# Patient Record
Sex: Female | Born: 1940 | Race: White | Hispanic: No | Smoking: Never smoker
Health system: Southern US, Community
[De-identification: ages and names within clinical notes are randomized; demographics above are authoritative.]

## PROBLEM LIST (undated history)

## (undated) DIAGNOSIS — G252 Other specified forms of tremor: Secondary | ICD-10-CM

## (undated) DIAGNOSIS — I739 Peripheral vascular disease, unspecified: Secondary | ICD-10-CM

## (undated) DIAGNOSIS — I10 Essential (primary) hypertension: Secondary | ICD-10-CM

## (undated) DIAGNOSIS — K219 Gastro-esophageal reflux disease without esophagitis: Secondary | ICD-10-CM

## (undated) DIAGNOSIS — U071 COVID-19: Secondary | ICD-10-CM

## (undated) DIAGNOSIS — K222 Esophageal obstruction: Secondary | ICD-10-CM

## (undated) DIAGNOSIS — K2970 Gastritis, unspecified, without bleeding: Secondary | ICD-10-CM

## (undated) DIAGNOSIS — M545 Low back pain, unspecified: Secondary | ICD-10-CM

## (undated) DIAGNOSIS — M797 Fibromyalgia: Secondary | ICD-10-CM

## (undated) DIAGNOSIS — A0472 Enterocolitis due to Clostridium difficile, not specified as recurrent: Secondary | ICD-10-CM

## (undated) DIAGNOSIS — R413 Other amnesia: Secondary | ICD-10-CM

## (undated) DIAGNOSIS — C801 Malignant (primary) neoplasm, unspecified: Secondary | ICD-10-CM

## (undated) DIAGNOSIS — J189 Pneumonia, unspecified organism: Secondary | ICD-10-CM

## (undated) DIAGNOSIS — K279 Peptic ulcer, site unspecified, unspecified as acute or chronic, without hemorrhage or perforation: Secondary | ICD-10-CM

## (undated) DIAGNOSIS — B9681 Helicobacter pylori [H. pylori] as the cause of diseases classified elsewhere: Secondary | ICD-10-CM

## (undated) DIAGNOSIS — M479 Spondylosis, unspecified: Secondary | ICD-10-CM

## (undated) HISTORY — DX: Low back pain: M54.5

## (undated) HISTORY — DX: Spondylosis, unspecified: M47.9

## (undated) HISTORY — DX: Esophageal obstruction: K22.2

## (undated) HISTORY — DX: Fibromyalgia: M79.7

## (undated) HISTORY — DX: Peripheral vascular disease, unspecified: I73.9

## (undated) HISTORY — DX: Peptic ulcer, site unspecified, unspecified as acute or chronic, without hemorrhage or perforation: K27.9

## (undated) HISTORY — DX: Helicobacter pylori (H. pylori) as the cause of diseases classified elsewhere: B96.81

## (undated) HISTORY — DX: Other specified forms of tremor: G25.2

## (undated) HISTORY — DX: COVID-19: U07.1

## (undated) HISTORY — DX: Helicobacter pylori (H. pylori) as the cause of diseases classified elsewhere: K29.70

## (undated) HISTORY — DX: Low back pain, unspecified: M54.50

## (undated) HISTORY — DX: Enterocolitis due to Clostridium difficile, not specified as recurrent: A04.72

---
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
## 1972-08-12 HISTORY — PX: BREAST ENHANCEMENT SURGERY: SHX7

## 1982-08-12 HISTORY — PX: TUBAL LIGATION: SHX77

## 1983-08-13 HISTORY — PX: ABDOMINAL HYSTERECTOMY: SHX81

## 1994-01-16 ENCOUNTER — Encounter: Payer: Self-pay | Admitting: Internal Medicine

## 1998-01-12 ENCOUNTER — Emergency Department (HOSPITAL_COMMUNITY): Admission: EM | Admit: 1998-01-12 | Discharge: 1998-01-12 | Payer: Self-pay | Admitting: Emergency Medicine

## 2002-12-01 ENCOUNTER — Inpatient Hospital Stay (HOSPITAL_COMMUNITY): Admission: RE | Admit: 2002-12-01 | Discharge: 2002-12-05 | Payer: Self-pay | Admitting: Neurosurgery

## 2003-03-13 HISTORY — PX: LUMBAR FUSION: SHX111

## 2003-08-15 ENCOUNTER — Encounter (INDEPENDENT_AMBULATORY_CARE_PROVIDER_SITE_OTHER): Payer: Self-pay | Admitting: *Deleted

## 2003-08-15 LAB — CONVERTED CEMR LAB

## 2004-07-19 ENCOUNTER — Ambulatory Visit: Payer: Self-pay | Admitting: Internal Medicine

## 2004-09-07 ENCOUNTER — Ambulatory Visit: Payer: Self-pay | Admitting: Internal Medicine

## 2004-09-19 ENCOUNTER — Ambulatory Visit: Payer: Self-pay | Admitting: Internal Medicine

## 2005-06-07 ENCOUNTER — Ambulatory Visit: Payer: Self-pay | Admitting: Internal Medicine

## 2005-11-05 ENCOUNTER — Ambulatory Visit: Payer: Self-pay | Admitting: Internal Medicine

## 2005-11-25 ENCOUNTER — Ambulatory Visit: Payer: Self-pay | Admitting: Internal Medicine

## 2005-12-30 ENCOUNTER — Ambulatory Visit: Payer: Self-pay | Admitting: Internal Medicine

## 2006-01-09 ENCOUNTER — Ambulatory Visit: Payer: Self-pay | Admitting: Internal Medicine

## 2006-06-13 ENCOUNTER — Ambulatory Visit: Payer: Self-pay | Admitting: Internal Medicine

## 2006-06-18 ENCOUNTER — Ambulatory Visit: Payer: Self-pay | Admitting: Internal Medicine

## 2007-05-04 ENCOUNTER — Encounter: Payer: Self-pay | Admitting: *Deleted

## 2007-05-04 DIAGNOSIS — M1990 Unspecified osteoarthritis, unspecified site: Secondary | ICD-10-CM | POA: Insufficient documentation

## 2007-05-04 DIAGNOSIS — M797 Fibromyalgia: Secondary | ICD-10-CM | POA: Insufficient documentation

## 2007-05-04 DIAGNOSIS — R1013 Epigastric pain: Secondary | ICD-10-CM

## 2007-05-04 DIAGNOSIS — K3189 Other diseases of stomach and duodenum: Secondary | ICD-10-CM | POA: Insufficient documentation

## 2007-05-04 DIAGNOSIS — I739 Peripheral vascular disease, unspecified: Secondary | ICD-10-CM | POA: Insufficient documentation

## 2007-05-04 DIAGNOSIS — M545 Low back pain: Secondary | ICD-10-CM

## 2007-05-26 ENCOUNTER — Ambulatory Visit: Payer: Self-pay | Admitting: Pulmonary Disease

## 2007-06-10 ENCOUNTER — Ambulatory Visit: Payer: Self-pay | Admitting: Internal Medicine

## 2007-08-18 ENCOUNTER — Telehealth: Payer: Self-pay | Admitting: Internal Medicine

## 2007-10-05 ENCOUNTER — Telehealth: Payer: Self-pay | Admitting: Internal Medicine

## 2007-12-07 ENCOUNTER — Ambulatory Visit: Payer: Self-pay | Admitting: Internal Medicine

## 2007-12-28 ENCOUNTER — Telehealth: Payer: Self-pay | Admitting: Internal Medicine

## 2007-12-28 DIAGNOSIS — R109 Unspecified abdominal pain: Secondary | ICD-10-CM | POA: Insufficient documentation

## 2008-01-05 ENCOUNTER — Ambulatory Visit: Payer: Self-pay | Admitting: Internal Medicine

## 2008-01-05 ENCOUNTER — Encounter: Payer: Self-pay | Admitting: Internal Medicine

## 2008-01-14 ENCOUNTER — Encounter: Payer: Self-pay | Admitting: Internal Medicine

## 2008-01-21 DIAGNOSIS — K5909 Other constipation: Secondary | ICD-10-CM | POA: Insufficient documentation

## 2008-01-21 DIAGNOSIS — Z8719 Personal history of other diseases of the digestive system: Secondary | ICD-10-CM | POA: Insufficient documentation

## 2008-01-21 DIAGNOSIS — K279 Peptic ulcer, site unspecified, unspecified as acute or chronic, without hemorrhage or perforation: Secondary | ICD-10-CM | POA: Insufficient documentation

## 2008-01-25 ENCOUNTER — Ambulatory Visit: Payer: Self-pay | Admitting: Internal Medicine

## 2008-01-25 DIAGNOSIS — R1013 Epigastric pain: Secondary | ICD-10-CM | POA: Insufficient documentation

## 2008-02-24 ENCOUNTER — Ambulatory Visit: Payer: Self-pay | Admitting: Internal Medicine

## 2008-02-24 LAB — HM COLONOSCOPY

## 2008-02-26 ENCOUNTER — Ambulatory Visit (HOSPITAL_COMMUNITY): Admission: RE | Admit: 2008-02-26 | Discharge: 2008-02-26 | Payer: Self-pay | Admitting: Internal Medicine

## 2008-06-10 ENCOUNTER — Encounter: Payer: Self-pay | Admitting: Internal Medicine

## 2008-06-20 ENCOUNTER — Telehealth: Payer: Self-pay | Admitting: Internal Medicine

## 2008-07-01 ENCOUNTER — Telehealth: Payer: Self-pay | Admitting: Internal Medicine

## 2008-07-06 ENCOUNTER — Telehealth: Payer: Self-pay | Admitting: Internal Medicine

## 2008-08-08 ENCOUNTER — Telehealth: Payer: Self-pay | Admitting: Internal Medicine

## 2008-08-17 ENCOUNTER — Encounter: Payer: Self-pay | Admitting: Internal Medicine

## 2008-08-18 ENCOUNTER — Telehealth: Payer: Self-pay | Admitting: Internal Medicine

## 2008-08-22 ENCOUNTER — Ambulatory Visit: Payer: Self-pay | Admitting: Internal Medicine

## 2008-08-22 DIAGNOSIS — G250 Essential tremor: Secondary | ICD-10-CM | POA: Insufficient documentation

## 2008-08-22 DIAGNOSIS — G252 Other specified forms of tremor: Secondary | ICD-10-CM

## 2008-12-26 ENCOUNTER — Telehealth: Payer: Self-pay | Admitting: Internal Medicine

## 2009-02-10 ENCOUNTER — Telehealth: Payer: Self-pay | Admitting: Internal Medicine

## 2009-03-09 ENCOUNTER — Telehealth: Payer: Self-pay | Admitting: Internal Medicine

## 2009-03-19 ENCOUNTER — Observation Stay (HOSPITAL_COMMUNITY): Admission: EM | Admit: 2009-03-19 | Discharge: 2009-03-20 | Payer: Self-pay | Admitting: Emergency Medicine

## 2009-03-21 ENCOUNTER — Telehealth: Payer: Self-pay | Admitting: Internal Medicine

## 2009-03-27 ENCOUNTER — Ambulatory Visit: Payer: Self-pay | Admitting: Internal Medicine

## 2009-03-27 LAB — CONVERTED CEMR LAB
Basophils Absolute: 0 10*3/uL (ref 0.0–0.1)
Basophils Relative: 0.7 % (ref 0.0–3.0)
Eosinophils Absolute: 0.2 10*3/uL (ref 0.0–0.7)
Eosinophils Relative: 2.9 % (ref 0.0–5.0)
HCT: 34.1 % — ABNORMAL LOW (ref 36.0–46.0)
Hemoglobin: 12.1 g/dL (ref 12.0–15.0)
Lymphocytes Relative: 35.6 % (ref 12.0–46.0)
Lymphs Abs: 2 10*3/uL (ref 0.7–4.0)
MCHC: 35.3 g/dL (ref 30.0–36.0)
MCV: 89.6 fL (ref 78.0–100.0)
Monocytes Absolute: 0.5 10*3/uL (ref 0.1–1.0)
Monocytes Relative: 9.4 % (ref 3.0–12.0)
Neutro Abs: 2.8 10*3/uL (ref 1.4–7.7)
Neutrophils Relative %: 51.4 % (ref 43.0–77.0)
Platelets: 319 10*3/uL (ref 150.0–400.0)
RBC: 3.81 M/uL — ABNORMAL LOW (ref 3.87–5.11)
RDW: 12.6 % (ref 11.5–14.6)
WBC: 5.5 10*3/uL (ref 4.5–10.5)

## 2009-04-11 ENCOUNTER — Ambulatory Visit: Payer: Self-pay | Admitting: Internal Medicine

## 2009-05-04 ENCOUNTER — Ambulatory Visit: Payer: Self-pay | Admitting: Internal Medicine

## 2009-05-04 LAB — CONVERTED CEMR LAB
Folate: >20 ng/mL
Free T4: 0.8 ng/dL (ref 0.6–1.6)
Hgb A1c MFr Bld: 5.8 % (ref 4.6–6.5)
Iron: 78 ug/dL (ref 42–145)
Saturation Ratios: 25.5 % (ref 20.0–50.0)
TSH: 3.04 microintl units/mL (ref 0.35–5.50)
Transferrin: 218.9 mg/dL (ref 212.0–360.0)
Vit D, 25-Hydroxy: 39 ng/mL (ref 30–89)
Vitamin B-12: 585 pg/mL (ref 211–911)

## 2009-05-06 ENCOUNTER — Encounter: Payer: Self-pay | Admitting: Internal Medicine

## 2009-06-19 ENCOUNTER — Ambulatory Visit: Payer: Self-pay | Admitting: Internal Medicine

## 2009-07-21 ENCOUNTER — Telehealth: Payer: Self-pay | Admitting: Internal Medicine

## 2009-08-31 ENCOUNTER — Ambulatory Visit: Payer: Self-pay | Admitting: Internal Medicine

## 2009-10-09 ENCOUNTER — Telehealth: Payer: Self-pay | Admitting: Internal Medicine

## 2009-10-18 ENCOUNTER — Encounter: Payer: Self-pay | Admitting: Internal Medicine

## 2009-10-18 LAB — HM MAMMOGRAPHY: HM Mammogram: NORMAL

## 2009-10-20 ENCOUNTER — Telehealth: Payer: Self-pay | Admitting: Internal Medicine

## 2009-11-02 ENCOUNTER — Telehealth: Payer: Self-pay | Admitting: Internal Medicine

## 2009-11-09 ENCOUNTER — Telehealth: Payer: Self-pay | Admitting: Internal Medicine

## 2010-01-05 ENCOUNTER — Telehealth: Payer: Self-pay | Admitting: Internal Medicine

## 2010-01-23 ENCOUNTER — Ambulatory Visit: Payer: Self-pay | Admitting: Internal Medicine

## 2010-01-23 LAB — CONVERTED CEMR LAB
BUN: 17 mg/dL (ref 6–23)
Basophils Absolute: 0 10*3/uL (ref 0.0–0.1)
Basophils Relative: 0.3 % (ref 0.0–3.0)
CO2: 30 meq/L (ref 19–32)
CRP, High Sensitivity: 0.5 (ref 0.00–5.00)
Calcium: 9.6 mg/dL (ref 8.4–10.5)
Chloride: 101 meq/L (ref 96–112)
Creatinine, Ser: 0.7 mg/dL (ref 0.4–1.2)
Eosinophils Absolute: 0.1 10*3/uL (ref 0.0–0.7)
Eosinophils Relative: 1.2 % (ref 0.0–5.0)
Folate: >20 ng/mL
GFR calc non Af Amer: 91.21 mL/min (ref >60)
Glucose, Bld: 81 mg/dL (ref 70–99)
HCT: 36.7 % (ref 36.0–46.0)
Hemoglobin: 12.6 g/dL (ref 12.0–15.0)
Lymphocytes Relative: 21.8 % (ref 12.0–46.0)
Lymphs Abs: 2 10*3/uL (ref 0.7–4.0)
MCHC: 34.4 g/dL (ref 30.0–36.0)
MCV: 91.8 fL (ref 78.0–100.0)
Monocytes Absolute: 0.5 10*3/uL (ref 0.1–1.0)
Monocytes Relative: 5.6 % (ref 3.0–12.0)
Neutro Abs: 6.5 10*3/uL (ref 1.4–7.7)
Neutrophils Relative %: 71.1 % (ref 43.0–77.0)
Platelets: 273 10*3/uL (ref 150.0–400.0)
Potassium: 4.3 meq/L (ref 3.5–5.1)
RBC: 4 M/uL (ref 3.87–5.11)
RDW: 13.3 % (ref 11.5–14.6)
Sodium: 141 meq/L (ref 135–145)
TSH: 3.34 microintl units/mL (ref 0.35–5.50)
Vitamin B-12: 502 pg/mL (ref 211–911)
WBC: 9.2 10*3/uL (ref 4.5–10.5)

## 2010-01-29 ENCOUNTER — Encounter: Payer: Self-pay | Admitting: Internal Medicine

## 2010-05-03 ENCOUNTER — Ambulatory Visit: Payer: Self-pay | Admitting: Internal Medicine

## 2010-05-03 ENCOUNTER — Telehealth: Payer: Self-pay | Admitting: Internal Medicine

## 2010-06-11 ENCOUNTER — Encounter: Payer: Self-pay | Admitting: Internal Medicine

## 2010-07-09 ENCOUNTER — Telehealth: Payer: Self-pay | Admitting: Internal Medicine

## 2010-09-11 NOTE — Letter (Signed)
[facility] [address] [place], [place]  [address] Phone: [phone]      January 29, 2010   [facility] [address] [place], [place] [address]  RE:  LAB RESULTS  Dear  [name],  The following is an interpretation of your most recent lab tests.  Please take note of any instructions provided or changes to medications that have resulted from your lab work.  ELECTROLYTES:  Good - no changes needed  KIDNEY FUNCTION TESTS:  Good - no changes needed   DIABETIC STUDIES:  Good - no changes needed Blood Glucose: 81   HgbA1C: 5.8     CBC:  Good - no changes needed  B12, sed rate, c-reactive protein all normal.   the labs are normal. No physiologic explanation for your fatigue. I hope you are feeling better. Don't forget to call Victorino Dike at [phone].   Sincerely Yours,    Jacques Navy MD  Appended Document:  Patient: [name] Note: All result statuses are Final unless otherwise noted.  Tests: (1) B12 + Folate Panel (B12/FOL)   Vitamin B12               502 pg/mL                   211-911   Folate                    >20.0 ng/mL     Deficient  0.4 - 3.4 ng/mL     Indeterminate  3.4 - 5.4 ng/mL     Normal  >5.4 ng/mL  Tests: (2) TSH (TSH)   FastTSH                   3.34 uIU/mL                 0.35-5.50  Tests: (3) CBC Platelet w/Diff (CBCD)   White Cell Count          9.2 K/uL                    4.5-10.5   Red Cell Count            4.00 Mil/uL                 3.87-5.11   Hemoglobin                12.6 g/dL                   52.8-41.3   Hematocrit                36.7 %                      36.0-46.0   MCV                       91.8 fl                     78.0-100.0   MCHC                      34.4 g/dL                   24.4-01.0   RDW  13.3 %                      11.5-14.6   Platelet Count            273.0 K/uL                  150.0-400.0   Neutrophil %              71.1 %                      43.0-77.0  Lymphocyte %              21.8 %                      12.0-46.0   Monocyte %                5.6 %                       3.0-12.0   Eosinophils%              1.2 %                       0.0-5.0   Basophils %               0.3 %                       0.0-3.0   Neutrophill Absolute      6.5 K/uL                    1.4-7.7   Lymphocyte Absolute       2.0 K/uL                    0.7-4.0   Monocyte Absolute         0.5 K/uL                    0.1-1.0  Eosinophils, Absolute                             0.1 K/uL                    0.0-0.7   Basophils Absolute        0.0 K/uL                    0.0-0.1  Tests: (4) BMP (METABOL)   Sodium                    141 mEq/L                   135-145   Potassium                 4.3 mEq/L                   3.5-5.1   Chloride                  101 mEq/L                   96-112   Carbon Dioxide            30 mEq/L  19-32   Glucose                   81 mg/dL                    45-40   BUN                       17 mg/dL                    9-81   Creatinine                0.7 mg/dL                   1.9-1.4   Calcium                   9.6 mg/dL                   7.8-29.5   GFR                       91.21 mL/min                >60  Tests: (5) Full Range CRP (FCRP)   CRPH                      0.50 mg/L                   0.00-5.00

## 2010-09-11 NOTE — Progress Notes (Signed)
  Phone Note From Other Clinic   Summary of Call: Pt needs cxr & labs prior to office visit 8/16 per dr. CXR & labs in IDX & EMR Initial call taken by: Lamar Sprinkles,  March 21, 2009 4:42 PM

## 2010-09-11 NOTE — Progress Notes (Signed)
Summary: REFILLS  Phone Note Refill Request Message from:  Fax from Pharmacy on May 03, 2010 1:49 PM  Refills Requested: Medication #1:  TRAMADOL HCL 50 MG  TABS 1-2 every 8 hours  Medication #2:  AMITRIPTYLINE HCL 50 MG  TABS at bedtime Please Advise refill for pt  Initial call taken by: Ami Bullins CMA,  May 03, 2010 1:50 PM  Follow-up for Phone Call        ok for refills x 5 Follow-up by: Jacques Navy MD,  May 04, 2010 6:23 PM    Prescriptions: TRAMADOL HCL 50 MG  TABS (TRAMADOL HCL) 1-2 every 8 hours  #180 x 5   Entered by:   Lamar Sprinkles, CMA   Authorized by:   Jacques Navy MD   Signed by:   Lamar Sprinkles, CMA on 05/04/2010   Method used:   Telephoned to ...       [id number]* (retail)       [address]       [place], [place]  [address]       Ph: [phone]       Fax: [phone]   RxID:   [id number] AMITRIPTYLINE HCL 50 MG  TABS (AMITRIPTYLINE HCL) at bedtime  #30 x 5   Entered by:   Lamar Sprinkles, CMA   Authorized by:   Jacques Navy MD   Signed by:   Lamar Sprinkles, CMA on 05/04/2010   Method used:   Telephoned to ...       [facility] #[id number]* (retail)       [address]       [place], [place]  [address]       Ph: [phone]       Fax: [phone]   RxID:   [id number]

## 2010-09-11 NOTE — Progress Notes (Signed)
    Preventive Care Screening  Mammogram:    Date:  10/18/2009    Results:  normal bilateral

## 2010-09-11 NOTE — Progress Notes (Signed)
Summary: Med change  Phone Note Call from Patient   Summary of Call: Pt dropped of form from insurance, Dr Debby Bud chose omeprazole over generic protonix b/c insurance wil not cover gen protonix.  Covered meds are protonix brand & omeprazole. Rx sent into pharm. left mess to call office back to inform pt.  Initial call taken by: Lamar Sprinkles,  July 06, 2008 10:38 AM  Follow-up for Phone Call        Called pt/female voice D/c phone. rx already sent to pharmacy Follow-up by: Rock Nephew CMA,  July 11, 2008 1:38 PM    New/Updated Medications: OMEPRAZOLE 20 MG TBEC (OMEPRAZOLE) 2 by mouth qam   Prescriptions: OMEPRAZOLE 20 MG TBEC (OMEPRAZOLE) 2 by mouth qam  #60 x 6   Entered by:   Lamar Sprinkles   Authorized by:   Jacques Navy MD   Signed by:   Lamar Sprinkles on 07/06/2008   Method used:   Electronically to        [facility] #[id number]* (retail)       [address]       [place], [place]  [address]       Ph: [phone]       Fax: [phone]   RxID:   [id number]

## 2010-09-11 NOTE — Progress Notes (Signed)
Summary: form to be completed  Phone Note Call from Patient Call back at [facility] Phone [phone]   Summary of Call: Pt left form from [employer] Complete for Dr Debby Bud to fill out and fax regarding her current medication;Pantoprazole. Form left on counter by triage A. Initial call taken by: Verdell Face,  July 01, 2008 12:04 PM  Follow-up for Phone Call        Form on dr's desk to complete Follow-up by: Lamar Sprinkles,  July 04, 2008 6:26 PM  Additional Follow-up for Phone Call Additional follow up Details #1::        Noted. Will get to it as soon as possible. Additional Follow-up by: Jacques Navy MD,  July 05, 2008 10:08 AM

## 2010-09-11 NOTE — Progress Notes (Signed)
Summary: refill request  Phone Note Refill Request Message from:  Pharmacy on March 09, 2009 4:53 PM  Refills Requested: Medication #1:  AMITRIPTYLINE HCL 50 MG  TABS at bedtime   Last Refilled: 02/10/2009   Notes: rx written 02-10-2009 #30x0  Pharmacy requesting refill. Last Office Visit 08-22-2008. Okay for refill?  [facility] #[id number] [phone]    Initial call taken by: Beola Cord, CMA,  March 09, 2009 4:55 PM    Prescriptions: AMITRIPTYLINE HCL 50 MG  TABS (AMITRIPTYLINE HCL) at bedtime  #30 x 12   Entered by:   Lamar Sprinkles   Authorized by:   Jacques Navy MD   Signed by:   Lamar Sprinkles on 03/10/2009   Method used:   Electronically to        [facility] #[id number]* (retail)       [address]       [place], [place]  [address]       Ph: [phone]       Fax: [phone]   RxID:   [id number]

## 2010-09-11 NOTE — Progress Notes (Signed)
Summary: stomach issue  Phone Note Call from Patient Call back at Home Phone [phone]   Summary of Call: Patient left message on triage today stating that she continues to have stomach issues. MD previously told her to take additional meds, but now her symptoms are returning and it seems to be a recurring problem. Patient ? if she should come in for office visit or be referred to GI. Please advise.  *Patient requests that we call back to her home and leave message on machine for her, she will most likely not be home. Initial call taken by: Lucious Groves,  November 09, 2009 3:51 PM  Follow-up for Phone Call        reviewed last note - carafate hd been added to regimen. plan was refer to GI if symptoms continued. [facility] notified Follow-up by: Jacques Navy MD,  November 09, 2009 4:01 PM  Additional Follow-up for Phone Call Additional follow up Details #1::        Called patient/ LMOVM advising of referral and [facility] will notify of appt Additional Follow-up by: Rock Nephew CMA,  November 09, 2009 4:31 PM

## 2010-09-11 NOTE — Miscellaneous (Signed)
Summary: BONE DENSITY  Clinical Lists Changes  Orders: Added new Test order of T-Bone Densitometry (77080) - Signed Added new Test order of T-Lumbar Vertebral Assessment (77082) - Signed 

## 2010-09-11 NOTE — Assessment & Plan Note (Signed)
Summary: FU-$50-STC   Vital Signs:  Patient Profile:   70 Years Old Female Height:     62 inches Weight:      136.5 pounds Temp:     97.6 degrees F oral Pulse rate:   64 / minute BP sitting:   144 / 78  (left arm) Cuff size:   regular  Vitals Entered By: Zackery Barefoot CMA (December 07, 2007 3:43 PM)                 PCP:  Syria Kestner  Chief Complaint:  Abdominal discomfort x 1 month/bloating .  History of Present Illness: A one month history of stomach pain in the epigastrum. Taking antacid/H2 blocker which help. No nausea or vomiting, no severe pain. No change in stools.    Updated Prior Medication List: MULTIVITAMINS   TABS (MULTIPLE VITAMIN) once daily CALCIUM 500 500 MG  TABS (CALCIUM CARBONATE) Take 1 tablet by mouth once a day MIRALAX   POWD (POLYETHYLENE GLYCOL 3350) 1 capful once daily TRAMADOL HCL 50 MG  TABS (TRAMADOL HCL) 1-2 every 8 hours MELOXICAM 15 MG  TABS (MELOXICAM) 1 by mouth once daily AMITRIPTYLINE HCL 50 MG  TABS (AMITRIPTYLINE HCL) at bedtime XANAX 0.25 MG  TABS (ALPRAZOLAM) as needed  Current Allergies: No known allergies   Past Medical History:    Reviewed history and no changes required:        PVD (ICD-443.9)       LUMBAGO (ICD-724.2)       OSTEOARTHRITIS, BACK (ICD-715.98)       DYSPEPSIA (ICD-536.8)       FIBROMYALGIA (ICD-729.1)         Past Surgical History:    Reviewed history from 05/04/2007 and no changes required:       Lumbar laminectomy       Lumbar fusion       Tubal ligation       Tonsillectomy       Breast Augmentation       Hysterectomy      Physical Exam  General:     Well-developed,well-nourished,in no acute distress; alert,appropriate and cooperative throughout examination Head:     Normocephalic and atraumatic without obvious abnormalities. No apparent alopecia or balding. Eyes:     vision grossly intact and pupils equal.   Mouth:     good dentition, no dental plaque, pharynx pink and moist, and no  posterior lymphoid hypertrophy.   Neck:     supple.   Lungs:     normal respiratory effort, no intercostal retractions, and normal breath sounds.   Heart:     normal rate, regular rhythm, and no murmur.   Abdomen:     tender in the epigastrum, no guarding or rebound. Msk:     normal ROM.  som eMCP inlargment c/w OA. Nl back exam with stand, toe/heel walk, flex to 180 degrees, normal step up, nl DTR at patella, nl SLR in the sitting position, nl sensation to light touch, pin-prick and deep vibration.  Pulses:     R and L carotid,radial,femoral,dorsalis pedis and posterior tibial pulses are full and equal bilaterally    Impression & Recommendations:  Problem # 1:  DYSPEPSIA (ICD-536.8) Flare of dyspepsia to suggest gastritis. No evidence of frank ulcer.  Plan: change diclofenac to meloxicam for reduced insult. Protonix 40mg  qAM and  continue for 1 month then switch to OTC ranitidine 150mg  two times a day. Call if not better.  Problem # 2:  LUMBAGO (ICD-724.2)  Normal exam with no evidence of nerve impingement. No indication for MRI or NS consult.  Plan: continue pain management regimen and activity.  Her updated medication list for this problem includes:    Tramadol Hcl 50 Mg Tabs (Tramadol hcl) .Marland Kitchen... 1-2 every 8 hours    Meloxicam 15 Mg Tabs (Meloxicam) .Marland Kitchen... 1 by mouth once daily   Problem # 3:  FIBROMYALGIA (ICD-729.1) Patient reports that symptoms are better. She is not having as much muscle pain as in the past.  Plan: drug holiday from amytriptyline. If no increase in discomfort or sleep disturbance may leave it off.  Her updated medication list for this problem includes:    Tramadol Hcl 50 Mg Tabs (Tramadol hcl) .Marland Kitchen... 1-2 every 8 hours    Meloxicam 15 Mg Tabs (Meloxicam) .Marland Kitchen... 1 by mouth once daily   Complete Medication List: 1)  Multivitamins Tabs (Multiple vitamin) .... Once daily 2)  Calcium 500 500 Mg Tabs (Calcium carbonate) .... Take 1 tablet by mouth once a day  3)  Miralax Powd (Polyethylene glycol 3350) .Marland Kitchen.. 1 capful once daily 4)  Tramadol Hcl 50 Mg Tabs (Tramadol hcl) .Marland Kitchen.. 1-2 every 8 hours 5)  Meloxicam 15 Mg Tabs (Meloxicam) .Marland Kitchen.. 1 by mouth once daily 6)  Amitriptyline Hcl 50 Mg Tabs (Amitriptyline hcl) .... At bedtime 7)  Xanax 0.25 Mg Tabs (Alprazolam) .... As needed 8)  Pantoprazole Sodium 40 Mg Tbec (Pantoprazole sodium) .Marland Kitchen.. 1 by mouth qam     Prescriptions: PANTOPRAZOLE SODIUM 40 MG  TBEC (PANTOPRAZOLE SODIUM) 1 by mouth qAM  #30 x 1   Entered and Authorized by:   Jacques Navy MD   Signed by:   Jacques Navy MD on 12/07/2007   Method used:   Electronically sent to ...       [id number]*       [address]       [place], [place]  [address]       Ph: [phone]       Fax: [phone]   RxID:   [id number] MELOXICAM 15 MG  TABS (MELOXICAM) 1 by mouth once daily  #30 x PRN   Entered and Authorized by:   Jacques Navy MD   Signed by:   Jacques Navy MD on 12/07/2007   Method used:   Electronically sent to ...       [facility] #[facility]*       [address]       [place], [place]  [address]       Ph: [phone]       Fax: [phone]   RxID:   [id number]  ]

## 2010-09-11 NOTE — Progress Notes (Signed)
Summary: Sucralfate update  Phone Note Call from Patient Call back at Home Phone [phone]   Summary of Call: Patient left message on triage with update. Patient has been taking the Generic Carafate and notes that all stomach issues have stopped. Patient would like to know long to continue the med or if she should stop it. Please advise. Initial call taken by: Lucious Groves,  October 09, 2009 4:23 PM  Follow-up for Phone Call        OK to stop the sucrulfate, continue omeprazole. Follow-up by: Jacques Navy MD,  October 09, 2009 4:34 PM  Additional Follow-up for Phone Call Additional follow up Details #1::        Pt informed  Additional Follow-up by: Lamar Sprinkles, CMA,  October 09, 2009 4:38 PM

## 2010-09-11 NOTE — Assessment & Plan Note (Signed)
Summary: exhausted easily/#/cd   Vital Signs:  Patient profile:   70 year old female Height:      62 inches Weight:      138 pounds BMI:     25.33 O2 Sat:      99 % on Room air Temp:     97.8 degrees F oral Pulse rate:   72 / minute BP sitting:   126 / 82  (left arm) Cuff size:   regular  Vitals Entered By: Bill Salinas CMA (January 23, 2010 11:02 AM)  O2 Flow:  Room air CC: pt here for evaluation on chronic fatigue/ ab   Primary Care Provider:  Illene Regulus MD  CC:  pt here for evaluation on chronic fatigue/ ab.  History of Present Illness: Patient presents for evaluation of malaise and fatigue that has been going on for several weeks. She has a new job tht requires 25-30 hrs a week but this isn't new. She has not been exercising. She is having increased back pain, including pain at night. She has DOE with decreased exercise tolerance. There several stressors: financial, SO with vision problems, care-taking responsibilities. She denies F/S/C, no change in bowel habits, occasional night sweats, no lymphadenopathy, chest pain.  Current Medications (verified): 1)  Multivitamins   Tabs (Multiple Vitamin) .... Once Daily 2)  Calcium 500 500 Mg  Tabs (Calcium Carbonate) .... Take 1 Tablet By Mouth Once A Day 3)  Miralax   Powd (Polyethylene Glycol 3350) .Marland Kitchen.. 1 Capful Once Daily 4)  Tramadol Hcl 50 Mg  Tabs (Tramadol Hcl) .Marland Kitchen.. 1-2 Every 8 Hours 5)  Meloxicam 15 Mg  Tabs (Meloxicam) .Marland Kitchen.. 1 By Mouth Once Daily 6)  Amitriptyline Hcl 50 Mg  Tabs (Amitriptyline Hcl) .... At Bedtime 7)  Xanax 0.25 Mg  Tabs (Alprazolam) .... Take 1-2 Tablet By Mouth Every 8 Hours As Needed 8)  Omeprazole 20 Mg Tbec (Omeprazole) .... 2 By Mouth Qam 9)  Omega-3 350 Mg Caps (Omega-3 Fatty Acids) .... 3 Daily  Allergies (verified): No Known Drug Allergies  Past History:  Past Medical History: Last updated: 08/31/2009 Current Problems:  INTENTION TREMOR (ICD-333.1) SCREENING COLORECTAL-CANCER  (ICD-V76.51) ABDOMINAL PAIN-EPIGASTRIC (ICD-789.06) CONSTIPATION (ICD-564.00) PUD (ICD-533.90) HELICOBACTER PYLORI GASTRITIS, HX OF (ICD-V12.79) CONSTIPATION, CHRONIC (ICD-564.09) ABDOMINAL PAIN (ICD-789.00) PVD (ICD-443.9) LUMBAGO (ICD-724.2) OSTEOARTHRITIS, BACK (ICD-715.98) DYSPEPSIA (ICD-536.8) FIBROMYALGIA (ICD-729.1)  Past Surgical History: Last updated: 05/04/2007 Lumbar laminectomy Lumbar fusion Tubal ligation Tonsillectomy Breast Augmentation Hysterectomy  Family History: Last updated: 01/25/2008 Family History of Diabetes: father daughter Family History of Heart Disease: mother father brother  Social History: Married - 2nd marriage '76 Patient has never smoked.  Alcohol Use - no Daily Caffeine Use 3/4 cups coffee daily Illicit Drug Use - no Patient gets regular exercise. 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    Physical Exam  General:  WNWD white female in no acute distress Head:  normocephalic and atraumatic.   Eyes:  vision grossly intact, pupils equal, pupils round, corneas and lenses clear, and no injection.   Ears:  R ear normal and L ear normal.   Mouth:  posterior pharynx clear, no oral lesions Neck:  supple and no thyromegaly.   Lungs:  normal respiratory effort and normal breath sounds.  Heart:  normal rate and regular rhythm.   Abdomen:  non-tender.   Msk:  no joint tenderness, no joint swelling, no redness over joints, and no joint deformities.   Pulses:  2+ radial  Extremities:  no edema on exam today Neurologic:  alert & oriented X3, cranial nerves II-XII intact, strength normal in all extremities, and  gait normal.     Impression & Recommendations:  Problem # 1:  OTHER MALAISE AND FATIGUE (ICD-780.79) Patient with generalized fatigue. There are several stressors at work.  Plan - r/o metabolic derangement - labs to includee B12, TSH, Bmet, CRP, CBC           consider short term counseling - provided # [facility] - Judithe Modest or Sloan Leiter.   Orders: TLB-B12 + Folate Pnl (82746_82607-B12/FOL) TLB-TSH (Thyroid Stimulating Hormone) (84443-TSH) TLB-CBC Platelet - w/Differential (85025-CBCD) TLB-BMP (Basic Metabolic Panel-BMET) (80048-METABOL) TLB-CRP-High Sensitivity (C-Reactive Protein) (86140-FCRP)  Problem # 2:  OSTEOARTHRITIS, BACK (ICD-715.98) Encouraged her to resume walking and to consider any stretch/flex exercise program that may be available.   Her updated medication list for this problem includes:    Tramadol Hcl 50 Mg Tabs (Tramadol hcl) .Marland Kitchen... 1-2 every 8 hours    Meloxicam 15 Mg Tabs (Meloxicam) .Marland Kitchen... 1 by mouth once daily  Complete Medication List: 1)  Multivitamins Tabs (Multiple vitamin) .... Once daily 2)  Calcium 500 500 Mg Tabs (Calcium carbonate) .... Take 1 tablet by mouth once a day 3)  Miralax Powd (Polyethylene glycol 3350) .Marland Kitchen.. 1 capful once daily 4)  Tramadol Hcl 50 Mg Tabs (Tramadol hcl) .Marland Kitchen.. 1-2 every 8 hours 5)  Meloxicam 15 Mg Tabs (Meloxicam) .Marland Kitchen.. 1 by mouth once daily 6)  Amitriptyline Hcl 50 Mg Tabs (Amitriptyline hcl) .... At bedtime 7)  Xanax 0.25 Mg Tabs (Alprazolam) .... Take 1-2 tablet by mouth every 8 hours as needed 8)  Omeprazole 20 Mg Tbec (Omeprazole) .... 2 by mouth qam 9)  Omega-3 350 Mg Caps (Omega-3 fatty acids) .... 3 daily Prescriptions: XANAX 0.25 MG  TABS (ALPRAZOLAM) Take 1-2 tablet by mouth every 8 hours as needed  #100 x 5   Entered and Authorized by:   Jacques Navy MD   Signed by:   Jacques Navy MD on 01/23/2010   Method used:   Handwritten   RxID:   [id number]

## 2010-09-11 NOTE — Progress Notes (Signed)
  Phone Note Refill Request Message from:  Fax from Pharmacy on November 02, 2009 9:52 AM  Refills Requested: Medication #1:  TRAMADOL HCL 50 MG  TABS 1-2 every 8 hours please advise refill for pt  Initial call taken by: Ami Bullins CMA,  November 02, 2009 9:52 AM    New/Updated Medications: TRAMADOL HCL 50 MG  TABS (TRAMADOL HCL) 1-2 every 8 hours Prescriptions: TRAMADOL HCL 50 MG  TABS (TRAMADOL HCL) 1-2 every 8 hours  #180 x 1   Entered and Authorized by:   Corwin Levins MD   Signed by:   Corwin Levins MD on 11/02/2009   Method used:   Electronically to        [id number]* (retail)       [address]       [place], [place]  [address]       Ph: [phone]       Fax: [phone]   RxID:   [id number]  done escript Corwin Levins MD  November 02, 2009 12:51 PM

## 2010-09-11 NOTE — Assessment & Plan Note (Signed)
Summary: recheck pneumonia/cd   Vital Signs:  Patient profile:   70 year old female Height:      62 inches Weight:      137.50 pounds BMI:     25.24 O2 Sat:      97 % on Room air Temp:     97.9 degrees F oral Pulse rate:   67 / minute BP sitting:   148 / 84  (left arm) Cuff size:   regular  Vitals Entered By: Bill Salinas CMA (May 04, 2009 1:08 PM)  O2 Flow:  Room air CC: Pt here for a recheck of her pneumonia and states she still feels fatigued, and she no longer gets annual paps/ ab   Primary Care Provider:  Illene Regulus MD  CC:  Pt here for a recheck of her pneumonia and states she still feels fatigued and and she no longer gets annual paps/ ab.  History of Present Illness: Patient with recent episode of pneumonia 6 weeks ago. She conrinues to have decreased exercise tolerance, DOE, generally low on energy. She will have nocturnal chest discomfort.   Current Medications (verified): 1)  Multivitamins   Tabs (Multiple Vitamin) .... Once Daily 2)  Calcium 500 500 Mg  Tabs (Calcium Carbonate) .... Take 1 Tablet By Mouth Once A Day 3)  Miralax   Powd (Polyethylene Glycol 3350) .Marland Kitchen.. 1 Capful Once Daily 4)  Tramadol Hcl 50 Mg  Tabs (Tramadol Hcl) .Marland Kitchen.. 1-2 Every 8 Hours 5)  Meloxicam 15 Mg  Tabs (Meloxicam) .Marland Kitchen.. 1 By Mouth Once Daily 6)  Amitriptyline Hcl 50 Mg  Tabs (Amitriptyline Hcl) .... At Bedtime 7)  Xanax 0.25 Mg  Tabs (Alprazolam) .... Take 1-2 Tablet By Mouth Every 8 Hours As Needed 8)  Omeprazole 20 Mg Tbec (Omeprazole) .... 2 By Mouth Qam 9)  Omega-3 350 Mg Caps (Omega-3 Fatty Acids) .... 3 Daily  Allergies (verified): No Known Drug Allergies PMH-FH-SH reviewed for relevance  Physical Exam  General:  WNWD white female in no distress Head:  normocephalic, atraumatic, and no abnormalities observed.   Eyes:  vision grossly intact, pupils equal, pupils round, corneas and lenses clear, and no injection.   Neck:  supple, full ROM, and no thyromegaly.   Lungs:   normal respiratory effort, normal breath sounds, no crackles, and no wheezes.   Heart:  normal rate, regular rhythm, and no murmur.   Msk:  No deformity or scoliosis noted of thoracic or lumbar spine.   Pulses:  2+ radial Neurologic:  alert & oriented X3, cranial nerves II-XII intact, and gait normal.   Skin:  turgor normal, color normal, no suspicious lesions, and no edema.   Cervical Nodes:  no anterior cervical adenopathy and no posterior cervical adenopathy.  No supra-clavicular nodes. Psych:  Oriented X3, memory intact for recent and remote, and normally interactive.     Impression & Recommendations:  Problem # 1:  OTHER MALAISE AND FATIGUE (ICD-780.79) Patient with persistent fatigue and decreased energy. No cardiac symptoms. No cough or respiratory symptoms.   Plan - labs to r/o metabolic cause of fatigue           f/u CXR to verify clearing of pneumonia/infiltrates.   Orders: TLB-B12 + Folate Pnl (16109_60454-U98/JXB) TLB-IBC Pnl (Iron/FE;Transferrin) (83550-IBC) TLB-TSH (Thyroid Stimulating Hormone) (84443-TSH) TLB-T4 (Thyrox), Free [phone]) T-Vitamin D (25-Hydroxy) [phone]) TLB-A1C / Hgb A1C (Glycohemoglobin) (83036-A1C)  Complete Medication List: 1)  Multivitamins Tabs (Multiple vitamin) .... Once daily 2)  Calcium 500 500 Mg Tabs (Calcium carbonate) .Marland KitchenMarland KitchenMarland Kitchen  Take 1 tablet by mouth once a day 3)  Miralax Powd (Polyethylene glycol 3350) .Marland Kitchen.. 1 capful once daily 4)  Tramadol Hcl 50 Mg Tabs (Tramadol hcl) .Marland Kitchen.. 1-2 every 8 hours 5)  Meloxicam 15 Mg Tabs (Meloxicam) .Marland Kitchen.. 1 by mouth once daily 6)  Amitriptyline Hcl 50 Mg Tabs (Amitriptyline hcl) .... At bedtime 7)  Xanax 0.25 Mg Tabs (Alprazolam) .... Take 1-2 tablet by mouth every 8 hours as needed 8)  Omeprazole 20 Mg Tbec (Omeprazole) .... 2 by mouth qam 9)  Omega-3 350 Mg Caps (Omega-3 fatty acids) .... 3 daily  Other Orders: T-2 View CXR (71020TC)   Preventive Care Screening  Mammogram:    Date:  12/10/2008     Results:  normal

## 2010-09-11 NOTE — Miscellaneous (Signed)
Summary: [facility]  [facility]   Imported By: Lester Beach 08/25/2008 09:53:46  _____________________________________________________________________  External Attachment:    Type:   Image     Comment:   External Document

## 2010-09-11 NOTE — Progress Notes (Signed)
Summary: Abdominal pain  Medications Added LEVSIN/SL 0.125 MG SUBL (HYOSCYAMINE SULFATE) Use 1or 2 tabs sub lingual every 4-6 hrs. as needed for stomach pain       Phone Note Call from Patient Call back at Home Phone [phone]   Caller: Patient Call For: [name]  Reason for Call: Talk to Nurse Details for Reason: TRIAGE  Summary of Call: Stomch Pns very intense and sore stomch Initial call taken by: Guadlupe Spanish [facility],  August 08, 2008 9:08 AM  Follow-up for Phone Call        Epigastric pain she had when seen in office in July 09 never completely went away.In the pm. on Christmas day she started having intense pain a level 9 after eating rich foods,raw vegetables and nuts.Has now modified her diet to more of a bland soft diet and  pain is not as intense,but it is stilll there.EGD 02/24/08 showed gerd,strictue and hiatal hernia.U.S. was negative.Is on Omeprazole 2 each am. Follow-up by: Teryl Lucy RN,  August 08, 2008 9:55 AM  Additional Follow-up for Phone Call Additional follow up Details #1::        Continue omeprazole 40 mg daily. Continue bland diet. Avoid NSAIDs if using. Levsin sublingual p.r.n. recurrent severe pain. Routine office followup in a few weeks. If significant problems in the interim she could see a GI extender, her PCP, or go to the ER. Additional Follow-up by: Hilarie Fredrickson MD,  August 08, 2008 10:33 AM    Additional Follow-up for Phone Call Additional follow up Details #2::    Pt. advised or Dr.Halston Kintz's orders.Has been using Mobic for arthritis is going to stop it and try Tylenol .Will consult Dr.Norin's if this does not relieve arthritic pain Follow-up by: Teryl Lucy RN,  August 08, 2008 10:51 AM  New/Updated Medications: LEVSIN/SL 0.125 MG SUBL (HYOSCYAMINE SULFATE) Use 1or 2 tabs sub lingual every 4-6 hrs. as needed for stomach pain   Prescriptions: LEVSIN/SL 0.125 MG SUBL (HYOSCYAMINE SULFATE) Use 1or 2 tabs sub lingual every 4-6 hrs. as  needed for stomach pain  #90 x 1   Entered by:   Teryl Lucy RN   Authorized by:   Hilarie Fredrickson MD   Signed by:   Teryl Lucy RN on 08/08/2008   Method used:   Electronically to        [id number]* (retail)       [address]       [place], [place]  [address]       Ph: [phone]       Fax: [phone]   RxID:   [id number]

## 2010-09-11 NOTE — Progress Notes (Signed)
Summary: n/v/d fever  Medications Added PROMETHAZINE HCL 12.5 MG  TABS (PROMETHAZINE HCL) 1 q 6 as needed LOMOTIL 2.5-0.025 MG TABS (DIPHENOXYLATE-ATROPINE) 1 after each loose stool, max 4 in 24 hrs       Phone Note Call from Patient Call back at [facility] Phone [phone]   Summary of Call: Pt c/o n/v/d, fever, chills, and  body aches, symptoms began yesterday. Does she need to come in or can something be called in for pt? Initial call taken by: Lamar Sprinkles,  August 18, 2007 11:29 AM  Follow-up for Phone Call        sounds viral. OK to call in 1) promethazine 12.5 mg, sig 1 every 6 hours as needed for nausea  # 20, 2) lomotil 2.5/0.25, sig 1 after each loose stool limit 4 doses/ 24 hrs  #10, 3) APAP 1000mg  every 6 hours for fever and myalgias, 4) hydrate and bland diet.  If this does not help we can see on short notice. Follow-up by: Jacques Navy MD,  August 18, 2007 12:24 PM  Additional Follow-up for Phone Call Additional follow up Details #1::        Pt informed  Additional Follow-up by: Lamar Sprinkles,  August 18, 2007 2:16 PM    New/Updated Medications: PROMETHAZINE HCL 12.5 MG  TABS (PROMETHAZINE HCL) 1 q 6 as needed LOMOTIL 2.5-0.025 MG TABS (DIPHENOXYLATE-ATROPINE) 1 after each loose stool, max 4 in 24 hrs   Prescriptions: LOMOTIL 2.5-0.025 MG TABS (DIPHENOXYLATE-ATROPINE) 1 after each loose stool, max 4 in 24 hrs  #10 x 0   Entered by:   Lamar Sprinkles   Authorized by:   Jacques Navy MD   Signed by:   Lamar Sprinkles on 08/18/2007   Method used:   Telephoned to ...       [id number]       [address]       [place], [place]  [address]       Ph: [phone]       Fax: [phone]   RxID:   [id number] PROMETHAZINE HCL 12.5 MG  TABS (PROMETHAZINE HCL) 1 q 6 as needed  #20 x 0   Entered by:   Lamar Sprinkles   Authorized by:   Jacques Navy MD   Signed by:   Lamar Sprinkles on 08/18/2007   Method used:    Telephoned to ...       [id number]*       [address]       [place], [place]  [address]       Ph: [phone]       Fax: [phone]   RxID:   [id number]

## 2010-09-11 NOTE — Progress Notes (Signed)
Summary: Stomach pain/ [name] PT  Phone Note Call from Patient   Summary of Call: Pt says that at last office visit she was given gen protonix for stomach pain. Pt c/o continued stomach pain & tenderness. What does dr suggest to do next? Initial call taken by: Lamar Sprinkles,  Dec 28, 2007 11:47 AM  Follow-up for Phone Call        for on-going pain unrelieved by PPI will refer to GI. Does she have a preferenc regarding a GI specialist. If no will refer to Dr. Marina Goodell Follow-up by: Jacques Navy MD,  Dec 28, 2007 2:31 PM  Additional Follow-up for Phone Call Additional follow up Details #1::        Please put in referral for pt to see Dr Marina Goodell Additional Follow-up by: Lamar Sprinkles,  Dec 28, 2007 4:10 PM  New Problems: ABDOMINAL PAIN (ICD-789.00)   New Problems: ABDOMINAL PAIN (ICD-789.00)

## 2010-09-11 NOTE — Progress Notes (Signed)
Summary: REFILL -TRAMADOL  Phone Note Call from Patient Call back at Home Phone [phone] Call back at [facility] VM ON HM #   Summary of Call: Pt takes 2 tabs of tramadol three times a day. She is req a refill of tramadol. Last rx was denied w/reason being too early. Last refill of #180 w/1 refill was 3/25. Ok to refill? Initial call taken by: Lamar Sprinkles, CMA,  Jan 05, 2010 10:55 AM  Follow-up for Phone Call        ok to refill x 3 Follow-up by: Jacques Navy MD,  Jan 05, 2010 1:01 PM    Prescriptions: TRAMADOL HCL 50 MG  TABS (TRAMADOL HCL) 1-2 every 8 hours  #180 x 3   Entered by:   Ami Bullins CMA   Authorized by:   Jacques Navy MD   Signed by:   Bill Salinas CMA on 01/05/2010   Method used:   Electronically to        [facility] #[id number]* (retail)       [address]       [place], [place]  [address]       Ph: [phone]       Fax: [phone]   RxID:   [id number]

## 2010-09-11 NOTE — Procedures (Signed)
Summary: EGD   EGD  Procedure date:  02/24/2008  Findings:      Location: [facility]    Patient Name: [name], [name]. MRN:  Procedure Procedures: Panendoscopy (EGD) CPT: 43235.  Personnel: Endoscopist: Wilhemina Bonito. Marina Goodell, MD.  Referred By: Rosalyn Gess. Norins, MD.  Exam Location: Exam performed in Outpatient Clinic. Outpatient  Patient Consent: Procedure, Alternatives, Risks and Benefits discussed, consent obtained, from patient. Consent was obtained by the RN.  Indications Symptoms: Abdominal pain, location: epigastric.  History  Current Medications: Patient is not currently taking Coumadin.  Pre-Exam Physical: Performed Feb 24, 2008  Cardio-pulmonary exam, HEENT exam, Abdominal exam, Mental status exam WNL.  Comments: Pt. history reviewed/updated, physical exam performed prior to initiation of sedation?yes Exam Exam Info: Maximum depth of insertion Duodenum, intended Duodenum. Patient position: on left side. Vocal cords visualized. Gastric retroflexion performed. Images taken. ASA Classification: I. Tolerance: excellent.  Sedation Meds: Patient assessed and found to be appropriate for moderate (conscious) sedation. Residual sedation present from prior procedure today. Cetacaine Spray 2 sprays given aerosolized. Versed 3 mg. given IV.  Monitoring: BP and pulse monitoring done. Oximetry used. Supplemental O2 given  Findings HIATAL HERNIA: ICD9: Hernia, Hiatal: 553.3.Comments: small.  STRICTURE / STENOSIS: Stricture in Distal Esophagus.  Constriction: partial. Etiology: benign due to reflux. 36 cm from mouth. Lumen diameter is 15 mm. ICD9: Esophageal Stricture: 530.3.  - Normal: Cardia to Duodenal 2nd Portion.   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  553.3: Hernia, Hiatal.  530.81: GERD.   Comments: NO ACTIVE ULCER DISEASE Events  Unplanned Intervention: No unplanned interventions were required.  Unplanned Events: There were  no complications. Plans Comments: CONTINUE PROTONIX Disposition: After procedure patient sent to recovery. After recovery patient sent home.  Scheduling: Ultrasound, ABDOMINAL "EPIGASTRIC PAIN"    cc:  Rosalyn Gess. Norins, MD  This report was created from the original endoscopy report, which was reviewed and signed by the above listed endoscopist.

## 2010-09-11 NOTE — Progress Notes (Signed)
Summary: [name]  Phone Note Refill Request Message from:  Fax from Pharmacy  Refills Requested: Medication #1:  XANAX 0.25 MG  TABS Take 1-2 tablet by mouth every 8 hours as needed   Last Refilled: 06/05/2010 please Advise refill  Initial call taken by: Ami Bullins CMA,  July 09, 2010 3:58 PM  Follow-up for Phone Call        ok to refill x 5 Follow-up by: Jacques Navy MD,  July 09, 2010 5:54 PM    Prescriptions: [name] 0.25 MG  TABS (ALPRAZOLAM) Take 1-2 tablet by mouth every 8 hours as needed  #100 x 5   Entered by:   Lamar Sprinkles, CMA   Authorized by:   Jacques Navy MD   Signed by:   Lamar Sprinkles, CMA on 07/10/2010   Method used:   Telephoned to ...       [id number]* (retail)       [address]       [place], [place]  [address]       Ph: [phone]       Fax: [phone]   RxID:   [id number]

## 2010-09-11 NOTE — Assessment & Plan Note (Signed)
Summary: EPIGASTRIC PN/RH    History of Present Illness Visit Type: consult Primary GI MD: Yancey Flemings MD Primary Provider: Illene Regulus MD Requesting Provider: Oliver Barre MD  Chief Complaint: abdominal pain x 2 months epigastric area History of Present Illness:   70 year old female with degenerative joint disease, fibromyalgia, chronic constipation, and peptic ulcer disease. She is sent by Dr. Debby Bud for evaluation of epigastric pain. The patient reports a 2-1/2 month history of epigastric gnawing discomfort. She was placed on daily Protonix. For about 3 weeks this seemed to help. However, symptoms returned despite medication compliance. Symptoms are worse at night, after lying down. Symptoms also worse after consuming roughage. No problems with nausea or vomiting. The pain will occasionally awaken her. Symptoms are similar to when she had active ulcer disease. She has been on nonsteroidal anti-inflammatory drugs regularly. Her weight has been stable. Her constipation is well controlled with several times per week MiraLax. She's not had colonoscopy. There's been no melena or hematochezia. She has been treated previously for Helicobacter pylori.   GI Review of Systems    Reports abdominal pain and  heartburn.     Location of  Abdominal pain: epigastric area.    Denies acid reflux, belching, bloating, chest pain, dysphagia with liquids, dysphagia with solids, loss of appetite, nausea, vomiting, vomiting blood, weight loss, and  weight gain.      Reports constipation.     Denies anal fissure, black tarry stools, change in bowel habit, diarrhea, diverticulosis, fecal incontinence, heme positive stool, hemorrhoids, irritable bowel syndrome, jaundice, light color stool, liver problems, rectal bleeding, and  rectal pain.  EGD  Procedure date:  11/14/1993  Findings:      large gastric ulcer in mid body. Otherwise normal. Ulcer followed to healing with subsequent endoscopy January 16, 1994.  Helicobacter Pylori testing negative.     Prior Medications Reviewed Using: List Brought by Patient  Updated Prior Medication List: MULTIVITAMINS   TABS (MULTIPLE VITAMIN) once daily CALCIUM 500 500 MG  TABS (CALCIUM CARBONATE) Take 1 tablet by mouth once a day MIRALAX   POWD (POLYETHYLENE GLYCOL 3350) 1 capful once daily TRAMADOL HCL 50 MG  TABS (TRAMADOL HCL) 1-2 every 8 hours MELOXICAM 15 MG  TABS (MELOXICAM) 1 by mouth once daily AMITRIPTYLINE HCL 50 MG  TABS (AMITRIPTYLINE HCL) at bedtime XANAX 0.25 MG  TABS (ALPRAZOLAM) Take 1-2 tablet by mouth every 8 hours as needed PANTOPRAZOLE SODIUM 40 MG  TBEC (PANTOPRAZOLE SODIUM) 1 by mouth qAM  Current Allergies (reviewed today): No known allergies    Family History:    Family History of Diabetes: father daughter    Family History of Heart Disease: mother father brother  Social History:    Patient has never smoked.     Alcohol Use - no    Daily Caffeine Use 3/4 cups coffee daily    Illicit Drug Use - no    Patient gets regular exercise.    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   Risk Factors:  Tobacco use:  never Drug use:  no Alcohol use:  no Exercise:  yes   Review of Systems       arthritis, back pain. All other systems negative.   Vital Signs:  Patient Profile:   70 Years Old Female Height:     62 inches Weight:      136 pounds BMI:     24.96 Pulse rate:   72 / minute Pulse rhythm:   regular BP sitting:  146 / 66  (right arm)  Vitals Entered By: Chales Abrahams CMA (January 25, 2008 1:54 PM)                  Physical Exam  General:     Well developed, well nourished, no acute distress. Head:     Normocephalic and atraumatic. Eyes:     PERRLA, no icterus. Nose:     No deformity, discharge,  or lesions. Mouth:     No deformity or lesions, dentition normal. Neck:     Supple; no masses or thyromegaly. Lungs:     Clear throughout to auscultation. Heart:     Regular rate and rhythm; no  murmurs, rubs,  or bruits. Abdomen:     Soft, nontender and nondistended. No masses, hepatosplenomegaly or hernias noted. Normal bowel sounds. Msk:     Symmetrical with no gross deformities. Normal posture. Pulses:     Normal pulses noted. Extremities:     No clubbing, cyanosis, edema or deformities noted. Neurologic:     Alert and  oriented x4;  grossly normal neurologically. Skin:     Intact without significant lesions or rashes. Cervical Nodes:     No significant cervical adenopathy. Psych:     Alert and cooperative. Normal mood and affect.    Impression & Recommendations:  Problem # 1:  ABDOMINAL PAIN-EPIGASTRIC (ICD-789.06) Epigastric pain may be the result of recurrent ulcer disease. Her risk factors chronic nonsteroidal anti-inflammatory drug use. Only transient improvement on proton pump inhibitors.  PLAN: #1. Continue proton pump inhibitors #2. Schedule upper endoscopy #3. May need alternatives to NSAIDs.  Problem # 2:  CONSTIPATION (ICD-564.00) chronic. Managed well with MiraLax. Continue MiraLax as needed.  Problem # 3:  SCREENING COLORECTAL-CANCER (ICD-V76.51) The patient is an appropriate candidate for screening colonoscopy. No contraindication.  Plan: #1. Colonoscopy. The nature of the procedure as well as the risks, benefits, and alternatives were reviewed in detail. She understood and agreed to proceed.  Problem # 4:  PUD (ICD-533.90) History of. Rule out recurrence.endoscopy as discussed above.     Prescriptions: MOVIPREP 100 GM  SOLR (PEG-KCL-NACL-NASULF-NA ASC-C) As per prep instructions.  #1 x 0   Entered by:   Milford Cage CMA   Authorized by:   Hilarie Fredrickson MD   Signed by:   Milford Cage CMA on 01/25/2008   Method used:   Electronically sent to ...       [id number]*       [address]       [place], [place]  [address]       Ph: [phone]       Fax: [phone]   RxID:   [id number]   ] Copy to: Dr. Illene Regulus

## 2010-09-11 NOTE — Letter (Signed)
   [facility] [address] [place], [place]  [address] Phone: [phone]      June 11, 2010   [facility] [address] [place], [place] [address]  RE:  LAB RESULTS  Dear  Ms. Alsobrook,  The following is an interpretation of your most recent lab tests.  Please take note of any instructions provided or changes to medications that have resulted from your lab work.   Bone density study reveals normal spine and mild osteopenia of the hips.  You need to have 1200mg  daily of calcium - diet + supplement and [phone] international units of Vit D along with regular weight bearing exercise such as walking.   Please come see me if you have any questions about these lab results.   Sincerely Yours,    Jacques Navy MD

## 2010-09-11 NOTE — Assessment & Plan Note (Signed)
Summary: FLU VAC  MEN  STC   Nurse Visit   Allergies: No Known Drug Allergies  Orders Added: 1)  Flu Vaccine 80yrs + [90658] 2)  Administration Flu vaccine - MCR [G0008]   Flu Vaccine Consent Questions     Do you have a history of severe allergic reactions to this vaccine? no    Any prior history of allergic reactions to egg and/or gelatin? no    Do you have a sensitivity to the preservative Thimersol? no    Do you have a past history of Guillan-Barre Syndrome? no    Do you currently have an acute febrile illness? no    Have you ever had a severe reaction to latex? no    Vaccine information given and explained to patient? yes    Are you currently pregnant? no    Lot Number:[id number]   Exp Date:02/08/2010   Site Given  Left Deltoid IMu

## 2010-09-11 NOTE — Progress Notes (Signed)
Summary: H1N1 fax  Phone Note From Pharmacy   Summary of Call: Received fax from pharmacy record of pt H1N1, same sent to scan. Initial call taken by: Zackery Barefoot CMA,  August 18, 2008 6:24 PM        Influenza Immunization History:    Influenza # 1:  H1N1 (08/17/2008)

## 2010-09-11 NOTE — Assessment & Plan Note (Signed)
Summary: per flag/pt will do lab & xray before appt/cd   Vital Signs:  Patient profile:   70 year old female Height:      62 inches Weight:      136.25 pounds BMI:     25.01 O2 Sat:      97 % on Room air Temp:     98.2 degrees F oral Pulse rate:   66 / minute BP sitting:   132 / 80  (left arm) Cuff size:   regular  Vitals Entered By: Beola Cord, CMA (March 27, 2009 9:58 AM)  O2 Flow:  Room air CC: f/u pneumonia   Primary Care Provider:  Illene Regulus MD  CC:  f/u pneumonia.  History of Present Illness: Patient presents for follow-u of left lingular pneumonia requiriing 24 hrs in-patient care with outpatient antibiotics now completed.  She has no cough and no fever, no SOB. she still has a little fatigue.    Current Medications (verified): 1)  Multivitamins   Tabs (Multiple Vitamin) .... Once Daily 2)  Calcium 500 500 Mg  Tabs (Calcium Carbonate) .... Take 1 Tablet By Mouth Once A Day 3)  Miralax   Powd (Polyethylene Glycol 3350) .Marland Kitchen.. 1 Capful Once Daily 4)  Tramadol Hcl 50 Mg  Tabs (Tramadol Hcl) .Marland Kitchen.. 1-2 Every 8 Hours 5)  Meloxicam 15 Mg  Tabs (Meloxicam) .Marland Kitchen.. 1 By Mouth Once Daily 6)  Amitriptyline Hcl 50 Mg  Tabs (Amitriptyline Hcl) .... At Bedtime 7)  Xanax 0.25 Mg  Tabs (Alprazolam) .... Take 1-2 Tablet By Mouth Every 8 Hours As Needed 8)  Omeprazole 20 Mg Tbec (Omeprazole) .... 2 By Mouth Qam 9)  Hyomax-Sl 0.125 Mg Subl (Hyoscyamine Sulfate) .Marland Kitchen.. 1 By Mouth Q 2hr Prn  Allergies (verified): No Known Drug Allergies  Past History:  Past Medical History: Last updated: 12/07/2007  PVD (ICD-443.9) LUMBAGO (ICD-724.2) OSTEOARTHRITIS, BACK (ICD-715.98) DYSPEPSIA (ICD-536.8) FIBROMYALGIA (ICD-729.1)  Past Surgical History: Last updated: 05/04/2007 Lumbar laminectomy Lumbar fusion Tubal ligation Tonsillectomy Breast Augmentation Hysterectomy  Social History: Last updated: 01/25/2008 Patient has never smoked.  Alcohol Use - no Daily Caffeine Use  3/4 cups coffee daily Illicit Drug Use - no Patient gets regular exercise. 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  Physical Exam  General:  Well-developed,well-nourished,in no acute distress; alert,appropriate and cooperative throughout examination Head:  normocephalic and atraumatic.   Eyes:  vision grossly intact, pupils equal, and pupils round.   Ears:  R ear normal and L ear normal.   Neck:  No deformities, masses, or tenderness noted. Lungs:  Normal respiratory effort, chest expands symmetrically. Lungs are clear to auscultation, no crackles or wheezes. Heart:  Normal rate and regular rhythm. S1 and S2 normal without gallop, murmur, click, rub or other extra sounds. Neurologic:  MMSE: 1. day/year-ok 2. content - pres, gov, senator by mouth, news 3. 5 fwd, 4 rev - not; world  4. serial 3's; change making -ok, nickles 5. object recognition 6. 4 legged animals - ok 7. parables 8. judgement - ok 9. 3/3   Impression & Recommendations:  Problem # 1:  BRONCHIAL PNEUMONIA (ICD-485) Left linqular pneumonia. - finished anti-biotics. WBC 5.5, chest x-ray with improvement.  Plan: no further eval at this time.   Problem # 2:  Preventive Health Care (ICD-V70.0) MMSE isw normal with no evidence of cognitive decline.   Complete Medication List: 1)  Multivitamins Tabs (Multiple vitamin) .... Once daily 2)  Calcium 500 500 Mg Tabs (Calcium carbonate) .... Take 1 tablet  by mouth once a day 3)  Miralax Powd (Polyethylene glycol 3350) .Marland Kitchen.. 1 capful once daily 4)  Tramadol Hcl 50 Mg Tabs (Tramadol hcl) .Marland Kitchen.. 1-2 every 8 hours 5)  Meloxicam 15 Mg Tabs (Meloxicam) .Marland Kitchen.. 1 by mouth once daily 6)  Amitriptyline Hcl 50 Mg Tabs (Amitriptyline hcl) .... At bedtime 7)  Xanax 0.25 Mg Tabs (Alprazolam) .... Take 1-2 tablet by mouth every 8 hours as needed 8)  Omeprazole 20 Mg Tbec (Omeprazole) .... 2 by mouth qam 9)  Omega-3 350 Mg Caps (Omega-3 fatty acids) .... 3 daily

## 2010-09-11 NOTE — Procedures (Signed)
Summary: [facility] w Bx/[facility] [place] Karene Fry w Bx/[facility]   Imported By: Lester Sligo 02/25/2008 10:02:49  _____________________________________________________________________  External Attachment:    Type:   Image     Comment:   External Document

## 2010-09-11 NOTE — Miscellaneous (Signed)
Summary: ABD Utrasound  Clinical Lists Changes  Orders: Added new Test order of Ultrasound Abdomen (UAS) - Signed

## 2010-09-11 NOTE — Assessment & Plan Note (Signed)
Summary: PER PT STILL HAV'G STOMACH PROBLEM-D/T-$50-STC   Vital Signs:  Patient Profile:   70 Years Old Female Height:     62 inches Weight:      137 pounds BMI:     25.15 Temp:     97.3 degrees F oral Pulse rate:   66 / minute BP sitting:   150 / 80  (left arm) Cuff size:   regular  Pt. in pain?   no  Vitals Entered By: Zackery Barefoot CMA (August 22, 2008 10:56 AM)                  PCP:  Illene Regulus MD  Chief Complaint:  Abdominal Pain .  History of Present Illness: Continues to have abdominal pain. She has seen Dr. Marina Goodell and has had EGD which was negative and and abdominal U/S that was normal. She does continue to have occasional discomfort: bloating and gas and irregular bowel habit. Dr. Marina Goodell had called in Levsin SL which she did not fill for fear of drug-drug interactions. she also has what sounds like break-through reflux at night.  As a seperate problem she reports several episodes of hand tremor and also of loss of feeling in her hands and dropping of objects. Denies any gait disorder, resting tremor or other neurologic symptoms.     Prior Medications Reviewed Using: Patient Recall  Updated Prior Medication List: MULTIVITAMINS   TABS (MULTIPLE VITAMIN) once daily CALCIUM 500 500 MG  TABS (CALCIUM CARBONATE) Take 1 tablet by mouth once a day MIRALAX   POWD (POLYETHYLENE GLYCOL 3350) 1 capful once daily TRAMADOL HCL 50 MG  TABS (TRAMADOL HCL) 1-2 every 8 hours MELOXICAM 15 MG  TABS (MELOXICAM) 1 by mouth once daily AMITRIPTYLINE HCL 50 MG  TABS (AMITRIPTYLINE HCL) at bedtime XANAX 0.25 MG  TABS (ALPRAZOLAM) Take 1-2 tablet by mouth every 8 hours as needed OMEPRAZOLE 20 MG TBEC (OMEPRAZOLE) 2 by mouth qam LEVSIN/SL 0.125 MG SUBL (HYOSCYAMINE SULFATE) Use 1or 2 tabs sub lingual every 4-6 hrs. as needed for stomach pain  Current Allergies (reviewed today): No known allergies   Past Medical History:    Reviewed history from 12/07/2007 and no changes  required:        PVD (ICD-443.9)       LUMBAGO (ICD-724.2)       OSTEOARTHRITIS, BACK (ICD-715.98)       DYSPEPSIA (ICD-536.8)       FIBROMYALGIA (ICD-729.1)         Past Surgical History:    Reviewed history from 05/04/2007 and no changes required:       Lumbar laminectomy       Lumbar fusion       Tubal ligation       Tonsillectomy       Breast Augmentation       Hysterectomy   Social History:    Reviewed history from 01/25/2008 and no changes required:       Patient has never smoked.        Alcohol Use - no       Daily Caffeine Use 3/4 cups coffee daily       Illicit Drug Use - no       Patient gets regular exercise.       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   Risk Factors: Tobacco use:  never Drug use:  no Alcohol use:  no Exercise:  yes  Colonoscopy History:    Date of Last Colonoscopy:  02/24/2008  Mammogram History:    Date of Last Mammogram:  08/15/2003  PAP Smear History:    Date of Last PAP Smear:  08/15/2003   Review of Systems       The patient complains of muscle weakness.  The patient denies anorexia, fever, weight loss, decreased hearing, hoarseness, chest pain, syncope, peripheral edema, prolonged cough, abdominal pain, suspicious skin lesions, and depression.     Physical Exam  General:     WNWD white female NAD Neck:     supple.   Lungs:     normal respiratory effort and no intercostal retractions.   Heart:     normal rate and regular rhythm.   Neurologic:     No resting tremor, no cog-wheeling in the UE, normal gait.    Impression & Recommendations:  Problem # 1:  ABDOMINAL PAIN-EPIGASTRIC (ICD-789.06) At this point some of her problem may be IBS. Explained mechanism of disease. Also, reviewed Levsin SL and how it may help her. Discussed break-through reflux.  Plan: called in hyomax subl 0.125 as cost effective form of Levsin SL.         Advised her to use liquid antacide of H2 blocker in the evening as a as needed  medication.   She has expressed a desire to have the same GI doctor as her husband - Dr. Lina Sar.   Problem # 2:  INTENTION TREMOR (ICD-333.1) Negative exam without tremor or weakness or cogwheeling. suspect intention tremor/benign tremor.  Plan: watchful waiting.   Complete Medication List: 1)  Multivitamins Tabs (Multiple vitamin) .... Once daily 2)  Calcium 500 500 Mg Tabs (Calcium carbonate) .... Take 1 tablet by mouth once a day 3)  Miralax Powd (Polyethylene glycol 3350) .Marland Kitchen.. 1 capful once daily 4)  Tramadol Hcl 50 Mg Tabs (Tramadol hcl) .Marland Kitchen.. 1-2 every 8 hours 5)  Meloxicam 15 Mg Tabs (Meloxicam) .Marland Kitchen.. 1 by mouth once daily 6)  Amitriptyline Hcl 50 Mg Tabs (Amitriptyline hcl) .... At bedtime 7)  Xanax 0.25 Mg Tabs (Alprazolam) .... Take 1-2 tablet by mouth every 8 hours as needed 8)  Omeprazole 20 Mg Tbec (Omeprazole) .... 2 by mouth qam 9)  Hyomax-sl 0.125 Mg Subl (Hyoscyamine sulfate) .Marland Kitchen.. 1 by mouth q 2hr prn    Prescriptions: HYOMAX-SL 0.125 MG SUBL (HYOSCYAMINE SULFATE) 1 by mouth q 2hr prn  #30 x 5   Entered and Authorized by:   Jacques Navy MD   Signed by:   Jacques Navy MD on 08/22/2008   Method used:   Electronically to        [facility] #[id number]* (retail)       [address]       [place], [place]  [address]       Ph: [phone]       Fax: [phone]   RxID:   [id number]

## 2010-09-11 NOTE — Assessment & Plan Note (Signed)
Summary: pneumonia shot/men/cd-PER PT RS TO TUE PM-STC   Nurse Visit   Allergies: No Known Drug Allergies  Immunizations Administered:  Pediatric Pneumococcal Vaccine:    Vaccine Type: Prevnar    Site: left deltoid    Mfr: Merck    Dose: 0.5 ml    Route: IM    Given by: Ami Bullins CMA    Exp. Date: 07/05/2010    Lot #: [id number]    VIS given: 07/21/07 version given April 11, 2009.  Orders Added: 1)  Pneumococcal Vaccine Ped < 44yrs [90669] 2)  Admin 1st Vaccine [phone]

## 2010-09-11 NOTE — Letter (Signed)
   [facility] [address] [place], [place]  [address] Phone: [phone]      January 14, 2008   [facility] [address] [place], [place] [address]  RE:  LAB RESULTS  Dear  Ms. Smead,  The following is an interpretation of your most recent lab tests.  Please take note of any instructions provided or changes to medications that have resulted from your lab work.  Bone density with minimal bone loss and better than average 10 year risk of fracture.   Call or e-mail me if you have questions ([email or web address]).   Sincerely Yours,    Jacques Navy MD  Appended Document:  Mailed copy of letter and DEXA to pt per Dr. Debby Bud.

## 2010-09-11 NOTE — Assessment & Plan Note (Signed)
Summary: issue w/stomach/cd   Vital Signs:  Patient profile:   70 year old female Height:      62 inches Weight:      137 pounds BMI:     25.15 O2 Sat:      98 % on Room air Temp:     97.1 degrees F oral Pulse rate:   68 / minute BP sitting:   132 / 72  (left arm) Cuff size:   regular  Vitals Entered ByMorrie Sheldon Johnson(August 31, 2009 9:43 AM)  O2 Flow:  Room air CC: Pt complains of abdominal pain/discomfort. Pain has been constant since last visit, worse at night. Pain 7-8/10, sore to touch. Taken Tums and Zantac for releif. Hx of ulcers, endoscopy in past year./aj Is Patient Diabetic? No   Primary Care Provider:  Illene Regulus MD  CC:  Pt complains of abdominal pain/discomfort. Pain has been constant since last visit, worse at night. Pain 7-8/10, sore to touch. Taken Tums and Zantac for releif. Hx of ulcers, and endoscopy in past year./aj.  History of Present Illness: Patient presents for on-going abdominal pain, worse at night. She has in the past use antaqcids or zantac. Lately the pain is worse  She has pain with movement She can feeel the soreness. She does take NSAIDs for her OA. No change in Bowel habit, no dark tarry stools or other signs of bleeding.   Chart reviewed: last EGD July '09 which revealed a stricture, hiatal hernia and signs of GERD. No Clo test done. No evidence of peptic ulcer disease.  She is taking omeprazole 40mg  and has been supplementing with Zantac as noted.   Current Medications (verified): 1)  Multivitamins   Tabs (Multiple Vitamin) .... Once Daily 2)  Calcium 500 500 Mg  Tabs (Calcium Carbonate) .... Take 1 Tablet By Mouth Once A Day 3)  Miralax   Powd (Polyethylene Glycol 3350) .Marland Kitchen.. 1 Capful Once Daily 4)  Tramadol Hcl 50 Mg  Tabs (Tramadol Hcl) .Marland Kitchen.. 1-2 Every 8 Hours 5)  Meloxicam 15 Mg  Tabs (Meloxicam) .Marland Kitchen.. 1 By Mouth Once Daily 6)  Amitriptyline Hcl 50 Mg  Tabs (Amitriptyline Hcl) .... At Bedtime 7)  Xanax 0.25 Mg  Tabs (Alprazolam) ....  Take 1-2 Tablet By Mouth Every 8 Hours As Needed 8)  Omeprazole 20 Mg Tbec (Omeprazole) .... 2 By Mouth Qam 9)  Omega-3 350 Mg Caps (Omega-3 Fatty Acids) .... 3 Daily  Allergies (verified): No Known Drug Allergies  Past History:  Past Surgical History: Last updated: 05/04/2007 Lumbar laminectomy Lumbar fusion Tubal ligation Tonsillectomy Breast Augmentation Hysterectomy  Family History: Last updated: 01/25/2008 Family History of Diabetes: father daughter Family History of Heart Disease: mother father brother  Social History: Last updated: 01/25/2008 Patient has never smoked.  Alcohol Use - no Daily Caffeine Use 3/4 cups coffee daily Illicit Drug Use - no Patient gets regular exercise. 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  Past Medical History: Current Problems:  INTENTION TREMOR (ICD-333.1) SCREENING COLORECTAL-CANCER (ICD-V76.51) ABDOMINAL PAIN-EPIGASTRIC (ICD-789.06) CONSTIPATION (ICD-564.00) PUD (ICD-533.90) HELICOBACTER PYLORI GASTRITIS, HX OF (ICD-V12.79) CONSTIPATION, CHRONIC (ICD-564.09) ABDOMINAL PAIN (ICD-789.00) PVD (ICD-443.9) LUMBAGO (ICD-724.2) OSTEOARTHRITIS, BACK (ICD-715.98) DYSPEPSIA (ICD-536.8) FIBROMYALGIA (ICD-729.1)  Review of Systems  The patient denies anorexia, fever, weight loss, weight gain, chest pain, syncope, dyspnea on exertion, hemoptysis, melena, hematochezia, incontinence, suspicious skin lesions, enlarged lymph nodes, and angioedema.    Physical Exam  General:  Well nourished woman who looks younger than her stated age. Head:  Normocephalic and atraumatic  without obvious abnormalities. No apparent alopecia or balding. Lungs:  normal respiratory effort.   Heart:  normal rate and regular rhythm.   Abdomen:  Tender to light palpation in the epigastric region.    Impression & Recommendations:  Problem # 1:  ABDOMINAL PAIN-EPIGASTRIC (ICD-789.06) Patinet with progressive symptoms of dyspepsia with a h/o PUD and H. Pylori  treated in '95, thus no value in ordring Ab titre.  Plan - continue PPI therapy           Add sucralfate 1 g AC/HS, may use as suspension at her descretion.           if no improvement in symptoms - GI consult  Orders: TLB-H. Pylori Abs(Helicobacter Pylori) (86677-HELICO) TLB-CBC Platelet - w/Differential (85025-CBCD)  Complete Medication List: 1)  Multivitamins Tabs (Multiple vitamin) .... Once daily 2)  Calcium 500 500 Mg Tabs (Calcium carbonate) .... Take 1 tablet by mouth once a day 3)  Miralax Powd (Polyethylene glycol 3350) .Marland Kitchen.. 1 capful once daily 4)  Tramadol Hcl 50 Mg Tabs (Tramadol hcl) .Marland Kitchen.. 1-2 every 8 hours 5)  Meloxicam 15 Mg Tabs (Meloxicam) .Marland Kitchen.. 1 by mouth once daily 6)  Amitriptyline Hcl 50 Mg Tabs (Amitriptyline hcl) .... At bedtime 7)  Xanax 0.25 Mg Tabs (Alprazolam) .... Take 1-2 tablet by mouth every 8 hours as needed 8)  Omeprazole 20 Mg Tbec (Omeprazole) .... 2 by mouth qam 9)  Omega-3 350 Mg Caps (Omega-3 fatty acids) .... 3 daily 10)  Sucralfate 1 Gm Tabs (Sucralfate) .Marland Kitchen.. 1 by mouth as tablet or suspension before meals and  bedtime. Prescriptions: SUCRALFATE 1 GM TABS (SUCRALFATE) 1 by mouth as tablet or suspension before meals and  bedtime.  #120 x 12   Entered and Authorized by:   Jacques Navy MD   Signed by:   Jacques Navy MD on 08/31/2009   Method used:   Electronically to        [facility] #[id number]* (retail)       [address]       [place], [place]  [address]       Ph: [phone]       Fax: [phone]   RxID:   [id number]    Preventive Care Screening  Last Pneumovax:    Date:  04/11/2009    Results:  given

## 2010-09-11 NOTE — Assessment & Plan Note (Signed)
Summary: FLU SHOT/MEN [name]   Nurse Visit   Allergies: No Known Drug Allergies  Orders Added: 1)  Flu Vaccine 81yrs + MEDICARE PATIENTS [Q2039] 2)  Administration Flu vaccine - MCR [G0008] Flu Vaccine Consent Questions     Do you have a history of severe allergic reactions to this vaccine? no    Any prior history of allergic reactions to egg and/or gelatin? no    Do you have a sensitivity to the preservative Thimersol? no    Do you have a past history of Guillan-Barre Syndrome? no    Do you currently have an acute febrile illness? no    Have you ever had a severe reaction to latex? no    Vaccine information given and explained to patient? yes    Are you currently pregnant? no    Lot Number:[id number]   Exp Date:02/09/2011   Site Given  Left Deltoid IM

## 2010-09-11 NOTE — Procedures (Signed)
Summary: Colonoscopy   Colonoscopy  Procedure date:  02/24/2008  Findings:      Location:  [facility].    Procedures Next Due Date:    Colonoscopy: 02/2018  Patient Name: [name], [name]. MRN:  Procedure Procedures: Colonoscopy CPT: [phone].  Personnel: Endoscopist: Wilhemina Bonito. Marina Goodell, MD.  Referred By: Rosalyn Gess. Norins, MD.  Exam Location: Exam performed in Outpatient Clinic. Outpatient  Patient Consent: Procedure, Alternatives, Risks and Benefits discussed, consent obtained, from patient. Consent was obtained by the RN.  Indications Symptoms: Constipation Abdominal pain / bloating.  Average Risk Screening Routine.  History  Current Medications: Patient is not currently taking Coumadin.  Pre-Exam Physical: Performed Feb 24, 2008. Cardio-pulmonary exam, Rectal exam, HEENT exam , Abdominal exam, Mental status exam WNL.  Comments: Pt. history reviewed/updated, physical exam performed prior to initiation of sedation?yes Exam Exam: Extent of exam reached: Cecum, extent intended: Cecum.  The cecum was identified by appendiceal orifice and IC valve. Patient position: on left side. Time to Cecum: 00:08:45. Time for Withdrawl: 00:12:42. Colon retroflexion performed. Images taken. ASA Classification: I. Tolerance: excellent.  Monitoring: Pulse and BP monitoring, Oximetry used. Supplemental O2 given.  Colon Prep Used Movi prep for colon prep. Prep results: good.  Sedation Meds: Patient assessed and found to be appropriate for moderate (conscious) sedation. Fentanyl 100 mcg. given IV. Versed 12 given IV.  Findings NORMAL EXAM: Cecum to Rectum.   Assessment Normal examination.  Comments: NO POLYPS SEEN Events  Unplanned Interventions: No intervention was required.  Unplanned Events: There were no complications. Plans Disposition: After procedure patient sent remain in endo suite for second procedure.  Scheduling/Referral: Colonoscopy, to Wilhemina Bonito.  Marina Goodell, MD, [place] 10 [place] for routine screening,    cc:  Luisa Hart L. Lurene Shadow, MD  This report was created from the original endoscopy report, which was reviewed and signed by the above listed endoscopist.   Appended Document: Colonoscopy Patient Name: [name], [name]. MRN:  Procedure Procedures: Colonoscopy CPT: [phone].  Personnel: Endoscopist: Wilhemina Bonito. Marina Goodell, MD.  Referred By: Rosalyn Gess. Norins, MD.  Exam Location: Exam performed in Outpatient Clinic. Outpatient  Patient Consent: Procedure, Alternatives, Risks and Benefits discussed, consent obtained, from patient. Consent was obtained by the RN.  Indications Symptoms: Constipation Abdominal pain / bloating.  Average Risk Screening Routine.  History  Current Medications: Patient is not currently taking Coumadin.  Pre-Exam Physical: Performed Feb 24, 2008. Cardio-pulmonary exam, Rectal exam, HEENT exam , Abdominal exam, Mental status exam WNL.  Comments: Pt. history reviewed/updated, physical exam performed prior to initiation of sedation?yes Exam Exam: Extent of exam reached: Cecum, extent intended: Cecum.  The cecum was identified by appendiceal orifice and IC valve. Patient position: on left side. Time to Cecum: 00:08:45. Time for Withdrawl: 00:12:42. Colon retroflexion performed. Images taken. ASA Classification: I. Tolerance: excellent.  Monitoring: Pulse and BP monitoring, Oximetry used. Supplemental O2 given.  Colon Prep Used Movi prep for colon prep. Prep results: good.  Sedation Meds: Patient assessed and found to be appropriate for moderate (conscious) sedation. Fentanyl 100 mcg. given IV. Versed 12 given IV.  Findings NORMAL EXAM: Cecum to Rectum.   Assessment Normal examination.  Comments: NO POLYPS SEEN Events  Unplanned Interventions: No intervention was required.  Unplanned Events: There were no complications. Plans Disposition: After procedure patient sent remain in endo suite for second  procedure.  Scheduling/Referral: Colonoscopy, to Wilhemina Bonito. Marina Goodell, MD, [place] YE[place] for routine screening,    cc:  Rosalyn Gess. Norins, MD  This report was created from the original endoscopy report, which was reviewed and signed by the above listed endoscopist.

## 2010-09-11 NOTE — Progress Notes (Signed)
  Phone Note Refill Request Message from:  Fax from Pharmacy on July 21, 2009 11:58 AM  Refills Requested: Medication #1:  XANAX 0.25 MG  TABS Take 1-2 tablet by mouth every 8 hours as needed   Last Refilled: 12/27/2008 Can pt have refill? Please Advise  Initial call taken by: Ami Bullins CMA,  July 21, 2009 11:58 AM  Follow-up for Phone Call        OK for refill x 5  Follow-up by: Jacques Navy MD,  July 21, 2009 3:04 PM    Prescriptions: XANAX 0.25 MG  TABS (ALPRAZOLAM) Take 1-2 tablet by mouth every 8 hours as needed  #100 x 5   Entered by:   Ami Bullins CMA   Authorized by:   Jacques Navy MD   Signed by:   Bill Salinas CMA on 07/21/2009   Method used:   Telephoned to ...       [facility] [address] #[id number]* (retail)       [address]       [place], [place]  [address]       Ph: [phone]       Fax: [phone]   RxID:   [id number]

## 2010-10-02 ENCOUNTER — Telehealth: Payer: Self-pay | Admitting: Internal Medicine

## 2010-10-03 ENCOUNTER — Encounter: Payer: Self-pay | Admitting: Internal Medicine

## 2010-10-03 ENCOUNTER — Ambulatory Visit (INDEPENDENT_AMBULATORY_CARE_PROVIDER_SITE_OTHER): Payer: Medicare Other | Admitting: Internal Medicine

## 2010-10-03 DIAGNOSIS — M79609 Pain in unspecified limb: Secondary | ICD-10-CM | POA: Insufficient documentation

## 2010-10-09 NOTE — Progress Notes (Signed)
Summary: NEEDS OV   Phone Note Call from Patient   Summary of Call: Pt left vm - wants to know if she should see ortho or Dr Debby Bud for buttock pain. Needs eval first w/Zameer Borman, need to call and set up apt this week w/Yanelli Zapanta for eval.  Pt called on [phone] but said her cell # was [phone] Initial call taken by: Lamar Sprinkles, CMA,  October 02, 2010 10:14 AM  Follow-up for Phone Call        Scheduled for tomorrow am Follow-up by: Lamar Sprinkles, CMA,  October 02, 2010 12:12 PM

## 2010-10-09 NOTE — Assessment & Plan Note (Signed)
Summary: buttock pain/SD   Vital Signs:  Patient profile:   70 year old female Height:      62 inches Weight:      138 pounds BMI:     25.33 O2 Sat:      98 % on Room air Temp:     98.9 degrees F oral Pulse rate:   63 / minute BP sitting:   128 / 82  (left arm) Cuff size:   regular  Vitals Entered By: Bill Salinas CMA (October 03, 2010 9:20 AM)  O2 Flow:  Room air CC: pt here with c/o pain in the right side buttock region radiating down her right leg/ ab   Primary Care Provider:  Illene Regulus MD  CC:  pt here with c/o pain in the right side buttock region radiating down her right leg/ ab.  History of Present Illness: [name] presents due to pain in the right buttock area with some radiation to her leg. The pain is exacerbated by walking but she is not limited in her activities. She has no h/o trauma or injury. She denies paresthesia or motor weakness. She has not tried any medications other than her routime meds.   Current Medications (verified): 1)  Multivitamins   Tabs (Multiple Vitamin) .... Once Daily 2)  Calcium 500 500 Mg  Tabs (Calcium Carbonate) .... Take 1 Tablet By Mouth Once A Day 3)  Miralax   Powd (Polyethylene Glycol 3350) .Marland Kitchen.. 1 Capful Once Daily 4)  Tramadol Hcl 50 Mg  Tabs (Tramadol Hcl) .Marland Kitchen.. 1-2 Every 8 Hours 5)  Meloxicam 15 Mg  Tabs (Meloxicam) .Marland Kitchen.. 1 By Mouth Once Daily 6)  Amitriptyline Hcl 50 Mg  Tabs (Amitriptyline Hcl) .... At Bedtime 7)  Xanax 0.25 Mg  Tabs (Alprazolam) .... Take 1-2 Tablet By Mouth Every 8 Hours As Needed 8)  Omeprazole 20 Mg Tbec (Omeprazole) .... 2 By Mouth Qam 9)  Omega-3 350 Mg Caps (Omega-3 Fatty Acids) .... 3 Daily  Allergies (verified): No Known Drug Allergies  Past History:  Past Medical History: Last updated: 08/31/2009 Current Problems:  INTENTION TREMOR (ICD-333.1) SCREENING COLORECTAL-CANCER (ICD-V76.51) ABDOMINAL PAIN-EPIGASTRIC (ICD-789.06) CONSTIPATION (ICD-564.00) PUD (ICD-533.90) HELICOBACTER PYLORI  GASTRITIS, HX OF (ICD-V12.79) CONSTIPATION, CHRONIC (ICD-564.09) ABDOMINAL PAIN (ICD-789.00) PVD (ICD-443.9) LUMBAGO (ICD-724.2) OSTEOARTHRITIS, BACK (ICD-715.98) DYSPEPSIA (ICD-536.8) FIBROMYALGIA (ICD-729.1)  Past Surgical History: Last updated: 05/04/2007 Lumbar laminectomy Lumbar fusion Tubal ligation Tonsillectomy Breast Augmentation Hysterectomy FH reviewed for relevance  Social History: Reviewed history from 01/23/2010 and no changes required. Married - 2nd marriage '76 Patient has never smoked.  Alcohol Use - no Daily Caffeine Use 3/4 cups coffee daily Illicit Drug Use - no Patient gets regular exercise. 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  Physical Exam  General:  Well-developed,well-nourished,in no acute distress; alert,appropriate and cooperative throughout examination Head:  normocephalic and atraumatic.   Eyes:  C&S clear Lungs:  normal respiratory effort, normal breath sounds, no crackles, and no wheezes.   Heart:  normal rate and regular rhythm.   Abdomen:  soft and non-tender.   Rectal:  no external abnormalities.   Msk:  normal ROM, no joint warmth, no joint instability, and no crepitation.  Tender to palpation in the piriformis and gluteus muscles. Tender to palpation along the sartorius. Pulses:  2+ Neurologic:  alert & oriented X3, cranial  nerves II-XII intact, and gait normal.     Impression & Recommendations:  Problem # 1:  LEG PAIN, RIGHT (ICD-729.5) Patient with piriformis syndrome  Plan- referral to [facility].           Referred to YouTube  Complete Medication List: 1)  Multivitamins Tabs (Multiple vitamin) .... Once daily 2)  Calcium 500 500 Mg Tabs (Calcium carbonate) .... Take 1 tablet by mouth once a day 3)  Miralax Powd  (Polyethylene glycol 3350) .Marland Kitchen.. 1 capful once daily 4)  Tramadol Hcl 50 Mg Tabs (Tramadol hcl) .Marland Kitchen.. 1-2 every 8 hours 5)  Meloxicam 15 Mg Tabs (Meloxicam) .Marland Kitchen.. 1 by mouth once daily 6)  Amitriptyline Hcl 50 Mg Tabs (Amitriptyline hcl) .... At bedtime 7)  Xanax 0.25 Mg Tabs (Alprazolam) .... Take 1-2 tablet by mouth every 8 hours as needed 8)  Omeprazole 20 Mg Tbec (Omeprazole) .... 2 by mouth qam 9)  Omega-3 350 Mg Caps (Omega-3 fatty acids) .... 3 daily

## 2010-10-19 ENCOUNTER — Telehealth: Payer: Self-pay | Admitting: Internal Medicine

## 2010-10-23 NOTE — Progress Notes (Signed)
Summary: REFERRAL   Phone Note Call from Patient Call back at [phone]   Summary of Call: Patient is requesting referral, thought that MD suggested a [facility] therapist? I see in office visit notes that is says refer to intergrative therapy? Pt would like referral.  Initial call taken by: Lamar Sprinkles, CMA,  October 19, 2010 4:03 PM  Follow-up for Phone Call        ok for referral ot Integrative therapy.  [facility] notified Follow-up by: Jacques Navy MD,  October 19, 2010 6:01 PM

## 2010-10-29 ENCOUNTER — Telehealth: Payer: Self-pay | Admitting: Internal Medicine

## 2010-11-08 NOTE — Progress Notes (Signed)
  Phone Note Refill Request Message from:  Fax from Pharmacy on October 29, 2010 1:13 PM  Refills Requested: Medication #1:  TRAMADOL HCL 50 MG  TABS 1-2 every 8 hours   Last Refilled: 09/27/2010 Please Advise refills  Initial call taken by: Ami Bullins CMA,  October 29, 2010 1:13 PM  Follow-up for Phone Call        ok for refill x 5 Follow-up by: Jacques Navy MD,  October 29, 2010 5:59 PM    Prescriptions: TRAMADOL HCL 50 MG  TABS (TRAMADOL HCL) 1-2 every 8 hours  #180 x 5   Entered by:   Ami Bullins CMA   Authorized by:   Jacques Navy MD   Signed by:   Bill Salinas CMA on 10/29/2010   Method used:   Electronically to        [id number] (retail)       [address]       [place], [place]  [address]       Ph: [phone]       Fax: [phone]   RxID:   [id number]

## 2010-11-15 ENCOUNTER — Other Ambulatory Visit: Payer: Self-pay | Admitting: Internal Medicine

## 2010-11-17 LAB — BASIC METABOLIC PANEL
BUN: 10 mg/dL (ref 6–23)
CO2: 26 mEq/L (ref 19–32)
Calcium: 8.9 mg/dL (ref 8.4–10.5)
Chloride: 102 mEq/L (ref 96–112)
Creatinine, Ser: 0.65 mg/dL (ref 0.4–1.2)
GFR calc Af Amer: >60 mL/min (ref >60)
GFR calc non Af Amer: >60 mL/min (ref >60)
Glucose, Bld: 123 mg/dL — ABNORMAL HIGH (ref 70–99)
Potassium: 4.1 mEq/L (ref 3.5–5.1)
Sodium: 136 mEq/L (ref 135–145)

## 2010-11-17 LAB — CBC
HCT: 31.2 % — ABNORMAL LOW (ref 36.0–46.0)
HCT: 34.9 % — ABNORMAL LOW (ref 36.0–46.0)
Hemoglobin: 10.7 g/dL — ABNORMAL LOW (ref 12.0–15.0)
Hemoglobin: 11.9 g/dL — ABNORMAL LOW (ref 12.0–15.0)
MCHC: 34.1 g/dL (ref 30.0–36.0)
MCHC: 34.3 g/dL (ref 30.0–36.0)
MCV: 91.4 fL (ref 78.0–100.0)
MCV: 91.8 fL (ref 78.0–100.0)
Platelets: 220 10*3/uL (ref 150–400)
Platelets: 247 10*3/uL (ref 150–400)
RBC: 3.41 MIL/uL — ABNORMAL LOW (ref 3.87–5.11)
RBC: 3.8 MIL/uL — ABNORMAL LOW (ref 3.87–5.11)
RDW: 13.2 % (ref 11.5–15.5)
RDW: 13.6 % (ref 11.5–15.5)
WBC: 12.3 10*3/uL — ABNORMAL HIGH (ref 4.0–10.5)
WBC: 13.4 10*3/uL — ABNORMAL HIGH (ref 4.0–10.5)

## 2010-11-17 LAB — CULTURE, BLOOD (ROUTINE X 2)
Culture: NO GROWTH
Culture: NO GROWTH

## 2010-11-17 LAB — DIFFERENTIAL
Basophils Absolute: 0.1 10*3/uL (ref 0.0–0.1)
Basophils Relative: 1 % (ref 0–1)
Eosinophils Absolute: 0.1 10*3/uL (ref 0.0–0.7)
Eosinophils Relative: 1 % (ref 0–5)
Lymphocytes Relative: 7 % — ABNORMAL LOW (ref 12–46)
Lymphs Abs: 0.9 10*3/uL (ref 0.7–4.0)
Monocytes Absolute: 0.5 10*3/uL (ref 0.1–1.0)
Monocytes Relative: 4 % (ref 3–12)
Neutro Abs: 10.8 10*3/uL — ABNORMAL HIGH (ref 1.7–7.7)
Neutrophils Relative %: 88 % — ABNORMAL HIGH (ref 43–77)

## 2010-11-17 LAB — URINALYSIS, ROUTINE W REFLEX MICROSCOPIC
Bilirubin Urine: NEGATIVE
Glucose, UA: NEGATIVE mg/dL
Hgb urine dipstick: NEGATIVE
Ketones, ur: NEGATIVE mg/dL
Nitrite: NEGATIVE
Protein, ur: NEGATIVE mg/dL
Specific Gravity, Urine: 1.014 (ref 1.005–1.030)
Urobilinogen, UA: 1 mg/dL (ref 0.0–1.0)
pH: 7 (ref 5.0–8.0)

## 2010-11-17 LAB — CARDIAC PANEL(CRET KIN+CKTOT+MB+TROPI)
CK, MB: 1.1 ng/mL (ref 0.3–4.0)
CK, MB: 1.3 ng/mL (ref 0.3–4.0)
Relative Index: INVALID (ref 0.0–2.5)
Relative Index: INVALID (ref 0.0–2.5)
Total CK: 64 U/L (ref 7–177)
Total CK: 65 U/L (ref 7–177)
Troponin I: 0.03 ng/mL (ref 0.00–0.06)
Troponin I: 0.03 ng/mL (ref 0.00–0.06)

## 2010-11-17 LAB — STREP PNEUMONIAE URINARY ANTIGEN: Strep Pneumo Urinary Antigen: NEGATIVE

## 2010-11-17 LAB — PHENOL LEVEL, URINE
Creatinine, Urine-PHENUR: 672 mg/L
Phenol (Total), Urine: 2.2 mg/L
Phenol, (Creat Corrected): 3.2 mg/g

## 2010-11-17 LAB — COMPREHENSIVE METABOLIC PANEL
ALT: 12 U/L (ref 0–35)
AST: 26 U/L (ref 0–37)
Albumin: 3.3 g/dL — ABNORMAL LOW (ref 3.5–5.2)
Alkaline Phosphatase: 45 U/L (ref 39–117)
BUN: 10 mg/dL (ref 6–23)
CO2: 25 mEq/L (ref 19–32)
Calcium: 8.4 mg/dL (ref 8.4–10.5)
Chloride: 103 mEq/L (ref 96–112)
Creatinine, Ser: 0.67 mg/dL (ref 0.4–1.2)
GFR calc Af Amer: >60 mL/min (ref >60)
GFR calc non Af Amer: >60 mL/min (ref >60)
Glucose, Bld: 130 mg/dL — ABNORMAL HIGH (ref 70–99)
Potassium: 3.4 mEq/L — ABNORMAL LOW (ref 3.5–5.1)
Sodium: 133 mEq/L — ABNORMAL LOW (ref 135–145)
Total Bilirubin: 0.6 mg/dL (ref 0.3–1.2)
Total Protein: 5.9 g/dL — ABNORMAL LOW (ref 6.0–8.3)

## 2010-11-17 LAB — POCT CARDIAC MARKERS
CKMB, poc: <1 ng/mL — ABNORMAL LOW (ref 1.0–8.0)
Myoglobin, poc: 53.5 ng/mL (ref 12–200)
Troponin i, poc: <0.05 ng/mL (ref 0.00–0.09)

## 2010-11-17 LAB — URINE CULTURE
Colony Count: NO GROWTH
Culture: NO GROWTH

## 2010-11-17 LAB — LEGIONELLA ANTIGEN, URINE: Legionella Antigen, Urine: NEGATIVE

## 2010-11-17 LAB — D-DIMER, QUANTITATIVE: D-Dimer, Quant: 0.46 ug/mL-FEU (ref 0.00–0.48)

## 2010-11-30 ENCOUNTER — Ambulatory Visit (INDEPENDENT_AMBULATORY_CARE_PROVIDER_SITE_OTHER)
Admission: RE | Admit: 2010-11-30 | Discharge: 2010-11-30 | Disposition: A | Discharge status: Home / Self Care | Payer: Medicare Other | Source: Ambulatory Visit | Attending: Internal Medicine | Admitting: Internal Medicine

## 2010-11-30 ENCOUNTER — Ambulatory Visit (INDEPENDENT_AMBULATORY_CARE_PROVIDER_SITE_OTHER): Payer: Medicare Other | Admitting: Internal Medicine

## 2010-11-30 VITALS — BP 154/86 | HR 67 | Temp 98.3°F | Wt 134.0 lb

## 2010-11-30 DIAGNOSIS — M21611 Bunion of right foot: Secondary | ICD-10-CM

## 2010-11-30 DIAGNOSIS — M545 Low back pain, unspecified: Secondary | ICD-10-CM

## 2010-11-30 DIAGNOSIS — R413 Other amnesia: Secondary | ICD-10-CM

## 2010-11-30 DIAGNOSIS — M21619 Bunion of unspecified foot: Secondary | ICD-10-CM

## 2010-12-02 ENCOUNTER — Telehealth: Payer: Self-pay | Admitting: Internal Medicine

## 2010-12-02 ENCOUNTER — Encounter: Payer: Self-pay | Admitting: Internal Medicine

## 2010-12-02 DIAGNOSIS — R413 Other amnesia: Secondary | ICD-10-CM | POA: Insufficient documentation

## 2010-12-02 DIAGNOSIS — M21611 Bunion of right foot: Secondary | ICD-10-CM | POA: Insufficient documentation

## 2010-12-02 NOTE — Telephone Encounter (Signed)
Lumbar spine x-ray read out as ok except for a bit of haziness at site of prosthesis. If pain in the low back gets worse will need CT to evaluated stability of fixation.

## 2010-12-02 NOTE — Progress Notes (Signed)
Subjective:    Patient ID: [name], female    DOB: 21-May-1941, 70 y.o.   MRN: [id number]  HPI Mrs. Schwall presents with a c/o aggrevated low back pain and pain in the right foot. She has a vlagus deformity of the right great toe which is pushing against the second toe. 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 She aslo has erythema and discomfort of the lateral foot.   Her daughter, who is with her today, is concerned about memory and cognitive function. Patient had MMSE in '09 that was reviewed: she did have some trouble with arithmetic function at that time.  Past Medical History  Diagnosis Date  . Intention tremor   . Abdominal pain, epigastric   . Constipation   . PUD (peptic ulcer disease)   . Helicobacter pylori gastritis   . Constipation   . PVD (peripheral vascular disease)   . Lumbago   . Osteoarthritis of spine   . Dyspepsia   . Fibromyalgia    Past Surgical History  Procedure Date  . Lumbar laminectomy   . Lumbar fusion   . Tubal ligation   . Tonsillectomy   . Breast enhancement surgery   . Abdominal hysterectomy    Family History  Problem Relation Age of Onset  . Heart disease Mother   . Diabetes Father   . Heart disease Father   . Heart disease Brother    History   Social History  . Marital Status: Married    Spouse Name: N/A    Number of Children: N/A  . Years of Education: N/A   Occupational History  . Not on file.   Social History Main Topics  . Smoking status: Not on file  . Smokeless tobacco: Not on file  . Alcohol Use: Not on file  . Drug Use: Not on file  . Sexually Active: Not on file   Other Topics Concern  . Not on file   Social History Narrative   Married- second marriage '76.  Patient has never smoked.  Alcohol use-no.  Daily caffeine use  3/4 cups per day.  Patient gets regular exercise.  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  Marriage is is better shape than previously.        Review of Systems  Constitutional: Negative for fever, chills, activity change and appetite change.  HENT: Negative for hearing loss, ear pain, congestion and tinnitus.   Eyes: Negative.   Respiratory: Negative.   Cardiovascular: Negative.  Negative for chest pain and palpitations.  Gastrointestinal: Negative.   Genitourinary: Negative.   Musculoskeletal: Positive for arthralgias.  Neurological: Negative for dizziness, tremors, weakness, numbness and headaches.  Hematological: Negative.  Negative for adenopathy.  Psychiatric/Behavioral: Negative.        Objective:   Physical Exam  Vitals reviewed. Constitutional: She is oriented to person, place, and time. She appears well-developed and well-nourished. No distress.  HENT:  Head: Normocephalic and atraumatic.  Eyes: Conjunctivae and EOM are normal. Pupils are equal, round, and reactive to light.  Neck: Neck supple.  Cardiovascular: Normal rate and regular rhythm.   Pulmonary/Chest: Effort normal.  Musculoskeletal:       Back exam: normal stand; normal flex to greater than 100 degrees; normal gait; normal toe/heel walk; normal step up to exam table; normal SLR sitting; normal DTRs at the patellar tendons; normal sensation to light touch,  pin-prick and deep vibratory stimulus; no  CVA tenderness; able to move supine to sitting witout assistance.   Neurological: She is alert and oriented to person, place, and time. She has normal reflexes.  MMSE: 1. Day, Date, Year - OK 2. Content- knew president, knew Republican front-runner, knew current events 3. 5 fwd - ok, 5 rev - 2 errors, 4 rev - OK. World in reverse - ok 4. Serial 7's - OK with coaching, nickles in $1.25 - ok, Change making - error 5. Naming objects - ok, 4-legged creatures - ok 6. 3 word recall - 2/3 at 5 min 7. Parables - OK 8. Clock face - ok        Assessment & Plan:  1. Foot pain - patient  with "bunion" right great toe with some impact on remaining toes, including worsening of hammer toe deformity  Plan - will ref to Dr. Lajoyce Corners  2. Back pain - stable chronic back pain  3. Memory - no change in performance from last evaluation. No indication for medical therapy.

## 2010-12-03 ENCOUNTER — Telehealth: Payer: Self-pay | Admitting: *Deleted

## 2010-12-03 NOTE — Telephone Encounter (Signed)
Informed pt she will call if low back pain gets worse.

## 2010-12-07 NOTE — Telephone Encounter (Signed)
rx

## 2010-12-25 NOTE — Discharge Summary (Signed)
NAMESALSABEEL, [name]                ACCOUNT NO.:  [id number]   MEDICAL RECORD NO.:  [id number]          PATIENT TYPE:  OBV   LOCATION:  4712                         FACILITY:  [facility]   PHYSICIAN:  Rosalyn Gess. Norins, MD  DATE OF BIRTH:  13-Jun-1941   DATE OF ADMISSION:  03/19/2009  DATE OF DISCHARGE:  03/20/2009                               DISCHARGE SUMMARY   ADMISSION DIAGNOSIS:  Left lingular infiltrate/pneumonia.   DISCHARGE DIAGNOSIS:  Left lingular infiltrate/pneumonia.   CONSULTANTS:  None.   PROCEDURES:  1. A 2D chest x-ray, the one on the eighth read out as lingular      pneumonia.  Followup to ensure radiographic clearing recommended.  2. Follow-up chest x-ray, March 20, 2009, read out as persistent area      of density in the lingula consistent with pneumonia with no      significant change.   HISTORY OF PRESENT ILLNESS:  Ms. Weatherholtz is a 70 year old one who  presented to [facility] emergency department after 1 day episode of  pleuritic chest pain on the left side of the chest and left shoulder  area.  The pain was worse with inspiration and coughing.  It is constant  and is persistent throughout the day.  She also had a hypertensive  episode along with chest pain due to anxiety and panic.  Her temperature  at home was 99.5.  Because of her symptoms, the patient presented to the  emergency department.  Her temperature at that time was 102.  She had  some chills.  Chest x-ray was performed, which revealed the patient to  have a left lingular infiltrate.  CBC was performed, which revealed a  white count 12,300 with 88% segs, 7% lymphs, 4% monos, and 1%  eosinophils.  The patient subsequently admitted with left lingular  pneumonia.  She was initially started on Rocephin 1 g IV q. 24, received  a dose in the emergency department, actually received a dose in the  early a.m. when she arrived to the floor.  She was also started on  azithromycin 500 mg p.o. daily for coverage of  atypicals.   Additional laboratory at admission included D-dimer that was normal.  Cardiac enzymes were negative x3.   HOSPITAL COURSE:  1. The patient was admitted to a telemetry unit.  She remained in      sinus rhythm.  Laboratories were ordered as noted with cardiac      enzymes cycling through was negative.  The patient had no      respiratory distress.  She became afebrile with temperature of      97.6.  Followup CBC performed on the morning at 1 o'clock in the      morning with white count of 13,400.  Strep pneumo urinary antigen      was negative.  With the patient being afebrile, having no      significant respiratory distress with an O2 sat of 96% on room air      with her pleuritic chest pain under control.  She was thought to be  stable and able to be discharged home to complete antibiotic      regimen using Ceftin 250 mg p.o. b.i.d. for an additional 5 days      and azithromycin 500 mg p.o. daily for an additional 2 days.  She      will need followup in the office in approximately 1 week for a      followup chest x-ray and repeat CBC.  2. Cardiac.  The patient ruled out for any cardiac disease with      negative cardiac enzymes x3.   DISCHARGE EXAMINATION:  VITAL SIGNS:  Temperature was 97.6, blood  pressure 93/44, heart rate 66, respirations 18, O2 sats 96% on room air.  GENERAL APPEARANCE:  A somewhat haggard woman looking at her stated age  in no distress.  CHEST:  Patient is moving air well.  He is able take a deep breath  without pain.  She had no rales, wheezes, or rhonchi with particular  attention to the left chest at the axillary line.  CARDIOVASCULAR:  Unremarkable with a regular rate and rhythm.  No  further examination conducted.   DISCHARGE MEDICATIONS:  The patient will resume all of her home  medications.  We will take Ceftin 250 b.i.d. for five additional days,  azithromycin 500 mg once daily for additional 2 days with followup as  noted.   The  patient's condition at time of discharge dictation is stable.      Rosalyn Gess Norins, MD  Electronically Signed     MEN/MEDQ  D:  03/20/2009  T:  03/21/2009  Job:  [id number]

## 2010-12-25 NOTE — H&P (Signed)
NAMESIREEN, [name]                ACCOUNT NO.:  [id number]   MEDICAL RECORD NO.:  [id number]          PATIENT TYPE:  INP   LOCATION:  1824                         FACILITY:  [facility]   PHYSICIAN:  Donalynn Furlong, MD      DATE OF BIRTH:  1941/03/13   DATE OF ADMISSION:  03/19/2009  DATE OF DISCHARGE:                              HISTORY & PHYSICAL   PRIMARY CARE Wentworth Edelen:  Dr. Illene Regulus.   CHIEF COMPLAINT:  Fever, chills, pleuritic chest pain.   HISTORY OF PRESENT ILLNESS:  [name] is a 70 year old Caucasian  female who lives with her husband in [place], [place].  She  presented to [facility] ED today after having 1-day episode of pleuritic  chest pain on the left side of the chest and left shoulder area.  Pain  gets worse with breathing, deep inspiration and coughing.  It is  constant, and it has persisted through the day.  She had a hypertensive  episode also along with the chest pain due to the anxiety and panic.  When she checked her temperature, it was 99.5 at home.  In the ER, it  was 102.  She also had some chills.  She denied any sick contacts.  She  has traveled to [place] and [place], [place], in the last 3 months.  She  denies any sick contacts.  She denies any history of pneumonia in the  past, coronary artery disease or pulmonary embolism.   PAST MEDICAL HISTORY:  Is positive for:  1. Chronic back pain from lumbar laminectomy and fusion surgery.  2. Fibromyalgia.  3. Dyspepsia.  4. Bilateral breast augmentation surgery.  5. Anemia.  6. Osteoarthritis of the back.   PAST SURGICAL HISTORY:  As per past medical history.   HOME MEDICATION LIST:  Is not complete at this time, but it includes  calcium, Xanax, omeprazole, multivitamins, tramadol, amitriptyline,  MiraLax.   ALLERGIES:  The patient is allergic to MORPHINE COMPOUNDS causing rash,  abnormal heart beat and hives.   SOCIAL HISTORY:  No history of smoking.  No alcohol use daily.  Daily  caffeine use.  No illicit drug use.  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   Other prior surgical history includes tubal ligation, tonsillectomy and  hysterectomy.   FAMILY HISTORY:  Nothing contributory to the current problem.   REVIEW OF SYSTEMS:  Positive as per HPI.  Otherwise negative review of  systems was done for 14 systems.   PHYSICAL EXAMINATION:  VITAL SIGNS:  Blood pressure 142/62, pulse 78,  respirations 22, temperature 99.5 on presentation and then 102.3  recently with oxygen saturation of 99% on room air.  GENERAL EXAM:  Alert and oriented x3 laying in bed without any acute  distress.  CARDIOVASCULAR:  S1-S2 regular.  No murmur or gallop.  LUNGS:  Clear to auscultation bilaterally.  No wheezing, rhonchi, or  crackles.  ABDOMEN:  Nontender, nondistended.  Bowel sounds present.  EXTREMITIES:  No clubbing, cyanosis or edema.  Pulses felt in all  4  extremities.  HEAD:  Normocephalic nontraumatic.  EYES:  Pupils reactive to light and accommodation.  Extraocular  movements intact.  ORAL CAVITY:  Oral mucosa moist.  No thrush noted.  NECK:  No thyromegaly or JVD.  SKIN:  No rash or bruise.  NEUROLOGICAL EXAM:  Shows intact cranial nerves, muscular strength,  sensation and reflexes.   LABORATORY WORK:  Chest x-ray:  Two-view shows lingular pneumonia on  left lung.  D-dimer negative.  Basic metabolic panel unremarkable except  elevated glucose.  First set of cardiac enzymes with troponin negative.  WBC 12.3, hemoglobin 11.9.   ASSESSMENT AND PLAN:  1. Community-acquired left lingular pneumonia presented with fever,      chills, pleuritic chest pain and leukocytosis.  2. Uncontrolled hypertension secondary to anxiety.  3. History of chronic back pain.  4. History of fibromyalgia.  5. History of dyspepsia.  6. Bilateral breast augmentation surgery.  7. Hysterectomy in the past.   PLAN:  Will admit the patient on the  telemetry bed under [facility] with a diagnosis community-acquired pneumonia, back pain,  fibromyalgia.  The patient will get CBC, CMP, urinalysis, urine culture,  blood cultures x2, cardiac enzymes with troponin at 11:00 p.m. and 6:00  a.m., chest x-ray two-view in the morning, EKG in the morning.  We will  check urinary strep pneumo antigen and urine Legionella antigen.  The  patient will be getting bedrest with bedside commode.  Will provide  oxygen by nasal cannula to keep saturation more than 90.  Will start IV  antibiotics, ceftriaxone and p.o. azithromycin for community-acquired  pneumonia coverage along with Lovenox and Nexium for DVT and GI  prophylaxis, respectively.  Will continue tramadol for pain.  Will  continue Xanax at home dose.  Will provide Tylenol, Phenergan, Ambien,  Ativan and laxative p.r.n. for symptomatic treatment.  The patient is  not short of breath at this time.  We will provide breathing treatment  if the patient develops any hypoxia.  The patient is stable otherwise.  Further workup according to the pending labs.      Donalynn Furlong, MD  Electronically Signed     TVP/MEDQ  D:  03/19/2009  T:  03/19/2009  Job:  [id number]   cc:   Rosalyn Gess. Norins, MD

## 2010-12-25 NOTE — Assessment & Plan Note (Signed)
[facility]                             PULMONARY OFFICE NOTE   NAME:[name] [name] RUAN                    MRN:          [id number]  DATE:05/26/2007                            DOB:          07/17/1941    HISTORY OF PRESENT ILLNESS:  The patient is a very pleasant 70 year old  white female patient of Dr. Debby Bud who has a known history of  degenerative disk disease and fibromyalgia and presented today for an  acute office visit.  The patient complains that she has noticed a small  sore along the left side of her lip area in the corner of the upper and  lower lip.  The patient reports she has had cold sores in the past and  also had thrush along this area as well.  She has been treated with  antifungals in the past with good resolution; however, she used her  topical fungal creams without any improvement.  The patient denies any  sore throat, nasal congestion, fever, rash, recent ailments.   PAST MEDICAL HISTORY:  Reviewed.   CURRENT MEDICATIONS:  Reviewed.   PHYSICAL EXAMINATION:  GENERAL:  The patient is a very pleasant female  in no acute distress.  VITAL SIGNS:  She is afebrile with stable vital signs.  O2 saturation  100% on room air.  HEENT:  Nasal mucosa is pink and moist.  TMs normal.  Posterior pharynx  is clear.  Along the left corner of the upper and lower lip is a noted  crusted lesion.  NECK:  Supple without adenopathy.  No JVD.  LUNGS:  Lung sounds are clear.  CARDIAC:  Regular rate.   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   PLAN:  1. The patient is to begin Valtrex 2 grams b.i.d. x1 day.  2. The patient is to follow back up with Dr. Debby Bud as scheduled.  If      symptoms do not improve or resolve, will need further followup.      Rubye Oaks, NP  Electronically Signed      Lonzo Cloud. Kriste Basque, MD  Electronically Signed   TP/MedQ  DD: 05/26/2007  DT: 05/27/2007  Job #: [id number]

## 2010-12-28 NOTE — Op Note (Signed)
NAME:  [name], [name]                          ACCOUNT NO.:  [id number]   MEDICAL RECORD NO.:  [id number]                   PATIENT TYPE:  INP   LOCATION:  3028                                 FACILITY:  [facility]   PHYSICIAN:  Cristi Loron, M.D.            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   OPERATION PERFORMED:  L4 Gill procedure; L4-5 posterior lumbar interbody  fusion; placement of bilateral interbody prosthesis (tangent 10 mm wedges);  posterior nonsegmental instrumentation, L4-5 with Legacy M8 titanium pedicle  screws and rods; posterolateral arthrodesis L4-5 with local morselized  autograft bone and Vitoss bone scaffolding.   SURGEON:  Cristi Loron, M.D.   ASSISTANT:  Hewitt Shorts, M.D.   ANESTHESIA:  General endotracheal.   ESTIMATED BLOOD LOSS:  .   SPECIMENS:  None.   DRAINS:  None.   COMPLICATIONS:  None.   INDICATIONS FOR PROCEDURE:  The patient is a 70 year old white female who  has suffered from back and leg pain.  She failed medical management and was  worked up with a lumbar MRI.  This demonstrated an L4-5 grade 1 acquired  spondylolisthesis, stenosis, degenerative disk disease at L4-5.  I discussed  various treatment options with her.  The patient weighed the risks, benefits  and alternatives to surgery and decided to proceed with a lumbar  decompressive fusion.   DESCRIPTION OF PROCEDURE:  The patient was brought to the operating room by  the anesthesia team.  General endotracheal anesthesia was induced.  The  patient was turned to the prone position on  the Wilson frame.  The  lumbosacral region was then prepared with Betadine scrub and Betadine  solution.  Sterile drapes were applied.  I then injected the area to be  incised with Marcaine with epinephrine solution.  I used a scalpel to make a  linear midline incision over the L4-5 interspace.  I used electrocautery to  perform a bilateral subperiosteal dissection, stripping the paraspinous  muscles away from the spinous process and lamina of L4 and L5.  In inserted  the VersiTract retractor for exposure and then obtained intraoperative  radiograph to confirm my location.   We then used the scalpel to incise the L4-5 and L3-4 interspinous ligament  and then used the Leksell rongeur to remove the L4 spinous process as well  as the L4 lamina.  This bone was later cleared of soft tissue and used in  the fusion process.  Of note, the patient's facet joints were quite  degenerated and loose at L4-5.  We then used a high speed drill to  perform  bilateral L4 laminotomies.  We completed the  L4 laminectomy and Gill  procedure with the Kerrison punch removing the remainder of the lamina and  the abnormal pars region and performed generous foraminotomies about the  bilateral L4 and L5 nerve roots.   We now turned our attention to the fusion.  We freed up the thecal sac and  the L5 nerve roots from the epidural tissue and gently retracted the neural  structures medially with the D'Errico retractor and I incised the  intervertebral disk at L4-5 and performed an aggressive diskectomy  bilaterally at L4-5 using the Epstein and Scoville curets and the pituitary  forceps.  We then prepared the vertebral end plates at U0-4 bilaterally with  the 10 mm rotating cutter and the chisel and inserted 10 x 24 tangent bone  dowels bilaterally, of course, after retracting the thecal sac and nerve  roots out of harm's way.  We then packed in between lateral to the bone  grafts with a combination of sacral ala  local morselized autograft bone and  Vitoss bone scaffolding completing the posterior lumbar interbody fusion.   We now turned our attention to the instrumentation.  We used electrocautery  to expose the bilateral transverse processes of L4 and L5 and under  fluoroscopic guidance, we decorticated posterior to the bilateral L4 and L5  pedicles, cannulated the pedicles with the pedicle probes, tapped the  pedicles and then probed the area tapped with the ball probe without any  cortical breaches and inserted a 6.5 x 45 pedicle screws bilaterally at L4  and 6.5 x 40 mm pedicle screws bilaterally at L5 and palpated along the  medial surface of the bilateral L4 and L5 pedicles and noted there were no  cortical breaches and the L4 and L5 nerve roots were not injured.  We then  connected unilateral pedicle screws with the metallic rod across the  construct and then locked the rod in place with the locking caps completing  the instrumentation.   We then turned attention to the posterolateral arthrodesis using the high  speed drill to decorticate the remainder of the facet joint at L4-5 and the  bilateral L4 and L5 transverse processes and a sacral ala  local morselized  autograft bone and Vitoss bone scaffolding was place for decorticating the  posterolateral structures completing the posterolateral arthrodesis.  We  then obtained stringent hemostasis with bilateral electrocautery.  We  inspected the thecal sac and the bilateral L4 and L5 nerve roots.  The nerve  roots and noted that they were all well-decompressed.  We removed the  VersiTract retractor.  Then we approximated the patient's thoracolumbar  fascia with interrupted #1 Vicryl suture with interrupted 2-0 Vicryl suture.  Skin with Steri-Strips and Benzoin.  The wound was then coated with  bacitracin ointment.  Sterile dressings applied.  The drapes were removed. The patient was subsequently turned to supine position where she was   extubated by the anesthesia team and transported to the postanesthesia care  unit in stable condition.  Sponge and instrument counts were correct at the  end of this case.                                               Cristi Loron, M.D.    JDJ/MEDQ  D:  12/01/2002  T:  12/02/2002  Job:  [id number]

## 2010-12-28 NOTE — Discharge Summary (Signed)
   NAME:  [name], COST                          ACCOUNT NO.:  [id number]   MEDICAL RECORD NO.:  [id number]                   PATIENT TYPE:  INP   LOCATION:  3028                                 FACILITY:  [facility]   PHYSICIAN:  Payton Doughty, M.D.                   DATE OF BIRTH:  1941/02/08   DATE OF ADMISSION:  12/01/2002  DATE OF DISCHARGE:  12/05/2002                                 DISCHARGE SUMMARY   ADMISSION DIAGNOSIS:  L4-5 spondylolisthesis.   DISCHARGE DIAGNOSIS:  L4-5 spondylolisthesis.   PROCEDURE:  L4-5 laminectomy, discectomy, posterior lumbar interbody fusion  with TLIF and posterolateral arthrodesis at L4-5.   DISCHARGE STATUS:  Alive and well.   COMPLICATIONS:  None.   HISTORY:  This is a 70 year old right-handed white lady with history and  physical dictated on the chart.  She has degenerative spondylolisthesis at 4-  5 and was admitted by Dr. Lovell Sheehan.   PAST MEDICAL HISTORY:  Remarkable for fibromyalgia.   PAST SURGICAL HISTORY:  Breast augmentation, hysterectomy.   PHYSICAL EXAMINATION:  GENERAL:  Unremarkable.  NEUROLOGIC:  Intact.  She had positive straight leg raise and grade I  spondylolisthesis at 4-5.   HOSPITAL COURSE:  She was admitted after I obtained normal laboratory values  and underwent a decompressive laminectomy and fusion as noted above.  Postoperatively, she had some back pain and some leg pain.  She could not  TOLERATE DEMEROL because it gave her the SHAKES and Valium made her sleepy.  She is currently using Ultram and Valium upon which she will be discharged  home.  She is up eating and voiding normally.  She is able to shower and  take care of her ADLs.   FOLLOW UP:  Dr. Lovell Sheehan by phone call tomorrow to arrange for an office  visit.   DISCHARGE MEDICATIONS:  1. Ultram.  2. Valium.                                               Payton Doughty, M.D.    MWR/MEDQ  D:  12/05/2002  T:  12/06/2002  Job:  [id number]

## 2011-01-14 ENCOUNTER — Other Ambulatory Visit: Payer: Self-pay | Admitting: Internal Medicine

## 2011-01-24 ENCOUNTER — Other Ambulatory Visit: Payer: Self-pay | Admitting: Internal Medicine

## 2011-01-31 ENCOUNTER — Other Ambulatory Visit: Payer: Self-pay | Admitting: Internal Medicine

## 2011-02-01 NOTE — Telephone Encounter (Signed)
Ok for refill x 5 

## 2011-02-01 NOTE — Telephone Encounter (Signed)
Please advise regarding RF request.  

## 2011-02-04 NOTE — Telephone Encounter (Signed)
Rx Done . 

## 2011-02-05 ENCOUNTER — Ambulatory Visit (INDEPENDENT_AMBULATORY_CARE_PROVIDER_SITE_OTHER): Payer: Medicare Other | Admitting: Internal Medicine

## 2011-02-05 ENCOUNTER — Encounter: Payer: Self-pay | Admitting: Internal Medicine

## 2011-02-05 VITALS — BP 146/90 | HR 69 | Temp 97.8°F | Wt 134.0 lb

## 2011-02-05 DIAGNOSIS — R5381 Other malaise: Secondary | ICD-10-CM

## 2011-02-05 DIAGNOSIS — M1990 Unspecified osteoarthritis, unspecified site: Secondary | ICD-10-CM

## 2011-02-05 DIAGNOSIS — R413 Other amnesia: Secondary | ICD-10-CM

## 2011-02-05 NOTE — Progress Notes (Signed)
  Subjective:    Patient ID: [name], female    DOB: Nov 14, 1940, 70 y.o.   MRN: [id number]  HPI [name] presents with a c/o increased back pain, exacerbated by work. She has a long h/o back.pain. She has no limitation in activities. She has not followed up on PT.   She reports that she has increased fatigue that is now occuring during the work day as well as at home. She has not had any lab for 1 year.  She does admit to memory problems. She cannot retain information well. She did well on MMSE April '12. But, she is concerned and is interested in further testing.  She does admit to being on the verge of tears. She does have a lot of stressor. Question if depression/anxiety is playing a role in her symptoms, including possible pseudo dementia  She has seen Dr. Lajoyce Corners for consultation for bunion right foot. He has recommended surgery.   PMH, FamHx and SocHx reviewed for any changes and relevance.   Review of Systems Review of Systems  Constitutional:  Negative for fever, chills, activity change and unexpected weight change.  HEENT:  Negative for hearing loss, ear pain, congestion, neck stiffness and postnasal drip. Negative for sore throat or swallowing problems. Negative for dental complaints.   Eyes: Negative for vision loss or change in visual acuity.  Respiratory: Negative for chest tightness and wheezing.   Cardiovascular: Negative for chest pain and palpitation. No decreased exercise tolerance Gastrointestinal: No change in bowel habit. No bloating or gas. No reflux or indigestion Genitourinary: Negative for urgency, frequency, flank pain and difficulty urinating.  Musculoskeletal: Positive for myalgias, back pain, arthralgias and gait problem. Positive for foot pain due to bunion. Neurological: Negative for dizziness, tremors, weakness and headaches.  Hematological: Negative for adenopathy.  Psychiatric/Behavioral: Positive for memory issues, anxiety and  hyper-emotionality.          Objective:   Physical Exam Vitals noted - stable Gen'l - WNWD nicely groomed white woman in no distress HEENT - [place]/AT, C&S clear Pul - normal respirations Cor - RRR Neuro - A&O x 3, CNII-XII grossly intact, nl gait       Assessment & Plan:

## 2011-02-06 ENCOUNTER — Other Ambulatory Visit (INDEPENDENT_AMBULATORY_CARE_PROVIDER_SITE_OTHER): Payer: Medicare Other

## 2011-02-06 DIAGNOSIS — R413 Other amnesia: Secondary | ICD-10-CM

## 2011-02-06 DIAGNOSIS — R5381 Other malaise: Secondary | ICD-10-CM

## 2011-02-06 DIAGNOSIS — R5383 Other fatigue: Secondary | ICD-10-CM

## 2011-02-06 LAB — CBC WITH DIFFERENTIAL/PLATELET
Basophils Absolute: 0 10*3/uL (ref 0.0–0.1)
Basophils Relative: 0.5 % (ref 0.0–3.0)
Eosinophils Absolute: 0.1 10*3/uL (ref 0.0–0.7)
Eosinophils Relative: 1.7 % (ref 0.0–5.0)
HCT: 34.5 % — ABNORMAL LOW (ref 36.0–46.0)
Hemoglobin: 11.8 g/dL — ABNORMAL LOW (ref 12.0–15.0)
Lymphocytes Relative: 36.1 % (ref 12.0–46.0)
Lymphs Abs: 1.7 10*3/uL (ref 0.7–4.0)
MCHC: 34.2 g/dL (ref 30.0–36.0)
MCV: 92.2 fl (ref 78.0–100.0)
Monocytes Absolute: 0.3 10*3/uL (ref 0.1–1.0)
Monocytes Relative: 7.3 % (ref 3.0–12.0)
Neutro Abs: 2.6 10*3/uL (ref 1.4–7.7)
Neutrophils Relative %: 54.4 % (ref 43.0–77.0)
Platelets: 283 10*3/uL (ref 150.0–400.0)
RBC: 3.74 Mil/uL — ABNORMAL LOW (ref 3.87–5.11)
RDW: 13.6 % (ref 11.5–14.6)
WBC: 4.7 10*3/uL (ref 4.5–10.5)

## 2011-02-06 LAB — VITAMIN B12: Vitamin B-12: 731 pg/mL (ref 211–911)

## 2011-02-06 LAB — SEDIMENTATION RATE: Sed Rate: 14 mm/hr (ref 0–22)

## 2011-02-06 LAB — TSH: TSH: 3.45 u[IU]/mL (ref 0.35–5.50)

## 2011-02-06 LAB — T4, FREE: Free T4: 0.98 ng/dL (ref 0.60–1.60)

## 2011-02-06 NOTE — Assessment & Plan Note (Signed)
Chronic back pain that has been progressive. And somewhat limiting in her usual activities.  Plan - encouraged her to re-engage in physical therapy as well as a home program of daily exercise and stretching

## 2011-02-06 NOTE — Assessment & Plan Note (Addendum)
A great concern to her. Did not repeat MMSE done in April. Discussed the potential for emotional issues to create a pseudo-dementia. Identified with her many emotional stressors.  Plan - will refer to [facility], Dr. Dellia Cloud or Donnamarie Poag, for evaluation of depression as well as psychometry.           ordered repeat routine labs

## 2011-02-07 ENCOUNTER — Encounter: Payer: Self-pay | Admitting: Internal Medicine

## 2011-02-12 ENCOUNTER — Telehealth: Payer: Self-pay

## 2011-02-12 NOTE — Telephone Encounter (Signed)
Patient called [facility] requesting to check status of referral for cognitive function/depression. Please advise

## 2011-02-15 NOTE — Telephone Encounter (Signed)
Checked referral list - Debra sent info over to [place] to schedule apt. Pt informed

## 2011-02-18 ENCOUNTER — Other Ambulatory Visit: Payer: Self-pay | Admitting: Internal Medicine

## 2011-03-07 ENCOUNTER — Other Ambulatory Visit: Payer: Self-pay | Admitting: Internal Medicine

## 2011-04-02 ENCOUNTER — Encounter: Payer: Self-pay | Admitting: Internal Medicine

## 2011-04-04 ENCOUNTER — Ambulatory Visit: Payer: Medicare Other | Admitting: Internal Medicine

## 2011-04-30 ENCOUNTER — Other Ambulatory Visit: Payer: Self-pay | Admitting: Internal Medicine

## 2011-05-02 ENCOUNTER — Other Ambulatory Visit: Payer: Self-pay | Admitting: Internal Medicine

## 2011-05-02 NOTE — Telephone Encounter (Signed)
Please advise for Dr Debby Bud

## 2011-05-22 ENCOUNTER — Other Ambulatory Visit: Payer: Self-pay | Admitting: Internal Medicine

## 2011-07-09 ENCOUNTER — Other Ambulatory Visit: Payer: Self-pay | Admitting: Internal Medicine

## 2011-07-22 ENCOUNTER — Other Ambulatory Visit: Payer: Self-pay | Admitting: Internal Medicine

## 2011-08-14 ENCOUNTER — Telehealth: Payer: Self-pay | Admitting: *Deleted

## 2011-08-14 MED ORDER — ALPRAZOLAM 0.25 MG PO TABS: 0.2500 mg | ORAL_TABLET | Freq: Three times a day (TID) | ORAL | Status: DC | PRN

## 2011-08-14 NOTE — Telephone Encounter (Signed)
Ok x 5 

## 2011-08-14 NOTE — Telephone Encounter (Signed)
Refill request for Alprazolam 0.25 mg tablets sig take one to two tablets by mouth every 8 hours prn . Qty 100 please Advise refill

## 2011-08-21 ENCOUNTER — Telehealth: Payer: Self-pay | Admitting: *Deleted

## 2011-08-21 ENCOUNTER — Other Ambulatory Visit: Payer: Self-pay | Admitting: *Deleted

## 2011-08-21 MED ORDER — OMEPRAZOLE 20 MG PO CPDR: 20.0000 mg | DELAYED_RELEASE_CAPSULE | Freq: Every day | ORAL | Status: DC

## 2011-08-21 MED ORDER — OMEPRAZOLE 20 MG PO CPDR: 40.0000 mg | DELAYED_RELEASE_CAPSULE | Freq: Every day | ORAL | Status: DC

## 2011-08-21 NOTE — Telephone Encounter (Signed)
Refill request for Omeprazole 20mg .

## 2011-08-26 ENCOUNTER — Ambulatory Visit (INDEPENDENT_AMBULATORY_CARE_PROVIDER_SITE_OTHER): Payer: Medicare Other | Admitting: Internal Medicine

## 2011-08-26 DIAGNOSIS — M21619 Bunion of unspecified foot: Secondary | ICD-10-CM

## 2011-08-26 DIAGNOSIS — R413 Other amnesia: Secondary | ICD-10-CM

## 2011-08-26 DIAGNOSIS — M21611 Bunion of right foot: Secondary | ICD-10-CM

## 2011-08-26 DIAGNOSIS — M1990 Unspecified osteoarthritis, unspecified site: Secondary | ICD-10-CM

## 2011-08-26 NOTE — Assessment & Plan Note (Signed)
decision re surgery pending.  P{lan - wide toe box shoes for comfort while debating surgery.

## 2011-08-26 NOTE — Progress Notes (Signed)
  Subjective:    Patient ID: [name], female    DOB: 14-Aug-1940, 71 y.o.   MRN: [id number]  HPI Presents for follow-up of back pain and cognition. She did go to [facility] and did wellness, nutrition, biometric feedback, massage - but that was more painfull. Completed 11 weeks of therapy. Learned exercises for piriformis, low back strengthening. Money well spent.   She does feel that she does have cognitive problems; name recall problems, cognitive decline. She has not yet pursued psych eval to r/o depression as potential confounder re: memory loss  I have reviewed the patient's medical history in detail and updated the computerized patient record.    Review of Systems System review is negative for any constitutional, cardiac, pulmonary, GI or neuro symptoms or complaints other than as described in the HPI.     Objective:   Physical Exam Filed Vitals:   08/26/11 0943  BP: 128/74  Pulse: 73  Temp: 98 F (36.7 C)   Gen'l- WNWD well groomed white woman in no distress HEENT C&S clear Pulm - normal respirations. Cor- 2+ radial pulse, regular Neuro- speech clear, CN II-XII gorssly normal, normal gait and station.       Assessment & Plan:

## 2011-08-26 NOTE — Assessment & Plan Note (Signed)
No formal testing done today. She has had PT to be sure pain wasn't driving memory and cognitive changes.  Plan - psych eval to r/o pseudo-dementia related to depression.

## 2011-08-26 NOTE — Assessment & Plan Note (Addendum)
Continued back pain at the incision site from previous surgery. Suspect scar tissue vs OA. No radicular symptoms. Better with back stretch and exercise. No indication for surgical referral or MRI.  Plan - continue exercise/stretch           Use of heat           Continue meloxicam and tramadol as needed.

## 2011-09-02 ENCOUNTER — Ambulatory Visit (INDEPENDENT_AMBULATORY_CARE_PROVIDER_SITE_OTHER): Payer: 59 | Admitting: Internal Medicine

## 2011-09-02 ENCOUNTER — Encounter: Payer: Self-pay | Admitting: Internal Medicine

## 2011-09-02 VITALS — BP 150/90 | HR 80 | Temp 98.0°F | Resp 16 | Wt 139.0 lb

## 2011-09-02 DIAGNOSIS — T22119A Burn of first degree of unspecified forearm, initial encounter: Secondary | ICD-10-CM
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
## 2011-09-04 ENCOUNTER — Ambulatory Visit (INDEPENDENT_AMBULATORY_CARE_PROVIDER_SITE_OTHER): Payer: 59 | Admitting: Licensed Clinical Social Worker

## 2011-09-04 DIAGNOSIS — F4323 Adjustment disorder with mixed anxiety and depressed mood: Secondary | ICD-10-CM

## 2011-09-16 ENCOUNTER — Other Ambulatory Visit: Payer: Self-pay

## 2011-09-16 MED ORDER — MELOXICAM 15 MG PO TABS: 15.0000 mg | ORAL_TABLET | Freq: Every day | ORAL | Status: DC

## 2011-09-17 ENCOUNTER — Other Ambulatory Visit: Payer: Self-pay | Admitting: *Deleted

## 2011-09-17 NOTE — Telephone Encounter (Signed)
A user error has taken place: refill already done

## 2011-09-20 ENCOUNTER — Ambulatory Visit (INDEPENDENT_AMBULATORY_CARE_PROVIDER_SITE_OTHER): Payer: 59 | Admitting: Endocrinology

## 2011-09-20 ENCOUNTER — Emergency Department (HOSPITAL_COMMUNITY): Payer: Medicare Other

## 2011-09-20 ENCOUNTER — Encounter: Payer: Self-pay | Admitting: Endocrinology

## 2011-09-20 ENCOUNTER — Emergency Department (HOSPITAL_COMMUNITY)
Admission: EM | Admit: 2011-09-20 | Discharge: 2011-09-20 | Disposition: A | Discharge status: Home / Self Care | Payer: Medicare Other | Attending: Emergency Medicine | Admitting: Emergency Medicine

## 2011-09-20 ENCOUNTER — Encounter (HOSPITAL_COMMUNITY): Payer: Self-pay | Admitting: *Deleted

## 2011-09-20 ENCOUNTER — Other Ambulatory Visit (INDEPENDENT_AMBULATORY_CARE_PROVIDER_SITE_OTHER): Payer: 59

## 2011-09-20 VITALS — BP 138/92 | HR 88 | Temp 102.4°F | Ht 62.0 in | Wt 138.8 lb

## 2011-09-20 DIAGNOSIS — R110 Nausea: Secondary | ICD-10-CM | POA: Insufficient documentation

## 2011-09-20 DIAGNOSIS — Z79899 Other long term (current) drug therapy: Secondary | ICD-10-CM | POA: Insufficient documentation

## 2011-09-20 DIAGNOSIS — G250 Essential tremor: Secondary | ICD-10-CM | POA: Insufficient documentation

## 2011-09-20 DIAGNOSIS — Z860100 Personal history of colon polyps, unspecified: Secondary | ICD-10-CM | POA: Insufficient documentation

## 2011-09-20 DIAGNOSIS — R509 Fever, unspecified: Secondary | ICD-10-CM | POA: Insufficient documentation

## 2011-09-20 DIAGNOSIS — K5900 Constipation, unspecified: Secondary | ICD-10-CM | POA: Insufficient documentation

## 2011-09-20 DIAGNOSIS — G252 Other specified forms of tremor: Secondary | ICD-10-CM | POA: Insufficient documentation

## 2011-09-20 DIAGNOSIS — M479 Spondylosis, unspecified: Secondary | ICD-10-CM | POA: Insufficient documentation

## 2011-09-20 DIAGNOSIS — R1013 Epigastric pain: Secondary | ICD-10-CM

## 2011-09-20 DIAGNOSIS — R1011 Right upper quadrant pain: Secondary | ICD-10-CM | POA: Insufficient documentation

## 2011-09-20 DIAGNOSIS — IMO0001 Reserved for inherently not codable concepts without codable children: Secondary | ICD-10-CM | POA: Insufficient documentation

## 2011-09-20 LAB — COMPREHENSIVE METABOLIC PANEL
ALT: 14 U/L (ref 0–35)
AST: 24 U/L (ref 0–37)
Albumin: 4.3 g/dL (ref 3.5–5.2)
Alkaline Phosphatase: 67 U/L (ref 39–117)
BUN: 11 mg/dL (ref 6–23)
CO2: 27 mEq/L (ref 19–32)
Calcium: 9.5 mg/dL (ref 8.4–10.5)
Chloride: 99 mEq/L (ref 96–112)
Creatinine, Ser: 0.66 mg/dL (ref 0.50–1.10)
GFR calc Af Amer: >90 mL/min (ref >90)
GFR calc non Af Amer: 87 mL/min — ABNORMAL LOW (ref >90)
Glucose, Bld: 115 mg/dL — ABNORMAL HIGH (ref 70–99)
Potassium: 3.8 mEq/L (ref 3.5–5.1)
Sodium: 136 mEq/L (ref 135–145)
Total Bilirubin: 0.3 mg/dL (ref 0.3–1.2)
Total Protein: 7.5 g/dL (ref 6.0–8.3)

## 2011-09-20 LAB — URINALYSIS, ROUTINE W REFLEX MICROSCOPIC
Bilirubin Urine: NEGATIVE
Glucose, UA: NEGATIVE mg/dL
Hgb urine dipstick: NEGATIVE
Ketones, ur: NEGATIVE mg/dL
Nitrite: NEGATIVE
Protein, ur: NEGATIVE mg/dL
Specific Gravity, Urine: 1.006 (ref 1.005–1.030)
Urobilinogen, UA: 0.2 mg/dL (ref 0.0–1.0)
pH: 7.5 (ref 5.0–8.0)

## 2011-09-20 LAB — CBC WITH DIFFERENTIAL/PLATELET
Basophils Absolute: 0 10*3/uL (ref 0.0–0.1)
Basophils Relative: 0.1 % (ref 0.0–3.0)
Eosinophils Absolute: 0.1 10*3/uL (ref 0.0–0.7)
Eosinophils Relative: 0.4 % (ref 0.0–5.0)
HCT: 36.9 % (ref 36.0–46.0)
Hemoglobin: 12.4 g/dL (ref 12.0–15.0)
Lymphocytes Relative: 7.4 % — ABNORMAL LOW (ref 12.0–46.0)
Lymphs Abs: 1 10*3/uL (ref 0.7–4.0)
MCHC: 33.4 g/dL (ref 30.0–36.0)
MCV: 91.9 fl (ref 78.0–100.0)
Monocytes Absolute: 0.5 10*3/uL (ref 0.1–1.0)
Monocytes Relative: 4 % (ref 3.0–12.0)
Neutro Abs: 11.8 10*3/uL — ABNORMAL HIGH (ref 1.4–7.7)
Neutrophils Relative %: 88.1 % — ABNORMAL HIGH (ref 43.0–77.0)
Platelets: 270 10*3/uL (ref 150.0–400.0)
RBC: 4.02 Mil/uL (ref 3.87–5.11)
RDW: 13.6 % (ref 11.5–14.6)
WBC: 13.4 10*3/uL — ABNORMAL HIGH (ref 4.5–10.5)

## 2011-09-20 LAB — CBC
HCT: 33.7 % — ABNORMAL LOW (ref 36.0–46.0)
Hemoglobin: 11.5 g/dL — ABNORMAL LOW (ref 12.0–15.0)
MCH: 30.5 pg (ref 26.0–34.0)
MCHC: 34.1 g/dL (ref 30.0–36.0)
MCV: 89.4 fL (ref 78.0–100.0)
Platelets: 277 10*3/uL (ref 150–400)
RBC: 3.77 MIL/uL — ABNORMAL LOW (ref 3.87–5.11)
RDW: 12.7 % (ref 11.5–15.5)
WBC: 14.4 10*3/uL — ABNORMAL HIGH (ref 4.0–10.5)

## 2011-09-20 LAB — BASIC METABOLIC PANEL
BUN: 14 mg/dL (ref 6–23)
CO2: 31 mEq/L (ref 19–32)
Calcium: 9.3 mg/dL (ref 8.4–10.5)
Chloride: 99 mEq/L (ref 96–112)
Creatinine, Ser: 0.6 mg/dL (ref 0.4–1.2)
GFR: 97.35 mL/min (ref >60.00)
Glucose, Bld: 108 mg/dL — ABNORMAL HIGH (ref 70–99)
Potassium: 4.1 mEq/L (ref 3.5–5.1)
Sodium: 135 mEq/L (ref 135–145)

## 2011-09-20 LAB — HEPATIC FUNCTION PANEL
ALT: 19 U/L (ref 0–35)
AST: 26 U/L (ref 0–37)
Albumin: 4.5 g/dL (ref 3.5–5.2)
Alkaline Phosphatase: 62 U/L (ref 39–117)
Bilirubin, Direct: 0.1 mg/dL (ref 0.0–0.3)
Total Bilirubin: 0.5 mg/dL (ref 0.3–1.2)
Total Protein: 7.6 g/dL (ref 6.0–8.3)

## 2011-09-20 LAB — LACTIC ACID, PLASMA: Lactic Acid, Venous: 0.7 mmol/L (ref 0.5–2.2)

## 2011-09-20 LAB — DIFFERENTIAL
Basophils Absolute: 0 10*3/uL (ref 0.0–0.1)
Basophils Relative: 0 % (ref 0–1)
Eosinophils Absolute: 0.1 10*3/uL (ref 0.0–0.7)
Eosinophils Relative: 0 % (ref 0–5)
Lymphocytes Relative: 11 % — ABNORMAL LOW (ref 12–46)
Lymphs Abs: 1.6 10*3/uL (ref 0.7–4.0)
Monocytes Absolute: 0.7 10*3/uL (ref 0.1–1.0)
Monocytes Relative: 5 % (ref 3–12)
Neutro Abs: 11.9 10*3/uL — ABNORMAL HIGH (ref 1.7–7.7)
Neutrophils Relative %: 83 % — ABNORMAL HIGH (ref 43–77)

## 2011-09-20 LAB — URINE MICROSCOPIC-ADD ON

## 2011-09-20 LAB — LIPASE, BLOOD: Lipase: 14 U/L (ref 11–59)

## 2011-09-20 LAB — AMYLASE: Amylase: 46 U/L (ref 27–131)

## 2011-09-20 MED ORDER — ONDANSETRON HCL 4 MG/2ML IJ SOLN: 4.0000 mg | INTRAMUSCULAR | Status: DC | PRN

## 2011-09-20 MED ORDER — FAMOTIDINE IN NACL 20-0.9 MG/50ML-% IV SOLN
20.0000 mg | Freq: Once | INTRAVENOUS | Status: AC
  Administered 2011-09-20: 20 mg via INTRAVENOUS
  Filled 2011-09-20: qty 50

## 2011-09-20 MED ORDER — ONDANSETRON HCL 4 MG PO TABS: 4.0000 mg | ORAL_TABLET | Freq: Three times a day (TID) | ORAL | Status: AC | PRN

## 2011-09-20 MED ORDER — SODIUM CHLORIDE 0.9 % IV SOLN
INTRAVENOUS | Status: DC
  Administered 2011-09-20: 18:00:00 via INTRAVENOUS

## 2011-09-20 MED ORDER — FENTANYL CITRATE 0.05 MG/ML IJ SOLN: 12.5000 ug | INTRAMUSCULAR | Status: DC | PRN

## 2011-09-20 MED ORDER — IOHEXOL 300 MG/ML  SOLN
100.0000 mL | Freq: Once | INTRAMUSCULAR | Status: AC | PRN
  Administered 2011-09-20: 100 mL via INTRAVENOUS

## 2011-09-20 NOTE — Progress Notes (Signed)
Subjective:    Patient ID: [name], female    DOB: Aug 01, 1941, 71 y.o.   MRN: [id number]  HPI Pt states few days of slight dyspeptic sxs/pain in the epigastric/RUQ area, and assoc fever.  She also has sore throat and myalgias Past Medical History  Diagnosis Date  . Intention tremor   . Abdominal pain, epigastric   . Constipation   . PUD (peptic ulcer disease)   . Helicobacter pylori gastritis   . Constipation   . PVD (peripheral vascular disease)   . Lumbago   . Osteoarthritis of spine   . Dyspepsia   . Fibromyalgia     Past Surgical History  Procedure Date  . Lumbar laminectomy   . Lumbar fusion   . Tubal ligation   . Tonsillectomy   . Breast enhancement surgery   . Abdominal hysterectomy     History   Social History  . Marital Status: Married    Spouse Name: N/A    Number of Children: N/A  . Years of Education: N/A   Occupational History  . 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 Smoking status: Never Smoker   . Smokeless tobacco: Not on file  . Alcohol Use: No  . Drug Use: No  . Sexually Active:    Other Topics Concern  . Not on file   Social History Narrative   Married- second marriage '76.  Patient has never smoked.  Alcohol use-no.  Daily caffeine use  3/4 cups per day.  Patient gets regular exercise.  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  Marriage is is better shape than previously.    Current Outpatient Prescriptions on File Prior to Visit  Medication Sig Dispense Refill  . ALPRAZolam (XANAX) 0.25 MG tablet Take 1 tablet (0.25 mg total) by mouth 3 (three) times daily as needed for sleep.  100 tablet  5  . amitriptyline (ELAVIL) 50 MG tablet TAKE 1 TABLET BY MOUTH EVERY NIGHT AT BEDTIME  30 tablet  2  . calcium carbonate (TUMS - DOSED IN MG ELEMENTAL CALCIUM) 500 MG chewable tablet Chew 1 tablet by mouth daily.        Marland Kitchen glucosamine-chondroitin  500-400 MG tablet Take 1 tablet by mouth 2 (two) times daily.        . meloxicam (MOBIC) 15 MG tablet Take 1 tablet (15 mg total) by mouth daily.  30 tablet  1  . MULTIPLE VITAMIN PO Take by mouth.        . Omega-3 350 MG CAPS Take by mouth.        Marland Kitchen omeprazole (PRILOSEC) 20 MG capsule Take 2 capsules (40 mg total) by mouth daily.  60 capsule  5  . polyethylene glycol powder (GLYCOLAX/MIRALAX) powder TAKE AS DIRECTED  255 g  2  . traMADol (ULTRAM) 50 MG tablet TAKE 1 TO 2 TABLETS BY MOUTH EVERY 8 HOURS  180 tablet  4    No Known Allergies  Family History  Problem Relation Age of Onset  . Heart disease Mother   . Diabetes Father   . Heart disease Father   . Heart disease Brother   . Diabetes Daughter     BP 138/92  Pulse 88  Temp(Src) 102.4 F (39.1 C) (Oral)  Ht 5\' 2"  (1.575 m)  Wt 138 lb 12 oz (62.937 kg)  BMI 25.38 kg/m2  SpO2 98%  Review of Systems She has nausea but no vomiting.  Denies diarrhea and brbpr    Objective:   Physical Exam VITAL SIGNS:  See vs page GENERAL: no distress head: no deformity eyes: no periorbital swelling, no proptosis external nose and ears are normal mouth: no lesion seen ABDOMEN: abdomen is soft, nontender.  no hepatosplenomegaly.  not distended.  no hernia     WBC=13,400    Assessment & Plan:  abd pain.  i am concerned about her risk for acute cholecystitis. Myalgias.  This symptom is in favor of a viral syndrome, but can't be relied upon completely elev bp, prob due to her acute illness.

## 2011-09-20 NOTE — ED Notes (Signed)
Pt in from Dr norris's office due to elevated WBC, pt states she went there due to fever and RUQ pain, states she has been belching a lot and some nausea

## 2011-09-20 NOTE — Patient Instructions (Addendum)
Stop mobic until you feel better.   blood tests are being requested for you today.  please call [phone] to hear your test results.  You will be prompted to enter the 9-digit "MRN" number that appears at the top left of this page, followed by #.  Then you will hear the message.  i have sent a prescription to your pharmacy, for anti-nausea medication.   I hope you feel better soon.  If you don't feel better by next week, please call dr Debby Bud.   (update:  3:15 PM.  i called cell phone # and spoke to husband.  Go to ER now.  He agrees).

## 2011-09-20 NOTE — ED Notes (Signed)
Pt stated that she wanted milk.  After asking nurse, I informed the patient that the doc wanted her to wait until her labs were read.

## 2011-09-20 NOTE — ED Provider Notes (Signed)
History     CSN: [id number]  Arrival date & time 09/20/11  1610   First MD Initiated Contact with Patient 09/20/11 1633      Chief Complaint  Patient presents with  . Abdominal Pain    HPI Pt was seen at 1630.  Per pt, c/o gradual onset and persistence of constant RUQ "pain" for the past several days.  Has been assoc with nausea and "belching." States she had a fever to "102" today.  Pt was eval by her PMD for same and was sent to the ED for further eval.  Denies diarrhea, no vomiting, no CP/SOB, no cough, no back pain, no rash.     PMD:  Dr. Ranell Patrick Past Medical History  Diagnosis Date  . Intention tremor   . Abdominal pain, epigastric   . Constipation   . PUD (peptic ulcer disease)   . Helicobacter pylori gastritis   . Constipation   . PVD (peripheral vascular disease)   . Lumbago   . Osteoarthritis of spine   . Dyspepsia   . Fibromyalgia     Past Surgical History  Procedure Date  . Lumbar laminectomy   . Lumbar fusion   . Tubal ligation   . Tonsillectomy   . Breast enhancement surgery   . Abdominal hysterectomy     Family History  Problem Relation Age of Onset  . Heart disease Mother   . Diabetes Father   . Heart disease Father   . Heart disease Brother   . Diabetes Daughter     History  Substance Use Topics  . Smoking status: Never Smoker   . Smokeless tobacco: Not on file  . Alcohol Use: No    Review of Systems ROS: Statement: All systems negative except as marked or noted in the HPI; Constitutional: Negative for fever and chills. ; ; Eyes: Negative for eye pain, redness and discharge. ; ; ENMT: Negative for ear pain, hoarseness, nasal congestion, sinus pressure and sore throat. ; ; Cardiovascular: Negative for chest pain, palpitations, diaphoresis, dyspnea and peripheral edema. ; ; Respiratory: Negative for cough, wheezing and stridor. ; ; Gastrointestinal: +abd pain, nausea. Negative for vomiting, diarrhea, blood in stool, hematemesis, jaundice and  rectal bleeding. . ; ; Genitourinary: Negative for dysuria, flank pain and hematuria. ; ; Musculoskeletal: Negative for back pain and neck pain. Negative for swelling and trauma.; ; Skin: Negative for pruritus, rash, abrasions, blisters, bruising and skin lesion.; ; Neuro: Negative for headache, lightheadedness and neck stiffness. Negative for weakness, altered level of consciousness , altered mental status, extremity weakness, paresthesias, involuntary movement, seizure and syncope.     Allergies  Review of patient's allergies indicates no known allergies.  Home Medications   Current Outpatient Rx  Name Route Sig Dispense Refill  . ALPRAZOLAM 0.25 MG PO TABS Oral Take 1 tablet (0.25 mg total) by mouth 3 (three) times daily as needed for sleep. 100 tablet 5  . AMITRIPTYLINE HCL 50 MG PO TABS  TAKE 1 TABLET BY MOUTH EVERY NIGHT AT BEDTIME 30 tablet 2  . CALCIUM CARBONATE ANTACID 500 MG PO CHEW Oral Chew 1 tablet by mouth daily.      Marland Kitchen GLUCOSAMINE-CHONDROITIN 500-400 MG PO TABS Oral Take 1 tablet by mouth 2 (two) times daily.      . MELOXICAM 15 MG PO TABS Oral Take 1 tablet (15 mg total) by mouth daily. 30 tablet 1  . MULTIPLE VITAMIN PO Oral Take by mouth.      . OMEGA-3  350 MG PO CAPS Oral Take by mouth.      Marland Kitchen POLYETHYLENE GLYCOL 3350 PO POWD  TAKE AS DIRECTED 255 g 2  . TRAMADOL HCL 50 MG PO TABS  TAKE 1 TO 2 TABLETS BY MOUTH EVERY 8 HOURS 180 tablet 4  . OMEPRAZOLE 20 MG PO CPDR Oral Take 2 capsules (40 mg total) by mouth daily. 60 capsule 5  . ONDANSETRON HCL 4 MG PO TABS Oral Take 1 tablet (4 mg total) by mouth every 8 (eight) hours as needed for nausea. 20 tablet 0    BP 148/65  Pulse 87  Temp(Src) 99.1 F (37.3 C) (Oral)  Resp 20  SpO2 100%  Physical Exam 1635: Physical examination:  Nursing notes reviewed; Vital signs and O2 SAT reviewed;  Constitutional: Well developed, Well nourished, Well hydrated, In no acute distress; Head:  Normocephalic, atraumatic; Eyes: EOMI, PERRL,  No scleral icterus; ENMT: Mouth and pharynx normal, Mucous membranes moist; Neck: Supple, Full range of motion, No lymphadenopathy; Cardiovascular: Regular rate and rhythm, No murmur, rub, or gallop; Respiratory: Breath sounds clear & equal bilaterally, No rales, rhonchi, wheezes, or rub, Normal respiratory effort/excursion; Chest: Nontender, Movement normal; Abdomen: Soft, +mild RUQ tenderness to palp, no rebound or guarding, Nondistended, Normal bowel sounds; Extremities: Pulses normal, No tenderness, No edema, No calf edema or asymmetry.; Neuro: AA&Ox3, Major CN grossly intact.  No gross focal motor or sensory deficits in extremities.; Skin: Color normal, Warm, Dry, no rash.    ED Course  Procedures   MDM  MDM Reviewed: nursing note, vitals and previous chart Interpretation: labs, x-ray and ultrasound   Results for orders placed during the hospital encounter of 09/20/11  URINALYSIS, ROUTINE W REFLEX MICROSCOPIC      Component Value Range   Color, Urine YELLOW  YELLOW    APPearance CLEAR  CLEAR    Specific Gravity, Urine 1.006  1.005 - 1.030    pH 7.5  5.0 - 8.0    Glucose, UA NEGATIVE  NEGATIVE (mg/dL)   Hgb urine dipstick NEGATIVE  NEGATIVE    Bilirubin Urine NEGATIVE  NEGATIVE    Ketones, ur NEGATIVE  NEGATIVE (mg/dL)   Protein, ur NEGATIVE  NEGATIVE (mg/dL)   Urobilinogen, UA 0.2  0.0 - 1.0 (mg/dL)   Nitrite NEGATIVE  NEGATIVE    Leukocytes, UA TRACE (*) NEGATIVE   LACTIC ACID, PLASMA      Component Value Range   Lactic Acid, Venous 0.7  0.5 - 2.2 (mmol/L)  COMPREHENSIVE METABOLIC PANEL      Component Value Range   Sodium 136  135 - 145 (mEq/L)   Potassium 3.8  3.5 - 5.1 (mEq/L)   Chloride 99  96 - 112 (mEq/L)   CO2 27  19 - 32 (mEq/L)   Glucose, Bld 115 (*) 70 - 99 (mg/dL)   BUN 11  6 - 23 (mg/dL)   Creatinine, Ser 5.28  0.50 - 1.10 (mg/dL)   Calcium 9.5  8.4 - 41.3 (mg/dL)   Total Protein 7.5  6.0 - 8.3 (g/dL)   Albumin 4.3  3.5 - 5.2 (g/dL)   AST 24  0 - 37 (U/L)    ALT 14  0 - 35 (U/L)   Alkaline Phosphatase 67  39 - 117 (U/L)   Total Bilirubin 0.3  0.3 - 1.2 (mg/dL)   GFR calc non Af Amer 87 (*) >90 (mL/min)   GFR calc Af Amer >90  >90 (mL/min)  LIPASE, BLOOD      Component  Value Range   Lipase 14  11 - 59 (U/L)  CBC      Component Value Range   WBC 14.4 (*) 4.0 - 10.5 (K/uL)   RBC 3.77 (*) 3.87 - 5.11 (MIL/uL)   Hemoglobin 11.5 (*) 12.0 - 15.0 (g/dL)   HCT 40.9 (*) 81.1 - 46.0 (%)   MCV 89.4  78.0 - 100.0 (fL)   MCH 30.5  26.0 - 34.0 (pg)   MCHC 34.1  30.0 - 36.0 (g/dL)   RDW 91.4  78.2 - 95.6 (%)   Platelets 277  150 - 400 (K/uL)  DIFFERENTIAL      Component Value Range   Neutrophils Relative 83 (*) 43 - 77 (%)   Neutro Abs 11.9 (*) 1.7 - 7.7 (K/uL)   Lymphocytes Relative 11 (*) 12 - 46 (%)   Lymphs Abs 1.6  0.7 - 4.0 (K/uL)   Monocytes Relative 5  3 - 12 (%)   Monocytes Absolute 0.7  0.1 - 1.0 (K/uL)   Eosinophils Relative 0  0 - 5 (%)   Eosinophils Absolute 0.1  0.0 - 0.7 (K/uL)   Basophils Relative 0  0 - 1 (%)   Basophils Absolute 0.0  0.0 - 0.1 (K/uL)  URINE MICROSCOPIC-ADD ON      Component Value Range   Squamous Epithelial / LPF RARE  RARE    WBC, UA 0-2  <3 (WBC/hpf)   RBC / HPF 0-2  <3 (RBC/hpf)   Bacteria, UA RARE  RARE    Dg Chest 2 View 09/20/2011  *RADIOLOGY REPORT*  Clinical Data: Fever, rule out infiltrate  CHEST - 2 VIEW  Comparison: 05/04/2009  Findings: Cardiomediastinal silhouette is stable.  No acute infiltrate or pulmonary edema.  There is left basilar atelectasis or scarring.  Stable mild degenerative changes thoracic spine.  IMPRESSION: No active disease.  Left basilar atelectasis or scarring.  Original Report Authenticated By: Natasha Mead, M.D.   US Abdomen Complete 09/20/2011  *RADIOLOGY REPORT*  Clinical Data:  Right upper quadrant pain.  Fever.  COMPLETE ABDOMINAL ULTRASOUND  Comparison:  02/26/2008  Findings:  Gallbladder:  The gallbladder has a normal appearance.  Gallbladder wall is 2 mm in thickness.  No  stones.  No sonographic Murphy's sign.  Common bile duct:  Common bile duct is 6 mm.  Normal appearance.  Liver:  No focal lesion identified.  Within normal limits in parenchymal echogenicity.  IVC:  Appears normal.  Pancreas:  No focal abnormality identified.  Mild duct ectasia. Pancreatic tail is not well seen because of overlying bowel gas.  Spleen:  The spleen has a normal appearance and measures 5.5 cm.  Right Kidney:  The right kidney has a normal appearance and measures 11.8 cm.  Left Kidney:  The left kidney has a normal appearance and measures 10.8 cm.  Abdominal aorta:  The abdominal aorta is 2.5 cm, not aneurysmal.  IMPRESSION:  1.  No evidence for acute cholecystitis. 2.  Normal appearance of the liver. 3.  Mild pancreatic duct ectasia, a nonspecific finding which can be associated with history of pancreatitis.  No ultrasound evidence for acute pancreatitis or other acute finding.  Original Report Authenticated By: Patterson Hammersmith, M.D.   Ct Abdomen Pelvis W Contrast 09/20/2011  *RADIOLOGY REPORT*  Clinical Data: Epigastric/right upper quadrant pain, leukocytosis  CT ABDOMEN AND PELVIS WITH CONTRAST  Technique:  Multidetector CT imaging of the abdomen and pelvis was performed following the standard protocol during bolus administration of intravenous contrast.  Contrast: OMNIPAQUE IOHEXOL 300 MG/ML IV SOLN  Comparison: None.  Findings: Soft tissue density in the inferior right breast.  This may simply reflect glandular tissue, but appears asymmetric due to patient positioning.  Minimal dependent atelectasis lung bases.  Liver, spleen, and adrenal glands are within normal limits.  Mild pancreatic atrophy.  Gallbladder is unremarkable.  No intrahepatic or extrahepatic ductal dilatation.  Right kidney is unremarkable, noting an extrarenal pelvis.  Left kidney is notable for two possible enhancing 9 mm lesions in the lateral interpolar region (series 2/image 25) and medial lower pole (series 2/image  32).  Left extrarenal pelvis.  No evidence of bowel obstruction.  Normal appendix.  Moderate stool in the colon.  No evidence of abdominal aortic aneurysm.  No abdominopelvic ascites.  No suspicious abdominopelvic lymphadenopathy.  Status post hysterectomy.  No adnexal masses.  Bladder is within normal limits.  Prior posterior fixation with interbody fusion at L4-5.  Mild degenerative changes of the visualized thoracolumbar spine.  IMPRESSION: Normal appendix.  No evidence of bowel obstruction.  Two possible enhancing 9 mm left renal lesions, as described above. Nonemergent MRI abdomen with/without contrast is suggested for further evaluation.  Soft tissue density in the inferior right breast, possibly reflecting asymmetric glandular tissue.  Correlation with screening mammography is suggested.  Original Report Authenticated By: Charline Bills, M.D.     9:47 PM:  VS remain stable in the ED.  Has tol PO well while in ED without N/V/D.  No focal source of infection identified to account for fever.  Pt states she feels "better now" after IV pepcid and wants to go home.  States she has pain meds and antiemetics, does not need any rx from me.  Dx testing, as well as incidental finding of renal lesions, d/w pt and family.  Questions answered.  Verb understanding, agreeable to d/c home with outpt f/u.        Masha Orbach Allison Quarry, DO 09/22/11 [phone]

## 2011-09-22 LAB — URINE CULTURE: Culture  Setup Time: 201302090142

## 2011-09-23 ENCOUNTER — Ambulatory Visit (INDEPENDENT_AMBULATORY_CARE_PROVIDER_SITE_OTHER): Payer: 59 | Admitting: Licensed Clinical Social Worker

## 2011-09-23 ENCOUNTER — Other Ambulatory Visit: Payer: Self-pay | Admitting: Internal Medicine

## 2011-09-23 DIAGNOSIS — F4323 Adjustment disorder with mixed anxiety and depressed mood: Secondary | ICD-10-CM

## 2011-09-26 ENCOUNTER — Telehealth: Payer: Self-pay

## 2011-09-26 NOTE — Telephone Encounter (Signed)
Ok for sucralfate 1 g as suspension AC/HS #120.

## 2011-09-26 NOTE — Telephone Encounter (Signed)
Pt called stating she is having increase stomach pain. Pt has appt with [facility] 02/19 but is requesting Rx of Sucralfate she was previously Rx'd for abd pain, please advise.

## 2011-09-27 ENCOUNTER — Telehealth: Payer: Self-pay

## 2011-09-27 MED ORDER — SUCRALFATE 1 GM/10ML PO SUSP: ORAL | Status: DC

## 2011-09-27 NOTE — Telephone Encounter (Signed)
Done

## 2011-09-27 NOTE — Telephone Encounter (Signed)
Pharmacy is requesting refill of Tramadol 50 mg 1-2 po q8h  #180 last filled 03/29/2011

## 2011-09-29 NOTE — Telephone Encounter (Signed)
Ok for refill of tramadol x 5

## 2011-09-30 MED ORDER — TRAMADOL HCL 50 MG PO TABS: ORAL_TABLET | ORAL | Status: DC

## 2011-10-01 ENCOUNTER — Other Ambulatory Visit: Payer: Self-pay | Admitting: Internal Medicine

## 2011-10-01 ENCOUNTER — Ambulatory Visit (INDEPENDENT_AMBULATORY_CARE_PROVIDER_SITE_OTHER): Payer: Medicare Other | Admitting: Internal Medicine

## 2011-10-01 ENCOUNTER — Encounter: Payer: Self-pay | Admitting: Internal Medicine

## 2011-10-01 DIAGNOSIS — N289 Disorder of kidney and ureter, unspecified: Secondary | ICD-10-CM

## 2011-10-01 DIAGNOSIS — N63 Unspecified lump in unspecified breast: Secondary | ICD-10-CM

## 2011-10-01 DIAGNOSIS — N281 Cyst of kidney, acquired: Secondary | ICD-10-CM

## 2011-10-01 MED ORDER — TRAMADOL HCL 50 MG PO TABS: ORAL_TABLET | ORAL | Status: DC

## 2011-10-01 MED ORDER — SUCRALFATE 1 G PO TABS: ORAL_TABLET | ORAL | Status: DC

## 2011-10-01 NOTE — Patient Instructions (Signed)
Thanks for coming in to see me. You probably had a viral illness with no evidence of any gallbladder disease or other over infection.  There were suspicious small renal cysts on the left. This does need to be evaluated to rule out renal cell carcinoma - this is done by MRI. If there is a problem that needs treatment I will refer you to interventional radiology for percutaneous treatment done by Dr. Fredia Sorrow and his associates.  There was an abnormal density right breast on CT. Plan - diagnostic mammogram tomorrow, 10/02/2011 at [facility] at 10:00 AM. Location: [address] (where [facility] is).  Stomach - very likely gastritis due to non-steroidal anti-inflammatory drug meloxicam. Plan - continue prilosec once a day; continue carafate before meals and bedtime. You may crush the tablets, dissolve in water and take as a suspension. You will need to stop any and all NSAIDs.  If you do not improve with medical therapy we would then refer to GI for possible endoscopy. For pain continue the tramadol and you may take tylenol 1000 mg three times a day on schedule.

## 2011-10-01 NOTE — Progress Notes (Signed)
  Subjective:    Patient ID: [name], female    DOB: Apr 15, 1941, 71 y.o.   MRN: [id number]  HPI Mrs. Bertagnolli was seen Feb 8th by Dr SAE for abdominal pain. She had an elevated WBC and was referred to [facility]. Her other labs were normal. She had a CT abdomen which was negative for gallbladder disease. However there were two incidental findings: 1) two 9mm lesions left kidney - films reviewed: at the capsule a homogenous density not c/w fluid filled cyst. MRI was recommended to r/o RCC. 2) a soft tissue density in the lower aspect of the right breast - clearly visible on CT. Her last mammogram was in '11 and was normal.  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 She had been prescribed sucralfate with good results. She had been taking NSAIDs (meloxicam) as a possible source of irritation.  I have reviewed the patient's medical history in detail and updated the computerized patient record.    Review of Systems     Objective:   Physical Exam Filed Vitals:   10/01/11 1102  BP: 130/82  Pulse: 83  Temp: 97.1 F (36.2 C)  Resp: 14  Respirations - normal Cor - RRR       Assessment & Plan:  Breast lump - patient has implants. She had a density on CT - lesion vs leak from implant Plan - diagnostic mammogram Weds 2/20 10:00 at [facility].  Renal lesion - per radiology this may be suspicious Plan - MRI w/w/o contrast renal

## 2011-10-08 ENCOUNTER — Ambulatory Visit (HOSPITAL_COMMUNITY)
Admission: RE | Admit: 2011-10-08 | Discharge: 2011-10-08 | Disposition: A | Discharge status: Home / Self Care | Payer: Medicare Other | Source: Ambulatory Visit | Attending: Internal Medicine | Admitting: Internal Medicine

## 2011-10-08 ENCOUNTER — Encounter: Payer: Self-pay | Admitting: Internal Medicine

## 2011-10-08 DIAGNOSIS — N289 Disorder of kidney and ureter, unspecified: Secondary | ICD-10-CM | POA: Insufficient documentation

## 2011-10-08 DIAGNOSIS — N281 Cyst of kidney, acquired: Secondary | ICD-10-CM

## 2011-10-08 DIAGNOSIS — Q278 Other specified congenital malformations of peripheral vascular system: Secondary | ICD-10-CM | POA: Insufficient documentation

## 2011-10-08 MED ORDER — GADOBENATE DIMEGLUMINE 529 MG/ML IV SOLN
12.0000 mL | Freq: Once | INTRAVENOUS | Status: AC | PRN
  Administered 2011-10-08: 12 mL via INTRAVENOUS

## 2011-10-10 ENCOUNTER — Telehealth: Payer: Self-pay | Admitting: Internal Medicine

## 2011-10-10 ENCOUNTER — Other Ambulatory Visit: Payer: Self-pay | Admitting: Internal Medicine

## 2011-10-10 DIAGNOSIS — N289 Disorder of kidney and ureter, unspecified: Secondary | ICD-10-CM

## 2011-10-10 NOTE — Telephone Encounter (Signed)
Patient requesting results of MRI done Tuesday at [facility].

## 2011-10-11 NOTE — Telephone Encounter (Signed)
i called and spoke with patient yesterday morning and told her she would be referred to Dr. Fredia Sorrow, Interventional radiology for possible biopsy

## 2011-10-15 ENCOUNTER — Other Ambulatory Visit: Payer: Self-pay | Admitting: *Deleted

## 2011-10-15 ENCOUNTER — Ambulatory Visit
Admission: RE | Admit: 2011-10-15 | Discharge: 2011-10-15 | Disposition: A | Discharge status: Home / Self Care | Payer: Medicare Other | Source: Ambulatory Visit | Attending: Internal Medicine | Admitting: Internal Medicine

## 2011-10-15 VITALS — BP 119/63 | HR 68 | Temp 98.2°F | Resp 14 | Ht 62.0 in | Wt 134.0 lb

## 2011-10-15 DIAGNOSIS — N289 Disorder of kidney and ureter, unspecified: Secondary | ICD-10-CM

## 2011-10-15 NOTE — Progress Notes (Signed)
[name], [facility] 10/15/2011 9:06 AM Signed  [facility] called regarding referral for pt. They want to know if pt is being referred for possible ablation with biopsy or just for biopsy. They also need the referral to be entered as an order for rad eval for interventional radiology before they can schedule pt.   Contact name at [facility] is Tammy [phone]; must be Order [not referral]; do you want consultation for possible ablation w/Bx or Bx Only; Need RAD Evaluation order for [facility]. Please place correct order in Epic for appointment to be scheduled.

## 2011-10-15 NOTE — Telephone Encounter (Signed)
Order corrected

## 2011-10-15 NOTE — Telephone Encounter (Signed)
[facility] called regarding referral for pt. They want to know if pt is being referred for possible ablation with biopsy or just for biopsy. They also need the referral to be entered as an order for rad eval for interventional radiology before they can schedule pt.

## 2011-10-16 ENCOUNTER — Telehealth: Payer: Self-pay | Admitting: *Deleted

## 2011-10-16 NOTE — Telephone Encounter (Signed)
Incidental finding of fluid around hardware in back. Will refer to Dr. Dutch Quint

## 2011-10-16 NOTE — Telephone Encounter (Signed)
Dr. 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 Pt is req referral to NeuroSurgeon- Dr. Julio Sicks for further eval. Please advise.

## 2011-10-17 ENCOUNTER — Telehealth: Payer: Self-pay | Admitting: Emergency Medicine

## 2011-10-17 ENCOUNTER — Other Ambulatory Visit: Payer: Self-pay | Admitting: Emergency Medicine

## 2011-10-17 DIAGNOSIS — N289 Disorder of kidney and ureter, unspecified: Secondary | ICD-10-CM

## 2011-10-17 NOTE — Telephone Encounter (Signed)
[facility] AT HOME# TO NOTIFY PT OF APPT W/ DR LES BORDEN IS SCHEDULED FOR [phone] AT 1000/945AM.  THEIR OFFICE WILL MAIL HER INFORMATION.

## 2011-10-24 ENCOUNTER — Other Ambulatory Visit: Payer: Self-pay | Admitting: Urology

## 2011-10-29 ENCOUNTER — Encounter: Payer: Self-pay | Admitting: Internal Medicine

## 2011-10-29 ENCOUNTER — Other Ambulatory Visit: Payer: Self-pay

## 2011-10-29 ENCOUNTER — Ambulatory Visit (INDEPENDENT_AMBULATORY_CARE_PROVIDER_SITE_OTHER): Payer: Medicare Other | Admitting: Internal Medicine

## 2011-10-29 VITALS — BP 140/70 | HR 72 | Resp 16 | Wt 136.0 lb

## 2011-10-29 DIAGNOSIS — F411 Generalized anxiety disorder: Secondary | ICD-10-CM

## 2011-10-29 DIAGNOSIS — F418 Other specified anxiety disorders: Secondary | ICD-10-CM

## 2011-10-29 MED ORDER — AMITRIPTYLINE HCL 50 MG PO TABS: ORAL_TABLET | ORAL | Status: DC

## 2011-10-29 NOTE — Progress Notes (Signed)
  Subjective:    Patient ID: [name], female    DOB: 11/04/1940, 71 y.o.   MRN: [id number]  HPI [name] had an incidental finding of several renal cysts which on further evaluation suggested a possible renal cell carcinoma. She has seen Dr. Fredia Sorrow. The lesion of concern is on the lower pole near the ureter and not suited for PCI. She has been referred to Dr. Laverle Patter and is scheduled for robotic surgery April 8th. She has been waking in the early AM, 2-3AM, and unable to return to sleep.  She did have an incidental finding on CT of a right breast lesion. She has had diagnostic mammography and there is no lesion in the breast that correlates with the CT finding. Problem resolved.  PMH, FamHx and SocHx reviewed for any changes and relevance.    Review of Systems System review is negative for any constitutional, cardiac, pulmonary, GI or neuro symptoms or complaints other than as described in the HPI.     Objective:   Physical Exam Filed Vitals:   10/29/11 0944  BP: 140/70  Pulse: 72  Resp: 16   WNWD white woman in no distress Pulm - normal respirations Neuro - A&O x 3 Psych - appropriately concerned and a little anxious.        Assessment & Plan:  Situational anxiety -  Plan - she may increase qHS xanax to 0.5mg  and may take an additional 0.25 mg if she has early awakening.

## 2011-10-29 NOTE — Patient Instructions (Signed)
For robotic surgery - for excision of renal lesion. Dr. Laverle Patter is great!  You are probably having sleep disruption from anxiety. It is fine to take 2 Xanax = 0.5 mg at bedtime and if you wake up it is OK to take an additional tablet 0.25 mg.  I will see you in the hospital April 8th - call to remind me!!!!

## 2011-11-06 ENCOUNTER — Encounter (HOSPITAL_COMMUNITY): Payer: Self-pay | Admitting: Pharmacy Technician

## 2011-11-11 HISTORY — PX: LAPAROSCOPIC PARTIAL NEPHRECTOMY: SUR782

## 2011-11-12 ENCOUNTER — Other Ambulatory Visit: Payer: Self-pay

## 2011-11-12 ENCOUNTER — Encounter (HOSPITAL_COMMUNITY)
Admission: RE | Admit: 2011-11-12 | Discharge: 2011-11-12 | Disposition: A | Discharge status: Home / Self Care | Payer: Medicare Other | Source: Ambulatory Visit | Attending: Urology | Admitting: Urology

## 2011-11-12 ENCOUNTER — Encounter (HOSPITAL_COMMUNITY): Payer: Self-pay

## 2011-11-12 HISTORY — DX: Pneumonia, unspecified organism: J18.9

## 2011-11-12 HISTORY — DX: Gastro-esophageal reflux disease without esophagitis: K21.9

## 2011-11-12 LAB — CBC
HCT: 36 % (ref 36.0–46.0)
Hemoglobin: 11.8 g/dL — ABNORMAL LOW (ref 12.0–15.0)
MCH: 30 pg (ref 26.0–34.0)
MCHC: 32.8 g/dL (ref 30.0–36.0)
MCV: 91.6 fL (ref 78.0–100.0)
Platelets: 289 10*3/uL (ref 150–400)
RBC: 3.93 MIL/uL (ref 3.87–5.11)
RDW: 12.6 % (ref 11.5–15.5)
WBC: 5.9 10*3/uL (ref 4.0–10.5)

## 2011-11-12 LAB — BASIC METABOLIC PANEL
BUN: 14 mg/dL (ref 6–23)
CO2: 30 mEq/L (ref 19–32)
Calcium: 9.6 mg/dL (ref 8.4–10.5)
Chloride: 99 mEq/L (ref 96–112)
Creatinine, Ser: 0.64 mg/dL (ref 0.50–1.10)
GFR calc Af Amer: >90 mL/min (ref >90)
GFR calc non Af Amer: 88 mL/min — ABNORMAL LOW (ref >90)
Glucose, Bld: 82 mg/dL (ref 70–99)
Potassium: 3.9 mEq/L (ref 3.5–5.1)
Sodium: 136 mEq/L (ref 135–145)

## 2011-11-12 LAB — SURGICAL PCR SCREEN
MRSA, PCR: NEGATIVE
Staphylococcus aureus: NEGATIVE

## 2011-11-12 NOTE — Pre-Procedure Instructions (Signed)
Chest 2 view xray 09-20-11 epic

## 2011-11-12 NOTE — Patient Instructions (Signed)
[address] [facility]  11/12/2011   Your procedure is scheduled on:  11/18/11 1100am-230pm  Report to [facility] at 0830 AM.  Call this number if you have problems the morning of surgery: [phone]   Remember:   Do not eat food:After Midnight.  May have clear liquids:until Midnight .  Marland Kitchen  Take these medicines the morning of surgery with A SIP OF WATER:    Do not wear jewelry,   Do not wear lotions, powders, or perfumes.   Do not shave 48 hours prior to surgery.  Do not bring valuables to the hospital.  Contacts, dentures or bridgework may not be worn into surgery.  Leave suitcase in the car. After surgery it may be brought to your room.  For patients admitted to the hospital, checkout time is 11:00 AM the day of discharge.      Special Instructions: CHG Shower Use Special Wash: 1/2 bottle night before surgery and 1/2 bottle morning of surgery. shower chin to toes with CHG.  Wash face and private parts with regular soap.    Please read over the following fact sheets that you were given: MRSA Information, Blood Transfusion Fact sheet, coughing and deep breathing exercises, leg exercises , Incentive Spirometry Fact Sheet

## 2011-11-13 NOTE — Pre-Procedure Instructions (Signed)
11/13/11 Received confirmed EKG .  Shows new pvc and pac's Read by Dr Jacinto Halim.  Called and informed nurse of Dr Laverle Patter Leotis Shames) of ekg findings.  She stated Dr Laverle Patter was out this week but she has been sending him messages. Stated she would let Dr Laverle Patter be aware.

## 2011-11-17 MED ORDER — CEFAZOLIN SODIUM 1-5 GM-% IV SOLN
1.0000 g | INTRAVENOUS | Status: AC
  Administered 2011-11-18: 1 g via INTRAVENOUS

## 2011-11-17 NOTE — H&P (Signed)
Chief Complaint  Left renal neoplasms   Reason For Visit  Reason for consult: To consider nephron sparing surgery for her left renal neoplasms Physician requesting consult: Dr. Irish Lack PCP: Dr. Illene Regulus   History of Present Illness        [name] is a 71 year old female who developed abdominal pain in early February 2013 prompting a CT scan of the abdomen to be performed.  The cause for her abdominal pain was not determined on her imaging but she was noted to have two concerning lesions of her left kidney which were not consistent with simple cysts.  Further imaging with MRI indicated an 11 x 9 mm enhancing lesion toward the medial lower pole of the left kidney just below the level of the renal pelvis which is mostly exophytic and a second lesion measuring 9 x 9 mm which is more equivocal in the lateral interpolar region of the left kidney and mostly endophytic. She has had no hematuria or weight loss.  She has no family history of kidney cancer.  She was seen by Dr. Fredia Sorrow in consultation to consider percutaneous ablation and has now been recommended to be evaluated for possible nephron sparing surgery. She did undergo a chest x-ray a couple of weeks ago. She specifically states that she has been told she had a small questionable nodule seen on prior chest imaging however. She has not undergone axial imaging of the chest.  Her abdominal pain has resolved over the past few weeks.  Her imaging studies were reviewed and there is no evidence of regional lymphadenopathy, contralateral renal masses, no adrenal lesions, and no other evidence of metastatic disease.  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   Baseline renal function: Cr 0.66, eGFR > 60 ml/min   Past Medical History Problems  1. History of  Arthritis V13.4 2. History of  Esophageal Reflux 530.81  Surgical History Problems  1. History of  Breast Surgery Bilateral 2.  History of  Hysterectomy V45.77 3. History of  Lumbar Vertebral Fusion  Current Meds 1. Amitriptyline HCl 50 MG Oral Tablet; Therapy: (Recorded:12Mar2013) to 2. Calcium + D TABS; Therapy: (Recorded:12Mar2013) to 3. MiraLax Oral Powder; Therapy: (Recorded:12Mar2013) to 4. Multi-Vitamin TABS; Therapy: (Recorded:12Mar2013) to 5. Omega-3 1000 MG Oral Capsule; Therapy: (Recorded:12Mar2013) to 6. PriLOSEC 20 MG Oral Capsule Delayed Release; Therapy: (Recorded:12Mar2013) to 7. TraMADol HCl TABS; Therapy: (Recorded:12Mar2013) to 8. Xanax 0.25 MG Oral Tablet; Therapy: (Recorded:12Mar2013) to  Allergies Medication  1. Morphine Derivatives   Morphine causes rash   Family History Problems  1. Family history of  Chronic Obstructive Pulmonary Disease 2. Family history of  Heart Disease V17.49 3. Family history of  Stroke Syndrome V17.1 4. Family history of  Type 2 Diabetes Mellitus Denied  5. Family history of  Kidney Cancer  Social History Problems    Alcohol Use   Marital History - Currently Married   Never A Smoker  Review of Systems Constitutional, skin, eye, otolaryngeal, hematologic/lymphatic, cardiovascular, pulmonary, endocrine, musculoskeletal, gastrointestinal, neurological and psychiatric system(s) were reviewed and pertinent findings if present are noted.  Genitourinary: no hematuria.  Constitutional: no recent weight loss.    Vitals Vital Signs [Data Includes: Last 1 Day]  12Mar2013 08:11AM  BMI Calculated: 24.66 BSA Calculated: 1.61 Height: 5 ft 2 in Weight: 134 lb  Blood Pressure: 152 / 81 Heart Rate: 64  Physical Exam Constitutional: Well nourished and well developed . No acute distress.  ENT:. The ears and nose are normal in appearance.  Neck: The appearance of the neck is normal and no neck mass is present.  Pulmonary: No respiratory distress, normal respiratory rhythm and effort and clear bilateral breath sounds.  Cardiovascular: Heart rate and rhythm are  normal . No peripheral edema.  Abdomen: The abdomen is soft and nontender. No masses are palpated. No CVA tenderness. No hernias are palpable. No hepatosplenomegaly noted.  Lymphatics: The supraclavicular, femoral and inguinal nodes are not enlarged or tender.  Skin: Normal skin turgor, no visible rash and no visible skin lesions.  Neuro/Psych:. Mood and affect are appropriate.    Results/Data Urine [Data Includes: Last 1 Day]   12Mar2013 COLOR YELLOW  APPEARANCE CLEAR  SPECIFIC GRAVITY <1.005  pH 5.5  GLUCOSE NEG mg/dL BILIRUBIN NEG  KETONE NEG mg/dL BLOOD NEG  PROTEIN NEG mg/dL UROBILINOGEN 0.2 mg/dL NITRITE NEG  LEUKOCYTE ESTERASE NEG       I have independently reviewed her recent CT scan and MRI of the abdomen. Findings are as dictated above. I also reviewed her chest x-ray from 09/20/11. There is no evidence to suggest obvious metastatic disease.   I also reviewed her recent laboratory work. Her serum creatinine was 0.66. Her serum electrolytes were within normal limits. Her white blood count was slightly elevated at 14.4. Hemoglobin 11.5, hematocrit 33.7, platelets 277.   Assessment Assessed  1. Renal Neoplasm 239.5  Plan CXR Lungs Solitary Pulmonary Nodule (___ Cm) (793.11)  1. CT-CHEST W/O CONTRAST  Requested for: 12Mar2013 Health Maintenance (V70.0)  2. UA With REFLEX  Done: 12Mar2013 07:59AM Renal Neoplasm (239.5)  3. CBC W/DIFF  Done: 12Mar2013  Discussion/Summary  1. 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 The role of renal biopsy, laboratory testing, and imaging studies to further characterize renal masses and/or the presence of metastatic disease were explained. We discussed the role of active surveillance, surgical therapy  with both radical nephrectomy and nephron-sparing surgery, and ablative therapy in the treatment of renal masses. In addition, we discussed our goals of providing an accurate diagnosis and oncologic control while maintaining optimal renal function as appropriate based on the size, location, and complexity of their renal mass as well as their co-morbidities.    We have discussed the risks of treatment in detail including but not limited to bleeding, infection, heart attack, stroke, death, venothromoboembolism, cancer recurrence, injury/damage to surrounding organs and structures, urine leak, the possibility of open surgical conversion for patients undergoing minimally invasive surgery, the risk of developing chronic kidney disease and its associated implications, and the potential risk of end stage renal disease possibly necessitating dialysis.     She will have a repeat CBC today to ensure that her leukocytosis has resolved. She also adamantly wishes to proceed with further imaging of her chest. While I think the risk of metastatic disease would be quite low, considering her questionable history of a small pulmonary nodule, this would seem to be reasonable and will provide her reassurance as well as serve as a baseline scan going forward. We have reviewed options in detail including surveillance versus ablation versus surgical resection of her renal masses. Considering the location of the mass as well as her excellent health and life expectancy, she understands that surgical resection would represent the gold standard treatment.  I do think this could be performed with minimally invasive surgery and a nephron sparing fashion. I also think are laterally located lesion could be excised with the help of intraoperative ultrasound. She would like to proceed in this fashion and will be scheduled for a left robotic-assisted laparoscopic partial nephrectomy in the near future.  CC: Dr. Illene Regulus Dr. Irish Lack     Amendment I have reviewed all prior chest x-rays. Although she did have pneumonia in 2010 which resolved, I cannot find any other imaging studies that suggest a pulmonary nodule. This has been discussed with the patient. Her CT scan will be canceled.  Amended By: Heloise Purpura; 10/22/2011 10:11 AMEST   Signatures Electronically signed by : Heloise Purpura, M.D.; Oct 22 2011 10:12AM

## 2011-11-18 ENCOUNTER — Encounter (HOSPITAL_COMMUNITY): Payer: Self-pay | Admitting: *Deleted

## 2011-11-18 ENCOUNTER — Telehealth: Payer: Self-pay

## 2011-11-18 ENCOUNTER — Inpatient Hospital Stay (HOSPITAL_COMMUNITY)
Admission: RE | Admit: 2011-11-18 | Discharge: 2011-11-20 | DRG: 658 | Disposition: A | Discharge status: Home / Self Care | Payer: Medicare Other | Source: Ambulatory Visit | Attending: Urology | Admitting: Urology

## 2011-11-18 ENCOUNTER — Ambulatory Visit (HOSPITAL_COMMUNITY): Payer: Medicare Other | Admitting: *Deleted

## 2011-11-18 ENCOUNTER — Encounter (HOSPITAL_COMMUNITY)
Admission: RE | Disposition: A | Discharge status: Home / Self Care | Payer: Self-pay | Source: Ambulatory Visit | Attending: Urology

## 2011-11-18 DIAGNOSIS — R21 Rash and other nonspecific skin eruption: Secondary | ICD-10-CM | POA: Diagnosis not present

## 2011-11-18 DIAGNOSIS — C649 Malignant neoplasm of unspecified kidney, except renal pelvis: Principal | ICD-10-CM | POA: Diagnosis present

## 2011-11-18 DIAGNOSIS — K219 Gastro-esophageal reflux disease without esophagitis: Secondary | ICD-10-CM | POA: Diagnosis present

## 2011-11-18 DIAGNOSIS — D3000 Benign neoplasm of unspecified kidney: Secondary | ICD-10-CM | POA: Diagnosis present

## 2011-11-18 LAB — TYPE AND SCREEN
ABO/RH(D): O POS
Antibody Screen: NEGATIVE

## 2011-11-18 LAB — HEMOGLOBIN AND HEMATOCRIT, BLOOD
HCT: 37 % (ref 36.0–46.0)
Hemoglobin: 12.3 g/dL (ref 12.0–15.0)

## 2011-11-18 LAB — BASIC METABOLIC PANEL
BUN: 9 mg/dL (ref 6–23)
CO2: 27 mEq/L (ref 19–32)
Calcium: 8.9 mg/dL (ref 8.4–10.5)
Chloride: 101 mEq/L (ref 96–112)
Creatinine, Ser: 0.53 mg/dL (ref 0.50–1.10)
GFR calc Af Amer: >90 mL/min (ref >90)
GFR calc non Af Amer: >90 mL/min (ref >90)
Glucose, Bld: 150 mg/dL — ABNORMAL HIGH (ref 70–99)
Potassium: 3.6 mEq/L (ref 3.5–5.1)
Sodium: 139 mEq/L (ref 135–145)

## 2011-11-18 LAB — ABO/RH: ABO/RH(D): O POS

## 2011-11-18 SURGERY — ROBOTIC ASSITED PARTIAL NEPHRECTOMY
Anesthesia: General | Site: Abdomen | Laterality: Left | Wound class: Clean

## 2011-11-18 MED ORDER — DIPHENHYDRAMINE HCL 50 MG/ML IJ SOLN: 12.5000 mg | Freq: Four times a day (QID) | INTRAMUSCULAR | Status: DC | PRN

## 2011-11-18 MED ORDER — ROCURONIUM BROMIDE 100 MG/10ML IV SOLN
INTRAVENOUS | Status: DC | PRN
  Administered 2011-11-18: 40 mg via INTRAVENOUS
  Administered 2011-11-18: 20 mg via INTRAVENOUS
  Administered 2011-11-18: 10 mg via INTRAVENOUS
  Administered 2011-11-18: 20 mg via INTRAVENOUS

## 2011-11-18 MED ORDER — ONDANSETRON HCL 4 MG/2ML IJ SOLN
INTRAMUSCULAR | Status: DC | PRN
  Administered 2011-11-18: 4 mg via INTRAVENOUS

## 2011-11-18 MED ORDER — LIDOCAINE HCL (CARDIAC) 20 MG/ML IV SOLN
INTRAVENOUS | Status: DC | PRN
  Administered 2011-11-18: 100 mg via INTRAVENOUS

## 2011-11-18 MED ORDER — DROPERIDOL 2.5 MG/ML IJ SOLN
INTRAMUSCULAR | Status: DC | PRN
  Administered 2011-11-18: 0.625 mg via INTRAVENOUS

## 2011-11-18 MED ORDER — DOCUSATE SODIUM 100 MG PO CAPS
100.0000 mg | ORAL_CAPSULE | Freq: Two times a day (BID) | ORAL | Status: DC
  Administered 2011-11-18 – 2011-11-20 (×4): 100 mg via ORAL
  Filled 2011-11-18 (×5): qty 1

## 2011-11-18 MED ORDER — GLYCOPYRROLATE 0.2 MG/ML IJ SOLN
INTRAMUSCULAR | Status: DC | PRN
  Administered 2011-11-18: .5 mg via INTRAVENOUS

## 2011-11-18 MED ORDER — INDIGOTINDISULFONATE SODIUM 8 MG/ML IJ SOLN
INTRAMUSCULAR | Status: AC
  Filled 2011-11-18: qty 5

## 2011-11-18 MED ORDER — DIPHENHYDRAMINE HCL 12.5 MG/5ML PO ELIX
12.5000 mg | ORAL_SOLUTION | Freq: Four times a day (QID) | ORAL | Status: DC | PRN
  Administered 2011-11-19: 12.5 mg via ORAL
  Filled 2011-11-18: qty 5

## 2011-11-18 MED ORDER — MANNITOL 25 % IV SOLN
INTRAVENOUS | Status: DC | PRN
  Administered 2011-11-18 (×2): 12.5 g via INTRAVENOUS

## 2011-11-18 MED ORDER — SUCRALFATE 1 G PO TABS
1.0000 g | ORAL_TABLET | Freq: Two times a day (BID) | ORAL | Status: DC
  Administered 2011-11-18 – 2011-11-20 (×4): 1 g via ORAL
  Filled 2011-11-18 (×5): qty 1

## 2011-11-18 MED ORDER — HYDROMORPHONE HCL PF 1 MG/ML IJ SOLN
INTRAMUSCULAR | Status: AC
  Filled 2011-11-18: qty 1

## 2011-11-18 MED ORDER — FENTANYL CITRATE 0.05 MG/ML IJ SOLN
INTRAMUSCULAR | Status: AC
  Filled 2011-11-18: qty 2

## 2011-11-18 MED ORDER — ZOLPIDEM TARTRATE 5 MG PO TABS: 5.0000 mg | ORAL_TABLET | Freq: Every evening | ORAL | Status: DC | PRN

## 2011-11-18 MED ORDER — LACTATED RINGERS IV SOLN
INTRAVENOUS | Status: DC
  Administered 2011-11-18: 1000 mL via INTRAVENOUS

## 2011-11-18 MED ORDER — SUCCINYLCHOLINE CHLORIDE 20 MG/ML IJ SOLN
INTRAMUSCULAR | Status: DC | PRN
  Administered 2011-11-18: 100 mg via INTRAVENOUS

## 2011-11-18 MED ORDER — FENTANYL CITRATE 0.05 MG/ML IJ SOLN
INTRAMUSCULAR | Status: DC | PRN
  Administered 2011-11-18 (×4): 100 ug via INTRAVENOUS
  Administered 2011-11-18 (×2): 50 ug via INTRAVENOUS

## 2011-11-18 MED ORDER — ACETAMINOPHEN 10 MG/ML IV SOLN
1000.0000 mg | Freq: Four times a day (QID) | INTRAVENOUS | Status: DC
  Administered 2011-11-18 – 2011-11-19 (×3): 1000 mg via INTRAVENOUS
  Filled 2011-11-18 (×4): qty 100

## 2011-11-18 MED ORDER — ONDANSETRON HCL 4 MG/2ML IJ SOLN: 4.0000 mg | INTRAMUSCULAR | Status: DC | PRN

## 2011-11-18 MED ORDER — PROPOFOL 10 MG/ML IV EMUL
INTRAVENOUS | Status: DC | PRN
  Administered 2011-11-18: 150 mg via INTRAVENOUS

## 2011-11-18 MED ORDER — HYDROCODONE-ACETAMINOPHEN 5-325 MG PO TABS: 1.0000 | ORAL_TABLET | Freq: Four times a day (QID) | ORAL | Status: DC | PRN

## 2011-11-18 MED ORDER — ALPRAZOLAM 0.25 MG PO TABS
0.2500 mg | ORAL_TABLET | Freq: Three times a day (TID) | ORAL | Status: DC | PRN
  Administered 2011-11-18 – 2011-11-20 (×4): 0.25 mg via ORAL
  Filled 2011-11-18 (×4): qty 1

## 2011-11-18 MED ORDER — NEOSTIGMINE METHYLSULFATE 1 MG/ML IJ SOLN
INTRAMUSCULAR | Status: DC | PRN
  Administered 2011-11-18: 5 mg via INTRAVENOUS

## 2011-11-18 MED ORDER — HYDROMORPHONE HCL PF 1 MG/ML IJ SOLN
0.2500 mg | INTRAMUSCULAR | Status: DC | PRN
  Administered 2011-11-18 (×3): 0.5 mg via INTRAVENOUS

## 2011-11-18 MED ORDER — BUPIVACAINE LIPOSOME 1.3 % IJ SUSP
20.0000 mL | INTRAMUSCULAR | Status: AC
  Administered 2011-11-18: 35 mL
  Filled 2011-11-18: qty 20

## 2011-11-18 MED ORDER — LACTATED RINGERS IR SOLN
Status: DC | PRN
  Administered 2011-11-18: 1

## 2011-11-18 MED ORDER — CEFAZOLIN SODIUM 1-5 GM-% IV SOLN
1.0000 g | Freq: Three times a day (TID) | INTRAVENOUS | Status: AC
  Administered 2011-11-18 – 2011-11-19 (×2): 1 g via INTRAVENOUS
  Filled 2011-11-18 (×2): qty 50

## 2011-11-18 MED ORDER — LACTATED RINGERS IV SOLN
INTRAVENOUS | Status: DC | PRN
  Administered 2011-11-18 (×3): via INTRAVENOUS

## 2011-11-18 MED ORDER — HYDROMORPHONE HCL PF 1 MG/ML IJ SOLN
0.5000 mg | INTRAMUSCULAR | Status: DC | PRN
  Administered 2011-11-18: 0.5 mg via INTRAVENOUS
  Administered 2011-11-18 – 2011-11-19 (×2): 1 mg via INTRAVENOUS
  Administered 2011-11-19: 0.5 mg via INTRAVENOUS
  Filled 2011-11-18 (×6): qty 1

## 2011-11-18 MED ORDER — ACETAMINOPHEN 10 MG/ML IV SOLN
INTRAVENOUS | Status: AC
  Filled 2011-11-18: qty 100

## 2011-11-18 MED ORDER — MIDAZOLAM HCL 5 MG/5ML IJ SOLN
INTRAMUSCULAR | Status: DC | PRN
  Administered 2011-11-18: 2 mg via INTRAVENOUS

## 2011-11-18 MED ORDER — AMITRIPTYLINE HCL 50 MG PO TABS
50.0000 mg | ORAL_TABLET | Freq: Every day | ORAL | Status: DC
  Administered 2011-11-18 – 2011-11-19 (×2): 50 mg via ORAL
  Filled 2011-11-18 (×3): qty 1

## 2011-11-18 MED ORDER — PANTOPRAZOLE SODIUM 40 MG PO TBEC
80.0000 mg | DELAYED_RELEASE_TABLET | Freq: Every day | ORAL | Status: DC
  Administered 2011-11-18 – 2011-11-20 (×3): 80 mg via ORAL
  Filled 2011-11-18: qty 2
  Filled 2011-11-18 (×2): qty 1
  Filled 2011-11-18: qty 2

## 2011-11-18 MED ORDER — MANNITOL 25 % IV SOLN
INTRAVENOUS | Status: AC
  Filled 2011-11-18: qty 50

## 2011-11-18 MED ORDER — DEXTROSE-NACL 5-0.45 % IV SOLN
INTRAVENOUS | Status: DC
  Administered 2011-11-18 – 2011-11-20 (×3): via INTRAVENOUS

## 2011-11-18 MED ORDER — CEFAZOLIN SODIUM 1-5 GM-% IV SOLN
INTRAVENOUS | Status: AC
  Filled 2011-11-18: qty 50

## 2011-11-18 MED ORDER — HYDROMORPHONE HCL PF 1 MG/ML IJ SOLN
INTRAMUSCULAR | Status: AC
  Administered 2011-11-18: 0.5 mg via INTRAVENOUS
  Filled 2011-11-18: qty 1

## 2011-11-18 MED ORDER — FENTANYL CITRATE 0.05 MG/ML IJ SOLN
25.0000 ug | INTRAMUSCULAR | Status: AC | PRN
  Administered 2011-11-18 (×6): 25 ug via INTRAVENOUS

## 2011-11-18 MED ORDER — DEXAMETHASONE SODIUM PHOSPHATE 10 MG/ML IJ SOLN
INTRAMUSCULAR | Status: DC | PRN
  Administered 2011-11-18: 10 mg via INTRAVENOUS

## 2011-11-18 MED ORDER — ACETAMINOPHEN 10 MG/ML IV SOLN
INTRAVENOUS | Status: DC | PRN
  Administered 2011-11-18: 1000 mg via INTRAVENOUS

## 2011-11-18 SURGICAL SUPPLY — 66 items
ADH SKN CLS APL DERMABOND .7 (GAUZE/BANDAGES/DRESSINGS) ×3
AGENT HMST MTR 8 SURGIFLO (HEMOSTASIS) ×1
APL ESCP 34 STRL LF DISP (HEMOSTASIS) ×1
APPLICATOR SURGIFLO ENDO (HEMOSTASIS) ×2 IMPLANT
BAG SPEC RTRVL LRG 6X4 10 (ENDOMECHANICALS) ×1
CANNULA SEAL DVNC (CANNULA) ×3 IMPLANT
CANNULA SEALS DA VINCI (CANNULA) ×3
CHLORAPREP W/TINT 26ML (MISCELLANEOUS) ×2 IMPLANT
CLIP LIGATING HEM O LOK PURPLE (MISCELLANEOUS) ×4 IMPLANT
CLIP LIGATING HEMO O LOK GREEN (MISCELLANEOUS) ×2 IMPLANT
CLOTH BEACON ORANGE TIMEOUT ST (SAFETY) ×2 IMPLANT
CORDS BIPOLAR (ELECTRODE) ×2 IMPLANT
COVER SURGICAL LIGHT HANDLE (MISCELLANEOUS) ×2 IMPLANT
COVER TIP SHEARS 8 DVNC (MISCELLANEOUS) ×1 IMPLANT
COVER TIP SHEARS 8MM DA VINCI (MISCELLANEOUS) ×1
DECANTER SPIKE VIAL GLASS SM (MISCELLANEOUS) ×2 IMPLANT
DERMABOND ADVANCED (GAUZE/BANDAGES/DRESSINGS) ×3
DERMABOND ADVANCED .7 DNX12 (GAUZE/BANDAGES/DRESSINGS) ×3 IMPLANT
DRAIN CHANNEL 15F RND FF 3/16 (WOUND CARE) ×2 IMPLANT
DRAPE INCISE IOBAN 66X45 STRL (DRAPES) ×2 IMPLANT
DRAPE LAPAROSCOPIC ABDOMINAL (DRAPES) ×2 IMPLANT
DRAPE LG THREE QUARTER DISP (DRAPES) ×4 IMPLANT
DRAPE TABLE BACK 44X90 PK DISP (DRAPES) ×2 IMPLANT
DRAPE WARM FLUID 44X44 (DRAPE) ×2 IMPLANT
DRESSING SURGICEL FIBRLLR 1X2 (HEMOSTASIS) ×1 IMPLANT
DRSG SURGICEL FIBRILLAR 1X2 (HEMOSTASIS) ×2
ELECT REM PT RETURN 9FT ADLT (ELECTROSURGICAL) ×4
ELECTRODE REM PT RTRN 9FT ADLT (ELECTROSURGICAL) ×2 IMPLANT
EVACUATOR SILICONE 100CC (DRAIN) ×2 IMPLANT
GAUZE VASELINE 3X9 (GAUZE/BANDAGES/DRESSINGS) IMPLANT
GLOVE BIO SURGEON STRL SZ 6.5 (GLOVE) ×2 IMPLANT
GLOVE BIOGEL M STRL SZ7.5 (GLOVE) ×4 IMPLANT
GOWN STRL NON-REIN LRG LVL3 (GOWN DISPOSABLE) ×6 IMPLANT
GOWN STRL REIN XL XLG (GOWN DISPOSABLE) ×6 IMPLANT
HEMOSTAT SURGICEL 4X8 (HEMOSTASIS) IMPLANT
KIT ACCESSORY DA VINCI DISP (KITS) ×1
KIT ACCESSORY DVNC DISP (KITS) ×1 IMPLANT
KIT BASIN OR (CUSTOM PROCEDURE TRAY) ×2 IMPLANT
NS IRRIG 1000ML POUR BTL (IV SOLUTION) ×2 IMPLANT
PENCIL BUTTON HOLSTER BLD 10FT (ELECTRODE) ×2 IMPLANT
POSITIONER SURGICAL ARM (MISCELLANEOUS) ×4 IMPLANT
POUCH SPECIMEN RETRIEVAL 10MM (ENDOMECHANICALS) ×2 IMPLANT
SET TUBE IRRIG SUCTION NO TIP (IRRIGATION / IRRIGATOR) ×2 IMPLANT
SOLUTION ANTI FOG 6CC (MISCELLANEOUS) ×2 IMPLANT
SOLUTION ELECTROLUBE (MISCELLANEOUS) ×2 IMPLANT
SPOGE SURGIFLO 8M (HEMOSTASIS) ×1
SPONGE LAP 18X18 X RAY DECT (DISPOSABLE) ×2 IMPLANT
SPONGE SURGIFLO 8M (HEMOSTASIS) ×1 IMPLANT
SURGIFLO W/THROMBIN 8M KIT (HEMOSTASIS) ×2 IMPLANT
SUT ETHILON 3 0 PS 1 (SUTURE) ×2 IMPLANT
SUT MNCRL AB 4-0 PS2 18 (SUTURE) ×4 IMPLANT
SUT V-LOC BARB 180 2/0GR6 GS22 (SUTURE) ×8
SUT V-LOC BARB 180 2/0GR9 GS23 (SUTURE)
SUT VIC AB 0 CT1 27 (SUTURE) ×4
SUT VIC AB 0 CT1 27XBRD ANTBC (SUTURE) ×2 IMPLANT
SUT VICRYL 0 UR6 27IN ABS (SUTURE) ×4 IMPLANT
SUTURE V-LC BRB 180 2/0GR6GS22 (SUTURE) ×4 IMPLANT
SUTURE V-LC BRB 180 2/0GR9GS23 (SUTURE) IMPLANT
SYR BULB IRRIGATION 50ML (SYRINGE) IMPLANT
TOWEL OR NON WOVEN STRL DISP B (DISPOSABLE) ×2 IMPLANT
TRAY FOLEY CATH 14FRSI W/METER (CATHETERS) ×2 IMPLANT
TRAY LAP CHOLE (CUSTOM PROCEDURE TRAY) ×2 IMPLANT
TROCAR ENDOPATH XCEL 12X100 BL (ENDOMECHANICALS) ×2 IMPLANT
TROCAR XCEL 12X100 BLDLESS (ENDOMECHANICALS) ×2 IMPLANT
TUBING INSUFFLATION 10FT LAP (TUBING) ×2 IMPLANT
WATER STERILE IRR 1500ML POUR (IV SOLUTION) ×4 IMPLANT

## 2011-11-18 NOTE — Anesthesia Preprocedure Evaluation (Addendum)
Anesthesia Evaluation  Patient identified by MRN, date of birth, ID band Patient awake    Reviewed: Allergy & Precautions, H&P , NPO status , Patient's Chart, lab work & pertinent test results, reviewed documented beta blocker date and time   Airway Mallampati: II TM Distance: >3 FB Neck ROM: Full    Dental  (+) Upper Dentures and Dental Advisory Given   Pulmonary neg pulmonary ROS,  breath sounds clear to auscultation        Cardiovascular Rhythm:Regular Rate:Normal  Denies cardiac symptoms   Neuro/Psych negative neurological ROS  negative psych ROS   GI/Hepatic Neg liver ROS, GERD-  Controlled,  Endo/Other  negative endocrine ROS  Renal/GU Renal mass-left  negative genitourinary   Musculoskeletal negative musculoskeletal ROS (+)   Abdominal   Peds negative pediatric ROS (+)  Hematology Anemia, Hgb 11.8   Anesthesia Other Findings   Reproductive/Obstetrics negative OB ROS                          Anesthesia Physical Anesthesia Plan  ASA: II  Anesthesia Plan: General   Post-op Pain Management:    Induction:   Airway Management Planned: Oral ETT  Additional Equipment:   Intra-op Plan:   Post-operative Plan: Extubation in OR  Informed Consent: I have reviewed the patients History and Physical, chart, labs and discussed the procedure including the risks, benefits and alternatives for the proposed anesthesia with the patient or authorized representative who has indicated his/her understanding and acceptance.   Dental advisory given  Plan Discussed with: CRNA and Surgeon  Anesthesia Plan Comments:         Anesthesia Quick Evaluation

## 2011-11-18 NOTE — Transfer of Care (Signed)
Immediate Anesthesia Transfer of Care Note  Patient: [name]  Procedure(s) Performed: Procedure(s) (LRB): ROBOTIC ASSITED PARTIAL NEPHRECTOMY (Left)  Patient Location: PACU  Anesthesia Type: General  Level of Consciousness: awake and sedated  Airway & Oxygen Therapy: Patient Spontanous Breathing and Patient connected to face mask oxygen  Post-op Assessment: Report given to PACU RN and Post -op Vital signs reviewed and stable  Post vital signs: Reviewed and stable  Complications: No apparent anesthesia complications

## 2011-11-18 NOTE — Preoperative (Signed)
Beta Blockers   Reason not to administer Beta Blockers:Not Applicable 

## 2011-11-18 NOTE — Progress Notes (Signed)
Patient ID: Rashon Rezek, female   DOB: February 01, 1941, 71 y.o.   MRN: [id number]  Post-op note  Subjective: The patient is doing well.  No complaints.  Objective: Vital signs in last 24 hours: Temp:  [97 F (36.1 C)-98.6 F (37 C)] 97.7 F (36.5 C) (04/08 1624) Pulse Rate:  [64-86] 67  (04/08 1624) Resp:  [8-20] 12  (04/08 1624) BP: (125-176)/(64-100) 158/81 mmHg (04/08 1624) SpO2:  [99 %-100 %] 99 % (04/08 1624)  Intake/Output from previous day:   Intake/Output this shift: Total I/O In: 3300 [I.V.:3300] Out: 2020 [Urine:1900; Drains:70; Blood:50]  Physical Exam:  General: Alert and oriented. Abdomen: Soft, Nondistended. Incisions: Clean and dry.  Lab Results:  Basename 11/18/11 1505  HGB 12.3  HCT 37.0    Assessment/Plan: POD#0   1) Continue to monitor   Moody Bruins. MD   LOS: 0 days   Hildreth Robart,LES 11/18/2011, 5:33 PM

## 2011-11-18 NOTE — Progress Notes (Signed)
REPORT RECEIVED FOR CONTINUED PACU CARE

## 2011-11-18 NOTE — Telephone Encounter (Signed)
Pt's spouse called to remind MD that pt is having kidney surgery today at [facility].

## 2011-11-18 NOTE — Interval H&P Note (Signed)
History and Physical Interval Note:  11/18/2011 9:44 AM  [name]  has presented today for surgery, with the diagnosis of Left Renal Neoplasms  The various methods of treatment have been discussed with the patient and family. After consideration of risks, benefits and other options for treatment, the patient has consented to  Procedure(s) (LRB): LEFT ROBOTIC ASSITED PARTIAL NEPHRECTOMY (Left) as a surgical intervention .  The patients' history has been reviewed, patient examined, no change in status, stable for surgery.  I have reviewed the patients' chart and labs.  Questions were answered to the patient's satisfaction.     Kimberle Stanfill,LES

## 2011-11-18 NOTE — Anesthesia Postprocedure Evaluation (Signed)
  Anesthesia Post-op Note  Patient: [name]  Procedure(s) Performed: Procedure(s) (LRB): ROBOTIC ASSITED PARTIAL NEPHRECTOMY (Left)  Patient Location: PACU  Anesthesia Type: General  Level of Consciousness: oriented and sedated  Airway and Oxygen Therapy: Patient Spontanous Breathing and Patient connected to nasal cannula oxygen  Post-op Pain: mild  Post-op Assessment: Post-op Vital signs reviewed, Patient's Cardiovascular Status Stable, PATIENT'S CARDIOVASCULAR STATUS UNSTABLE and Patent Airway  Post-op Vital Signs: stable  Complications: No apparent anesthesia complications

## 2011-11-18 NOTE — Op Note (Signed)
Preoperative diagnosis: Left renal neoplasms  Postoperative diagnosis: Left renal neoplasms  Procedure:  1. Left robotic-assisted laparoscopic partial nephrectomy 2. Intraoperative renal ultrasonography  Surgeon: Moody Bruins. M.D.  Assistant(s): Pecola Leisure, PA-C  Anesthesia: General  Complications: None  EBL: 50 mL  IVF:  2400 mL crystalloid  Specimens: 1. Left upper pole renal neoplasm 2.   Left lower pole renal neoplasm  Disposition of specimens: Pathology  Intraoperative findings:       1. Warm renal ischemia time: 15 minutes       2. Intraoperative renal ultrasound findings: Ultrasonography of the upper pole renal mass was cystic and confirmed by ultrasound to represent the lesion of concern from MRI.  The lower pole mass was also confirmed to be consistent with the mass seen on MRI.  It appeared solid and concerning for a renal malignancy.  Drains: 1. # 15 Blake perinephric drain  Indication:  [name] is a 71 y.o. year old patient with a left renal neoplasm.  After a thorough review of the management options for their renal mass, they elected to proceed with surgical treatment and the above procedure.  We have discussed the potential benefits and risks of the procedure, side effects of the proposed treatment, the likelihood of the patient achieving the goals of the procedure, and any potential problems that might occur during the procedure or recuperation. Informed consent has been obtained.   Description of procedure:  The patient was taken to the operating room and a general anesthetic was administered. The patient was given preoperative antibiotics, placed in the left modified flank position with care to pad all potential pressure points, and prepped and draped in the usual sterile fashion. Next a preoperative timeout was performed.  A site was selected on the left side of the umbilicus for placement of the camera port. This was placed using a  standard open Hassan technique which allowed entry into the peritoneal cavity under direct vision and without difficulty. A 12 mm port was placed and a pneumoperitoneum established. The camera was then used to inspect the abdomen and there was no evidence of any intra-abdominal injuries or other abnormalities. The remaining abdominal ports were then placed. 8 mm robotic ports were placed in the left upper quadrant, left lower quadrant, and far left lateral abdominal wall. A 12 mm port was placed in the upper midline for laparoscopic assistance. All ports were placed under direct vision without difficulty. The surgical cart was then docked.   Utilizing the cautery scissors, the white line of Toldt was incised allowing the colon to be mobilized medially and the plane between the mesocolon and the anterior layer of Gerota's fascia to be developed and the kidney to be exposed.  The ureter and gonadal vein were identified inferiorly and the ureter was lifted anteriorly off the psoas muscle.  Dissection proceeded superiorly along the gonadal vein until the renal vein was identified.  The renal hilum was then carefully isolated with a combination of blunt and sharp dissection allowing the renal arterial and venous structures to be separated and isolated in preparation for renal hilar vessel clamping.  12.5 g of IV mannitol was then administered.   Attention turned to the kidney and the perinephric fat surrounding the renal mass was removed and the kidney was mobilized sufficiently for exposure and resection of the renal masses. A lower pole mass was identified medially and just adjacent to the ureter and was separated away from the ureter. A separate small  lesion was identified on the lateral aspect of the upper pole.  Intraoperative renal ultrasonography was utilized with the laparoscopic ultrasound probe to identify the renal tumors and identify the tumor margins.  The upper pole mass measured 1.0 cm and appeared  cystic.  The lower pole mass measured 1.2 cm in largest diameter and was solid and hyperechoic.  Once the renal masses were properly isolated, preparations were made for resection of the tumors.  Reconstructive sutures were placed into the abdomen for the renorrhaphy portion of the procedure.  The renal artery was then clamped with bulldog clamps.  The upper pole tumor was then excised with cold scissor dissection along with an adequate visible gross margin of normal renal parenchyma. The tumor appeared to be excised without any gross violation of the tumor. The renal collecting system was not entered during removal of the tumor. The lower pole mass was also resected in a similar fashion.  A running 2-0 V-lock suture was then brought through the capsule of the kidney and run along the base of each renal defect to provide hemostasis and close any entry into the renal collecting system if present. Weck clips were used to secure this suture outside the renal capsule at the proximal and distal ends. Early unclampling of the renal artery was then performed.The bulldog clamps were removed from the renal hilar vessel(s) and an additional 12.5 g of IV mannitol was administered. Total warm renal ischemia time was 15 minutes. SurgiFlo was then placed into the renal defect and the renal parenchyma was closed with a 2-0 V-lock horizontal mattress capsular suture which resulted in excellent compression of the renal defect.    The renal tumor resection site was examined. Hemostasis appeared adequate.   The kidney was placed back into its normal anatomic position and covered with perinephric fat as needed.  A # 15 Blake drain was then brought through the lateral lower port site and positioned in the perinephric space.  It was secured to the skin with a nylon suture. The surgical cart was undocked.  The renal tumor specimen was removed intact within an endopouch retrieval bag via the camera port sites.  The camera port site  and the other 12 mm port site were then closed at the fascial level with 0-vicryl suture.  All other laparoscopic/robotic ports were removed under direct vision and the pneumoperitoneum let down with inspection of the operative field performed and hemostasis again confirmed. All incision sites were then injected with local anesthetic and reapproximated at the skin level with 4-0 monocryl subcuticular closures.  Dermabond was applied to the skin.  The patient tolerated the procedure well and without complications.  The patient was able to be extubated and transferred to the recovery unit in satisfactory condition.  Moody Bruins MD

## 2011-11-18 NOTE — Progress Notes (Signed)
HGB. AND HCT. AND B MET DRAWN BY LAB 

## 2011-11-18 NOTE — Progress Notes (Signed)
Hgb. And B MET results noted

## 2011-11-19 LAB — BASIC METABOLIC PANEL
BUN: 8 mg/dL (ref 6–23)
CO2: 28 mEq/L (ref 19–32)
Calcium: 8.9 mg/dL (ref 8.4–10.5)
Chloride: 99 mEq/L (ref 96–112)
Creatinine, Ser: 0.64 mg/dL (ref 0.50–1.10)
GFR calc Af Amer: >90 mL/min (ref >90)
GFR calc non Af Amer: 88 mL/min — ABNORMAL LOW (ref >90)
Glucose, Bld: 206 mg/dL — ABNORMAL HIGH (ref 70–99)
Potassium: 3.4 mEq/L — ABNORMAL LOW (ref 3.5–5.1)
Sodium: 133 mEq/L — ABNORMAL LOW (ref 135–145)

## 2011-11-19 LAB — HEMOGLOBIN AND HEMATOCRIT, BLOOD
HCT: 32.4 % — ABNORMAL LOW (ref 36.0–46.0)
Hemoglobin: 10.7 g/dL — ABNORMAL LOW (ref 12.0–15.0)

## 2011-11-19 MED ORDER — OXYCODONE-ACETAMINOPHEN 5-325 MG PO TABS
1.0000 | ORAL_TABLET | ORAL | Status: DC | PRN
  Administered 2011-11-19 (×2): 1 via ORAL
  Administered 2011-11-19 – 2011-11-20 (×2): 2 via ORAL
  Administered 2011-11-20 (×2): 1 via ORAL
  Filled 2011-11-19: qty 1
  Filled 2011-11-19 (×3): qty 2
  Filled 2011-11-19 (×2): qty 1

## 2011-11-19 MED ORDER — HYDROCODONE-ACETAMINOPHEN 5-325 MG PO TABS
1.0000 | ORAL_TABLET | Freq: Four times a day (QID) | ORAL | Status: DC | PRN
  Administered 2011-11-19 (×2): 1 via ORAL
  Filled 2011-11-19 (×2): qty 1

## 2011-11-19 MED ORDER — BISACODYL 10 MG RE SUPP
10.0000 mg | Freq: Once | RECTAL | Status: AC
  Administered 2011-11-19: 10 mg via RECTAL
  Filled 2011-11-19: qty 1

## 2011-11-19 MED ORDER — DOCUSATE SODIUM 100 MG PO CAPS: 100.0000 mg | ORAL_CAPSULE | Freq: Two times a day (BID) | ORAL | Status: DC

## 2011-11-19 NOTE — Progress Notes (Signed)
Patient ID: [name], female   DOB: Aug 08, 1941, 71 y.o.   MRN: [id number]  1 Day Post-Op Subjective: The patient is doing well.  No nausea or vomiting. Pain is adequately controlled. She has noted a rash on her right hand without evidence of rash anywhere else.   Objective: Vital signs in last 24 hours: Temp:  [97 F (36.1 C)-98.6 F (37 C)] 98.5 F (36.9 C) (04/09 0510) Pulse Rate:  [64-86] 67  (04/09 0510) Resp:  [8-20] 18  (04/09 0510) BP: (109-176)/(57-100) 129/73 mmHg (04/09 0510) SpO2:  [98 %-100 %] 99 % (04/09 0510) Weight:  [61.689 kg (136 lb)] 61.689 kg (136 lb) (04/08 1624)  Intake/Output from previous day: 04/08 0701 - 04/09 0700 In: 5365 [P.O.:240; I.V.:4825; IV Piggyback:300] Out: 4816 [Urine:4625; Drains:141; Blood:50] Intake/Output this shift: Total I/O In: -  Out: 10 [Drains:10]  Physical Exam:  General: Alert and oriented. CV: RRR Lungs: Clear bilaterally. GI: Soft, Nondistended. Incisions: Clean and dry. Minimal ecchymosis. Urine: Clear Extremities: Nontender, no erythema, no edema. Her right hand indicates no sensory or motor deficit.  Her right hand does have a petechial rash without itching or tenderness or edema.  Lab Results:  Basename 11/19/11 0445 11/18/11 1505  HGB 10.7* 12.3  HCT 32.4* 37.0          Basename 11/19/11 0445 11/18/11 1505 11/12/11 1055  CREATININE 0.64 0.53 0.64           Results for orders placed during the hospital encounter of 11/18/11 (from the past 24 hour(s))  TYPE AND SCREEN     Status: Normal   Collection Time   11/18/11  9:00 AM      Component Value Range   ABO/RH(D) O POS     Antibody Screen NEG     Sample Expiration 11/21/2011    ABO/RH     Status: Normal   Collection Time   11/18/11  9:00 AM      Component Value Range   ABO/RH(D) O POS    BASIC METABOLIC PANEL     Status: Abnormal   Collection Time   11/18/11  3:05 PM      Component Value Range   Sodium 139  135 - 145 (mEq/L)   Potassium 3.6  3.5 -  5.1 (mEq/L)   Chloride 101  96 - 112 (mEq/L)   CO2 27  19 - 32 (mEq/L)   Glucose, Bld 150 (*) 70 - 99 (mg/dL)   BUN 9  6 - 23 (mg/dL)   Creatinine, Ser 4.09  0.50 - 1.10 (mg/dL)   Calcium 8.9  8.4 - 81.1 (mg/dL)   GFR calc non Af Amer >90  >90 (mL/min)   GFR calc Af Amer >90  >90 (mL/min)  HEMOGLOBIN AND HEMATOCRIT, BLOOD     Status: Normal   Collection Time   11/18/11  3:05 PM      Component Value Range   Hemoglobin 12.3  12.0 - 15.0 (g/dL)   HCT 91.4  78.2 - 95.6 (%)  BASIC METABOLIC PANEL     Status: Abnormal   Collection Time   11/19/11  4:45 AM      Component Value Range   Sodium 133 (*) 135 - 145 (mEq/L)   Potassium 3.4 (*) 3.5 - 5.1 (mEq/L)   Chloride 99  96 - 112 (mEq/L)   CO2 28  19 - 32 (mEq/L)   Glucose, Bld 206 (*) 70 - 99 (mg/dL)   BUN 8  6 - 23 (  mg/dL)   Creatinine, Ser 1.61  0.50 - 1.10 (mg/dL)   Calcium 8.9  8.4 - 09.6 (mg/dL)   GFR calc non Af Amer 88 (*) >90 (mL/min)   GFR calc Af Amer >90  >90 (mL/min)  HEMOGLOBIN AND HEMATOCRIT, BLOOD     Status: Abnormal   Collection Time   11/19/11  4:45 AM      Component Value Range   Hemoglobin 10.7 (*) 12.0 - 15.0 (g/dL)   HCT 04.5 (*) 40.9 - 46.0 (%)    Assessment/Plan: POD# 1 s/p robotic partial nephrectomy.  1) Ambulate, Incentive spirometry 2) Advance diet as tolerated 3) Transition to oral pain medication 4) Dulcolax suppository 5) D/C urethral catheter 6) Monitor right hand rash.  Do not suspect any clinically significant issues but will monitor for drug-related rash.   Rolly Salter, Montez Hageman. MD   LOS: 1 day   Kalen Ratajczak,LES 11/19/2011, 8:27 AM

## 2011-11-19 NOTE — Plan of Care (Signed)
Problem: Phase II Progression Outcomes Goal: OOB to chair, ambulate in room BID Outcome: Completed/Met Date Met:  11/19/11 Pt ambulated in hallway twice this shift. Pt tolerated well, will continue to monitor pt.

## 2011-11-19 NOTE — Progress Notes (Signed)
No changes from day shift assessment. S.Kathrine Rieves RN

## 2011-11-19 NOTE — Progress Notes (Signed)
Pt. Has a new reddend rash to right hand with slight joint edema.  Pt. Denied and SOB, or allergy signs and symptoms.  Prn Benadryl given, will continue to monitor pt.

## 2011-11-19 NOTE — Progress Notes (Signed)
Itching noted to right hand.

## 2011-11-20 LAB — HEMOGLOBIN AND HEMATOCRIT, BLOOD
HCT: 33.9 % — ABNORMAL LOW (ref 36.0–46.0)
Hemoglobin: 11 g/dL — ABNORMAL LOW (ref 12.0–15.0)

## 2011-11-20 LAB — BASIC METABOLIC PANEL
BUN: 6 mg/dL (ref 6–23)
CO2: 31 mEq/L (ref 19–32)
Calcium: 8.8 mg/dL (ref 8.4–10.5)
Chloride: 103 mEq/L (ref 96–112)
Creatinine, Ser: 0.54 mg/dL (ref 0.50–1.10)
GFR calc Af Amer: >90 mL/min (ref >90)
GFR calc non Af Amer: >90 mL/min (ref >90)
Glucose, Bld: 96 mg/dL (ref 70–99)
Potassium: 3 mEq/L — ABNORMAL LOW (ref 3.5–5.1)
Sodium: 140 mEq/L (ref 135–145)

## 2011-11-20 LAB — CREATININE, FLUID (PLEURAL, PERITONEAL, JP DRAINAGE): Creat, Fluid: 0.6 mg/dL

## 2011-11-20 MED ORDER — MENTHOL 3 MG MT LOZG
1.0000 | LOZENGE | OROMUCOSAL | Status: DC | PRN
  Filled 2011-11-20: qty 9

## 2011-11-20 MED ORDER — POTASSIUM CHLORIDE CRYS ER 20 MEQ PO TBCR
40.0000 meq | EXTENDED_RELEASE_TABLET | Freq: Once | ORAL | Status: AC
  Administered 2011-11-20: 40 meq via ORAL
  Filled 2011-11-20: qty 2

## 2011-11-20 MED ORDER — PHENOL 1.4 % MT LIQD
1.0000 | OROMUCOSAL | Status: DC | PRN
  Filled 2011-11-20: qty 177

## 2011-11-20 MED ORDER — DOCUSATE SODIUM 100 MG PO CAPS: 100.0000 mg | ORAL_CAPSULE | Freq: Two times a day (BID) | ORAL | Status: AC

## 2011-11-20 MED ORDER — OXYCODONE-ACETAMINOPHEN 5-325 MG PO TABS: 1.0000 | ORAL_TABLET | ORAL | Status: AC | PRN

## 2011-11-20 NOTE — Progress Notes (Signed)
11/20/11 Patient had the JP  and PIV removed. Patient has ambulated several times today in hallway. Patient is eager to return home. Will monitor patient until discharge.

## 2011-11-20 NOTE — Discharge Instructions (Signed)
1. Activity:  You are encouraged to ambulate frequently (about every hour during waking hours) to help prevent blood clots from forming in your legs or lungs.  However, you should not engage in any heavy lifting (> 10-15 lbs), strenuous activity, or straining. 2. Diet: You should continue a clear liquid diet until passing gas from below.  Once this occurs, you may advance your diet to a soft diet that would be easy to digest (i.e soups, scrambled eggs, mashed potatoes, etc.) for 24 hours just as you would if getting over a bad stomach flu.  If tolerating this diet well for 24 hours, you may then begin eating regular food.  It will be normal to have some amount of bloating, nausea, and abdominal discomfort intermittently. 3. Prescriptions:  You will be provided a prescription for pain medication to take as needed.  If your pain is not severe enough to require the prescription pain medication, you may take extra strength Tylenol instead.  You should also take an over the counter stool softener (Colace 100 mg twice daily) to avoid straining with bowel movements as the pain medication may constipate you. 4. Incisions: You may remove your dressing bandages the 2nd day after surgery.  Once the bandages are removed, the incisions may stay open to air.  You may start showering (not soaking or bathing in water) 48 hours after surgery and the incisions simply need to be patted dry after the shower.  No additional care is needed. 5. What to call us about: You should call the office [phone]) if you develop fever > 101, persistent vomiting, or the catheter stops draining. Also, feel free to call with any other questions you may have and remember the handout that was provided to you as a reference preoperatively which answers many of the common questions that arise after surgery.  You may resume aspirin and vitamins 7 days after surgery.

## 2011-11-20 NOTE — Progress Notes (Signed)
Patient ID: [name], female   DOB: October 14, 1940, 71 y.o.   MRN: [id number]  2 Days Post-Op Subjective: The patient is doing well.  No nausea or vomiting. Pain is adequately controlled. She had a bowel movement this morning.  Pain medication switched to Percocet yesterday with improved control.  Objective: Vital signs in last 24 hours: Temp:  [97.6 F (36.4 C)-98.3 F (36.8 C)] 97.6 F (36.4 C) (04/10 0653) Pulse Rate:  [70-82] 82  (04/10 0653) Resp:  [16-18] 18  (04/10 0653) BP: (124-163)/(57-84) 153/84 mmHg (04/10 0653) SpO2:  [95 %-100 %] 95 % (04/10 0653)  Intake/Output from previous day: 04/09 0701 - 04/10 0700 In: 1620 [P.O.:720; I.V.:900] Out: 3770 [Urine:3625; Drains:145] Intake/Output this shift:    Physical Exam:  General: Alert and oriented. CV: RRR Lungs: Clear bilaterally. GI: Soft, Nondistended. Incisions: Clean and dry. Mild ecchymosis. Urine: Clear Extremities: Nontender, no erythema, no edema. Rash on right hand is unchanged.  Still no edema or other symptoms.  No neurologic deficits.  Lab Results:  Basename 11/20/11 0443 11/19/11 0445 11/18/11 1505  HGB 11.0* 10.7* 12.3  HCT 33.9* 32.4* 37.0          Basename 11/20/11 0443 11/19/11 0445 11/18/11 1505  CREATININE 0.54 0.64 0.53           Results for orders placed during the hospital encounter of 11/18/11 (from the past 24 hour(s))  HEMOGLOBIN AND HEMATOCRIT, BLOOD     Status: Abnormal   Collection Time   11/20/11  4:43 AM      Component Value Range   Hemoglobin 11.0 (*) 12.0 - 15.0 (g/dL)   HCT 40.9 (*) 81.1 - 46.0 (%)  BASIC METABOLIC PANEL     Status: Abnormal   Collection Time   11/20/11  4:43 AM      Component Value Range   Sodium 140  135 - 145 (mEq/L)   Potassium 3.0 (*) 3.5 - 5.1 (mEq/L)   Chloride 103  96 - 112 (mEq/L)   CO2 31  19 - 32 (mEq/L)   Glucose, Bld 96  70 - 99 (mg/dL)   BUN 6  6 - 23 (mg/dL)   Creatinine, Ser 9.14  0.50 - 1.10 (mg/dL)   Calcium 8.8  8.4 - 78.2 (mg/dL)    GFR calc non Af Amer >90  >90 (mL/min)   GFR calc Af Amer >90  >90 (mL/min)    Assessment/Plan: POD# 2 s/p robotic partial nephrectomy.  1) Ambulate, Incentive spirometry 2) Check drain creatinine level 3) Sl IVF 4) Replace K+ 5) Likely D/C home this morning  Moody Bruins. MD   LOS: 2 days   Caleah Tortorelli,LES 11/20/2011, 7:31 AM

## 2011-11-22 NOTE — Discharge Summary (Signed)
Date of admission: 11/18/2011  Date of discharge: 11/20/11  Admission diagnosis: Two left renal masses  Discharge diagnosis: angiomyolipoma and papillary RCC  Secondary diagnoses: arthritis, GERD  History and Physical: For full details, please see admission history and physical. Briefly, [name] is a 71 y.o. year old patient who developed abdominal pain in early February 2013 prompting a CT scan of the abdomen to be performed. The cause for her abdominal pain was not determined on her imaging but she was noted to have two concerning lesions of her left kidney which were not consistent with simple cysts. Further imaging with MRI indicated an 11 x 9 mm enhancing lesion toward the medial lower pole of the left kidney just below the level of the renal pelvis which is mostly exophytic and a second lesion measuring 9 x 9 mm which is more equivocal in the lateral interpolar region of the left kidney and mostly endophytic. She has had no hematuria or weight loss. She has no family history of kidney cancer. She was seen by Dr. Fredia Sorrow in consultation to consider percutaneous ablation and was then recommended to be evaluated for possible nephron sparing surgery. She had a chest x-ray a couple of weeks ago. She specifically states that she has been told she had a small questionable nodule seen on prior chest imaging however. She has not undergone axial imaging of the chest.  Her abdominal pain has resolved over the past few weeks.  Her imaging studies were reviewed and there is no evidence of regional lymphadenopathy, contralateral renal masses, no adrenal lesions, and no other evidence of metastatic disease. 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 Baseline renal function: Cr 0.66, eGFR > 60 ml/min.  Pt was felt to be a good candidate for robotic assisted left partial nephrectomy.  Hospital Course: Pt was admitted and taken to the OR on 11/18/11 for a robotic  assisted left partial nephrectomy for two separate lesions.  Pt tolerated the procedure well and was hemodynamically stable immediately post op.  She was extubated without complication and woke up from anesthesia neurologically intact.  She was transferred from the PACU to the floor without difficulty.  Her post op course progressed as expected with the exception of the development of a petechial rash on her right hand POD1.  This was non painful, did not blanch, and did not itch.  It was monitored closely and did not spread.  Her vitals remained stable throughout the post op course.  She was able to ambulate, void, and tolerate a regular diet.  She also had a BM.  On POD 2 pt was doing well and felt stable for discharge home.    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   Discharge medications:  Medication List  As of 11/22/2011 10:12 AM   START taking these medications         docusate sodium 100 MG capsule   Commonly known as: COLACE   Take 1 capsule (100 mg total) by mouth 2 (two) times daily.      oxyCODONE-acetaminophen 5-325 MG per tablet   Commonly known as: PERCOCET   Take 1-2  tablets by mouth every 4 (four) hours as needed (1 tablet for pain scale 1-5, 2 tablets for pain scale 6-10).         CONTINUE taking these medications         ALPRAZolam 0.25 MG tablet   Commonly known as: XANAX      amitriptyline 50 MG tablet   Commonly known as: ELAVIL      omeprazole 20 MG capsule   Commonly known as: PRILOSEC   Take 2 capsules (40 mg total) by mouth daily.      polyethylene glycol packet   Commonly known as: MIRALAX / GLYCOLAX      sucralfate 1 G tablet    Commonly known as: CARAFATE         STOP taking these medications         calcium carbonate 500 MG chewable tablet      fish oil-omega-3 fatty acids 1000 MG capsule      MULTIPLE VITAMIN PO      traMADol 50 MG tablet          Where to get your medications    These are the prescriptions that you need to pick up.   You may get these medications from any pharmacy.         docusate sodium 100 MG capsule   oxyCODONE-acetaminophen 5-325 MG per tablet            Followup:  Follow-up Information    Follow up with Crecencio Mc, MD on 12/17/2011. (at 10:15)    Contact information:   [address] [place] [facility] [place] [address] [phone]

## 2011-12-03 ENCOUNTER — Telehealth: Payer: Self-pay

## 2011-12-03 ENCOUNTER — Other Ambulatory Visit: Payer: Self-pay

## 2011-12-03 MED ORDER — POLYETHYLENE GLYCOL 3350 17 G PO PACK: 17.0000 g | PACK | Freq: Every day | ORAL | Status: DC

## 2011-12-03 NOTE — Telephone Encounter (Signed)
error 

## 2012-01-09 ENCOUNTER — Encounter (HOSPITAL_COMMUNITY): Payer: Self-pay | Admitting: Emergency Medicine

## 2012-01-09 ENCOUNTER — Emergency Department (HOSPITAL_COMMUNITY): Payer: Medicare Other

## 2012-01-09 ENCOUNTER — Observation Stay (HOSPITAL_COMMUNITY)
Admission: EM | Admit: 2012-01-09 | Discharge: 2012-01-11 | Disposition: A | Discharge status: Home / Self Care | Payer: Medicare Other | Attending: Internal Medicine | Admitting: Internal Medicine

## 2012-01-09 DIAGNOSIS — R0602 Shortness of breath: Secondary | ICD-10-CM | POA: Insufficient documentation

## 2012-01-09 DIAGNOSIS — F411 Generalized anxiety disorder: Secondary | ICD-10-CM

## 2012-01-09 DIAGNOSIS — K219 Gastro-esophageal reflux disease without esophagitis: Secondary | ICD-10-CM | POA: Insufficient documentation

## 2012-01-09 DIAGNOSIS — Z905 Acquired absence of kidney: Secondary | ICD-10-CM | POA: Insufficient documentation

## 2012-01-09 DIAGNOSIS — R0789 Other chest pain: Secondary | ICD-10-CM | POA: Insufficient documentation

## 2012-01-09 DIAGNOSIS — R51 Headache: Secondary | ICD-10-CM | POA: Insufficient documentation

## 2012-01-09 DIAGNOSIS — R079 Chest pain, unspecified: Secondary | ICD-10-CM

## 2012-01-09 DIAGNOSIS — I10 Essential (primary) hypertension: Principal | ICD-10-CM | POA: Insufficient documentation

## 2012-01-09 DIAGNOSIS — M545 Low back pain: Secondary | ICD-10-CM | POA: Diagnosis present

## 2012-01-09 LAB — COMPREHENSIVE METABOLIC PANEL
ALT: 18 U/L (ref 0–35)
AST: 26 U/L (ref 0–37)
Albumin: 4.1 g/dL (ref 3.5–5.2)
Alkaline Phosphatase: 68 U/L (ref 39–117)
BUN: 13 mg/dL (ref 6–23)
CO2: 23 mEq/L (ref 19–32)
Calcium: 9.4 mg/dL (ref 8.4–10.5)
Chloride: 100 mEq/L (ref 96–112)
Creatinine, Ser: 0.61 mg/dL (ref 0.50–1.10)
GFR calc Af Amer: >90 mL/min (ref >90)
GFR calc non Af Amer: 90 mL/min — ABNORMAL LOW (ref >90)
Glucose, Bld: 95 mg/dL (ref 70–99)
Potassium: 3.8 mEq/L (ref 3.5–5.1)
Sodium: 135 mEq/L (ref 135–145)
Total Bilirubin: 0.2 mg/dL — ABNORMAL LOW (ref 0.3–1.2)
Total Protein: 7.1 g/dL (ref 6.0–8.3)

## 2012-01-09 LAB — CBC
HCT: 34.1 % — ABNORMAL LOW (ref 36.0–46.0)
Hemoglobin: 11.5 g/dL — ABNORMAL LOW (ref 12.0–15.0)
MCH: 29.9 pg (ref 26.0–34.0)
MCHC: 33.7 g/dL (ref 30.0–36.0)
MCV: 88.6 fL (ref 78.0–100.0)
Platelets: 312 10*3/uL (ref 150–400)
RBC: 3.85 MIL/uL — ABNORMAL LOW (ref 3.87–5.11)
RDW: 12.6 % (ref 11.5–15.5)
WBC: 6 10*3/uL (ref 4.0–10.5)

## 2012-01-09 LAB — DIFFERENTIAL
Basophils Absolute: 0 10*3/uL (ref 0.0–0.1)
Basophils Relative: 1 % (ref 0–1)
Eosinophils Absolute: 0.1 10*3/uL (ref 0.0–0.7)
Eosinophils Relative: 2 % (ref 0–5)
Lymphocytes Relative: 40 % (ref 12–46)
Lymphs Abs: 2.4 10*3/uL (ref 0.7–4.0)
Monocytes Absolute: 0.5 10*3/uL (ref 0.1–1.0)
Monocytes Relative: 9 % (ref 3–12)
Neutro Abs: 2.9 10*3/uL (ref 1.7–7.7)
Neutrophils Relative %: 49 % (ref 43–77)

## 2012-01-09 LAB — TROPONIN I: Troponin I: <0.3 ng/mL (ref <0.30)

## 2012-01-09 MED ORDER — SODIUM CHLORIDE 0.9 % IJ SOLN
3.0000 mL | Freq: Two times a day (BID) | INTRAMUSCULAR | Status: DC
  Administered 2012-01-10 (×3): 3 mL via INTRAVENOUS

## 2012-01-09 MED ORDER — PANTOPRAZOLE SODIUM 40 MG PO TBEC
40.0000 mg | DELAYED_RELEASE_TABLET | Freq: Every day | ORAL | Status: DC
  Administered 2012-01-10 (×2): 40 mg via ORAL
  Filled 2012-01-09 (×3): qty 1

## 2012-01-09 MED ORDER — CALCIUM CARBONATE-VITAMIN D 500-200 MG-UNIT PO TABS
1.0000 | ORAL_TABLET | Freq: Every day | ORAL | Status: DC
  Administered 2012-01-10: 1 via ORAL
  Filled 2012-01-09 (×2): qty 1

## 2012-01-09 MED ORDER — ASPIRIN 81 MG PO CHEW
324.0000 mg | CHEWABLE_TABLET | Freq: Once | ORAL | Status: AC
  Administered 2012-01-09: 324 mg via ORAL
  Filled 2012-01-09: qty 4

## 2012-01-09 MED ORDER — ALPRAZOLAM 0.25 MG PO TABS
0.2500 mg | ORAL_TABLET | Freq: Three times a day (TID) | ORAL | Status: DC | PRN
  Administered 2012-01-10 (×2): 0.25 mg via ORAL
  Filled 2012-01-09 (×2): qty 1

## 2012-01-09 MED ORDER — METOPROLOL TARTRATE 25 MG PO TABS
25.0000 mg | ORAL_TABLET | Freq: Two times a day (BID) | ORAL | Status: DC
  Administered 2012-01-10: 25 mg via ORAL
  Filled 2012-01-09 (×3): qty 1

## 2012-01-09 MED ORDER — ENOXAPARIN SODIUM 40 MG/0.4ML ~~LOC~~ SOLN
40.0000 mg | Freq: Every day | SUBCUTANEOUS | Status: DC
  Filled 2012-01-09 (×3): qty 0.4

## 2012-01-09 MED ORDER — ONDANSETRON HCL 4 MG/2ML IJ SOLN: 4.0000 mg | Freq: Four times a day (QID) | INTRAMUSCULAR | Status: DC | PRN

## 2012-01-09 MED ORDER — ONDANSETRON HCL 4 MG PO TABS: 4.0000 mg | ORAL_TABLET | Freq: Four times a day (QID) | ORAL | Status: DC | PRN

## 2012-01-09 MED ORDER — SODIUM CHLORIDE 0.9 % IV SOLN: 250.0000 mL | INTRAVENOUS | Status: DC | PRN

## 2012-01-09 MED ORDER — SODIUM CHLORIDE 0.9 % IJ SOLN: 3.0000 mL | Freq: Two times a day (BID) | INTRAMUSCULAR | Status: DC

## 2012-01-09 MED ORDER — AMITRIPTYLINE HCL 50 MG PO TABS: 50.0000 mg | ORAL_TABLET | Freq: Every day | ORAL | Status: DC

## 2012-01-09 MED ORDER — MORPHINE SULFATE 2 MG/ML IJ SOLN: 2.0000 mg | INTRAMUSCULAR | Status: DC | PRN

## 2012-01-09 MED ORDER — TRAMADOL HCL 50 MG PO TABS
100.0000 mg | ORAL_TABLET | Freq: Four times a day (QID) | ORAL | Status: DC | PRN
  Administered 2012-01-10 (×3): 100 mg via ORAL
  Filled 2012-01-09 (×3): qty 2

## 2012-01-09 MED ORDER — SUCRALFATE 1 G PO TABS
1.0000 g | ORAL_TABLET | Freq: Three times a day (TID) | ORAL | Status: DC
  Administered 2012-01-10: 1 g via ORAL
  Filled 2012-01-09 (×9): qty 1

## 2012-01-09 MED ORDER — POLYETHYLENE GLYCOL 3350 17 G PO PACK
17.0000 g | PACK | Freq: Every day | ORAL | Status: DC
  Administered 2012-01-10: 17 g via ORAL
  Filled 2012-01-09 (×2): qty 1

## 2012-01-09 MED ORDER — ASPIRIN 325 MG PO TABS
325.0000 mg | ORAL_TABLET | Freq: Every day | ORAL | Status: DC
  Administered 2012-01-10: 325 mg via ORAL
  Filled 2012-01-09 (×2): qty 1

## 2012-01-09 MED ORDER — AMITRIPTYLINE HCL 50 MG PO TABS
50.0000 mg | ORAL_TABLET | Freq: Every day | ORAL | Status: DC
  Administered 2012-01-10 (×2): 50 mg via ORAL
  Filled 2012-01-09 (×4): qty 1

## 2012-01-09 MED ORDER — NITROGLYCERIN IN D5W 200-5 MCG/ML-% IV SOLN
5.0000 ug/min | INTRAVENOUS | Status: DC
  Administered 2012-01-09: 5 ug/min via INTRAVENOUS
  Filled 2012-01-09: qty 250

## 2012-01-09 MED ORDER — ZOLPIDEM TARTRATE 5 MG PO TABS: 5.0000 mg | ORAL_TABLET | Freq: Every evening | ORAL | Status: DC | PRN

## 2012-01-09 MED ORDER — SODIUM CHLORIDE 0.9 % IJ SOLN: 3.0000 mL | INTRAMUSCULAR | Status: DC | PRN

## 2012-01-09 MED ORDER — NITROGLYCERIN 0.4 MG SL SUBL
0.4000 mg | SUBLINGUAL_TABLET | SUBLINGUAL | Status: AC | PRN
  Administered 2012-01-09 (×3): 0.4 mg via SUBLINGUAL
  Filled 2012-01-09: qty 25

## 2012-01-09 NOTE — ED Provider Notes (Signed)
History     CSN: [id number]  Arrival date & time 01/09/12  8295   First MD Initiated Contact with Patient 01/09/12 1909      Chief Complaint  Patient presents with  . Chest Pain    (Consider location/radiation/quality/duration/timing/severity/associated sxs/prior treatment) HPI  Patient with headache pounding in nature for three days pounding in nature moderate gradual onset.  Describes as sore.  Took tylenol without relief.  Off and on for three days.  Patient states bp elevation noted by neighbor who is nurse and took it today.  BP 175/102.  Continued elevated on multiple rechecks.  Patient states she has had intermittent hbp in past attributed to pain.  Chest tight last night and off and on today.  Tightness noted anterior chest with some left arm sore.  No sob, no dizziness, nausea, or light headedness.    Past Medical History  Diagnosis Date  . Intention tremor   . Abdominal pain, epigastric   . Constipation   . PUD (peptic ulcer disease)   . Helicobacter pylori gastritis   . Constipation   . PVD (peripheral vascular disease)   . Lumbago   . Osteoarthritis of spine   . Dyspepsia   . Fibromyalgia   . Pneumonia     hx of years ago   . Chronic kidney disease   . GERD (gastroesophageal reflux disease)     Past Surgical History  Procedure Date  . Lumbar laminectomy   . Lumbar fusion   . Tubal ligation   . Tonsillectomy   . Breast enhancement surgery   . Abdominal hysterectomy     Family History  Problem Relation Age of Onset  . Heart disease Mother   . Diabetes Father   . Heart disease Father   . Heart disease Brother   . Diabetes Daughter     History  Substance Use Topics  . Smoking status: Never Smoker   . Smokeless tobacco: Never Used  . Alcohol Use: No     occasional galss of wine with dinner     OB History    Grav Para Term Preterm Abortions TAB SAB Ect Mult Living                  Review of Systems  Constitutional: Negative.   HENT:  Negative.   Eyes: Negative.   Respiratory: Positive for chest tightness.   Cardiovascular: Negative.   Genitourinary: Negative.   Musculoskeletal: Positive for back pain.  Neurological: Negative.   Hematological: Negative.   Psychiatric/Behavioral: Negative.     Allergies  Morphine and related  Home Medications   Current Outpatient Rx  Name Route Sig Dispense Refill  . ALPRAZOLAM 0.25 MG PO TABS Oral Take 0.25 mg by mouth 3 (three) times daily as needed. Anxiety     . AMITRIPTYLINE HCL 50 MG PO TABS  50 mg at bedtime. TAKE 1 TABLET BY MOUTH EVERY NIGHT AT BEDTIME    . OMEPRAZOLE 20 MG PO CPDR Oral Take 2 capsules (40 mg total) by mouth daily. 60 capsule 5  . POLYETHYLENE GLYCOL 3350 PO PACK Oral Take 17 g by mouth daily. 14 each 2  . SUCRALFATE 1 G PO TABS  1 g. Take as crushed tablet in water (suspension) before meals and bedtime      BP 193/83  Pulse 66  Temp(Src) 98.7 F (37.1 C) (Oral)  Resp 18  SpO2 100%  Physical Exam  Nursing note and vitals reviewed. Constitutional: She appears well-developed and  well-nourished.  HENT:  Head: Normocephalic and atraumatic.  Eyes: Conjunctivae and EOM are normal. Pupils are equal, round, and reactive to light.  Neck: Normal range of motion. Neck supple.  Cardiovascular: Normal rate, regular rhythm, normal heart sounds and intact distal pulses.   Pulmonary/Chest: Effort normal and breath sounds normal.  Abdominal: Soft. Bowel sounds are normal.  Musculoskeletal: Normal range of motion.  Neurological: She is alert.  Skin: Skin is warm and dry.  Psychiatric: She has a normal mood and affect. Thought content normal.    ED Course  Procedures (including critical care time)  Labs Reviewed - No data to display No results found.   No diagnosis found.    Date: 01/09/2012  Rate: 60  Rhythm: normal sinus rhythm  QRS Axis: normal  Intervals: normal  ST/T Wave abnormalities: normal and qwaves inferior and lateral   Conduction  Disutrbances:none  Narrative Interpretation:   Old EKG Reviewed: none available   MDM  Patient with decreased bp and chest pain with nitro slx3 and nitro drip started.  First set troponin negative.  Headache resolved and chest pain at 1.   Discussed with Dr. Nedra Hai and patient to be admitted to Dr.Norins.       Hilario Quarry, MD 01/09/12 [phone]

## 2012-01-09 NOTE — ED Notes (Signed)
After SL Nitro x 3, pt reported that chest pressure dropped to a 2 from a 4, pt now reports the pressure has returned to a 4/10. MD Ray notified

## 2012-01-09 NOTE — ED Notes (Signed)
Pt presenting to ed with c/o chest tightness since yesterday pt denies nausea, vomiting, and shortness of breath. Pt states her blood pressure has been fluctuating lately. Pt states she has had a headache x 4 days. Pt states her neighbor is a Engineer, civil (consulting) and she took her blood pressure and it was reading high. Pt is alert and oriented  At this time.

## 2012-01-09 NOTE — ED Notes (Signed)
Pt states she was in pain, because the EKG electrode sticker was placed right on top of her incision for kidney surgery done 6 weeks ago. Electrode sticker removed. Incision site looks clean with no bleeding.

## 2012-01-09 NOTE — ED Notes (Signed)
MD at bedside. 

## 2012-01-10 DIAGNOSIS — Z09 Encounter for follow-up examination after completed treatment for conditions other than malignant neoplasm: Secondary | ICD-10-CM

## 2012-01-10 DIAGNOSIS — I517 Cardiomegaly: Secondary | ICD-10-CM

## 2012-01-10 DIAGNOSIS — R079 Chest pain, unspecified: Secondary | ICD-10-CM

## 2012-01-10 LAB — CBC
HCT: 34.1 % — ABNORMAL LOW (ref 36.0–46.0)
Hemoglobin: 11.4 g/dL — ABNORMAL LOW (ref 12.0–15.0)
MCH: 30 pg (ref 26.0–34.0)
MCHC: 33.4 g/dL (ref 30.0–36.0)
MCV: 89.7 fL (ref 78.0–100.0)
Platelets: 301 10*3/uL (ref 150–400)
RBC: 3.8 MIL/uL — ABNORMAL LOW (ref 3.87–5.11)
RDW: 12.7 % (ref 11.5–15.5)
WBC: 5.8 10*3/uL (ref 4.0–10.5)

## 2012-01-10 LAB — BASIC METABOLIC PANEL
BUN: 12 mg/dL (ref 6–23)
CO2: 25 mEq/L (ref 19–32)
Calcium: 9.2 mg/dL (ref 8.4–10.5)
Chloride: 100 mEq/L (ref 96–112)
Creatinine, Ser: 0.6 mg/dL (ref 0.50–1.10)
GFR calc Af Amer: >90 mL/min (ref >90)
GFR calc non Af Amer: >90 mL/min (ref >90)
Glucose, Bld: 118 mg/dL — ABNORMAL HIGH (ref 70–99)
Potassium: 3.5 mEq/L (ref 3.5–5.1)
Sodium: 134 mEq/L — ABNORMAL LOW (ref 135–145)

## 2012-01-10 LAB — CARDIAC PANEL(CRET KIN+CKTOT+MB+TROPI)
CK, MB: 2 ng/mL (ref 0.3–4.0)
CK, MB: 2.1 ng/mL (ref 0.3–4.0)
CK, MB: 2.2 ng/mL (ref 0.3–4.0)
Relative Index: INVALID (ref 0.0–2.5)
Relative Index: INVALID (ref 0.0–2.5)
Relative Index: INVALID (ref 0.0–2.5)
Total CK: 46 U/L (ref 7–177)
Total CK: 47 U/L (ref 7–177)
Total CK: 54 U/L (ref 7–177)
Troponin I: <0.3 ng/mL (ref <0.30)
Troponin I: <0.3 ng/mL (ref <0.30)
Troponin I: <0.3 ng/mL (ref <0.30)

## 2012-01-10 LAB — TSH: TSH: 4.103 u[IU]/mL (ref 0.350–4.500)

## 2012-01-10 MED ORDER — METOPROLOL SUCCINATE ER 50 MG PO TB24
50.0000 mg | ORAL_TABLET | Freq: Every day | ORAL | Status: DC
  Administered 2012-01-10: 50 mg via ORAL
  Filled 2012-01-10 (×2): qty 1

## 2012-01-10 NOTE — Progress Notes (Signed)
Subjective: Mrs. Randleman is well known to me. She has recently had partial nephrectomy for malignant tumor. She presented due to very high blood pressure, headache and chest pain. CT brain was unremarkable. She was admitted to r/o MI and for management of BP  She is feeling much better this Am but still has a bit of a headache.  Objective: Lab: Lab Results  Component Value Date   WBC 5.8 01/10/2012   HGB 11.4* 01/10/2012   HCT 34.1* 01/10/2012   MCV 89.7 01/10/2012   PLT 301 01/10/2012   BMET    Component Value Date/Time   NA 134* 01/10/2012 0740   K 3.5 01/10/2012 0740   CL 100 01/10/2012 0740   CO2 25 01/10/2012 0740   GLUCOSE 118* 01/10/2012 0740   BUN 12 01/10/2012 0740   CREATININE 0.60 01/10/2012 0740   CALCIUM 9.2 01/10/2012 0740   GFRNONAA >90 01/10/2012 0740   GFRAA >90 01/10/2012 0740    Cardiac Panel (last 3 results)  Basename 01/10/12 0740 01/10/12 0016 01/09/12 1930  CKTOTAL 47 54 --  CKMB 2.0 2.1 --  TROPONINI <0.30 <0.30 <0.30  RELINDX RELATIVE INDEX IS INVALID RELATIVE INDEX IS INVALID --  \  Imaging: CT brain - negative study CXR : IMPRESSION:  Interval borderline cardiomegaly and minimal changes of acute  congestive heart failure.   Physical Exam: Filed Vitals:   01/10/12 0512  BP: 143/77  Pulse: 52  Temp: 98.1 F (36.7 C)  Resp: 18   Gen'l - WNWD white woman in no distress HEENT- C&S clear, PERRLA Cor- 2+ radial pulse, RRR w/o murmur Pulm - normal respirations Neuor- A&O x 3, CN II-XII grossly normal     Assessment/plan 1. Cardiac - initial cardiac enzymes are normal Plan - complete eval w/ last set of cardiac enzymes, 2 D echo  2. HTN- sudden on-set with recent renal surgery raises concern for RAS Plan - Renal U/S with dopplers, r/o RAS  3. Neuro - non-focal exam and history.   Illene Regulus 01/10/2012, 9:41 AM

## 2012-01-10 NOTE — Progress Notes (Signed)
*  PRELIMINARY RESULTS* Vascular Ultrasound Renal Artery Duplex has been completed.  Preliminary findings: Bilaterally no evidence of renal artery stenosis.  Farrel Demark, RDMS 01/10/2012, 3:49 PM

## 2012-01-10 NOTE — H&P (Signed)
PCP:   Illene Regulus, MD, MD   Chief Complaint: Severe hypertension and chest pain   HPI: [name] is an 70 y.o. female with multiple medical problems, patient of Dr. Illene Regulus, with recent partial nephrectomy for a benign mass, general anxiety disorder, chronic low back pain and fibromyalgia, history of GERD, but no known coronary disease, borderline hypertension not on medication, was feeling substernal chest tightness with a headache, found to have systolic blood pressure of 202/102 measured by her friend who is an Charity fundraiser. She denied nausea vomiting or shortness of breath. In the emergency room she was given nitroglycerin sublingually with improvement of her headache and chest tightness. Her blood pressure was found to be elevated of 160 systolic. Further workup included negative cardiac markers, unremarkable EKG, and a chest x-ray showed borderline congestive heart failure and cardiomegaly. She was started on IV nitroglycerin drip, given aspirin, and hospitalist was asked to admit her for cardiac rule out  Rewiew of Systems:  The patient denies anorexia, fever, weight loss,, vision loss, decreased hearing, hoarseness, , syncope, dyspnea on exertion, peripheral edema, balance deficits, hemoptysis, abdominal pain, melena, hematochezia, severe indigestion/heartburn, hematuria, incontinence, genital sores, muscle weakness, suspicious skin lesions, transient blindness, difficulty walking, depression, unusual weight change, abnormal bleeding, enlarged lymph nodes, angioedema, and breast masses.   Past Medical History  Diagnosis Date  . Intention tremor   . Abdominal pain, epigastric   . Constipation   . PUD (peptic ulcer disease)   . Helicobacter pylori gastritis   . Constipation   . PVD (peripheral vascular disease)   . Lumbago   . Osteoarthritis of spine   . Dyspepsia   . Fibromyalgia   . Pneumonia     hx of years ago   . Chronic kidney disease   . GERD (gastroesophageal reflux  disease)     Past Surgical History  Procedure Date  . Lumbar laminectomy   . Lumbar fusion   . Tubal ligation   . Tonsillectomy   . Breast enhancement surgery   . Abdominal hysterectomy     Medications:  HOME MEDS: Prior to Admission medications   Medication Sig Start Date End Date Taking? Authorizing Provider  ALPRAZolam (XANAX) 0.25 MG tablet Take 0.25 mg by mouth 3 (three) times daily as needed. Anxiety  08/14/11  Yes Jacques Navy, MD  amitriptyline (ELAVIL) 50 MG tablet 50 mg at bedtime. TAKE 1 TABLET BY MOUTH EVERY NIGHT AT BEDTIME 10/29/11  Yes Jacques Navy, MD  aspirin 325 MG tablet Take 325 mg by mouth daily.   Yes Historical Provider, MD  calcium-vitamin D (OSCAL WITH D) 500-200 MG-UNIT per tablet Take 1 tablet by mouth daily.   Yes Historical Provider, MD  Multiple Vitamins-Minerals (MULTIVITAMIN WITH MINERALS) tablet Take 1 tablet by mouth daily.   Yes Historical Provider, MD  omeprazole (PRILOSEC) 20 MG capsule Take 2 capsules (40 mg total) by mouth daily. 08/21/11  Yes Jacques Navy, MD  polyethylene glycol (MIRALAX / GLYCOLAX) packet Take 17 g by mouth daily. 12/03/11  Yes Jacques Navy, MD  sucralfate (CARAFATE) 1 G tablet 1 g. Take as crushed tablet in water (suspension) before meals and bedtime 10/01/11  Yes Jacques Navy, MD  traMADol (ULTRAM) 50 MG tablet Take 100 mg by mouth every 6 (six) hours as needed. BACK PAIN   Yes Historical Provider, MD     Allergies:  Allergies  Allergen Reactions  . Morphine And Related Itching and Rash  Given in IV.    Social History:   reports that she has never smoked. She has never used smokeless tobacco. She reports that she does not drink alcohol or use illicit drugs.  Family History: Family History  Problem Relation Age of Onset  . Heart disease Mother   . Diabetes Father   . Heart disease Father   . Heart disease Brother   . Diabetes Daughter      Physical Exam: Filed Vitals:   01/09/12 2230  01/09/12 2245 01/09/12 2300 01/09/12 2341  BP: 122/56 138/67 142/64 162/75  Pulse: 61 63 62 63  Temp:    97.6 F (36.4 C)  TempSrc:    Oral  Resp:    18  Height:    5\' 2"  (1.575 m)  Weight:    59.7 kg (131 lb 9.8 oz)  SpO2: 99% 98% 99% 99%   Blood pressure 162/75, pulse 63, temperature 97.6 F (36.4 C), temperature source Oral, resp. rate 18, height 5\' 2"  (1.575 m), weight 59.7 kg (131 lb 9.8 oz), SpO2 99.00%.  GEN:  person lying in the stretcher in no acute distress; cooperative with exam. She is extremely anxious. PSYCH:  alert and oriented x4; does not appear anxious or depressed; affect is appropriate. HEENT: Mucous membranes pink and anicteric; PERRLA; EOM intact; no cervical lymphadenopathy nor thyromegaly or carotid bruit; no JVD; Breasts:: Not examined CHEST WALL: No tenderness CHEST: Normal respiration, clear to auscultation bilaterally HEART: Regular rate and rhythm; no murmurs rubs or gallops BACK: No kyphosis or scoliosis; no CVA tenderness ABDOMEN: Obese, soft, non-tender; no masses, no organomegaly, normal abdominal bowel sounds; no pannus; no intertriginous candida. Scar from recent surgery well-healed without any evidence of infection. Rectal Exam: Not done EXTREMITIES: No bone or joint deformity; age-appropriate arthropathy of the hands and knees; no edema; no ulcerations. Genitalia: not examined PULSES: 2+ and symmetric SKIN: Normal hydration no rash or ulceration CNS: Cranial nerves 2-12 grossly intact no focal lateralizing neurologic deficit   Labs & Imaging Results for orders placed during the hospital encounter of 01/09/12 (from the past 48 hour(s))  CBC     Status: Abnormal   Collection Time   01/09/12  7:30 PM      Component Value Range Comment   WBC 6.0  4.0 - 10.5 (K/uL)    RBC 3.85 (*) 3.87 - 5.11 (MIL/uL)    Hemoglobin 11.5 (*) 12.0 - 15.0 (g/dL)    HCT 96.0 (*) 45.4 - 46.0 (%)    MCV 88.6  78.0 - 100.0 (fL)    MCH 29.9  26.0 - 34.0 (pg)    MCHC  33.7  30.0 - 36.0 (g/dL)    RDW 09.8  11.9 - 14.7 (%)    Platelets 312  150 - 400 (K/uL)   DIFFERENTIAL     Status: Normal   Collection Time   01/09/12  7:30 PM      Component Value Range Comment   Neutrophils Relative 49  43 - 77 (%)    Neutro Abs 2.9  1.7 - 7.7 (K/uL)    Lymphocytes Relative 40  12 - 46 (%)    Lymphs Abs 2.4  0.7 - 4.0 (K/uL)    Monocytes Relative 9  3 - 12 (%)    Monocytes Absolute 0.5  0.1 - 1.0 (K/uL)    Eosinophils Relative 2  0 - 5 (%)    Eosinophils Absolute 0.1  0.0 - 0.7 (K/uL)    Basophils Relative 1  0 - 1 (%)    Basophils Absolute 0.0  0.0 - 0.1 (K/uL)   COMPREHENSIVE METABOLIC PANEL     Status: Abnormal   Collection Time   01/09/12  7:30 PM      Component Value Range Comment   Sodium 135  135 - 145 (mEq/L)    Potassium 3.8  3.5 - 5.1 (mEq/L)    Chloride 100  96 - 112 (mEq/L)    CO2 23  19 - 32 (mEq/L)    Glucose, Bld 95  70 - 99 (mg/dL)    BUN 13  6 - 23 (mg/dL)    Creatinine, Ser 1.61  0.50 - 1.10 (mg/dL)    Calcium 9.4  8.4 - 10.5 (mg/dL)    Total Protein 7.1  6.0 - 8.3 (g/dL)    Albumin 4.1  3.5 - 5.2 (g/dL)    AST 26  0 - 37 (U/L)    ALT 18  0 - 35 (U/L)    Alkaline Phosphatase 68  39 - 117 (U/L)    Total Bilirubin 0.2 (*) 0.3 - 1.2 (mg/dL)    GFR calc non Af Amer 90 (*) >90 (mL/min)    GFR calc Af Amer >90  >90 (mL/min)   TROPONIN I     Status: Normal   Collection Time   01/09/12  7:30 PM      Component Value Range Comment   Troponin I <0.30  <0.30 (ng/mL)    Ct Head Wo Contrast  01/09/2012  *RADIOLOGY REPORT*  Clinical Data: Chest tightness and headache.  CT HEAD WITHOUT CONTRAST  Technique:  Contiguous axial images were obtained from the base of the skull through the vertex without contrast.  Comparison: None.  Findings: There is no evidence for acute infarction, intracranial hemorrhage, mass lesion, hydrocephalus, or extra-axial fluid. There is no atrophy or white matter disease.  Skull is normal except for slight left frontotemporal  exostosis.  There is no sinus or mastoid disease.  IMPRESSION: Negative exam.  Original Report Authenticated By: Elsie Stain, M.D.   Dg Chest Port 1 View  01/09/2012  *RADIOLOGY REPORT*  Clinical Data: Chest tightness.  Shortness of breath.  PORTABLE CHEST - 1 VIEW  Comparison: 09/20/2011.  Findings: Interval borderline enlargement of the cardiac silhouette.  Interval mild prominence of the pulmonary vasculature and interstitial markings.  No airspace consolidation.  Mild thoracic spine degenerative changes.  IMPRESSION: Interval borderline cardiomegaly and minimal changes of acute congestive heart failure.  Original Report Authenticated By: Darrol Angel, M.D.      Assessment Present on Admission:  .Chest pain .HTN (hypertension), benign .Lumbago .GAD (generalized anxiety disorder)   PLAN:  Atypical chest pain in patient with slight increased risk for cardiac disease. Will admit her to telemetry, continue to cycle her cardiac markers. I will obtain an echo of her heart. She'll be given benzodiazepine as her previous medication for anxiety. I will continue her home medications but will add Lopressor 25 mg twice a day. She is otherwise stable, full code, and will be admitted to Dr. Illene Regulus service. I suspect that she had untreated hypertension which is the cause of her cardiomegaly.  She may benefit in the future getting an ACE inhibitor, but I will defer that to her primary care physician.   Other plans as per orders.   Glen Kesinger 01/10/2012, 12:28 AM

## 2012-01-10 NOTE — Progress Notes (Signed)
*  PRELIMINARY RESULTS* Echocardiogram 2D Echocardiogram has been performed.  [name] [facility] 01/10/2012, 10:46 AM

## 2012-01-10 NOTE — Care Management Note (Unsigned)
    Page 1 of 1   01/10/2012     4:38:32 PM   CARE MANAGEMENT NOTE 01/10/2012  Patient:  [name], [name]   Account Number:  [id number]  Date Initiated:  01/10/2012  Documentation initiated by:  Lanier Clam  Subjective/Objective Assessment:   ADMITTED W/CHEST PAIN     Action/Plan:   FROM HOME   Anticipated DC Date:  01/11/2012   Anticipated DC Plan:  HOME/SELF CARE      DC Planning Services  CM consult      Choice offered to / List presented to:             Status of service:  In process, will continue to follow Medicare Important Message given?   (If response is "NO", the following Medicare IM given date fields will be blank) Date Medicare IM given:   Date Additional Medicare IM given:    Discharge Disposition:    Per UR Regulation:  Reviewed for med. necessity/level of care/duration of stay  If discussed at Long Length of Stay Meetings, dates discussed:    Comments:  01/10/12 [facility] RN,BSN NCM [phone]

## 2012-01-11 MED ORDER — METOPROLOL SUCCINATE ER 50 MG PO TB24: 50.0000 mg | ORAL_TABLET | Freq: Every day | ORAL | Status: DC

## 2012-01-11 NOTE — Progress Notes (Signed)
Patient has done well: headache resolved, BP controlled on beta-blocker, cardiac enzymes normal, no RAS  For discharge to home.  Dictation # [id number]

## 2012-01-11 NOTE — Progress Notes (Signed)
Patient discharged home with husband, discharge instructions given and explained to patient and husband and they verbalized understanding. Denies pain or distress. Skin intact, no wound. Transported to the car via wheelchair, accompanied home by husband.

## 2012-01-12 NOTE — Discharge Summary (Signed)
[name], [name]                ACCOUNT NO.:  [id number]  MEDICAL RECORD NO.:  [id number]  LOCATION:  1412                         FACILITY:  [facility]  PHYSICIAN:  Kendra Gess. Mal Asher, MD  DATE OF BIRTH:  [birth year]  DATE OF ADMISSION:  01/09/2012 DATE OF DISCHARGE:  01/11/2012                              DISCHARGE SUMMARY   ADMITTING DIAGNOSES: 1. Hypertension. 2. Chest pain. 3. Headache.  DISCHARGE DIAGNOSES: 1. Hypertension, controlled. 2. Chest pain, ruled out for myocardial infarction. 3. Headache, resolved.  CONSULTANTS:  None.  PROCEDURES: 1. CT scan of the head without contrast on the day of admission, which     was normal with no evidence for acute infarction, intracranial     hemorrhage, mass, lesion, hydrocephalus, or extra-axial fluid.     There is no atrophy or white matter disease. 2. Chest x-ray performed on the day of admission which showed interval     borderline cardiomegaly and minimal changes of acute congestive     heart failure. 3. 2D echo that revealed an ejection fraction of 55% to 60%.  Wall     motion was normal.  There were no regional wall motion     abnormalities.  Doppler parameters are consistent with abnormal     left ventricular relaxation consistent with grade 1 diastolic     dysfunction.  Left atrium was mildly dilated. 4. Renal artery duplex ultrasound, preliminary technician report was     no evidence of renal artery stenosis.  HISTORY OF PRESENT ILLNESS:  [name] is a 71 year old woman who recently underwent a partial nephrectomy for benign mass.  She is also followed for general anxiety disorder, chronic low back pain, fibromyalgia, and GERD.  She has no history of coronary artery disease and has had borderline hypertension without medication.  For several days prior to admission, the patient was having a significant problem with headache.  She had her blood pressure checked by our registered nurse friend, was found to have a  systolic of 202, diastolic of 161. The patient had no nausea or vomiting.  No shortness of breath.  In the ER, she was given nitroglycerin, which gave improvement of her headache and chest tightness.  She continued to be hypertensive.  She was subsequently admitted to rule out for MI.  Please see the H and P and other epic records for past medical history, family history, and social history.  MEDICATIONS:  At admission; 1. Alprazolam 0.25 mg t.i.d. p.r.n. 2. Elavil 50 mg nightly. 3. Aspirin 325 mg daily. 4. Calcium with vitamin D daily. 5. Multivitamin daily. 6. Omeprazole 20 mg 2 capsules q.a.m. 7. MiraLax 17 g daily. 8. Carafate 1 g, crushed and in suspension before meals and bedtime. 9. Tramadol 100 mg q.6 h. p.r.n. back pain.  HOSPITAL COURSE: 1. Hypertension.  The patient was initially given Lopressor 25 mg     b.i.d. with good control of her blood pressure.  She was converted     on Jan 10, 2012, to Toprol-XL 50 mg once daily.  She continued to     have adequate blood pressure control with a maximum blood pressure     in the  last 24 hours of 152/90.  Final blood pressure reading today     was 125/74.  The patient has tolerated beta-blockers well.  Plan,     the patient is to be discharged home on Toprol-XL 50 mg daily. 2. Cardiac.  The patient had cardiac enzymes cycled x3 with CK-MBs at     2.1, 2.0, and 2.2.  Troponin-I was less than 0.30 x4.  CRP was 0.5.     The patient had no recurrent chest pain or chest discomfort.  On     telemetry, she maintained a normal sinus rhythm.  At this point,     the patient is ruled out for coronary artery disease and has very     little evidence of any atherosclerosis. 3. Headache.  The patient's headache resolved with no recurrence. 4. Renal.  The patient with recent partial nephrectomy for what turned     out to be a benign tumor.  Renal function was checked this     admission with a creatinine of 0.6, BUN of 12. 5. With the patient  having ruled out for MI with no evidence for renal     artery stenosis with her blood pressure being adequately     controlled, at this point, she is ready for discharge to home.  DISCHARGE PHYSICAL EXAMINATION:  VITAL SIGNS:  Temperature was 98.5, blood pressure 125/74, heart rate 55, respirations 18, O2 saturations 98% on room air. GENERAL APPEARANCE:  A well-nourished, well-developed woman, in no acute distress. HEENT:  Conjunctiva and sclerae were clear.  Pupils are equal, round, and reactive to light and accommodation. NECK:  Supple. CHEST:  The patient has slightly decreased breath sounds at both bases. No rales or wheezes were noted.  There is no increased work of breathing. CARDIOVASCULAR:  2+ radial pulse.  Her precordium was quiet.  She had a regular rate and rhythm. NEUROLOGIC:  The patient is awake and alert.  She is oriented to person, place, time, and context.  Cranial nerves II-XII were intact.  She moves all her extremities to command.  FINAL LABORATORY:  CBC from Jan 10, 2012, with a white count of 5800; hemoglobin is 11.4 g; platelet count 301,000.  Final chemistry from Jan 10, 2012, with a sodium of 134, potassium 3.5, chloride 100, CO2 of 25, BUN 12, creatinine 0.6.  DISPOSITION:  The patient is to be discharged to home.  She will be seen in the office for followup in 5 to 7 days.  The patient's condition at the time of discharge dictation is stable and improved.     Kendra Gess Jaidin Ugarte, MD     MEN/MEDQ  D:  01/11/2012  T:  01/12/2012  Job:  [id number]

## 2012-01-14 ENCOUNTER — Encounter: Payer: Self-pay | Admitting: Internal Medicine

## 2012-01-14 ENCOUNTER — Ambulatory Visit (INDEPENDENT_AMBULATORY_CARE_PROVIDER_SITE_OTHER): Payer: Medicare Other | Admitting: Internal Medicine

## 2012-01-14 VITALS — BP 132/80 | HR 64 | Temp 98.2°F | Resp 16 | Wt 134.0 lb

## 2012-01-14 DIAGNOSIS — I10 Essential (primary) hypertension: Secondary | ICD-10-CM

## 2012-01-16 NOTE — Progress Notes (Signed)
  Subjective:    Patient ID: [name], female    DOB: Oct 01, 1940, 71 y.o.   MRN: [id number]  HPI [name] was recently hospitalized for persistently elevated blood pressure and headache. Hospital D/C reviewed: she had normal CT brain, normal Renal U/S w/o RAS, negative cardiac enzymes and normal telemetry monitoring. She was started on beta-blocker therapy and did well. She was d/c home on Toprol XL 50 mg daily.  Since being home she has done well: no recurrent headache and her BP has been reasonably well controlled.  Past Medical History  Diagnosis Date  . Intention tremor   . Abdominal pain, epigastric   . Constipation   . PUD (peptic ulcer disease)   . Helicobacter pylori gastritis   . Constipation   . PVD (peripheral vascular disease)   . Lumbago   . Osteoarthritis of spine   . Dyspepsia   . Fibromyalgia   . Pneumonia     hx of years ago   . Chronic kidney disease   . GERD (gastroesophageal reflux disease)    Past Surgical History  Procedure Date  . Lumbar laminectomy   . Lumbar fusion   . Tubal ligation   . Tonsillectomy   . Breast enhancement surgery   . Abdominal hysterectomy    Family History  Problem Relation Age of Onset  . Heart disease Mother   . Diabetes Father   . Heart disease Father   . Heart disease Brother   . Diabetes Daughter    History   Social History  . Marital Status: Married    Spouse Name: N/A    Number of Children: N/A  . Years of Education: N/A   Occupational History  . 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 Smoking status: Never Smoker   . Smokeless tobacco: Never Used  . Alcohol Use: No     occasional galss of wine with dinner   . Drug Use: No  . Sexually Active:    Other Topics Concern  . Not on file   Social History Narrative   Married- second marriage '76.  Patient has never smoked.  Alcohol use-no.  Daily caffeine use  3/4 cups per day.  Patient gets  regular exercise.  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  Marriage is is better shape than previously.   .    Review of Systems System review is negative for any constitutional, cardiac, pulmonary, GI or neuro symptoms or complaints other than as described in the HPI.     Objective:   Physical Exam Filed Vitals:   01/14/12 1633  BP: 132/80  Pulse: 64  Temp: 98.2 F (36.8 C)  Resp: 16   BP Readings from Last 3 Encounters:  01/14/12 132/80  01/11/12 125/74  11/20/11 151/82   Gen'l- WNWD white woman in no distress Cor - regular rate and rhythm Pulm - normal respirations Neuro - A&O x 3, CN II-XII intact, normal cerebellar function.       Assessment & Plan:

## 2012-01-16 NOTE — Assessment & Plan Note (Signed)
BP Readings from Last 3 Encounters:  01/14/12 132/80  01/11/12 125/74  11/20/11 151/82   Uneventful hospitalization. No evidence of RAS. Good response to BB therapy.  Plan - Continue present medication - Toprol XL 50 mg once a day.

## 2012-01-30 ENCOUNTER — Ambulatory Visit (INDEPENDENT_AMBULATORY_CARE_PROVIDER_SITE_OTHER): Payer: Medicare Other | Admitting: Internal Medicine

## 2012-01-30 ENCOUNTER — Encounter: Payer: Self-pay | Admitting: Internal Medicine

## 2012-01-30 VITALS — BP 132/80 | HR 60 | Temp 97.6°F | Resp 16 | Ht 62.0 in | Wt 132.0 lb

## 2012-01-30 DIAGNOSIS — I10 Essential (primary) hypertension: Secondary | ICD-10-CM

## 2012-01-30 MED ORDER — FUROSEMIDE 20 MG PO TABS: 20.0000 mg | ORAL_TABLET | Freq: Every day | ORAL | Status: DC

## 2012-01-30 NOTE — Progress Notes (Signed)
Subjective:    Patient ID: [name], female    DOB: Aug 09, 1941, 71 y.o.   MRN: [id number]  HPI [name] presents for BP follow-up. She was recently hospitalized for HTN and was started on metoprolol. She has been monitoring her BP and it has been running in the 140's-150's. She has been asymptomatic.  Past Medical History  Diagnosis Date  . Intention tremor   . Abdominal pain, epigastric   . Constipation   . PUD (peptic ulcer disease)   . Helicobacter pylori gastritis   . Constipation   . PVD (peripheral vascular disease)   . Lumbago   . Osteoarthritis of spine   . Dyspepsia   . Fibromyalgia   . Pneumonia     hx of years ago   . Chronic kidney disease   . GERD (gastroesophageal reflux disease)    Past Surgical History  Procedure Date  . Lumbar laminectomy   . Lumbar fusion   . Tubal ligation   . Tonsillectomy   . Breast enhancement surgery   . Abdominal hysterectomy    Family History  Problem Relation Age of Onset  . Heart disease Mother   . Diabetes Father   . Heart disease Father   . Heart disease Brother   . Diabetes Daughter    History   Social History  . Marital Status: Married    Spouse Name: N/A    Number of Children: N/A  . Years of Education: N/A   Occupational History  . 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 Smoking status: Never Smoker   . Smokeless tobacco: Never Used  . Alcohol Use: No     occasional galss of wine with dinner   . Drug Use: No  . Sexually Active:    Other Topics Concern  . Not on file   Social History Narrative   Married- second marriage '76.  Patient has never smoked.  Alcohol use-no.  Daily caffeine use  3/4 cups per day.  Patient gets regular exercise.  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  Marriage is is better shape than previously.    Current Outpatient Prescriptions on File Prior to Visit  Medication Sig  Dispense Refill  . ALPRAZolam (XANAX) 0.25 MG tablet Take 0.25 mg by mouth 3 (three) times daily as needed. Anxiety       . amitriptyline (ELAVIL) 50 MG tablet 50 mg at bedtime. TAKE 1 TABLET BY MOUTH EVERY NIGHT AT BEDTIME      . aspirin 325 MG tablet Take 325 mg by mouth daily.      . calcium-vitamin D (OSCAL WITH D) 500-200 MG-UNIT per tablet Take 1 tablet by mouth daily.      . metoprolol succinate (TOPROL-XL) 50 MG 24 hr tablet Take 1 tablet (50 mg total) by mouth daily. Take with or immediately following a meal.  30 tablet  5  . Multiple Vitamins-Minerals (MULTIVITAMIN WITH MINERALS) tablet Take 1 tablet by mouth daily.      Marland Kitchen omeprazole (PRILOSEC) 20 MG capsule Take 2 capsules (40 mg total) by mouth daily.  60 capsule  5  . polyethylene glycol (MIRALAX / GLYCOLAX) packet Take 17 g by mouth daily.  14 each  2  . traMADol (ULTRAM) 50 MG tablet Take 100 mg by mouth every 6 (six) hours as needed. BACK PAIN      .  furosemide (LASIX) 20 MG tablet Take 1 tablet (20 mg total) by mouth daily.  30 tablet  11      Review of Systems System review is negative for any constitutional, cardiac, pulmonary, GI or neuro symptoms or complaints other than as described in the HPI.     Objective:   Physical Exam Filed Vitals:   01/30/12 0945  BP: 132/80  Pulse: 60  Temp: 97.6 F (36.4 C)  Resp: 16   Gen'l- WNWD white woman in no distress HEENT- C&S clear, PERRLA Cor- RRR Pulm - normal respirations Neuro - A&O x 3, normal gait and station       Assessment & Plan:

## 2012-01-30 NOTE — Patient Instructions (Addendum)
Blood pressure, not adequately controlled. Plan is to add a low dose diuretic - furosemide 20 mg - to augment the metoprolol. You may have some increased urinary frequency. Keep track of your blood pressure and let me know how it is going. It does take 7-10 days to see the full benefit of the diuretic.   Hypertension As your heart beats, it forces blood through your arteries. This force is your blood pressure. If the pressure is too high, it is called hypertension (HTN) or high blood pressure. HTN is dangerous because you may have it and not know it. High blood pressure may mean that your heart has to work harder to pump blood. Your arteries may be narrow or stiff. The extra work puts you at risk for heart disease, stroke, and other problems.   Blood pressure consists of two numbers, a higher number over a lower, 110/72, for example. It is stated as "110 over 72." The ideal is below 120 for the top number (systolic) and under 80 for the bottom (diastolic). Write down your blood pressure today. You should pay close attention to your blood pressure if you have certain conditions such as:  Heart failure.   Prior heart attack.   Diabetes   Chronic kidney disease.   Prior stroke.   Multiple risk factors for heart disease.  To see if you have HTN, your blood pressure should be measured while you are seated with your arm held at the level of the heart. It should be measured at least twice. A one-time elevated blood pressure reading (especially in the Emergency Department) does not mean that you need treatment. There may be conditions in which the blood pressure is different between your right and left arms. It is important to see your caregiver soon for a recheck. Most people have essential hypertension which means that there is not a specific cause. This type of high blood pressure may be lowered by changing lifestyle factors such as:  Stress.   Smoking.   Lack of exercise.   Excessive weight.     Drug/tobacco/alcohol use.   Eating less salt.  Most people do not have symptoms from high blood pressure until it has caused damage to the body. Effective treatment can often prevent, delay or reduce that damage. TREATMENT   When a cause has been identified, treatment for high blood pressure is directed at the cause. There are a large number of medications to treat HTN. These fall into several categories, and your caregiver will help you select the medicines that are best for you. Medications may have side effects. You should review side effects with your caregiver. If your blood pressure stays high after you have made lifestyle changes or started on medicines,    Your medication(s) may need to be changed.   Other problems may need to be addressed.   Be certain you understand your prescriptions, and know how and when to take your medicine.   Be sure to follow up with your caregiver within the time frame advised (usually within two weeks) to have your blood pressure rechecked and to review your medications.   If you are taking more than one medicine to lower your blood pressure, make sure you know how and at what times they should be taken. Taking two medicines at the same time can result in blood pressure that is too low.  SEEK IMMEDIATE MEDICAL CARE IF:  You develop a severe headache, blurred or changing vision, or confusion.   You  have unusual weakness or numbness, or a faint feeling.   You have severe chest or abdominal pain, vomiting, or breathing problems.  MAKE SURE YOU:    Understand these instructions.   Will watch your condition.   Will get help right away if you are not doing well or get worse.  Document Released: 07/29/2005 Document Revised: 07/18/2011 Document Reviewed: 03/18/2008 [facility] Patient Information 2012 [place], [place].

## 2012-02-01 NOTE — Assessment & Plan Note (Signed)
BP Readings from Last 3 Encounters:  01/30/12 132/80  01/14/12 132/80  01/11/12 125/74   Suboptimal control at home based on her recorded blood pressure readings.  Plan  Continue BB  Add diuretic - furosemide 20 mg daily  Potassium rich foods recommended: citrus, bananas, leafy green vegetables.  Report back home BP readings.

## 2012-02-11 ENCOUNTER — Other Ambulatory Visit: Payer: Self-pay | Admitting: *Deleted

## 2012-02-11 NOTE — Telephone Encounter (Signed)
Ok to refill x 5 

## 2012-02-11 NOTE — Telephone Encounter (Signed)
PATIENT REQUEST REFILL ON ALPRAZOLAM 0.25MG . PLEASE ADVISE

## 2012-02-12 MED ORDER — ALPRAZOLAM 0.25 MG PO TABS: 0.2500 mg | ORAL_TABLET | Freq: Three times a day (TID) | ORAL | Status: DC | PRN

## 2012-02-12 NOTE — Telephone Encounter (Signed)
Medication called to [facility]

## 2012-02-27 ENCOUNTER — Other Ambulatory Visit: Payer: Self-pay | Admitting: *Deleted

## 2012-02-27 MED ORDER — OMEPRAZOLE 20 MG PO CPDR: 40.0000 mg | DELAYED_RELEASE_CAPSULE | Freq: Every day | ORAL | Status: DC

## 2012-02-27 NOTE — Telephone Encounter (Signed)
Refill sent to [facility]   omeprazole

## 2012-03-25 ENCOUNTER — Other Ambulatory Visit: Payer: Self-pay | Admitting: *Deleted

## 2012-03-25 NOTE — Telephone Encounter (Signed)
1. Ok for refills prn  2. I would like her to make an appointment for routine follow-up

## 2012-03-25 NOTE — Telephone Encounter (Signed)
Patient requests  Refill on amitriptyline.  Last OV 01/30/2012

## 2012-03-26 MED ORDER — AMITRIPTYLINE HCL 50 MG PO TABS: 50.0000 mg | ORAL_TABLET | Freq: Every day | ORAL | Status: DC

## 2012-03-26 NOTE — Telephone Encounter (Signed)
Refill sent to pharmacy [facility]. Patient has appt.  On 05/17/2012

## 2012-04-08 ENCOUNTER — Ambulatory Visit (INDEPENDENT_AMBULATORY_CARE_PROVIDER_SITE_OTHER): Payer: Medicare Other | Admitting: Internal Medicine

## 2012-04-08 ENCOUNTER — Encounter: Payer: Self-pay | Admitting: Internal Medicine

## 2012-04-08 VITALS — BP 110/70 | HR 64 | Temp 97.6°F | Resp 16 | Wt 137.0 lb

## 2012-04-08 DIAGNOSIS — R413 Other amnesia: Secondary | ICD-10-CM

## 2012-04-08 DIAGNOSIS — I10 Essential (primary) hypertension: Secondary | ICD-10-CM

## 2012-04-08 MED ORDER — AMLODIPINE BESYLATE 2.5 MG PO TABS: 2.5000 mg | ORAL_TABLET | Freq: Every day | ORAL | Status: DC

## 2012-04-08 NOTE — Patient Instructions (Addendum)
Memory - you did just fine on the MMSE, in fact you did a little better than in April '12. No indication for medication.  Blood pressure - good today but your record reveals a need for a little better management. Plan  Continue toprol XL and furosemide  Add - amlodipine 2.5 mg (very low dose) once a day.  Continue to monitor you BP.   Anxiety - seems to be doing better. No change in medications.

## 2012-04-08 NOTE — Progress Notes (Signed)
  Subjective:    Patient ID: [name], female    DOB: Sep 30, 1940, 71 y.o.   MRN: [id number]  HPI Mrs. Jesus presents for follow -up of BP as well as follow up of anxiety/ memory.  PMH, FamHx and SocHx reviewed for any changes and relevance.  Current Outpatient Prescriptions on File Prior to Visit  Medication Sig Dispense Refill  . ALPRAZolam (XANAX) 0.25 MG tablet Take 1 tablet (0.25 mg total) by mouth 3 (three) times daily as needed. Anxiety  100 tablet  5  . amitriptyline (ELAVIL) 50 MG tablet Take 1 tablet (50 mg total) by mouth at bedtime. TAKE 1 TABLET BY MOUTH EVERY NIGHT AT BEDTIME  30 tablet  6  . aspirin 325 MG tablet Take 325 mg by mouth daily.      . calcium-vitamin D (OSCAL WITH D) 500-200 MG-UNIT per tablet Take 1 tablet by mouth daily.      . furosemide (LASIX) 20 MG tablet Take 1 tablet (20 mg total) by mouth daily.  30 tablet  11  . metoprolol succinate (TOPROL-XL) 50 MG 24 hr tablet Take 1 tablet (50 mg total) by mouth daily. Take with or immediately following a meal.  30 tablet  5  . Multiple Vitamins-Minerals (MULTIVITAMIN WITH MINERALS) tablet Take 1 tablet by mouth daily.      Marland Kitchen omeprazole (PRILOSEC) 20 MG capsule Take 2 capsules (40 mg total) by mouth daily.  60 capsule  5  . polyethylene glycol (MIRALAX / GLYCOLAX) packet Take 17 g by mouth daily.  14 each  2  . traMADol (ULTRAM) 50 MG tablet Take 100 mg by mouth every 6 (six) hours as needed. BACK PAIN      . amLODipine (NORVASC) 2.5 MG tablet Take 1 tablet (2.5 mg total) by mouth daily.  30 tablet  11       Review of Systems System review is negative for any constitutional, cardiac, pulmonary, GI or neuro symptoms or complaints other than as described in the HPI.     Objective:   Physical Exam Filed Vitals:   04/08/12 1015  BP: 110/70  Pulse: 64  Temp: 97.6 F (36.4 C)  Resp: 16   Gen'l WNWD white woman in no distress Cor- RRR PUlm - normal respirations Neuro - A&O , gross motor -  normal MMSE: 1. Day,date,year - ok 2. Content: president- ok Gov. -  Ok Current events - fair. 3. Number repitition: 5 fwd - ok    5 rev - 1 error, 4 -ok     World reversed - ok 4. 3 word recall - 3/3ok 5. Serial 7's -   Stopped at 3rd iteration   , nickles in $1.25- ok     Change making - slow, minor error 6. Naming objects -   Ok         4 legged creatures - ok 7. Parables:  [place] -  ok     Rolling stone -  8. Judgement:  Letter          Fire 9. Clock face exercise          Assessment & Plan:

## 2012-04-09 ENCOUNTER — Other Ambulatory Visit: Payer: Self-pay | Admitting: *Deleted

## 2012-04-09 MED ORDER — POLYETHYLENE GLYCOL 3350 17 G PO PACK: 17.0000 g | PACK | Freq: Every day | ORAL | Status: DC

## 2012-04-09 MED ORDER — TRAMADOL HCL 50 MG PO TABS: 100.0000 mg | ORAL_TABLET | Freq: Three times a day (TID) | ORAL | Status: DC | PRN

## 2012-04-09 NOTE — Assessment & Plan Note (Signed)
Patient reports no real change that she has noted. MMSE was performed and she did better than at last exam April '12  Plan - serial evaluatinon - next MMSE 6 months.

## 2012-04-09 NOTE — Assessment & Plan Note (Signed)
Current meds: furosemide, toprolol xl. She has record from home that had revealed suboptimal control but she is asymptomatic  Plan - add amlodipine 2.5 mg once a day  Continue home monitoring

## 2012-04-14 ENCOUNTER — Telehealth: Payer: Self-pay | Admitting: Internal Medicine

## 2012-04-14 MED ORDER — POLYETHYLENE GLYCOL 3350 17 GM/SCOOP PO POWD: 17.0000 g | Freq: Every day | ORAL | Status: DC

## 2012-04-14 NOTE — Telephone Encounter (Signed)
Caller: Natalya/Patient; Phone: [phone]; Reason for Call: Please call pt re her Miralax refill; this was refilled for "packets", which is much more expensive than in large bottle.

## 2012-04-16 ENCOUNTER — Ambulatory Visit (INDEPENDENT_AMBULATORY_CARE_PROVIDER_SITE_OTHER): Payer: Medicare Other | Admitting: Internal Medicine

## 2012-04-16 ENCOUNTER — Encounter: Payer: Self-pay | Admitting: Internal Medicine

## 2012-04-16 ENCOUNTER — Telehealth: Payer: Self-pay | Admitting: Internal Medicine

## 2012-04-16 VITALS — BP 112/70 | HR 62 | Temp 97.7°F | Resp 16 | Wt 138.0 lb

## 2012-04-16 DIAGNOSIS — W19XXXA Unspecified fall, initial encounter: Secondary | ICD-10-CM

## 2012-04-16 NOTE — Telephone Encounter (Signed)
Ok to work in but [employer] she will be done by 10:30

## 2012-04-16 NOTE — Telephone Encounter (Signed)
Pt had a fall yesterday and she is having pain in her hip and shoulder today, she has to be at work at 10:30 and would like to be worked in this morning at 9:30 to see Dr. Debby Bud, would you like to work her in this morning?

## 2012-04-18 NOTE — Progress Notes (Signed)
  Subjective:    Patient ID: [name], female    DOB: 30-Nov-1940, 71 y.o.   MRN: [id number]  HPI Patient left without being seen.  Review of Systems     Objective:   Physical Exam        Assessment & Plan:

## 2012-05-07 ENCOUNTER — Other Ambulatory Visit: Payer: Self-pay | Admitting: *Deleted

## 2012-05-07 MED ORDER — TRAMADOL HCL 50 MG PO TABS: 100.0000 mg | ORAL_TABLET | Freq: Three times a day (TID) | ORAL | Status: DC | PRN

## 2012-05-11 ENCOUNTER — Ambulatory Visit (INDEPENDENT_AMBULATORY_CARE_PROVIDER_SITE_OTHER): Payer: Medicare Other | Admitting: Internal Medicine

## 2012-05-11 ENCOUNTER — Encounter: Payer: Self-pay | Admitting: Internal Medicine

## 2012-05-11 VITALS — BP 104/62 | HR 64 | Temp 97.4°F | Resp 16 | Wt 137.0 lb

## 2012-05-11 DIAGNOSIS — I10 Essential (primary) hypertension: Secondary | ICD-10-CM

## 2012-05-11 DIAGNOSIS — Z23 Encounter for immunization: Secondary | ICD-10-CM | POA: Insufficient documentation

## 2012-05-11 NOTE — Assessment & Plan Note (Addendum)
Blood pressure - we have looked for renal artery stenosis - not a problem. 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  Plan - continue metoprolol XR 50 mg once a day; continue furosemide 20 mg once a day           Increase amlodipine (Norvasc) to 5 mg. Report back in 5-7 days. We can go to 10 mg once a day if needed.

## 2012-05-11 NOTE — Patient Instructions (Addendum)
Blood pressure - we have looked for renal artery stenosis - not a problem. 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  Plan - continue metoprolol XR 50 mg once a day; continue furosemide 20 mg once a day           Increase amlodipine (Norvasc) to 5 mg. Report back in 5-7 days. We can go to 10 mg once a day if needed.    Hypertension As your heart beats, it forces blood through your arteries. This force is your blood pressure. If the pressure is too high, it is called hypertension (HTN) or high blood pressure. HTN is dangerous because you may have it and not know it. High blood pressure may mean that your heart has to work harder to pump blood. Your arteries may be narrow or stiff. The extra work puts you at risk for heart disease, stroke, and other problems.   Blood pressure consists of two numbers, a higher number over a lower, 110/72, for example. It is stated as "110 over 72." The ideal is below 120 for the top number (systolic) and under 80 for the bottom (diastolic). Write down your blood pressure today. You should pay close attention to your blood pressure if you have certain conditions such as:  Heart failure.   Prior heart attack.   Diabetes   Chronic kidney disease.   Prior stroke.   Multiple risk factors for heart disease.  To see if you have HTN, your blood pressure should be measured while you are seated with your arm held at the level of the heart. It should be measured at least twice. A one-time elevated blood pressure reading (especially in the Emergency Department) does not mean that you need treatment. There may be conditions in which the blood pressure is different between your right and left arms. It is important to see your caregiver soon for a recheck. Most people have essential hypertension which means that there is not a specific cause. This type of high blood pressure may be lowered by changing lifestyle factors such as:  Stress.   Smoking.   Lack of  exercise.   Excessive weight.   Drug/tobacco/alcohol use.   Eating less salt.  Most people do not have symptoms from high blood pressure until it has caused damage to the body. Effective treatment can often prevent, delay or reduce that damage. TREATMENT   When a cause has been identified, treatment for high blood pressure is directed at the cause. There are a large number of medications to treat HTN. These fall into several categories, and your caregiver will help you select the medicines that are best for you. Medications may have side effects. You should review side effects with your caregiver. If your blood pressure stays high after you have made lifestyle changes or started on medicines,    Your medication(s) may need to be changed.   Other problems may need to be addressed.   Be certain you understand your prescriptions, and know how and when to take your medicine.   Be sure to follow up with your caregiver within the time frame advised (usually within two weeks) to have your blood pressure rechecked and to review your medications.   If you are taking more than one medicine to lower your blood pressure, make sure you know how and at what times they should be taken. Taking two medicines at the same time can result in blood pressure that is too low.  SEEK IMMEDIATE MEDICAL CARE  IF:  You develop a severe headache, blurred or changing vision, or confusion.   You have unusual weakness or numbness, or a faint feeling.   You have severe chest or abdominal pain, vomiting, or breathing problems.  MAKE SURE YOU:    Understand these instructions.   Will watch your condition.   Will get help right away if you are not doing well or get worse.  Document Released: 07/29/2005 Document Revised: 07/18/2011 Document Reviewed: 03/18/2008 [facility] Patient Information 2012 [place], [place].

## 2012-05-11 NOTE — Progress Notes (Signed)
Subjective:    Patient ID: [name], female    DOB: 1940-12-04, 71 y.o.   MRN: [id number]  HPI Ms. Kirchhoff presents for BP follow-up. Her home readings have been in the >150 range frequently. She has been asymptomatic.  She has recently be to Urology: normal CT renal (by patient report), normal Cmet, normal U/A  Past Medical History  Diagnosis Date  . Intention tremor   . Abdominal pain, epigastric   . Constipation   . PUD (peptic ulcer disease)   . Helicobacter pylori gastritis   . Constipation   . PVD (peripheral vascular disease)   . Lumbago   . Osteoarthritis of spine   . Dyspepsia   . Fibromyalgia   . Pneumonia     hx of years ago   . Chronic kidney disease   . GERD (gastroesophageal reflux disease)    Past Surgical History  Procedure Date  . Lumbar laminectomy   . Lumbar fusion   . Tubal ligation   . Tonsillectomy   . Breast enhancement surgery   . Abdominal hysterectomy   . Eye surgery     bilateral staged cataract extraction with IOL   Family History  Problem Relation Age of Onset  . Heart disease Mother   . Diabetes Father   . Heart disease Father   . Heart disease Brother   . Diabetes Daughter    History   Social History  . Marital Status: Married    Spouse Name: N/A    Number of Children: N/A  . Years of Education: N/A   Occupational History  . 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 Smoking status: Never Smoker   . Smokeless tobacco: Never Used  . Alcohol Use: No     occasional galss of wine with dinner   . Drug Use: No  . Sexually Active:    Other Topics Concern  . Not on file   Social History Narrative   Married- second marriage '76.  Patient has never smoked.  Alcohol use-no.  Daily caffeine use  3/4 cups per day.  Patient gets regular exercise.  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  Marriage is is better shape than previously.     Current Outpatient Prescriptions on File Prior to Visit  Medication Sig Dispense Refill  . ALPRAZolam (XANAX) 0.25 MG tablet Take 1 tablet (0.25 mg total) by mouth 3 (three) times daily as needed. Anxiety  100 tablet  5  . amitriptyline (ELAVIL) 50 MG tablet Take 1 tablet (50 mg total) by mouth at bedtime. TAKE 1 TABLET BY MOUTH EVERY NIGHT AT BEDTIME  30 tablet  6  . amLODipine (NORVASC) 2.5 MG tablet Take 1 tablet (2.5 mg total) by mouth daily.  30 tablet  11  . aspirin 325 MG tablet Take 325 mg by mouth daily.      . calcium-vitamin D (OSCAL WITH D) 500-200 MG-UNIT per tablet Take 1 tablet by mouth daily.      . furosemide (LASIX) 20 MG tablet Take 1 tablet (20 mg total) by mouth daily.  30 tablet  11  . metoprolol succinate (TOPROL-XL) 50 MG 24 hr tablet Take 1 tablet (50 mg total) by mouth daily. Take with or immediately following a meal.  30 tablet  5  . Multiple Vitamins-Minerals (MULTIVITAMIN WITH MINERALS) tablet Take 1 tablet by mouth daily.      Marland Kitchen  omeprazole (PRILOSEC) 20 MG capsule Take 2 capsules (40 mg total) by mouth daily.  60 capsule  5  . polyethylene glycol powder (GLYCOLAX/MIRALAX) powder Take 17 g by mouth daily.  255 g  2  . traMADol (ULTRAM) 50 MG tablet Take 2 tablets (100 mg total) by mouth every 8 (eight) hours as needed. BACK PAIN  180 tablet  3      Review of Systems System review is negative for any constitutional, cardiac, pulmonary, GI or neuro symptoms or complaints other than as described in the HPI.     Objective:   Physical Exam Filed Vitals:   05/11/12 0933  BP: 104/62  Pulse: 64  Temp: 97.4 F (36.3 C)  Resp: 16   BP Readings from Last 3 Encounters:  05/11/12 104/62  04/16/12 112/70  04/08/12 110/70   Gen';l- WNWD white woman in no distress HEENT - C&S clear Cor - RRR Pulm - normal respirations.       Assessment & Plan:

## 2012-05-25 ENCOUNTER — Encounter: Payer: Medicare Other | Admitting: Internal Medicine

## 2012-06-01 ENCOUNTER — Encounter: Payer: Self-pay | Admitting: Internal Medicine

## 2012-06-01 ENCOUNTER — Ambulatory Visit (INDEPENDENT_AMBULATORY_CARE_PROVIDER_SITE_OTHER): Payer: Medicare Other | Admitting: Internal Medicine

## 2012-06-01 VITALS — BP 108/70 | HR 68 | Temp 98.2°F | Resp 16 | Wt 136.0 lb

## 2012-06-01 DIAGNOSIS — T17308A Unspecified foreign body in larynx causing other injury, initial encounter: Secondary | ICD-10-CM

## 2012-06-01 DIAGNOSIS — T17900A Unspecified foreign body in respiratory tract, part unspecified causing asphyxiation, initial encounter: Secondary | ICD-10-CM

## 2012-06-01 MED ORDER — CLINDAMYCIN HCL 300 MG PO CAPS: 300.0000 mg | ORAL_CAPSULE | Freq: Four times a day (QID) | ORAL | Status: DC

## 2012-06-01 NOTE — Progress Notes (Signed)
  Subjective:    Patient ID: [name], female    DOB: Dec 10, 1940, 71 y.o.   MRN: [id number]  HPI Mrs. Zulauf had a problem with a pill that got stuck Saturday night. She was finally was able to dislodge the pill by eating a piece of bread but subsequently has odynophagia and developed a fever to 102 and is producing thick dark sputum and has a real hoarse voice.  PMH, FamHx and SocHx reviewed for any changes and relevance.  Current Outpatient Prescriptions on File Prior to Visit  Medication Sig Dispense Refill  . ALPRAZolam (XANAX) 0.25 MG tablet Take 1 tablet (0.25 mg total) by mouth 3 (three) times daily as needed. Anxiety  100 tablet  5  . amitriptyline (ELAVIL) 50 MG tablet Take 1 tablet (50 mg total) by mouth at bedtime. TAKE 1 TABLET BY MOUTH EVERY NIGHT AT BEDTIME  30 tablet  6  . aspirin 325 MG tablet Take 325 mg by mouth daily.      . calcium-vitamin D (OSCAL WITH D) 500-200 MG-UNIT per tablet Take 1 tablet by mouth daily.      . furosemide (LASIX) 20 MG tablet Take 1 tablet (20 mg total) by mouth daily.  30 tablet  11  . metoprolol succinate (TOPROL-XL) 50 MG 24 hr tablet Take 1 tablet (50 mg total) by mouth daily. Take with or immediately following a meal.  30 tablet  5  . Multiple Vitamins-Minerals (MULTIVITAMIN WITH MINERALS) tablet Take 1 tablet by mouth daily.      Marland Kitchen omeprazole (PRILOSEC) 20 MG capsule Take 2 capsules (40 mg total) by mouth daily.  60 capsule  5  . polyethylene glycol powder (GLYCOLAX/MIRALAX) powder Take 17 g by mouth daily.  255 g  2  . traMADol (ULTRAM) 50 MG tablet Take 2 tablets (100 mg total) by mouth every 8 (eight) hours as needed. BACK PAIN  180 tablet  3      Review of Systems System review is negative for any constitutional, cardiac, pulmonary, GI or neuro symptoms or complaints other than as described in the HPI.     Objective:   Physical Exam Filed Vitals:   06/01/12 1020  BP: 108/70  Pulse: 68  Temp: 98.2 F (36.8 C)  Resp: 16    Gen'l- WNWD white woman in no distress HEENT- C&S clear, very hoarse voice Cor- RRR Pulm - normal respirations, lungs are clear to A&P Neuro - A&O x 3     Assessment & Plan:  Probably  Aspiration event - high risk for developing aspiration pneumonia. Hoarseness is certainly related the pill event and should clear. If the hoarseness doesn't clear in a week we may need ENT to take a look.  Plan-  clindamycin 300 mg 4 times a day for 7 days  Take your time when you take your medication - do not take too many at once.

## 2012-06-01 NOTE — Patient Instructions (Addendum)
Probably  Aspiration event - high risk for developing aspiration pneumonia. Hoarseness is certainly related the pill event and should clear. If the hoarseness doesn't clear in a week we may need ENT to take a look.  Plan-  clindamycin 300 mg 4 times a day for 7 days  Take your time when you take your medication - do not take too many at once.   Aspiration Pneumonia Aspiration pneumonia is an infection in your lungs. It occurs when you breathe (aspirate) things into your lungs such as food, vomit, or liquid. When these things get into your lungs, swelling (inflammation) can occur. This can make it difficult for you to breath. Aspiration pneumonia is a serious condition and can be life threatening.   CAUSES   Aspiration pneumonia can have many causes. Some of the causes include:  Having a brain injury or disease:   Stroke.   Seizures.   Confusion (Dementia).   ALS (Amyotrophic Lateral Sclerosis, or Lou Gehrig's disease).   Parkinson's disease.   Other causes of aspiration pneumonia include:   Being under general anesthesia for procedures.   Being in a coma (unconscious). This unconscious state can be caused by medicine, illegal drugs, injury or disease. Being in a coma can decrease a person's cough (gag) reflex. Aspiration can occur when a person is not awake enough to cough if something goes into the lungs.   A narrowing of the esophagus (the tube that carries food to the stomach).   Having dental problems that makes it hard to swallow.   Drinking too much alcohol. If a person passes out and vomits, vomit can be swallowed into the lungs.   Taking certain medications. Tranquilizers and sedatives can sometimes decrease your swallowing or gag reflex.  SYMPTOMS    Coughing after swallowing food or liquids.   Breathing problems. These could include wheezing (a whistling sound) or shortness of breath.   Bluish skin. This can be caused by lack of oxygen.   Coughing up food or  mucus. The mucus might contain blood, pus or greenish material.   Fever.   Chest pain.   Fatigue (being more tired than usual).   Sweating more than usual.   Bad breath.  DIAGNOSIS   A physical examination and testing will be needed to see if aspiration pneumonia is present. This can include:  A review of the above symptoms.   Chest X-ray. This is a picture of the lungs.   Listening to your lungs with a stethoscope. Your healthcare provider will listen for:   Crackling sounds in the lungs.   Decreased breath sounds.   A rapid heartbeat.   Swallowing study. This test looks at how food is swallowed and whether it goes into your breathing tube (trachea) or food pipe (esophagus).   Sputum culture. Sputum (saliva and mucus) is collected from the lungs or bronchi (tubes that carry air to the lungs). It is then tested for bacteria.   Computed tomography. This is called a CT scan. It also can show lung damage.   Bronchoscopy. This test uses a flexible tube (bronchoscope) to see inside the lungs.  TREATMENT   Treatment will depend on how severe the aspiration pneumonia is and what led to it.  Some people may need to be treated in the hospital. Your breathing will be carefully monitored. Depending on how well you are breathing, you may:   Be able to breath on your own but need oxygen.   Need breathing support via a  breathing machine (ventilator).   Medication:   Antibiotics or anti-fungal drugs might be prescribed. The particular medicine will depend on what caused the infection.   Other drugs may be given to reduce fever and/or pain.   Other treatments or corrections may be needed. For example:   Following a recommended diet. This is especially important if the swallowing study was failed.   Revising medications.   Fixing dental problems.   Correcting breathing obstructions.   Treating stomach disorders.   Dealing with alcohol issues.   Having a feeding tube  placed in the stomach.  HOME CARE INSTRUCTIONS    Take any medicines that were prescribed. Follow the directions carefully.   Check with your caregiver before taking over-the-counter medications.   Rest as instructed by your caregiver.   Keep all follow-up appointments with your healthcare provider. This is important so the caregiver can make sure the pneumonia is gone.  SEEK MEDICAL CARE IF:   Any of these symptoms return:  Shortness of breath or difficulty breathing.   Wheezing.   Fever of more than 100.5 F (38.1 C) or as recommended by your caregiver.   Chest pain.  MAKE SURE YOU:    Understand these instructions.   Will watch your condition.   Will get help right away if you are not doing well or get worse.  Document Released: 05/26/2009 Document Revised: 10/21/2011 Document Reviewed: 05/26/2009 [facility] Patient Information 2013 [place], [place].

## 2012-06-08 ENCOUNTER — Encounter: Payer: Self-pay | Admitting: Internal Medicine

## 2012-06-09 ENCOUNTER — Other Ambulatory Visit: Payer: Self-pay | Admitting: *Deleted

## 2012-06-09 MED ORDER — AMLODIPINE BESYLATE 5 MG PO TABS: 5.0000 mg | ORAL_TABLET | Freq: Every day | ORAL | Status: DC

## 2012-06-10 NOTE — Telephone Encounter (Signed)
Medication for Rx for amlodipine dose sent to pharmacy . Patient request refill on alprazolam.

## 2012-06-11 ENCOUNTER — Telehealth: Payer: Self-pay | Admitting: *Deleted

## 2012-06-11 NOTE — Telephone Encounter (Signed)
Pharmacy at [facility] has patient Rx refill for alprazolam. Patient will be able to get on Nov. 3rd per pharmacy.

## 2012-06-17 ENCOUNTER — Other Ambulatory Visit (HOSPITAL_COMMUNITY): Payer: Self-pay | Admitting: Urology

## 2012-06-17 ENCOUNTER — Ambulatory Visit (HOSPITAL_COMMUNITY)
Admission: RE | Admit: 2012-06-17 | Discharge: 2012-06-17 | Disposition: A | Discharge status: Home / Self Care | Payer: Medicare Other | Source: Ambulatory Visit | Attending: Urology | Admitting: Urology

## 2012-06-17 DIAGNOSIS — C649 Malignant neoplasm of unspecified kidney, except renal pelvis: Secondary | ICD-10-CM

## 2012-07-06 ENCOUNTER — Other Ambulatory Visit: Payer: Self-pay | Admitting: *Deleted

## 2012-07-06 MED ORDER — METOPROLOL SUCCINATE ER 50 MG PO TB24: 50.0000 mg | ORAL_TABLET | Freq: Every day | ORAL | Status: DC

## 2012-07-06 NOTE — Telephone Encounter (Signed)
R'cd fax from [facility] for refill of Metoprolol.

## 2012-07-13 ENCOUNTER — Encounter: Payer: Medicare Other | Admitting: Internal Medicine

## 2012-07-22 ENCOUNTER — Ambulatory Visit (INDEPENDENT_AMBULATORY_CARE_PROVIDER_SITE_OTHER): Payer: Medicare Other | Admitting: Internal Medicine

## 2012-07-22 ENCOUNTER — Other Ambulatory Visit (INDEPENDENT_AMBULATORY_CARE_PROVIDER_SITE_OTHER): Payer: Medicare Other

## 2012-07-22 ENCOUNTER — Telehealth: Payer: Self-pay | Admitting: Internal Medicine

## 2012-07-22 ENCOUNTER — Encounter: Payer: Self-pay | Admitting: Internal Medicine

## 2012-07-22 VITALS — BP 126/80 | HR 61 | Temp 98.1°F | Ht 62.0 in | Wt 138.0 lb

## 2012-07-22 DIAGNOSIS — R609 Edema, unspecified: Secondary | ICD-10-CM

## 2012-07-22 DIAGNOSIS — R600 Localized edema: Secondary | ICD-10-CM

## 2012-07-22 LAB — BASIC METABOLIC PANEL
BUN: 13 mg/dL (ref 6–23)
CO2: 32 mEq/L (ref 19–32)
Calcium: 9.3 mg/dL (ref 8.4–10.5)
Chloride: 103 mEq/L (ref 96–112)
Creatinine, Ser: 0.7 mg/dL (ref 0.4–1.2)
GFR: 87.57 mL/min (ref >60.00)
Glucose, Bld: 83 mg/dL (ref 70–99)
Potassium: 4.4 mEq/L (ref 3.5–5.1)
Sodium: 140 mEq/L (ref 135–145)

## 2012-07-22 NOTE — Progress Notes (Signed)
Subjective:    Patient ID: [name], female    DOB: 1941/02/02, 71 y.o.   MRN: [id number]  HPI  Pt presents to the clinic today with c/o increased swelling in her legs and feet. This started yesterday evening. She does have a part time job and she was on her feet more yesterday than she normally is. It did resolve after she laid down last night. She does not wear compression hose or keep her feet elevated while at home. She does have a history of hypertensions that is treated with norvasc, toprol and lasix. She is concerned that she may have a problem with her kidney function. 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  Review of Systems      Past Medical History  Diagnosis Date  . Intention tremor   . Abdominal pain, epigastric   . Constipation   . PUD (peptic ulcer disease)   . Helicobacter pylori gastritis   . Constipation   . PVD (peripheral vascular disease)   . Lumbago   . Osteoarthritis of spine   . Dyspepsia   . Fibromyalgia   . Pneumonia     hx of years ago   . Chronic kidney disease   . GERD (gastroesophageal reflux disease)     Current Outpatient Prescriptions  Medication Sig Dispense Refill  . ALPRAZolam (XANAX) 0.25 MG tablet Take 1 tablet (0.25 mg total) by mouth 3 (three) times daily as needed. Anxiety  100 tablet  5  . amitriptyline (ELAVIL) 50 MG tablet Take 1 tablet (50 mg total) by mouth at bedtime. TAKE 1 TABLET BY MOUTH EVERY NIGHT AT BEDTIME  30 tablet  6  . amLODipine (NORVASC) 5 MG tablet Take 1 tablet (5 mg total) by mouth daily.  30 tablet  10  . aspirin 325 MG tablet Take 325 mg by mouth daily.      . calcium-vitamin D (OSCAL WITH D) 500-200 MG-UNIT per tablet Take 1 tablet by mouth daily.      . clindamycin (CLEOCIN) 300 MG capsule Take 1 capsule (300 mg total) by mouth 4 (four) times daily.  28 capsule  0  . furosemide (LASIX) 20 MG tablet Take 1 tablet (20 mg total) by mouth  daily.  30 tablet  11  . metoprolol succinate (TOPROL-XL) 50 MG 24 hr tablet Take 1 tablet (50 mg total) by mouth daily. Take with or immediately following a meal.  30 tablet  5  . Multiple Vitamins-Minerals (MULTIVITAMIN WITH MINERALS) tablet Take 1 tablet by mouth daily.      Marland Kitchen omeprazole (PRILOSEC) 20 MG capsule Take 2 capsules (40 mg total) by mouth daily.  60 capsule  5  . polyethylene glycol powder (GLYCOLAX/MIRALAX) powder Take 17 g by mouth daily.  255 g  2  . traMADol (ULTRAM) 50 MG tablet Take 2 tablets (100 mg total) by mouth every 8 (eight) hours as needed. BACK PAIN  180 tablet  3    Allergies  Allergen Reactions  . Morphine And Related Itching and Rash    Given in IV.    Family History  Problem Relation Age of Onset  . Heart disease Mother   . Diabetes Father   . Heart disease Father   . Heart disease Brother   . Diabetes Daughter     History   Social History  . Marital Status: Married    Spouse Name: N/A  Number of Children: N/A  . Years of Education: N/A   Occupational History  . 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 Smoking status: Never Smoker   . Smokeless tobacco: Never Used  . Alcohol Use: No     Comment: occasional galss of wine with dinner   . Drug Use: No  . Sexually Active:    Other Topics Concern  . Not on file   Social History Narrative   Married- second marriage '76.  Patient has never smoked.  Alcohol use-no.  Daily caffeine use  3/4 cups per day.  Patient gets regular exercise.  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  Marriage is is better shape than previously.     Constitutional: Denies fever, malaise, fatigue, headache or abrupt weight changes.  Respiratory: Denies difficulty breathing, shortness of breath, cough or sputum production.   Cardiovascular: Pt reports increased swelling in the legs and feet bilaterally. Denies chest pain,  chest tightness, palpitations.   Neurological: Denies dizziness, difficulty with memory, difficulty with speech or problems with balance and coordination.   No other specific complaints in a complete review of systems (except as listed in HPI above).  Objective:   Physical Exam   BP 126/80  Pulse 61  Temp 98.1 F (36.7 C) (Oral)  Ht 5\' 2"  (1.575 m)  Wt 138 lb (62.596 kg)  BMI 25.24 kg/m2  SpO2 97% Wt Readings from Last 3 Encounters:  07/22/12 138 lb (62.596 kg)  06/01/12 136 lb (61.689 kg)  05/11/12 137 lb (62.143 kg)    General: Appears her stated age, well developed, well nourished in NAD. Skin: Warm, dry and intact. No rashes, lesions or ulcerations noted..  Cardiovascular: Normal rate and rhythm. S1,S2 noted.  No murmur, rubs or gallops noted. No JVD or BLE edema. No carotid bruits noted. 1+ non pitting pretibial and ankle edema bilaterally. Pulmonary/Chest: Normal effort and positive vesicular breath sounds. No respiratory distress. No wheezes, rales or ronchi noted.     BMET    Component Value Date/Time   NA 134* 01/10/2012 0740   K 3.5 01/10/2012 0740   CL 100 01/10/2012 0740   CO2 25 01/10/2012 0740   GLUCOSE 118* 01/10/2012 0740   BUN 12 01/10/2012 0740   CREATININE 0.60 01/10/2012 0740   CALCIUM 9.2 01/10/2012 0740   GFRNONAA >90 01/10/2012 0740   GFRAA >90 01/10/2012 0740    Lipid Panel  No results found for this basename: chol, trig, hdl, cholhdl, vldl, ldlcalc    CBC    Component Value Date/Time   WBC 5.8 01/10/2012 0740   RBC 3.80* 01/10/2012 0740   HGB 11.4* 01/10/2012 0740   HCT 34.1* 01/10/2012 0740   PLT 301 01/10/2012 0740   MCV 89.7 01/10/2012 0740   MCH 30.0 01/10/2012 0740   MCHC 33.4 01/10/2012 0740   RDW 12.7 01/10/2012 0740   LYMPHSABS 2.4 01/09/2012 1930   MONOABS 0.5 01/09/2012 1930   EOSABS 0.1 01/09/2012 1930   BASOSABS 0.0 01/09/2012 1930    Hgb A1C Lab Results  Component Value Date   HGBA1C 5.8 05/04/2009        Assessment & Plan:    Peripheral Edema, chronic problem with additional workup required:  Wear compression hose and keep feet elevated while at home Monitor blood pressure daily at home Will check kidney function today If problem persist, may need to  increase lasix  RTC as needed or if symptoms persist

## 2012-07-22 NOTE — Patient Instructions (Signed)
Peripheral Edema  You have swelling in your legs (peripheral edema). This swelling is due to excess accumulation of salt and water in your body. Edema may be a sign of heart, kidney or liver disease, or a side effect of a medication. It may also be due to problems in the leg veins. Elevating your legs and using special support stockings may be very helpful, if the cause of the swelling is due to poor venous circulation. Avoid long periods of standing, whatever the cause.  Treatment of edema depends on identifying the cause. Chips, pretzels, pickles and other salty foods should be avoided. Restricting salt in your diet is almost always needed. Water pills (diuretics) are often used to remove the excess salt and water from your body via urine. These medicines prevent the kidney from reabsorbing sodium. This increases urine flow.  Diuretic treatment may also result in lowering of potassium levels in your body. Potassium supplements may be needed if you have to use diuretics daily. Daily weights can help you keep track of your progress in clearing your edema. You should call your caregiver for follow up care as recommended.  SEEK IMMEDIATE MEDICAL CARE IF:    You have increased swelling, pain, redness, or heat in your legs.   You develop shortness of breath, especially when lying down.   You develop chest or abdominal pain, weakness, or fainting.   You have a fever.  Document Released: 09/05/2004 Document Revised: 10/21/2011 Document Reviewed: 08/16/2009  [employer] Patient Information 2013 [employer].

## 2012-07-22 NOTE — Telephone Encounter (Signed)
Ash, Dr. Debby Bud will be back tomorrow. I will defer the refill to him. Thx! Rene Kocher

## 2012-07-22 NOTE — Telephone Encounter (Signed)
Caller: [name]/Patient; Phone: [phone]; Reason for Call: Patient calling about alprazolam refill.  States has been getting refills monthly, but her pharmacy states no refill has been authorized x 2 attempts.  States seen in office AM 07/22/12 but did not know refill had not been done, or she would have asked for rx at that time.  Info to office for provider review/Rx/callback.  May reach patient at [phone].  Krs/can

## 2012-07-23 MED ORDER — ALPRAZOLAM 0.25 MG PO TABS: 0.2500 mg | ORAL_TABLET | Freq: Three times a day (TID) | ORAL | Status: DC | PRN

## 2012-07-23 NOTE — Telephone Encounter (Signed)
Pt informed rx sent to pharmacy.

## 2012-07-23 NOTE — Telephone Encounter (Signed)
Rx called into [facility]. Left message for pt to callback office.

## 2012-07-23 NOTE — Telephone Encounter (Signed)
Ok for refill x 5 

## 2012-07-27 ENCOUNTER — Encounter: Payer: Self-pay | Admitting: Internal Medicine

## 2012-07-27 ENCOUNTER — Ambulatory Visit (INDEPENDENT_AMBULATORY_CARE_PROVIDER_SITE_OTHER): Payer: Medicare Other | Admitting: Internal Medicine

## 2012-07-27 VITALS — BP 122/68 | HR 65 | Temp 96.0°F | Resp 10 | Wt 138.1 lb

## 2012-07-27 DIAGNOSIS — I872 Venous insufficiency (chronic) (peripheral): Secondary | ICD-10-CM

## 2012-07-27 DIAGNOSIS — R413 Other amnesia: Secondary | ICD-10-CM

## 2012-07-27 NOTE — Patient Instructions (Addendum)
1. Venous insufficiency - a mechanical problem. In your case it is NOT a kidney or heart problem. Plan - working girl support hose if you will be on your feet a lot.  2. Cognitive issues - tasking, memory, etc. You report progressive changes. Last CT brain was in May '13 and was normal. Last memory testing was in August '13 and you did OK. Last full cognitive testing about a year ago is as you report ( I will be getting that report). I share your concern about these changes. Plan Start Namenda XR - starter pak has clear instructions. Call me in the 3rd week - if tolerating OK will send in Rx  Referral to Neurology to rule out treatable and/or reversible central nervous system disease. This may take several weeks to schedule.    Venous Stasis and Chronic Venous Insufficiency As people age, the veins located in their legs may weaken and stretch. When veins weaken and lose the ability to pump blood effectively, the condition is called chronic venous insufficiency (CVI) or venous stasis. Almost all veins return blood back to the heart. This happens by:  The force of the heart pumping fresh blood pushes blood back to the heart.   Blood flowing to the heart from the force of gravity.  In the deep veins of the legs, blood has to fight gravity and flow upstream back to the heart. Here, the leg muscles contract to pump blood back toward the heart. Vein walls are elastic, and many veins have small valves that only allow blood to flow in one direction. When leg muscles contract, they push inward against the elastic vein walls. This squeezes blood upward, opens the valves, and moves blood toward the heart. When leg muscles relax, the vein wall also relaxes and the valves inside the vein close to prevent blood from flowing backward. This method of pumping blood out of the legs is called the venous pump. CAUSES   The venous pump works best while walking and leg muscles are contracting. But when a person sits or  stands, blood pressure in leg veins can build. Deep veins are usually able to withstand short periods of inactivity, but long periods of inactivity (and increased pressure) can stretch, weaken, and damage vein walls. High blood pressure can also stretch and damage vein walls. The veins may no longer be able to pump blood back to the heart. Venous hypertension (high blood pressure inside veins) that lasts over time is a primary cause of CVI. CVI can also be caused by:    Deep vein thrombosis, a condition where a thrombus (blood clot) blocks blood flow in a vein.   Phlebitis, an inflammation of a superficial vein that causes a blood clot to form.  Other risk factors for CVI may include:    Heredity.   Obesity.   Pregnancy.   Sedentary lifestyle.   Smoking.   Jobs requiring long periods of standing or sitting in one place.   Age and gender:   Women in their 64's and 40's and men in their 56's are more prone to developing CVI.  SYMPTOMS   Symptoms of CVI may include:    Varicose veins.   Ulceration or skin breakdown.   Lipodermatosclerosis, a condition that affects the skin just above the ankle, usually on the inside surface.  Over time the skin becomes brown, smooth, tight and often painful. Those with this condition have a high risk of developing skin ulcers.   Reddened or discolored skin  on the leg.   Swelling.  DIAGNOSIS  Your caregiver can diagnose CVI after performing a careful medical history and physical examination. To confirm the diagnosis, the following tests may also be ordered:    Duplex ultrasound.   Plethysmography (tests blood flow).   Venograms (x-ray using a special dye).  TREATMENT The goals of treatment for CVI are to restore a person to an active life and to minimize pain or disability. Typically, CVI does not pose a serious threat to life or limb, and with proper treatment most people with this condition can continue to lead active lives. In most cases,  mild CVI can be treated on an outpatient basis with simple procedures. Treatment methods include:    Elastic compression socks.   Sclerotherapy, a procedure involving an injection of a material that "dissolves" the damaged veins. Other veins in the network of blood vessels take over the function of the damaged veins.   Vein stripping (an older procedure less commonly used).   Laser Ablation surgery.   Valve repair.  HOME CARE INSTRUCTIONS    Elastic compression socks must be worn every day. They can help with symptoms and lower the chances of the problem getting worse, but they do not cure the problem.   Only take over-the-counter or prescription medicines for pain, discomfort, or fever as directed by your caregiver.   Your caregiver will review your other medications with you.  SEEK MEDICAL CARE IF:    You are confused about how to take your medications.   There is redness, swelling, or increasing pain in the affected area.   There is a red streak or line that extends up or down from the affected area.   There is a breakdown or loss of skin in the affected area, even if the breakdown is small.   You develop an unexplained oral temperature above 102 F (38.9 C).   There is an injury to the affected area.  SEEK IMMEDIATE MEDICAL CARE IF:    There is an injury and open wound to the affected area.   Pain is not adequately relieved with pain medication prescribed or becomes severe.   An oral temperature above 102 F (38.9 C) develops.   The foot/ankle below the affected area becomes suddenly numb or the area feels weak and hard to move.  MAKE SURE YOU:    Understand these instructions.   Will watch your condition.   Will get help right away if you are not doing well or get worse.  Document Released: 12/02/2006 Document Revised: 10/21/2011 Document Reviewed: 02/09/2007 [facility] Patient Information 2013 [place], [place].

## 2012-07-28 DIAGNOSIS — I872 Venous insufficiency (chronic) (peripheral): Secondary | ICD-10-CM | POA: Insufficient documentation

## 2012-07-28 NOTE — Assessment & Plan Note (Signed)
[name] is very concerned about progressive symptoms: her husband reports to her that she repeats questions and has difficulty retaining information; she has been very forgetful about normal household activities, e.g forgetting to do the laundry for a week!; she has had problems with balance; she has had problems with driving - back into trash cans which is very unusual. She declince repeat MMSE today. Her symptoms are concerning for dementia (frontal lob vs Lewey body) vs CNS abnormality.  Plan Refer for neurology evaluation.

## 2012-07-28 NOTE — Progress Notes (Signed)
Subjective:    Patient ID: [name], female    DOB: May 03, 1941, 71 y.o.   MRN: [id number]  HPI [name] presents for follow up of peripheral edema. She has recently been seen by [name] - at that visit she had renal function check with normal results. She reports that the edema is intermittent, usually resolves over night.  She is very concerned about mental function decline with perseveration, decreased ability to multi-task or complete simple tasks, balance disorder and coordination issues affecting her driving. She has had serial MMSE that are only mildly abnormal and stable. She has had full cognitive testing at [facility].  Past Medical History  Diagnosis Date  . Intention tremor   . Abdominal pain, epigastric   . Constipation   . PUD (peptic ulcer disease)   . Helicobacter pylori gastritis   . Constipation   . PVD (peripheral vascular disease)   . Lumbago   . Osteoarthritis of spine   . Dyspepsia   . Fibromyalgia   . Pneumonia     hx of years ago   . Chronic kidney disease   . GERD (gastroesophageal reflux disease)    Past Surgical History  Procedure Date  . Lumbar laminectomy   . Lumbar fusion   . Tubal ligation   . Tonsillectomy   . Breast enhancement surgery   . Abdominal hysterectomy   . Eye surgery     bilateral staged cataract extraction with IOL   Family History  Problem Relation Age of Onset  . Heart disease Mother   . Diabetes Father   . Heart disease Father   . Heart disease Brother   . Diabetes Daughter    History   Social History  . Marital Status: Married    Spouse Name: N/A    Number of Children: N/A  . Years of Education: N/A   Occupational History  . 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 Smoking status: Never Smoker   . Smokeless tobacco: Never Used  . Alcohol Use: No     Comment: occasional galss of wine with dinner   . Drug Use: No  . Sexually Active:    Other Topics  Concern  . Not on file   Social History Narrative   Married- second marriage '76.  Patient has never smoked.  Alcohol use-no.  Daily caffeine use  3/4 cups per day.  Patient gets regular exercise.  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  Marriage is is better shape than previously.    Current Outpatient Prescriptions on File Prior to Visit  Medication Sig Dispense Refill  . ALPRAZolam (XANAX) 0.25 MG tablet Take 1 tablet (0.25 mg total) by mouth 3 (three) times daily as needed. Anxiety  100 tablet  5  . amitriptyline (ELAVIL) 50 MG tablet Take 1 tablet (50 mg total) by mouth at bedtime. TAKE 1 TABLET BY MOUTH EVERY NIGHT AT BEDTIME  30 tablet  6  . amLODipine (NORVASC) 5 MG tablet Take 1 tablet (5 mg total) by mouth daily.  30 tablet  10  . aspirin 325 MG tablet Take 325 mg by mouth daily.      . calcium-vitamin D (OSCAL WITH D) 500-200 MG-UNIT per tablet Take 1 tablet by mouth daily.      . furosemide (LASIX) 20 MG tablet Take 1 tablet (20 mg total) by mouth daily.  30 tablet  11  . metoprolol succinate (TOPROL-XL) 50 MG 24 hr tablet Take 1 tablet (50 mg total) by mouth daily. Take with or immediately following a meal.  30 tablet  5  . Multiple Vitamins-Minerals (MULTIVITAMIN WITH MINERALS) tablet Take 1 tablet by mouth daily.      Marland Kitchen omeprazole (PRILOSEC) 20 MG capsule Take 2 capsules (40 mg total) by mouth daily.  60 capsule  5  . polyethylene glycol powder (GLYCOLAX/MIRALAX) powder Take 17 g by mouth daily.  255 g  2  . traMADol (ULTRAM) 50 MG tablet Take 2 tablets (100 mg total) by mouth every 8 (eight) hours as needed. BACK PAIN  180 tablet  3      Review of Systems System review is negative for any constitutional, cardiac, pulmonary, GI or neuro symptoms or complaints other than as described in the HPI.     Objective:   Physical Exam Filed Vitals:   07/27/12 1103  BP: 122/68  Pulse: 65  Temp: 96 F (35.6 C)  Resp: 10   Gen'l- WNWD  well groomed white woman who is mildly emotional about her concerns HEENT_ C&S clear Cor- RRR, no peripheral edema Pulm - normal respirations Neuro - A&O x 3, formal mental status testing deferred by patient.      Assessment & Plan:

## 2012-07-28 NOTE — Assessment & Plan Note (Signed)
Intermittent LE edema. Normal renal function and no evidence of heart disease/failure  Plan Patient provided education: cartoon and handout  Support hose as needed.

## 2012-08-10 ENCOUNTER — Ambulatory Visit (INDEPENDENT_AMBULATORY_CARE_PROVIDER_SITE_OTHER): Payer: Medicare Other | Admitting: Internal Medicine

## 2012-08-10 ENCOUNTER — Encounter: Payer: Self-pay | Admitting: Internal Medicine

## 2012-08-10 VITALS — BP 118/68 | HR 66 | Temp 97.6°F | Resp 12 | Wt 138.0 lb

## 2012-08-10 DIAGNOSIS — R413 Other amnesia: Secondary | ICD-10-CM

## 2012-08-10 DIAGNOSIS — N63 Unspecified lump in unspecified breast: Secondary | ICD-10-CM

## 2012-08-10 NOTE — Patient Instructions (Addendum)
1. Breast lump - normal exam but implants make for a sub-optimal exam. Plan - will refer to [facility] for diagnostic studies ASAP  2. Swelling in the feet/ankles and hands: reviewed 2 D echo - normal heart function with no evidence of damage; Renal ultra-sound reveals normal renal circulation and lab work confirms normal renal function. Cause of swelling is most likely venous insufficiency. Exercise helps.  3. Neurologic evaluation up-coming will be similar too previous exams but in more detail. The neurologist has a full copy of our records and the cognitive evaluation done at [facility].

## 2012-08-10 NOTE — Progress Notes (Signed)
  Subjective:    Patient ID: [name], female    DOB: Aug 09, 1941, 71 y.o.   MRN: [id number]  HPI Mrs. Narvaiz presents for evaluation of a breast lump she has found in the left breast. She does have a h/o implants.  She is also concerned about continued peripheral edema that is variable.   Lastly, she has concerns about up-coming neuro exam for cognitive change.  PMH, FamHx and SocHx reviewed for any changes and relevance. Current Outpatient Prescriptions on File Prior to Visit  Medication Sig Dispense Refill  . ALPRAZolam (XANAX) 0.25 MG tablet Take 1 tablet (0.25 mg total) by mouth 3 (three) times daily as needed. Anxiety  100 tablet  5  . amitriptyline (ELAVIL) 50 MG tablet Take 1 tablet (50 mg total) by mouth at bedtime. TAKE 1 TABLET BY MOUTH EVERY NIGHT AT BEDTIME  30 tablet  6  . amLODipine (NORVASC) 5 MG tablet Take 1 tablet (5 mg total) by mouth daily.  30 tablet  10  . aspirin 325 MG tablet Take 325 mg by mouth daily.      . calcium-vitamin D (OSCAL WITH D) 500-200 MG-UNIT per tablet Take 1 tablet by mouth daily.      . furosemide (LASIX) 20 MG tablet Take 1 tablet (20 mg total) by mouth daily.  30 tablet  11  . metoprolol succinate (TOPROL-XL) 50 MG 24 hr tablet Take 1 tablet (50 mg total) by mouth daily. Take with or immediately following a meal.  30 tablet  5  . Multiple Vitamins-Minerals (MULTIVITAMIN WITH MINERALS) tablet Take 1 tablet by mouth daily.      Marland Kitchen omeprazole (PRILOSEC) 20 MG capsule Take 2 capsules (40 mg total) by mouth daily.  60 capsule  5  . polyethylene glycol powder (GLYCOLAX/MIRALAX) powder Take 17 g by mouth daily.  255 g  2  . traMADol (ULTRAM) 50 MG tablet Take 2 tablets (100 mg total) by mouth every 8 (eight) hours as needed. BACK PAIN  180 tablet  3      Review of Systems .mnrsob     Objective:   Physical Exam Filed Vitals:   08/10/12 0926  BP: 118/68  Pulse: 66  Temp: 97.6 F (36.4 C)  Resp: 12   Gen'l- WNWD white woman Breast  exam - with hands on hips and hands raised overhead no dimpling, tugging or teathering noticed. Manual exam left breast - fixed firm implant, no palpable mass or lesion Ext - no pedal edema Neuro - A&O x 3, no focal deficits. Speech and reasoning seem clear and normal.       Assessment & Plan:  Breast lump - self discovered lump left breast at 0700 position. Exam is unrevealing but limited by implants.  Plan - refer for diagnostic mammography.

## 2012-08-12 DIAGNOSIS — C801 Malignant (primary) neoplasm, unspecified: Secondary | ICD-10-CM

## 2012-08-12 HISTORY — DX: Malignant (primary) neoplasm, unspecified: C80.1

## 2012-08-12 HISTORY — PX: CATARACT EXTRACTION: SUR2

## 2012-08-12 NOTE — Assessment & Plan Note (Signed)
No change in condition. She does have an appointment with neurology for a comprehensive exam. Cognitive testing information from [facility] and previous MMSE notes are available. She is reassured about up coming evaluation.

## 2012-08-19 ENCOUNTER — Telehealth: Payer: Self-pay | Admitting: Internal Medicine

## 2012-08-19 NOTE — Telephone Encounter (Signed)
Pt is calling for 2 reasons: 1) pt was seen in the office last on 08/10/12 and pt was to be referred to diagnostic mammography.  Pt has not heard from anyone regarding the test date and RN could not find any referral order in EPIC.  OFFICE PLEASE FOLLOW UP WITH REFERRAL FOR MAMMOGRAM FOR PT (PT FEELS LUMP AND THERE IS SORENESS IN LEFT BREAST)  2) Pt was started Namenda for memory.  Pt states she has been taking 21mg  for the past 3 days.  Pt states the medication is making her feel shakey, she cant walk in a straight line and she feel that when she sits down she falls immediately to sleep.  Pt is going to stop taking the medication because it makes her feel so bad. (pt is worried to drive her car while on the medication).  Pt does have an appt with [facility] on 09/01/12; and she will talk to them about the medication.

## 2012-08-19 NOTE — Telephone Encounter (Signed)
Appointment scheduled at [facility] 08/20/2012 9:45am. Pt aware per [facility] note.

## 2012-08-19 NOTE — Telephone Encounter (Signed)
Order entered 08/10/12. I don't know why the patient hasn't been notified/schedule

## 2012-09-01 ENCOUNTER — Ambulatory Visit (INDEPENDENT_AMBULATORY_CARE_PROVIDER_SITE_OTHER): Payer: Medicare Other | Admitting: Internal Medicine

## 2012-09-01 ENCOUNTER — Encounter: Payer: Self-pay | Admitting: Internal Medicine

## 2012-09-01 VITALS — BP 118/72 | HR 60 | Temp 97.9°F | Resp 10 | Wt 138.0 lb

## 2012-09-01 DIAGNOSIS — J069 Acute upper respiratory infection, unspecified: Secondary | ICD-10-CM

## 2012-09-01 NOTE — Progress Notes (Signed)
  Subjective:    Patient ID: [name], female    DOB: [birth year], 72 y.o.   MRN: [id number]  HPI [name] presents for a 24 hour h/o of myalgias, sore throat, chills w/o rigors, headache. She has not had any fever and is afebrile today. No N/V/D. No SOB no cough.  PMH, FamHx and SocHx reviewed for any changes and relevance. Current Outpatient Prescriptions on File Prior to Visit  Medication Sig Dispense Refill  . ALPRAZolam (XANAX) 0.25 MG tablet Take 1 tablet (0.25 mg total) by mouth 3 (three) times daily as needed. Anxiety  100 tablet  5  . amitriptyline (ELAVIL) 50 MG tablet Take 1 tablet (50 mg total) by mouth at bedtime. TAKE 1 TABLET BY MOUTH EVERY NIGHT AT BEDTIME  30 tablet  6  . amLODipine (NORVASC) 5 MG tablet Take 1 tablet (5 mg total) by mouth daily.  30 tablet  10  . aspirin 325 MG tablet Take 325 mg by mouth daily.      . calcium-vitamin D (OSCAL WITH D) 500-200 MG-UNIT per tablet Take 1 tablet by mouth daily.      . furosemide (LASIX) 20 MG tablet Take 1 tablet (20 mg total) by mouth daily.  30 tablet  11  . metoprolol succinate (TOPROL-XL) 50 MG 24 hr tablet Take 1 tablet (50 mg total) by mouth daily. Take with or immediately following a meal.  30 tablet  5  . Multiple Vitamins-Minerals (MULTIVITAMIN WITH MINERALS) tablet Take 1 tablet by mouth daily.      Marland Kitchen omeprazole (PRILOSEC) 20 MG capsule Take 2 capsules (40 mg total) by mouth daily.  60 capsule  5  . polyethylene glycol powder (GLYCOLAX/MIRALAX) powder Take 17 g by mouth daily.  255 g  2  . traMADol (ULTRAM) 50 MG tablet Take 2 tablets (100 mg total) by mouth every 8 (eight) hours as needed. BACK PAIN  180 tablet  3      Review of Systems System review is negative for any constitutional, cardiac, pulmonary, GI or neuro symptoms or complaints other than as described in the HPI.     Objective:   Physical Exam Filed Vitals:   09/01/12 1644  BP: 118/72  Pulse: 60  Temp: 97.9 F (36.6 C)  Resp: 10    Gen'l - WNWD white woman in no distress HEENT- TMs clear, throat w/o erythema or exudate, no sinus tenderness Cor - RRR Pulm - CTAP Nodes - negative Neuro - non-focal       Assessment & Plan:  Viral URI  Plan - supportive care.

## 2012-09-01 NOTE — Patient Instructions (Addendum)
Viral Upper respiratory infection with no signs of bacterial infection, thus no need for antibiotics.  Plan  Tylenol 500-1,000 mg three times a day on schedule  Hydrate  Vitamin C  Ecchinacea   Rest and keep away from the vulnerable.   Upper Respiratory Infection, Adult An upper respiratory infection (URI) is also sometimes known as the common cold. The upper respiratory tract includes the nose, sinuses, throat, trachea, and bronchi. Bronchi are the airways leading to the lungs. Most people improve within 1 week, but symptoms can last up to 2 weeks. A residual cough may last even longer.   CAUSES Many different viruses can infect the tissues lining the upper respiratory tract. The tissues become irritated and inflamed and often become very moist. Mucus production is also common. A cold is contagious. You can easily spread the virus to others by oral contact. This includes kissing, sharing a glass, coughing, or sneezing. Touching your mouth or nose and then touching a surface, which is then touched by another person, can also spread the virus. SYMPTOMS   Symptoms typically develop 1 to 3 days after you come in contact with a cold virus. Symptoms vary from person to person. They may include:  Runny nose.   Sneezing.   Nasal congestion.   Sinus irritation.   Sore throat.   Loss of voice (laryngitis).   Cough.   Fatigue.   Muscle aches.   Loss of appetite.   Headache.   Low-grade fever.  DIAGNOSIS   You might diagnose your own cold based on familiar symptoms, since most people get a cold 2 to 3 times a year. Your caregiver can confirm this based on your exam. Most importantly, your caregiver can check that your symptoms are not due to another disease such as strep throat, sinusitis, pneumonia, asthma, or epiglottitis. Blood tests, throat tests, and X-rays are not necessary to diagnose a common cold, but they may sometimes be helpful in excluding other more serious diseases. Your  caregiver will decide if any further tests are required. RISKS AND COMPLICATIONS   You may be at risk for a more severe case of the common cold if you smoke cigarettes, have chronic heart disease (such as heart failure) or lung disease (such as asthma), or if you have a weakened immune system. The very young and very old are also at risk for more serious infections. Bacterial sinusitis, middle ear infections, and bacterial pneumonia can complicate the common cold. The common cold can worsen asthma and chronic obstructive pulmonary disease (COPD). Sometimes, these complications can require emergency medical care and may be life-threatening. PREVENTION   The best way to protect against getting a cold is to practice good hygiene. Avoid oral or hand contact with people with cold symptoms. Wash your hands often if contact occurs. There is no clear evidence that vitamin C, vitamin E, echinacea, or exercise reduces the chance of developing a cold. However, it is always recommended to get plenty of rest and practice good nutrition. TREATMENT   Treatment is directed at relieving symptoms. There is no cure. Antibiotics are not effective, because the infection is caused by a virus, not by bacteria. Treatment may include:  Increased fluid intake. Sports drinks offer valuable electrolytes, sugars, and fluids.   Breathing heated mist or steam (vaporizer or shower).   Eating chicken soup or other clear broths, and maintaining good nutrition.   Getting plenty of rest.   Using gargles or lozenges for comfort.   Controlling fevers with ibuprofen  or acetaminophen as directed by your caregiver.   Increasing usage of your inhaler if you have asthma.  Zinc gel and zinc lozenges, taken in the first 24 hours of the common cold, can shorten the duration and lessen the severity of symptoms. Pain medicines may help with fever, muscle aches, and throat pain. A variety of non-prescription medicines are available to treat  congestion and runny nose. Your caregiver can make recommendations and may suggest nasal or lung inhalers for other symptoms.   HOME CARE INSTRUCTIONS    Only take over-the-counter or prescription medicines for pain, discomfort, or fever as directed by your caregiver.   Use a warm mist humidifier or inhale steam from a shower to increase air moisture. This may keep secretions moist and make it easier to breathe.   Drink enough water and fluids to keep your urine clear or pale yellow.   Rest as needed.   Return to work when your temperature has returned to normal or as your caregiver advises. You may need to stay home longer to avoid infecting others. You can also use a face mask and careful hand washing to prevent spread of the virus.  SEEK MEDICAL CARE IF:    After the first few days, you feel you are getting worse rather than better.   You need your caregiver's advice about medicines to control symptoms.   You develop chills, worsening shortness of breath, or brown or red sputum. These may be signs of pneumonia.   You develop yellow or brown nasal discharge or pain in the face, especially when you bend forward. These may be signs of sinusitis.   You develop a fever, swollen neck glands, pain with swallowing, or white areas in the back of your throat. These may be signs of strep throat.  SEEK IMMEDIATE MEDICAL CARE IF:    You have a fever.   You develop severe or persistent headache, ear pain, sinus pain, or chest pain.   You develop wheezing, a prolonged cough, cough up blood, or have a change in your usual mucus (if you have chronic lung disease).   You develop sore muscles or a stiff neck.  Document Released: 01/22/2001 Document Revised: 10/21/2011 Document Reviewed: 11/30/2010 [facility] Patient Information 2013 [place], [place].

## 2012-09-07 ENCOUNTER — Other Ambulatory Visit: Payer: Self-pay | Admitting: *Deleted

## 2012-09-07 MED ORDER — OMEPRAZOLE 20 MG PO CPDR: 40.0000 mg | DELAYED_RELEASE_CAPSULE | Freq: Every day | ORAL | Status: DC

## 2012-09-07 MED ORDER — TRAMADOL HCL 50 MG PO TABS: 100.0000 mg | ORAL_TABLET | Freq: Three times a day (TID) | ORAL | Status: DC | PRN

## 2012-09-08 ENCOUNTER — Encounter: Payer: Self-pay | Admitting: Internal Medicine

## 2012-09-14 ENCOUNTER — Encounter: Payer: Medicare Other | Admitting: Internal Medicine

## 2012-09-14 ENCOUNTER — Other Ambulatory Visit: Payer: Self-pay | Admitting: *Deleted

## 2012-09-14 MED ORDER — POLYETHYLENE GLYCOL 3350 17 GM/SCOOP PO POWD: 17.0000 g | Freq: Every day | ORAL | Status: DC

## 2012-10-01 ENCOUNTER — Encounter: Payer: Self-pay | Admitting: Internal Medicine

## 2012-10-05 ENCOUNTER — Telehealth: Payer: Self-pay | Admitting: Internal Medicine

## 2012-10-05 NOTE — Telephone Encounter (Signed)
Patient Information:  Caller Name: [name]  Phone: [phone]  Patient: [name], [name]  Gender: Female  DOB: March 13, 1941  Age: 72 Years  PCP: Illene Regulus (Adults only)   Does the office need to follow up with this patient?: No    Reason For Call & Symptoms: sinus symptoms: clear nasal discharge, eye pain , afebrile, calling for appointment  Reviewed Health History In EMR: Yes  Reviewed Medications In EMR: Yes  Reviewed Allergies In EMR: Yes  Reviewed Surgeries / Procedures: Yes  Date of Onset of Symptoms: 09/30/2012  Guideline(s) Used:  No Protocol Available - Sick Adult  Disposition Per Guideline:  See Today or Tomorrow in Office  Reason For Disposition Reached:  Nursing judgment  Advice Given:  Call Back If:  You become worse.  Appointment Scheduled:  10/07/2012 11:15:00 Scheduled Provider:  Illene Regulus (Adults only)

## 2012-10-07 ENCOUNTER — Ambulatory Visit: Payer: Self-pay | Admitting: Internal Medicine

## 2012-10-19 ENCOUNTER — Other Ambulatory Visit: Payer: Self-pay | Admitting: *Deleted

## 2012-10-19 MED ORDER — AMITRIPTYLINE HCL 50 MG PO TABS: 50.0000 mg | ORAL_TABLET | Freq: Every day | ORAL | Status: DC

## 2012-11-02 ENCOUNTER — Encounter: Payer: Self-pay | Admitting: Internal Medicine

## 2012-11-03 ENCOUNTER — Encounter: Payer: Medicare Other | Admitting: Internal Medicine

## 2012-11-30 ENCOUNTER — Encounter: Payer: Medicare Other | Admitting: Internal Medicine

## 2012-12-28 ENCOUNTER — Other Ambulatory Visit (HOSPITAL_COMMUNITY): Payer: Self-pay | Admitting: Urology

## 2012-12-28 ENCOUNTER — Ambulatory Visit (HOSPITAL_COMMUNITY)
Admission: RE | Admit: 2012-12-28 | Discharge: 2012-12-28 | Disposition: A | Discharge status: Home / Self Care | Payer: Medicare Other | Source: Ambulatory Visit | Attending: Urology | Admitting: Urology

## 2012-12-28 DIAGNOSIS — C649 Malignant neoplasm of unspecified kidney, except renal pelvis: Secondary | ICD-10-CM

## 2012-12-28 DIAGNOSIS — Z905 Acquired absence of kidney: Secondary | ICD-10-CM | POA: Insufficient documentation

## 2013-01-05 ENCOUNTER — Other Ambulatory Visit: Payer: Self-pay

## 2013-01-06 MED ORDER — TRAMADOL HCL 50 MG PO TABS: 100.0000 mg | ORAL_TABLET | Freq: Three times a day (TID) | ORAL | Status: DC | PRN

## 2013-01-11 ENCOUNTER — Other Ambulatory Visit: Payer: Self-pay | Admitting: Internal Medicine

## 2013-01-24 ENCOUNTER — Other Ambulatory Visit: Payer: Self-pay | Admitting: Internal Medicine

## 2013-01-25 ENCOUNTER — Telehealth: Payer: Self-pay

## 2013-01-25 NOTE — Telephone Encounter (Signed)
Phone call from patient's husband, Ed. He states his wife has an appt with you tomorrow morning and it is very important that you call him before her appt. Call him at [phone]

## 2013-01-25 NOTE — Telephone Encounter (Signed)
Alprazolam called to pharmacy  

## 2013-01-26 ENCOUNTER — Ambulatory Visit (INDEPENDENT_AMBULATORY_CARE_PROVIDER_SITE_OTHER): Payer: Medicare Other | Admitting: Internal Medicine

## 2013-01-26 ENCOUNTER — Encounter: Payer: Self-pay | Admitting: Internal Medicine

## 2013-01-26 ENCOUNTER — Other Ambulatory Visit (INDEPENDENT_AMBULATORY_CARE_PROVIDER_SITE_OTHER): Payer: Medicare Other

## 2013-01-26 VITALS — BP 122/84 | HR 72 | Temp 97.0°F | Resp 12 | Ht 62.0 in | Wt 136.0 lb

## 2013-01-26 DIAGNOSIS — Z23 Encounter for immunization: Secondary | ICD-10-CM

## 2013-01-26 DIAGNOSIS — I10 Essential (primary) hypertension: Secondary | ICD-10-CM

## 2013-01-26 DIAGNOSIS — F411 Generalized anxiety disorder: Secondary | ICD-10-CM

## 2013-01-26 DIAGNOSIS — K279 Peptic ulcer, site unspecified, unspecified as acute or chronic, without hemorrhage or perforation: Secondary | ICD-10-CM

## 2013-01-26 DIAGNOSIS — R413 Other amnesia: Secondary | ICD-10-CM

## 2013-01-26 DIAGNOSIS — G250 Essential tremor: Secondary | ICD-10-CM

## 2013-01-26 DIAGNOSIS — IMO0001 Reserved for inherently not codable concepts without codable children: Secondary | ICD-10-CM

## 2013-01-26 DIAGNOSIS — G252 Other specified forms of tremor: Secondary | ICD-10-CM

## 2013-01-26 DIAGNOSIS — Z0000 Encounter for general adult medical examination without abnormal findings: Secondary | ICD-10-CM

## 2013-01-26 LAB — CBC WITH DIFFERENTIAL/PLATELET
Basophils Absolute: 0 10*3/uL (ref 0.0–0.1)
Basophils Relative: 0.6 % (ref 0.0–3.0)
Eosinophils Absolute: 0.1 10*3/uL (ref 0.0–0.7)
Eosinophils Relative: 1.8 % (ref 0.0–5.0)
HCT: 39.1 % (ref 36.0–46.0)
Hemoglobin: 13.1 g/dL (ref 12.0–15.0)
Lymphocytes Relative: 29.1 % (ref 12.0–46.0)
Lymphs Abs: 2.2 10*3/uL (ref 0.7–4.0)
MCHC: 33.6 g/dL (ref 30.0–36.0)
MCV: 91.8 fl (ref 78.0–100.0)
Monocytes Absolute: 0.4 10*3/uL (ref 0.1–1.0)
Monocytes Relative: 5.4 % (ref 3.0–12.0)
Neutro Abs: 4.9 10*3/uL (ref 1.4–7.7)
Neutrophils Relative %: 63.1 % (ref 43.0–77.0)
Platelets: 320 10*3/uL (ref 150.0–400.0)
RBC: 4.26 Mil/uL (ref 3.87–5.11)
RDW: 13.4 % (ref 11.5–14.6)
WBC: 7.7 10*3/uL (ref 4.5–10.5)

## 2013-01-26 LAB — COMPREHENSIVE METABOLIC PANEL
ALT: 16 U/L (ref 0–35)
AST: 25 U/L (ref 0–37)
Albumin: 4.8 g/dL (ref 3.5–5.2)
Alkaline Phosphatase: 65 U/L (ref 39–117)
BUN: 13 mg/dL (ref 6–23)
CO2: 26 mEq/L (ref 19–32)
Calcium: 10 mg/dL (ref 8.4–10.5)
Chloride: 102 mEq/L (ref 96–112)
Creatinine, Ser: 0.7 mg/dL (ref 0.4–1.2)
GFR: 83.31 mL/min (ref >60.00)
Glucose, Bld: 88 mg/dL (ref 70–99)
Potassium: 4 mEq/L (ref 3.5–5.1)
Sodium: 139 mEq/L (ref 135–145)
Total Bilirubin: 0.4 mg/dL (ref 0.3–1.2)
Total Protein: 8.1 g/dL (ref 6.0–8.3)

## 2013-01-26 LAB — VITAMIN B12: Vitamin B-12: 702 pg/mL (ref 211–911)

## 2013-01-26 LAB — TSH: TSH: 3.31 u[IU]/mL (ref 0.35–5.50)

## 2013-01-26 LAB — T4, FREE: Free T4: 0.96 ng/dL (ref 0.60–1.60)

## 2013-01-26 NOTE — Progress Notes (Signed)
Subjective:    Patient ID: [name], female    DOB: February 19, 1941, 72 y.o.   MRN: [id number]  HPI Mrs. Milbourn is here for annual Medicare wellness examination and management of other chronic and acute problems.  Her main problem is her memory and cognitive impairment, diagnosed by neurology. Reviewed Dr. Richrd Humbles note - he recommended recheck B12, Thyroid.  Anxiety and depression - she has been on xanax on a regular basis for some time: two at bedtime.   She continues to deal with chronic pain: back pain and arthritic pain. She has been taking Elavil for years for fibromyalgia and pain management. She is also taking xanax. For arthritic pain she did get relief from mobic but this aggrevated.    The risk factors are reflected in the social history.  The roster of all physicians providing medical care to patient - is listed in the Snapshot section of the chart.  Activities of daily living:  The patient is 100% inedpendent in all ADLs: dressing, toileting, feeding as well as independent mobility  Home safety : The patient has smoke detectors in the home. Falls - none. Home is reasonably fall safe. They wear seatbelts. No firearms at home. There is no violence in the home.   There is no risks for hepatitis, STDs or HIV. There is no history of blood transfusion. They have no travel history to infectious disease endemic areas of the world.  The patient has not seen their dentist in the last six month. They have seen their eye doctor in the last year. They deny any hearing difficulty and have not had audiologic testing in the last year.    They do not  have excessive sun exposure. Discussed the need for sun protection: hats, long sleeves and use of sunscreen if there is significant sun exposure.   Diet: the importance of a healthy diet is discussed. They do have a healthy diet.  The patient has a regular exercise program: walks a mile , 30 duration, 3-4 per week.  The benefits of regular  aerobic exercise were discussed.  Depression screen: there are signs or vegative symptoms of depression- irritability,   Cognitive assessment: the patient manages all their financial and personal affairs and is actively engaged. They could relate day,date,year and events; recalled 3/3 objects at 3 minutes; performed clock-face test normally.  The following portions of the patient's history were reviewed and updated as appropriate: allergies, current medications, past family history, past medical history,  past surgical history, past social history  and problem list.  Vision, hearing, body mass index were assessed and reviewed.   During the course of the visit the patient was educated and counseled about appropriate screening and preventive services including : fall prevention , diabetes screening, nutrition counseling, colorectal cancer screening, and recommended immunizations.  Past Medical History  Diagnosis Date  . Intention tremor   . Abdominal pain, epigastric   . Constipation   . PUD (peptic ulcer disease)   . Helicobacter pylori gastritis   . Constipation   . PVD (peripheral vascular disease)   . Lumbago   . Osteoarthritis of spine   . Dyspepsia   . Fibromyalgia   . Pneumonia     hx of years ago   . Chronic kidney disease   . GERD (gastroesophageal reflux disease)    Past Surgical History  Procedure Laterality Date  . Lumbar laminectomy    . Lumbar fusion    . Tubal ligation    .  Tonsillectomy    . Breast enhancement surgery    . Abdominal hysterectomy    . Eye surgery      bilateral staged cataract extraction with IOL   Family History  Problem Relation Age of Onset  . Heart disease Mother   . Diabetes Father   . Heart disease Father   . Heart disease Brother   . Diabetes Daughter    History   Social History  . Marital Status: Married    Spouse Name: N/A    Number of Children: N/A  . Years of Education: N/A   Occupational History  . retired- [employer]   . 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 Smoking status: Never Smoker   . Smokeless tobacco: Never Used  . Alcohol Use: No     Comment: occasional galss of wine with dinner   . Drug Use: No  . Sexually Active:    Other Topics Concern  . Not on file   Social History Narrative   Married- second marriage '76.  Patient has never smoked.  Alcohol use-no.  Daily caffeine use  3/4 cups per day.  Patient gets regular exercise.  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  Marriage is is better shape than previously.    Current Outpatient Prescriptions on File Prior to Visit  Medication Sig Dispense Refill  . ALPRAZolam (XANAX) 0.5 MG tablet TAKE 1 TABLET BY MOUTH THREE TIMES DAILY AS NEEDED  100 tablet  0  . amitriptyline (ELAVIL) 50 MG tablet Take 1 tablet (50 mg total) by mouth at bedtime.  30 tablet  6  . amLODipine (NORVASC) 5 MG tablet Take 1 tablet (5 mg total) by mouth daily.  30 tablet  10  . aspirin 325 MG tablet Take 325 mg by mouth daily.      . calcium-vitamin D (OSCAL WITH D) 500-200 MG-UNIT per tablet Take 1 tablet by mouth daily.      . furosemide (LASIX) 20 MG tablet Take 1 tablet (20 mg total) by mouth daily.  30 tablet  11  . metoprolol succinate (TOPROL-XL) 50 MG 24 hr tablet Take 1 tablet (50 mg total) by mouth daily. Take with or immediately following a meal.  30 tablet  5  . Multiple Vitamins-Minerals (MULTIVITAMIN WITH MINERALS) tablet Take 1 tablet by mouth daily.      Marland Kitchen omeprazole (PRILOSEC) 20 MG capsule Take 2 capsules (40 mg total) by mouth daily.  60 capsule  5  . polyethylene glycol powder (GLYCOLAX/MIRALAX) powder Take 17 g by mouth daily.  255 g  2  . traMADol (ULTRAM) 50 MG tablet Take 2 tablets (100 mg total) by mouth every 8 (eight) hours as needed. BACK PAIN  180 tablet  3   No current facility-administered medications on file prior to visit.      Review of Systems Constitutional:   Negative for fever, chills, activity change and unexpected weight change.  HEENT:  Negative for hearing loss, ear pain, congestion, neck stiffness and postnasal drip. Negative for sore throat or swallowing problems. Negative for dental complaints.   Eyes: Negative for vision loss or change in visual acuity.  Respiratory: Negative for chest tightness and wheezing. Negative for DOE.   Cardiovascular: Negative for chest pain or palpitations. No decreased exercise tolerance Gastrointestinal: No change in bowel habit. No bloating or gas. No reflux or indigestion Genitourinary: Negative for urgency, frequency, flank  pain and difficulty urinating.  Musculoskeletal: Negative for myalgias, back pain, arthralgias and gait problem.  Neurological: Negative for dizziness, tremors, weakness and headaches.  Hematological: Negative for adenopathy.  Psychiatric/Behavioral: Negative for behavioral problems and dysphoric mood. Anxiety and cognitive decline      Objective:   Physical Exam Filed Vitals:   01/26/13 1003  BP: 122/84  Pulse: 72  Temp: 97 F (36.1 C)  Resp: 12   Wt Readings from Last 3 Encounters:  01/26/13 136 lb (61.689 kg)  09/01/12 138 lb 0.6 oz (62.615 kg)  08/10/12 138 lb 0.6 oz (62.615 kg)   Gen'l: well nourished, well developed white Woman in no distress HEENT - [place]/AT, EACs/TMs normal, oropharynx with native dentition in good condition, no buccal or palatal lesions, posterior pharynx clear, mucous membranes moist. C&S clear, PERRLA, fundi - normal Neck - supple, no thyromegaly Nodes- negative submental, cervical, supraclavicular regions Chest - no deformity, no CVAT Lungs - clear without rales, wheezes. No increased work of breathing Breast - deferred to recent exam Cardiovascular - regular rate and rhythm, quiet precordium, no murmurs, rubs or gallops, 2+ radial, DP and PT pulses Abdomen - BS+ x 4, no HSM, no guarding or rebound or tenderness Pelvic - deferred  Rectal -  deferred  Extremities - no clubbing, cyanosis, edema or deformity.  Neuro - A&O x 3, CN II-XII normal, motor strength normal and equal, DTRs 2+ and symmetrical biceps, radial, and patellar tendons. Cerebellar - no tremor, no rigidity, fluid movement and normal gait. Derm - Head, neck, back, abdomen and extremities without suspicious lesions  Recent Results (from the past 2160 hour(s))  TSH     Status: None   Collection Time    01/26/13 11:38 AM      Result Value Range   TSH 3.31  0.35 - 5.50 uIU/mL  COMPREHENSIVE METABOLIC PANEL     Status: None   Collection Time    01/26/13 11:38 AM      Result Value Range   Sodium 139  135 - 145 mEq/L   Potassium 4.0  3.5 - 5.1 mEq/L   Chloride 102  96 - 112 mEq/L   CO2 26  19 - 32 mEq/L   Glucose, Bld 88  70 - 99 mg/dL   BUN 13  6 - 23 mg/dL   Creatinine, Ser 0.7  0.4 - 1.2 mg/dL   Total Bilirubin 0.4  0.3 - 1.2 mg/dL   Alkaline Phosphatase 65  39 - 117 U/L   AST 25  0 - 37 U/L   ALT 16  0 - 35 U/L   Total Protein 8.1  6.0 - 8.3 g/dL   Albumin 4.8  3.5 - 5.2 g/dL   Calcium 40.9  8.4 - 81.1 mg/dL   GFR 91.47  >82.95 mL/min  CBC WITH DIFFERENTIAL     Status: None   Collection Time    01/26/13 11:38 AM      Result Value Range   WBC 7.7  4.5 - 10.5 K/uL   RBC 4.26  3.87 - 5.11 Mil/uL   Hemoglobin 13.1  12.0 - 15.0 g/dL   HCT 62.1  30.8 - 65.7 %   MCV 91.8  78.0 - 100.0 fl   MCHC 33.6  30.0 - 36.0 g/dL   RDW 84.6  96.2 - 95.2 %   Platelets 320.0  150.0 - 400.0 K/uL   Neutrophils Relative % 63.1  43.0 - 77.0 %   Lymphocytes Relative 29.1  12.0 -  46.0 %   Monocytes Relative 5.4  3.0 - 12.0 %   Eosinophils Relative 1.8  0.0 - 5.0 %   Basophils Relative 0.6  0.0 - 3.0 %   Neutro Abs 4.9  1.4 - 7.7 K/uL   Lymphs Abs 2.2  0.7 - 4.0 K/uL   Monocytes Absolute 0.4  0.1 - 1.0 K/uL   Eosinophils Absolute 0.1  0.0 - 0.7 K/uL   Basophils Absolute 0.0  0.0 - 0.1 K/uL  VITAMIN B12     Status: None   Collection Time    01/26/13 11:38 AM       Result Value Range   Vitamin B-12 702  211 - 911 pg/mL  T4, FREE     Status: None   Collection Time    01/26/13 11:38 AM      Result Value Range   Free T4 0.96  0.60 - 1.60 ng/dL        Assessment & Plan:

## 2013-01-26 NOTE — Patient Instructions (Addendum)
Thanks for coming to see me. I am sorry for [facility] passing. You were a dutiful and caring daughter-in-law  You exam today is normal  Cognitive impairment and memory - Dr. Liana Crocker did a good job and I trust him. We will check ,at his suggestion, B12 and thryoid function today. You should keep your follow up appointment with him in August.  Anxiety = or - depression: Xanax is not a good drug for you. Please taper off: dropping a 1/2 pill every three days until you are off it all together. If you have trouble sleeping we can come up with alternatives. For the anxiety I suggest we try Zoloft starting at 25 mg daily and after 4 weeks increasing to 50 mg.  Routine lab today and result will be on MyChart

## 2013-01-28 ENCOUNTER — Other Ambulatory Visit: Payer: Self-pay | Admitting: Internal Medicine

## 2013-01-28 DIAGNOSIS — Z0000 Encounter for general adult medical examination without abnormal findings: Secondary | ICD-10-CM | POA: Insufficient documentation

## 2013-01-28 NOTE — Assessment & Plan Note (Signed)
Chronic stable problem with adequate control of pain.

## 2013-01-28 NOTE — Assessment & Plan Note (Signed)
Interval hisotry w/o major illness or surgery. Chronic problems are stable. Limited physical exam is normal. Lab results reviewed. She is current with colorectal and breast cancer screening. Immunizations are brought up to date.  In summary - a nice woman who appears medically stable with some memory decline and suboptimally treated anxiety. She will return in 3 months for follow up.

## 2013-01-28 NOTE — Assessment & Plan Note (Signed)
Anxiety = or - depression: Xanax is not a good drug for older patients.   Plan -  taper off: dropping a 1/2 pill every three days until you are off it all together. If you have trouble sleeping we can come up with alternatives.    Zoloft starting at 25 mg daily and after 4 weeks increasing to 50 mg.

## 2013-01-28 NOTE — Assessment & Plan Note (Signed)
Mild progression in tremor. Not yet to the point where additional medications is needed or neuro consult.  Plan Close follow-up

## 2013-01-28 NOTE — Assessment & Plan Note (Signed)
Abbreviated MMSE: 2/3 word recall at 3 minutes; no additional testing done. She does appear to be having some progression of cognitive impairment.  Plan At next office visit - full MMSE  Consider medical therapy if symptoms continue to progress.

## 2013-01-28 NOTE — Assessment & Plan Note (Signed)
BP Readings from Last 3 Encounters:  01/26/13 122/84  09/01/12 118/72  08/10/12 118/68   Good control on present medications

## 2013-02-15 ENCOUNTER — Telehealth: Payer: Self-pay | Admitting: *Deleted

## 2013-02-15 DIAGNOSIS — K921 Melena: Secondary | ICD-10-CM

## 2013-02-15 NOTE — Telephone Encounter (Signed)
Needs to be an add on for tomorrow to be sure she is not having an active bleed. Needs to have stat H/H prior to visit. Order entered.

## 2013-02-15 NOTE — Telephone Encounter (Signed)
Pt called states she is having abd pain with dark sticky stools.  She further states she has a history of stomach ulcers.  She is requesting a referral to GI for an Endoscopy.  Please advise

## 2013-02-16 ENCOUNTER — Other Ambulatory Visit (INDEPENDENT_AMBULATORY_CARE_PROVIDER_SITE_OTHER): Payer: Medicare Other

## 2013-02-16 ENCOUNTER — Encounter: Payer: Self-pay | Admitting: Internal Medicine

## 2013-02-16 ENCOUNTER — Ambulatory Visit (INDEPENDENT_AMBULATORY_CARE_PROVIDER_SITE_OTHER): Payer: Medicare Other | Admitting: Internal Medicine

## 2013-02-16 VITALS — BP 120/64 | HR 65 | Temp 97.8°F | Resp 12 | Wt 137.0 lb

## 2013-02-16 DIAGNOSIS — K921 Melena: Secondary | ICD-10-CM

## 2013-02-16 DIAGNOSIS — K922 Gastrointestinal hemorrhage, unspecified: Secondary | ICD-10-CM

## 2013-02-16 DIAGNOSIS — R1013 Epigastric pain: Secondary | ICD-10-CM

## 2013-02-16 LAB — HEMOGLOBIN AND HEMATOCRIT, BLOOD
HCT: 34.4 % — ABNORMAL LOW (ref 36.0–46.0)
Hemoglobin: 11.6 g/dL — ABNORMAL LOW (ref 12.0–15.0)

## 2013-02-16 MED ORDER — OMEPRAZOLE 40 MG PO CPDR: 40.0000 mg | DELAYED_RELEASE_CAPSULE | Freq: Two times a day (BID) | ORAL | Status: DC

## 2013-02-16 MED ORDER — SUCRALFATE 1 G PO TABS: ORAL_TABLET | ORAL | Status: DC

## 2013-02-16 MED ORDER — AMITRIPTYLINE HCL 50 MG PO TABS: 50.0000 mg | ORAL_TABLET | Freq: Every day | ORAL | Status: DC

## 2013-02-16 MED ORDER — POLYETHYLENE GLYCOL 3350 17 GM/SCOOP PO POWD: 17.0000 g | Freq: Every day | ORAL | Status: DC

## 2013-02-16 NOTE — Telephone Encounter (Signed)
Spoke with pt, advised her she needs an appointment and transferred her to scheduling for appointment.  Also advised her to go to lab for H/H prior to appointment.

## 2013-02-16 NOTE — Progress Notes (Signed)
Subjective:    Patient ID: [name], female    DOB: [birth year], 72 y.o.   MRN: [id number]  HPI [name] reports that she developed severe epigastric pain and had black tarry stools. She has been taking omeprazole 20 mg twice a day and added carafate AC/HS. She has not had chest pain, SOB and has not had frank hematemesis or hematochezia. The black stools have improved.  Past Medical History  Diagnosis Date  . Intention tremor   . Abdominal pain, epigastric   . Constipation   . PUD (peptic ulcer disease)   . Helicobacter pylori gastritis   . Constipation   . PVD (peripheral vascular disease)   . Lumbago   . Osteoarthritis of spine   . Dyspepsia   . Fibromyalgia   . Pneumonia     hx of years ago   . Chronic kidney disease   . GERD (gastroesophageal reflux disease)    Past Surgical History  Procedure Laterality Date  . Lumbar laminectomy    . Lumbar fusion    . Tubal ligation    . Tonsillectomy    . Breast enhancement surgery    . Abdominal hysterectomy    . Eye surgery      bilateral staged cataract extraction with IOL   Family History  Problem Relation Age of Onset  . Heart disease Mother   . Diabetes Father   . Heart disease Father   . Heart disease Brother   . Diabetes Daughter    History   Social History  . Marital Status: Married    Spouse Name: N/A    Number of Children: N/A  . Years of Education: N/A   Occupational History  . 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 Smoking status: Never Smoker   . Smokeless tobacco: Never Used  . Alcohol Use: No     Comment: occasional galss of wine with dinner   . Drug Use: No  . Sexually Active:    Other Topics Concern  . Not on file   Social History Narrative   Married- second marriage '76.  Patient has never smoked.  Alcohol use-no.  Daily caffeine use  3/4 cups per day.  Patient gets regular exercise.  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  Marriage is is better shape than previously.    Current Outpatient Prescriptions on File Prior to Visit  Medication Sig Dispense Refill  . ALPRAZolam (XANAX) 0.5 MG tablet TAKE 1 TABLET BY MOUTH THREE TIMES DAILY AS NEEDED  100 tablet  0  . amitriptyline (ELAVIL) 50 MG tablet Take 1 tablet (50 mg total) by mouth at bedtime.  30 tablet  6  . amLODipine (NORVASC) 5 MG tablet Take 1 tablet (5 mg total) by mouth daily.  30 tablet  10  . aspirin 325 MG tablet Take 325 mg by mouth daily.      . calcium-vitamin D (OSCAL WITH D) 500-200 MG-UNIT per tablet Take 1 tablet by mouth daily.      . furosemide (LASIX) 20 MG tablet TAKE 1 TABLET BY MOUTH DAILY  30 tablet  5  . metoprolol succinate (TOPROL-XL) 50 MG 24 hr tablet Take 1 tablet (50 mg total) by mouth daily. Take with or immediately following a meal.  30 tablet  5  . Multiple Vitamins-Minerals (MULTIVITAMIN WITH MINERALS) tablet Take 1 tablet by mouth  daily.      . omeprazole (PRILOSEC) 20 MG capsule Take 2 capsules (40 mg total) by mouth daily.  60 capsule  5  . traMADol (ULTRAM) 50 MG tablet Take 2 tablets (100 mg total) by mouth every 8 (eight) hours as needed. BACK PAIN  180 tablet  3   No current facility-administered medications on file prior to visit.        Review of Systems System review is negative for any constitutional, cardiac, pulmonary, GI or neuro symptoms or complaints other than as described in the HPI.     Objective:   Physical Exam Filed Vitals:   02/16/13 1033  BP: 120/64  Pulse: 65  Temp: 97.8 F (36.6 C)  Resp: 12   Gen'l- WNWD white woman in no distress Abd - BS+, no guarding or rebound, very tender in the epigastrium Rectal - NST, no blood in the vault, stool brown-green. Stool was not heme positive.  Lab Hgb 12.3 in June to 11.6g today.       Assessment & Plan:

## 2013-02-16 NOTE — Patient Instructions (Addendum)
Probable upper GI bleed but today there is no sign of active bleeding. Stool was negative for blood. Hemoglobin has dropped from 13.1 to 11.6 grams.  Plan Increase omeprazole to 40 mg twice a day  Continue on carafate 1 g before meals and bedtime  Will request appointment with Dr. Marina Goodell for further evaluation.   Call for any signs of active bleeding: throwing up blood, frank blood per rectum, shortness of breath, unremitting pain.   Gastrointestinal Bleeding Gastrointestinal (GI) bleeding means there is bleeding somewhere along the digestive tract, between the mouth and anus. CAUSES  There are many different problems that can cause GI bleeding. Possible causes include:  Esophagitis. This is inflammation, irritation, or swelling of the esophagus.  Hemorrhoids.These are veins that are full of blood (engorged) in the rectum. They cause pain, inflammation, and may bleed.  Anal fissures.These are areas of painful tearing which may bleed. They are often caused by passing hard stool.  Diverticulosis.These are pouches that form on the colon over time, with age, and may bleed significantly.  Diverticulitis.This is inflammation in areas with diverticulosis. It can cause pain, fever, and bloody stools, although bleeding is rare.  Polyps and cancer. Colon cancer often starts out as precancerous polyps.  Gastritis and ulcers.Bleeding from the upper gastrointestinal tract (near the stomach) may travel through the intestines and produce black, sometimes tarry, often bad smelling stools. In certain cases, if the bleeding is fast enough, the stools may not be black, but red. This condition may be life-threatening. SYMPTOMS   Vomiting bright red blood or material that looks like coffee grounds.  Bloody, black, or tarry stools. DIAGNOSIS  Your caregiver may diagnose your condition by taking your history and performing a physical exam. More tests may be needed, including:  X-rays and other imaging  tests.  Esophagogastroduodenoscopy (EGD). This test uses a flexible, lighted tube to look at your esophagus, stomach, and small intestine.  Colonoscopy. This test uses a flexible, lighted tube to look at your colon. TREATMENT  Treatment depends on the cause of your bleeding.   For bleeding from the esophagus, stomach, small intestine, or colon, the caregiver doing your EGD or colonoscopy may be able to stop the bleeding as part of the procedure.  Inflammation or infection of the colon can be treated with medicines.  Many rectal problems can be treated with creams, suppositories, or warm baths.  Surgery is sometimes needed.  Blood transfusions are sometimes needed if you have lost a lot of blood. If bleeding is slow, you may be allowed to go home. If there is a lot of bleeding, you will need to stay in the hospital for observation. HOME CARE INSTRUCTIONS   Take any medicines exactly as prescribed.  Keep your stools soft by eating foods that are high in fiber. These foods include whole grains, legumes, fruits, and vegetables. Prunes (1 to 3 a day) work well for many people.  Drink enough fluids to keep your urine clear or pale yellow. SEEK IMMEDIATE MEDICAL CARE IF:   Your bleeding increases.  You feel lightheaded, weak, or you faint.  You have severe cramps in your back or abdomen.  You pass large blood clots in your stool.  Your problems are getting worse. MAKE SURE YOU:   Understand these instructions.  Will watch your condition.  Will get help right away if you are not doing well or get worse. Document Released: 07/26/2000 Document Revised: 07/15/2012 Document Reviewed: 07/08/2011 [facility] Patient Information 2014 [place], [place].

## 2013-02-17 ENCOUNTER — Telehealth: Payer: Self-pay | Admitting: Internal Medicine

## 2013-02-17 ENCOUNTER — Ambulatory Visit (INDEPENDENT_AMBULATORY_CARE_PROVIDER_SITE_OTHER): Payer: Medicare Other | Admitting: Gastroenterology

## 2013-02-17 ENCOUNTER — Encounter: Payer: Self-pay | Admitting: Internal Medicine

## 2013-02-17 ENCOUNTER — Encounter: Payer: Self-pay | Admitting: Gastroenterology

## 2013-02-17 VITALS — BP 108/60 | HR 64 | Ht 62.0 in | Wt 138.6 lb

## 2013-02-17 DIAGNOSIS — Z8711 Personal history of peptic ulcer disease: Secondary | ICD-10-CM

## 2013-02-17 DIAGNOSIS — R1013 Epigastric pain: Secondary | ICD-10-CM

## 2013-02-17 NOTE — Telephone Encounter (Signed)
Pt states she has been having epigastric pain and dark stools, saw her PCP yesterday and he put in a referral for GI. Pt scheduled to see Doug Sou PA today at 11am. Pt aware of appt date and time.

## 2013-02-17 NOTE — Progress Notes (Signed)
02/17/2013 [name] [id number] 06-Oct-1940   HISTORY OF PRESENT ILLNESS:  This is a 72 year old female who is a patient of Dr. Lamar Sprinkles.  She has not been seen since 2009 when she last had an EGD.  At that time she had an esophageal stricture, hiatal hernia, and GERD.  She has been on omeprazole 40 mg daily.  She has history of PUD in the 1990's as well.    Comes in today with a 6 day history of epigastric abdominal pain and dark stools.  Says that the pain started last Thursday and that same day she had a large, dark BM.  Since then she has been moving her bowels daily and they are still somewhat dark.  Pain is still present.  Had some carafate at home, and she started taking that.  Saw Dr. Debby Bud yesterday in the office.  Says that he did a rectal exam, which was NEGATIVE for blood.  Hgb was 11.6 grams, which was down from 13.1 grams just 3 weeks ago.  He increased her PPI to 40 mg BID.  Pain is still present and not getting any better.  No nausea or vomiting, but has reduced appetite.  Does not take NSAID's.    Past Medical History  Diagnosis Date  . Intention tremor   . Abdominal pain, epigastric   . Constipation   . PUD (peptic ulcer disease)   . Helicobacter pylori gastritis   . Constipation   . PVD (peripheral vascular disease)   . Lumbago   . Osteoarthritis of spine   . Dyspepsia   . Fibromyalgia   . Pneumonia     hx of years ago   . GERD (gastroesophageal reflux disease)   . Chronic kidney disease   . Esophageal stricture    Past Surgical History  Procedure Laterality Date  . Lumbar fusion    . Tubal ligation    . Tonsillectomy    . Breast enhancement surgery    . Abdominal hysterectomy    . Cataract extraction Bilateral   . Laparoscopic partial nephrectomy Left April '13    [facility], Dr. Laverle Patter    reports that she has never smoked. She has never used smokeless tobacco. She reports that she does not drink alcohol or use illicit drugs. family history includes COPD in  her mother; Diabetes in her daughter and father; Heart disease in her brother, father, and mother; and Stroke in her brother and father.  There is no history of Colon cancer. Allergies  Allergen Reactions  . Morphine And Related Itching and Rash    Given in IV.      Outpatient Encounter Prescriptions as of 02/17/2013  Medication Sig Dispense Refill  . ALPRAZolam (XANAX) 0.5 MG tablet       . amitriptyline (ELAVIL) 50 MG tablet Take 1 tablet (50 mg total) by mouth at bedtime.  30 tablet  12  . amLODipine (NORVASC) 5 MG tablet Take 1 tablet (5 mg total) by mouth daily.  30 tablet  10  . aspirin 325 MG tablet Take 325 mg by mouth daily.      . calcium-vitamin D (OSCAL WITH D) 500-200 MG-UNIT per tablet Take 1 tablet by mouth daily.      . furosemide (LASIX) 20 MG tablet TAKE 1 TABLET BY MOUTH DAILY  30 tablet  5  . metoprolol succinate (TOPROL-XL) 50 MG 24 hr tablet Take 1 tablet (50 mg total) by mouth daily. Take with or immediately following a meal.  30 tablet  5  . Multiple Vitamins-Minerals (MULTIVITAMIN WITH MINERALS) tablet Take 1 tablet by mouth daily.      Marland Kitchen omeprazole (PRILOSEC) 40 MG capsule Take 1 capsule (40 mg total) by mouth 2 (two) times daily.  60 capsule  1  . polyethylene glycol powder (GLYCOLAX/MIRALAX) powder Take 17 g by mouth daily.  255 g  2  . sucralfate (CARAFATE) 1 G tablet Take as crushed tablet in water (suspension) before meals and bedtime  120 tablet  3  . traMADol (ULTRAM) 50 MG tablet Take 2 tablets (100 mg total) by mouth every 8 (eight) hours as needed. BACK PAIN  180 tablet  3  . [DISCONTINUED] ALPRAZolam (XANAX) 0.5 MG tablet TAKE 1 TABLET BY MOUTH THREE TIMES DAILY AS NEEDED  100 tablet  0  . [DISCONTINUED] omeprazole (PRILOSEC) 20 MG capsule Take 2 capsules (40 mg total) by mouth daily.  60 capsule  5   No facility-administered encounter medications on file as of 02/17/2013.     REVIEW OF SYSTEMS  : All other systems reviewed and negative except where noted  in the History of Present Illness.   PHYSICAL EXAM: BP 108/60  Pulse 64  Ht 5\' 2"  (1.575 m)  Wt 138 lb 9.6 oz (62.869 kg)  BMI 25.34 kg/m2 General: Well developed white female in no acute distress Head: Normocephalic and atraumatic Eyes:  sclerae anicteric,conjunctive pink. Ears: Normal auditory acuity Lungs: Clear throughout to auscultation Heart: Regular rate and rhythm Abdomen: Soft, non-distended. No masses or hepatomegaly noted. Normal bowel sounds.  Moderate epigastric TTP without R/R/G. Musculoskeletal: Symmetrical with no gross deformities  Skin: No lesions on visible extremities Extremities: No edema  Neurological: Alert oriented x 4, grossly nonfocal Psychological:  Alert and cooperative. Normal mood and affect  ASSESSMENT AND PLAN: -Epigastric abdominal pain and dark stools (but heme negative on one occasion) in a patient with history of PUD.  Could have recurrent ulcer disease, however, not extremely likely since she has been on chronic PPI and does not take NSAID's.  *Will plan for EGD tomorrow, 7/10.  In the interim she will continue BID PPI and carafate as she has been doing.  If EGD negative then may want to consider ultrasound, CT, or other evaluation.

## 2013-02-17 NOTE — Assessment & Plan Note (Signed)
Probable upper GI bleed but today there is no sign of active bleeding. Stool was negative for blood. Hemoglobin has dropped from 13.1 to 11.6 grams.  Plan Increase omeprazole to 40 mg twice a day  Continue on carafate 1 g before meals and bedtime  Will request appointment with Dr. Marina Goodell for further evaluation.   Call for any signs of active bleeding: throwing up blood, frank blood per rectum, shortness of breath, unremitting pain.

## 2013-02-17 NOTE — Progress Notes (Signed)
Agree with assessment and plans 

## 2013-02-17 NOTE — Patient Instructions (Addendum)
You have been scheduled for an endoscopy with propofol. Please follow written instructions given to you at your visit today. If you use inhalers (even only as needed), please bring them with you on the day of your procedure. Your physician has requested that you go to www.[email or web address].com and enter the access code given to you at your visit today. This web site gives a general overview about your procedure. However, you should still follow specific instructions given to you by our office regarding your preparation for the procedure.                                               We are excited to introduce MyChart, a new best-in-class service that provides you online access to important information in your electronic medical record. We want to make it easier for you to view your health information - all in one secure location - when and where you need it. We expect MyChart will enhance the quality of care and service we provide.  When you register for MyChart, you can:    View your test results.    Request appointments and receive appointment reminders via email.    Request medication renewals.    View your medical history, allergies, medications and immunizations.    Communicate with your physician's office through a password-protected site.    Conveniently print information such as your medication lists.  To find out if MyChart is right for you, please talk to a member of our clinical staff today. We will gladly answer your questions about this free health and wellness tool.  If you are age 72 or older and want a member of your family to have access to your record, you must provide written consent by completing a proxy form available at our office. Please speak to our clinical staff about guidelines regarding accounts for patients younger than age 64.  As you activate your MyChart account and need any technical assistance, please call the MyChart technical support line at [phone]  [phone]) or email your question to [email or web address]. If you email your question(s), please include your name, a return phone number and the best time to reach you.  If you have non-urgent health-related questions, you can send a message to our office through MyChart at [email or web address]. If you have a medical emergency, call 911.  Thank you for using MyChart as your new health and wellness resource!   MyChart licensed from [employer],  [id number]. Patents Pending.

## 2013-02-18 ENCOUNTER — Encounter: Payer: Self-pay | Admitting: Internal Medicine

## 2013-02-18 ENCOUNTER — Ambulatory Visit (AMBULATORY_SURGERY_CENTER): Payer: Medicare Other | Admitting: Internal Medicine

## 2013-02-18 VITALS — BP 121/72 | HR 54 | Temp 97.2°F | Resp 24 | Ht 62.0 in | Wt 138.0 lb

## 2013-02-18 DIAGNOSIS — K219 Gastro-esophageal reflux disease without esophagitis: Secondary | ICD-10-CM

## 2013-02-18 DIAGNOSIS — R1013 Epigastric pain: Secondary | ICD-10-CM

## 2013-02-18 DIAGNOSIS — K222 Esophageal obstruction: Secondary | ICD-10-CM

## 2013-02-18 MED ORDER — SODIUM CHLORIDE 0.9 % IV SOLN: 500.0000 mL | INTRAVENOUS | Status: DC

## 2013-02-18 NOTE — Patient Instructions (Addendum)
YOU HAD AN ENDOSCOPIC PROCEDURE TODAY AT THE [facility]: Refer to the procedure report that was given to you for any specific questions about what was found during the examination.  If the procedure report does not answer your questions, please call your gastroenterologist to clarify.  If you requested that your care partner not be given the details of your procedure findings, then the procedure report has been included in a sealed envelope for you to review at your convenience later.  YOU SHOULD EXPECT: Some feelings of bloating in the abdomen. Passage of more gas than usual.  Walking can help get rid of the air that was put into your GI tract during the procedure and reduce the bloating. If you had a lower endoscopy (such as a colonoscopy or flexible sigmoidoscopy) you may notice spotting of blood in your stool or on the toilet paper. If you underwent a bowel prep for your procedure, then you may not have a normal bowel movement for a few days.  DIET: Your first meal following the procedure should be a light meal and then it is ok to progress to your normal diet.  A half-sandwich or bowl of soup is an example of a good first meal.  Heavy or fried foods are harder to digest and may make you feel nauseous or bloated.  Likewise meals heavy in dairy and vegetables can cause extra gas to form and this can also increase the bloating.  Drink plenty of fluids but you should avoid alcoholic beverages for 24 hours.  ACTIVITY: Your care partner should take you home directly after the procedure.  You should plan to take it easy, moving slowly for the rest of the day.  You can resume normal activity the day after the procedure however you should NOT DRIVE or use heavy machinery for 24 hours (because of the sedation medicines used during the test).    SYMPTOMS TO REPORT IMMEDIATELY: A gastroenterologist can be reached at any hour.  During normal business hours, 8:30 AM to 5:00 PM Monday through Friday,  call [phone].  After hours and on weekends, please call the GI answering service at [phone] who will take a message and have the physician on call contact you.    Following upper endoscopy (EGD)  Vomiting of blood or coffee ground material  New chest pain or pain under the shoulder blades  Painful or persistently difficult swallowing  New shortness of breath  Fever of 100F or higher  Black, tarry-looking stools  FOLLOW UP: If any biopsies were taken you will be contacted by phone or by letter within the next 1-3 weeks.  Call your gastroenterologist if you have not heard about the biopsies in 3 weeks.  Our staff will call the home number listed on your records the next business day following your procedure to check on you and address any questions or concerns that you may have at that time regarding the information given to you following your procedure. This is a courtesy call and so if there is no answer at the home number and we have not heard from you through the emergency physician on call, we will assume that you have returned to your regular daily activities without incident.  SIGNATURES/CONFIDENTIALITY: You and/or your care partner have signed paperwork which will be entered into your electronic medical record.  These signatures attest to the fact that that the information above on your After Visit Summary has been reviewed and is understood.  Full   responsibility of the confidentiality of this discharge information lies with you and/or your care-partner.  Esophageal stricture information given.  Continue Prilosec 40 mgs twice daily for 2 weeks, then once daily.  Continue Carafate for one week then discontinue.  Cal for office visit to be seen in 4 weeks.

## 2013-02-18 NOTE — Progress Notes (Signed)
Patient did not experience any of the following events: a burn prior to discharge; a fall within the facility; wrong site/side/patient/procedure/implant event; or a hospital transfer or hospital admission upon discharge from the facility. (G8907) Patient did not have preoperative order for IV antibiotic SSI prophylaxis. (G8918)  

## 2013-02-18 NOTE — Op Note (Signed)
[facility] [address] [place], [address]   ENDOSCOPY PROCEDURE REPORT  PATIENT: Kendra, [name].  MR#: [id number] BIRTHDATE: 1941-02-17 , 71  yrs. old GENDER: Female ENDOSCOPIST: Roxy Cedar, MD REFERRED BY:  .  Self / Office PROCEDURE DATE:  02/18/2013 PROCEDURE:  EGD, diagnostic ASA CLASS:     Class II INDICATIONS:  Epigastric pain.  "dark stools" (HEME NEGATIVE) MEDICATIONS: MAC sedation, administered by CRNA and propofol (Diprivan) 90mg  IV TOPICAL ANESTHETIC: Cetacaine Spray  DESCRIPTION OF PROCEDURE: After the risks benefits and alternatives of the procedure were thoroughly explained, informed consent was obtained.  The Pentax Gastroscope Peds [id number] endoscope was introduced through the mouth and advanced to the second portion of the duodenum. Without limitations.  The instrument was slowly withdrawn as the mucosa was fully examined.    EXAM: The upper, middle and distal third of the esophagus were carefully inspected and no mucosal abnormalities were noted.  The z-line was well seen at the GEJ. There was a benign large caliber ringlike stricture at the gastroesophageal junction. The endoscope was pushed into the fundus which was normal including a retroflexed view.  The antrum, gastric body, first and second part of the duodenum were unremarkable.  Retroflexed views revealed no abnormalities.     The scope was then withdrawn from the patient and the procedure completed.  COMPLICATIONS: There were no complications. ENDOSCOPIC IMPRESSION: 1. Incidental esophageal stricture 2. Otherwise Normal EGD . No ulcer disease.  RECOMMENDATIONS: 1.  Continue Prilosec 40 mg twice daily for 2 weeks then once daily. 2.  Continue Carafate for one week then discontinue 3.  Call for office visit to be seen in 4 weeks  REPEAT EXAM:  eSigned:  Roxy Cedar, MD 02/18/2013 9:01 AM   XB:[id number] Esther Hardy, MD and The Patient

## 2013-02-18 NOTE — Progress Notes (Signed)
Procedure ends, to recovery awake, report given and VSS. 

## 2013-02-19 ENCOUNTER — Telehealth: Payer: Self-pay | Admitting: *Deleted

## 2013-02-19 NOTE — Telephone Encounter (Signed)
  Follow up Call-  Call back number 02/18/2013  Post procedure Call Back phone  # [phone]  Permission to leave phone message Yes     Patient questions:  Do you have a fever, pain , or abdominal swelling? no Pain Score  0 *  Have you tolerated food without any problems? yes  Have you been able to return to your normal activities? yes  Do you have any questions about your discharge instructions: Diet   no Medications  no Follow up visit  no  Do you have questions or concerns about your Care? no  Actions: * If pain score is 4 or above: No action needed, pain <4.

## 2013-03-16 ENCOUNTER — Ambulatory Visit: Payer: Self-pay | Admitting: Diagnostic Neuroimaging

## 2013-04-20 ENCOUNTER — Encounter: Payer: Self-pay | Admitting: Internal Medicine

## 2013-04-20 ENCOUNTER — Ambulatory Visit (INDEPENDENT_AMBULATORY_CARE_PROVIDER_SITE_OTHER): Payer: Medicare Other | Admitting: Internal Medicine

## 2013-04-20 VITALS — BP 118/82 | HR 64 | Temp 98.6°F | Wt 139.8 lb

## 2013-04-20 DIAGNOSIS — G4700 Insomnia, unspecified: Secondary | ICD-10-CM

## 2013-04-20 DIAGNOSIS — I10 Essential (primary) hypertension: Secondary | ICD-10-CM

## 2013-04-20 DIAGNOSIS — Z23 Encounter for immunization: Secondary | ICD-10-CM

## 2013-04-20 NOTE — Patient Instructions (Addendum)
Sleep disorder - disrupted sleep patter. The key is good Sleep Hygiene:   Sleep is a learned or unlearned behavior. 5 principles of sleep hygiene - 1) regular hour to retire and rise 7days/wk 2) no stimulants - caffeine, chocolat, alcohol, 3) regular exercise  - every afternoon  4) sleep sanctuary - a space that is right light, temperature, sound level, good bed where all you do is sleep. 5) No extinction behaviors, e.g. Laying in bed awake doing anything but sleeping. This means if you have a bad night - no naps, etc  If this does not work for you send me a message by MyChart and we can try sleep medication on a limited basis.

## 2013-04-20 NOTE — Progress Notes (Signed)
Subjective:    Patient ID: [name], female    DOB: [birth year], 72 y.o.   MRN: [id number]  HPI Mrs. Debellis has stopped xanax but she is not sleeping.  Past Medical History  Diagnosis Date  . Intention tremor   . Abdominal pain, epigastric   . Constipation   . PUD (peptic ulcer disease)   . Helicobacter pylori gastritis   . Constipation   . PVD (peripheral vascular disease)   . Lumbago   . Osteoarthritis of spine   . Dyspepsia   . Fibromyalgia   . Pneumonia     hx of years ago   . GERD (gastroesophageal reflux disease)   . Chronic kidney disease   . Esophageal stricture    Past Surgical History  Procedure Laterality Date  . Lumbar fusion    . Tubal ligation    . Tonsillectomy    . Breast enhancement surgery    . Abdominal hysterectomy    . Cataract extraction Bilateral   . Laparoscopic partial nephrectomy Left April '13    RCC, Dr. Laverle Patter   Family History  Problem Relation Age of Onset  . Heart disease Mother   . COPD Mother   . Thyroid disease Mother   . Diabetes Father   . Heart disease Father   . Stroke Father   . Heart disease Brother   . Diabetes Daughter   . Colon cancer Neg Hx   . Stroke Brother   . Heart failure    . Heart disease     History   Social History  . Marital Status: Married    Spouse Name: N/A    Number of Children: 2  . Years of Education: BA   Occupational History  . 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 Smoking status: Never Smoker   . Smokeless tobacco: Never Used  . Alcohol Use: Yes     Comment: occasional glass of wine with dinner   . Drug Use: No  . Sexual Activity: Not on file   Other Topics Concern  . Not on file   Social History Narrative   Married- second marriage '76.  Patient has never smoked.  Alcohol use-no.  Daily caffeine use  3/4 cups per day.  Patient gets regular exercise.  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  Marriage is is better shape than previously.   Patient lives at home with her spouse.   Caffeine use: 1 cup of coffee and 1 diet coke daily.    Current Outpatient Prescriptions on File Prior to Visit  Medication Sig Dispense Refill  . amitriptyline (ELAVIL) 50 MG tablet Take 1 tablet (50 mg total) by mouth at bedtime.  30 tablet  12  . amLODipine (NORVASC) 5 MG tablet Take 1 tablet (5 mg total) by mouth daily.  30 tablet  10  . aspirin 325 MG tablet Take 325 mg by mouth daily.      . calcium-vitamin D (OSCAL WITH D) 500-200 MG-UNIT per tablet Take 1 tablet by mouth daily.      . furosemide (LASIX) 20 MG tablet TAKE 1 TABLET BY MOUTH DAILY  30 tablet  5  . metoprolol succinate (TOPROL-XL) 50 MG 24 hr tablet Take 1 tablet (50 mg total) by mouth daily. Take with or immediately following a meal.  30 tablet  5  . Multiple Vitamins-Minerals (MULTIVITAMIN  WITH MINERALS) tablet Take 1 tablet by mouth daily.      Marland Kitchen omeprazole (PRILOSEC) 40 MG capsule Take 1 capsule (40 mg total) by mouth 2 (two) times daily.  60 capsule  1  . polyethylene glycol powder (GLYCOLAX/MIRALAX) powder Take 17 g by mouth daily.  255 g  2  . sucralfate (CARAFATE) 1 G tablet Take as crushed tablet in water (suspension) before meals and bedtime  120 tablet  3  . traMADol (ULTRAM) 50 MG tablet Take 2 tablets (100 mg total) by mouth every 8 (eight) hours as needed. BACK PAIN  180 tablet  3   No current facility-administered medications on file prior to visit.      Review of Systems System review is negative for any constitutional, cardiac, pulmonary, GI or neuro symptoms or complaints other than as described in the HPI.      Objective:   Physical Exam Filed Vitals:   04/20/13 1040  BP: 118/82  Pulse: 64  Temp: 98.6 F (37 C)   Wt Readings from Last 3 Encounters:  04/20/13 139 lb 12.8 oz (63.413 kg)  02/18/13 138 lb (62.596 kg)  02/17/13 138 lb 9.6 oz (62.869 kg)   Gen'l - WNWD nicely groomed woman  in no distress Cor - RRR Pulm - normal respirations Neuro - A&O       Assessment & Plan:

## 2013-04-21 ENCOUNTER — Ambulatory Visit: Payer: Self-pay | Admitting: Diagnostic Neuroimaging

## 2013-04-21 DIAGNOSIS — G4700 Insomnia, unspecified: Secondary | ICD-10-CM | POA: Insufficient documentation

## 2013-04-21 NOTE — Assessment & Plan Note (Signed)
BP Readings from Last 3 Encounters:  04/20/13 118/82  02/18/13 121/72  02/17/13 108/60  \ Excellent control

## 2013-04-21 NOTE — Assessment & Plan Note (Signed)
Over the past few months change in sleep pattern.  Plan - sleep hygiene  (greater than 50% of 25 min visit spent on education and counseling)

## 2013-04-26 ENCOUNTER — Encounter: Payer: Self-pay | Admitting: Internal Medicine

## 2013-04-26 MED ORDER — TEMAZEPAM 7.5 MG PO CAPS: 7.5000 mg | ORAL_CAPSULE | Freq: Every evening | ORAL | Status: DC | PRN

## 2013-04-28 ENCOUNTER — Other Ambulatory Visit: Payer: Self-pay | Admitting: Internal Medicine

## 2013-05-24 ENCOUNTER — Ambulatory Visit (INDEPENDENT_AMBULATORY_CARE_PROVIDER_SITE_OTHER): Payer: Medicare Other | Admitting: Internal Medicine

## 2013-05-24 ENCOUNTER — Encounter: Payer: Self-pay | Admitting: Internal Medicine

## 2013-05-24 VITALS — BP 128/82 | HR 62 | Temp 99.1°F | Wt 138.0 lb

## 2013-05-24 DIAGNOSIS — F411 Generalized anxiety disorder: Secondary | ICD-10-CM

## 2013-05-24 DIAGNOSIS — G4700 Insomnia, unspecified: Secondary | ICD-10-CM

## 2013-05-24 DIAGNOSIS — IMO0001 Reserved for inherently not codable concepts without codable children: Secondary | ICD-10-CM

## 2013-05-24 MED ORDER — ZOLPIDEM TARTRATE 5 MG PO TABS: 5.0000 mg | ORAL_TABLET | Freq: Every evening | ORAL | Status: DC | PRN

## 2013-05-24 MED ORDER — FLUOXETINE HCL 10 MG PO TABS: 10.0000 mg | ORAL_TABLET | Freq: Every day | ORAL | Status: DC

## 2013-05-24 NOTE — Patient Instructions (Signed)
1. Sleep disorder - sleep hygiene is still the best approach. Plan As needed generic ambien 5 mg at bedtime  2. Anxiety - becoming more of a problem with daily activities, e.g. Driving. Plan Fluoxetine (prozac) 10 mg once a day - very low dose  3. Fibromyalgia - with the fluoxetine and the Remus Loffler it should be possible to stop the amitryptaline at bedtime.

## 2013-05-24 NOTE — Progress Notes (Signed)
  Subjective:    Patient ID: [name], female    DOB: 1940/11/30, 72 y.o.   MRN: [id number]  HPI Mrs. Mussa presents to discuss sleep medications. She has had recent visits for sleep disorder - notes reviewed. She had been encouraged to improve her sleep hygiene as principal treatment. However, she did need a hypnotic and was prescribed temazepam 7.5 which turns out to be too costly, although it did work. She is interested in an alternative medication.  As an additional problem she reports increasing anxiety with normal ADLs e.g. While driving or riding in a care, while traveling. This is a daily issue.   PMH, FamHx and SocHx reviewed for any changes and relevance.  Current Outpatient Prescriptions on File Prior to Visit  Medication Sig Dispense Refill  . amitriptyline (ELAVIL) 50 MG tablet Take 1 tablet (50 mg total) by mouth at bedtime.  30 tablet  12  . amLODipine (NORVASC) 5 MG tablet TAKE ONE TABLET BY MOUTH DAILY  30 tablet  5  . aspirin 325 MG tablet Take 325 mg by mouth daily.      . calcium-vitamin D (OSCAL WITH D) 500-200 MG-UNIT per tablet Take 1 tablet by mouth daily.      . furosemide (LASIX) 20 MG tablet TAKE 1 TABLET BY MOUTH DAILY  30 tablet  5  . metoprolol succinate (TOPROL-XL) 50 MG 24 hr tablet Take 1 tablet (50 mg total) by mouth daily. Take with or immediately following a meal.  30 tablet  5  . Multiple Vitamins-Minerals (MULTIVITAMIN WITH MINERALS) tablet Take 1 tablet by mouth daily.      Marland Kitchen omeprazole (PRILOSEC) 40 MG capsule Take 1 capsule (40 mg total) by mouth 2 (two) times daily.  60 capsule  1  . polyethylene glycol powder (GLYCOLAX/MIRALAX) powder Take 17 g by mouth daily.  255 g  2  . sucralfate (CARAFATE) 1 G tablet Take as crushed tablet in water (suspension) before meals and bedtime  120 tablet  3  . traMADol (ULTRAM) 50 MG tablet TAKE 2 TABLETS BY MOUTH EVERY 8 HOURS AS NEEDED FOR BACK PAIN  180 tablet  0   No current facility-administered medications  on file prior to visit.      Review of Systems System review is negative for any constitutional, cardiac, pulmonary, GI or neuro symptoms or complaints other than as described in the HPI.     Objective:   Physical Exam Filed Vitals:   05/24/13 1541  BP: 128/82  Pulse: 62  Temp: 99.1 F (37.3 C)   Gen'l- WNWD woman Cor- RRR Plum - normal respirations Neuro - CN II-XII grossly intact, normal gait, fluid speech.       Assessment & Plan:

## 2013-05-25 ENCOUNTER — Ambulatory Visit: Payer: Medicare Other | Admitting: Internal Medicine

## 2013-05-25 NOTE — Assessment & Plan Note (Signed)
She has been on elavil qHS for years at low dose. With the initiation of fluoxetine will attempt to wean off elavil.

## 2013-05-25 NOTE — Assessment & Plan Note (Signed)
Reviewed sleep meds and risks. She has been using a low dose benzodiazepine with success. Reviewed contra-indications: pseudo-dementia, increased risk of falls. Reviewed alternative medications  Plan Ambien 5 mg qHS prn.

## 2013-05-25 NOTE — Assessment & Plan Note (Signed)
Increasing problem of anxiety. Discussed treatment options.  Plan Fluoxetine 10 mg daily (low dose)

## 2013-06-01 ENCOUNTER — Other Ambulatory Visit: Payer: Self-pay | Admitting: Internal Medicine

## 2013-06-09 ENCOUNTER — Encounter: Payer: Self-pay | Admitting: Internal Medicine

## 2013-06-25 ENCOUNTER — Other Ambulatory Visit: Payer: Self-pay | Admitting: Internal Medicine

## 2013-06-28 ENCOUNTER — Ambulatory Visit: Payer: Self-pay | Admitting: Diagnostic Neuroimaging

## 2013-07-01 ENCOUNTER — Other Ambulatory Visit: Payer: Self-pay | Admitting: Internal Medicine

## 2013-07-16 ENCOUNTER — Other Ambulatory Visit: Payer: Self-pay | Admitting: Internal Medicine

## 2013-07-21 ENCOUNTER — Ambulatory Visit (HOSPITAL_COMMUNITY)
Admission: RE | Admit: 2013-07-21 | Discharge: 2013-07-21 | Disposition: A | Discharge status: Home / Self Care | Payer: Medicare Other | Source: Ambulatory Visit | Attending: Urology | Admitting: Urology

## 2013-07-21 ENCOUNTER — Other Ambulatory Visit (HOSPITAL_COMMUNITY): Payer: Self-pay | Admitting: Urology

## 2013-07-21 DIAGNOSIS — C649 Malignant neoplasm of unspecified kidney, except renal pelvis: Secondary | ICD-10-CM

## 2013-07-21 DIAGNOSIS — Z85528 Personal history of other malignant neoplasm of kidney: Secondary | ICD-10-CM | POA: Insufficient documentation

## 2013-07-21 DIAGNOSIS — Z981 Arthrodesis status: Secondary | ICD-10-CM | POA: Insufficient documentation

## 2013-07-22 ENCOUNTER — Telehealth: Payer: Self-pay | Admitting: *Deleted

## 2013-07-22 NOTE — Telephone Encounter (Signed)
1. Cash price is listed as $31.25 for 50 mg #30 - might beat that at [facility] 2. Plan 05/24/13 was to start and continue the prozac (fluoxetine) and try to wean off the amitryptaline. This is still a good plan and we can increase the fluoxetine if need be.

## 2013-07-22 NOTE — Telephone Encounter (Signed)
Pt called states effective 08/2013 Amitriptyline price will increase from $8 monthly to $95 monthly.  Pt is requesting a less expensive alternative.  Please advise

## 2013-07-23 NOTE — Telephone Encounter (Signed)
Spoke with pt advised of MDs message, pt requests appoint with MD.  Appoint 12.15.14 @ 1:15pm

## 2013-07-26 ENCOUNTER — Encounter: Payer: Self-pay | Admitting: Internal Medicine

## 2013-07-26 ENCOUNTER — Ambulatory Visit (INDEPENDENT_AMBULATORY_CARE_PROVIDER_SITE_OTHER): Payer: Medicare Other | Admitting: Internal Medicine

## 2013-07-26 VITALS — BP 110/80 | HR 74 | Temp 97.8°F | Wt 138.0 lb

## 2013-07-26 DIAGNOSIS — K922 Gastrointestinal hemorrhage, unspecified: Secondary | ICD-10-CM

## 2013-07-26 DIAGNOSIS — Z7189 Other specified counseling: Secondary | ICD-10-CM

## 2013-07-26 MED ORDER — SUCRALFATE 1 G PO TABS: ORAL_TABLET | ORAL | Status: DC

## 2013-07-26 NOTE — Patient Instructions (Signed)
"   You da boss" if the medication did not suit you and you don't feel that you need medication for anxiety or depression it is perfectly fine to not use it.   OK to continue the elavil for sleep aid and fibromyalgia. The direct cash price is more reasonable.  Let the IT people know about the problem you are having with you virus detection soft ware and MyChart

## 2013-07-26 NOTE — Progress Notes (Signed)
Pre visit review using our clinic review tool, if applicable. No additional management support is needed unless otherwise documented below in the visit note. 

## 2013-07-27 NOTE — Progress Notes (Signed)
   Subjective:    Patient ID: [name], female    DOB: 01-Apr-1941, 72 y.o.   MRN: [id number]  HPI Mrs. [name] presents to discuss medications. She has been started on Prozac but was intolerant and stopped. She has been using elavil for a long time and resumed. She has also been using Zolpidem. There was confusion about refill and med table so we will get that all straightened out.  PMH, FamHx and SocHx reviewed for any changes and relevance.  Current Outpatient Prescriptions on File Prior to Visit  Medication Sig Dispense Refill  . amitriptyline (ELAVIL) 50 MG tablet Take 1 tablet (50 mg total) by mouth at bedtime.  30 tablet  12  . amLODipine (NORVASC) 5 MG tablet TAKE ONE TABLET BY MOUTH DAILY  30 tablet  5  . aspirin 325 MG tablet Take 325 mg by mouth daily.      . calcium-vitamin D (OSCAL WITH D) 500-200 MG-UNIT per tablet Take 1 tablet by mouth daily.      . furosemide (LASIX) 20 MG tablet TAKE 1 TABLET BY MOUTH DAILY  30 tablet  5  . metoprolol succinate (TOPROL-XL) 50 MG 24 hr tablet TAKE 1 TABLET BY MOUTH ONCE DAILY. TAKE WITH OR IMMEDIATELY FOLLOWING A MEAL  30 tablet  5  . Multiple Vitamins-Minerals (MULTIVITAMIN WITH MINERALS) tablet Take 1 tablet by mouth daily.      Marland Kitchen omeprazole (PRILOSEC) 40 MG capsule TAKE ONE CAPSULE BY MOUTH TWICE DAILY  60 capsule  5  . polyethylene glycol powder (GLYCOLAX/MIRALAX) powder Take 17 g by mouth daily.  255 g  2  . traMADol (ULTRAM) 50 MG tablet TAKE 2 TABLETS BY MOUTH EVERY 8 HOURS AS NEEDED FOR BACK PAIN  180 tablet  0  . zolpidem (AMBIEN) 5 MG tablet Take 1 tablet (5 mg total) by mouth at bedtime as needed for sleep.  30 tablet  5  . metoprolol succinate (TOPROL-XL) 50 MG 24 hr tablet Take 1 tablet (50 mg total) by mouth daily. Take with or immediately following a meal.  30 tablet  5   No current facility-administered medications on file prior to visit.      Review of Systems System review is negative for any constitutional,  cardiac, pulmonary, GI or neuro symptoms or complaints other than as described in the HPI.     Objective:   Physical Exam Filed Vitals:   07/26/13 1314  BP: 110/80  Pulse: 74  Temp: 97.8 F (36.6 C)   Gen'l - WNWD woman in no distress Cor- 2+ radial pulse - regular Neuro - Awake and alert.       Assessment & Plan:  " You da boss" if the medication did not suit you and you don't feel that you need medication for anxiety or depression it is perfectly fine to not use it.   OK to continue the elavil for sleep aid and fibromyalgia. The direct cash price is more reasonable.

## 2013-07-30 ENCOUNTER — Other Ambulatory Visit: Payer: Self-pay | Admitting: Internal Medicine

## 2013-08-26 ENCOUNTER — Other Ambulatory Visit: Payer: Self-pay | Admitting: Internal Medicine

## 2013-09-07 ENCOUNTER — Telehealth: Payer: Self-pay

## 2013-09-07 MED ORDER — OSELTAMIVIR PHOSPHATE 75 MG PO CAPS: 75.0000 mg | ORAL_CAPSULE | Freq: Two times a day (BID) | ORAL | Status: DC

## 2013-09-07 NOTE — Telephone Encounter (Signed)
The patient called and is hoping to get tamiflu called in for her. She states she was visiting family, and they were diagnosed with the flu.  She now is experiencing symptoms.  She is hoping to avoid an ov and have the medication called in to the pharmacy.    Callback - [phone]

## 2013-09-07 NOTE — Telephone Encounter (Signed)
Ok for tamiflu 75 mg bid x 5  #10. Please document temperature - helps with the diagnosis. If she cannot take her temperature at home that is ok. Are her symptoms body aches, cough? If she is having respiratory difficulty - SOB - she will need to be seen.

## 2013-09-07 NOTE — Telephone Encounter (Signed)
Called patient - her symptoms are not c/w active influenza. For prophylaxis after close contact/exposure she should take tamiflu qD for 10 days.

## 2013-09-07 NOTE — Telephone Encounter (Signed)
I called and spoke to patient. NO fever. NO cough or SOB. She states sore throat,body ache and headache.

## 2013-09-27 ENCOUNTER — Other Ambulatory Visit: Payer: Self-pay | Admitting: Internal Medicine

## 2013-10-14 ENCOUNTER — Other Ambulatory Visit: Payer: Self-pay | Admitting: Internal Medicine

## 2013-10-27 ENCOUNTER — Other Ambulatory Visit: Payer: Self-pay | Admitting: Internal Medicine

## 2013-11-03 ENCOUNTER — Other Ambulatory Visit: Payer: Self-pay | Admitting: Internal Medicine

## 2013-11-15 ENCOUNTER — Other Ambulatory Visit: Payer: Self-pay | Admitting: *Deleted

## 2013-11-16 ENCOUNTER — Other Ambulatory Visit: Payer: Self-pay | Admitting: *Deleted

## 2013-11-16 MED ORDER — ZOLPIDEM TARTRATE 5 MG PO TABS: 5.0000 mg | ORAL_TABLET | Freq: Every evening | ORAL | Status: DC | PRN

## 2013-11-16 NOTE — Telephone Encounter (Signed)
Received fax pt needing PA on her zolpidem. Called insurance they have fax over PA form completed and fax back. Waiting on approval status...Johny Chess

## 2013-11-16 NOTE — Telephone Encounter (Signed)
Faxed script back to [facility].../lmb 

## 2013-11-17 NOTE — Telephone Encounter (Signed)
Received PA back med has been approved. Notified pharmacy with approval status...[name]

## 2013-11-22 ENCOUNTER — Encounter: Payer: Self-pay | Admitting: Internal Medicine

## 2013-11-22 ENCOUNTER — Ambulatory Visit (INDEPENDENT_AMBULATORY_CARE_PROVIDER_SITE_OTHER): Payer: Medicare Other | Admitting: Internal Medicine

## 2013-11-22 VITALS — BP 120/88 | HR 78 | Temp 97.1°F | Wt 137.5 lb

## 2013-11-22 DIAGNOSIS — IMO0001 Reserved for inherently not codable concepts without codable children: Secondary | ICD-10-CM

## 2013-11-22 DIAGNOSIS — I10 Essential (primary) hypertension: Secondary | ICD-10-CM

## 2013-11-22 DIAGNOSIS — Z0000 Encounter for general adult medical examination without abnormal findings: Secondary | ICD-10-CM

## 2013-11-22 NOTE — Assessment & Plan Note (Signed)
BP Readings from Last 3 Encounters:  11/22/13 120/88  07/26/13 110/80  05/24/13 128/82   The current medical regimen is effective;  continue present plan and medications.

## 2013-11-22 NOTE — Patient Instructions (Addendum)
It was good to see you today.  We have reviewed your prior records including labs and tests today  Health Maintenance reviewed - all recommended immunizations and age-appropriate screenings are up-to-date.  Test(s) ordered today. Return when you are fasting -Your results will be released to [place] (or called to you) after review, usually within 72hours after test completion. If any changes need to be made, you will be notified at that same time.  Medications reviewed and updated, no changes recommended at this time.  Please schedule followup in 6 months for semiannual exam and review, call sooner if problems.  Health Maintenance, Female A healthy lifestyle and preventative care can promote health and wellness.  Maintain regular health, dental, and eye exams.  Eat a healthy diet. Foods like vegetables, fruits, whole grains, low-fat dairy products, and lean protein foods contain the nutrients you need without too many calories. Decrease your intake of foods high in solid fats, added sugars, and salt. Get information about a proper diet from your caregiver, if necessary.  Regular physical exercise is one of the most important things you can do for your health. Most adults should get at least 150 minutes of moderate-intensity exercise (any activity that increases your heart rate and causes you to sweat) each week. In addition, most adults need muscle-strengthening exercises on 2 or more days a week.   Maintain a healthy weight. The body mass index (BMI) is a screening tool to identify possible weight problems. It provides an estimate of body fat based on height and weight. Your caregiver can help determine your BMI, and can help you achieve or maintain a healthy weight. For adults 20 years and older:  A BMI below 18.5 is considered underweight.  A BMI of 18.5 to 24.9 is normal.  A BMI of 25 to 29.9 is considered overweight.  A BMI of 30 and above is considered obese.  Maintain normal  blood lipids and cholesterol by exercising and minimizing your intake of saturated fat. Eat a balanced diet with plenty of fruits and vegetables. Blood tests for lipids and cholesterol should begin at age 42 and be repeated every 5 years. If your lipid or cholesterol levels are high, you are over 50, or you are a high risk for heart disease, you may need your cholesterol levels checked more frequently.Ongoing high lipid and cholesterol levels should be treated with medicines if diet and exercise are not effective.  If you smoke, find out from your caregiver how to quit. If you do not use tobacco, do not start.  Lung cancer screening is recommended for adults aged 12 80 years who are at high risk for developing lung cancer because of a history of smoking. Yearly low-dose computed tomography (CT) is recommended for people who have at least a 30-pack-year history of smoking and are a current smoker or have quit within the past 15 years. A pack year of smoking is smoking an average of 1 pack of cigarettes a day for 1 year (for example: 1 pack a day for 30 years or 2 packs a day for 15 years). Yearly screening should continue until the smoker has stopped smoking for at least 15 years. Yearly screening should also be stopped for people who develop a health problem that would prevent them from having lung cancer treatment.  If you are pregnant, do not drink alcohol. If you are breastfeeding, be very cautious about drinking alcohol. If you are not pregnant and choose to drink alcohol, do not exceed 1  drink per day. One drink is considered to be 12 ounces (355 mL) of beer, 5 ounces (148 mL) of wine, or 1.5 ounces (44 mL) of liquor.  Avoid use of street drugs. Do not share needles with anyone. Ask for help if you need support or instructions about stopping the use of drugs.  High blood pressure causes heart disease and increases the risk of stroke. Blood pressure should be checked at least every 1 to 2 years.  Ongoing high blood pressure should be treated with medicines, if weight loss and exercise are not effective.  If you are 80 to 73 years old, ask your caregiver if you should take aspirin to prevent strokes.  Diabetes screening involves taking a blood sample to check your fasting blood sugar level. This should be done once every 3 years, after age 75, if you are within normal weight and without risk factors for diabetes. Testing should be considered at a younger age or be carried out more frequently if you are overweight and have at least 1 risk factor for diabetes.  Breast cancer screening is essential preventative care for women. You should practice "breast self-awareness." This means understanding the normal appearance and feel of your breasts and may include breast self-examination. Any changes detected, no matter how small, should be reported to a caregiver. Women in their 35s and 30s should have a clinical breast exam (CBE) by a caregiver as part of a regular health exam every 1 to 3 years. After age 26, women should have a CBE every year. Starting at age 14, women should consider having a mammogram (breast X-ray) every year. Women who have a family history of breast cancer should talk to their caregiver about genetic screening. Women at a high risk of breast cancer should talk to their caregiver about having an MRI and a mammogram every year.  Breast cancer gene (BRCA)-related cancer risk assessment is recommended for women who have family members with BRCA-related cancers. BRCA-related cancers include breast, ovarian, tubal, and peritoneal cancers. Having family members with these cancers may be associated with an increased risk for harmful changes (mutations) in the breast cancer genes BRCA1 and BRCA2. Results of the assessment will determine the need for genetic counseling and BRCA1 and BRCA2 testing.  The Pap test is a screening test for cervical cancer. Women should have a Pap test starting at  age 35. Between ages 56 and 67, Pap tests should be repeated every 2 years. Beginning at age 54, you should have a Pap test every 3 years as long as the past 3 Pap tests have been normal. If you had a hysterectomy for a problem that was not cancer or a condition that could lead to cancer, then you no longer need Pap tests. If you are between ages 66 and 31, and you have had normal Pap tests going back 10 years, you no longer need Pap tests. If you have had past treatment for cervical cancer or a condition that could lead to cancer, you need Pap tests and screening for cancer for at least 20 years after your treatment. If Pap tests have been discontinued, risk factors (such as a new sexual partner) need to be reassessed to determine if screening should be resumed. Some women have medical problems that increase the chance of getting cervical cancer. In these cases, your caregiver may recommend more frequent screening and Pap tests.  The human papillomavirus (HPV) test is an additional test that may be used for cervical cancer screening.  The HPV test looks for the virus that can cause the cell changes on the cervix. The cells collected during the Pap test can be tested for HPV. The HPV test could be used to screen women aged 24 years and older, and should be used in women of any age who have unclear Pap test results. After the age of 69, women should have HPV testing at the same frequency as a Pap test.  Colorectal cancer can be detected and often prevented. Most routine colorectal cancer screening begins at the age of 70 and continues through age 8. However, your caregiver may recommend screening at an earlier age if you have risk factors for colon cancer. On a yearly basis, your caregiver may provide home test kits to check for hidden blood in the stool. Use of a small camera at the end of a tube, to directly examine the colon (sigmoidoscopy or colonoscopy), can detect the earliest forms of colorectal cancer.  Talk to your caregiver about this at age 77, when routine screening begins. Direct examination of the colon should be repeated every 5 to 10 years through age 88, unless early forms of pre-cancerous polyps or small growths are found.  Hepatitis C blood testing is recommended for all people born from [birth year] through 1965 and any individual with known risks for hepatitis C.  Practice safe sex. Use condoms and avoid high-risk sexual practices to reduce the spread of sexually transmitted infections (STIs). Sexually active women aged 50 and younger should be checked for Chlamydia, which is a common sexually transmitted infection. Older women with new or multiple partners should also be tested for Chlamydia. Testing for other STIs is recommended if you are sexually active and at increased risk.  Osteoporosis is a disease in which the bones lose minerals and strength with aging. This can result in serious bone fractures. The risk of osteoporosis can be identified using a bone density scan. Women ages 42 and over and women at risk for fractures or osteoporosis should discuss screening with their caregivers. Ask your caregiver whether you should be taking a calcium supplement or vitamin D to reduce the rate of osteoporosis.  Menopause can be associated with physical symptoms and risks. Hormone replacement therapy is available to decrease symptoms and risks. You should talk to your caregiver about whether hormone replacement therapy is right for you.  Use sunscreen. Apply sunscreen liberally and repeatedly throughout the day. You should seek shade when your shadow is shorter than you. Protect yourself by wearing long sleeves, pants, a wide-brimmed hat, and sunglasses year round, whenever you are outdoors.  Notify your caregiver of new moles or changes in moles, especially if there is a change in shape or color. Also notify your caregiver if a mole is larger than the size of a pencil eraser.  Stay current with your  immunizations. Document Released: 02/11/2011 Document Revised: 11/23/2012 Document Reviewed: 02/11/2011 [facility] Patient Information 2014 [place].

## 2013-11-22 NOTE — Progress Notes (Signed)
Subjective:    Patient ID: [name], female    DOB: 07-30-1941, 73 y.o.   MRN: [id number]  HPI Transfer from [facility] to me due to retirement -  Here for medicare wellness  Diet: heart healthy Physical activity: sedentary Depression/mood screen: negative Hearing: intact to whispered voice Visual acuity: grossly normal, performs annual eye exam  ADLs: capable Fall risk: none Home safety: good Cognitive evaluation: intact to orientation, naming, recall and repetition EOL planning: adv directives, full code/ I agree  I have personally reviewed and have noted 1. The patient's medical and social history 2. Their use of alcohol, tobacco or illicit drugs 3. Their current medications and supplements 4. The patient's functional ability including ADL's, fall risks, home safety risks and hearing or visual impairment. 5. Diet and physical activities 6. Evidence for depression or mood disorders  also reviewed chronic medical issues and interval medical events  Past Medical History  Diagnosis Date  . Intention tremor   . PUD (peptic ulcer disease)   . Helicobacter pylori gastritis   . PVD (peripheral vascular disease)   . Lumbago   . Osteoarthritis of spine   . Fibromyalgia   . Pneumonia     hx of years ago   . GERD (gastroesophageal reflux disease)   . Esophageal stricture    Family History  Problem Relation Age of Onset  . Heart disease Mother   . COPD Mother   . Thyroid disease Mother   . Diabetes Father   . Heart disease Father     CABG  . Stroke Father   . Heart disease Brother   . Diabetes Daughter   . Colon cancer Neg Hx   . Stroke Brother   . Parkinson's disease Father    History  Substance Use Topics  . Smoking status: Never Smoker   . Smokeless tobacco: Never Used  . Alcohol Use: Yes     Comment: occasional glass of wine with dinner     Review of Systems  Constitutional: Negative for fatigue and unexpected weight change.  Respiratory: Negative for  cough and wheezing.   Cardiovascular: Negative for leg swelling.  Gastrointestinal: Negative for nausea, abdominal pain and diarrhea.  Musculoskeletal: Positive for arthralgias (back, hands -chronic), back pain and myalgias (fibro). Negative for gait problem.  Neurological: Negative for dizziness, weakness, light-headedness and headaches.  Psychiatric/Behavioral: Positive for sleep disturbance (but controlled with elavil). Negative for suicidal ideas and dysphoric mood. The patient is not nervous/anxious.   All other systems reviewed and are negative.      Objective:   Physical Exam  BP 120/88  Pulse 78  Temp(Src) 97.1 F (36.2 C) (Oral)  Wt 137 lb 8 oz (62.37 kg)  SpO2 98% Wt Readings from Last 3 Encounters:  11/22/13 137 lb 8 oz (62.37 kg)  07/26/13 138 lb (62.596 kg)  05/24/13 138 lb (62.596 kg)   Constitutional: She appears well-developed and well-nourished. No distress.  HENT: Head: Normocephalic and atraumatic. Ears: B TMs ok, no erythema or effusion; Nose: Nose normal. Mouth/Throat: Oropharynx is clear and moist. No oropharyngeal exudate.  Eyes: Conjunctivae and EOM are normal. Pupils are equal, round, and reactive to light. No scleral icterus.  Neck: Normal range of motion. Neck supple. No JVD present. No thyromegaly present.  Cardiovascular: Normal rate, regular rhythm and normal heart sounds.  No murmur heard. No BLE edema. Pulmonary/Chest: Effort normal and breath sounds normal. No respiratory distress. She has no wheezes.  Abdominal: Soft. Bowel sounds are  normal. She exhibits no distension. There is no tenderness. no masses Musculoskeletal: OA changes -Normal range of motion, no joint effusions. No gross deformities Neurological: She is alert and oriented to person, place, and time. No cranial nerve deficit. Coordination, balance, strength, speech and gait are normal.  Skin: Skin is warm and dry. No rash noted. No erythema.  Psychiatric: She has a normal mood and  affect. Her behavior is normal. Judgment and thought content normal.    Lab Results  Component Value Date   WBC 7.7 01/26/2013   HGB 11.6* 02/16/2013   HCT 34.4* 02/16/2013   PLT 320.0 01/26/2013   GLUCOSE 88 01/26/2013   ALT 16 01/26/2013   AST 25 01/26/2013   NA 139 01/26/2013   K 4.0 01/26/2013   CL 102 01/26/2013   CREATININE 0.7 01/26/2013   BUN 13 01/26/2013   CO2 26 01/26/2013   TSH 3.31 01/26/2013   HGBA1C 5.8 05/04/2009    Dg Chest 2 View  07/21/2013   CLINICAL DATA:  History of renal malignancy  EXAM: CHEST  2 VIEW  COMPARISON:  Chest x-ray dated Dec 28, 2012.  FINDINGS: The lungs are adequately inflated and clear. The cardiopericardial silhouette is normal in size. The pulmonary vascularity is not engorged. The mediastinum is normal in width. There is no pleural effusion or pneumothorax. The observed portions of the bony thorax exhibit no acute abnormalities. No lytic or blastic lesion is demonstrated. The patient has undergone previous lower lumbar fusion posteriorly. Bilateral breast implants are present.  IMPRESSION: There is no evidence of acute cardiopulmonary abnormality. Specifically there are no findings to suggest metastatic disease to the thorax.   Electronically Signed   By: David  Martinique   On: 07/21/2013 10:52       Assessment & Plan:   CPX/AWV/v70.0- Today patient counseled on age appropriate routine health concerns for screening and prevention, each reviewed and up to date or declined. Immunizations reviewed and up to date or declined. Labs reviewed. Risk factors for depression reviewed and negative. Hearing function and visual acuity are intact. ADLs screened and addressed as needed. Functional ability and level of safety reviewed and appropriate. Education, counseling and referrals performed based on assessed risks today. Patient provided with a copy of personalized plan for preventive services.  Problem List Items Addressed This Visit   FIBROMYALGIA     Chronic and stable  issue Takes TCA qhs and tramadol TID - planning to wean off same and use tylenol prn Avoid regular NSAIDs due to PUD/esoph stricture hx (EGD 02/2013 reviewed) The current medical regimen is effective;  continue present plan and medications.     Relevant Orders      CBC with Differential      Hepatic function panel      TSH   HTN (hypertension), benign      BP Readings from Last 3 Encounters:  11/22/13 120/88  07/26/13 110/80  05/24/13 128/82   The current medical regimen is effective;  continue present plan and medications.     Relevant Orders      Basic metabolic panel      Lipid panel      Urinalysis, Routine w reflex microscopic    Other Visit Diagnoses   Routine general medical examination at a health care facility    -  Primary    Relevant Orders       Basic metabolic panel       CBC with Differential  Hepatic function panel       Lipid panel       TSH       Urinalysis, Routine w reflex microscopic

## 2013-11-22 NOTE — Progress Notes (Signed)
Pre visit review using our clinic review tool, if applicable. No additional management support is needed unless otherwise documented below in the visit note. 

## 2013-11-22 NOTE — Assessment & Plan Note (Signed)
Chronic and stable issue Takes TCA qhs and tramadol TID - planning to wean off same and use tylenol prn Avoid regular NSAIDs due to PUD/esoph stricture hx (EGD 02/2013 reviewed) The current medical regimen is effective;  continue present plan and medications.

## 2013-11-23 ENCOUNTER — Telehealth: Payer: Self-pay | Admitting: Internal Medicine

## 2013-11-23 NOTE — Telephone Encounter (Signed)
Relevant patient education assigned to patient using Emmi. ° °

## 2013-11-25 ENCOUNTER — Other Ambulatory Visit (INDEPENDENT_AMBULATORY_CARE_PROVIDER_SITE_OTHER): Payer: Medicare Other

## 2013-11-25 DIAGNOSIS — IMO0001 Reserved for inherently not codable concepts without codable children: Secondary | ICD-10-CM

## 2013-11-25 DIAGNOSIS — I10 Essential (primary) hypertension: Secondary | ICD-10-CM

## 2013-11-25 DIAGNOSIS — Z0000 Encounter for general adult medical examination without abnormal findings: Secondary | ICD-10-CM

## 2013-11-25 LAB — TSH: TSH: 5.62 u[IU]/mL — ABNORMAL HIGH (ref 0.35–5.50)

## 2013-11-25 LAB — URINALYSIS, ROUTINE W REFLEX MICROSCOPIC
Bilirubin Urine: NEGATIVE
Hgb urine dipstick: NEGATIVE
Ketones, ur: NEGATIVE
Leukocytes, UA: NEGATIVE
Nitrite: NEGATIVE
Specific Gravity, Urine: 1.01 (ref 1.000–1.030)
Total Protein, Urine: NEGATIVE
Urine Glucose: NEGATIVE
Urobilinogen, UA: 0.2 (ref 0.0–1.0)
pH: 7 (ref 5.0–8.0)

## 2013-11-25 LAB — CBC WITH DIFFERENTIAL/PLATELET
Basophils Absolute: 0 10*3/uL (ref 0.0–0.1)
Basophils Relative: 0.5 % (ref 0.0–3.0)
Eosinophils Absolute: 0.2 10*3/uL (ref 0.0–0.7)
Eosinophils Relative: 3.6 % (ref 0.0–5.0)
HCT: 36.6 % (ref 36.0–46.0)
Hemoglobin: 12.3 g/dL (ref 12.0–15.0)
Lymphocytes Relative: 33.5 % (ref 12.0–46.0)
Lymphs Abs: 2.1 10*3/uL (ref 0.7–4.0)
MCHC: 33.7 g/dL (ref 30.0–36.0)
MCV: 90.2 fl (ref 78.0–100.0)
Monocytes Absolute: 0.5 10*3/uL (ref 0.1–1.0)
Monocytes Relative: 8.8 % (ref 3.0–12.0)
Neutro Abs: 3.3 10*3/uL (ref 1.4–7.7)
Neutrophils Relative %: 53.6 % (ref 43.0–77.0)
Platelets: 313 10*3/uL (ref 150.0–400.0)
RBC: 4.06 Mil/uL (ref 3.87–5.11)
RDW: 13.2 % (ref 11.5–14.6)
WBC: 6.2 10*3/uL (ref 4.5–10.5)

## 2013-11-25 LAB — HEPATIC FUNCTION PANEL
ALT: 17 U/L (ref 0–35)
AST: 24 U/L (ref 0–37)
Albumin: 4.1 g/dL (ref 3.5–5.2)
Alkaline Phosphatase: 66 U/L (ref 39–117)
Bilirubin, Direct: 0.1 mg/dL (ref 0.0–0.3)
Total Bilirubin: 0.4 mg/dL (ref 0.3–1.2)
Total Protein: 7.3 g/dL (ref 6.0–8.3)

## 2013-11-25 LAB — BASIC METABOLIC PANEL
BUN: 13 mg/dL (ref 6–23)
CO2: 31 mEq/L (ref 19–32)
Calcium: 9.2 mg/dL (ref 8.4–10.5)
Chloride: 101 mEq/L (ref 96–112)
Creatinine, Ser: 0.8 mg/dL (ref 0.4–1.2)
GFR: 80.56 mL/min (ref >60.00)
Glucose, Bld: 74 mg/dL (ref 70–99)
Potassium: 3.9 mEq/L (ref 3.5–5.1)
Sodium: 137 mEq/L (ref 135–145)

## 2013-11-25 LAB — LIPID PANEL
Cholesterol: 178 mg/dL (ref 0–200)
HDL: 57.7 mg/dL (ref >39.00)
LDL Cholesterol: 106 mg/dL — ABNORMAL HIGH (ref 0–99)
Total CHOL/HDL Ratio: 3
Triglycerides: 72 mg/dL (ref 0.0–149.0)
VLDL: 14.4 mg/dL (ref 0.0–40.0)

## 2013-11-29 ENCOUNTER — Other Ambulatory Visit: Payer: Self-pay | Admitting: *Deleted

## 2013-11-29 MED ORDER — TRAMADOL HCL 50 MG PO TABS: 100.0000 mg | ORAL_TABLET | Freq: Three times a day (TID) | ORAL | Status: DC | PRN

## 2013-11-29 NOTE — Telephone Encounter (Signed)
F[facility]ed script [facility]ck to [facility]...[name]

## 2013-12-20 ENCOUNTER — Other Ambulatory Visit: Payer: Self-pay

## 2013-12-20 MED ORDER — POLYETHYLENE GLYCOL 3350 17 GM/SCOOP PO POWD: ORAL | Status: DC

## 2014-01-13 ENCOUNTER — Other Ambulatory Visit: Payer: Self-pay | Admitting: *Deleted

## 2014-01-13 MED ORDER — METOPROLOL SUCCINATE ER 50 MG PO TB24: ORAL_TABLET | ORAL | Status: DC

## 2014-01-25 ENCOUNTER — Ambulatory Visit (HOSPITAL_COMMUNITY)
Admission: RE | Admit: 2014-01-25 | Discharge: 2014-01-25 | Disposition: A | Discharge status: Home / Self Care | Payer: Medicare Other | Source: Ambulatory Visit | Attending: Urology | Admitting: Urology

## 2014-01-25 ENCOUNTER — Other Ambulatory Visit (HOSPITAL_COMMUNITY): Payer: Self-pay | Admitting: Urology

## 2014-01-25 DIAGNOSIS — C649 Malignant neoplasm of unspecified kidney, except renal pelvis: Secondary | ICD-10-CM

## 2014-01-25 DIAGNOSIS — Z85528 Personal history of other malignant neoplasm of kidney: Secondary | ICD-10-CM | POA: Insufficient documentation

## 2014-02-07 ENCOUNTER — Other Ambulatory Visit: Payer: Self-pay

## 2014-02-07 ENCOUNTER — Other Ambulatory Visit: Payer: Self-pay | Admitting: *Deleted

## 2014-02-07 MED ORDER — FUROSEMIDE 20 MG PO TABS: ORAL_TABLET | ORAL | Status: DC

## 2014-02-11 ENCOUNTER — Other Ambulatory Visit: Payer: Self-pay | Admitting: Internal Medicine

## 2014-02-14 NOTE — Telephone Encounter (Signed)
Faxed script back to [facility].../lmb 

## 2014-02-15 ENCOUNTER — Telehealth: Payer: Self-pay

## 2014-02-15 MED ORDER — TRAMADOL HCL 50 MG PO TABS: ORAL_TABLET | ORAL | Status: DC

## 2014-02-15 NOTE — Telephone Encounter (Signed)
[facility] request for Tramadol 50mg   2 tabs po q8h prn for pain

## 2014-02-15 NOTE — Telephone Encounter (Signed)
Tramadol refill faxed to [facility] number and left message for her to call us back.

## 2014-02-15 NOTE — Telephone Encounter (Signed)
Done. thanks

## 2014-02-28 ENCOUNTER — Other Ambulatory Visit: Payer: Self-pay

## 2014-02-28 DIAGNOSIS — K922 Gastrointestinal hemorrhage, unspecified: Secondary | ICD-10-CM

## 2014-02-28 MED ORDER — AMITRIPTYLINE HCL 50 MG PO TABS: 50.0000 mg | ORAL_TABLET | Freq: Every day | ORAL | Status: DC

## 2014-03-25 ENCOUNTER — Other Ambulatory Visit: Payer: Self-pay | Admitting: Internal Medicine

## 2014-03-25 NOTE — Telephone Encounter (Signed)
Done hardcopy to jasmine

## 2014-03-25 NOTE — Telephone Encounter (Signed)
MD is out pls advise on refill.../lmb 

## 2014-04-06 ENCOUNTER — Other Ambulatory Visit: Payer: Self-pay | Admitting: Internal Medicine

## 2014-04-19 ENCOUNTER — Encounter: Payer: Self-pay | Admitting: Internal Medicine

## 2014-04-19 ENCOUNTER — Ambulatory Visit (INDEPENDENT_AMBULATORY_CARE_PROVIDER_SITE_OTHER): Payer: Medicare Other | Admitting: Internal Medicine

## 2014-04-19 ENCOUNTER — Other Ambulatory Visit (INDEPENDENT_AMBULATORY_CARE_PROVIDER_SITE_OTHER): Payer: Medicare Other

## 2014-04-19 VITALS — BP 100/80 | HR 69 | Temp 97.9°F | Wt 140.5 lb

## 2014-04-19 DIAGNOSIS — M25519 Pain in unspecified shoulder: Secondary | ICD-10-CM

## 2014-04-19 DIAGNOSIS — M25512 Pain in left shoulder: Principal | ICD-10-CM

## 2014-04-19 DIAGNOSIS — IMO0001 Reserved for inherently not codable concepts without codable children: Secondary | ICD-10-CM

## 2014-04-19 DIAGNOSIS — M25511 Pain in right shoulder: Secondary | ICD-10-CM

## 2014-04-19 LAB — SEDIMENTATION RATE: Sed Rate: 17 mm/hr (ref 0–22)

## 2014-04-19 MED ORDER — MELOXICAM 7.5 MG PO TABS: ORAL_TABLET | ORAL | Status: DC

## 2014-04-19 NOTE — Progress Notes (Signed)
Pre visit review using our clinic review tool, if applicable. No additional management support is needed unless otherwise documented below in the visit note. 

## 2014-04-19 NOTE — Patient Instructions (Signed)
Use an anti-inflammatory cream such as Aspercreme or Zostrix cream twice a day to the affected area as needed. In lieu of this warm moist compresses or  hot water bottle can be used. Do not apply ice .  Your next office appointment will be determined based upon review of your pending labs . Those instructions will be transmitted to you through My Chart .

## 2014-04-19 NOTE — Progress Notes (Signed)
   Subjective:    Patient ID: [name], female    DOB: Dec 27, 1940, 73 y.o.   MRN: [id number]  HPI   She began to have pain in both shoulders 04/16/14. This is bilateral but worse on the right. There was no trigger or injury such as repetitive motion for the pain.  It can be up to 8-9 in severity intensity but it is constant. She's taken some nonsteroidals with minimal response.  The pain is worse reaching particularly extending the right arm above her head. The range of motion is decreased.  She has some weakness in the upper extremities.  She has chronic neck and back issues.  Significant past medical history includes back surgery in 2004.  She also was treated for fibromyalgia in the 90s at [facility]. This was in the context of a stressful job. She was prescribed 150 mg of amitriptyline to treat the fibromyalgia symptoms. Now she is on a dose of 50 mg as maintenance.     Review of Systems  She specifically denies any change in color or temperature of skin in the area of pain  She has no fever, chills, sweats, weight change  She denies significant associated myalgias.  She has no numbness or tingling of extremities  She's had no loss control of her bladder or bowels.       Objective:   Physical Exam   Positive or pertinent findings include: She appears much younger than stated age. She has an upper dental plate. She has decreased range of motion of cervical spine. There is weakness to opposition in her hands, more so on the right the left. She has pain in her shoulders and with range of motion. Slight crepitus is noted in the right shoulder. She is unable to extend the arms more than 15 above the shoulder level.  General appearance :adequately nourished; in no distress. Eyes: No conjunctival inflammation or scleral icterus is present. Oral exam:  Lips and gums are healthy appearing.There is no oropharyngeal erythema or exudate noted.  Heart:  Normal rate and  regular rhythm. S1 and S2 normal without gallop, murmur, click, rub or other extra sounds   Lungs:Chest clear to auscultation; no wheezes, rhonchi,rales ,or rubs present.No increased work of breathing.  Skin:Warm & dry.  Intact without suspicious lesions or rashes ; no jaundice or tenting Lymphatic: No lymphadenopathy is noted about the head, neck, axilla            Assessment & Plan:  #1 bilateral shoulder pain; rule out polymyalgia rheumatica  #2 history of fibromyalgia  Plan: See orders and recommendations

## 2014-05-03 ENCOUNTER — Telehealth: Payer: Self-pay | Admitting: Internal Medicine

## 2014-05-03 NOTE — Telephone Encounter (Signed)
Spoke to pt, she wants to keep appointment with Dr. Tamala Julian.

## 2014-05-03 NOTE — Telephone Encounter (Signed)
Patient would like to be referred to an Ortho doctor instead of Dr. Tamala Julian.

## 2014-05-04 ENCOUNTER — Other Ambulatory Visit (INDEPENDENT_AMBULATORY_CARE_PROVIDER_SITE_OTHER): Payer: Medicare Other

## 2014-05-04 ENCOUNTER — Encounter: Payer: Self-pay | Admitting: Family Medicine

## 2014-05-04 ENCOUNTER — Ambulatory Visit (INDEPENDENT_AMBULATORY_CARE_PROVIDER_SITE_OTHER): Payer: Medicare Other | Admitting: Family Medicine

## 2014-05-04 ENCOUNTER — Ambulatory Visit (INDEPENDENT_AMBULATORY_CARE_PROVIDER_SITE_OTHER)
Admission: RE | Admit: 2014-05-04 | Discharge: 2014-05-04 | Disposition: A | Discharge status: Home / Self Care | Payer: Medicare Other | Source: Ambulatory Visit | Attending: Family Medicine | Admitting: Family Medicine

## 2014-05-04 VITALS — BP 120/78 | HR 66 | Ht 62.0 in | Wt 140.0 lb

## 2014-05-04 DIAGNOSIS — M67919 Unspecified disorder of synovium and tendon, unspecified shoulder: Secondary | ICD-10-CM

## 2014-05-04 DIAGNOSIS — M25519 Pain in unspecified shoulder: Secondary | ICD-10-CM

## 2014-05-04 DIAGNOSIS — M25511 Pain in right shoulder: Secondary | ICD-10-CM

## 2014-05-04 DIAGNOSIS — M542 Cervicalgia: Secondary | ICD-10-CM

## 2014-05-04 DIAGNOSIS — M719 Bursopathy, unspecified: Secondary | ICD-10-CM

## 2014-05-04 DIAGNOSIS — M7551 Bursitis of right shoulder: Secondary | ICD-10-CM | POA: Insufficient documentation

## 2014-05-04 NOTE — Assessment & Plan Note (Signed)
Patient did respond fairly well to the injection today. Patient had near complete resolution of pain. I do not think there is any cervical radiculopathy but we'll keep that in the differential. Discussed icing protocol. Discussed continuing the anti-inflammatories on an as needed basis. We discussed over-the-counter medications it can help with her alternative changes as well. Patient is going to come back and see me again for her chronic low back pain in the near future. Patient will also come back in 3 weeks for her for evaluation and followup.

## 2014-05-04 NOTE — Progress Notes (Signed)
[facility] [address] [place], [place] [address] Phone: [phone] Subjective:    I'm seeing this patient by the request  of:  Gwendolyn Grant, MD Linna Darner M.D.  CC: Pain in the shoulder, bilateral  [id number] [name] is a 73 y.o. female coming in with complaint of bilateral shoulder pain right greater than left. Patient states that certain activities seem to make it worse. Patient states that she has severe intensity of pain with certain movements or 9/10 in severity. Patient has taken some over-the-counter medications with minimal response. Patient states if she extends her right arm above her head it causes severe pain. States that she feels that the range of motion is decreased with some weakness on this side as well. Patient does have a past medical history significant for back surgery in 2004 as well as a past medical history significant for fibromyalgia. Patient did have labs to rule out polymyalgia rheumatica when she was seen previously by another provider with a normal sedimentation rate.       Past medical history, social, surgical and family history all reviewed in electronic medical record.   Review of Systems: No headache, visual changes, nausea, vomiting, diarrhea, constipation, dizziness, abdominal pain, skin rash, fevers, chills, night sweats, weight loss, swollen lymph nodes, body aches, joint swelling, muscle aches, chest pain, shortness of breath, mood changes.   Objective Blood pressure 120/78, pulse 66, height 5\' 2"  (1.575 m), weight 140 lb (63.504 kg), SpO2 98.00%.  General: No apparent distress alert and oriented x3 mood and affect normal, dressed appropriately.  HEENT: Pupils equal, extraocular movements intact  Respiratory: Patient's speak in full sentences and does not appear short of breath  Cardiovascular: No lower extremity edema, non tender, no erythema  Skin: Warm dry intact with no signs of infection or rash on  extremities or on axial skeleton.  Abdomen: Soft nontender  Neuro: Cranial nerves II through XII are intact, neurovascularly intact in all extremities with 2+ DTRs and 2+ pulses.  Lymph: No lymphadenopathy of posterior or anterior cervical chain or axillae bilaterally.  Gait normal with good balance and coordination.  MSK:  Non tender with full range of motion and good stability and symmetric strength and tone of elbows, wrist, hip, knee and ankles bilaterally.  Shoulder: Right Inspection reveals no abnormalities, atrophy or asymmetry. Palpation is normal with no tenderness over AC joint or bicipital groove. ROM is full in all planes passively. Rotator cuff strength normal throughout. signs of impingement with positive Neer and Hawkin's tests, but negative empty can sign. Speeds and Yergason's tests normal. No labral pathology noted with negative Obrien's, negative clunk and good stability. Normal scapular function observed. No painful arc and no drop arm sign. No apprehension sign Contralateral shoulder unremarkable  MSK US performed of: Right This study was ordered, performed, and interpreted by Charlann Boxer D.O.  Shoulder:   Supraspinatus:  Appears normal on long and transverse views but does have degenerative changes, Bursal bulge seen with shoulder abduction on impingement view. Infraspinatus:  Appears normal on long and transverse views. Significant increase in Doppler flow Subscapularis:  Appears normal on long and transverse views. Positive bursa Teres Minor:  Appears normal on long and transverse views. AC joint:  Capsule undistended, no geyser sign. Glenohumeral Joint:  Appears normal without effusion and mild to moderate osteophytic changes Glenoid Labrum:  Intact without visualized tears. Biceps Tendon:  Appears normal on long and transverse views, no fraying of tendon, tendon located  in intertubercular groove, no subluxation with shoulder internal or external  rotation.  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  Verbal informed consent obtained.  Time-out conducted.  Noted no overlying erythema, induration, or other signs of local infection.  Skin prepped in a sterile fashion.  Local anesthesia: Topical Ethyl chloride.  With sterile technique and under real time ultrasound guidance:  Joint visualized.  23g 1  inch needle inserted posterior approach. Pictures taken for needle placement. Patient did have injection of 2 cc of 1% lidocaine, 2 cc of 0.5% Marcaine, and 1.0 cc of Kenalog 40 mg/dL. Completed without difficulty  Pain immediately resolved suggesting accurate placement of the medication.  Advised to call if fevers/chills, erythema, induration, drainage, or persistent bleeding.  Images permanently stored and available for review in the ultrasound unit.  Impression: Technically successful ultrasound guided injection.    Impression and Recommendations:     This case required medical decision making of moderate complexity.

## 2014-05-04 NOTE — Patient Instructions (Signed)
Good to see you.  Ice 20 minutes 2 times daily. Usually after activity and before bed. Exercises 3 times a week.  Take tylenol 650 mg three times a day is the best evidence based medicine we have for arthritis.  Glucosamine sulfate 750mg  twice a day is a supplement that has been shown to help moderate to severe arthritis. Vitamin D 2000 IU daily Fish oil 2 grams daily.  Tumeric 500mg  twice daily.  Capsaicin topically up to four times a day may also help with pain. Cortisone injections are an option if these interventions do not seem to make a difference or need more relief.  It's important that you continue to stay active.  Water aerobics and cycling with low resistance are the best two types of exercise for arthritis. Come back and see me in 3 weeks.

## 2014-05-05 ENCOUNTER — Other Ambulatory Visit: Payer: Self-pay

## 2014-05-05 DIAGNOSIS — K922 Gastrointestinal hemorrhage, unspecified: Secondary | ICD-10-CM

## 2014-05-05 MED ORDER — AMLODIPINE BESYLATE 5 MG PO TABS: ORAL_TABLET | ORAL | Status: DC

## 2014-05-12 ENCOUNTER — Other Ambulatory Visit: Payer: Self-pay | Admitting: Internal Medicine

## 2014-05-12 NOTE — Telephone Encounter (Signed)
Printed rx already printed and faxed to pharmacy .

## 2014-05-21 ENCOUNTER — Other Ambulatory Visit: Payer: Self-pay | Admitting: Internal Medicine

## 2014-05-24 NOTE — Telephone Encounter (Signed)
Faxed script back to [facility].../lmb 

## 2014-05-27 ENCOUNTER — Ambulatory Visit: Payer: Medicare Other | Admitting: Family Medicine

## 2014-06-04 ENCOUNTER — Other Ambulatory Visit: Payer: Self-pay | Admitting: Internal Medicine

## 2014-06-17 ENCOUNTER — Other Ambulatory Visit: Payer: Self-pay | Admitting: Internal Medicine

## 2014-06-17 NOTE — Telephone Encounter (Signed)
Printed and signed please fax.

## 2014-06-17 NOTE — Telephone Encounter (Signed)
Patient is requesting refill for zolpidem to be sent to [facility] on [address].

## 2014-06-20 ENCOUNTER — Encounter: Payer: Self-pay | Admitting: Family Medicine

## 2014-06-20 ENCOUNTER — Ambulatory Visit (INDEPENDENT_AMBULATORY_CARE_PROVIDER_SITE_OTHER): Payer: Medicare Other | Admitting: Family Medicine

## 2014-06-20 VITALS — BP 104/68 | HR 63 | Wt 141.0 lb

## 2014-06-20 DIAGNOSIS — M5010 Cervical disc disorder with radiculopathy, unspecified cervical region: Secondary | ICD-10-CM

## 2014-06-20 DIAGNOSIS — M542 Cervicalgia: Secondary | ICD-10-CM

## 2014-06-20 DIAGNOSIS — M7551 Bursitis of right shoulder: Secondary | ICD-10-CM

## 2014-06-20 MED ORDER — GABAPENTIN 100 MG PO CAPS: 200.0000 mg | ORAL_CAPSULE | Freq: Every day | ORAL | Status: DC

## 2014-06-20 NOTE — Progress Notes (Signed)
  [facility] [place], [place] [address] Phone: [phone] Subjective:      CC: Right shoulder pain follow up.   [id number] [name] is a 73 y.o. female coming in with complaint of bilateral shoulder pain right greater than left. Patient was seen previously and did have some degenerative changes of the rotator cuff as well as a subacromial bursitis. Patient was given a corticosteroid injection as well as home exercises, icing, and we discussed over-the-counter medications. Patient states she had approximately 10-15% improvement after the injection. Patient states though that now she is having more discomfort even at night and when she is laying down. Patient states that this is associated with radicular symptoms going down her arm.patient is doing the over-the-counter medicines and is intermittently doing the exercises. States that the pain is still worse at night. No new symptoms.    Past medical history, social, surgical and family history all reviewed in electronic medical record.   Review of Systems: No headache, visual changes, nausea, vomiting, diarrhea, constipation, dizziness, abdominal pain, skin rash, fevers, chills, night sweats, weight loss, swollen lymph nodes, body aches, joint swelling, muscle aches, chest pain, shortness of breath, mood changes.   Objective Blood pressure 104/68, pulse 63, weight 141 lb (63.957 kg), SpO2 97 %.  General: No apparent distress alert and oriented x3 mood and affect normal, dressed appropriately.  HEENT: Pupils equal, extraocular movements intact  Respiratory: Patient's speak in full sentences and does not appear short of breath  Cardiovascular: No lower extremity edema, non tender, no erythema  Skin: Warm dry intact with no signs of infection or rash on extremities or on axial skeleton.  Abdomen: Soft nontender  Neuro: Cranial nerves II through XII are intact, neurovascularly intact in all  extremities with 2+ DTRs and 2+ pulses.  Lymph: No lymphadenopathy of posterior or anterior cervical chain or axillae bilaterally.  Gait normal with good balance and coordination.  MSK:  Non tender with full range of motion and good stability and symmetric strength and tone of elbows, wrist, hip, knee and ankles bilaterally.  Neck: Inspection unremarkable. No palpable stepoffs. Positive Spurling's maneuver. Right side.  Full neck range of motion Grip strength and sensation normal in bilateral hands Strength good C4 to T1 distribution No sensory change to C4 to T1 Negative Hoffman sign bilaterally Reflexes normal Shoulder: Right Inspection reveals no abnormalities, atrophy or asymmetry. Palpation is normal with no tenderness over AC joint or bicipital groove. ROM is full in all planes passively. Rotator cuff strength normal throughout. signs of impingement with positive Neer and Hawkin's tests, but negative empty can sign. Speeds and Yergason's tests normal. No labral pathology noted with negative Obrien's, negative clunk and good stability. Normal scapular function observed. No painful arc and no drop arm sign. No apprehension sign No change from previous exam.  Contralateral shoulder unremarkable      Impression and Recommendations:     This case required medical decision making of moderate complexity.

## 2014-06-20 NOTE — Patient Instructions (Addendum)
It is good to see you.  Ice is your friend after activity Do only lifting with your hands within your peripheral vision.  Gabapentin 100mg  at night for the first week then 200mg  nightly thereafter.  Decrease amitriptyline to 25mg  at night for 1 week then discontinue.  Take tylenol 650mg  3 times daily and safe to take with tramadol.  Physical therapy will be calling you Call me in 3 weeks and if not better we will get MRI of neck and shoulder.  Otherwise I will see you in 4-6 weeks.

## 2014-06-20 NOTE — Assessment & Plan Note (Signed)
Patient only had a proximally a 15% improvement. Patient will be sent to formal physical therapy because I do not feel she is being compliant with the home exercises. In addition to this I do think that patient's fibromyalgia as well as radicular symptoms from the cervical neck could be contributing. I am hoping of formal physical therapy will be beneficial. We discussed medicines and it started on gabapentin. Patient will follow up and see me again in 4-6 weeks.

## 2014-06-20 NOTE — Assessment & Plan Note (Signed)
Based on patient's presentation and history of x-ray showed advanced osteophytic changes I am concerned the patient is having more radicular symptoms. Patient also has a past medical history significant for fibromyalgia which make it difficult to assess what is giving her pain completely. We discussed continuing the home exercises in the over-the-counter medicine. We discussed with medicines and we can do together. Patient was started on gabapentin to see if this would make any changes and we'll start to decrease patient's amitriptyline at night. In addition to this had like to send patient to formal physical therapy. Patient then is going to call me in 3 weeks if continuing to have pain I would consider an MRI at that time. Patient also notices if any weakness or constant numbness of the upper extremity occurs to call me immediately. Patient and will follow-up with me in 4-6 weeks for further evaluation.  Spent greater than 25 minutes with patient face-to-face and had greater than 50% of counseling including as described above in assessment and plan.

## 2014-06-23 ENCOUNTER — Encounter: Payer: Self-pay | Admitting: Family Medicine

## 2014-06-23 NOTE — Telephone Encounter (Signed)
Discussed with pt over the phone

## 2014-06-24 ENCOUNTER — Other Ambulatory Visit: Payer: Self-pay | Admitting: Internal Medicine

## 2014-06-24 NOTE — Telephone Encounter (Signed)
Pharmacist left msg stating they waiting on refill for zolpidem electronic request was sent 06/17/14. Called pharmacy back spoke with Shanon Brow inform him rx was fax back on 06/17/14. He stated they never received. Gave authorization from 06/17/14...[name]

## 2014-06-28 ENCOUNTER — Ambulatory Visit: Payer: Medicare Other | Attending: Physical Therapy | Admitting: Physical Therapy

## 2014-06-28 DIAGNOSIS — I10 Essential (primary) hypertension: Secondary | ICD-10-CM | POA: Insufficient documentation

## 2014-06-28 DIAGNOSIS — M810 Age-related osteoporosis without current pathological fracture: Secondary | ICD-10-CM | POA: Diagnosis not present

## 2014-06-28 DIAGNOSIS — M7551 Bursitis of right shoulder: Secondary | ICD-10-CM | POA: Diagnosis not present

## 2014-06-28 DIAGNOSIS — M25519 Pain in unspecified shoulder: Secondary | ICD-10-CM | POA: Diagnosis not present

## 2014-06-28 DIAGNOSIS — Z5189 Encounter for other specified aftercare: Secondary | ICD-10-CM | POA: Diagnosis not present

## 2014-06-28 DIAGNOSIS — M542 Cervicalgia: Secondary | ICD-10-CM | POA: Insufficient documentation

## 2014-06-30 ENCOUNTER — Ambulatory Visit: Payer: Medicare Other | Admitting: Physical Therapy

## 2014-07-05 ENCOUNTER — Ambulatory Visit: Payer: Medicare Other

## 2014-07-05 DIAGNOSIS — Z5189 Encounter for other specified aftercare: Secondary | ICD-10-CM | POA: Diagnosis not present

## 2014-07-12 ENCOUNTER — Ambulatory Visit: Payer: Medicare Other | Attending: Family Medicine | Admitting: Physical Therapy

## 2014-07-12 ENCOUNTER — Ambulatory Visit: Payer: Medicare Other | Admitting: Physical Therapy

## 2014-07-12 DIAGNOSIS — M7551 Bursitis of right shoulder: Secondary | ICD-10-CM | POA: Diagnosis not present

## 2014-07-12 DIAGNOSIS — I10 Essential (primary) hypertension: Secondary | ICD-10-CM | POA: Insufficient documentation

## 2014-07-12 DIAGNOSIS — M25519 Pain in unspecified shoulder: Secondary | ICD-10-CM | POA: Insufficient documentation

## 2014-07-12 DIAGNOSIS — M542 Cervicalgia: Secondary | ICD-10-CM | POA: Diagnosis not present

## 2014-07-12 DIAGNOSIS — Z5189 Encounter for other specified aftercare: Secondary | ICD-10-CM | POA: Insufficient documentation

## 2014-07-12 DIAGNOSIS — M810 Age-related osteoporosis without current pathological fracture: Secondary | ICD-10-CM | POA: Diagnosis not present

## 2014-07-13 ENCOUNTER — Other Ambulatory Visit: Payer: Self-pay | Admitting: *Deleted

## 2014-07-13 DIAGNOSIS — M7551 Bursitis of right shoulder: Secondary | ICD-10-CM

## 2014-07-18 ENCOUNTER — Ambulatory Visit (INDEPENDENT_AMBULATORY_CARE_PROVIDER_SITE_OTHER)
Admission: RE | Admit: 2014-07-18 | Discharge: 2014-07-18 | Disposition: A | Discharge status: Home / Self Care | Payer: Medicare Other | Source: Ambulatory Visit | Attending: Family Medicine | Admitting: Family Medicine

## 2014-07-18 DIAGNOSIS — M7551 Bursitis of right shoulder: Secondary | ICD-10-CM

## 2014-07-19 ENCOUNTER — Ambulatory Visit: Payer: Medicare Other | Admitting: Physical Therapy

## 2014-07-21 ENCOUNTER — Ambulatory Visit: Payer: Medicare Other | Admitting: Physical Therapy

## 2014-07-21 ENCOUNTER — Telehealth: Payer: Self-pay | Admitting: Internal Medicine

## 2014-07-21 DIAGNOSIS — Z5189 Encounter for other specified aftercare: Secondary | ICD-10-CM | POA: Diagnosis not present

## 2014-07-21 MED ORDER — OMEPRAZOLE 40 MG PO CPDR: 40.0000 mg | DELAYED_RELEASE_CAPSULE | Freq: Two times a day (BID) | ORAL | Status: DC

## 2014-07-21 MED ORDER — ZOLPIDEM TARTRATE 5 MG PO TABS: 5.0000 mg | ORAL_TABLET | Freq: Every evening | ORAL | Status: DC | PRN

## 2014-07-21 NOTE — Telephone Encounter (Signed)
Printed for md approval.

## 2014-07-21 NOTE — Telephone Encounter (Signed)
Pt called request refill for Omeprazole and Zolpidem to be send to her drug store. Please advise, pt stated drug store is trying to get this refill for 3 days, no reply from our office.

## 2014-07-25 ENCOUNTER — Other Ambulatory Visit: Payer: Self-pay | Admitting: Internal Medicine

## 2014-07-26 ENCOUNTER — Ambulatory Visit: Payer: Medicare Other | Admitting: Physical Therapy

## 2014-08-10 ENCOUNTER — Other Ambulatory Visit: Payer: Self-pay | Admitting: Internal Medicine

## 2014-08-10 NOTE — Telephone Encounter (Signed)
Faxed script back to [facility].../lmb 

## 2014-08-10 NOTE — Telephone Encounter (Signed)
Done hardcopy to robin  

## 2014-08-10 NOTE — Telephone Encounter (Signed)
MD is out of office pls advise on refill.../lmb 

## 2014-09-13 ENCOUNTER — Other Ambulatory Visit: Payer: Self-pay | Admitting: Internal Medicine

## 2014-09-19 ENCOUNTER — Telehealth: Payer: Self-pay | Admitting: Internal Medicine

## 2014-09-19 NOTE — Telephone Encounter (Signed)
Patient called stating her pharmacy has been trying to get [place] to refill tramadol since last week and they have not received anything from [place]. Looks like this was sent on 09/13/14, but per patient, pharmacy does not have it.

## 2014-09-19 NOTE — Telephone Encounter (Signed)
This has been called into the pharmacy.  

## 2014-10-15 ENCOUNTER — Other Ambulatory Visit: Payer: Self-pay | Admitting: Internal Medicine

## 2014-10-17 NOTE — Telephone Encounter (Signed)
Faxed script back to [facility].../lmb 

## 2014-10-17 NOTE — Telephone Encounter (Signed)
Printed - signed  Ok to fax

## 2014-10-31 ENCOUNTER — Other Ambulatory Visit: Payer: Self-pay | Admitting: Internal Medicine

## 2014-11-21 ENCOUNTER — Other Ambulatory Visit: Payer: Self-pay | Admitting: Internal Medicine

## 2014-11-24 ENCOUNTER — Telehealth: Payer: Self-pay | Admitting: *Deleted

## 2014-11-24 NOTE — Telephone Encounter (Signed)
Not need for script, dermatologist already called it in

## 2014-11-24 NOTE — Telephone Encounter (Signed)
Left msg on triage stating she has a cold sore wanting md to send in some valtrex....[name]

## 2014-12-06 ENCOUNTER — Other Ambulatory Visit: Payer: Self-pay | Admitting: Internal Medicine

## 2014-12-12 ENCOUNTER — Other Ambulatory Visit: Payer: Self-pay | Admitting: Internal Medicine

## 2014-12-12 NOTE — Telephone Encounter (Signed)
Pt is not needing refill she actually need a PA on the zolpidem. Just received the PA from pharmacy will contact insurance to start process...[name]

## 2014-12-12 NOTE — Telephone Encounter (Signed)
Ok to call in pended rx refill as above thanks

## 2014-12-12 NOTE — Telephone Encounter (Signed)
Pharmacy needs reason why patient is taking zolpidem (AMBIEN) 5 MG tablet [[id number] in order for the insurance to cover this medication. Pharmacy is [facility] on [address]

## 2014-12-12 NOTE — Telephone Encounter (Signed)
Contacted insurance fax over PA form completed and fax back waiting on approval status...[name]

## 2014-12-14 NOTE — Telephone Encounter (Signed)
Received PA back med was denied it states zolpidem 10 mg is denied for not meeting the quantity limit requirements. Medicare drug coverage only allows you to received # 90 per 365 days. They have mail pt denial letter fax over information back to pharmacy...[name]

## 2014-12-15 ENCOUNTER — Encounter: Payer: Self-pay | Admitting: Internal Medicine

## 2014-12-20 DIAGNOSIS — L568 Other specified acute skin changes due to ultraviolet radiation: Secondary | ICD-10-CM | POA: Diagnosis not present

## 2015-01-05 ENCOUNTER — Encounter: Payer: Self-pay | Admitting: Family

## 2015-01-05 ENCOUNTER — Ambulatory Visit (INDEPENDENT_AMBULATORY_CARE_PROVIDER_SITE_OTHER): Payer: Medicare Other | Admitting: Family

## 2015-01-05 VITALS — BP 130/72 | HR 61 | Temp 98.0°F | Resp 18 | Ht 62.0 in | Wt 138.8 lb

## 2015-01-05 DIAGNOSIS — M797 Fibromyalgia: Secondary | ICD-10-CM | POA: Diagnosis not present

## 2015-01-05 DIAGNOSIS — G4700 Insomnia, unspecified: Secondary | ICD-10-CM | POA: Diagnosis not present

## 2015-01-05 MED ORDER — GABAPENTIN 300 MG PO CAPS
300.0000 mg | ORAL_CAPSULE | Freq: Three times a day (TID) | ORAL | Status: DC
Start: 2015-01-05 — End: 2015-01-31

## 2015-01-05 NOTE — Assessment & Plan Note (Signed)
Continues to experience 8 out of 10 pain at night with current regimen of tramadol and amitriptyline. Discussed risks and benefits of additional neuropathic medications. Start Neurontin 3 times a day. Continue previously prescribed dosages of amitriptyline and tramadol this time. Follow-up in one month to determine effectiveness.

## 2015-01-05 NOTE — Progress Notes (Signed)
Pre visit review using our clinic review tool, if applicable. No additional management support is needed unless otherwise documented below in the visit note. 

## 2015-01-05 NOTE — Assessment & Plan Note (Signed)
Continues to experience insomnia and difficulty swallowing sleep. Averaging approximately 4-5 hours of sleep per night secondary to pain. Insurance no longer covers Ambien. Expresses concern regarding sleep medications. Risks and benefits discussed and will continue conservative treatment with pain management this time starting Neurontin. Continue good sleep hygiene habits. Follow-up if symptoms worsen or fail to improve.

## 2015-01-05 NOTE — Progress Notes (Signed)
Subjective:    Patient ID: [name], female    DOB: 1940-12-24, 74 y.o.   MRN: [id number]  Chief Complaint  Patient presents with  . Medication Questions    having trouble sleeping, has been off ambien bc it is not covered by insurance, has alot of pain due to fibromyalgia, arthritis, also wants to see about trying lyrica for her pain bc the tramadol doesn't seem to help much anymore    HPI:  [name] is a 74 y.o. female with a PMH of hypertension, fibromyalgia, cervical disc disorder, general anxiety disorder, and insomnia who presents today for an office follow-up.  1.) Insomnia - Indicates she continues to experience difficulty sleeping and was previously maintained on Ambien which is no longer covered by her insurance. She is currently taking amitryptiline and tramadol at night. Currently averaging about 4-5 hour of uninterrupted sleep. The pain is what is causing her to stay awake at night.   2.) Fibromyalgia - Pain is currently managed with Tramadol and indicates the that it does not seem to help very much any more.  She was also previously prescribed gabapentin which she is no longer taking. Pain is currently described as achy and burning with a severity of 8-9/10 at night. The severity of the pain is enough to keep her up at night and the timing is worse at night as well.    Allergies  Allergen Reactions  . Morphine And Related Itching and Rash    Given in IV.    Current Outpatient Prescriptions on File Prior to Visit  Medication Sig Dispense Refill  . amitriptyline (ELAVIL) 50 MG tablet Take 1 tablet (50 mg total) by mouth at bedtime. 30 tablet 12  . amLODipine (NORVASC) 5 MG tablet TAKE 1 TABLET BY MOUTH EVERY DAY 90 tablet 0  . aspirin 325 MG tablet Take 325 mg by mouth daily.    . calcium-vitamin D (OSCAL WITH D) 500-200 MG-UNIT per tablet Take 1 tablet by mouth daily.    . cholecalciferol (VITAMIN D) 1000 UNITS tablet Take 1,000 Units by mouth daily.    .  furosemide (LASIX) 20 MG tablet TAKE 1 TABLET BY MOUTH DAILY 90 tablet 3  . meloxicam (MOBIC) 7.5 MG tablet 1 bid prn pain 20 tablet 0  . metoprolol succinate (TOPROL-XL) 50 MG 24 hr tablet TAKE 1 TABLET BY MOUTH ONCE DAILY WITH OR IMMEDIATELY FOLLOWING A MEAL 90 tablet 3  . Multiple Vitamins-Minerals (MULTIVITAMIN WITH MINERALS) tablet Take 1 tablet by mouth daily.    Marland Kitchen omeprazole (PRILOSEC) 40 MG capsule Take 1 capsule (40 mg total) by mouth 2 (two) times daily. 60 capsule 5  . OVER THE COUNTER MEDICATION Tumeric 1 capsule twice daily.    . polyethylene glycol powder (GLYCOLAX/MIRALAX) powder MIX 17 GRAMS INTO THE LIQUID AND DRINK EVERY DAY 510 g 0  . sucralfate (CARAFATE) 1 G tablet Take as crushed tablet in water (suspension) before meals and bedtime 120 tablet 3  . traMADol (ULTRAM) 50 MG tablet Take 1-2 tablets (50-100 mg total) by mouth 3 (three) times daily. 100 tablet 5   No current facility-administered medications on file prior to visit.    Review of Systems  Constitutional: Negative for fever and chills.  Musculoskeletal: Positive for arthralgias.  Neurological: Negative for weakness.  Psychiatric/Behavioral: Positive for sleep disturbance.      Objective:    BP 130/72 mmHg  Pulse 61  Temp(Src) 98 F (36.7 C) (Oral)  Resp 18  Ht 5\' 2"  (1.575 m)  Wt 138 lb 12.8 oz (62.959 kg)  BMI 25.38 kg/m2  SpO2 97% Nursing note and vital signs reviewed.  Physical Exam  Constitutional: She is oriented to person, place, and time. She appears well-developed and well-nourished. No distress.  Cardiovascular: Normal rate, regular rhythm, normal heart sounds and intact distal pulses.   Pulmonary/Chest: Effort normal and breath sounds normal.  Neurological: She is alert and oriented to person, place, and time.  Skin: Skin is warm and dry.  Psychiatric: She has a normal mood and affect. Her behavior is normal. Judgment and thought content normal.       Assessment & Plan:   Problem  List Items Addressed This Visit      Musculoskeletal and Integument   Fibromyalgia - Primary    Continues to experience 8 out of 10 pain at night with current regimen of tramadol and amitriptyline. Discussed risks and benefits of additional neuropathic medications. Start Neurontin 3 times a day. Continue previously prescribed dosages of amitriptyline and tramadol this time. Follow-up in one month to determine effectiveness.      Relevant Medications   gabapentin (NEURONTIN) 300 MG capsule     Other   Insomnia    Continues to experience insomnia and difficulty swallowing sleep. Averaging approximately 4-5 hours of sleep per night secondary to pain. Insurance no longer covers Ambien. Expresses concern regarding sleep medications. Risks and benefits discussed and will continue conservative treatment with pain management this time starting Neurontin. Continue good sleep hygiene habits. Follow-up if symptoms worsen or fail to improve.

## 2015-01-05 NOTE — Patient Instructions (Signed)
Thank you for choosing [facility].  Summary/Instructions:  Your prescription(s) have been submitted to your pharmacy or been printed and provided for you. Please take as directed and contact our office if you believe you are having problem(s) with the medication(s) or have any questions.  If your symptoms worsen or fail to improve, please contact our office for further instruction, or in case of emergency go directly to the emergency room at the closest medical facility.   Gabapentin capsules or tablets What is this medicine? GABAPENTIN (GA ba pen tin) is used to control partial seizures in adults with epilepsy. It is also used to treat certain types of nerve pain. This medicine may be used for other purposes; ask your health care provider or pharmacist if you have questions. COMMON BRAND NAME(S): Orpha Bur, Neurontin What should I tell my health care provider before I take this medicine? They need to know if you have any of these conditions: -kidney disease -suicidal thoughts, plans, or attempt; a previous suicide attempt by you or a family member -an unusual or allergic reaction to gabapentin, other medicines, foods, dyes, or preservatives -pregnant or trying to get pregnant -breast-feeding How should I use this medicine? Take this medicine by mouth with a glass of water. Follow the directions on the prescription label. You can take it with or without food. If it upsets your stomach, take it with food.Take your medicine at regular intervals. Do not take it more often than directed. Do not stop taking except on your doctor's advice. If you are directed to break the 600 or 800 mg tablets in half as part of your dose, the extra half tablet should be used for the next dose. If you have not used the extra half tablet within 28 days, it should be thrown away. A special MedGuide will be given to you by the pharmacist with each prescription and refill. Be sure to read this information  carefully each time. Talk to your pediatrician regarding the use of this medicine in children. Special care may be needed. Overdosage: If you think you have taken too much of this medicine contact a poison control center or emergency room at once. NOTE: This medicine is only for you. Do not share this medicine with others. What if I miss a dose? If you miss a dose, take it as soon as you can. If it is almost time for your next dose, take only that dose. Do not take double or extra doses. What may interact with this medicine? Do not take this medicine with any of the following medications: -other gabapentin products This medicine may also interact with the following medications: -alcohol -antacids -antihistamines for allergy, cough and cold -certain medicines for anxiety or sleep -certain medicines for depression or psychotic disturbances -homatropine; hydrocodone -naproxen -narcotic medicines (opiates) for pain -phenothiazines like chlorpromazine, mesoridazine, prochlorperazine, thioridazine This list may not describe all possible interactions. Give your health care provider a list of all the medicines, herbs, non-prescription drugs, or dietary supplements you use. Also tell them if you smoke, drink alcohol, or use illegal drugs. Some items may interact with your medicine. What should I watch for while using this medicine? Visit your doctor or health care professional for regular checks on your progress. You may want to keep a record at home of how you feel your condition is responding to treatment. You may want to share this information with your doctor or health care professional at each visit. You should contact your doctor or health  care professional if your seizures get worse or if you have any new types of seizures. Do not stop taking this medicine or any of your seizure medicines unless instructed by your doctor or health care professional. Stopping your medicine suddenly can increase  your seizures or their severity. Wear a medical identification bracelet or chain if you are taking this medicine for seizures, and carry a card that lists all your medications. You may get drowsy, dizzy, or have blurred vision. Do not drive, use machinery, or do anything that needs mental alertness until you know how this medicine affects you. To reduce dizzy or fainting spells, do not sit or stand up quickly, especially if you are an older patient. Alcohol can increase drowsiness and dizziness. Avoid alcoholic drinks. Your mouth may get dry. Chewing sugarless gum or sucking hard candy, and drinking plenty of water will help. The use of this medicine may increase the chance of suicidal thoughts or actions. Pay special attention to how you are responding while on this medicine. Any worsening of mood, or thoughts of suicide or dying should be reported to your health care professional right away. Women who become pregnant while using this medicine may enroll in the [place] Pregnancy Registry by calling [phone]. This registry collects information about the safety of antiepileptic drug use during pregnancy. What side effects may I notice from receiving this medicine? Side effects that you should report to your doctor or health care professional as soon as possible: -allergic reactions like skin rash, itching or hives, swelling of the face, lips, or tongue -worsening of mood, thoughts or actions of suicide or dying Side effects that usually do not require medical attention (report to your doctor or health care professional if they continue or are bothersome): -constipation -difficulty walking or controlling muscle movements -dizziness -nausea -slurred speech -tiredness -tremors -weight gain This list may not describe all possible side effects. Call your doctor for medical advice about side effects. You may report side effects to FDA at 1-800-FDA-1088. Where should I  keep my medicine? Keep out of reach of children. Store at room temperature between 15 and 30 degrees C (59 and 86 degrees F). Throw away any unused medicine after the expiration date. NOTE: This sheet is a summary. It may not cover all possible information. If you have questions about this medicine, talk to your doctor, pharmacist, or health care provider.  2015, [employer]. (2013-04-01 09:12:48)

## 2015-01-11 ENCOUNTER — Telehealth: Payer: Self-pay | Admitting: Geriatric Medicine

## 2015-01-11 NOTE — Telephone Encounter (Signed)
Left message for patient to call back to let me know if she has had a mammogram recently.

## 2015-01-12 ENCOUNTER — Other Ambulatory Visit: Payer: Self-pay | Admitting: Internal Medicine

## 2015-01-24 ENCOUNTER — Other Ambulatory Visit: Payer: Self-pay | Admitting: Internal Medicine

## 2015-01-31 ENCOUNTER — Other Ambulatory Visit: Payer: Self-pay | Admitting: Family

## 2015-02-05 IMAGING — CR DG CHEST 2V
2 series · 2 of 2 positions shown · non-contrast
Comparison: Chest x-ray of 06/17/2012

CLINICAL DATA: History of partial nephrectomy for reportedly a
benign process.

CHEST - 2 VIEW

[w chest pa]
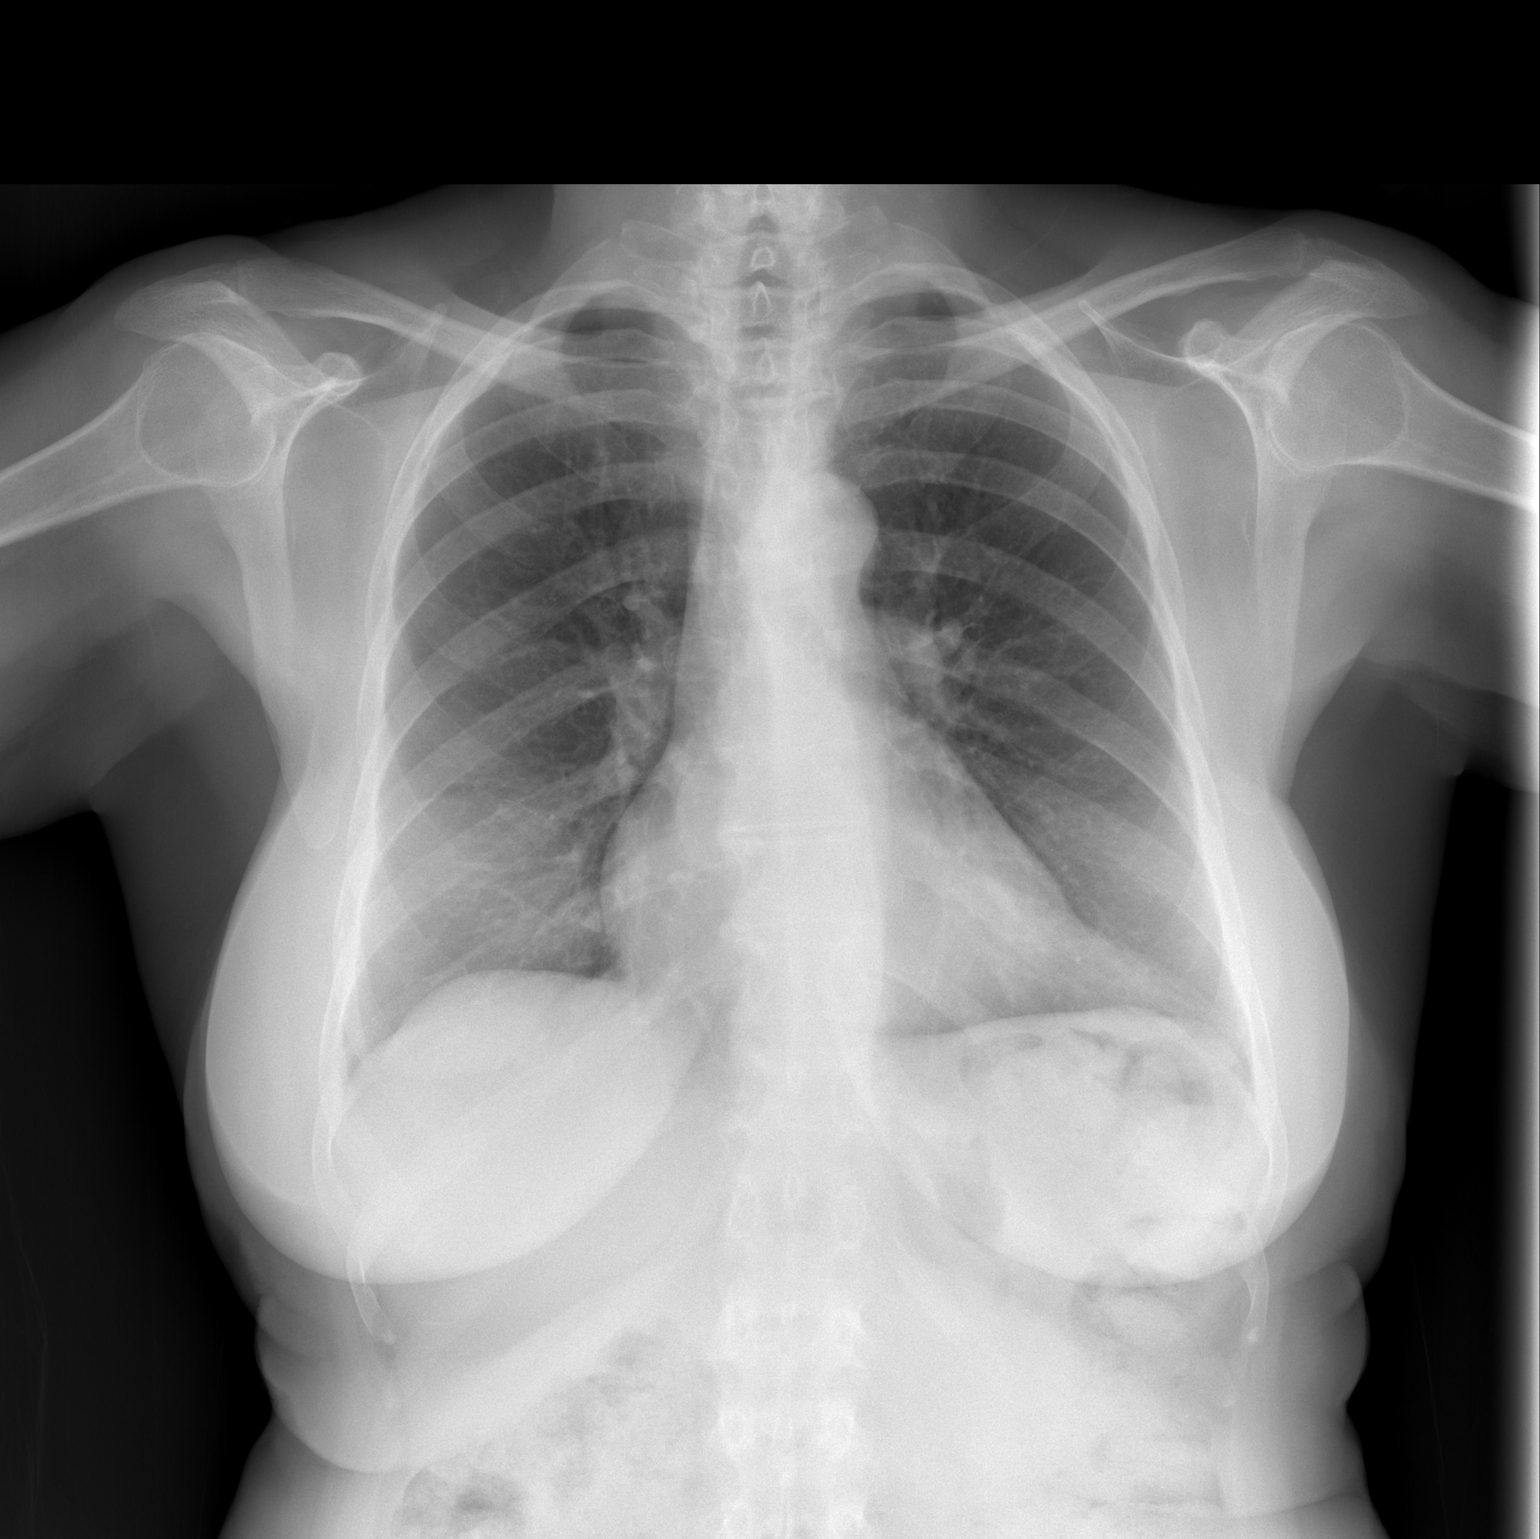

[w chest lat]
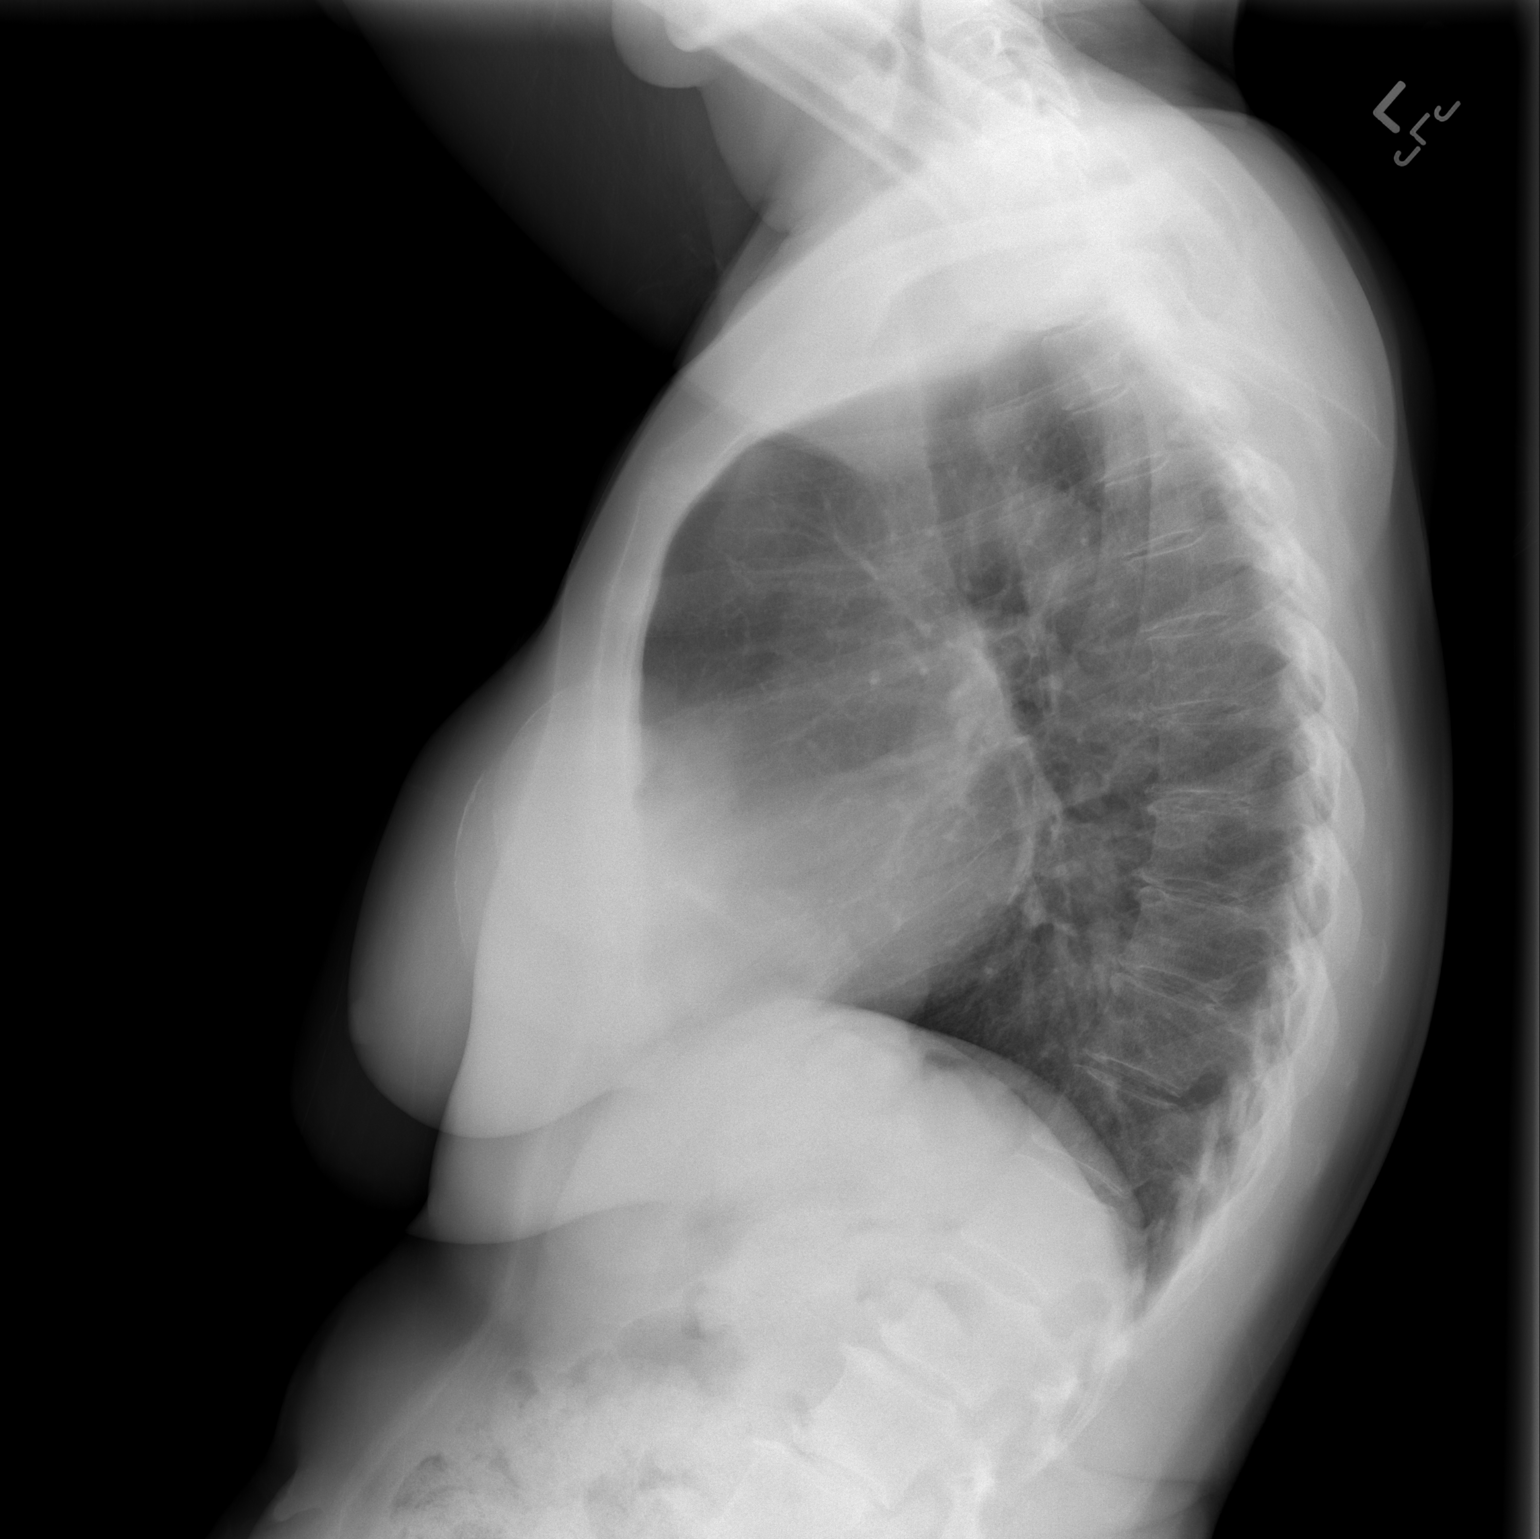

[2 of 2 positions shown; findings below may reference images not displayed]

FINDINGS: No active infiltrate or effusion is seen.  Mediastinal
contours are stable.  The heart is mildly enlarged and stable.  No
acute bony abnormalities seen.  Partially calcified breast implants
are noted.
IMPRESSION: Stable chest x-ray.  No active lung disease.

## 2015-02-08 ENCOUNTER — Other Ambulatory Visit (HOSPITAL_COMMUNITY): Payer: Self-pay | Admitting: Urology

## 2015-02-08 ENCOUNTER — Ambulatory Visit (HOSPITAL_COMMUNITY)
Admission: RE | Admit: 2015-02-08 | Discharge: 2015-02-08 | Disposition: A | Discharge status: Home / Self Care | Payer: Medicare Other | Source: Ambulatory Visit | Attending: Urology | Admitting: Urology

## 2015-02-08 DIAGNOSIS — Z85528 Personal history of other malignant neoplasm of kidney: Secondary | ICD-10-CM

## 2015-03-03 ENCOUNTER — Other Ambulatory Visit: Payer: Self-pay | Admitting: Internal Medicine

## 2015-03-16 ENCOUNTER — Other Ambulatory Visit: Payer: Self-pay | Admitting: Internal Medicine

## 2015-03-25 ENCOUNTER — Other Ambulatory Visit: Payer: Self-pay | Admitting: Internal Medicine

## 2015-04-11 ENCOUNTER — Telehealth: Payer: Self-pay

## 2015-04-11 NOTE — Telephone Encounter (Signed)
Call to the patient to educate on AWV; The patient stated she did not want an AWV in her home. Educated on AWV and completion of screens; preventive care and setting goals and the patient declined for this year.

## 2015-04-13 ENCOUNTER — Encounter: Payer: Self-pay | Admitting: Internal Medicine

## 2015-04-13 ENCOUNTER — Ambulatory Visit (INDEPENDENT_AMBULATORY_CARE_PROVIDER_SITE_OTHER): Payer: Medicare Other | Admitting: Internal Medicine

## 2015-04-13 VITALS — BP 114/74 | HR 60 | Temp 97.9°F | Resp 16 | Wt 142.0 lb

## 2015-04-13 DIAGNOSIS — M797 Fibromyalgia: Secondary | ICD-10-CM | POA: Diagnosis not present

## 2015-04-13 DIAGNOSIS — I10 Essential (primary) hypertension: Secondary | ICD-10-CM

## 2015-04-13 DIAGNOSIS — G8929 Other chronic pain: Secondary | ICD-10-CM | POA: Diagnosis not present

## 2015-04-13 DIAGNOSIS — M545 Low back pain, unspecified: Secondary | ICD-10-CM | POA: Insufficient documentation

## 2015-04-13 MED ORDER — TRAMADOL HCL 50 MG PO TABS: ORAL_TABLET | ORAL | Status: DC

## 2015-04-13 MED ORDER — AMITRIPTYLINE HCL 25 MG PO TABS: 25.0000 mg | ORAL_TABLET | Freq: Every day | ORAL | Status: DC

## 2015-04-13 MED ORDER — AMITRIPTYLINE HCL 50 MG PO TABS: ORAL_TABLET | ORAL | Status: DC

## 2015-04-13 NOTE — Progress Notes (Signed)
Pre visit review using our clinic review tool, if applicable. No additional management support is needed unless otherwise documented below in the visit note. 

## 2015-04-13 NOTE — Patient Instructions (Signed)
  Please review the warnings concerning the combined therapy with tramadol and amitriptyline. These medications should be taken at the lowest possible doses & infrequently as possible because of possible adverse drug: Drug interaction.  Minimal Blood Pressure Goal= AVERAGE < 140/90;  Ideal is an AVERAGE < 135/85. This AVERAGE should be calculated from @ least 5-7 BP readings taken @ different times of day on different days of week. You should not respond to isolated BP readings , but rather the AVERAGE for that week .Please bring your  blood pressure cuff to office visits to verify that it is reliable.It  can also be checked against the blood pressure device at the pharmacy. Finger or wrist cuffs are not dependable; an arm cuff is.

## 2015-04-13 NOTE — Progress Notes (Signed)
   Subjective:    Patient ID: [name], female    DOB: 1941/03/15, 74 y.o.   MRN: [id number]  HPI The patient is here to assess status of active health conditions.  PMH, FH, & Social History reviewed & updated.  She is on a heart healthy diet. She walks 3 times a week for 30-40 minutes without associated cardiopulmonary symptoms.  Colonoscopy is up-to-date; she has no active GI symptoms.  She has a history of fibromyalgia for which she was seen at [facility] in the late 90s. She's been taking amitriptyline 50 mg @ bedtime and 2 tramadol.She also takes 1 tramadol in the morning. she previously had been on meloxicam but she's discontinued this at the advice of her GI specialist who was concerned about ulcers.  She had a partial nephrectomy in 2013.  Review of systems is essentially negative otherwise except for occasional tingling in her right lower extremity.    Review of Systems  Chest pain, palpitations, tachycardia, exertional dyspnea, paroxysmal nocturnal dyspnea, claudication or edema are absent. No unexplained weight loss, abdominal pain, significant dyspepsia, dysphagia, melena, rectal bleeding, or persistently small caliber stools. Dysuria, pyuria, hematuria, frequency, nocturia or polyuria are denied. Change in hair, skin, nails denied. No bowel changes of constipation or diarrhea. No intolerance to heat or cold. She denies numbness or weakness in the lower extremities. She has no incontinence of urine or stool.      Objective:   Physical Exam  Pertinent or positive findings include: She has an upper dental plate. There is decreased range of motion of the cervical spine. 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 She has crepitus of the knees.  General appearance :adequately nourished; in no distress.  Eyes: No conjunctival inflammation or scleral icterus is present.  Oral exam:  Lips and gums are healthy appearing.There is no oropharyngeal  erythema or exudate noted.   Heart:  Normal rate and regular rhythm. S1 and S2 normal without gallop, murmur, click, rub or other extra sounds    Lungs:Chest clear to auscultation; no wheezes, rhonchi,rales ,or rubs present.No increased work of breathing.   Abdomen: bowel sounds normal, soft and non-tender without masses, organomegaly or hernias noted.  No guarding or rebound.   Vascular : all pulses equal ; no bruits present.  Skin:Warm & dry.  Intact without suspicious lesions or rashes ; no tenting or jaundice   Lymphatic: No lymphadenopathy is noted about the head, neck, axilla.   Neuro: Strength, tone & DTRs normal.         Assessment & Plan:  See Current Assessment & Plan in Problem List under specific Diagnosis.  I discussed the risk of polypharmacy in the context of her age and the partial nephrectomy. It was recommended that she decrease the dose and frequency of the tramadol and amitriptyline as much as possible because of seizure risks. She was given copy of [facility] precautions & recommendations .

## 2015-04-27 ENCOUNTER — Encounter: Payer: Self-pay | Admitting: Internal Medicine

## 2015-04-27 ENCOUNTER — Other Ambulatory Visit (INDEPENDENT_AMBULATORY_CARE_PROVIDER_SITE_OTHER): Payer: Medicare Other

## 2015-04-27 ENCOUNTER — Ambulatory Visit (INDEPENDENT_AMBULATORY_CARE_PROVIDER_SITE_OTHER): Payer: Medicare Other | Admitting: Internal Medicine

## 2015-04-27 VITALS — BP 128/80 | HR 68 | Temp 98.4°F | Resp 12 | Ht 62.0 in | Wt 139.4 lb

## 2015-04-27 DIAGNOSIS — I10 Essential (primary) hypertension: Secondary | ICD-10-CM

## 2015-04-27 DIAGNOSIS — Z23 Encounter for immunization: Secondary | ICD-10-CM | POA: Diagnosis not present

## 2015-04-27 DIAGNOSIS — Z0000 Encounter for general adult medical examination without abnormal findings: Secondary | ICD-10-CM

## 2015-04-27 DIAGNOSIS — G4700 Insomnia, unspecified: Secondary | ICD-10-CM | POA: Diagnosis not present

## 2015-04-27 LAB — COMPREHENSIVE METABOLIC PANEL
ALT: 24 U/L (ref 0–35)
AST: 30 U/L (ref 0–37)
Albumin: 4.7 g/dL (ref 3.5–5.2)
Alkaline Phosphatase: 69 U/L (ref 39–117)
BUN: 15 mg/dL (ref 6–23)
CO2: 31 mEq/L (ref 19–32)
Calcium: 10 mg/dL (ref 8.4–10.5)
Chloride: 100 mEq/L (ref 96–112)
Creatinine, Ser: 0.84 mg/dL (ref 0.40–1.20)
GFR: 70.41 mL/min (ref >60.00)
Glucose, Bld: 109 mg/dL — ABNORMAL HIGH (ref 70–99)
Potassium: 4.3 mEq/L (ref 3.5–5.1)
Sodium: 137 mEq/L (ref 135–145)
Total Bilirubin: 0.3 mg/dL (ref 0.2–1.2)
Total Protein: 7.8 g/dL (ref 6.0–8.3)

## 2015-04-27 LAB — CBC
HCT: 39.7 % (ref 36.0–46.0)
Hemoglobin: 13.2 g/dL (ref 12.0–15.0)
MCHC: 33.2 g/dL (ref 30.0–36.0)
MCV: 92 fl (ref 78.0–100.0)
Platelets: 300 10*3/uL (ref 150.0–400.0)
RBC: 4.32 Mil/uL (ref 3.87–5.11)
RDW: 13.3 % (ref 11.5–15.5)
WBC: 7 10*3/uL (ref 4.0–10.5)

## 2015-04-27 LAB — LIPID PANEL
Cholesterol: 191 mg/dL (ref 0–200)
HDL: 64.3 mg/dL (ref >39.00)
LDL Cholesterol: 105 mg/dL — ABNORMAL HIGH (ref 0–99)
NonHDL: 126.71
Total CHOL/HDL Ratio: 3
Triglycerides: 109 mg/dL (ref 0.0–149.0)
VLDL: 21.8 mg/dL (ref 0.0–40.0)

## 2015-04-27 NOTE — Patient Instructions (Signed)
We will check the blood work today and call you back with the results.   We have given you the pneumonia booster shot and the flu shot today.   Come back in about 6  Months for a check up and if you have any problems or questions call our office sooner.   Health Maintenance Adopting a healthy lifestyle and getting preventive care can go a long way to promote health and wellness. Talk with your health care provider about what schedule of regular examinations is right for you. This is a good chance for you to check in with your provider about disease prevention and staying healthy. In between checkups, there are plenty of things you can do on your own. Experts have done a lot of research about which lifestyle changes and preventive measures are most likely to keep you healthy. Ask your health care provider for more information. WEIGHT AND DIET  Eat a healthy diet  Be sure to include plenty of vegetables, fruits, low-fat dairy products, and lean protein.  Do not eat a lot of foods high in solid fats, added sugars, or salt.  Get regular exercise. This is one of the most important things you can do for your health.  Most adults should exercise for at least 150 minutes each week. The exercise should increase your heart rate and make you sweat (moderate-intensity exercise).  Most adults should also do strengthening exercises at least twice a week. This is in addition to the moderate-intensity exercise.  Maintain a healthy weight  Body mass index (BMI) is a measurement that can be used to identify possible weight problems. It estimates body fat based on height and weight. Your health care provider can help determine your BMI and help you achieve or maintain a healthy weight.  For females 5 years of age and older:   A BMI below 18.5 is considered underweight.  A BMI of 18.5 to 24.9 is normal.  A BMI of 25 to 29.9 is considered overweight.  A BMI of 30 and above is considered obese.   Watch levels of cholesterol and blood lipids  You should start having your blood tested for lipids and cholesterol at 74 years of age, then have this test every 5 years.  You may need to have your cholesterol levels checked more often if:  Your lipid or cholesterol levels are high.  You are older than 74 years of age.  You are at high risk for heart disease.  CANCER SCREENING   Lung Cancer  Lung cancer screening is recommended for adults 19-11 years old who are at high risk for lung cancer because of a history of smoking.  A yearly low-dose CT scan of the lungs is recommended for people who:  Currently smoke.  Have quit within the past 15 years.  Have at least a 30-pack-year history of smoking. A pack year is smoking an average of one pack of cigarettes a day for 1 year.  Yearly screening should continue until it has been 15 years since you quit.  Yearly screening should stop if you develop a health problem that would prevent you from having lung cancer treatment.  Breast Cancer  Practice breast self-awareness. This means understanding how your breasts normally appear and feel.  It also means doing regular breast self-exams. Let your health care provider know about any changes, no matter how small.  If you are in your 20s or 30s, you should have a clinical breast exam (CBE) by a health  care provider every 1-3 years as part of a regular health exam.  If you are 73 or older, have a CBE every year. Also consider having a breast X-ray (mammogram) every year.  If you have a family history of breast cancer, talk to your health care provider about genetic screening.  If you are at high risk for breast cancer, talk to your health care provider about having an MRI and a mammogram every year.  Breast cancer gene (BRCA) assessment is recommended for women who have family members with BRCA-related cancers. BRCA-related cancers  include:  Breast.  Ovarian.  Tubal.  Peritoneal cancers.  Results of the assessment will determine the need for genetic counseling and BRCA1 and BRCA2 testing. Cervical Cancer Routine pelvic examinations to screen for cervical cancer are no longer recommended for nonpregnant women who are considered low risk for cancer of the pelvic organs (ovaries, uterus, and vagina) and who do not have symptoms. A pelvic examination may be necessary if you have symptoms including those associated with pelvic infections. Ask your health care provider if a screening pelvic exam is right for you.   The Pap test is the screening test for cervical cancer for women who are considered at risk.  If you had a hysterectomy for a problem that was not cancer or a condition that could lead to cancer, then you no longer need Pap tests.  If you are older than 65 years, and you have had normal Pap tests for the past 10 years, you no longer need to have Pap tests.  If you have had past treatment for cervical cancer or a condition that could lead to cancer, you need Pap tests and screening for cancer for at least 20 years after your treatment.  If you no longer get a Pap test, assess your risk factors if they change (such as having a new sexual partner). This can affect whether you should start being screened again.  Some women have medical problems that increase their chance of getting cervical cancer. If this is the case for you, your health care provider may recommend more frequent screening and Pap tests.  The human papillomavirus (HPV) test is another test that may be used for cervical cancer screening. The HPV test looks for the virus that can cause cell changes in the cervix. The cells collected during the Pap test can be tested for HPV.  The HPV test can be used to screen women 48 years of age and older. Getting tested for HPV can extend the interval between normal Pap tests from three to five years.  An HPV  test also should be used to screen women of any age who have unclear Pap test results.  After 74 years of age, women should have HPV testing as often as Pap tests.  Colorectal Cancer  This type of cancer can be detected and often prevented.  Routine colorectal cancer screening usually begins at 74 years of age and continues through 74 years of age.  Your health care provider may recommend screening at an earlier age if you have risk factors for colon cancer.  Your health care provider may also recommend using home test kits to check for hidden blood in the stool.  A small camera at the end of a tube can be used to examine your colon directly (sigmoidoscopy or colonoscopy). This is done to check for the earliest forms of colorectal cancer.  Routine screening usually begins at age 62.  Direct examination of the  colon should be repeated every 5-10 years through 74 years of age. However, you may need to be screened more often if early forms of precancerous polyps or small growths are found. Skin Cancer  Check your skin from head to toe regularly.  Tell your health care provider about any new moles or changes in moles, especially if there is a change in a mole's shape or color.  Also tell your health care provider if you have a mole that is larger than the size of a pencil eraser.  Always use sunscreen. Apply sunscreen liberally and repeatedly throughout the day.  Protect yourself by wearing long sleeves, pants, a wide-brimmed hat, and sunglasses whenever you are outside. HEART DISEASE, DIABETES, AND HIGH BLOOD PRESSURE   Have your blood pressure checked at least every 1-2 years. High blood pressure causes heart disease and increases the risk of stroke.  If you are between 78 years and 29 years old, ask your health care provider if you should take aspirin to prevent strokes.  Have regular diabetes screenings. This involves taking a blood sample to check your fasting blood sugar  level.  If you are at a normal weight and have a low risk for diabetes, have this test once every three years after 74 years of age.  If you are overweight and have a high risk for diabetes, consider being tested at a younger age or more often. PREVENTING INFECTION  Hepatitis B  If you have a higher risk for hepatitis B, you should be screened for this virus. You are considered at high risk for hepatitis B if:  You were born in a country where hepatitis B is common. Ask your health care provider which countries are considered high risk.  Your parents were born in a high-risk country, and you have not been immunized against hepatitis B (hepatitis B vaccine).  You have HIV or AIDS.  You use needles to inject street drugs.  You live with someone who has hepatitis B.  You have had sex with someone who has hepatitis B.  You get hemodialysis treatment.  You take certain medicines for conditions, including cancer, organ transplantation, and autoimmune conditions. Hepatitis C  Blood testing is recommended for:  Everyone born from [birth year] through 1965.  Anyone with known risk factors for hepatitis C. Sexually transmitted infections (STIs)  You should be screened for sexually transmitted infections (STIs) including gonorrhea and chlamydia if:  You are sexually active and are younger than 74 years of age.  You are older than 74 years of age and your health care provider tells you that you are at risk for this type of infection.  Your sexual activity has changed since you were last screened and you are at an increased risk for chlamydia or gonorrhea. Ask your health care provider if you are at risk.  If you do not have HIV, but are at risk, it may be recommended that you take a prescription medicine daily to prevent HIV infection. This is called pre-exposure prophylaxis (PrEP). You are considered at risk if:  You are sexually active and do not regularly use condoms or know the HIV status  of your partner(s).  You take drugs by injection.  You are sexually active with a partner who has HIV. Talk with your health care provider about whether you are at high risk of being infected with HIV. If you choose to begin PrEP, you should first be tested for HIV. You should then be tested every 3 months  for as long as you are taking PrEP.  PREGNANCY   If you are premenopausal and you may become pregnant, ask your health care provider about preconception counseling.  If you may become pregnant, take 400 to 800 micrograms (mcg) of folic acid every day.  If you want to prevent pregnancy, talk to your health care provider about birth control (contraception). OSTEOPOROSIS AND MENOPAUSE   Osteoporosis is a disease in which the bones lose minerals and strength with aging. This can result in serious bone fractures. Your risk for osteoporosis can be identified using a bone density scan.  If you are 64 years of age or older, or if you are at risk for osteoporosis and fractures, ask your health care provider if you should be screened.  Ask your health care provider whether you should take a calcium or vitamin D supplement to lower your risk for osteoporosis.  Menopause may have certain physical symptoms and risks.  Hormone replacement therapy may reduce some of these symptoms and risks. Talk to your health care provider about whether hormone replacement therapy is right for you.  HOME CARE INSTRUCTIONS   Schedule regular health, dental, and eye exams.  Stay current with your immunizations.   Do not use any tobacco products including cigarettes, chewing tobacco, or electronic cigarettes.  If you are pregnant, do not drink alcohol.  If you are breastfeeding, limit how much and how often you drink alcohol.  Limit alcohol intake to no more than 1 drink per day for nonpregnant women. One drink equals 12 ounces of beer, 5 ounces of wine, or 1 ounces of hard liquor.  Do not use street  drugs.  Do not share needles.  Ask your health care provider for help if you need support or information about quitting drugs.  Tell your health care provider if you often feel depressed.  Tell your health care provider if you have ever been abused or do not feel safe at home. Document Released: 02/11/2011 Document Revised: 12/13/2013 Document Reviewed: 06/30/2013 [facility] Patient Information 2015 [place], [place]. This information is not intended to replace advice given to you by your health care provider. Make sure you discuss any questions you have with your health care provider.

## 2015-04-27 NOTE — Progress Notes (Signed)
Pre visit review using our clinic review tool, if applicable. No additional management support is needed unless otherwise documented below in the visit note. 

## 2015-04-27 NOTE — Progress Notes (Signed)
   Subjective:    Patient ID: [name], female    DOB: 10/04/1940, 74 y.o.   MRN: [id number]  HPI Here for medicare wellness, no new complaints. Please see A/P for status and treatment of chronic medical problems.   Diet: heart healthy  Physical activity: sedentary Depression/mood screen: negative Hearing: intact to whispered voice Visual acuity: grossly normal, performs annual eye exam  ADLs: capable Fall risk: none Home safety: good Cognitive evaluation: intact to orientation, naming, recall and repetition EOL planning: adv directives discussed  I have personally reviewed and have noted 1. The patient's medical and social history - reviewed today no changes 2. Their use of alcohol, tobacco or illicit drugs 3. Their current medications and supplements 4. The patient's functional ability including ADL's, fall risks, home safety risks and hearing or visual impairment. 5. Diet and physical activities 6. Evidence for depression or mood disorders 7. Care team reviewed and updated (available in snapshot)  Review of Systems  Constitutional: Negative for fever, activity change, appetite change and fatigue.  HENT: Negative.   Eyes: Negative.   Respiratory: Negative for cough, chest tightness, shortness of breath and wheezing.   Cardiovascular: Negative for chest pain, palpitations and leg swelling.  Gastrointestinal: Negative for nausea, abdominal pain, diarrhea and abdominal distention.  Musculoskeletal: Positive for arthralgias. Negative for myalgias and back pain.  Skin: Negative.   Neurological: Negative.   Psychiatric/Behavioral: Negative.       Objective:   Physical Exam  Constitutional: She is oriented to person, place, and time. She appears well-developed and well-nourished.  HENT:  Head: Normocephalic and atraumatic.  Eyes: EOM are normal.  Neck: Normal range of motion.  Cardiovascular: Normal rate and regular rhythm.   Pulmonary/Chest: Effort normal and breath  sounds normal.  Abdominal: Soft. Bowel sounds are normal. She exhibits no distension. There is no tenderness. There is no rebound.  Musculoskeletal: She exhibits no edema.  Neurological: She is alert and oriented to person, place, and time.  Skin: Skin is warm and dry.  Psychiatric: She has a normal mood and affect.   Filed Vitals:   04/27/15 1508  BP: 128/80  Pulse: 68  Temp: 98.4 F (36.9 C)  TempSrc: Oral  Resp: 12  Height: 5\' 2"  (1.575 m)  Weight: 139 lb 6.4 oz (63.231 kg)  SpO2: 94%      Assessment & Plan:  Flu shot and prevnar 13 given at visit.

## 2015-04-28 NOTE — Assessment & Plan Note (Signed)
Controlled on lasix, metoprolol, amlodipine. Checking labs today and adjust as needed.

## 2015-04-28 NOTE — Assessment & Plan Note (Signed)
Doing well with elavil.

## 2015-04-28 NOTE — Assessment & Plan Note (Addendum)
Prevnar 13 and flu shot given today to update immunizations. Up to date on most health maintenance including colonoscopy. Due for mammogram and discussed with her. Counseled about skin cancer prevention and screening. 10 year screening recommendations given to her at visit.

## 2015-05-01 ENCOUNTER — Other Ambulatory Visit: Payer: Self-pay | Admitting: Internal Medicine

## 2015-05-08 ENCOUNTER — Other Ambulatory Visit: Payer: Self-pay | Admitting: Internal Medicine

## 2015-05-08 NOTE — Telephone Encounter (Signed)
Spoke with pt to inform that a 90 day supply could not be sent in for Tramadol. Pt and pharmacy stated that was an error with their system.

## 2015-05-30 ENCOUNTER — Other Ambulatory Visit: Payer: Self-pay | Admitting: Internal Medicine

## 2015-05-30 NOTE — Telephone Encounter (Signed)
Last picked up on 05/08/15 so will refuse today and can be filled next week. Will need UDS with pickup.

## 2015-05-31 ENCOUNTER — Telehealth: Payer: Self-pay | Admitting: *Deleted

## 2015-05-31 DIAGNOSIS — M545 Low back pain: Principal | ICD-10-CM

## 2015-05-31 DIAGNOSIS — G8929 Other chronic pain: Secondary | ICD-10-CM

## 2015-05-31 MED ORDER — TRAMADOL HCL 50 MG PO TABS: 50.0000 mg | ORAL_TABLET | Freq: Three times a day (TID) | ORAL | Status: DC | PRN

## 2015-05-31 NOTE — Telephone Encounter (Signed)
Notified pt md ok #10 has been called into [facility]...[name]

## 2015-05-31 NOTE — Telephone Encounter (Signed)
Give her 10

## 2015-05-31 NOTE — Telephone Encounter (Signed)
Receive call pt states that pharmacy states her tramadol script was denied, and need to contact doctor office. Inform pt per Dr. Sharlet Salina she denied request stating refill too soon. Pt states that Dr. Linna Darner decrease her amount and told her to take 1/2-1 tab a day, and change her amitriptyline, and that was not helping her pain. She has fibromyalgia and she been taking 2 tramadol a day, and wanting directions change back as before. I made appt for pt to see Dr. Sharlet Salina for tomorrow. Pt is wanting to see if Dr. Linna Darner can call in 2 pills until she see md tomorrow. She hasn't had any med today. Pls advise...Johny Chess

## 2015-06-01 ENCOUNTER — Ambulatory Visit (INDEPENDENT_AMBULATORY_CARE_PROVIDER_SITE_OTHER): Payer: Medicare Other | Admitting: Internal Medicine

## 2015-06-01 ENCOUNTER — Encounter: Payer: Self-pay | Admitting: Internal Medicine

## 2015-06-01 VITALS — BP 112/70 | HR 57 | Temp 98.3°F | Resp 16 | Ht 62.0 in | Wt 134.0 lb

## 2015-06-01 DIAGNOSIS — M545 Low back pain: Secondary | ICD-10-CM | POA: Diagnosis not present

## 2015-06-01 DIAGNOSIS — G8929 Other chronic pain: Secondary | ICD-10-CM

## 2015-06-01 MED ORDER — TRAMADOL HCL 50 MG PO TABS: 50.0000 mg | ORAL_TABLET | Freq: Three times a day (TID) | ORAL | Status: DC | PRN

## 2015-06-01 NOTE — Progress Notes (Signed)
   Subjective:    Patient ID: [name], female    DOB: 1941-03-11, 74 y.o.   MRN: [id number]  HPI The patient is a 74 YO female coming in for pain management of her chronic low back pain. She was recently decreased to 3 pills per day of the tramadol which is a decrease of 10 pills per month. She was also decreased on her amitriptyline due to potential for interaction. She is having increased pain in her back and generally only takes 3 pills per day but occasionally needs an additional pill before bedtime so she can sleep.   Review of Systems  Constitutional: Negative for fever, activity change, appetite change and fatigue.  HENT: Negative.   Eyes: Negative.   Respiratory: Negative for cough, chest tightness, shortness of breath and wheezing.   Cardiovascular: Negative for chest pain, palpitations and leg swelling.  Gastrointestinal: Negative for nausea, abdominal pain, diarrhea and abdominal distention.  Musculoskeletal: Positive for back pain and arthralgias. Negative for myalgias.  Skin: Negative.   Neurological: Negative.   Psychiatric/Behavioral: Negative.       Objective:   Physical Exam  Constitutional: She is oriented to person, place, and time. She appears well-developed and well-nourished.  HENT:  Head: Normocephalic and atraumatic.  Eyes: EOM are normal.  Neck: Normal range of motion.  Cardiovascular: Normal rate and regular rhythm.   Pulmonary/Chest: Effort normal and breath sounds normal.  Abdominal: Soft. Bowel sounds are normal. She exhibits no distension. There is no tenderness. There is no rebound.  Musculoskeletal: She exhibits no edema.  Neurological: She is alert and oriented to person, place, and time.  Skin: Skin is warm and dry.  Psychiatric: She has a normal mood and affect.   Filed Vitals:   06/01/15 1348  BP: 112/70  Pulse: 57  Temp: 98.3 F (36.8 C)  TempSrc: Oral  Resp: 16  Height: 5\' 2"  (1.575 m)  Weight: 134 lb (60.782 kg)  SpO2: 92%       Assessment & Plan:

## 2015-06-01 NOTE — Progress Notes (Signed)
Pre visit review using our clinic review tool, if applicable. No additional management support is needed unless otherwise documented below in the visit note. 

## 2015-06-01 NOTE — Patient Instructions (Signed)
We have placed the referral to neurosurgery (Dr. Arnoldo Morale) and have sent in the new prescription for the tramadol that you can take 4 times per day.

## 2015-06-02 NOTE — Assessment & Plan Note (Signed)
Ok with increasing back her tramadol to 100 pills per month. Rx given to her today for that. Will needs UDS with next fill. Talked to her about the need to explore other options for her pain as tramadol is not likely to cure her pain or fully take it away. She would like to see Dr. Arnoldo Morale and referral placed today for that as PT or injection may be a better option for her pain.

## 2015-06-13 DIAGNOSIS — M5416 Radiculopathy, lumbar region: Secondary | ICD-10-CM | POA: Insufficient documentation

## 2015-06-27 ENCOUNTER — Telehealth: Payer: Self-pay | Admitting: *Deleted

## 2015-06-27 DIAGNOSIS — G8929 Other chronic pain: Secondary | ICD-10-CM

## 2015-06-27 DIAGNOSIS — M545 Low back pain: Principal | ICD-10-CM

## 2015-06-27 NOTE — Telephone Encounter (Signed)
Left msg on triage stating she is needing a refill on her Tramadol. Requesting md to increase to #120 instead of 100 because she is taking 4 a day. Saw her Neurosurgeon and he is referring her to have PT since she has been having increase pain...Johny Chess

## 2015-06-27 NOTE — Telephone Encounter (Signed)
Notified pt with md response.../lmb 

## 2015-06-27 NOTE — Telephone Encounter (Signed)
Would not increase without visit as we just increased at her last visit. Recommend her to do PT and see if this helps with her pain. Not due for refill and last one was printed with refills so should not need new script.

## 2015-07-07 ENCOUNTER — Other Ambulatory Visit: Payer: Self-pay | Admitting: Internal Medicine

## 2015-07-10 ENCOUNTER — Encounter: Payer: Self-pay | Admitting: Physical Therapy

## 2015-07-10 ENCOUNTER — Ambulatory Visit: Payer: Medicare Other | Attending: Neurosurgery | Admitting: Physical Therapy

## 2015-07-10 DIAGNOSIS — M545 Low back pain, unspecified: Secondary | ICD-10-CM

## 2015-07-10 DIAGNOSIS — M5136 Other intervertebral disc degeneration, lumbar region: Secondary | ICD-10-CM | POA: Insufficient documentation

## 2015-07-10 NOTE — Therapy (Signed)
[address] [place], [place], [address] Phone: [phone]   Fax:  [phone]  Physical Therapy Evaluation  Patient Details  Name: [name] MRN: [id number] Date of Birth: [birth year] Referring Provider: Arnoldo Morale  Encounter Date: 07/10/2015      PT End of Session - 07/10/15 0955    Visit Number 1   Date for PT Re-Evaluation 09/09/15   PT Start Time 0920   PT Stop Time 1015   PT Time Calculation (min) 55 min   Activity Tolerance Patient tolerated treatment well   Behavior During Therapy [facility] for tasks assessed/performed      Past Medical History  Diagnosis Date  . Intention tremor   . PUD (peptic ulcer disease)   . Helicobacter pylori gastritis   . PVD (peripheral vascular disease) ([place])   . Lumbago   . Osteoarthritis of spine   . Fibromyalgia   . Pneumonia     hx of years ago   . GERD (gastroesophageal reflux disease)   . Esophageal stricture     Past Surgical History  Procedure Laterality Date  . Lumbar fusion  03/2003    L4-5  . Tubal ligation  1984  . Tonsillectomy  1958  . Breast enhancement surgery  1974  . Abdominal hysterectomy  1985  . Cataract extraction Bilateral 2014    shapiro  . Laparoscopic partial nephrectomy Left April '13    [facility], Dr. Alinda Money    There were no vitals filed for this visit.  Visit Diagnosis:  Right-sided low back pain without sciatica - Plan: PT plan of care cert/re-cert  DDD (degenerative disc disease), lumbar - Plan: PT plan of care cert/re-cert      Subjective Assessment - 07/10/15 0933    Subjective Pateint reports that she went on a long car ride in August and her back has been in pain since that time.  MRI showed some DDD above the level of fusion, she had a lumbar fusion in 2004 a one level fusion.   Limitations Sitting   Patient Stated Goals have less pain   Currently in Pain? Yes   Pain Score 5    Pain Location Back   Pain  Orientation Lower;Mid   Pain Descriptors / Indicators Aching;Sore   Pain Type Chronic pain   Pain Onset More than a month ago   Pain Frequency Constant   Aggravating Factors  sitting, standing up to 8-9/10   Pain Relieving Factors rest and pain medication (Tramadol) pain is 3/10 at best   Effect of Pain on Daily Activities just limits me with my ADL's            [facility] PT Assessment - 07/10/15 0001    Assessment   Medical Diagnosis lumbar and cervical DDD   Referring Provider Arnoldo Morale   Prior Therapy a few years ago   Precautions   Precautions None   Balance Screen   Has the patient fallen in the past 6 months No   Has the patient had a decrease in activity level because of a fear of falling?  No   Is the patient reluctant to leave their home because of a fear of falling?  No   Home Environment   Additional Comments some housework   Prior Function   Level of Independence Independent   Vocation Retired   Leisure walks 4x/week   Posture/Postural Control   Posture Comments fwd head and rounded shoulders   AROM  Overall AROM Comments Lumbar ROM decreased 50%   Strength   Overall Strength Comments right LE 3+/5, left LE 4-/5   Flexibility   Soft Tissue Assessment /Muscle Length --  tight HS and piriformis mms   Palpation   Palpation comment very tight and is tender in the lumbar paraspinals, some tenderness in the cervical area and in the upper traps   Ambulation/Gait   Gait Comments mild antalgic on the right, reports that she had some pain in the right leg after the car ride.                   [facility] PT Treatment/Exercise - 2015/08/09 0001    Modalities   Modalities Electrical Stimulation;Moist Heat   Moist Heat Therapy   Number Minutes Moist Heat 15 Minutes   Moist Heat Location Lumbar Spine   Electrical Stimulation   Electrical Stimulation Location lumbar   Electrical Stimulation Action IFC   Electrical Stimulation Parameters tolerance   Electrical  Stimulation Goals Pain                     PT Long Term Goals - August 09, 2015 1004    PT LONG TERM GOAL #1   Title independent with HEP   Time 4   Period Weeks   Status New   PT LONG TERM GOAL #2   Title understand proper posture and body mechanics    Time 4   Period Weeks   Status New               Plan - 2015/08/09 1001    Clinical Impression Statement Patient has a history of lumbar fusion one level in 2004.  She went on a long car ride in August and has had low back pain since that time.  MRI showed DDD of the levels above the fusion.   Pt will benefit from skilled therapeutic intervention in order to improve on the following deficits Decreased range of motion;Decreased strength;Pain;Increased muscle spasms;Impaired flexibility;Improper body mechanics   Rehab Potential Good   PT Frequency 2x / week   PT Duration 4 weeks   PT Treatment/Interventions Electrical Stimulation;Moist Heat;Therapeutic exercise;Manual techniques;Therapeutic activities;Patient/family education;Ultrasound   PT Next Visit Plan Patient has a higher copay and feels like she has a good idea of how to continue, she is to hold treatment per her choice for the next two weeks and try the HEP, if she does not return she will be d/c's with goals met   Consulted and Agree with Plan of Care Patient          G-Codes - 2015/08/09 1005    Functional Assessment Tool Used Foto   Functional Limitation Other PT primary   Other PT Primary Current Status (G2542) At least 40 percent but less than 60 percent impaired, limited or restricted   Other PT Primary Goal Status (H0623) At least 20 percent but less than 40 percent impaired, limited or restricted       Problem List Patient Active Problem List   Diagnosis Date Noted  . Chronic low back pain 04/13/2015  . Cervical disc disorder with radiculopathy of cervical region 06/20/2014  . Bursitis of right shoulder 05/04/2014  . Insomnia 04/21/2013  . Routine  health maintenance 01/28/2013  . HTN (hypertension), benign 01/09/2012  . Memory loss or impairment 12/02/2010  . Fibromyalgia 05/04/2007    Sumner Boast., PT August 09, 2015, 10:08 AM  [facility]  [place], [place], [address] Phone: [phone]   Fax:  [phone]  Name: [name] MRN: [id number] Date of Birth: 12-23-1940

## 2015-07-10 NOTE — Patient Instructions (Signed)
Knee-to-Chest: with Neck Flexion Stretch (Supine)   Pull left knee to chest, tucking chin and lifting head. Hold __10__ seconds. Relax. Repeat _10___ times per set. Do _2___ sets per session. Do __2__ sessions per day.  Trunk: Knees to Chest   Lie on firm, flat surface. Keep head and shoulders flat on surface. Tuck hands behind knees and pull to chest. Hold _10___ seconds. Repeat __10__ times. Do _2___ sessions per day. CAUTION: Movement should be gentle and slow.  Caudal Rotation: Hip Roll, Neutral Lordosis - Supine   Lie with knees bent and slightly elevated, feet flat. Tighten stomach, lower knees out to right side, rotating hips and trunk. Keep stomach tight for return. Repeat _10___ times per set. Do __2__ sets per session. Do _2___ sessions per week.  Pelvic Tilt: Anterior - Legs Bent (Supine)   Rotate pelvis up and arch back. Hold ____ seconds. Relax. Repeat ____ times per set. Do ____ sets per session. Do ____ sessions per day.  Piriformis Stretch   Lying on back, pull right knee toward opposite shoulder. Hold __30__ seconds. Repeat _4___ times. Do _2___ sessions per day.  

## 2015-07-21 ENCOUNTER — Other Ambulatory Visit: Payer: Self-pay | Admitting: Internal Medicine

## 2015-09-04 ENCOUNTER — Other Ambulatory Visit: Payer: Self-pay | Admitting: Internal Medicine

## 2015-09-04 NOTE — Telephone Encounter (Signed)
Called and left a message informing patient that there is a prescription at the front for her to come and pick up.

## 2015-09-04 NOTE — Telephone Encounter (Signed)
Needs UDS, must pickup.

## 2015-09-06 ENCOUNTER — Other Ambulatory Visit: Payer: Self-pay | Admitting: Internal Medicine

## 2015-09-06 NOTE — Telephone Encounter (Signed)
Please advise, thanks.

## 2015-10-03 ENCOUNTER — Other Ambulatory Visit: Payer: Self-pay | Admitting: Internal Medicine

## 2015-10-06 ENCOUNTER — Other Ambulatory Visit: Payer: Self-pay | Admitting: Internal Medicine

## 2015-10-06 ENCOUNTER — Encounter: Payer: Self-pay | Admitting: Internal Medicine

## 2015-10-06 NOTE — Telephone Encounter (Signed)
Faxed to pharmacy

## 2015-10-25 ENCOUNTER — Ambulatory Visit (INDEPENDENT_AMBULATORY_CARE_PROVIDER_SITE_OTHER): Payer: Medicare Other | Admitting: Internal Medicine

## 2015-10-25 ENCOUNTER — Other Ambulatory Visit (INDEPENDENT_AMBULATORY_CARE_PROVIDER_SITE_OTHER): Payer: Medicare Other

## 2015-10-25 ENCOUNTER — Encounter: Payer: Self-pay | Admitting: Internal Medicine

## 2015-10-25 VITALS — BP 118/62 | HR 56 | Temp 98.3°F | Resp 16 | Ht 62.0 in | Wt 135.0 lb

## 2015-10-25 DIAGNOSIS — I10 Essential (primary) hypertension: Secondary | ICD-10-CM | POA: Diagnosis not present

## 2015-10-25 LAB — COMPREHENSIVE METABOLIC PANEL
ALT: 15 U/L (ref 0–35)
AST: 20 U/L (ref 0–37)
Albumin: 4.5 g/dL (ref 3.5–5.2)
Alkaline Phosphatase: 71 U/L (ref 39–117)
BUN: 16 mg/dL (ref 6–23)
CO2: 31 mEq/L (ref 19–32)
Calcium: 9.8 mg/dL (ref 8.4–10.5)
Chloride: 101 mEq/L (ref 96–112)
Creatinine, Ser: 0.8 mg/dL (ref 0.40–1.20)
GFR: 74.39 mL/min (ref >60.00)
Glucose, Bld: 72 mg/dL (ref 70–99)
Potassium: 4.4 mEq/L (ref 3.5–5.1)
Sodium: 138 mEq/L (ref 135–145)
Total Bilirubin: 0.3 mg/dL (ref 0.2–1.2)
Total Protein: 7.5 g/dL (ref 6.0–8.3)

## 2015-10-25 LAB — URINALYSIS, ROUTINE W REFLEX MICROSCOPIC
Bilirubin Urine: NEGATIVE
Hgb urine dipstick: NEGATIVE
Ketones, ur: NEGATIVE
Leukocytes, UA: NEGATIVE
Nitrite: NEGATIVE
RBC / HPF: NONE SEEN (ref 0–?)
Specific Gravity, Urine: <=1.005 — AB (ref 1.000–1.030)
Total Protein, Urine: NEGATIVE
Urine Glucose: NEGATIVE
Urobilinogen, UA: 0.2 (ref 0.0–1.0)
WBC, UA: NONE SEEN (ref 0–?)
pH: 6 (ref 5.0–8.0)

## 2015-10-25 LAB — MICROALBUMIN / CREATININE URINE RATIO
Creatinine,U: 14.2 mg/dL
Microalb Creat Ratio: 4.9 mg/g (ref 0.0–30.0)
Microalb, Ur: <0.7 mg/dL (ref 0.0–1.9)

## 2015-10-25 NOTE — Patient Instructions (Signed)
We are going to check the blood and urine tests for the kidneys to make sure that the back pain is not coming from the kidneys.   I apologize for the bad experience with the prescription and that should not happen again anytime soon.

## 2015-10-25 NOTE — Progress Notes (Signed)
Pre visit review using our clinic review tool, if applicable. No additional management support is needed unless otherwise documented below in the visit note. 

## 2015-10-25 NOTE — Progress Notes (Signed)
   Subjective:    Patient ID: [name], female    DOB: 02-13-1941, 75 y.o.   MRN: [id number]  HPI The patient is a 75 YO female coming in for follow up of her blood pressure. 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 She has been taking the medicines regularly and no side effects from the medicines. She denies headaches, chest pains. No SOB. Still having some back pain and has been to physical therapy but not doing the exercises.   Review of Systems  Constitutional: Negative for fever, activity change, appetite change and fatigue.  Respiratory: Negative for cough, chest tightness, shortness of breath and wheezing.   Cardiovascular: Negative for chest pain, palpitations and leg swelling.  Gastrointestinal: Negative for nausea, abdominal pain, diarrhea and abdominal distention.  Musculoskeletal: Positive for back pain and arthralgias. Negative for myalgias.  Skin: Negative.   Neurological: Negative.   Psychiatric/Behavioral: Negative.       Objective:   Physical Exam  Constitutional: She is oriented to person, place, and time. She appears well-developed and well-nourished.  HENT:  Head: Normocephalic and atraumatic.  Eyes: EOM are normal.  Neck: Normal range of motion.  Cardiovascular: Normal rate and regular rhythm.   Pulmonary/Chest: Effort normal and breath sounds normal.  Abdominal: Soft. Bowel sounds are normal. She exhibits no distension. There is no tenderness. There is no rebound.  Musculoskeletal: She exhibits no edema.  Neurological: She is alert and oriented to person, place, and time.  Skin: Skin is warm and dry.  Psychiatric: She has a normal mood and affect.   Filed Vitals:   10/25/15 0929  BP: 118/62  Pulse: 56  Temp: 98.3 F (36.8 C)  TempSrc: Oral  Resp: 16  Height: 5\' 2"  (1.575 m)  Weight: 135 lb (61.236 kg)  SpO2: 91%      Assessment & Plan:

## 2015-10-25 NOTE — Assessment & Plan Note (Signed)
Taking amlodipine and lasix and metoprolol. Checking microalbumin to creatinine ratio and U/A and CMP for signs of protein. She is concerned about the back pain being a sign of her kidney since she has had the cancer in the past. No blood in her urine she has noticed.

## 2015-11-07 ENCOUNTER — Other Ambulatory Visit: Payer: Self-pay | Admitting: Internal Medicine

## 2015-12-05 ENCOUNTER — Telehealth: Payer: Self-pay | Admitting: Internal Medicine

## 2015-12-05 NOTE — Telephone Encounter (Signed)
Pt called in and said that her bp is high due to move and stress, would like to see if Dr Sharlet Salina could give her something for stress.

## 2015-12-07 NOTE — Telephone Encounter (Signed)
Left message informing patient that she will need an office visit.

## 2015-12-07 NOTE — Telephone Encounter (Signed)
I would recommend that with her other medicines she come in for a visit. She can try relaxation strategies or counseling in the meantime.

## 2015-12-24 ENCOUNTER — Emergency Department (HOSPITAL_COMMUNITY): Payer: Medicare Other

## 2015-12-24 ENCOUNTER — Encounter (HOSPITAL_COMMUNITY): Payer: Self-pay | Admitting: *Deleted

## 2015-12-24 ENCOUNTER — Emergency Department (HOSPITAL_COMMUNITY)
Admission: EM | Admit: 2015-12-24 | Discharge: 2015-12-24 | Disposition: A | Discharge status: Home / Self Care | Payer: Medicare Other | Attending: Emergency Medicine | Admitting: Emergency Medicine

## 2015-12-24 DIAGNOSIS — M479 Spondylosis, unspecified: Secondary | ICD-10-CM | POA: Insufficient documentation

## 2015-12-24 DIAGNOSIS — Z8669 Personal history of other diseases of the nervous system and sense organs: Secondary | ICD-10-CM | POA: Insufficient documentation

## 2015-12-24 DIAGNOSIS — Z8619 Personal history of other infectious and parasitic diseases: Secondary | ICD-10-CM | POA: Diagnosis not present

## 2015-12-24 DIAGNOSIS — Z9851 Tubal ligation status: Secondary | ICD-10-CM | POA: Diagnosis not present

## 2015-12-24 DIAGNOSIS — Z859 Personal history of malignant neoplasm, unspecified: Secondary | ICD-10-CM | POA: Diagnosis not present

## 2015-12-24 DIAGNOSIS — K529 Noninfective gastroenteritis and colitis, unspecified: Secondary | ICD-10-CM | POA: Insufficient documentation

## 2015-12-24 DIAGNOSIS — Z8701 Personal history of pneumonia (recurrent): Secondary | ICD-10-CM | POA: Insufficient documentation

## 2015-12-24 DIAGNOSIS — R1010 Upper abdominal pain, unspecified: Secondary | ICD-10-CM | POA: Diagnosis present

## 2015-12-24 DIAGNOSIS — Z79899 Other long term (current) drug therapy: Secondary | ICD-10-CM | POA: Insufficient documentation

## 2015-12-24 DIAGNOSIS — I10 Essential (primary) hypertension: Secondary | ICD-10-CM | POA: Diagnosis not present

## 2015-12-24 DIAGNOSIS — Z90710 Acquired absence of both cervix and uterus: Secondary | ICD-10-CM | POA: Insufficient documentation

## 2015-12-24 DIAGNOSIS — K219 Gastro-esophageal reflux disease without esophagitis: Secondary | ICD-10-CM | POA: Diagnosis not present

## 2015-12-24 DIAGNOSIS — M797 Fibromyalgia: Secondary | ICD-10-CM | POA: Insufficient documentation

## 2015-12-24 HISTORY — DX: Malignant (primary) neoplasm, unspecified: C80.1

## 2015-12-24 HISTORY — DX: Essential (primary) hypertension: I10

## 2015-12-24 LAB — COMPREHENSIVE METABOLIC PANEL
ALT: 24 U/L (ref 14–54)
AST: 26 U/L (ref 15–41)
Albumin: 3.6 g/dL (ref 3.5–5.0)
Alkaline Phosphatase: 76 U/L (ref 38–126)
Anion gap: 11 (ref 5–15)
BUN: 7 mg/dL (ref 6–20)
CO2: 25 mmol/L (ref 22–32)
Calcium: 9.4 mg/dL (ref 8.9–10.3)
Chloride: 103 mmol/L (ref 101–111)
Creatinine, Ser: 0.69 mg/dL (ref 0.44–1.00)
GFR calc Af Amer: >60 mL/min (ref >60)
GFR calc non Af Amer: >60 mL/min (ref >60)
Glucose, Bld: 102 mg/dL — ABNORMAL HIGH (ref 65–99)
Potassium: 4.4 mmol/L (ref 3.5–5.1)
Sodium: 139 mmol/L (ref 135–145)
Total Bilirubin: 0.5 mg/dL (ref 0.3–1.2)
Total Protein: 7.1 g/dL (ref 6.5–8.1)

## 2015-12-24 LAB — URINALYSIS, ROUTINE W REFLEX MICROSCOPIC
Bilirubin Urine: NEGATIVE
Glucose, UA: NEGATIVE mg/dL
Hgb urine dipstick: NEGATIVE
Ketones, ur: NEGATIVE mg/dL
Leukocytes, UA: NEGATIVE
Nitrite: NEGATIVE
Protein, ur: NEGATIVE mg/dL
Specific Gravity, Urine: 1.01 (ref 1.005–1.030)
pH: 6 (ref 5.0–8.0)

## 2015-12-24 LAB — CBC
HCT: 35.2 % — ABNORMAL LOW (ref 36.0–46.0)
Hemoglobin: 11.5 g/dL — ABNORMAL LOW (ref 12.0–15.0)
MCH: 29.9 pg (ref 26.0–34.0)
MCHC: 32.7 g/dL (ref 30.0–36.0)
MCV: 91.7 fL (ref 78.0–100.0)
Platelets: 361 10*3/uL (ref 150–400)
RBC: 3.84 MIL/uL — ABNORMAL LOW (ref 3.87–5.11)
RDW: 12.5 % (ref 11.5–15.5)
WBC: 10.7 10*3/uL — ABNORMAL HIGH (ref 4.0–10.5)

## 2015-12-24 LAB — LIPASE, BLOOD: Lipase: 39 U/L (ref 11–51)

## 2015-12-24 MED ORDER — SODIUM CHLORIDE 0.9 % IV BOLUS (SEPSIS)
1000.0000 mL | Freq: Once | INTRAVENOUS | Status: AC
  Administered 2015-12-24: 1000 mL via INTRAVENOUS

## 2015-12-24 MED ORDER — METRONIDAZOLE 500 MG PO TABS: 500.0000 mg | ORAL_TABLET | Freq: Two times a day (BID) | ORAL | Status: DC

## 2015-12-24 MED ORDER — IOPAMIDOL (ISOVUE-300) INJECTION 61%
INTRAVENOUS | Status: AC
  Administered 2015-12-24: 100 mL
  Filled 2015-12-24: qty 100

## 2015-12-24 MED ORDER — CIPROFLOXACIN HCL 500 MG PO TABS: 500.0000 mg | ORAL_TABLET | Freq: Two times a day (BID) | ORAL | Status: DC

## 2015-12-24 NOTE — ED Notes (Signed)
Pt states epigastric pain x 5 days.  Pain increases at night.  Pt has been taking sucralfate as prescribed by her GI with no relief.

## 2015-12-24 NOTE — ED Notes (Signed)
Pt. Has requested no IV until seen by the MD

## 2015-12-24 NOTE — ED Notes (Signed)
Pt. oob  To the bathroom, gait steady

## 2015-12-24 NOTE — ED Notes (Addendum)
Pt. oob to the bathroom, gait is steady

## 2015-12-24 NOTE — Discharge Instructions (Signed)
Return here as needed.  Follow-up with the GI doctor as soon as possible.  Use a bland diet, avoid dairy products   Food Choices  When you have diarrhea, the foods you eat and your eating habits are very important. Choosing the right foods and drinks can help relieve diarrhea. Also, because diarrhea can last up to 7 days, you need to replace lost fluids and electrolytes (such as sodium, potassium, and chloride) in order to help prevent dehydration.  WHAT GENERAL GUIDELINES DO I NEED TO FOLLOW?  Slowly drink 1 cup (8 oz) of fluid for each episode of diarrhea. If you are getting enough fluid, your urine will be clear or pale yellow.  Eat starchy foods. Some good choices include white rice, white toast, pasta, low-fiber cereal, baked potatoes (without the skin), saltine crackers, and bagels.  Avoid large servings of any cooked vegetables.  Limit fruit to two servings per day. A serving is  cup or 1 small piece.  Choose foods with less than 2 g of fiber per serving.  Limit fats to less than 8 tsp (38 g) per day.  Avoid fried foods.  Eat foods that have probiotics in them. Probiotics can be found in certain dairy products.  Avoid foods and beverages that may increase the speed at which food moves through the stomach and intestines (gastrointestinal tract). Things to avoid include:  High-fiber foods, such as dried fruit, raw fruits and vegetables, nuts, seeds, and whole grain foods.  Spicy foods and high-fat foods.  Foods and beverages sweetened with high-fructose corn syrup, honey, or sugar alcohols such as xylitol, sorbitol, and mannitol. WHAT FOODS ARE RECOMMENDED? Grains White rice. White, [place], or pita breads (fresh or toasted), including plain rolls, buns, or bagels. White pasta. Saltine, soda, or graham crackers. Pretzels. Low-fiber cereal. Cooked cereals made with water (such as cornmeal, farina, or cream cereals). Plain muffins. Matzo. Melba toast. Zwieback.   Vegetables Potatoes (without the skin). Strained tomato and vegetable juices. Most well-cooked and canned vegetables without seeds. Tender lettuce. Fruits Cooked or canned applesauce, apricots, cherries, fruit cocktail, grapefruit, peaches, pears, or plums. Fresh bananas, apples without skin, cherries, grapes, cantaloupe, grapefruit, peaches, oranges, or plums.  Meat and Other Protein Products Baked or boiled chicken. Eggs. Tofu. Fish. Seafood. Smooth peanut butter. Ground or well-cooked tender beef, ham, veal, lamb, pork, or poultry.  Dairy Plain yogurt, kefir, and unsweetened liquid yogurt. Lactose-free milk, buttermilk, or soy milk. Plain hard cheese. Beverages Sport drinks. Clear broths. Diluted fruit juices (except prune). Regular, caffeine-free sodas such as ginger ale. Water. Decaffeinated teas. Oral rehydration solutions. Sugar-free beverages not sweetened with sugar alcohols. Other Bouillon, broth, or soups made from recommended foods.  The items listed above may not be a complete list of recommended foods or beverages. Contact your dietitian for more options. WHAT FOODS ARE NOT RECOMMENDED? Grains Whole grain, whole wheat, bran, or rye breads, rolls, pastas, crackers, and cereals. Wild or brown rice. Cereals that contain more than 2 g of fiber per serving. Corn tortillas or taco shells. Cooked or dry oatmeal. Granola. Popcorn. Vegetables Raw vegetables. Cabbage, broccoli, Brussels sprouts, artichokes, baked beans, beet greens, corn, kale, legumes, peas, sweet potatoes, and yams. Potato skins. Cooked spinach and cabbage. Fruits Dried fruit, including raisins and dates. Raw fruits. Stewed or dried prunes. Fresh apples with skin, apricots, mangoes, pears, raspberries, and strawberries.  Meat and Other Protein Products Chunky peanut butter. Nuts and seeds. Beans and lentils. Berniece Salines.  Dairy High-fat cheeses. Milk, chocolate milk, and  beverages made with milk, such as milk shakes. Cream.  Ice cream. Sweets and Desserts Sweet rolls, doughnuts, and sweet breads. Pancakes and waffles. Fats and Oils Butter. Cream sauces. Margarine. Salad oils. Plain salad dressings. Olives. Avocados.  Beverages Caffeinated beverages (such as coffee, tea, soda, or energy drinks). Alcoholic beverages. Fruit juices with pulp. Prune juice. Soft drinks sweetened with high-fructose corn syrup or sugar alcohols. Other Coconut. Hot sauce. Chili powder. Mayonnaise. Gravy. Cream-based or milk-based soups.  The items listed above may not be a complete list of foods and beverages to avoid. Contact your dietitian for more information. WHAT SHOULD I DO IF I BECOME DEHYDRATED? Diarrhea can sometimes lead to dehydration. Signs of dehydration include dark urine and dry mouth and skin. If you think you are dehydrated, you should rehydrate with an oral rehydration solution. These solutions can be purchased at pharmacies, retail stores, or online.  Drink -1 cup (120-240 mL) of oral rehydration solution each time you have an episode of diarrhea. If drinking this amount makes your diarrhea worse, try drinking smaller amounts more often. For example, drink 1-3 tsp (5-15 mL) every 5-10 minutes.  A general rule for staying hydrated is to drink 1-2 L of fluid per day. Talk to your health care provider about the specific amount you should be drinking each day. Drink enough fluids to keep your urine clear or pale yellow.   This information is not intended to replace advice given to you by your health care provider. Make sure you discuss any questions you have with your health care provider.   Document Released: 10/19/2003 Document Revised: 08/19/2014 Document Reviewed: 06/21/2013 Elsevier Interactive Patient Education [employer].

## 2015-12-24 NOTE — ED Provider Notes (Signed)
CSN: [id number]     Arrival date & time 12/24/15  [id number] History   First MD Initiated Contact with Patient 12/24/15 1118     Chief Complaint  Patient presents with  . Abdominal Pain     (Consider location/radiation/quality/duration/timing/severity/associated sxs/prior Treatment) HPI Patient presents to the emergency department with abdominal pain that started one week ago.  The patient states that she has upper abdominal pain, but some generalized pain as well.  Patient states she takes Carafate at home for a previous gastric ulcers.  Patient states that nothing seems make the condition better or worse.  She states that she has not been able to eat as well.  She has previously due to the discomfort.  The patient states she did contact her primary care doctor about the symptoms and has an upcoming appointment.  She has previously seen a GI doctor. The patient denies chest pain, shortness of breath, headache,blurred vision, neck pain, fever, cough, weakness, numbness, dizziness, anorexia, edema,  nausea, vomiting, diarrhea, rash, back pain, dysuria, hematemesis, bloody stool, near syncope, or syncope. Past Medical History  Diagnosis Date  . Intention tremor   . PUD (peptic ulcer disease)   . Helicobacter pylori gastritis   . PVD (peripheral vascular disease) ([place])   . Lumbago   . Osteoarthritis of spine   . Fibromyalgia   . Pneumonia     hx of years ago   . GERD (gastroesophageal reflux disease)   . Esophageal stricture   . Cancer ([place])   . Hypertension    Past Surgical History  Procedure Laterality Date  . Lumbar fusion  03/2003    L4-5  . Tubal ligation  1984  . Tonsillectomy  1958  . Breast enhancement surgery  1974  . Abdominal hysterectomy  1985  . Cataract extraction Bilateral 2014    shapiro  . Laparoscopic partial nephrectomy Left April '13    [place], Dr. Alinda Money   Family History  Problem Relation Age of Onset  . Heart disease Mother   . COPD Mother   . Thyroid disease  Mother   . Diabetes Father   . Heart disease Father     CABG  . Stroke Father   . Heart disease Brother   . Diabetes Daughter   . Colon cancer Neg Hx   . Stroke Brother   . Parkinson's disease Father    Social History  Substance Use Topics  . Smoking status: Never Smoker   . Smokeless tobacco: Never Used  . Alcohol Use: Yes     Comment: occasional glass of wine with dinner    OB History    No data available     Review of Systems All other systems negative except as documented in the HPI. All pertinent positives and negatives as reviewed in the HPI.   Allergies  Morphine and related  Home Medications   Prior to Admission medications   Medication Sig Start Date End Date Taking? Authorizing Provider  amitriptyline (ELAVIL) 25 MG tablet TAKE 1 TABLET BY MOUTH EVERY DAY AT BEDTIME 11/07/15  Yes Hoyt Koch, MD  amLODipine (NORVASC) 5 MG tablet TAKE 1 TABLET BY MOUTH DAILY 05/02/15  Yes Hoyt Koch, MD  calcium-vitamin D (OSCAL WITH D) 500-200 MG-UNIT per tablet Take 1 tablet by mouth daily.   Yes Historical Provider, MD  cholecalciferol (VITAMIN D) 1000 UNITS tablet Take 1,000 Units by mouth daily.   Yes Historical Provider, MD  furosemide (LASIX) 20 MG tablet TAKE 1 TABLET  BY MOUTH DAILY 05/02/15  Yes Hoyt Koch, MD  Melatonin 3 MG TABS Take 6 mg by mouth at bedtime.   Yes Historical Provider, MD  metoprolol succinate (TOPROL-XL) 50 MG 24 hr tablet Take 1 tablet (50 mg total) by mouth daily. 01/12/15  Yes Rowe Clack, MD  Multiple Vitamins-Minerals (MULTIVITAMIN WITH MINERALS) tablet Take 1 tablet by mouth daily.   Yes Historical Provider, MD  omeprazole (PRILOSEC) 40 MG capsule TAKE ONE CAPSULE BY MOUTH TWICE DAILY 07/25/15  Yes Hoyt Koch, MD  OVER THE COUNTER MEDICATION Tumeric 1 capsule twice daily.   Yes Historical Provider, MD  sucralfate (CARAFATE) 1 G tablet Take as crushed tablet in water (suspension) before meals and bedtime  07/26/13  Yes Neena Rhymes, MD  traMADol (ULTRAM) 50 MG tablet TAKE 1 TABLET BY MOUTH EVERY 8 HOURS AS NEEDED FOR MODERATE TO SEVERE PAIN, MAY TAKE 1 ADDITIONAL TABLET PER DAY FOR SEVERE PAIN 10/06/15  Yes Hoyt Koch, MD   BP 125/76 mmHg  Pulse 72  Temp(Src) 98.1 F (36.7 C) (Oral)  Resp 19  Ht 5\' 2"  (1.575 m)  Wt 58.968 kg  BMI 23.77 kg/m2  SpO2 99% Physical Exam  Constitutional: She is oriented to person, place, and time. She appears well-developed and well-nourished. No distress.  HENT:  Head: Normocephalic and atraumatic.  Mouth/Throat: Oropharynx is clear and moist.  Eyes: Pupils are equal, round, and reactive to light.  Neck: Normal range of motion. Neck supple.  Cardiovascular: Normal rate, regular rhythm and normal heart sounds.  Exam reveals no gallop and no friction rub.   No murmur heard. Pulmonary/Chest: Effort normal and breath sounds normal. No respiratory distress. She has no wheezes.  Abdominal: Soft. Bowel sounds are normal. She exhibits no distension. There is tenderness. There is no rebound and no guarding.  Neurological: She is alert and oriented to person, place, and time. She exhibits normal muscle tone. Coordination normal.  Skin: Skin is warm and dry. No rash noted. No erythema.  Psychiatric: She has a normal mood and affect. Her behavior is normal.  Nursing note and vitals reviewed.   ED Course  Procedures (including critical care time) Labs Review Labs Reviewed  COMPREHENSIVE METABOLIC PANEL - Abnormal; Notable for the following:    Glucose, Bld 102 (*)    All other components within normal limits  CBC - Abnormal; Notable for the following:    WBC 10.7 (*)    RBC 3.84 (*)    Hemoglobin 11.5 (*)    HCT 35.2 (*)    All other components within normal limits  LIPASE, BLOOD  URINALYSIS, ROUTINE W REFLEX MICROSCOPIC (NOT AT [facility])    Imaging Review Ct Abdomen Pelvis W Contrast  12/24/2015  CLINICAL DATA:  75 year old female with mid  abdominal pain for 1 week. No associated nausea, vomiting or diarrhea. EXAM: CT ABDOMEN AND PELVIS WITH CONTRAST TECHNIQUE: Multidetector CT imaging of the abdomen and pelvis was performed using the standard protocol following bolus administration of intravenous contrast. CONTRAST:  123mL ISOVUE-300 IOPAMIDOL (ISOVUE-300) INJECTION 61% COMPARISON:  No priors. FINDINGS: Lower chest:  Small hiatal hernia. Hepatobiliary: No suspicious cystic or solid hepatic lesions. No intra or extrahepatic biliary ductal dilatation. Gallbladder is normal in appearance. Pancreas: No pancreatic mass. No pancreatic ductal dilatation. No pancreatic or peripancreatic fluid or inflammatory changes. Spleen: Unremarkable Adrenals/Urinary Tract: Some areas of cortical thinning are noted in the interpolar and lower pole region of the left kidney, most compatible with areas  of chronic post infectious or inflammatory scarring. Right kidney is normal in appearance. Bilateral adrenal glands are normal in appearance. No hydroureteronephrosis. Urinary bladder is normal in appearance. Stomach/Bowel: Normal appearance of the stomach. Profound thickening of the terminal ileum where there is avid mucosal enhancement and surrounding inflammatory changes, concerning for ileitis. There also appears to be some adjacent colonic wall thickening involving portions of the ascending colon and transverse colon, however, this is difficult to say for certain given that these regions are relatively decompressed. Multiple borderline enlarged and mildly enlarged ileocolic lymph nodes are presumably reactive. The appendix is not confidently identified and may be surgically absent. Vascular/Lymphatic: Mild atherosclerotic disease throughout the abdominal and pelvic vasculature, without evidence of aneurysm or dissection. No lymphadenopathy noted in the abdomen or pelvis. Reproductive: Status post hysterectomy. Ovaries are not confidently identified may be surgically  absent or atrophic. Other: Trace volume of ascites.  No pneumoperitoneum. Musculoskeletal: Status post PLIF at L4-L5 with interbody graft at the L4-L5 interspace. There are no aggressive appearing lytic or blastic lesions noted in the visualized portions of the skeleton. IMPRESSION: 1. Profound thickening of the terminal ileum with associated hyperenhancement in the ileal mucosa at and before the ileocecal junction with surrounding inflammatory changes, compatible with an acute ileitis. This can be infectious, or could be inflammatory as can be seen in the setting of inflammatory bowel disease such as Crohn's enteritis. Clinical correlation is recommended. 2. Apparent thickening of the colonic wall suggesting concurrent colitis. This is most evident in the cecum and proximal ascending colon, but there may be involvement of the transverse colon as well (difficult to judge on today's examination secondary to decompression of the colon throughout these regions). 3. Mild atherosclerosis. 4. Small hiatal hernia. 5. Additional incidental findings, as above. Electronically Signed   By: Vinnie Langton M.D.   On: 12/24/2015 14:34   I have personally reviewed and evaluated these images and lab results as part of my medical decision-making.    MDM   Final diagnoses:  None     Patient be treated for colitis and ileitis.  Told to follow up closely with her GI doctor.  The patient has not had any vomiting or nausea here in the emergency department.  She is advised to return here for any worsening in her condition such as nausea, vomiting, fever or worsening pain.  Patient is advised to use a bland diet  Dalia Heading, PA-C 12/24/15 [place], MD 12/24/15 1655

## 2015-12-25 ENCOUNTER — Ambulatory Visit: Payer: Medicare Other | Admitting: Family

## 2015-12-25 ENCOUNTER — Telehealth: Payer: Self-pay | Admitting: Internal Medicine

## 2015-12-25 NOTE — Telephone Encounter (Signed)
Offered pt an appt tomorrow at 11:00am but pt states she cannot come then. Pt scheduled to see Alonza Bogus PA 12/28/15@2pm . Pt aware of appt.

## 2015-12-26 ENCOUNTER — Ambulatory Visit: Payer: Medicare Other | Admitting: Internal Medicine

## 2015-12-28 ENCOUNTER — Encounter: Payer: Self-pay | Admitting: Gastroenterology

## 2015-12-28 ENCOUNTER — Ambulatory Visit (INDEPENDENT_AMBULATORY_CARE_PROVIDER_SITE_OTHER): Payer: Medicare Other | Admitting: Gastroenterology

## 2015-12-28 VITALS — BP 116/60 | HR 68 | Wt 131.8 lb

## 2015-12-28 DIAGNOSIS — R938 Abnormal findings on diagnostic imaging of other specified body structures: Secondary | ICD-10-CM

## 2015-12-28 DIAGNOSIS — R1084 Generalized abdominal pain: Secondary | ICD-10-CM

## 2015-12-28 DIAGNOSIS — R9389 Abnormal findings on diagnostic imaging of other specified body structures: Secondary | ICD-10-CM

## 2015-12-28 MED ORDER — SACCHAROMYCES BOULARDII 250 MG PO CAPS: 250.0000 mg | ORAL_CAPSULE | Freq: Two times a day (BID) | ORAL | Status: DC

## 2015-12-28 MED ORDER — NA SULFATE-K SULFATE-MG SULF 17.5-3.13-1.6 GM/177ML PO SOLN: 1.0000 | Freq: Once | ORAL | Status: AC

## 2015-12-28 NOTE — Patient Instructions (Signed)
You have been scheduled for a colonoscopy. Please follow written instructions given to you at your visit today.  Please pick up your prep supplies at the pharmacy within the next 1-3 days. [facility], [address] [place]  If you use inhalers (even only as needed), please bring them with you on the day of your procedure. Your physician has requested that you go to www.startemmi.com and enter the access code given to you at your visit today. This web site gives a general overview about your procedure. However, you should still follow specific instructions given to you by our office regarding your preparation for the procedure.  Complete the antiboitics.  Start Florastor twice daily. We sent a prescription for this to [facility] also.

## 2015-12-28 NOTE — Progress Notes (Signed)
     12/28/2015 [name] [id number] 09/23/1940   History of Present Illness:  This is a 75 year old female who is known to Dr. Henrene Pastor for treatment of reflux, esophageal stricture, hiatal hernia.  She presents to our office today for follow-up after a recent emergency room visit. She was seen in the ER on May 14 for complaints of abdominal pain. She says that she has chronic issues with abdominal pain related to reflux, etc. This pain was different and most of her abdomen just felt sore. She had no associated nausea, vomiting, or diarrhea. She underwent CT scan of the abdomen and pelvis with contrast that showed profound thickening of the terminal ileum with associated hyper enhancement of the ileal mucosa at and before the IC valve with surrounding inflammatory changes compatible with ileitis either infectious or inflammatory in nature. It also showed apparent thickening of the colonic wall suggesting concurrent colitis most evident in the cecum and proximal ascending colon. CBC, CMP, and lipase relatively unremarkable. She was placed on a 10 day course of Cipro 500 mg twice a day and Flagyl 500 mg twice a day, which she is still currently taking. She tells me that her pain began 2 or 3 days prior to her emergency room visit, and just prior to that she was feeling well. She tells me that she did start with some diarrhea yesterday and has had 8 episodes so far today, however.  Her last colonoscopy was in July 2009 at which time the exam was normal and repeat was recommended in 10 years.   Current Medications, Allergies, Past Medical History, Past Surgical History, Family History and Social History were reviewed in [employer] record.   Physical Exam: BP 116/60 mmHg  Pulse 68  Wt 131 lb 12.8 oz (59.784 kg) General: Well developed white female in no acute distress Head: Normocephalic and atraumatic Eyes:  Sclerae anicteric, conjunctiva pink  Ears: Normal auditory  acuity Lungs: Clear throughout to auscultation Heart: Regular rate and rhythm Abdomen: Soft, non-distended.  Normal bowel sounds.  Epigastric and LLQ TTP. Rectal:  Will be done at the time of colonoscopy. Musculoskeletal: Symmetrical with no gross deformities  Extremities: No edema  Neurological: Alert oriented x 4, grossly non-focal Psychological:  Alert and cooperative. Normal mood and affect  Assessment and Recommendations: -Abnormal CT scan showing thickening of the terminal ileum and colon suggesting colitis/ileitis. She is being treated with Cipro and Flagyl empirically for infectious etiology. Her only complaint was abdominal pain. It would certainly be unusual for someone of her age to have a new diagnosis of IBD, although not unheard of. Infectious source seems more likely, but she had not associated nausea, vomiting, diarrhea.  She will complete her course of antibiotics and then I have asked her to begin taking a daily probiotic in the form of Florastor twice daily. She will then be scheduled for a colonoscopy with Dr. Henrene Pastor a few weeks from now to reassess for chronicity.  The risks, benefits, and alternatives to colonoscopy were discussed with the patient and she consents to proceed.  She is now having diarrhea for the past 2 days, which may be antibiotic related.

## 2015-12-29 NOTE — Progress Notes (Signed)
Reviewed and agree with plans

## 2016-01-18 ENCOUNTER — Other Ambulatory Visit: Payer: Self-pay | Admitting: Internal Medicine

## 2016-01-18 ENCOUNTER — Ambulatory Visit: Payer: Medicare Other | Admitting: Internal Medicine

## 2016-01-30 ENCOUNTER — Encounter: Payer: Self-pay | Admitting: Internal Medicine

## 2016-01-31 ENCOUNTER — Encounter: Payer: Medicare Other | Admitting: Internal Medicine

## 2016-01-31 ENCOUNTER — Telehealth: Payer: Self-pay

## 2016-01-31 MED ORDER — TRAMADOL HCL 50 MG PO TABS: ORAL_TABLET | ORAL | Status: DC

## 2016-01-31 NOTE — Telephone Encounter (Signed)
Sent to pharmacy 

## 2016-01-31 NOTE — Telephone Encounter (Signed)
Printed and signed.  

## 2016-01-31 NOTE — Telephone Encounter (Signed)
traMADol (ULTRAM) 50 MG tablet [id number]      Patient is requesting a refill on this medication. She states she is moving and will run out 3 days before she should.

## 2016-02-08 ENCOUNTER — Ambulatory Visit (AMBULATORY_SURGERY_CENTER): Payer: Medicare Other | Admitting: Internal Medicine

## 2016-02-08 ENCOUNTER — Encounter: Payer: Self-pay | Admitting: Internal Medicine

## 2016-02-08 VITALS — BP 119/53 | HR 62 | Temp 97.8°F | Resp 11 | Ht 62.0 in | Wt 131.0 lb

## 2016-02-08 DIAGNOSIS — R933 Abnormal findings on diagnostic imaging of other parts of digestive tract: Secondary | ICD-10-CM

## 2016-02-08 DIAGNOSIS — R938 Abnormal findings on diagnostic imaging of other specified body structures: Secondary | ICD-10-CM | POA: Diagnosis not present

## 2016-02-08 DIAGNOSIS — R9389 Abnormal findings on diagnostic imaging of other specified body structures: Secondary | ICD-10-CM

## 2016-02-08 MED ORDER — SODIUM CHLORIDE 0.9 % IV SOLN: 500.0000 mL | INTRAVENOUS | Status: DC

## 2016-02-08 NOTE — Progress Notes (Signed)
A and O x3. Report to RN. Tolerated MAC anesthesia well. 

## 2016-02-08 NOTE — Op Note (Signed)
[facility] Patient Name: [name] Procedure Date: 02/08/2016 2:08 PM MRN: [id number] Endoscopist: Docia Chuck. Henrene Pastor , MD Age: 75 Referring MD:  Date of Birth: 07-25-1941 Gender: Female Account #: [id number] Procedure:                Colonoscopy Indications:              Abnormal CT of the GI tract. Acute abdominal                            symptoms. Inflammatory changes in the ileum and                            right colon. All symptoms have resolved and patient                            is back to baseline since her office visit last                            month. Previous colonoscopy 2009 was negative Medicines:                Monitored Anesthesia Care Procedure:                Pre-Anesthesia Assessment:                           - Prior to the procedure, a History and Physical                            was performed, and patient medications and                            allergies were reviewed. The patient's tolerance of                            previous anesthesia was also reviewed. The risks                            and benefits of the procedure and the sedation                            options and risks were discussed with the patient.                            All questions were answered, and informed consent                            was obtained. Prior Anticoagulants: The patient has                            taken no previous anticoagulant or antiplatelet                            agents. ASA Grade Assessment: II - A patient with  mild systemic disease. After reviewing the risks                            and benefits, the patient was deemed in                            satisfactory condition to undergo the procedure.                           After obtaining informed consent, the colonoscope                            was passed under direct vision. Throughout the                            procedure, the patient's  blood pressure, pulse, and                            oxygen saturations were monitored continuously. The                            Model CF-HQ190L [id number]) scope was introduced                            through the anus and advanced to the the cecum,                            identified by appendiceal orifice and ileocecal                            valve. The terminal ileum, ileocecal valve,                            appendiceal orifice, and rectum were photographed.                            The quality of the bowel preparation was adequate.                            The colonoscopy was performed without difficulty.                            The patient tolerated the procedure well. The bowel                            preparation used was SUPREP. Scope In: 2:28:15 PM Scope Out: 2:45:31 PM Scope Withdrawal Time: 0 hours 11 minutes 47 seconds  Total Procedure Duration: 0 hours 17 minutes 16 seconds  Findings:                 A few diverticula were found in the sigmoid colon.                           The entire examined colon appeared normal on direct  and retroflexion views.                           The terminal ileum appeared normal. Complications:            No immediate complications. Estimated blood loss:                            None. Estimated Blood Loss:     Estimated blood loss: none. Impression:               - Diverticulosis in the sigmoid colon.                           - The entire examined colon is normal on direct and                            retroflexion views.                           - The examined portion of the ileum was normal.                           - No specimens collected. Recommendation:           - Repeat colonoscopy is not recommended for                            screening purposes.                           - Patient has a contact number available for                            emergencies. The signs and  symptoms of potential                            delayed complications were discussed with the                            patient. Return to normal activities tomorrow.                            Written discharge instructions were provided to the                            patient.                           - Resume previous diet.                           - Continue present medications. Docia Chuck. Henrene Pastor, MD 02/08/2016 2:50:07 PM This report has been signed electronically.

## 2016-02-08 NOTE — Patient Instructions (Signed)
YOU HAD AN ENDOSCOPIC PROCEDURE TODAY AT THE [facility]:   Refer to the procedure report that was given to you for any specific questions about what was found during the examination.  If the procedure report does not answer your questions, please call your gastroenterologist to clarify.  If you requested that your care partner not be given the details of your procedure findings, then the procedure report has been included in a sealed envelope for you to review at your convenience later.  YOU SHOULD EXPECT: Some feelings of bloating in the abdomen. Passage of more gas than usual.  Walking can help get rid of the air that was put into your GI tract during the procedure and reduce the bloating. If you had a lower endoscopy (such as a colonoscopy or flexible sigmoidoscopy) you may notice spotting of blood in your stool or on the toilet paper. If you underwent a bowel prep for your procedure, you may not have a normal bowel movement for a few days.  Please Note:  You might notice some irritation and congestion in your nose or some drainage.  This is from the oxygen used during your procedure.  There is no need for concern and it should clear up in a day or so.  SYMPTOMS TO REPORT IMMEDIATELY:   Following lower endoscopy (colonoscopy or flexible sigmoidoscopy):  Excessive amounts of blood in the stool  Significant tenderness or worsening of abdominal pains  Swelling of the abdomen that is new, acute  Fever of 100F or higher   For urgent or emergent issues, a gastroenterologist can be reached at any hour by calling [phone].   DIET: Your first meal following the procedure should be a small meal and then it is ok to progress to your normal diet. Heavy or fried foods are harder to digest and may make you feel nauseous or bloated.  Likewise, meals heavy in dairy and vegetables can increase bloating.  Drink plenty of fluids but you should avoid alcoholic beverages for 24  hours.  ACTIVITY:  You should plan to take it easy for the rest of today and you should NOT DRIVE or use heavy machinery until tomorrow (because of the sedation medicines used during the test).    FOLLOW UP: Our staff will call the number listed on your records the next business day following your procedure to check on you and address any questions or concerns that you may have regarding the information given to you following your procedure. If we do not reach you, we will leave a message.  However, if you are feeling well and you are not experiencing any problems, there is no need to return our call.  We will assume that you have returned to your regular daily activities without incident.  If any biopsies were taken you will be contacted by phone or by letter within the next 1-3 weeks.  Please call us at [phone] if you have not heard about the biopsies in 3 weeks.    SIGNATURES/CONFIDENTIALITY: You and/or your care partner have signed paperwork which will be entered into your electronic medical record.  These signatures attest to the fact that that the information above on your After Visit Summary has been reviewed and is understood.  Full responsibility of the confidentiality of this discharge information lies with you and/or your care-partner. 

## 2016-02-09 ENCOUNTER — Telehealth: Payer: Self-pay | Admitting: *Deleted

## 2016-02-09 NOTE — Telephone Encounter (Signed)
  Follow up Call-  Call back number 02/08/2016  Post procedure Call Back phone  # [phone]  Permission to leave phone message Yes     Patient questions:  Do you have a fever, pain , or abdominal swelling? No. Pain Score  0 *  Have you tolerated food without any problems? Yes.    Have you been able to return to your normal activities? Yes.    Do you have any questions about your discharge instructions: Diet   No. Medications  No. Follow up visit  No.  Do you have questions or concerns about your Care? No.  Actions: * If pain score is 4 or above: No action needed, pain <4.

## 2016-02-20 ENCOUNTER — Ambulatory Visit (HOSPITAL_COMMUNITY)
Admission: RE | Admit: 2016-02-20 | Discharge: 2016-02-20 | Disposition: A | Discharge status: Home / Self Care | Payer: Medicare Other | Source: Ambulatory Visit | Attending: Urology | Admitting: Urology

## 2016-02-20 ENCOUNTER — Other Ambulatory Visit (HOSPITAL_COMMUNITY): Payer: Self-pay | Admitting: Urology

## 2016-02-20 DIAGNOSIS — C642 Malignant neoplasm of left kidney, except renal pelvis: Secondary | ICD-10-CM

## 2016-02-20 DIAGNOSIS — Z85528 Personal history of other malignant neoplasm of kidney: Secondary | ICD-10-CM | POA: Insufficient documentation

## 2016-03-13 ENCOUNTER — Other Ambulatory Visit: Payer: Self-pay | Admitting: Internal Medicine

## 2016-04-16 ENCOUNTER — Encounter: Payer: Self-pay | Admitting: Internal Medicine

## 2016-04-16 ENCOUNTER — Ambulatory Visit (INDEPENDENT_AMBULATORY_CARE_PROVIDER_SITE_OTHER): Payer: Medicare Other | Admitting: Internal Medicine

## 2016-04-16 DIAGNOSIS — G4700 Insomnia, unspecified: Secondary | ICD-10-CM

## 2016-04-16 MED ORDER — FUROSEMIDE 20 MG PO TABS: 20.0000 mg | ORAL_TABLET | Freq: Every day | ORAL | 3 refills | Status: DC

## 2016-04-16 MED ORDER — METOPROLOL SUCCINATE ER 50 MG PO TB24: 50.0000 mg | ORAL_TABLET | Freq: Every day | ORAL | 3 refills | Status: DC

## 2016-04-16 MED ORDER — AMLODIPINE BESYLATE 5 MG PO TABS: 5.0000 mg | ORAL_TABLET | Freq: Every day | ORAL | 3 refills | Status: DC

## 2016-04-16 MED ORDER — ALPRAZOLAM 0.5 MG PO TABS: 0.5000 mg | ORAL_TABLET | Freq: Two times a day (BID) | ORAL | 2 refills | Status: DC | PRN

## 2016-04-16 NOTE — Patient Instructions (Signed)
We have faxed in the xanax which you can take (1/2 to 1 pill) twice a day as needed. This is something that you can take at night time for sleep and during the day only as needed (I would recommend to try 1/2 pill if needed during the day).   Stress and Stress Management Stress is a normal reaction to life events. It is what you feel when life demands more than you are used to or more than you can handle. Some stress can be useful. For example, the stress reaction can help you catch the last bus of the day, study for a test, or meet a deadline at work. But stress that occurs too often or for too long can cause problems. It can affect your emotional health and interfere with relationships and normal daily activities. Too much stress can weaken your immune system and increase your risk for physical illness. If you already have a medical problem, stress can make it worse. CAUSES  All sorts of life events may cause stress. An event that causes stress for one person may not be stressful for another person. Major life events commonly cause stress. These may be positive or negative. Examples include losing your job, moving into a new home, getting married, having a baby, or losing a loved one. Less obvious life events may also cause stress, especially if they occur day after day or in combination. Examples include working long hours, driving in traffic, caring for children, being in debt, or being in a difficult relationship. SIGNS AND SYMPTOMS Stress may cause emotional symptoms including, the following:  Anxiety. This is feeling worried, afraid, on edge, overwhelmed, or out of control.  Anger. This is feeling irritated or impatient.  Depression. This is feeling sad, down, helpless, or guilty.  Difficulty focusing, remembering, or making decisions. Stress may cause physical symptoms, including the following:   Aches and pains. These may affect your head, neck, back, stomach, or other areas of your  body.  Tight muscles or clenched jaw.  Low energy or trouble sleeping. Stress may cause unhealthy behaviors, including the following:   Eating to feel better (overeating) or skipping meals.  Sleeping too little, too much, or both.  Working too much or putting off tasks (procrastination).  Smoking, drinking alcohol, or using drugs to feel better. DIAGNOSIS  Stress is diagnosed through an assessment by your health care provider. Your health care provider will ask questions about your symptoms and any stressful life events.Your health care provider will also ask about your medical history and may order blood tests or other tests. Certain medical conditions and medicine can cause physical symptoms similar to stress. Mental illness can cause emotional symptoms and unhealthy behaviors similar to stress. Your health care provider may refer you to a mental health professional for further evaluation.  TREATMENT  Stress management is the recommended treatment for stress.The goals of stress management are reducing stressful life events and coping with stress in healthy ways.  Techniques for reducing stressful life events include the following:  Stress identification. Self-monitor for stress and identify what causes stress for you. These skills may help you to avoid some stressful events.  Time management. Set your priorities, keep a calendar of events, and learn to say "no." These tools can help you avoid making too many commitments. Techniques for coping with stress include the following:  Rethinking the problem. Try to think realistically about stressful events rather than ignoring them or overreacting. Try to find the positives in  a stressful situation rather than focusing on the negatives.  Exercise. Physical exercise can release both physical and emotional tension. The key is to find a form of exercise you enjoy and do it regularly.  Relaxation techniques. These relax the body and mind.  Examples include yoga, meditation, tai chi, biofeedback, deep breathing, progressive muscle relaxation, listening to music, being out in nature, journaling, and other hobbies. Again, the key is to find one or more that you enjoy and can do regularly.  Healthy lifestyle. Eat a balanced diet, get plenty of sleep, and do not smoke. Avoid using alcohol or drugs to relax.  Strong support network. Spend time with family, friends, or other people you enjoy being around.Express your feelings and talk things over with someone you trust. Counseling or talktherapy with a mental health professional may be helpful if you are having difficulty managing stress on your own. Medicine is typically not recommended for the treatment of stress.Talk to your health care provider if you think you need medicine for symptoms of stress. HOME CARE INSTRUCTIONS  Keep all follow-up visits as directed by your health care provider.  Take all medicines as directed by your health care provider. SEEK MEDICAL CARE IF:  Your symptoms get worse or you start having new symptoms.  You feel overwhelmed by your problems and can no longer manage them on your own. SEEK IMMEDIATE MEDICAL CARE IF:  You feel like hurting yourself or someone else.   This information is not intended to replace advice given to you by your health care provider. Make sure you discuss any questions you have with your health care provider.   Document Released: 01/22/2001 Document Revised: 08/19/2014 Document Reviewed: 03/23/2013 Elsevier Interactive Patient Education [employer].

## 2016-04-16 NOTE — Progress Notes (Signed)
Pre visit review using our clinic review tool, if applicable. No additional management support is needed unless otherwise documented below in the visit note. 

## 2016-04-16 NOTE — Progress Notes (Signed)
   Subjective:    Patient ID: [name], female    DOB: 05-27-1941, 75 y.o.   MRN: [id number]  HPI The patient is a 75 YO female coming in for sleeping problems coming from recent move to a townhouse. She is not able to sleep as it is quiet and the noise is different from her previous place. It is also a size adjustment and she is struggling with that as they have gotten rid of a lot of furniture and stuff. She has taken low dose xanax in the past for sleep in difficult times and it worked for her. She did not feel bad the next day. This is a significant QOL issue as she is more grouchy during the day with loss of sleep and not able to do the things she wants.   Review of Systems  Constitutional: Negative for activity change, appetite change, fatigue and fever.  Respiratory: Negative for cough, chest tightness, shortness of breath and wheezing.   Cardiovascular: Negative for chest pain, palpitations and leg swelling.  Gastrointestinal: Negative for abdominal distention, abdominal pain, diarrhea and nausea.  Musculoskeletal: Positive for arthralgias and back pain. Negative for myalgias.  Skin: Negative.   Neurological: Negative.   Psychiatric/Behavioral: Positive for decreased concentration and sleep disturbance. Negative for dysphoric mood, hallucinations, self-injury and suicidal ideas. The patient is nervous/anxious.       Objective:   Physical Exam  Constitutional: She is oriented to person, place, and time. She appears well-developed and well-nourished.  HENT:  Head: Normocephalic and atraumatic.  Eyes: EOM are normal.  Neck: Normal range of motion.  Cardiovascular: Normal rate and regular rhythm.   Pulmonary/Chest: Effort normal and breath sounds normal.  Abdominal: Soft. Bowel sounds are normal. She exhibits no distension. There is no tenderness. There is no rebound.  Musculoskeletal: She exhibits no edema.  Neurological: She is alert and oriented to person, place, and time.    Skin: Skin is warm and dry.  Psychiatric: She has a normal mood and affect.   Vitals:   04/16/16 1615  BP: 118/62  Pulse: 66  Temp: 97.9 F (36.6 C)  SpO2: 98%  Weight: 134 lb (60.8 kg)      Assessment & Plan:  Flu shot given at visit.

## 2016-04-17 NOTE — Assessment & Plan Note (Signed)
She is taking melatonin which is not effective anymore. She will have rx for short term xanax for the adjustment period. We talked about a noise generator to help with the quiet. We talked about the risks with benzo therapy and she understands and wishes to proceed.

## 2016-04-19 ENCOUNTER — Other Ambulatory Visit: Payer: Self-pay | Admitting: Internal Medicine

## 2016-04-19 NOTE — Telephone Encounter (Signed)
Faxed script back to [facility].../lmb 

## 2016-05-06 ENCOUNTER — Ambulatory Visit (INDEPENDENT_AMBULATORY_CARE_PROVIDER_SITE_OTHER): Payer: Medicare Other | Admitting: Internal Medicine

## 2016-05-06 ENCOUNTER — Encounter: Payer: Self-pay | Admitting: Internal Medicine

## 2016-05-06 ENCOUNTER — Other Ambulatory Visit (INDEPENDENT_AMBULATORY_CARE_PROVIDER_SITE_OTHER): Payer: Medicare Other

## 2016-05-06 DIAGNOSIS — R609 Edema, unspecified: Secondary | ICD-10-CM | POA: Diagnosis not present

## 2016-05-06 DIAGNOSIS — I10 Essential (primary) hypertension: Secondary | ICD-10-CM | POA: Diagnosis not present

## 2016-05-06 LAB — BASIC METABOLIC PANEL
BUN: 19 mg/dL (ref 6–23)
CO2: 29 mEq/L (ref 19–32)
Calcium: 10 mg/dL (ref 8.4–10.5)
Chloride: 101 mEq/L (ref 96–112)
Creatinine, Ser: 0.89 mg/dL (ref 0.40–1.20)
GFR: 65.68 mL/min (ref >60.00)
Glucose, Bld: 100 mg/dL — ABNORMAL HIGH (ref 70–99)
Potassium: 4.4 mEq/L (ref 3.5–5.1)
Sodium: 138 mEq/L (ref 135–145)

## 2016-05-06 LAB — CBC WITH DIFFERENTIAL/PLATELET
Basophils Absolute: 0 10*3/uL (ref 0.0–0.1)
Basophils Relative: 0.4 % (ref 0.0–3.0)
Eosinophils Absolute: 0.3 10*3/uL (ref 0.0–0.7)
Eosinophils Relative: 4.1 % (ref 0.0–5.0)
HCT: 37.1 % (ref 36.0–46.0)
Hemoglobin: 12.5 g/dL (ref 12.0–15.0)
Lymphocytes Relative: 35.3 % (ref 12.0–46.0)
Lymphs Abs: 2.2 10*3/uL (ref 0.7–4.0)
MCHC: 33.6 g/dL (ref 30.0–36.0)
MCV: 88.4 fl (ref 78.0–100.0)
Monocytes Absolute: 0.5 10*3/uL (ref 0.1–1.0)
Monocytes Relative: 8.6 % (ref 3.0–12.0)
Neutro Abs: 3.3 10*3/uL (ref 1.4–7.7)
Neutrophils Relative %: 51.6 % (ref 43.0–77.0)
Platelets: 348 10*3/uL (ref 150.0–400.0)
RBC: 4.2 Mil/uL (ref 3.87–5.11)
RDW: 13.6 % (ref 11.5–15.5)
WBC: 6.4 10*3/uL (ref 4.0–10.5)

## 2016-05-06 LAB — URINALYSIS
Bilirubin Urine: NEGATIVE
Hgb urine dipstick: NEGATIVE
Ketones, ur: NEGATIVE
Leukocytes, UA: NEGATIVE
Nitrite: NEGATIVE
Specific Gravity, Urine: <=1.005 — AB (ref 1.000–1.030)
Total Protein, Urine: NEGATIVE
Urine Glucose: NEGATIVE
Urobilinogen, UA: 0.2 (ref 0.0–1.0)
pH: 7 (ref 5.0–8.0)

## 2016-05-06 LAB — SEDIMENTATION RATE: Sed Rate: 20 mm/hr (ref 0–30)

## 2016-05-06 LAB — HEPATIC FUNCTION PANEL
ALT: 18 U/L (ref 0–35)
AST: 26 U/L (ref 0–37)
Albumin: 4.7 g/dL (ref 3.5–5.2)
Alkaline Phosphatase: 70 U/L (ref 39–117)
Bilirubin, Direct: 0 mg/dL (ref 0.0–0.3)
Total Bilirubin: 0.3 mg/dL (ref 0.2–1.2)
Total Protein: 8.2 g/dL (ref 6.0–8.3)

## 2016-05-06 MED ORDER — AMLODIPINE BESYLATE 5 MG PO TABS: 2.5000 mg | ORAL_TABLET | Freq: Every day | ORAL | 3 refills | Status: DC

## 2016-05-06 MED ORDER — LOSARTAN POTASSIUM 100 MG PO TABS: 100.0000 mg | ORAL_TABLET | Freq: Every day | ORAL | 3 refills | Status: DC

## 2016-05-06 NOTE — Progress Notes (Signed)
Subjective:  Patient ID: [name], female    DOB: 10/26/1940  Age: 75 y.o. MRN: [id number]  CC: Foot Swelling (x 4 days) and Fatigue   HPI [name] presents for B LE edema since last week and elevated BP. She just had a 2 week car trip to [place] and back. No leg pain, no CP  Outpatient Medications Prior to Visit  Medication Sig Dispense Refill  . ALPRAZolam (XANAX) 0.5 MG tablet Take 1 tablet (0.5 mg total) by mouth 2 (two) times daily as needed for anxiety. 60 tablet 2  . amitriptyline (ELAVIL) 25 MG tablet TAKE 1 TABLET BY MOUTH EVERY DAY AT BEDTIME 90 tablet 3  . amLODipine (NORVASC) 5 MG tablet Take 1 tablet (5 mg total) by mouth daily. 90 tablet 3  . calcium-vitamin D (OSCAL WITH D) 500-200 MG-UNIT per tablet Take 1 tablet by mouth daily.    . cholecalciferol (VITAMIN D) 1000 UNITS tablet Take 1,000 Units by mouth daily.    . furosemide (LASIX) 20 MG tablet Take 1 tablet (20 mg total) by mouth daily. 90 tablet 3  . Melatonin 3 MG TABS Take 6 mg by mouth at bedtime.    . metoprolol succinate (TOPROL-XL) 50 MG 24 hr tablet Take 1 tablet (50 mg total) by mouth daily. Take with or immediately following a meal. 90 tablet 3  . Multiple Vitamins-Minerals (MULTIVITAMIN WITH MINERALS) tablet Take 1 tablet by mouth daily.    Marland Kitchen omeprazole (PRILOSEC) 40 MG capsule TAKE ONE CAPSULE BY MOUTH TWICE DAILY 60 capsule 5  . OVER THE COUNTER MEDICATION Tumeric 1 capsule twice daily.    . polyethylene glycol powder (GLYCOLAX/MIRALAX) powder MIX 17GM INTO LIQUID AND DRINK EVERY DAY 527 g 1  . traMADol (ULTRAM) 50 MG tablet TAKE 1 TABLET BY MOUTH EVERY 8 HOURS AS NEEDED FOR MODERATE TO SEVERE PAIN, MAY TAKE 1 ADDITIONAL TABLET PER DAY FOR SEVERE PAIN 100 tablet 1  . saccharomyces boulardii (FLORASTOR) 250 MG capsule Take 1 capsule (250 mg total) by mouth 2 (two) times daily. (Patient not taking: Reported on 05/06/2016) 60 capsule 1  . sucralfate (CARAFATE) 1 G tablet Take as crushed tablet in  water (suspension) before meals and bedtime (Patient not taking: Reported on 05/06/2016) 120 tablet 3   No facility-administered medications prior to visit.     ROS Review of Systems  Constitutional: Positive for fatigue. Negative for activity change, appetite change, chills and unexpected weight change.  HENT: Negative for congestion, mouth sores and sinus pressure.   Eyes: Negative for visual disturbance.  Respiratory: Negative for cough and chest tightness.   Gastrointestinal: Negative for abdominal pain and nausea.  Genitourinary: Negative for difficulty urinating, frequency and vaginal pain.  Musculoskeletal: Negative for back pain and gait problem.  Skin: Negative for pallor and rash.  Neurological: Positive for weakness. Negative for dizziness, tremors, numbness and headaches.  Psychiatric/Behavioral: Negative for confusion and sleep disturbance.    Objective:  BP (!) 150/80   Pulse (!) 59   Temp 97.9 F (36.6 C) (Oral)   Wt 136 lb (61.7 kg)   SpO2 97%   BMI 24.87 kg/m   BP Readings from Last 3 Encounters:  05/06/16 (!) 150/80  04/16/16 118/62  02/08/16 (!) 119/53    Wt Readings from Last 3 Encounters:  05/06/16 136 lb (61.7 kg)  04/16/16 134 lb (60.8 kg)  02/08/16 131 lb (59.4 kg)    Physical Exam  Constitutional: She appears well-developed. No distress.  HENT:  Head: Normocephalic.  Right Ear: External ear normal.  Left Ear: External ear normal.  Nose: Nose normal.  Mouth/Throat: Oropharynx is clear and moist.  Eyes: Conjunctivae are normal. Pupils are equal, round, and reactive to light. Right eye exhibits no discharge. Left eye exhibits no discharge.  Neck: Normal range of motion. Neck supple. No JVD present. No tracheal deviation present. No thyromegaly present.  Cardiovascular: Normal rate, regular rhythm and normal heart sounds.   Pulmonary/Chest: No stridor. No respiratory distress. She has no wheezes.  Abdominal: Soft. Bowel sounds are normal. She  exhibits no distension and no mass. There is no tenderness. There is no rebound and no guarding.  Musculoskeletal: She exhibits no edema or tenderness.  Lymphadenopathy:    She has no cervical adenopathy.  Neurological: She displays normal reflexes. No cranial nerve deficit. She exhibits normal muscle tone. Coordination normal.  Skin: No rash noted. No erythema.  Psychiatric: She has a normal mood and affect. Her behavior is normal. Judgment and thought content normal.  1+ - 2+ edema of B dorsal feet - NT Calves NT   Lab Results  Component Value Date   WBC 10.7 (H) 12/24/2015   HGB 11.5 (L) 12/24/2015   HCT 35.2 (L) 12/24/2015   PLT 361 12/24/2015   GLUCOSE 102 (H) 12/24/2015   CHOL 191 04/27/2015   TRIG 109.0 04/27/2015   HDL 64.30 04/27/2015   LDLCALC 105 (H) 04/27/2015   ALT 24 12/24/2015   AST 26 12/24/2015   NA 139 12/24/2015   K 4.4 12/24/2015   CL 103 12/24/2015   CREATININE 0.69 12/24/2015   BUN 7 12/24/2015   CO2 25 12/24/2015   TSH 5.62 (H) 11/25/2013   HGBA1C 5.8 05/04/2009   MICROALBUR <0.7 10/25/2015    Dg Chest 2 View  Result Date: 02/20/2016 CLINICAL DATA:  Follow-up renal cell carcinoma. Evaluate for Mets. No chest complaints. EXAM: CHEST  2 VIEW COMPARISON:  02/08/2015 FINDINGS: Heart and mediastinal contours are within normal limits. No focal opacities or effusions. No acute bony abnormality. Bilateral breast implants noted. IMPRESSION: No active cardiopulmonary disease. Electronically Signed   By: Rolm Baptise M.D.   On: 02/20/2016 11:07    Assessment & Plan:   There are no diagnoses linked to this encounter. I am having Ms. Brenneman maintain her multivitamin with minerals, calcium-vitamin D, sucralfate, cholecalciferol, OVER THE COUNTER MEDICATION, Melatonin, omeprazole, amitriptyline, saccharomyces boulardii, polyethylene glycol powder, metoprolol succinate, amLODipine, furosemide, ALPRAZolam, traMADol, and acetaminophen.  Meds ordered this encounter    Medications  . acetaminophen (TYLENOL) 500 MG tablet    Sig: Take 2,000 mg by mouth daily.     Follow-up: No Follow-up on file.  [name], MD

## 2016-05-06 NOTE — Assessment & Plan Note (Signed)
Reduce Amlodipine in 1/2 Cont other meds Toprol at hs Cont Furosemide Added Losartan NAS diet

## 2016-05-06 NOTE — Assessment & Plan Note (Signed)
9/17 B feet - likely to her Norvasc and a car trip related Compression socks Elevate legs Low salt diet

## 2016-05-06 NOTE — Progress Notes (Signed)
Pre visit review using our clinic review tool, if applicable. No additional management support is needed unless otherwise documented below in the visit note. 

## 2016-05-06 NOTE — Patient Instructions (Addendum)
Compression socks Elevate legs Low salt diet Reduce or stop Amlodipine Continue other meds

## 2016-05-07 ENCOUNTER — Other Ambulatory Visit: Payer: Self-pay | Admitting: Internal Medicine

## 2016-05-07 DIAGNOSIS — R600 Localized edema: Secondary | ICD-10-CM

## 2016-05-07 LAB — TSH: TSH: 4.87 u[IU]/mL — ABNORMAL HIGH (ref 0.35–4.50)

## 2016-05-07 LAB — D-DIMER, QUANTITATIVE: D-Dimer, Quant: 0.75 mcg/mL FEU — ABNORMAL HIGH (ref <0.50)

## 2016-05-10 ENCOUNTER — Telehealth: Payer: Self-pay | Admitting: Internal Medicine

## 2016-05-10 NOTE — Telephone Encounter (Signed)
Pt request to transfer from Dr. Sharlet Salina to Dr. Camila Li. Advise pt Dr. Camila Li is not taking new pt right now. Please advise.

## 2016-05-10 NOTE — Telephone Encounter (Signed)
Unfortunately, I'm not able to accept any more new patients at this time.  I'm sorry! Thank you!  

## 2016-05-10 NOTE — Telephone Encounter (Signed)
Pt aware.

## 2016-05-10 NOTE — Telephone Encounter (Signed)
Fine with me

## 2016-05-12 ENCOUNTER — Encounter: Payer: Self-pay | Admitting: Student

## 2016-05-13 ENCOUNTER — Ambulatory Visit (HOSPITAL_COMMUNITY)
Admission: RE | Admit: 2016-05-13 | Discharge: 2016-05-13 | Disposition: A | Discharge status: Home / Self Care | Payer: Medicare Other | Source: Ambulatory Visit | Attending: Internal Medicine | Admitting: Internal Medicine

## 2016-05-13 DIAGNOSIS — R600 Localized edema: Secondary | ICD-10-CM | POA: Diagnosis not present

## 2016-05-17 ENCOUNTER — Encounter: Payer: Self-pay | Admitting: Internal Medicine

## 2016-05-17 ENCOUNTER — Ambulatory Visit (INDEPENDENT_AMBULATORY_CARE_PROVIDER_SITE_OTHER): Payer: Medicare Other | Admitting: Internal Medicine

## 2016-05-17 VITALS — BP 128/70 | HR 77 | Temp 97.9°F | Resp 12 | Ht 62.0 in | Wt 138.0 lb

## 2016-05-17 DIAGNOSIS — E039 Hypothyroidism, unspecified: Secondary | ICD-10-CM

## 2016-05-17 MED ORDER — LEVOTHYROXINE SODIUM 50 MCG PO TABS: 50.0000 ug | ORAL_TABLET | Freq: Every day | ORAL | 3 refills | Status: DC

## 2016-05-17 NOTE — Progress Notes (Signed)
   Subjective:    Patient ID: [name], female    DOB: [birth year], 75 y.o.   MRN: [id number]  HPI The patient is a 75 YO female coming in for follow up of some labs and wanting results on recent doppler legs. This was reviewed with her and normal. She is having low thyroid levels on the labs and she endorses fatigue, weight gain, constipation which are new in the last 6 months. She is willing to try thyroid medication to see if this helps. She has family history of multiple members with thyroid problems. Her swelling in her legs is gone since last visit.   Review of Systems  Constitutional: Positive for fatigue. Negative for activity change and appetite change.  Respiratory: Negative.   Cardiovascular: Negative.   Gastrointestinal: Positive for constipation. Negative for abdominal distention, abdominal pain, diarrhea and nausea.  Musculoskeletal: Negative.   Skin: Negative.   Neurological: Negative.       Objective:   Physical Exam  Constitutional: She is oriented to person, place, and time. She appears well-developed and well-nourished.  HENT:  Head: Normocephalic and atraumatic.  Eyes: EOM are normal.  Cardiovascular: Normal rate and regular rhythm.   Pulmonary/Chest: Effort normal. No respiratory distress. She has no wheezes. She has no rales.  Abdominal: Soft. Bowel sounds are normal. She exhibits no distension. There is no tenderness. There is no rebound.  Musculoskeletal: She exhibits no edema.  Neurological: She is alert and oriented to person, place, and time.  Skin: Skin is warm and dry.   Vitals:   05/17/16 1104  BP: 128/70  Pulse: 77  Resp: 12  Temp: 97.9 F (36.6 C)  TempSrc: Oral  SpO2: 98%  Weight: 138 lb (62.6 kg)  Height: 5\' 2"  (1.575 m)      Assessment & Plan:

## 2016-05-17 NOTE — Assessment & Plan Note (Signed)
We did discuss how her TSH levels are borderline but that we can try low dose thyroid medication for her symptoms which are consistent with underactive thyroid. Start levothyroxine 50 mcg daily and check TSH and free T4 in about 6 weeks.

## 2016-05-17 NOTE — Progress Notes (Signed)
Pre visit review using our clinic review tool, if applicable. No additional management support is needed unless otherwise documented below in the visit note. 

## 2016-05-17 NOTE — Patient Instructions (Addendum)
We have sent in the thyroid medicine called levothyroxine. Take 1 pill daily separated from food or other medicines by 30 minutes.   Come back to the lab in about 6 weeks (around 06/28/16) and we will call you back with the results and we can check in with how you are feeling.

## 2016-06-18 ENCOUNTER — Ambulatory Visit (INDEPENDENT_AMBULATORY_CARE_PROVIDER_SITE_OTHER): Payer: Medicare Other | Admitting: Internal Medicine

## 2016-06-18 ENCOUNTER — Encounter: Payer: Self-pay | Admitting: Internal Medicine

## 2016-06-18 ENCOUNTER — Ambulatory Visit: Payer: Medicare Other | Admitting: Internal Medicine

## 2016-06-18 ENCOUNTER — Other Ambulatory Visit (INDEPENDENT_AMBULATORY_CARE_PROVIDER_SITE_OTHER): Payer: Medicare Other

## 2016-06-18 VITALS — BP 142/70 | HR 61 | Temp 98.0°F | Resp 12 | Ht 62.0 in | Wt 137.0 lb

## 2016-06-18 DIAGNOSIS — R609 Edema, unspecified: Secondary | ICD-10-CM | POA: Diagnosis not present

## 2016-06-18 DIAGNOSIS — E039 Hypothyroidism, unspecified: Secondary | ICD-10-CM

## 2016-06-18 DIAGNOSIS — I10 Essential (primary) hypertension: Secondary | ICD-10-CM

## 2016-06-18 LAB — COMPREHENSIVE METABOLIC PANEL
ALT: 15 U/L (ref 0–35)
AST: 22 U/L (ref 0–37)
Albumin: 4.7 g/dL (ref 3.5–5.2)
Alkaline Phosphatase: 62 U/L (ref 39–117)
BUN: 20 mg/dL (ref 6–23)
CO2: 30 mEq/L (ref 19–32)
Calcium: 10.1 mg/dL (ref 8.4–10.5)
Chloride: 100 mEq/L (ref 96–112)
Creatinine, Ser: 0.83 mg/dL (ref 0.40–1.20)
GFR: 71.17 mL/min (ref >60.00)
Glucose, Bld: 89 mg/dL (ref 70–99)
Potassium: 4.3 mEq/L (ref 3.5–5.1)
Sodium: 137 mEq/L (ref 135–145)
Total Bilirubin: 0.5 mg/dL (ref 0.2–1.2)
Total Protein: 7.6 g/dL (ref 6.0–8.3)

## 2016-06-18 LAB — CBC
HCT: 36.2 % (ref 36.0–46.0)
Hemoglobin: 12.2 g/dL (ref 12.0–15.0)
MCHC: 33.7 g/dL (ref 30.0–36.0)
MCV: 89.2 fl (ref 78.0–100.0)
Platelets: 313 10*3/uL (ref 150.0–400.0)
RBC: 4.06 Mil/uL (ref 3.87–5.11)
RDW: 14.1 % (ref 11.5–15.5)
WBC: 6.9 10*3/uL (ref 4.0–10.5)

## 2016-06-18 LAB — T4, FREE: Free T4: 1.04 ng/dL (ref 0.60–1.60)

## 2016-06-18 LAB — TSH: TSH: 0.94 u[IU]/mL (ref 0.35–4.50)

## 2016-06-18 LAB — BRAIN NATRIURETIC PEPTIDE: Pro B Natriuretic peptide (BNP): 115 pg/mL — ABNORMAL HIGH (ref 0.0–100.0)

## 2016-06-18 MED ORDER — TRAMADOL HCL 50 MG PO TABS: ORAL_TABLET | ORAL | 3 refills | Status: DC

## 2016-06-18 NOTE — Assessment & Plan Note (Addendum)
BP is up since amlodipine is 1/2 daily. Will increase back to 1 pill daily. Continue losartan, lasix, metoprolol as well. Placed referral to geriatrics clinic for consult.

## 2016-06-18 NOTE — Patient Instructions (Signed)
We are checking the labs today and will call you back with the results.   My thoughts and prayers are with you and Ed.   We will also get you in with the geriatrics clinic at [facility] to talk to them a well.

## 2016-06-18 NOTE — Assessment & Plan Note (Signed)
Checking TSH and free T4 after 1 month of synthroid 50 mcg daily.

## 2016-06-18 NOTE — Progress Notes (Signed)
   Subjective:    Patient ID: [name], female    DOB: 02/13/1941, 75 y.o.   MRN: [id number]  HPI The patient is a 75 YO female coming in for follow up of the edema in her feet, hands, and face over the last several months. She had seen someone who halved her amlodipine dose. This has not changed the swelling at all but her blood pressure has been much higher since that time. She is having some mild headaches intermittent at home. She is noticing some swelling which worsens throughout the day. She has also heard about geriatricians and wants to consult with one to talk through some of her concerns about aging with an expert. Under a lot of stress as her husband has been recently diagnosed with cancer of his tongue and in the LN of his neck.  Review of Systems  Constitutional: Positive for fatigue. Negative for activity change, appetite change, chills, fever and unexpected weight change.  HENT: Negative.   Eyes: Negative.   Respiratory: Negative.   Cardiovascular: Positive for leg swelling. Negative for chest pain and palpitations.  Gastrointestinal: Negative.   Musculoskeletal: Negative for arthralgias, back pain, gait problem and myalgias.  Skin: Negative.   Neurological: Negative.   Psychiatric/Behavioral: Positive for sleep disturbance.      Objective:   Physical Exam  Constitutional: She is oriented to person, place, and time. She appears well-developed and well-nourished.  HENT:  Head: Normocephalic and atraumatic.  Eyes: EOM are normal.  Neck: Normal range of motion.  Cardiovascular: Normal rate and regular rhythm.   Pulmonary/Chest: Effort normal and breath sounds normal. No respiratory distress. She has no wheezes. She has no rales.  Abdominal: Soft. She exhibits no distension. There is no tenderness. There is no rebound.  Musculoskeletal: She exhibits no edema.  No real edema on exam in her hands or ankles  Neurological: She is alert and oriented to person, place, and  time.  Skin: Skin is warm and dry.   Vitals:   06/18/16 1117  BP: (!) 150/78  Pulse: 61  Resp: 12  Temp: 98 F (36.7 C)  TempSrc: Oral  SpO2: 98%  Weight: 137 lb (62.1 kg)  Height: 5\' 2"  (1.575 m)      Assessment & Plan:

## 2016-06-18 NOTE — Progress Notes (Signed)
Pre visit review using our clinic review tool, if applicable. No additional management support is needed unless otherwise documented below in the visit note. 

## 2016-06-18 NOTE — Assessment & Plan Note (Signed)
Checking CMP, CBC, BNP to check for cause. Treat as appropriate. She does have compression stockings which she wears while home.

## 2016-06-19 ENCOUNTER — Other Ambulatory Visit: Payer: Self-pay | Admitting: Internal Medicine

## 2016-06-19 DIAGNOSIS — R0609 Other forms of dyspnea: Principal | ICD-10-CM

## 2016-06-19 NOTE — Telephone Encounter (Signed)
Faxed to pharmacy

## 2016-06-28 ENCOUNTER — Telehealth: Payer: Self-pay | Admitting: Internal Medicine

## 2016-06-28 NOTE — Telephone Encounter (Signed)
Noted  

## 2016-06-28 NOTE — Telephone Encounter (Signed)
Patient has echocardiogram coming up.   Patient is requesting to be referred to Dr. Burt Knack if she has to be referred to a cardiologist b/c patient's spouse is already a patient of Dr. Burt Knack.

## 2016-07-09 ENCOUNTER — Other Ambulatory Visit: Payer: Self-pay

## 2016-07-09 ENCOUNTER — Ambulatory Visit (HOSPITAL_COMMUNITY): Payer: Medicare Other | Attending: Internal Medicine

## 2016-07-09 DIAGNOSIS — Z8249 Family history of ischemic heart disease and other diseases of the circulatory system: Secondary | ICD-10-CM | POA: Diagnosis not present

## 2016-07-09 DIAGNOSIS — R0600 Dyspnea, unspecified: Secondary | ICD-10-CM | POA: Diagnosis present

## 2016-07-09 DIAGNOSIS — I340 Nonrheumatic mitral (valve) insufficiency: Secondary | ICD-10-CM | POA: Diagnosis not present

## 2016-07-09 DIAGNOSIS — I071 Rheumatic tricuspid insufficiency: Secondary | ICD-10-CM | POA: Insufficient documentation

## 2016-07-09 DIAGNOSIS — R0609 Other forms of dyspnea: Secondary | ICD-10-CM | POA: Diagnosis not present

## 2016-07-10 ENCOUNTER — Other Ambulatory Visit: Payer: Self-pay | Admitting: Internal Medicine

## 2016-07-25 ENCOUNTER — Other Ambulatory Visit: Payer: Self-pay | Admitting: Internal Medicine

## 2016-07-25 NOTE — Telephone Encounter (Signed)
MD ok refill faxed [facility]...Johny Chess

## 2016-07-25 NOTE — Telephone Encounter (Signed)
Routing to dr crawford, please advise, thanks 

## 2016-07-29 ENCOUNTER — Other Ambulatory Visit: Payer: Self-pay | Admitting: Internal Medicine

## 2016-07-30 NOTE — Telephone Encounter (Signed)
Faxed to pharmacy

## 2016-08-13 ENCOUNTER — Ambulatory Visit: Payer: Medicare Other | Admitting: Internal Medicine

## 2016-08-21 ENCOUNTER — Telehealth: Payer: Self-pay | Admitting: Internal Medicine

## 2016-08-26 NOTE — Telephone Encounter (Signed)
Pt is rq a rf. Do you know if it got faxed to pt pharmacy.

## 2016-08-26 NOTE — Telephone Encounter (Signed)
Rx rf faxed

## 2016-09-07 ENCOUNTER — Other Ambulatory Visit: Payer: Self-pay | Admitting: Internal Medicine

## 2016-09-09 ENCOUNTER — Other Ambulatory Visit: Payer: Self-pay | Admitting: Internal Medicine

## 2016-09-16 ENCOUNTER — Ambulatory Visit: Payer: Self-pay | Admitting: Nurse Practitioner

## 2016-10-13 ENCOUNTER — Other Ambulatory Visit: Payer: Self-pay | Admitting: Internal Medicine

## 2016-10-21 ENCOUNTER — Ambulatory Visit: Payer: Medicare Other | Admitting: Nurse Practitioner

## 2016-10-24 ENCOUNTER — Other Ambulatory Visit: Payer: Self-pay | Admitting: Internal Medicine

## 2016-10-29 ENCOUNTER — Ambulatory Visit: Payer: Medicare Other | Admitting: Nurse Practitioner

## 2016-11-07 ENCOUNTER — Encounter: Payer: Self-pay | Admitting: Nurse Practitioner

## 2016-11-07 ENCOUNTER — Ambulatory Visit (INDEPENDENT_AMBULATORY_CARE_PROVIDER_SITE_OTHER): Payer: Medicare Other | Admitting: Nurse Practitioner

## 2016-11-07 ENCOUNTER — Other Ambulatory Visit: Payer: Self-pay | Admitting: Nurse Practitioner

## 2016-11-07 VITALS — BP 124/78 | HR 60 | Temp 97.6°F | Resp 18 | Ht 62.0 in | Wt 137.0 lb

## 2016-11-07 DIAGNOSIS — E039 Hypothyroidism, unspecified: Secondary | ICD-10-CM | POA: Diagnosis not present

## 2016-11-07 DIAGNOSIS — M797 Fibromyalgia: Secondary | ICD-10-CM

## 2016-11-07 DIAGNOSIS — I10 Essential (primary) hypertension: Secondary | ICD-10-CM

## 2016-11-07 DIAGNOSIS — G4700 Insomnia, unspecified: Secondary | ICD-10-CM

## 2016-11-07 DIAGNOSIS — R609 Edema, unspecified: Secondary | ICD-10-CM | POA: Diagnosis not present

## 2016-11-07 LAB — TSH: TSH: 1.61 mIU/L

## 2016-11-07 MED ORDER — DULOXETINE HCL 30 MG PO CPEP: 30.0000 mg | ORAL_CAPSULE | Freq: Every day | ORAL | 1 refills | Status: DC

## 2016-11-07 MED ORDER — AMLODIPINE BESYLATE 5 MG PO TABS: 5.0000 mg | ORAL_TABLET | Freq: Every day | ORAL | Status: DC

## 2016-11-07 MED ORDER — OMEPRAZOLE 40 MG PO CPDR: 40.0000 mg | DELAYED_RELEASE_CAPSULE | Freq: Every day | ORAL | 3 refills | Status: DC

## 2016-11-07 NOTE — Progress Notes (Signed)
Careteam: Patient Care Team: Hoyt Koch, MD as PCP - General (Internal Medicine) Raynelle Bring, MD as Consulting Physician (Urology) Irene Shipper, MD as Consulting Physician (Gastroenterology) Rutherford Guys, MD as Consulting Physician (Ophthalmology) Newman Pies, MD (Neurosurgery)  Advanced Directive information Does Patient Have a Medical Advance Directive?: Yes, Type of Advance Directive: [place];Living will  Allergies  Allergen Reactions  . Morphine And Related Itching and Rash    Given in IV.    Chief Complaint  Patient presents with  . New Patient (Initial Visit)    Pt is being seen to establish care for chronic medical conditions.   . Medical Management of Chronic Issues     HPI: Patient is a 76 y.o. female seen in the office today to establish care. Previously seen at [facility] and Dr Linda Hedges retired. She was bounced around from several providers but the ones she wanted to establish with are not taking new patients.  Missed several appts due to her husband has throat cancer with mets to neck. Gone through several weeks of radiation, things have gotten worse (dehydration, feeding tube, several appts) but now improving.  Over due for a physical.  htn- taking norvasc, lasix, cozaar, metoprolol   Back surgery, has advanced DDD- not going to operate- taking tramadol to help with pain, taking TID occasionally will need to take 2 tablets at once due to acute pain but this is rare.   Partial nephrectomy in 2014 by Dr Dutch Gray, following up in June for final follow up.   Very aware of her weight, both parents were obese and brother was obese. Never been over 140 lbs. Reports her husband says she is obsessed over her weight.   Ongoing swelling of ankles and feet on her trip to [place].  Did an echo, LE doppler to rule out DVT Was instructed to reduce sodium, make sure she is walking/staying active.  Swelling comes and goes.   Would like her  thyroid checked, recently diagnosed.    Review of Systems: Review of Systems  Constitutional: Negative for activity change, appetite change, fatigue and unexpected weight change.  HENT: Negative for congestion and hearing loss.   Eyes: Negative.   Respiratory: Negative for cough and shortness of breath.   Cardiovascular: Negative for chest pain, palpitations and leg swelling.  Gastrointestinal: Negative for abdominal pain, constipation and diarrhea.  Genitourinary: Negative for difficulty urinating and dysuria.  Musculoskeletal: Negative for arthralgias and myalgias.  Skin: Negative for color change and wound.  Neurological: Positive for headaches (due to bulging disc in her neck, took mobic and headaches improved). Negative for dizziness and weakness.  Psychiatric/Behavioral: Negative for agitation, behavioral problems and confusion.    Past Medical History:  Diagnosis Date  . Cancer ([place])    kidney  . Esophageal stricture   . Fibromyalgia   . GERD (gastroesophageal reflux disease)   . Helicobacter pylori gastritis   . Hypertension   . Intention tremor   . Lumbago   . Osteoarthritis of spine   . Pneumonia    hx of years ago   . PUD (peptic ulcer disease)   . PVD (peripheral vascular disease) ([place])    Past Surgical History:  Procedure Laterality Date  . ABDOMINAL HYSTERECTOMY  1985  . BREAST ENHANCEMENT SURGERY  1974  . CATARACT EXTRACTION Bilateral 2014   [facility]  . LAPAROSCOPIC PARTIAL NEPHRECTOMY Left April '13   [facility], Dr. Alinda Money  . LUMBAR FUSION  03/2003   L4-5  .  TONSILLECTOMY  1958  . TUBAL LIGATION  1984   Social History:   reports that she has never smoked. She has never used smokeless tobacco. She reports that she drinks alcohol. She reports that she does not use drugs.  Family History  Problem Relation Age of Onset  . Heart disease Mother   . COPD Mother   . Thyroid disease Mother   . Diabetes Father   . Heart disease Father     CABG  . Stroke Father    . Parkinson's disease Father   . Heart disease Brother   . Stroke Brother   . Diabetes Daughter   . Hearing loss Brother   . Crohn's disease Other     neice  . Hypertension Son   . Diabetes Son   . Melanoma Son   . Colon cancer Neg Hx   . Stomach cancer Neg Hx   . Esophageal cancer Neg Hx     Medications: Patient's Medications  New Prescriptions   No medications on file  Previous Medications   ACETAMINOPHEN (TYLENOL) 500 MG TABLET    Take 2,000 mg by mouth daily.   ALPRAZOLAM (XANAX) 0.5 MG TABLET    TAKE 1 TABLET BY MOUTH TWICE DAILY AS NEEDED FOR ANXIETY   AMITRIPTYLINE (ELAVIL) 25 MG TABLET    TAKE 1 TABLET BY MOUTH EVERY DAY AT BEDTIME   AMLODIPINE (NORVASC) 5 MG TABLET    Take 0.5 tablets (2.5 mg total) by mouth daily.   CALCIUM-VITAMIN D (OSCAL WITH D) 500-200 MG-UNIT PER TABLET    Take 1 tablet by mouth daily.   CHOLECALCIFEROL (VITAMIN D) 1000 UNITS TABLET    Take 1,000 Units by mouth daily.   FUROSEMIDE (LASIX) 20 MG TABLET    Take 1 tablet (20 mg total) by mouth daily.   LEVOTHYROXINE (SYNTHROID, LEVOTHROID) 50 MCG TABLET    Take 1 tablet (50 mcg total) by mouth daily.   LOSARTAN (COZAAR) 100 MG TABLET    Take 1 tablet (100 mg total) by mouth daily.   METOPROLOL SUCCINATE (TOPROL-XL) 50 MG 24 HR TABLET    Take 1 tablet (50 mg total) by mouth daily. Take with or immediately following a meal.   MULTIPLE VITAMINS-MINERALS (MULTIVITAMIN WITH MINERALS) TABLET    Take 1 tablet by mouth daily.   OMEPRAZOLE (PRILOSEC) 40 MG CAPSULE    TAKE 1 CAPSULE BY MOUTH TWICE DAILY   OVER THE COUNTER MEDICATION    Tumeric 1 capsule twice daily.   POLYETHYLENE GLYCOL POWDER (GLYCOLAX/MIRALAX) POWDER    DISSOLVE 17 GRAMS INTO LIQUID AND DRINK BY MOUTH EVERY DAY   TRAMADOL (ULTRAM) 50 MG TABLET    TAKE 1 TABLET BY MOUTH EVERY 8 HOURS AS NEEDED FOR MODERATE TO SEVERE PAIN. MAY TAKE 1 ADDITIONAL TABLET DAILY AS NEEDED FOR SEVERE PAIN  Modified Medications   No medications on file    Discontinued Medications   MELATONIN 3 MG TABS    Take 6 mg by mouth at bedtime.   SACCHAROMYCES BOULARDII (FLORASTOR) 250 MG CAPSULE    Take 1 capsule (250 mg total) by mouth 2 (two) times daily.   SUCRALFATE (CARAFATE) 1 G TABLET    Take as crushed tablet in water (suspension) before meals and bedtime     Physical Exam:  Vitals:   11/07/16 0832  BP: 124/78  Pulse: 60  Resp: 18  Temp: 97.6 F (36.4 C)  TempSrc: Oral  SpO2: 99%  Weight: 137 lb (62.1 kg)  Height: 5'  2" (1.575 m)   Body mass index is 25.06 kg/m.  Physical Exam  Constitutional: She is oriented to person, place, and time. She appears well-developed and well-nourished. No distress.  HENT:  Head: Normocephalic and atraumatic.  Mouth/Throat: Oropharynx is clear and moist. No oropharyngeal exudate.  Eyes: Conjunctivae are normal. Pupils are equal, round, and reactive to light.  Neck: Normal range of motion. Neck supple.  Cardiovascular: Normal rate, regular rhythm and normal heart sounds.   Pulmonary/Chest: Effort normal and breath sounds normal.  Abdominal: Soft. Bowel sounds are normal.  Musculoskeletal: She exhibits no edema or tenderness.  Neurological: She is alert and oriented to person, place, and time.  Skin: Skin is warm and dry. She is not diaphoretic.  Psychiatric: She has a normal mood and affect.    Labs reviewed: Basic Metabolic Panel:  Recent Labs  12/24/15 0931 05/06/16 1708 06/18/16 1212  NA 139 138 137  K 4.4 4.4 4.3  CL 103 101 100  CO2 25 29 30   GLUCOSE 102* 100* 89  BUN 7 19 20   CREATININE 0.69 0.89 0.83  CALCIUM 9.4 10.0 10.1  TSH  --  4.87* 0.94   Liver Function Tests:  Recent Labs  12/24/15 0931 05/06/16 1708 06/18/16 1212  AST 26 26 22   ALT 24 18 15   ALKPHOS 76 70 62  BILITOT 0.5 0.3 0.5  PROT 7.1 8.2 7.6  ALBUMIN 3.6 4.7 4.7    Recent Labs  12/24/15 0931  LIPASE 39   No results for input(s): AMMONIA in the last 8760 hours. CBC:  Recent Labs   12/24/15 0931 05/06/16 1708 06/18/16 1212  WBC 10.7* 6.4 6.9  NEUTROABS  --  3.3  --   HGB 11.5* 12.5 12.2  HCT 35.2* 37.1 36.2  MCV 91.7 88.4 89.2  PLT 361 348.0 313.0   Lipid Panel: No results for input(s): CHOL, HDL, LDLCALC, TRIG, CHOLHDL, LDLDIRECT in the last 8760 hours. TSH:  Recent Labs  05/06/16 1708 06/18/16 1212  TSH 4.87* 0.94   A1C: Lab Results  Component Value Date   HGBA1C 5.8 05/04/2009     Assessment/Plan 1. HTN (hypertension), benign -blood pressure stable on norvasc, cozaar, toprol and lasix.   2. Hypothyroidism, unspecified type -conts on synthroid 50 mcg daily  - TSH  3. Fibromyalgia -to stop amitriptyline and start cymbalta to see if better results for chronic pain and anxiety.  -conts on tylenol twice daily and tramadol -encouraged to use tramadol as needed only  - DULoxetine (CYMBALTA) 30 MG capsule; Take 1 capsule (30 mg total) by mouth daily.  Dispense: 30 capsule; Refill: 1  4. Insomnia, unspecified type To stop using xanax for sleep, to cut back to 1/2 tablet qhs x 1 week then as needed, may just melatonin 5 mg qhs PRN  5. Swelling In lower extremities, educated on venous insuffiencey which is most likely cause of swelling along with norvasc  To follow up in 4 weeks for EV and to follow up on pain, anxiety, insomnia.  Carlos American. Harle Battiest  [facility] [phone] 8 am - 5 pm) [phone] (after hours)

## 2016-11-07 NOTE — Patient Instructions (Addendum)
Take 1/2 tablet of xanax at bedtime x 1 week then use AS NEEDED only; can try Melatonin 5 mg at bedtime if needed STOP amitriptyline Start cymbalta 30 mg daily  See if you can minimize tramadol use once you start cymbalta

## 2016-11-09 ENCOUNTER — Telehealth: Payer: Self-pay | Admitting: Internal Medicine

## 2016-11-13 NOTE — Telephone Encounter (Signed)
Patient states that was not happy with the office she seen with [facility].  She states she is still going to stay with Dr. Sharlet Salina.  Patient is requesting Dr. Sharlet Salina to refill medication.  Please follow up with patient in regard.

## 2016-11-14 ENCOUNTER — Encounter: Payer: Self-pay | Admitting: Internal Medicine

## 2016-11-14 ENCOUNTER — Ambulatory Visit (INDEPENDENT_AMBULATORY_CARE_PROVIDER_SITE_OTHER): Payer: Medicare Other | Admitting: Internal Medicine

## 2016-11-14 DIAGNOSIS — I10 Essential (primary) hypertension: Secondary | ICD-10-CM | POA: Diagnosis not present

## 2016-11-14 DIAGNOSIS — F419 Anxiety disorder, unspecified: Secondary | ICD-10-CM

## 2016-11-14 MED ORDER — ALPRAZOLAM 0.5 MG PO TABS: 0.5000 mg | ORAL_TABLET | Freq: Two times a day (BID) | ORAL | 1 refills | Status: DC | PRN

## 2016-11-14 NOTE — Telephone Encounter (Signed)
I am not taking new patients right now unfortunately

## 2016-11-14 NOTE — Progress Notes (Signed)
Subjective:  Patient ID: [name], female    DOB: 04/18/1941  Age: 76 y.o. MRN: [id number]  CC: Medication Refill (Pt stated medication refill)   HPI   [name] presents for anxiety - she ran out of Xanax. The pt was in a loud emotional distress at the front and she was w/in my schedule today.  She is very stressed w/her husband's cancer. She will start w/Dr Forde Dandy as her PCP on May 11th.  Outpatient Medications Prior to Visit  Medication Sig Dispense Refill  . acetaminophen (TYLENOL) 500 MG tablet Take 1,000 mg by mouth 2 (two) times daily.     Marland Kitchen ALPRAZolam (XANAX) 0.5 MG tablet TAKE 1 TABLET BY MOUTH TWICE DAILY AS NEEDED FOR ANXIETY 60 tablet 2  . amitriptyline (ELAVIL) 25 MG tablet TAKE 1 TABLET BY MOUTH EVERY DAY AT BEDTIME 90 tablet 3  . amLODipine (NORVASC) 5 MG tablet Take 1 tablet (5 mg total) by mouth daily.    . calcium-vitamin D (OSCAL WITH D) 500-200 MG-UNIT per tablet Take 1 tablet by mouth daily.    . cholecalciferol (VITAMIN D) 1000 UNITS tablet Take 1,000 Units by mouth daily.    . DULoxetine (CYMBALTA) 30 MG capsule TAKE 1 CAPSULE(30 MG) BY MOUTH DAILY 90 capsule 1  . furosemide (LASIX) 20 MG tablet Take 1 tablet (20 mg total) by mouth daily. 90 tablet 3  . levothyroxine (SYNTHROID, LEVOTHROID) 50 MCG tablet Take 1 tablet (50 mcg total) by mouth daily. 90 tablet 3  . losartan (COZAAR) 100 MG tablet Take 1 tablet (100 mg total) by mouth daily. 90 tablet 3  . metoprolol succinate (TOPROL-XL) 50 MG 24 hr tablet Take 1 tablet (50 mg total) by mouth daily. Take with or immediately following a meal. 90 tablet 3  . Multiple Vitamins-Minerals (MULTIVITAMIN WITH MINERALS) tablet Take 1 tablet by mouth daily.    Marland Kitchen omeprazole (PRILOSEC) 40 MG capsule Take 1 capsule (40 mg total) by mouth daily. 90 capsule 3  . OVER THE COUNTER MEDICATION Tumeric 1 capsule daily.    . polyethylene glycol powder (GLYCOLAX/MIRALAX) powder DISSOLVE 17 GRAMS INTO LIQUID AND DRINK BY MOUTH  EVERY DAY 527 g 0  . traMADol (ULTRAM) 50 MG tablet TAKE 1 TABLET BY MOUTH EVERY 8 HOURS AS NEEDED FOR MODERATE TO SEVERE PAIN. MAY TAKE 1 ADDITIONAL TABLET DAILY AS NEEDED FOR SEVERE PAIN 100 tablet 0   No facility-administered medications prior to visit.     ROS Review of Systems  Constitutional: Negative for activity change, appetite change, chills, fatigue and unexpected weight change.  HENT: Negative for congestion, mouth sores and sinus pressure.   Eyes: Negative for visual disturbance.  Respiratory: Negative for cough and chest tightness.   Gastrointestinal: Negative for abdominal pain and nausea.  Genitourinary: Negative for difficulty urinating, frequency and vaginal pain.  Musculoskeletal: Negative for back pain and gait problem.  Skin: Negative for pallor and rash.  Neurological: Negative for dizziness, tremors, weakness, numbness and headaches.  Psychiatric/Behavioral: Positive for dysphoric mood. Negative for confusion, sleep disturbance and suicidal ideas. The patient is nervous/anxious.     Objective:  BP 138/78   Pulse 86   Temp 97.6 F (36.4 C)   SpO2 99%   BP Readings from Last 3 Encounters:  11/14/16 138/78  11/07/16 124/78  06/18/16 (!) 142/70    Wt Readings from Last 3 Encounters:  11/07/16 137 lb (62.1 kg)  06/18/16 137 lb (62.1 kg)  05/17/16 138 lb (62.6 kg)  Physical Exam  Constitutional: She appears well-developed. No distress.  HENT:  Head: Normocephalic.  Right Ear: External ear normal.  Left Ear: External ear normal.  Nose: Nose normal.  Mouth/Throat: Oropharynx is clear and moist.  Eyes: Conjunctivae are normal. Pupils are equal, round, and reactive to light. Right eye exhibits no discharge. Left eye exhibits no discharge.  Neck: Normal range of motion. Neck supple. No JVD present. No tracheal deviation present. No thyromegaly present.  Cardiovascular: Normal rate, regular rhythm and normal heart sounds.   Pulmonary/Chest: No stridor.  No respiratory distress. She has no wheezes.  Abdominal: Soft. Bowel sounds are normal. She exhibits no distension and no mass. There is no tenderness. There is no rebound and no guarding.  Musculoskeletal: She exhibits no edema or tenderness.  Lymphadenopathy:    She has no cervical adenopathy.  Neurological: She displays normal reflexes. No cranial nerve deficit. She exhibits normal muscle tone. Coordination normal.  Skin: No rash noted. No erythema.  Psychiatric: Her behavior is normal. Judgment and thought content normal.  anxious, tearful  FTF>20 min discussing the pt's stress  Lab Results  Component Value Date   WBC 6.9 06/18/2016   HGB 12.2 06/18/2016   HCT 36.2 06/18/2016   PLT 313.0 06/18/2016   GLUCOSE 89 06/18/2016   CHOL 191 04/27/2015   TRIG 109.0 04/27/2015   HDL 64.30 04/27/2015   LDLCALC 105 (H) 04/27/2015   ALT 15 06/18/2016   AST 22 06/18/2016   NA 137 06/18/2016   K 4.3 06/18/2016   CL 100 06/18/2016   CREATININE 0.83 06/18/2016   BUN 20 06/18/2016   CO2 30 06/18/2016   TSH 1.61 11/07/2016   HGBA1C 5.8 05/04/2009   MICROALBUR <0.7 10/25/2015    No results found.  Assessment & Plan:   There are no diagnoses linked to this encounter. I am having Ms. Baranski maintain her multivitamin with minerals, calcium-vitamin D, cholecalciferol, OVER THE COUNTER MEDICATION, metoprolol succinate, furosemide, acetaminophen, losartan, levothyroxine, ALPRAZolam, polyethylene glycol powder, amitriptyline, traMADol, amLODipine, omeprazole, and DULoxetine.  No orders of the defined types were placed in this encounter.    Follow-up: No Follow-up on file.  [name], MD

## 2016-11-14 NOTE — Assessment & Plan Note (Signed)
Amlodipine, Toprol, Furosemide

## 2016-11-14 NOTE — Assessment & Plan Note (Addendum)
Situational anxiety Discussed with pt Xanax renewed   Potential benefits of a long term benzodiazepines  use as well as potential risks  and complications were explained to the patient and were aknowledged. FTF>20 min Psychology visits adviced

## 2016-11-15 ENCOUNTER — Telehealth: Payer: Self-pay

## 2016-11-15 NOTE — Telephone Encounter (Signed)
I called patient to verify PCP, patient states she does not feel like [facility] is a good fit for her. Patient was wishing to establish with Dr.Green who will be retiring in June 2018.

## 2016-11-22 ENCOUNTER — Other Ambulatory Visit: Payer: Self-pay | Admitting: Internal Medicine

## 2016-11-28 ENCOUNTER — Telehealth: Payer: Self-pay | Admitting: Internal Medicine

## 2016-11-28 NOTE — Telephone Encounter (Signed)
Error

## 2016-11-28 NOTE — Telephone Encounter (Signed)
Pt is requesting a refill on her Tramadol, [facility]/[address]/[place]  Pt did see [facility] but is NOT establishing care with them. She is set for a NP appt to establish with [facility] on 12/20/16, just need meds til that appt.

## 2016-11-28 NOTE — Telephone Encounter (Signed)
Since the patient has left our practice and established care with a new provider (even if she elected not to stay with them) we cannot do controlled substances for this patient. She does have the option of going to an urgent care location if we needs medication before establishing with another new provider.

## 2016-11-29 ENCOUNTER — Other Ambulatory Visit: Payer: Self-pay | Admitting: Internal Medicine

## 2016-11-29 NOTE — Telephone Encounter (Signed)
Called patient. Explained we cannot refill the controlled substance, recommended going to an Urgent Care to carry her over until her new provider visit with [facility] on 12/20/16. Pt understood.

## 2017-01-07 ENCOUNTER — Other Ambulatory Visit: Payer: Self-pay | Admitting: Internal Medicine

## 2017-01-14 ENCOUNTER — Other Ambulatory Visit (HOSPITAL_COMMUNITY): Payer: Self-pay | Admitting: Urology

## 2017-01-14 ENCOUNTER — Ambulatory Visit (HOSPITAL_COMMUNITY)
Admission: RE | Admit: 2017-01-14 | Discharge: 2017-01-14 | Disposition: A | Discharge status: Home / Self Care | Payer: Medicare Other | Source: Ambulatory Visit | Attending: Urology | Admitting: Urology

## 2017-01-14 ENCOUNTER — Other Ambulatory Visit: Payer: Self-pay | Admitting: Internal Medicine

## 2017-01-14 ENCOUNTER — Other Ambulatory Visit: Payer: Self-pay | Admitting: Surgery

## 2017-01-14 DIAGNOSIS — Z85528 Personal history of other malignant neoplasm of kidney: Secondary | ICD-10-CM | POA: Diagnosis present

## 2017-01-16 ENCOUNTER — Ambulatory Visit (HOSPITAL_COMMUNITY): Payer: Medicare Other | Admitting: Psychiatry

## 2017-01-27 ENCOUNTER — Encounter: Payer: Self-pay | Admitting: Physical Therapy

## 2017-01-27 ENCOUNTER — Ambulatory Visit: Payer: Medicare Other | Attending: Endocrinology | Admitting: Physical Therapy

## 2017-01-27 DIAGNOSIS — M62830 Muscle spasm of back: Secondary | ICD-10-CM | POA: Diagnosis present

## 2017-01-27 DIAGNOSIS — M545 Low back pain, unspecified: Secondary | ICD-10-CM

## 2017-01-27 DIAGNOSIS — M5136 Other intervertebral disc degeneration, lumbar region: Secondary | ICD-10-CM | POA: Diagnosis present

## 2017-01-27 NOTE — Therapy (Signed)
[facility] [address] [place], [place], [address] Phone: [phone]   Fax:  [phone]  Physical Therapy Evaluation  Patient Details  Name: [name] MRN: [id number] Date of Birth: Apr 13, 1941 Referring Provider: Reynold Bowen  Encounter Date: 01/27/2017      PT End of Session - 01/27/17 0951    Visit Number 1   Date for PT Re-Evaluation 03/29/17   PT Start Time 0929   PT Stop Time 1019   PT Time Calculation (min) 50 min   Activity Tolerance Patient tolerated treatment well   Behavior During Therapy [facility] for tasks assessed/performed      Past Medical History:  Diagnosis Date  . Cancer ([place]) 2014   kidney  . Esophageal stricture   . Fibromyalgia   . GERD (gastroesophageal reflux disease)   . Helicobacter pylori gastritis   . Hypertension   . Intention tremor   . Lumbago   . Osteoarthritis of spine   . Pneumonia    hx of years ago   . PUD (peptic ulcer disease)   . PVD (peripheral vascular disease) ([place])     Past Surgical History:  Procedure Laterality Date  . ABDOMINAL HYSTERECTOMY  1985  . BREAST ENHANCEMENT SURGERY  1974  . CATARACT EXTRACTION Bilateral 2014   shapiro  . LAPAROSCOPIC PARTIAL NEPHRECTOMY Left April '13   [facility], Dr. Alinda Money  . LUMBAR FUSION  03/2003   L4-5  . TONSILLECTOMY  1958  . TUBAL LIGATION  1984    There were no vitals filed for this visit.       Subjective Assessment - 01/27/17 0930    Subjective Patient reports about a month ago she was using a shop vac and that started her back pain, she also reports that her husband has been battling CA and she has not done any exercises over the past 6 months.  She has had issues with her back for a number of years.  Had a lumbar fusion in 2004.    X-rays show DDD.     Limitations House hold activities;Lifting   Patient Stated Goals have less pain, sleep better   Currently in Pain? Yes   Pain Score 5    Pain Location Back    Pain Orientation Lower   Pain Descriptors / Indicators Aching;Sore   Pain Type Acute pain   Pain Onset More than a month ago   Pain Frequency Constant   Aggravating Factors  lying down increases pain up to 8/1o   Pain Relieving Factors movements pain at best is a 5/10   Effect of Pain on Daily Activities limits walking, exercises and sleep            [facility] PT Assessment - 01/27/17 0001      Assessment   Medical Diagnosis low back pain   Referring Provider Reynold Bowen   Onset Date/Surgical Date 12/27/16   Prior Therapy about 18 months ago for LBP     Precautions   Precautions None     Balance Screen   Has the patient fallen in the past 6 months No   Has the patient had a decrease in activity level because of a fear of falling?  No   Is the patient reluctant to leave their home because of a fear of falling?  No     Home Environment   Additional Comments some housework, some flower garden     Prior Function   Level  of Independence Independent   Vocation Retired   Leisure has not been able to walk over the past 6-8 months, that is what her normal exercise was     Posture/Postural Control   Posture Comments fwd head and rounded shoulders     AROM   Overall AROM Comments Lumbar ROM decreased 50% with mild increase of pain     Strength   Overall Strength Comments strength of the LE's 4-/5     Flexibility   Soft Tissue Assessment /Muscle Length --  mild tightness in the HS, calves and piriformis mms     Palpation   Palpation comment tight and tender in the thoracic and lumbar parapsinals, some tenderness in the buttocks     Ambulation/Gait   Gait Comments mild antalgic gait on the right, with c/o pain in the low back.            Objective measurements completed on examination: See above findings.          [facility] Adult PT Treatment/Exercise - 01/27/17 0001      Modalities   Modalities Electrical Stimulation;Moist Heat     Moist Heat Therapy    Number Minutes Moist Heat 15 Minutes   Moist Heat Location Lumbar Spine     Electrical Stimulation   Electrical Stimulation Location lumbar   Electrical Stimulation Action IFC   Electrical Stimulation Parameters supine   Electrical Stimulation Goals Pain                PT Education - 01/27/17 0950    Education provided Yes   Education Details Wms Flexion   Person(s) Educated Patient   Methods Explanation;Demonstration;Handout   Comprehension Verbalized understanding          PT Short Term Goals - 01/27/17 0955      PT SHORT TERM GOAL #1   Title independent iwth initial HEP   Time 2   Period Weeks   Status New           PT Long Term Goals - 01/27/17 0955      PT LONG TERM GOAL #1   Title decrease pain 50%   Time 8   Period Weeks   Status New     PT LONG TERM GOAL #2   Title understand proper posture and body mechanics    Time 8   Period Weeks   Status New     PT LONG TERM GOAL #3   Title increase lumbar ROM 25%   Time 8   Period Weeks   Status New     PT LONG TERM GOAL #4   Title report sleeping 50% better   Time 8   Period Weeks   Status New                Plan - 01/27/17 8295    Clinical Impression Statement Patient reports that about a month ago she had to use a wet - dry vac and felt pain in her back, she has a history of lumbar fusion in 2004, and some back pain since that time, x-rays show DDD.  She reports that recently she has also been taking care of her husband who has been diagnosed with cancer and she has been unable to do any exercises and has had to do more around the house.  Her lumbar ROM is decreased 50%, her lumbar mms are very tight and tender with tenderness into the buttocks   History and Personal Factors relevant to  plan of care: past lumbar fusion   Clinical Presentation Evolving   Clinical Decision Making Low   Rehab Potential Good   PT Frequency 1x / week   PT Duration 8 weeks   PT Treatment/Interventions  Electrical Stimulation;Moist Heat;Therapeutic exercise;Manual techniques;Therapeutic activities;Patient/family education;Ultrasound;Traction;ADLs/Self Care Home Management   PT Next Visit Plan Will progress with gym exercises for core strength.  She had stopped PT in the past due to copay issues   Consulted and Agree with Plan of Care Patient      Patient will benefit from skilled therapeutic intervention in order to improve the following deficits and impairments:  Decreased range of motion, Decreased strength, Pain, Increased muscle spasms, Impaired flexibility, Improper body mechanics  Visit Diagnosis: Acute bilateral low back pain without sciatica - Plan: PT plan of care cert/re-cert  Muscle spasm of back - Plan: PT plan of care cert/re-cert      G-Codes - 58/83/25 4982    Functional Assessment Tool Used (Outpatient Only) Foto 52% limitation   Functional Limitation Other PT primary   Other PT Primary Current Status (M4158) At least 40 percent but less than 60 percent impaired, limited or restricted   Other PT Primary Goal Status (X0940) At least 20 percent but less than 40 percent impaired, limited or restricted       Problem List Patient Active Problem List   Diagnosis Date Noted  . Anxiety 11/14/2016  . Swelling 06/18/2016  . Hypothyroidism 05/17/2016  . Abnormal CT scan 12/28/2015  . Chronic low back pain 04/13/2015  . Cervical disc disorder with radiculopathy of cervical region 06/20/2014  . Bursitis of right shoulder 05/04/2014  . Insomnia 04/21/2013  . Routine health maintenance 01/28/2013  . HTN (hypertension), benign 01/09/2012  . Memory loss or impairment 12/02/2010  . Fibromyalgia 05/04/2007    Sumner Boast., PT 01/27/2017, 9:59 AM  [address] [place], [place], [address] Phone: [phone]   Fax:  [phone]  Name: Ellisa Devivo MRN: [id number] Date of Birth:  07/21/1941

## 2017-01-31 ENCOUNTER — Encounter: Payer: Self-pay | Admitting: Rehabilitation

## 2017-01-31 ENCOUNTER — Ambulatory Visit: Payer: Medicare Other | Admitting: Rehabilitation

## 2017-01-31 DIAGNOSIS — M545 Low back pain, unspecified: Secondary | ICD-10-CM

## 2017-01-31 DIAGNOSIS — M5136 Other intervertebral disc degeneration, lumbar region: Secondary | ICD-10-CM

## 2017-01-31 DIAGNOSIS — M62830 Muscle spasm of back: Secondary | ICD-10-CM

## 2017-01-31 NOTE — Therapy (Addendum)
[address] [place], [place], [address] Phone: [phone]   Fax:  [phone]  Physical Therapy Treatment  Patient Details  Name: [name] MRN: [id number] Date of Birth: [birth year] Referring Provider: Reynold Bowen  Encounter Date: 01/31/2017      PT End of Session - 01/31/17 0905    Visit Number 2   Date for PT Re-Evaluation 03/29/17   PT Start Time 0846   Activity Tolerance Patient tolerated treatment well      Past Medical History:  Diagnosis Date  . Cancer ([place]) 2014   kidney  . Esophageal stricture   . Fibromyalgia   . GERD (gastroesophageal reflux disease)   . Helicobacter pylori gastritis   . Hypertension   . Intention tremor   . Lumbago   . Osteoarthritis of spine   . Pneumonia    hx of years ago   . PUD (peptic ulcer disease)   . PVD (peripheral vascular disease) ([place])     Past Surgical History:  Procedure Laterality Date  . ABDOMINAL HYSTERECTOMY  1985  . BREAST ENHANCEMENT SURGERY  1974  . CATARACT EXTRACTION Bilateral 2014   shapiro  . LAPAROSCOPIC PARTIAL NEPHRECTOMY Left April '13   [facility], Dr. Alinda Money  . LUMBAR FUSION  03/2003   L4-5  . TONSILLECTOMY  1958  . TUBAL LIGATION  1984    There were no vitals filed for this visit.      Subjective Assessment - 01/31/17 0848    Subjective Feeling about the same.  Doing the stretches as told   Pain Score 5    Pain Location Back   Pain Orientation Mid                         [facility] Adult PT Treatment/Exercise - 01/31/17 0001      Exercises   Exercises Lumbar     Lumbar Exercises: Stretches   Single Knee to Chest Stretch 10 seconds   Single Knee to Chest Stretch Limitations 10reps   Double Knee to Chest Stretch 3 reps;20 seconds   Pelvic Tilt 5 reps   Pelvic Tilt Limitations 10seconds with vcs to not hold breath     Lumbar Exercises: Supine   Bridge 10 reps   Other Supine Lumbar Exercises  ball squeeze with TrA 10"x10      Moist Heat Therapy   Number Minutes Moist Heat 15 Minutes   Moist Heat Location Lumbar Spine     Electrical Stimulation   Electrical Stimulation Location lumbar   Electrical Stimulation Action IFC   Electrical Stimulation Parameters supine   Electrical Stimulation Goals Pain     Manual Therapy   Manual Therapy Soft tissue mobilization   Soft tissue mobilization lumbara paraspinals bilateral; manual stretches hamstrings and piriformis bilaterally                  PT Short Term Goals - 01/27/17 0955      PT SHORT TERM GOAL #1   Title independent iwth initial HEP   Time 2   Period Weeks   Status New           PT Long Term Goals - 01/27/17 0955      PT LONG TERM GOAL #1   Title decrease pain 50%   Time 8   Period Weeks   Status New     PT LONG TERM GOAL #2   Title understand  proper posture and body mechanics    Time 8   Period Weeks   Status New     PT LONG TERM GOAL #3   Title increase lumbar ROM 25%   Time 8   Period Weeks   Status New     PT LONG TERM GOAL #4   Title report sleeping 50% better   Time 8   Period Weeks   Status New               Plan - 01/31/17 0906    Clinical Impression Statement Pt tolerated all well but reporting that the all "hurt" just a bit.  Reviewed flexion based HEP and included some manual work to the lumbar region with 1 ttp to the R lumbar parapsinals around L3.     PT Frequency 2x / week   PT Duration 8 weeks   PT Treatment/Interventions Electrical Stimulation;Moist Heat;Therapeutic exercise;Manual techniques;Therapeutic activities;Patient/family education;Ultrasound;Traction;ADLs/Self Care Home Management   PT Next Visit Plan Will progress with gym exercises for core strength.  She had stopped PT in the past due to copay issues   Consulted and Agree with Plan of Care Patient      Patient will benefit from skilled therapeutic intervention in order to improve the  following deficits and impairments:     Visit Diagnosis: Acute bilateral low back pain without sciatica  Muscle spasm of back  DDD (degenerative disc disease), lumbar     Problem List Patient Active Problem List   Diagnosis Date Noted  . Anxiety 11/14/2016  . Swelling 06/18/2016  . Hypothyroidism 05/17/2016  . Abnormal CT scan 12/28/2015  . Chronic low back pain 04/13/2015  . Cervical disc disorder with radiculopathy of cervical region 06/20/2014  . Bursitis of right shoulder 05/04/2014  . Insomnia 04/21/2013  . Routine health maintenance 01/28/2013  . HTN (hypertension), benign 01/09/2012  . Memory loss or impairment 12/02/2010  . Fibromyalgia 05/04/2007   PHYSICAL THERAPY DISCHARGE SUMMARY   Plan: Patient agrees to discharge.  Patient goals were not met. Patient is being discharged due to financial reasons.  ?????      Stark Bray, DPT, CMP 01/31/2017, 9:30 AM  [address] [place], [place], [address] Phone: [phone]   Fax:  [phone]  Name: [name] MRN: [id number] Date of Birth: 06/07/1941

## 2017-02-04 ENCOUNTER — Ambulatory Visit: Payer: Medicare Other | Admitting: Physical Therapy

## 2017-02-06 ENCOUNTER — Ambulatory Visit: Payer: Medicare Other | Admitting: Physical Therapy

## 2017-04-23 ENCOUNTER — Other Ambulatory Visit: Payer: Self-pay | Admitting: Internal Medicine

## 2017-04-23 NOTE — Telephone Encounter (Signed)
Please advise 

## 2017-04-29 ENCOUNTER — Other Ambulatory Visit: Payer: Self-pay

## 2017-04-29 MED ORDER — FUROSEMIDE 20 MG PO TABS: 20.0000 mg | ORAL_TABLET | Freq: Every day | ORAL | 0 refills | Status: DC

## 2017-04-29 MED ORDER — LOSARTAN POTASSIUM 100 MG PO TABS: 100.0000 mg | ORAL_TABLET | Freq: Every day | ORAL | 0 refills | Status: DC

## 2017-04-29 MED ORDER — OMEPRAZOLE 40 MG PO CPDR: 40.0000 mg | DELAYED_RELEASE_CAPSULE | Freq: Every day | ORAL | 0 refills | Status: DC

## 2017-04-29 MED ORDER — METOPROLOL SUCCINATE ER 50 MG PO TB24: 50.0000 mg | ORAL_TABLET | Freq: Every day | ORAL | 0 refills | Status: DC

## 2017-10-23 ENCOUNTER — Other Ambulatory Visit: Payer: Self-pay | Admitting: Internal Medicine

## 2017-12-25 ENCOUNTER — Encounter: Payer: Self-pay | Admitting: Internal Medicine

## 2018-01-16 ENCOUNTER — Other Ambulatory Visit: Payer: Self-pay | Admitting: Internal Medicine

## 2018-01-31 ENCOUNTER — Other Ambulatory Visit: Payer: Self-pay | Admitting: Internal Medicine

## 2018-03-26 ENCOUNTER — Other Ambulatory Visit: Payer: Self-pay | Admitting: Endocrinology

## 2018-03-26 DIAGNOSIS — C649 Malignant neoplasm of unspecified kidney, except renal pelvis: Secondary | ICD-10-CM

## 2018-03-30 IMAGING — DX DG CHEST 2V
2 series · 2 of 2 positions shown · non-contrast
Comparison: 02/08/2015

CLINICAL DATA: Follow-up renal cell carcinoma. Evaluate for Mets.
No chest complaints.

EXAM:
CHEST  2 VIEW

[chest pa]
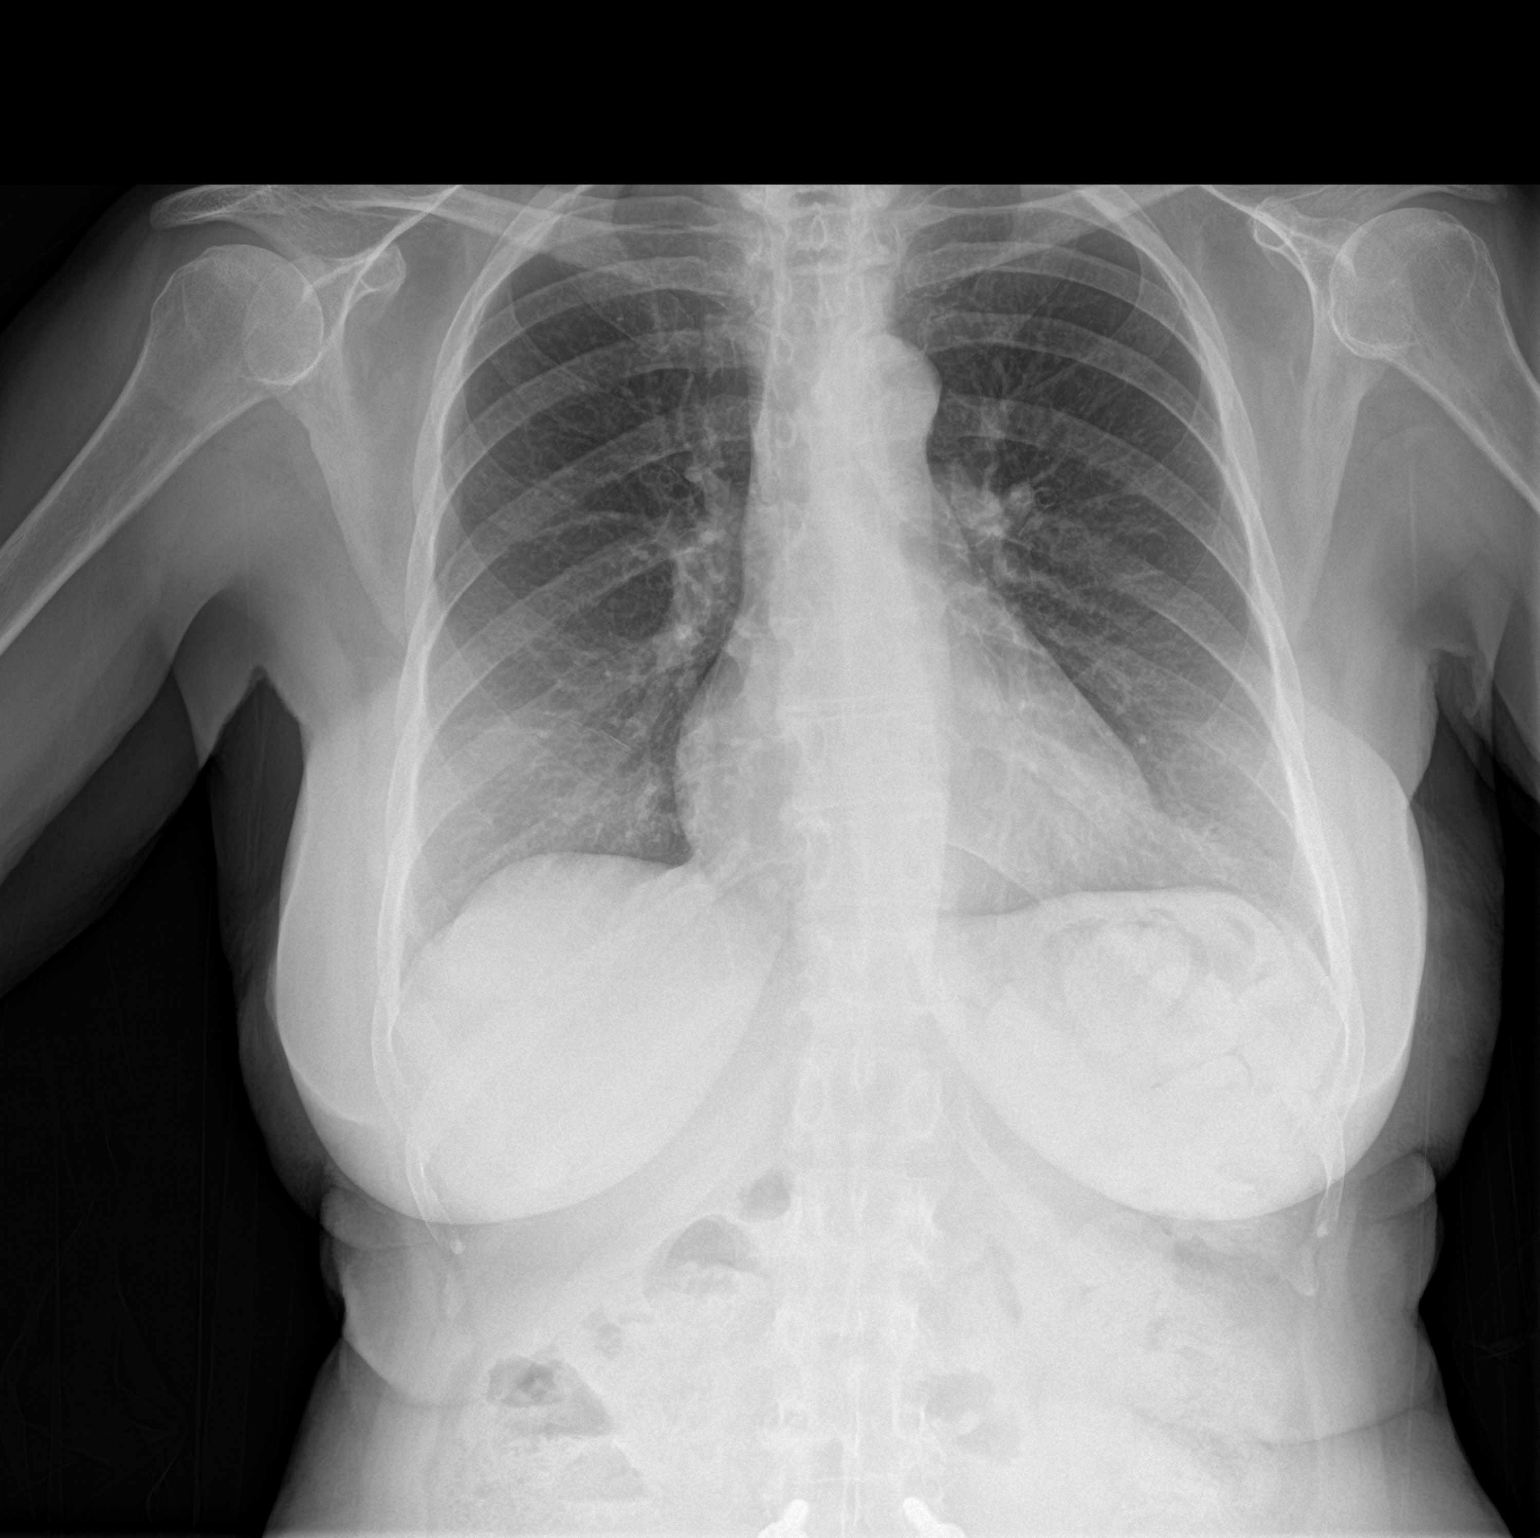

[chest lat]
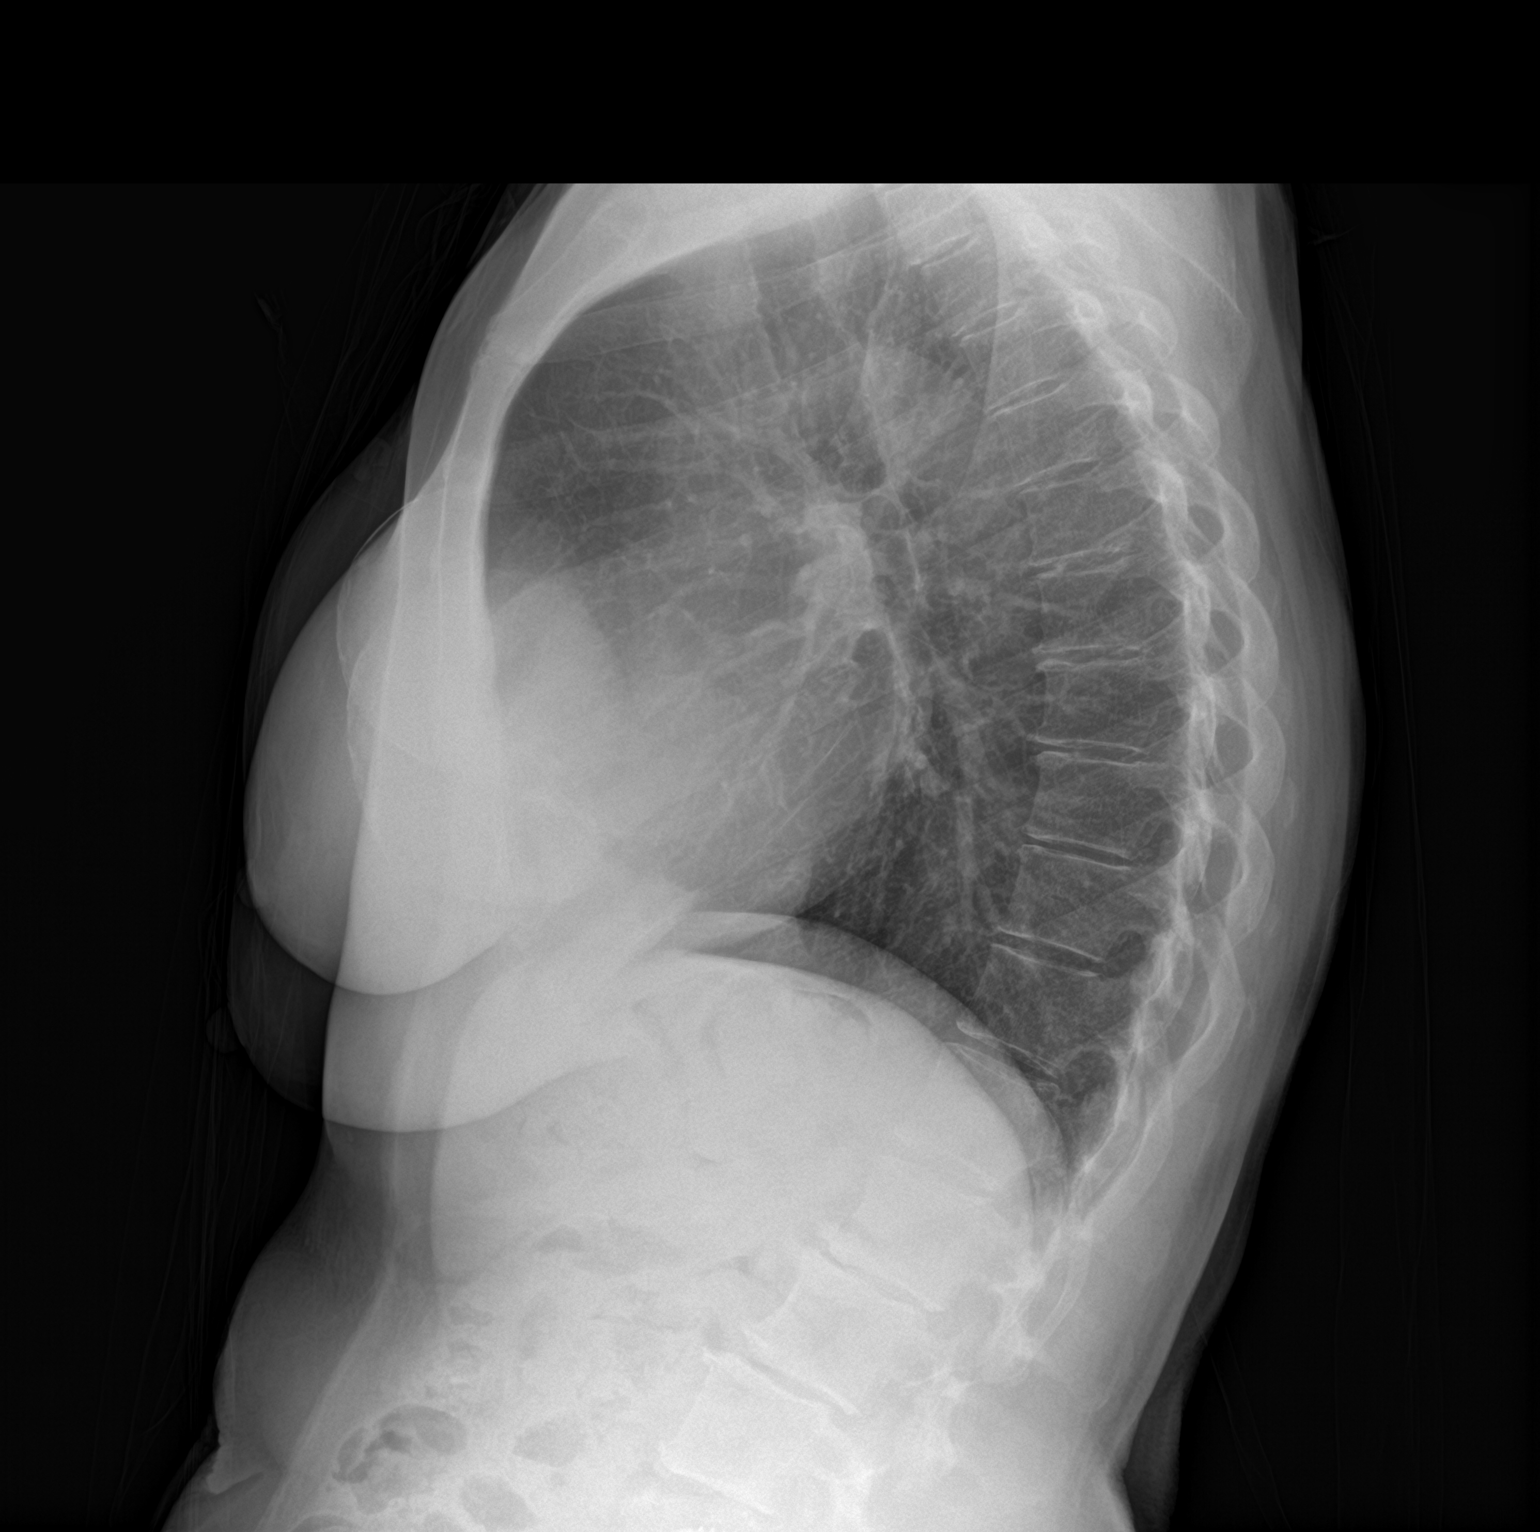

[2 of 2 positions shown; findings below may reference images not displayed]

FINDINGS: Heart and mediastinal contours are within normal limits. No focal
opacities or effusions. No acute bony abnormality. Bilateral breast
implants noted.
IMPRESSION: No active cardiopulmonary disease.

## 2018-04-07 ENCOUNTER — Ambulatory Visit
Admission: RE | Admit: 2018-04-07 | Discharge: 2018-04-07 | Disposition: A | Discharge status: Home / Self Care | Payer: Medicare Other | Source: Ambulatory Visit | Attending: Endocrinology | Admitting: Endocrinology

## 2018-04-07 DIAGNOSIS — C649 Malignant neoplasm of unspecified kidney, except renal pelvis: Secondary | ICD-10-CM

## 2018-04-07 MED ORDER — IOPAMIDOL (ISOVUE-300) INJECTION 61%
100.0000 mL | Freq: Once | INTRAVENOUS | Status: AC | PRN
  Administered 2018-04-07: 100 mL via INTRAVENOUS

## 2018-04-08 ENCOUNTER — Other Ambulatory Visit (HOSPITAL_COMMUNITY): Payer: Self-pay

## 2018-04-09 ENCOUNTER — Ambulatory Visit (HOSPITAL_COMMUNITY)
Admission: RE | Admit: 2018-04-09 | Discharge: 2018-04-09 | Disposition: A | Discharge status: Home / Self Care | Payer: Medicare Other | Source: Ambulatory Visit | Attending: Endocrinology | Admitting: Endocrinology

## 2018-04-09 DIAGNOSIS — M810 Age-related osteoporosis without current pathological fracture: Secondary | ICD-10-CM | POA: Diagnosis not present

## 2018-04-09 MED ORDER — ZOLEDRONIC ACID 5 MG/100ML IV SOLN
INTRAVENOUS | Status: AC
  Filled 2018-04-09: qty 100

## 2018-04-09 MED ORDER — ZOLEDRONIC ACID 5 MG/100ML IV SOLN
5.0000 mg | Freq: Once | INTRAVENOUS | Status: AC
  Administered 2018-04-09: 5 mg via INTRAVENOUS

## 2018-04-09 NOTE — Discharge Instructions (Signed)

## 2018-04-24 ENCOUNTER — Ambulatory Visit: Payer: Medicare Other | Admitting: Internal Medicine

## 2018-04-24 ENCOUNTER — Encounter: Payer: Self-pay | Admitting: Internal Medicine

## 2018-04-24 VITALS — BP 116/76 | HR 60 | Ht 61.0 in | Wt 139.5 lb

## 2018-04-24 DIAGNOSIS — R140 Abdominal distension (gaseous): Secondary | ICD-10-CM

## 2018-04-24 DIAGNOSIS — K5900 Constipation, unspecified: Secondary | ICD-10-CM

## 2018-04-24 DIAGNOSIS — R935 Abnormal findings on diagnostic imaging of other abdominal regions, including retroperitoneum: Secondary | ICD-10-CM | POA: Diagnosis not present

## 2018-04-24 NOTE — Progress Notes (Signed)
HISTORY OF PRESENT ILLNESS:  [name] is a 77 y.o. female with multiple medical problems including history of renal cell carcinoma who sent today by her primary care provider [name] regarding problems with worsening constipation and abnormal CT scan. The patient was last seen in this office 12/28/2015 regarding abnormal CT scan with thickening of the terminal ileum and colon suggesting colitis or ileitis. Thus, she underwent complete colonoscopy 02/08/2016. The colon and ileum were normal except for incidental sigmoid diverticulosis. The patient also has a history of GERD and incidental esophageal stricture on endoscopy. She is maintained on omeprazole daily without symptoms. She tells me that she has had worsening constipation over the past several years. She has been on tramadol chronically for many years. Medication over the past few years is Cymbalta. Her current bowel regimen includes Colace 300 mg daily, and 34 g of MiraLAX in 6 ounces of juice daily. She also requires a Dulcolax tablet approximately once per week. With this regimen she moves her bowels approximately twice per week. The patient tells me that she does become bloated and uncomfortable if she goes several days without a bowel movement. This symptom is relieved with defecation. She tells me that her husband has passed in her eating habits have changed. She did undergo CT scan of the abdomen and pelvis because of her history of kidney cancer and complaints of left side pain. This was performed 04/07/2018. Examination revealed no significant abnormalities but the dominant finding was increased stool retained throughout the colon, especially the redundant left colon. In addition to reviewing the CT scan I have reviewed outside blood work from August 2019. Normal comprehensive metabolic panel. Sedimentation rate of 22. Normal TSH.  REVIEW OF SYSTEMS:  All non-GI ROS negative unless otherwise stated in the history of present illness  except for back pain, arthritis, ankle swelling, depression  Past Medical History:  Diagnosis Date  . Cancer ([name]) 2014   kidney  . Esophageal stricture   . Fibromyalgia   . GERD (gastroesophageal reflux disease)   . Helicobacter pylori gastritis   . Hypertension   . Intention tremor   . Lumbago   . Osteoarthritis of spine   . Pneumonia    hx of years ago   . PUD (peptic ulcer disease)   . PVD (peripheral vascular disease) ([place])     Past Surgical History:  Procedure Laterality Date  . ABDOMINAL HYSTERECTOMY  1985  . BREAST ENHANCEMENT SURGERY  1974  . CATARACT EXTRACTION Bilateral 2014   shapiro  . LAPAROSCOPIC PARTIAL NEPHRECTOMY Left April '13   [facility], Dr. Alinda Money  . LUMBAR FUSION  03/2003   L4-5  . TONSILLECTOMY  1958  . TUBAL LIGATION  1984    Social History [name]  reports that she has never smoked. She has never used smokeless tobacco. She reports that she drinks alcohol. She reports that she does not use drugs.  family history includes COPD in her mother; Crohn's disease in her other; Diabetes in her daughter, father, and son; Hearing loss in her brother; Heart disease in her brother, father, and mother; Hyperlipidemia in her brother; Hypertension in her son; Melanoma in her son; Parkinson's disease in her father; Stroke in her brother and father; Thyroid disease in her mother.  Allergies  Allergen Reactions  . Morphine And Related Itching and Rash    Given in IV.       PHYSICAL EXAMINATION: Vital signs: BP 116/76 (BP Location: Left Arm, Patient Position: Sitting,  Cuff Size: Normal)   Pulse 60   Ht 5\' 1"  (1.549 m) Comment: height measured without shoes  Wt 139 lb 8 oz (63.3 kg)   BMI 26.36 kg/m   Constitutional: generally well-appearing, no acute distress Psychiatric: alert and oriented x3, cooperative Eyes: extraocular movements intact, anicteric, conjunctiva pink Mouth: oral pharynx moist, no lesions Neck: supple no  lymphadenopathy Cardiovascular: heart regular rate and rhythm, no murmur Lungs: clear to auscultation bilaterally Abdomen: soft, nontender, nondistended, no obvious ascites, no peritoneal signs, normal bowel sounds, no organomegaly Rectal:omitted Extremities: no clubbing, cyanosis, or lower extremity edema bilaterally Skin: no lesions on visible extremities Neuro: No focal deficits. Cranial nerves intact. Normal DTRs  ASSESSMENT:  #1. Chronic functional constipation. Exacerbated by medications. #2. Colonoscopy 2017 with diverticulosis #3. Abnormal CT scan of the abdomen with increased stool burden   PLAN:  #1. Trial of Linzess 290 g daily. Samples given #2. Discussed how to increase or titrate MiraLAX to achieve desired result #3. The patient has been told contact the office if either the strategies does not adequately address her issues with constipation and bloating.   25 minutes spent face-to-face with the patient. Greater than 50% a time use for counseling regarding her chronic constipation and abdominal bloating

## 2018-04-24 NOTE — Patient Instructions (Signed)
You have been given some samples of Linzess 290 mcg.  If these work for you, call our office and we will send in a prescription.  You may increase your Miralax as discussed with Dr. Henrene Pastor.

## 2018-05-01 ENCOUNTER — Telehealth: Payer: Self-pay | Admitting: Internal Medicine

## 2018-05-01 ENCOUNTER — Other Ambulatory Visit: Payer: Self-pay

## 2018-05-01 MED ORDER — LINACLOTIDE 290 MCG PO CAPS: 290.0000 ug | ORAL_CAPSULE | Freq: Every day | ORAL | 3 refills | Status: DC

## 2018-05-01 NOTE — Telephone Encounter (Signed)
Prescription sent to pharmacy and pt aware. 

## 2018-07-30 ENCOUNTER — Other Ambulatory Visit: Payer: Self-pay | Admitting: Endocrinology

## 2018-07-30 DIAGNOSIS — E039 Hypothyroidism, unspecified: Secondary | ICD-10-CM

## 2018-08-07 ENCOUNTER — Other Ambulatory Visit: Payer: Medicare Other

## 2018-08-11 ENCOUNTER — Other Ambulatory Visit: Payer: Medicare Other

## 2018-08-17 ENCOUNTER — Ambulatory Visit
Admission: RE | Admit: 2018-08-17 | Discharge: 2018-08-17 | Disposition: A | Discharge status: Home / Self Care | Payer: Medicare Other | Source: Ambulatory Visit | Attending: Endocrinology | Admitting: Endocrinology

## 2018-08-17 DIAGNOSIS — E039 Hypothyroidism, unspecified: Secondary | ICD-10-CM

## 2018-09-30 ENCOUNTER — Emergency Department (HOSPITAL_BASED_OUTPATIENT_CLINIC_OR_DEPARTMENT_OTHER): Payer: Medicare Other

## 2018-09-30 ENCOUNTER — Encounter (HOSPITAL_BASED_OUTPATIENT_CLINIC_OR_DEPARTMENT_OTHER): Payer: Self-pay | Admitting: *Deleted

## 2018-09-30 ENCOUNTER — Emergency Department (HOSPITAL_BASED_OUTPATIENT_CLINIC_OR_DEPARTMENT_OTHER)
Admission: EM | Admit: 2018-09-30 | Discharge: 2018-09-30 | Disposition: A | Discharge status: Home / Self Care | Payer: Medicare Other | Attending: Emergency Medicine | Admitting: Emergency Medicine

## 2018-09-30 ENCOUNTER — Other Ambulatory Visit: Payer: Self-pay

## 2018-09-30 DIAGNOSIS — I10 Essential (primary) hypertension: Secondary | ICD-10-CM | POA: Diagnosis not present

## 2018-09-30 DIAGNOSIS — M79602 Pain in left arm: Secondary | ICD-10-CM | POA: Insufficient documentation

## 2018-09-30 DIAGNOSIS — R519 Headache, unspecified: Secondary | ICD-10-CM

## 2018-09-30 DIAGNOSIS — Z85528 Personal history of other malignant neoplasm of kidney: Secondary | ICD-10-CM | POA: Diagnosis not present

## 2018-09-30 DIAGNOSIS — E039 Hypothyroidism, unspecified: Secondary | ICD-10-CM | POA: Diagnosis not present

## 2018-09-30 DIAGNOSIS — R202 Paresthesia of skin: Secondary | ICD-10-CM | POA: Diagnosis not present

## 2018-09-30 DIAGNOSIS — Z79899 Other long term (current) drug therapy: Secondary | ICD-10-CM | POA: Diagnosis not present

## 2018-09-30 DIAGNOSIS — R51 Headache: Secondary | ICD-10-CM | POA: Insufficient documentation

## 2018-09-30 DIAGNOSIS — M542 Cervicalgia: Secondary | ICD-10-CM | POA: Insufficient documentation

## 2018-09-30 MED ORDER — PROMETHAZINE HCL 25 MG/ML IJ SOLN
6.2500 mg | Freq: Once | INTRAMUSCULAR | Status: AC
  Administered 2018-09-30: 6.25 mg via INTRAMUSCULAR

## 2018-09-30 MED ORDER — PROMETHAZINE HCL 25 MG/ML IJ SOLN
INTRAMUSCULAR | Status: AC
  Filled 2018-09-30: qty 1

## 2018-09-30 MED ORDER — KETOROLAC TROMETHAMINE 30 MG/ML IJ SOLN
30.0000 mg | Freq: Once | INTRAMUSCULAR | Status: AC
  Administered 2018-09-30: 30 mg via INTRAMUSCULAR
  Filled 2018-09-30: qty 1

## 2018-09-30 NOTE — ED Triage Notes (Signed)
Pt reports headache x Tuesday night, states is constant and stays at 9/10. Pt is in nad, amb with quick steady gait, states her face is also sore to touch.

## 2018-09-30 NOTE — ED Provider Notes (Signed)
[facility] EMERGENCY DEPARTMENT Provider Note   CSN: [id number] Arrival date & time: 09/30/18  0754    History   Chief Complaint Chief Complaint  Patient presents with  . Headache    HPI [name] is a 78 y.o. female.     HPI Patient with headache.  Began a day and a half ago.  Dull.  Is been steady since.  States it is on the top of her head.  States also now has pain down her neck into her left arm.  States she also has tingling in her face.  She will rarely get headaches.  When she does get headaches that usually goes away with little bit of sleep.  This 1 has not gone away.  States the face feels like the tingling is he get when you sleep on an arm.  No numbness weakness.  No trauma.  No fevers.  Does have previous history of renal cancer but states she is cancer free as far she knows.  No sore throat.  No fevers. Past Medical History:  Diagnosis Date  . Cancer ([place]) 2014   kidney  . Esophageal stricture   . Fibromyalgia   . GERD (gastroesophageal reflux disease)   . Helicobacter pylori gastritis   . Hypertension   . Intention tremor   . Lumbago   . Osteoarthritis of spine   . Pneumonia    hx of years ago   . PUD (peptic ulcer disease)   . PVD (peripheral vascular disease) [facility])     Patient Active Problem List   Diagnosis Date Noted  . Anxiety 11/14/2016  . Swelling 06/18/2016  . Hypothyroidism 05/17/2016  . Abnormal CT scan 12/28/2015  . Chronic low back pain 04/13/2015  . Cervical disc disorder with radiculopathy of cervical region 06/20/2014  . Bursitis of right shoulder 05/04/2014  . Insomnia 04/21/2013  . Routine health maintenance 01/28/2013  . HTN (hypertension), benign 01/09/2012  . Memory loss or impairment 12/02/2010  . Fibromyalgia 05/04/2007    Past Surgical History:  Procedure Laterality Date  . ABDOMINAL HYSTERECTOMY  1985  . BREAST ENHANCEMENT SURGERY  1974  . CATARACT EXTRACTION Bilateral 2014   shapiro  . LAPAROSCOPIC  PARTIAL NEPHRECTOMY Left April '13   [facility], Dr. Alinda Money  . LUMBAR FUSION  03/2003   L4-5  . TONSILLECTOMY  1958  . TUBAL LIGATION  1984     OB History   No obstetric history on file.      Home Medications    Prior to Admission medications   Medication Sig Start Date End Date Taking? Authorizing Provider  ALPRAZolam Duanne Moron) 0.5 MG tablet Take 1 tablet (0.5 mg total) by mouth 2 (two) times daily as needed. for anxiety 11/14/16   Plotnikov, Evie Lacks, MD  amitriptyline (ELAVIL) 25 MG tablet TAKE 1 TABLET BY MOUTH EVERY DAY AT BEDTIME 10/23/17   Hoyt Koch, MD  amLODipine (NORVASC) 5 MG tablet Take 1 tablet (5 mg total) by mouth daily. 11/07/16   Lauree Chandler, NP  calcium-vitamin D (OSCAL WITH D) 500-200 MG-UNIT per tablet Take 1 tablet by mouth daily.    [provider]  cholecalciferol (VITAMIN D) 1000 UNITS tablet Take 1,000 Units by mouth daily.    [provider]  DULoxetine (CYMBALTA) 60 MG capsule Take 1 capsule by mouth daily. 04/14/18   [provider]  furosemide (LASIX) 20 MG tablet Take 1 tablet (20 mg total) by mouth daily. NEED ANNUAL VISIT FOR  FURTHER REFILLS 04/29/17   Hoyt Koch, MD  levothyroxine (SYNTHROID, LEVOTHROID) 75 MCG tablet Take 1 tablet by mouth daily. 02/06/18   [provider]  linaclotide Rolan Lipa) 290 MCG CAPS capsule Take 1 capsule (290 mcg total) by mouth daily before breakfast. 05/01/18   Irene Shipper, MD  losartan (COZAAR) 100 MG tablet Take 1 tablet (100 mg total) by mouth daily. NEED ANNUAL VISIT FOR FURTHER REFILLS 04/29/17 04/29/18  Hoyt Koch, MD  meloxicam (MOBIC) 7.5 MG tablet Take 1 tablet by mouth daily as needed. 03/25/18   [provider]  metoprolol succinate (TOPROL-XL) 50 MG 24 hr tablet Take 1 tablet (50 mg total) by mouth daily. Take with or immediately following a meal. NEED ANNUAL VISIT FOR FURTHER REFILLS 04/29/17   Hoyt Koch, MD  Multiple Vitamins-Minerals  (MULTIVITAMIN WITH MINERALS) tablet Take 1 tablet by mouth daily.    [provider]  omeprazole (PRILOSEC) 40 MG capsule Take 1 capsule (40 mg total) by mouth daily. NEED ANNUAL VISIT FOR FURTHER REFILLS 04/29/17   Hoyt Koch, MD  OVER THE COUNTER MEDICATION Tumeric 1 capsule daily.    [provider]  polyethylene glycol powder (GLYCOLAX/MIRALAX) powder DISSOLVE 17 GRAMS INTO LIQUID AND DRINK BY MOUTH EVERY DAY 09/09/16   Hoyt Koch, MD  traMADol (ULTRAM) 50 MG tablet TAKE 1 TABLET BY MOUTH EVERY 8 HOURS AS NEEDED FOR MODERATE TO SEVERE PAIN. MAY TAKE 1 ADDITIONAL TABLET DAILY AS NEEDED FOR SEVERE PAIN 10/24/16   Hoyt Koch, MD  zoledronic acid (RECLAST) 5 MG/100ML SOLN injection Inject 5 mg into the vein. Yearly    [provider]    Family History Family History  Problem Relation Age of Onset  . Heart disease Mother   . COPD Mother   . Thyroid disease Mother   . Diabetes Father   . Heart disease Father        CABG  . Stroke Father   . Parkinson's disease Father   . Heart disease Brother   . Stroke Brother   . Hyperlipidemia Brother   . Diabetes Daughter   . Hearing loss Brother   . Crohn's disease Other        neice  . Hypertension Son   . Diabetes Son   . Melanoma Son   . Colon cancer Neg Hx   . Stomach cancer Neg Hx   . Esophageal cancer Neg Hx     Social History Social History   Tobacco Use  . Smoking status: Never Smoker  . Smokeless tobacco: Never Used  Substance Use Topics  . Alcohol use: Yes    Alcohol/week: 0.0 standard drinks    Comment: occasional glass of wine with dinner   . Drug use: No     Allergies   Morphine and related   Review of Systems Review of Systems  Constitutional: Negative for appetite change.  HENT: Negative for congestion.   Eyes: Negative for photophobia.  Respiratory: Negative for shortness of breath.   Cardiovascular: Negative for chest pain.  Gastrointestinal:  Negative for abdominal pain.  Genitourinary: Negative for flank pain.  Musculoskeletal: Positive for neck pain.  Skin: Negative for rash.  Neurological: Positive for numbness and headaches.  Hematological: Negative for adenopathy.  Psychiatric/Behavioral: Negative for confusion.     Physical Exam Updated Vital Signs BP (!) 141/72 (BP Location: Right Arm)   Pulse 63   Temp 98.5 F (36.9 C) (Oral)   Resp 18  Ht 5\' 2"  (1.575 m)   Wt 61.7 kg   SpO2 95%   BMI 24.87 kg/m   Physical Exam HENT:     Head: Atraumatic.  Eyes:     Pupils: Pupils are equal, round, and reactive to light.  Neck:     Musculoskeletal: Normal range of motion and neck supple. No neck rigidity.  Cardiovascular:     Rate and Rhythm: Normal rate and regular rhythm.  Pulmonary:     Effort: Pulmonary effort is normal.  Abdominal:     General: Bowel sounds are normal.  Musculoskeletal: Normal range of motion.  Skin:    General: Skin is warm.  Neurological:     Mental Status: She is alert.     Comments: Paresthesias to bilateral mid face.  Face symmetric.  Eye movements intact.  Good grip strength bilaterally.  Sensation intact over arms bilaterally.  Psychiatric:        Mood and Affect: Mood normal.      ED Treatments / Results  Labs (all labs ordered are listed, but only abnormal results are displayed) Labs Reviewed - No data to display  EKG None  Radiology Ct Head Wo Contrast  Result Date: 09/30/2018 CLINICAL DATA:  Headache.  History of renal cell carcinoma EXAM: CT HEAD WITHOUT CONTRAST TECHNIQUE: Contiguous axial images were obtained from the base of the skull through the vertex without intravenous contrast. COMPARISON:  Jan 09, 2012 FINDINGS: Brain: The ventricles are normal in size and configuration. There is frontal atrophy bilaterally. There is no intracranial mass, hemorrhage, extra-axial fluid collection, or midline shift. There is mild small vessel disease in the centra semiovale,  primarily on the right. Elsewhere brain parenchyma appears unremarkable. No acute infarct is demonstrable. Vascular: There is no hyperdense vessel. There is mild calcification in each carotid siphon. Skull: The bony calvarium appears intact. There is a small benign exostosis along each posterior frontal bone. Sinuses/Orbits: There is mild mucosal thickening in several ethmoid air cells. Other visualized paranasal sinuses are clear. Visualized orbits appear symmetric bilaterally. Other: Visualized mastoid air cells are clear. IMPRESSION: Frontal atrophy bilaterally. Slight periventricular small vessel disease. No acute infarct. No mass or hemorrhage. There are foci of arterial vascular calcification. There is mild mucosal thickening in several ethmoid air cells. Electronically Signed   By: Lowella Grip III M.D.   On: 09/30/2018 08:47    Procedures Procedures (including critical care time)  Medications Ordered in ED Medications  ketorolac (TORADOL) 30 MG/ML injection 30 mg (30 mg Intramuscular Given 09/30/18 0943)  promethazine (PHENERGAN) injection 6.25 mg (6.25 mg Intramuscular Given 09/30/18 0942)     Initial Impression / Assessment and Plan / ED Course  I have reviewed the triage vital signs and the nursing notes.  Pertinent labs & imaging results that were available during my care of the patient were reviewed by me and considered in my medical decision making (see chart for details).        Patient with headache.  Head CT reassuring.  Does have some paresthesias on face.  States feeling better after treatment.  Will discharge home to follow-up with PCP as needed.  Final Clinical Impressions(s) / ED Diagnoses   Final diagnoses:  Acute nonintractable headache, unspecified headache type    ED Discharge Orders    None       Davonna Belling, MD 09/30/18 1042

## 2018-12-23 ENCOUNTER — Encounter: Payer: Self-pay | Admitting: General Surgery

## 2018-12-24 ENCOUNTER — Encounter: Payer: Self-pay | Admitting: Internal Medicine

## 2018-12-24 ENCOUNTER — Ambulatory Visit (INDEPENDENT_AMBULATORY_CARE_PROVIDER_SITE_OTHER): Payer: Medicare Other | Admitting: Internal Medicine

## 2018-12-24 ENCOUNTER — Other Ambulatory Visit: Payer: Self-pay

## 2018-12-24 VITALS — Ht 62.0 in | Wt 132.0 lb

## 2018-12-24 DIAGNOSIS — R159 Full incontinence of feces: Secondary | ICD-10-CM | POA: Diagnosis not present

## 2018-12-24 DIAGNOSIS — R152 Fecal urgency: Secondary | ICD-10-CM | POA: Diagnosis not present

## 2018-12-24 DIAGNOSIS — R197 Diarrhea, unspecified: Secondary | ICD-10-CM

## 2018-12-24 MED ORDER — METRONIDAZOLE 250 MG PO TABS: 250.0000 mg | ORAL_TABLET | Freq: Three times a day (TID) | ORAL | 0 refills | Status: DC

## 2018-12-24 NOTE — Addendum Note (Signed)
Addended by: Candie Mile on: 12/24/2018 04:36 PM   Modules accepted: Orders

## 2018-12-24 NOTE — Progress Notes (Signed)
HISTORY OF PRESENT ILLNESS:  [name] is a 78 y.o. female who schedules this telehealth medicine visit in the midst of the coronavirus pandemic regarding new problems with diarrhea and fecal incontinence.  The patient was last seen in this office April 24, 2018 regarding chronic functional constipation exacerbated by medical therapy.  She is known to have diverticulosis on colonoscopy in 2017.  She was prescribed Linzess and MiraLAX.  See that dictation for details.  Patient tells me that MiraLAX was helpful for a while but was subsequently discontinued secondary to diarrhea.  She continued thereafter on stool softeners and MiraLAX with good results.  She was doing well until approximately 6 weeks ago when she began to develop problems with diarrhea.  She discontinued MiraLAX and stool softeners.  However, diarrhea persisted.  She describes at least 2-3 loose stools per day which are exacerbated by meals.  She has nocturnal loose bowels approximately 3 times per week and has had episodes of incontinence.  For the most part, bowel movements are described as watery.  She denies fevers or abdominal pain though she does report borborygmi.  She has been using liquid Imodium 3 times daily without significant effect.  She denies any antibiotics within the past 6 months.  She has been on PPI long-term.  Bobi over the past 6 months.  Weight has been stable.  She did consult with her primary care provider who recommended probiotic therapy.  This did not help.  Review of outside x-ray and laboratory files reveal no relevant abnormalities to report.  REVIEW OF SYSTEMS:  All non-GI ROS negative unless otherwise stated in the HPI except for arthritis  Past Medical History:  Diagnosis Date  . Cancer ([place]) 2014   kidney  . Esophageal stricture   . Fibromyalgia   . GERD (gastroesophageal reflux disease)   . Helicobacter pylori gastritis   . Hypertension   . Intention tremor   . Lumbago   .  Osteoarthritis of spine   . Pneumonia    hx of years ago   . PUD (peptic ulcer disease)   . PVD (peripheral vascular disease) ([place])     Past Surgical History:  Procedure Laterality Date  . ABDOMINAL HYSTERECTOMY  1985  . BREAST ENHANCEMENT SURGERY  1974  . CATARACT EXTRACTION Bilateral 2014   shapiro  . LAPAROSCOPIC PARTIAL NEPHRECTOMY Left April '13   [facility], Dr. Alinda Money  . LUMBAR FUSION  03/2003   L4-5  . TONSILLECTOMY  1958  . TUBAL LIGATION  1984    Social History [name]  reports that she has never smoked. She has never used smokeless tobacco. She reports current alcohol use. She reports that she does not use drugs.  family history includes COPD in her mother; Crohn's disease in an other family member; Diabetes in her daughter, father, and son; Hearing loss in her brother; Heart disease in her brother, father, and mother; Hyperlipidemia in her brother; Hypertension in her son; Melanoma in her son; Parkinson's disease in her father; Stroke in her brother and father; Thyroid disease in her mother.  Allergies  Allergen Reactions  . Morphine And Related Itching and Rash    Given in IV.       PHYSICAL EXAMINATION: No physical examination with telehealth visit  ASSESSMENT:  1.  6-week history of problems with diarrhea.  Noninflammatory by history.  Possible etiologies include self-limited acute infectious causes (though she is coming to the end of the acute time limit), postinfectious irritable bowel  syndrome, ongoing infectious diarrhea with entity such as Giardia or C. difficile, or microscopic colitis with potential risk factors being PPI and NSAID use. 2.  Colonoscopy 2017 with diverticulosis 3.  General medical problems including a history of renal cell carcinoma.  Stable   PLAN:  1.  Advised to increase Imodium to 4 times daily running.  Hopefully constipation 2.  PRESCRIBE METRONIDAZOLE 250 MG 3 times daily; #30; no refills.  Advised not to use alcohol with  this drug.  She understands 3.  Telehealth office follow-up in 4 weeks.  If the patient is having ongoing symptoms at that time despite tincture of time and empiric antibiotic therapy, then I would recommend colonoscopy with biopsies to rule out microscopic colitis.  I shared this plan with her.  She understands This telehealth visit was initiated by and consented for by the patient.  She was in her home and I was in my office during the encounter.  She understand that there may be associated professional charge for this service which took approximately 25 minutes total

## 2018-12-24 NOTE — Patient Instructions (Addendum)
If you are age 78 or older, your body mass index should be between 23-30. Your Body mass index is 24.14 kg/m. If this is out of the aforementioned range listed, please consider follow up with your Primary Care Provider.  If you are age 18 or younger, your body mass index should be between 19-25. Your Body mass index is 24.14 kg/m. If this is out of the aformentioned range listed, please consider follow up with your Primary Care Provider.   We have sent the following medications to your pharmacy for you to pick up at your convenience: Metronidazole 250 mg - no alcohol use with this drug.  Increase Imodium to 4 times daily running.     Follow up televisit on 01/22/19 at 8:30 am.  Thank you for choosing me and [facility].    Scarlette Shorts, MD

## 2018-12-30 ENCOUNTER — Telehealth: Payer: Self-pay | Admitting: Internal Medicine

## 2018-12-30 NOTE — Telephone Encounter (Signed)
I cannot imagine that you would have unilateral foot swelling and rising blood pressure from metronidazole.  Having said that, she can hold the medication for a few days.  If the problems do not resolve, then she should touch base with Dr. Forde Dandy.  If the problem is resolved, then she could resume her course of metronidazole and see if problems return in which case we might deduce that it is a medication reaction, albeit unusual.  Keep me posted.  Thanks

## 2018-12-30 NOTE — Telephone Encounter (Signed)
On metronidazole for diarrhea the past 5 days- See 5/14 Office note- her BP has been increasing over the past week up to 142/78 this morning.   Her feet are also swelling.  Spoke with Dr Forde Dandy and he states it may be a reaction to the metronidazole.  She did not take the abx today.  Please advise.

## 2018-12-30 NOTE — Telephone Encounter (Signed)
The pt has been given the recommendations from Dr Henrene Pastor and she will follow as requested.  She will call back if stopping abx helps.  Also, she will call Dr Forde Dandy if symptoms do not resolve after stopping abx.

## 2018-12-30 NOTE — Telephone Encounter (Signed)
Pt states that metronidazole is causing some side effects, she said that her left foot is swollen and her BP keeps rising. Pls call her.

## 2019-01-06 ENCOUNTER — Telehealth: Payer: Self-pay | Admitting: Internal Medicine

## 2019-01-06 NOTE — Telephone Encounter (Signed)
FYI Pt informed that she has finished her antibiotics course for diarrhea.  She stated that she is now constipated and will resume Miralax and stool softener in the morning.

## 2019-01-07 NOTE — Telephone Encounter (Signed)
noted 

## 2019-01-20 ENCOUNTER — Telehealth: Payer: Self-pay

## 2019-01-20 NOTE — Telephone Encounter (Signed)
Screening for upcoming phone visit complete

## 2019-01-22 ENCOUNTER — Ambulatory Visit (INDEPENDENT_AMBULATORY_CARE_PROVIDER_SITE_OTHER): Payer: Medicare Other | Admitting: Internal Medicine

## 2019-01-22 ENCOUNTER — Encounter: Payer: Self-pay | Admitting: Internal Medicine

## 2019-01-22 ENCOUNTER — Other Ambulatory Visit: Payer: Self-pay

## 2019-01-22 VITALS — Ht 62.0 in | Wt 130.0 lb

## 2019-01-22 DIAGNOSIS — R197 Diarrhea, unspecified: Secondary | ICD-10-CM | POA: Diagnosis not present

## 2019-01-22 DIAGNOSIS — K5900 Constipation, unspecified: Secondary | ICD-10-CM | POA: Diagnosis not present

## 2019-01-22 NOTE — Progress Notes (Signed)
HISTORY OF PRESENT ILLNESS:  [name] is a 78 y.o. female with chronic functional constipation who was evaluated Dec 24, 2018 regarding a 6-week history of diarrhea.  See that dictation for details.  She was advised to increase her antidiarrheal.  We prescribed metronidazole 250 mg 3 times daily for 10 days which she completed.  She follows up at this time as requested.  Patient is pleased to report that her diarrhea had resolved.  She currently describes a bowel movement once every 3 days.  Currently starting to feel a bit constipated for which she has resumed stool softeners.  No other issues.  She is pleased with her improvement.  Last colonoscopy was 2017 as previously described.  REVIEW OF SYSTEMS:  All non-GI ROS negative unless otherwise stated in the HPI  Past Medical History:  Diagnosis Date  . Cancer ([place]) 2014   kidney  . Esophageal stricture   . Fibromyalgia   . GERD (gastroesophageal reflux disease)   . Helicobacter pylori gastritis   . Hypertension   . Intention tremor   . Lumbago   . Osteoarthritis of spine   . Pneumonia    hx of years ago   . PUD (peptic ulcer disease)   . PVD (peripheral vascular disease) ([place])     Past Surgical History:  Procedure Laterality Date  . ABDOMINAL HYSTERECTOMY  1985  . BREAST ENHANCEMENT SURGERY  1974  . CATARACT EXTRACTION Bilateral 2014   shapiro  . LAPAROSCOPIC PARTIAL NEPHRECTOMY Left April '13   [facility], Dr. Alinda Money  . LUMBAR FUSION  03/2003   L4-5  . TONSILLECTOMY  1958  . TUBAL LIGATION  1984    Social History [name]  reports that she has never smoked. She has never used smokeless tobacco. She reports current alcohol use. She reports that she does not use drugs.  family history includes COPD in her mother; Crohn's disease in an other family member; Diabetes in her daughter, father, and son; Hearing loss in her brother; Heart disease in her brother, father, and mother; Hyperlipidemia in her brother; Hypertension  in her son; Melanoma in her son; Parkinson's disease in her father; Stroke in her brother and father; Thyroid disease in her mother.  Allergies  Allergen Reactions  . Morphine And Related Itching and Rash    Given in IV.       PHYSICAL EXAMINATION: No physical examination with telehealth visit  ASSESSMENT:  1.  Recent problems with diarrhea likely secondary to infectious process which responded to metronidazole or self-limited infectious process which resolved quite steadily.  In the event, she is better simply back to her baseline. 2.  Constipation.  Chronic stable problem 3.  Colonoscopy 2017 4.  GERD.  Previous EGD with incidental esophageal stricture 2014.  Asymptomatic on PPI  PLAN:  1.  Okay to resume office, fiber, and or MiraLAX as previous to attain desired effect regarding her bowel movements.  I discussed with her titration techniques. 2.  Reflux precautions.  Continue PPI control GERD symptoms 3.  Resume care with PCP.  GI follow-up as needed This telehealth medicine visit (telephone only in Medicare patient) was initiated by and consented for by the patient.  She was in her home and I was in my office during the encounter.  She understands her may be an associated professional charge for this service which took 15 minutes in time

## 2019-01-22 NOTE — Patient Instructions (Signed)
1.  Okay to resume water, fiber, and or MiraLAX as previous to attain desired effect regarding her bowel movements.  I discussed with her titration techniques.  2.  Reflux precautions.  Continue PPI control GERD symptoms  3.  Resume care with PCP.  GI follow-up as needed

## 2019-04-14 ENCOUNTER — Inpatient Hospital Stay (HOSPITAL_COMMUNITY)
Admission: RE | Admit: 2019-04-14 | Discharge: 2019-04-14 | Disposition: A | Discharge status: Home / Self Care | Payer: Medicare Other | Source: Ambulatory Visit | Attending: Endocrinology | Admitting: Endocrinology

## 2019-04-20 ENCOUNTER — Other Ambulatory Visit (HOSPITAL_COMMUNITY): Payer: Self-pay

## 2019-04-21 ENCOUNTER — Other Ambulatory Visit: Payer: Self-pay

## 2019-04-21 ENCOUNTER — Encounter (HOSPITAL_COMMUNITY)
Admission: RE | Admit: 2019-04-21 | Discharge: 2019-04-21 | Disposition: A | Discharge status: Home / Self Care | Payer: Medicare Other | Source: Ambulatory Visit | Attending: Endocrinology | Admitting: Endocrinology

## 2019-04-21 DIAGNOSIS — M810 Age-related osteoporosis without current pathological fracture: Secondary | ICD-10-CM | POA: Diagnosis not present

## 2019-04-21 MED ORDER — ZOLEDRONIC ACID 5 MG/100ML IV SOLN: 5.0000 mg | Freq: Once | INTRAVENOUS | Status: DC

## 2019-04-21 MED ORDER — ZOLEDRONIC ACID 5 MG/100ML IV SOLN
INTRAVENOUS | Status: AC
  Administered 2019-04-21: 5 mg
  Filled 2019-04-21: qty 100

## 2019-08-31 ENCOUNTER — Ambulatory Visit: Payer: Medicare Other | Attending: Internal Medicine

## 2019-08-31 DIAGNOSIS — Z23 Encounter for immunization: Secondary | ICD-10-CM | POA: Insufficient documentation

## 2019-08-31 NOTE — Progress Notes (Signed)
   Covid-19 Vaccination Clinic  Name:  [name]    MRN: [id number] DOB: [birth year]  08/31/2019  [name] was observed post Covid-19 immunization for 15 minutes without incidence. She was provided with Vaccine Information Sheet and instruction to access the V-Safe system.   [name] was instructed to call 911 with any severe reactions post vaccine: Marland Kitchen Difficulty breathing  . Swelling of your face and throat  . A fast heartbeat  . A bad rash all over your body  . Dizziness and weakness    Immunizations Administered    Name Date Dose VIS Date Route   Pfizer COVID-19 Vaccine 08/31/2019  3:45 PM 0.3 mL 07/23/2019 Intramuscular   Manufacturer: [place]   Lot: [id number]   [place]: [id number]

## 2019-09-22 ENCOUNTER — Ambulatory Visit: Payer: Medicare Other | Attending: Internal Medicine

## 2019-09-22 DIAGNOSIS — Z23 Encounter for immunization: Secondary | ICD-10-CM | POA: Insufficient documentation

## 2019-09-22 NOTE — Progress Notes (Signed)
   Covid-19 Vaccination Clinic  Name:  [name]    MRN: [id number] DOB: [birth year]  09/22/2019  [name] was observed post Covid-19 immunization for 15 minutes without incidence. She was provided with Vaccine Information Sheet and instruction to access the V-Safe system.   [name] was instructed to call 911 with any severe reactions post vaccine: Marland Kitchen Difficulty breathing  . Swelling of your face and throat  . A fast heartbeat  . A bad rash all over your body  . Dizziness and weakness    Immunizations Administered    Name Date Dose VIS Date Route   Pfizer COVID-19 Vaccine 09/22/2019 10:40 AM 0.3 mL 07/23/2019 Intramuscular   Manufacturer: [employer]   Lot: [id number]   [place]: [id number]

## 2019-12-18 ENCOUNTER — Emergency Department (HOSPITAL_BASED_OUTPATIENT_CLINIC_OR_DEPARTMENT_OTHER)
Admission: EM | Admit: 2019-12-18 | Discharge: 2019-12-18 | Disposition: A | Discharge status: Home / Self Care | Payer: Medicare Other | Attending: Emergency Medicine | Admitting: Emergency Medicine

## 2019-12-18 ENCOUNTER — Encounter (HOSPITAL_BASED_OUTPATIENT_CLINIC_OR_DEPARTMENT_OTHER): Payer: Self-pay | Admitting: Emergency Medicine

## 2019-12-18 ENCOUNTER — Other Ambulatory Visit: Payer: Self-pay

## 2019-12-18 DIAGNOSIS — Z79899 Other long term (current) drug therapy: Secondary | ICD-10-CM | POA: Diagnosis not present

## 2019-12-18 DIAGNOSIS — I10 Essential (primary) hypertension: Secondary | ICD-10-CM | POA: Diagnosis not present

## 2019-12-18 DIAGNOSIS — L03211 Cellulitis of face: Secondary | ICD-10-CM | POA: Diagnosis not present

## 2019-12-18 DIAGNOSIS — Z4800 Encounter for change or removal of nonsurgical wound dressing: Secondary | ICD-10-CM | POA: Diagnosis present

## 2019-12-18 DIAGNOSIS — Z5189 Encounter for other specified aftercare: Secondary | ICD-10-CM

## 2019-12-18 DIAGNOSIS — E039 Hypothyroidism, unspecified: Secondary | ICD-10-CM | POA: Diagnosis not present

## 2019-12-18 MED ORDER — DOXYCYCLINE HYCLATE 100 MG PO CAPS: 100.0000 mg | ORAL_CAPSULE | Freq: Two times a day (BID) | ORAL | 0 refills | Status: AC

## 2019-12-18 MED ORDER — DOXYCYCLINE HYCLATE 100 MG PO CAPS: 100.0000 mg | ORAL_CAPSULE | Freq: Two times a day (BID) | ORAL | 0 refills | Status: DC

## 2019-12-18 NOTE — ED Provider Notes (Signed)
[facility] EMERGENCY DEPARTMENT Provider Note   CSN: [id number] Arrival date & time: 12/18/19  [id number]     History Chief Complaint  Patient presents with  . Wound Check    [name] is a 79 y.o. female.  HPI  Patient is a 79 year old female with past medical history significant for fibromyalgia, kidney cancer, peptic ulcer disease, PVD, hypertension.  Patient states that around approximately 18 April she had a punch biopsy done of the bridge of her nose to evaluate what her dermatologist was concerned was a cancerous lesion.  Was found to be "precancerous "which he states that over the past few days she has noticed redness around the area as well as some warmth and a little bit of pus.  Patient states she has been using a topical medication that was prescribed to her by her dermatologist for "precancerous lesions "that can cause inflammation of the skin.  And she originally suspected that the area of redness was a result of this medication however once she noticed some of the purulent drainage she was concerned for infection.  Patient denies any fevers, chills, nausea, vomiting, fatigue, malaise.      Past Medical History:  Diagnosis Date  . Cancer ([place]) 2014   kidney  . Esophageal stricture   . Fibromyalgia   . GERD (gastroesophageal reflux disease)   . Helicobacter pylori gastritis   . Hypertension   . Intention tremor   . Lumbago   . Osteoarthritis of spine   . Pneumonia    hx of years ago   . PUD (peptic ulcer disease)   . PVD (peripheral vascular disease) [facility])     Patient Active Problem List   Diagnosis Date Noted  . Anxiety 11/14/2016  . Swelling 06/18/2016  . Hypothyroidism 05/17/2016  . Abnormal CT scan 12/28/2015  . Chronic low back pain 04/13/2015  . Cervical disc disorder with radiculopathy of cervical region 06/20/2014  . Bursitis of right shoulder 05/04/2014  . Insomnia 04/21/2013  . Routine health maintenance 01/28/2013  . HTN  (hypertension), benign 01/09/2012  . Memory loss or impairment 12/02/2010  . Fibromyalgia 05/04/2007    Past Surgical History:  Procedure Laterality Date  . ABDOMINAL HYSTERECTOMY  1985  . BREAST ENHANCEMENT SURGERY  1974  . CATARACT EXTRACTION Bilateral 2014   shapiro  . LAPAROSCOPIC PARTIAL NEPHRECTOMY Left April '13   [facility], Dr. Alinda Money  . LUMBAR FUSION  03/2003   L4-5  . TONSILLECTOMY  1958  . TUBAL LIGATION  1984     OB History   No obstetric history on file.     Family History  Problem Relation Age of Onset  . Heart disease Mother   . COPD Mother   . Thyroid disease Mother   . Diabetes Father   . Heart disease Father        CABG  . Stroke Father   . Parkinson's disease Father   . Heart disease Brother   . Stroke Brother   . Hyperlipidemia Brother   . Diabetes Daughter   . Hearing loss Brother   . Crohn's disease Other        neice  . Hypertension Son   . Diabetes Son   . Melanoma Son   . Colon cancer Neg Hx   . Stomach cancer Neg Hx   . Esophageal cancer Neg Hx     Social History   Tobacco Use  . Smoking status: Never Smoker  . Smokeless tobacco: Never Used  Substance Use Topics  . Alcohol use: Yes    Alcohol/week: 0.0 standard drinks    Comment: occasional glass of wine with dinner   . Drug use: No    Home Medications Prior to Admission medications   Medication Sig Start Date End Date Taking? Authorizing Provider  ALPRAZolam Duanne Moron) 0.5 MG tablet Take 1 tablet (0.5 mg total) by mouth 2 (two) times daily as needed. for anxiety 11/14/16   Plotnikov, Evie Lacks, MD  amitriptyline (ELAVIL) 25 MG tablet TAKE 1 TABLET BY MOUTH EVERY DAY AT BEDTIME 10/23/17   Hoyt Koch, MD  amLODipine (NORVASC) 5 MG tablet Take 1 tablet (5 mg total) by mouth daily. 11/07/16   Lauree Chandler, NP  calcium-vitamin D (OSCAL WITH D) 500-200 MG-UNIT per tablet Take 1 tablet by mouth daily.    [provider]  cholecalciferol (VITAMIN D) 1000 UNITS tablet  Take 1,000 Units by mouth daily.    [provider]  doxycycline (VIBRAMYCIN) 100 MG capsule Take 1 capsule (100 mg total) by mouth 2 (two) times daily for 7 days. 12/18/19 12/25/19  Tedd Sias, PA  DULoxetine (CYMBALTA) 60 MG capsule Take 1 capsule by mouth daily. 04/14/18   [provider]  furosemide (LASIX) 20 MG tablet Take 1 tablet (20 mg total) by mouth daily. NEED ANNUAL VISIT FOR FURTHER REFILLS 04/29/17   Hoyt Koch, MD  levothyroxine (SYNTHROID, LEVOTHROID) 75 MCG tablet Take 1 tablet by mouth daily. 02/06/18   [provider]  linaclotide Rolan Lipa) 290 MCG CAPS capsule Take 1 capsule (290 mcg total) by mouth daily before breakfast. 05/01/18   Irene Shipper, MD  losartan (COZAAR) 100 MG tablet Take 1 tablet (100 mg total) by mouth daily. NEED ANNUAL VISIT FOR FURTHER REFILLS 04/29/17 04/29/18  Hoyt Koch, MD  meloxicam (MOBIC) 7.5 MG tablet Take 1 tablet by mouth daily as needed. 03/25/18   [provider]  metoprolol succinate (TOPROL-XL) 50 MG 24 hr tablet Take 1 tablet (50 mg total) by mouth daily. Take with or immediately following a meal. NEED ANNUAL VISIT FOR FURTHER REFILLS 04/29/17   Hoyt Koch, MD  Multiple Vitamins-Minerals (MULTIVITAMIN WITH MINERALS) tablet Take 1 tablet by mouth daily.    [provider]  omeprazole (PRILOSEC) 40 MG capsule Take 1 capsule (40 mg total) by mouth daily. NEED ANNUAL VISIT FOR FURTHER REFILLS 04/29/17   Hoyt Koch, MD  OVER THE COUNTER MEDICATION Tumeric 1 capsule daily.    [provider]  polyethylene glycol powder (GLYCOLAX/MIRALAX) powder DISSOLVE 17 GRAMS INTO LIQUID AND DRINK BY MOUTH EVERY DAY 09/09/16   Hoyt Koch, MD  Probiotic CAPS Take by mouth.    [provider]  traMADol (ULTRAM) 50 MG tablet TAKE 1 TABLET BY MOUTH EVERY 8 HOURS AS NEEDED FOR MODERATE TO SEVERE PAIN. MAY TAKE 1 ADDITIONAL TABLET DAILY AS NEEDED FOR SEVERE PAIN  10/24/16   Hoyt Koch, MD  zoledronic acid (RECLAST) 5 MG/100ML SOLN injection Inject 5 mg into the vein. Yearly    [provider]    Allergies    Morphine and related  Review of Systems   Review of Systems  Constitutional: Negative for fever.  HENT: Negative for congestion.   Respiratory: Negative for shortness of breath.   Cardiovascular: Negative for chest pain.  Gastrointestinal: Negative for abdominal distention.  Skin: Positive for wound. Negative for rash.  Neurological: Negative for dizziness and headaches.    Physical Exam Updated  Vital Signs BP 134/71 (BP Location: Left Arm)   Pulse (!) 51   Temp 98.2 F (36.8 C) (Oral)   Resp 18   Ht 5\' 2"  (1.575 m)   Wt 61.2 kg   SpO2 99%   BMI 24.69 kg/m   Physical Exam Vitals and nursing note reviewed.  Constitutional:      General: She is not in acute distress.    Appearance: Normal appearance. She is not ill-appearing.  HENT:     Head: Normocephalic and atraumatic.     Nose:     Comments: Bridge of nose with circular wound inflicted by punch biopsy.  There is small amount of purulence present.  Erythema surrounding the wound site on all sides by approximately 5 mm.  Mild tenderness to palpation. Eyes:     General: No scleral icterus.       Right eye: No discharge.        Left eye: No discharge.     Conjunctiva/sclera: Conjunctivae normal.  Pulmonary:     Effort: Pulmonary effort is normal.     Breath sounds: No stridor.  Neurological:     Mental Status: She is alert and oriented to person, place, and time. Mental status is at baseline.     ED Results / Procedures / Treatments   Labs (all labs ordered are listed, but only abnormal results are displayed) Labs Reviewed - No data to display  EKG None  Radiology No results found.  Procedures Procedures (including critical care time)  Medications Ordered in ED Medications - No data to display  ED Course  I have reviewed the triage  vital signs and the nursing notes.  Pertinent labs & imaging results that were available during my care of the patient were reviewed by me and considered in my medical decision making (see chart for details).    MDM Rules/Calculators/A&P                      Patient 79 year old female with past medical history detailed above.  History detailed above in HPI.  She has cellulitis of the bridge of her nose after punch biopsy that was done several weeks ago.  It seems to be slowly healing and there is some purulence present on my exam as well as any erythema and tenderness.  She has no systemic symptoms and is well-appearing with normal vital signs apart from very mild bradycardia which is baseline for her.  She denies any systemic symptoms such as fevers, chills, lightheadedness, nausea, vomiting, fatigue or malaise.  I discussed this case with my attending physician who cosigned this note including patient's presenting symptoms, physical exam, and planned diagnostics and interventions. Attending physician stated agreement with plan or made changes to plan which were implemented.   Attending physician assessed patient at bedside.  We will provide patient with doxycycline prescription she will follow up with her dermatologist on Monday.   Final Clinical Impression(s) / ED Diagnoses Final diagnoses:  Visit for wound check  Cellulitis of face    Rx / DC Orders ED Discharge Orders         Ordered    doxycycline (VIBRAMYCIN) 100 MG capsule  2 times daily,   Status:  Discontinued     12/18/19 1036    doxycycline (VIBRAMYCIN) 100 MG capsule  2 times daily     12/18/19 [address] [place], [place] 12/18/19 1041  Truddie Hidden, MD 12/18/19 1945

## 2019-12-18 NOTE — ED Triage Notes (Signed)
She had a precancerous area removed from the bridge of her nose and is concerned the area is infected. Also reports a rash around her mouth.

## 2019-12-18 NOTE — Discharge Instructions (Addendum)
Please take antibiotics as prescribed.  Please follow-up with your dermatologist on Monday.

## 2019-12-29 ENCOUNTER — Ambulatory Visit: Payer: Medicare Other | Admitting: Gastroenterology

## 2020-04-24 ENCOUNTER — Other Ambulatory Visit: Payer: Self-pay | Admitting: Endocrinology

## 2020-04-24 ENCOUNTER — Ambulatory Visit
Admission: RE | Admit: 2020-04-24 | Discharge: 2020-04-24 | Disposition: A | Discharge status: Home / Self Care | Payer: Medicare Other | Source: Ambulatory Visit | Attending: Endocrinology | Admitting: Endocrinology

## 2020-04-24 DIAGNOSIS — G4452 New daily persistent headache (NDPH): Secondary | ICD-10-CM

## 2020-05-11 ENCOUNTER — Emergency Department (HOSPITAL_COMMUNITY): Payer: Medicare Other

## 2020-05-11 ENCOUNTER — Other Ambulatory Visit: Payer: Self-pay

## 2020-05-11 ENCOUNTER — Encounter (HOSPITAL_COMMUNITY): Payer: Self-pay | Admitting: Emergency Medicine

## 2020-05-11 ENCOUNTER — Inpatient Hospital Stay (HOSPITAL_COMMUNITY)
Admission: EM | Admit: 2020-05-11 | Discharge: 2020-05-18 | DRG: 193 | Disposition: A | Discharge status: Home Health | Payer: Medicare Other | Attending: Internal Medicine | Admitting: Internal Medicine

## 2020-05-11 DIAGNOSIS — E860 Dehydration: Secondary | ICD-10-CM | POA: Diagnosis present

## 2020-05-11 DIAGNOSIS — E039 Hypothyroidism, unspecified: Secondary | ICD-10-CM | POA: Diagnosis present

## 2020-05-11 DIAGNOSIS — Z20822 Contact with and (suspected) exposure to covid-19: Secondary | ICD-10-CM | POA: Diagnosis present

## 2020-05-11 DIAGNOSIS — Z90710 Acquired absence of both cervix and uterus: Secondary | ICD-10-CM | POA: Diagnosis not present

## 2020-05-11 DIAGNOSIS — I5023 Acute on chronic systolic (congestive) heart failure: Secondary | ICD-10-CM | POA: Diagnosis not present

## 2020-05-11 DIAGNOSIS — Z79890 Hormone replacement therapy (postmenopausal): Secondary | ICD-10-CM

## 2020-05-11 DIAGNOSIS — Z66 Do not resuscitate: Secondary | ICD-10-CM | POA: Diagnosis present

## 2020-05-11 DIAGNOSIS — J9601 Acute respiratory failure with hypoxia: Secondary | ICD-10-CM | POA: Diagnosis present

## 2020-05-11 DIAGNOSIS — Z905 Acquired absence of kidney: Secondary | ICD-10-CM

## 2020-05-11 DIAGNOSIS — M797 Fibromyalgia: Secondary | ICD-10-CM | POA: Diagnosis present

## 2020-05-11 DIAGNOSIS — Z79899 Other long term (current) drug therapy: Secondary | ICD-10-CM | POA: Diagnosis not present

## 2020-05-11 DIAGNOSIS — Z791 Long term (current) use of non-steroidal anti-inflammatories (NSAID): Secondary | ICD-10-CM | POA: Diagnosis not present

## 2020-05-11 DIAGNOSIS — R413 Other amnesia: Secondary | ICD-10-CM | POA: Diagnosis present

## 2020-05-11 DIAGNOSIS — K219 Gastro-esophageal reflux disease without esophagitis: Secondary | ICD-10-CM | POA: Diagnosis present

## 2020-05-11 DIAGNOSIS — E861 Hypovolemia: Secondary | ICD-10-CM | POA: Diagnosis present

## 2020-05-11 DIAGNOSIS — R296 Repeated falls: Secondary | ICD-10-CM | POA: Diagnosis present

## 2020-05-11 DIAGNOSIS — J189 Pneumonia, unspecified organism: Secondary | ICD-10-CM | POA: Diagnosis present

## 2020-05-11 DIAGNOSIS — I10 Essential (primary) hypertension: Secondary | ICD-10-CM | POA: Diagnosis present

## 2020-05-11 DIAGNOSIS — Z85528 Personal history of other malignant neoplasm of kidney: Secondary | ICD-10-CM

## 2020-05-11 DIAGNOSIS — I739 Peripheral vascular disease, unspecified: Secondary | ICD-10-CM | POA: Diagnosis present

## 2020-05-11 DIAGNOSIS — E871 Hypo-osmolality and hyponatremia: Secondary | ICD-10-CM | POA: Diagnosis present

## 2020-05-11 DIAGNOSIS — S92214A Nondisplaced fracture of cuboid bone of right foot, initial encounter for closed fracture: Secondary | ICD-10-CM | POA: Diagnosis not present

## 2020-05-11 DIAGNOSIS — R0902 Hypoxemia: Secondary | ICD-10-CM

## 2020-05-11 DIAGNOSIS — N179 Acute kidney failure, unspecified: Secondary | ICD-10-CM | POA: Diagnosis present

## 2020-05-11 DIAGNOSIS — G8929 Other chronic pain: Secondary | ICD-10-CM | POA: Diagnosis present

## 2020-05-11 DIAGNOSIS — R638 Other symptoms and signs concerning food and fluid intake: Secondary | ICD-10-CM | POA: Diagnosis not present

## 2020-05-11 DIAGNOSIS — Z9882 Breast implant status: Secondary | ICD-10-CM

## 2020-05-11 DIAGNOSIS — F418 Other specified anxiety disorders: Secondary | ICD-10-CM | POA: Diagnosis present

## 2020-05-11 DIAGNOSIS — R0600 Dyspnea, unspecified: Secondary | ICD-10-CM

## 2020-05-11 LAB — URINALYSIS, ROUTINE W REFLEX MICROSCOPIC
Bilirubin Urine: NEGATIVE
Glucose, UA: NEGATIVE mg/dL
Hgb urine dipstick: NEGATIVE
Ketones, ur: NEGATIVE mg/dL
Nitrite: NEGATIVE
Protein, ur: NEGATIVE mg/dL
Specific Gravity, Urine: 1.011 (ref 1.005–1.030)
pH: 6 (ref 5.0–8.0)

## 2020-05-11 LAB — COMPREHENSIVE METABOLIC PANEL
ALT: 15 U/L (ref 0–44)
AST: 22 U/L (ref 15–41)
Albumin: 3.4 g/dL — ABNORMAL LOW (ref 3.5–5.0)
Alkaline Phosphatase: 50 U/L (ref 38–126)
Anion gap: 10 (ref 5–15)
BUN: 18 mg/dL (ref 8–23)
CO2: 25 mmol/L (ref 22–32)
Calcium: 8.8 mg/dL — ABNORMAL LOW (ref 8.9–10.3)
Chloride: 95 mmol/L — ABNORMAL LOW (ref 98–111)
Creatinine, Ser: 1.14 mg/dL — ABNORMAL HIGH (ref 0.44–1.00)
GFR calc Af Amer: 53 mL/min — ABNORMAL LOW (ref >60)
GFR calc non Af Amer: 46 mL/min — ABNORMAL LOW (ref >60)
Glucose, Bld: 175 mg/dL — ABNORMAL HIGH (ref 70–99)
Potassium: 4.1 mmol/L (ref 3.5–5.1)
Sodium: 130 mmol/L — ABNORMAL LOW (ref 135–145)
Total Bilirubin: 0.8 mg/dL (ref 0.3–1.2)
Total Protein: 6.3 g/dL — ABNORMAL LOW (ref 6.5–8.1)

## 2020-05-11 LAB — CBC
HCT: 32.5 % — ABNORMAL LOW (ref 36.0–46.0)
Hemoglobin: 10.7 g/dL — ABNORMAL LOW (ref 12.0–15.0)
MCH: 31 pg (ref 26.0–34.0)
MCHC: 32.9 g/dL (ref 30.0–36.0)
MCV: 94.2 fL (ref 80.0–100.0)
Platelets: 233 10*3/uL (ref 150–400)
RBC: 3.45 MIL/uL — ABNORMAL LOW (ref 3.87–5.11)
RDW: 12.1 % (ref 11.5–15.5)
WBC: 8 10*3/uL (ref 4.0–10.5)
nRBC: 0 % (ref 0.0–0.2)

## 2020-05-11 LAB — TROPONIN I (HIGH SENSITIVITY)
Troponin I (High Sensitivity): 6 ng/L (ref <18)
Troponin I (High Sensitivity): 5 ng/L (ref <18)

## 2020-05-11 LAB — RESPIRATORY PANEL BY RT PCR (FLU A&B, COVID)
Influenza A by PCR: NEGATIVE
Influenza B by PCR: NEGATIVE
SARS Coronavirus 2 by RT PCR: NEGATIVE

## 2020-05-11 LAB — LACTIC ACID, PLASMA: Lactic Acid, Venous: 1.3 mmol/L (ref 0.5–1.9)

## 2020-05-11 MED ORDER — ENOXAPARIN SODIUM 40 MG/0.4ML ~~LOC~~ SOLN
40.0000 mg | Freq: Every day | SUBCUTANEOUS | Status: DC
  Administered 2020-05-12 – 2020-05-17 (×7): 40 mg via SUBCUTANEOUS
  Filled 2020-05-11 (×7): qty 0.4

## 2020-05-11 MED ORDER — SODIUM CHLORIDE 0.9 % IV SOLN
500.0000 mg | Freq: Once | INTRAVENOUS | Status: AC
  Administered 2020-05-11: 500 mg via INTRAVENOUS
  Filled 2020-05-11: qty 500

## 2020-05-11 MED ORDER — DULOXETINE HCL 60 MG PO CPEP
90.0000 mg | ORAL_CAPSULE | Freq: Every day | ORAL | Status: DC
  Administered 2020-05-12 – 2020-05-14 (×3): 90 mg via ORAL
  Filled 2020-05-11 (×5): qty 1

## 2020-05-11 MED ORDER — TRAMADOL HCL 50 MG PO TABS
50.0000 mg | ORAL_TABLET | Freq: Two times a day (BID) | ORAL | Status: DC | PRN
  Administered 2020-05-12 – 2020-05-17 (×7): 50 mg via ORAL
  Filled 2020-05-11 (×7): qty 1

## 2020-05-11 MED ORDER — SODIUM CHLORIDE 0.9 % IV SOLN
500.0000 mg | INTRAVENOUS | Status: DC
  Administered 2020-05-12 – 2020-05-15 (×3): 500 mg via INTRAVENOUS
  Filled 2020-05-11 (×3): qty 500

## 2020-05-11 MED ORDER — ACETAMINOPHEN 325 MG PO TABS
650.0000 mg | ORAL_TABLET | Freq: Four times a day (QID) | ORAL | Status: DC | PRN
  Administered 2020-05-12 – 2020-05-17 (×8): 650 mg via ORAL
  Filled 2020-05-11 (×9): qty 2

## 2020-05-11 MED ORDER — ALBUTEROL SULFATE HFA 108 (90 BASE) MCG/ACT IN AERS
1.0000 | INHALATION_SPRAY | Freq: Four times a day (QID) | RESPIRATORY_TRACT | Status: DC | PRN
  Filled 2020-05-11: qty 6.7

## 2020-05-11 MED ORDER — SODIUM CHLORIDE 0.9 % IV SOLN
2.0000 g | INTRAVENOUS | Status: AC
  Administered 2020-05-12 – 2020-05-15 (×4): 2 g via INTRAVENOUS
  Filled 2020-05-11 (×4): qty 20

## 2020-05-11 MED ORDER — SODIUM CHLORIDE 0.9 % IV SOLN
1.0000 g | Freq: Once | INTRAVENOUS | Status: AC
  Administered 2020-05-11: 1 g via INTRAVENOUS
  Filled 2020-05-11: qty 10

## 2020-05-11 MED ORDER — GUAIFENESIN ER 600 MG PO TB12
600.0000 mg | ORAL_TABLET | Freq: Two times a day (BID) | ORAL | Status: DC
  Administered 2020-05-11 – 2020-05-15 (×8): 600 mg via ORAL
  Filled 2020-05-11 (×8): qty 1

## 2020-05-11 MED ORDER — SODIUM CHLORIDE 0.9 % IV BOLUS
1000.0000 mL | Freq: Once | INTRAVENOUS | Status: AC
  Administered 2020-05-11: 1000 mL via INTRAVENOUS

## 2020-05-11 MED ORDER — BUPROPION HCL 75 MG PO TABS
75.0000 mg | ORAL_TABLET | Freq: Every morning | ORAL | Status: DC
  Administered 2020-05-12 – 2020-05-18 (×7): 75 mg via ORAL
  Filled 2020-05-11 (×8): qty 1

## 2020-05-11 MED ORDER — ALPRAZOLAM 0.5 MG PO TABS
1.0000 mg | ORAL_TABLET | Freq: Every day | ORAL | Status: DC
  Administered 2020-05-11 – 2020-05-14 (×4): 1 mg via ORAL
  Filled 2020-05-11: qty 4
  Filled 2020-05-11 (×3): qty 2

## 2020-05-11 MED ORDER — LEVOTHYROXINE SODIUM 75 MCG PO TABS
75.0000 ug | ORAL_TABLET | Freq: Every day | ORAL | Status: DC
  Administered 2020-05-12 – 2020-05-18 (×7): 75 ug via ORAL
  Filled 2020-05-11 (×6): qty 1

## 2020-05-11 MED ORDER — ONDANSETRON HCL 4 MG PO TABS: 4.0000 mg | ORAL_TABLET | Freq: Four times a day (QID) | ORAL | Status: DC | PRN

## 2020-05-11 MED ORDER — LACTATED RINGERS IV SOLN: INTRAVENOUS | Status: AC

## 2020-05-11 MED ORDER — ACETAMINOPHEN 650 MG RE SUPP: 650.0000 mg | Freq: Four times a day (QID) | RECTAL | Status: DC | PRN

## 2020-05-11 MED ORDER — IOHEXOL 350 MG/ML SOLN
100.0000 mL | Freq: Once | INTRAVENOUS | Status: AC | PRN
  Administered 2020-05-11: 100 mL via INTRAVENOUS

## 2020-05-11 MED ORDER — SENNOSIDES-DOCUSATE SODIUM 8.6-50 MG PO TABS
1.0000 | ORAL_TABLET | Freq: Every evening | ORAL | Status: DC | PRN
  Administered 2020-05-17: 1 via ORAL
  Filled 2020-05-11: qty 1

## 2020-05-11 MED ORDER — ONDANSETRON HCL 4 MG/2ML IJ SOLN: 4.0000 mg | Freq: Four times a day (QID) | INTRAMUSCULAR | Status: DC | PRN

## 2020-05-11 MED ORDER — MELATONIN 3 MG PO TABS
3.0000 mg | ORAL_TABLET | Freq: Every day | ORAL | Status: DC
  Administered 2020-05-11 – 2020-05-14 (×4): 3 mg via ORAL
  Filled 2020-05-11 (×5): qty 1

## 2020-05-11 MED ORDER — AMITRIPTYLINE HCL 25 MG PO TABS
25.0000 mg | ORAL_TABLET | Freq: Every day | ORAL | Status: DC
  Administered 2020-05-11 – 2020-05-17 (×7): 25 mg via ORAL
  Filled 2020-05-11 (×8): qty 1

## 2020-05-11 MED ORDER — METOPROLOL SUCCINATE ER 25 MG PO TB24: 50.0000 mg | ORAL_TABLET | Freq: Every day | ORAL | Status: DC

## 2020-05-11 NOTE — ED Triage Notes (Signed)
Pt here from home with c/o feeling like she has a fever and generally not feeling well , pt has been vaccinated

## 2020-05-11 NOTE — ED Provider Notes (Signed)
Accepted handoff at shift change from Cortni C PA-C. Please see prior provider note for more detail.   Briefly: Patient is 79 y.o. with history of renal cancer, esophageal stricture, fibromyalgia, reflux, H pylori, hypertension, tremors, pneumonia  Patient is presented today with 3 days of generalized fatigue, chills, subjective fevers, sore throat, shortness of breath, intermittent lower abdominal pain and diarrhea.  She denies vomiting, chest pain, cough lightheadedness or dizziness.  Patient has been so weak after she has had multiple falls including striking her head without losing consciousness and injuring her right foot.   DDX: concern for PE vs PNA vs malignancy   Plan: Follow-up on imaging and disposition appropriately.    Physical Exam  BP (!) 116/56 Comment: MD notified again about 02  Pulse 69 Comment: MD notified again about 02  Temp 99.3 F (37.4 C) (Oral)   Resp (!) 24 Comment: MD notified again about 02  Ht 5\' 2"  (1.575 m)   Wt 62.1 kg   SpO2 (!) 86% Comment: MD notified again about 02  BMI 25.06 kg/m   CONSTITUTIONAL:  well-appearing, NAD NEURO:  Alert and oriented x 3, no focal deficits EYES:  pupils equal and reactive ENT/NECK:  trachea midline, no JVD CARDIO:  reg rate, reg rhythm, well-perfused PULM:  None labored breathing, tachypnea GI/GU:  Abdomen non-distended MSK/SPINE:  No gross deformities, no edema SKIN:  no rash obvious, atraumatic, no ecchymosis  PSYCH:  Appropriate speech and behavior  ED Course/Procedures   .Critical Care Performed by: Tedd Sias, PA Authorized by: Tedd Sias, PA   Critical care provider statement:    Critical care time (minutes):  45   Critical care was necessary to treat or prevent imminent or life-threatening deterioration of the following conditions:  Respiratory failure (Resp failure 2/2 pneumonia)   Critical care was time spent personally by me on the following activities:  Discussions with consultants,  evaluation of patient's response to treatment, examination of patient, ordering and performing treatments and interventions, ordering and review of laboratory studies, ordering and review of radiographic studies, pulse oximetry, re-evaluation of patient's condition, obtaining history from patient or surrogate and review of old charts   Results for orders placed or performed during the hospital encounter of 05/11/20  Respiratory Panel by RT PCR (Flu A&B, Covid) - Nasopharyngeal Swab   Specimen: Nasopharyngeal Swab  Result Value Ref Range   SARS Coronavirus 2 by RT PCR NEGATIVE NEGATIVE   Influenza A by PCR NEGATIVE NEGATIVE   Influenza B by PCR NEGATIVE NEGATIVE  CBC  Result Value Ref Range   WBC 8.0 4.0 - 10.5 K/uL   RBC 3.45 (L) 3.87 - 5.11 MIL/uL   Hemoglobin 10.7 (L) 12.0 - 15.0 g/dL   HCT 32.5 (L) 36 - 46 %   MCV 94.2 80.0 - 100.0 fL   MCH 31.0 26.0 - 34.0 pg   MCHC 32.9 30.0 - 36.0 g/dL   RDW 12.1 11.5 - 15.5 %   Platelets 233 150 - 400 K/uL   nRBC 0.0 0.0 - 0.2 %  Comprehensive metabolic panel  Result Value Ref Range   Sodium 130 (L) 135 - 145 mmol/L   Potassium 4.1 3.5 - 5.1 mmol/L   Chloride 95 (L) 98 - 111 mmol/L   CO2 25 22 - 32 mmol/L   Glucose, Bld 175 (H) 70 - 99 mg/dL   BUN 18 8 - 23 mg/dL   Creatinine, Ser 1.14 (H) 0.44 - 1.00 mg/dL   Calcium 8.8 (  L) 8.9 - 10.3 mg/dL   Total Protein 6.3 (L) 6.5 - 8.1 g/dL   Albumin 3.4 (L) 3.5 - 5.0 g/dL   AST 22 15 - 41 U/L   ALT 15 0 - 44 U/L   Alkaline Phosphatase 50 38 - 126 U/L   Total Bilirubin 0.8 0.3 - 1.2 mg/dL   GFR calc non Af Amer 46 (L) >60 mL/min   GFR calc Af Amer 53 (L) >60 mL/min   Anion gap 10 5 - 15  Urinalysis, Routine w reflex microscopic  Result Value Ref Range   Color, Urine YELLOW YELLOW   APPearance HAZY (A) CLEAR   Specific Gravity, Urine 1.011 1.005 - 1.030   pH 6.0 5.0 - 8.0   Glucose, UA NEGATIVE NEGATIVE mg/dL   Hgb urine dipstick NEGATIVE NEGATIVE   Bilirubin Urine NEGATIVE NEGATIVE    Ketones, ur NEGATIVE NEGATIVE mg/dL   Protein, ur NEGATIVE NEGATIVE mg/dL   Nitrite NEGATIVE NEGATIVE   Leukocytes,Ua TRACE (A) NEGATIVE   RBC / HPF 0-5 0 - 5 RBC/hpf   WBC, UA 0-5 0 - 5 WBC/hpf   Bacteria, UA RARE (A) NONE SEEN   Mucus PRESENT    Amorphous [name] PRESENT   Lactic acid, plasma  Result Value Ref Range   Lactic Acid, Venous 1.3 0.5 - 1.9 mmol/L  Troponin I (High Sensitivity)  Result Value Ref Range   Troponin I (High Sensitivity) 6 <18 ng/L  Troponin I (High Sensitivity)  Result Value Ref Range   Troponin I (High Sensitivity) 5 <18 ng/L   CT Head Wo Contrast  Result Date: 05/11/2020 CLINICAL DATA:  Head trauma.  Multiple falls.  Head injury. EXAM: CT HEAD WITHOUT CONTRAST TECHNIQUE: Contiguous axial images were obtained from the base of the skull through the vertex without intravenous contrast. COMPARISON:  04/24/2020 FINDINGS: Brain: Stable degree of atrophy and chronic small vessel ischemia. No intracranial hemorrhage, mass effect, or midline shift. No hydrocephalus. The basilar cisterns are patent. No evidence of territorial infarct or acute ischemia. No extra-axial or intracranial fluid collection. Vascular: No hyperdense vessel. Air in the right cavernous sinus is typically related to IV catheter placement. Skull: No acute fracture. Stable small incidental bilateral skull exostosis. Sinuses/Orbits: Paranasal sinuses and mastoid air cells are clear. The visualized orbits are unremarkable. Bilateral lens resection. Other: None. IMPRESSION: 1. No acute intracranial abnormality. No skull fracture. 2. Stable atrophy and chronic small vessel ischemia. Electronically Signed   By: Keith Rake M.D.   On: 05/11/2020 20:10   CT HEAD WO CONTRAST  Result Date: 04/24/2020 CLINICAL DATA:  Constant headaches for 3 weeks. History of hypertension and partial nephrectomy. EXAM: CT HEAD WITHOUT CONTRAST TECHNIQUE: Contiguous axial images were obtained from the base of the skull through  the vertex without intravenous contrast. COMPARISON:  09/30/2018 FINDINGS: Brain: No evidence of acute infarction, hemorrhage, hydrocephalus, extra-axial collection or mass lesion/mass effect. There is prominence of the sulci compatible with age related parenchymal atrophy. Mild low attenuation within the subcortical and periventricular white matter consistent with chronic small vessel ischemic change. Vascular: No hyperdense vessel or unexpected calcification. Skull: Normal. Negative for fracture or focal lesion. Sinuses/Orbits: No acute finding. Other: None. IMPRESSION: 1. No acute intracranial abnormalities. 2. Chronic small vessel ischemic change and parenchymal atrophy. Electronically Signed   By: Kerby Moors M.D.   On: 04/24/2020 12:00   CT Angio Chest PE W and/or Wo Contrast  Result Date: 05/11/2020 CLINICAL DATA:  Abnormal chest radiograph, fever, abdominal pain, left  lower quadrant pain EXAM: CT ANGIOGRAPHY CHEST CT ABDOMEN AND PELVIS WITH CONTRAST TECHNIQUE: Multidetector CT imaging of the chest was performed using the standard protocol during bolus administration of intravenous contrast. Multiplanar CT image reconstructions and MIPs were obtained to evaluate the vascular anatomy. Multidetector CT imaging of the abdomen and pelvis was performed using the standard protocol during bolus administration of intravenous contrast. CONTRAST:  138mL OMNIPAQUE IOHEXOL 350 MG/ML SOLN COMPARISON:  Chest radiograph 05/11/2020, CT abdomen pelvis 04/07/2018 thyroid ultrasound 01/16/2019, MR abdomen 10/08/2011 FINDINGS: CTA CHEST FINDINGS Cardiovascular: Satisfactory opacification the pulmonary arteries to the segmental level. No pulmonary artery filling defects are identified. Some mild dilatation of the left and right main pulmonary arteries, can reflect a degree of pulmonary artery hypertension. Cardiac size is upper limits normal. No pericardial effusion. Few scattered coronary artery calcifications. The aortic  root is suboptimally assessed given cardiac pulsation artifact. Atherosclerotic plaque within the normal caliber aorta. No acute luminal abnormality of the imaged aorta. No periaortic stranding or hemorrhage. Shared origin of the brachiocephalic and left common carotid arteries. Proximal great vessels are otherwise unremarkable. Mediastinum/Nodes: No mediastinal fluid or gas. Few subcentimeter thyroid nodules in the right lobe of the gland have previously been seen comparison ultrasound and warrant no further evaluation. No acute abnormality of the trachea or thoracic esophagus. Few scattered calcified mediastinal and hilar nodes are present, may reflect sequela of prior granulomatous disease. No worrisome enlarged mediastinal, hilar or axillary adenopathy. Lungs/Pleura: There is masslike consolidative opacity situated in the posterior segment of the right upper lobe with some surrounding areas of ground-glass attenuation and extension across the minor fissure into the medial segment right middle lobe as well (4/67). While several of the airways truncated in this region there is no significant narrowing of vessels or proximal hilar airways. Some additional patchy opacity and ground-glass is present in the superior segment right lower lobe separate from this larger focal consolidation (4/71). Separate 4 mm nodule present in the left lung apex 4/23). Some mild pleural thickening is seen along both the major and minor fissures of the right lung. Mild interlobular septal thickening is present as well. A small right pleural effusion is present as well with dependent atelectasis. Musculoskeletal: Cervical spondylitic changes including some possible degenerative bony fusion C6-7. Additional mild-to-moderate spondylitic changes in the thoracic spine. No acute or worrisome osseous lesion. 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 Review of the MIP  images confirms the above findings. CT ABDOMEN and PELVIS FINDINGS Hepatobiliary: No worrisome focal liver lesions. Smooth liver surface contour. Normal hepatic attenuation. Prominent fold at the gallbladder body. Gallbladder distention is within physiologic normal. No visible calcified gallstones. No pericholecystic inflammation. No biliary ductal dilatation. Pancreas: Moderate pancreatic atrophy. Otherwise unremarkable. No pancreatic ductal dilatation or surrounding inflammatory changes. Spleen: Normal in size. No concerning splenic lesions. Adrenals/Urinary Tract: Normal adrenal glands. Kidneys enhance and excrete symmetrically. Stable bandlike region of scarring in the interpolar left kidney. No concerning renal mass. Bilateral extrarenal pelves are similar to comparison. No frank hydronephrosis or urolithiasis. Urinary bladder is unremarkable. Stomach/Bowel: Small sliding-type hiatal hernia. Distal stomach and duodenum are unremarkable. No small bowel thickening or dilatation. A normal appendix is visualized. Moderate colonic stool burden. No colonic dilatation or wall thickening. No evidence of bowel obstruction. Vascular/Lymphatic: Atherosclerotic calcifications within the abdominal aorta and branch vessels. No aneurysm or ectasia. No enlarged abdominopelvic lymph nodes. Reproductive: Uterus is surgically absent. No concerning adnexal lesions. Other: No abdominopelvic free fluid  or free gas. No bowel containing hernias. Musculoskeletal: Stepwise retrolisthesis L1-L4. No associated spondylolysis. Prior L4-5 posterior decompression, fusion and interbody spacer placement. Some mild degenerative changes seen at the SI joints, left greater than right, and bilateral hips. No acute or worrisome osseous lesions. Review of the MIP images confirms the above findings. IMPRESSION: 1. No evidence of acute pulmonary embolism. 2. Masslike opacity predominantly in the posterior segment of the right upper lobe with some  surrounding areas of ground-glass attenuation and extension across the minor fissure into the medial segment right middle lobe and superior segment right lower lobe. Given air bronchograms, additional patchy opacity and ground-glass in the right lower lobe and the setting of fever, appearance is most concerning for pneumonia though an underlying malignancy cannot be excluded. Recommend follow-up to resolution. 3. Suspect some mild interstitial edematous features as well with interlobular septal thickening and vascular redistribution. 4. Small right pleural effusion. 5. No acute findings in the abdomen or pelvis. 6. Moderate colonic stool burden. Correlate for features of constipation. 7. 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 Consider referral for outpatient breast imaging if not performed recently. 8. Aortic Atherosclerosis (ICD10-I70.0). 9. Coronary artery calcifications are present. Please note that the presence of coronary artery calcium documents the presence of coronary artery disease, the severity of this disease and any potential stenosis cannot be assessed on this non-gated CT examination. Electronically Signed   By: Lovena Le M.D.   On: 05/11/2020 20:28   CT ABDOMEN PELVIS W CONTRAST  Result Date: 05/11/2020 CLINICAL DATA:  Abnormal chest radiograph, fever, abdominal pain, left lower quadrant pain EXAM: CT ANGIOGRAPHY CHEST CT ABDOMEN AND PELVIS WITH CONTRAST TECHNIQUE: Multidetector CT imaging of the chest was performed using the standard protocol during bolus administration of intravenous contrast. Multiplanar CT image reconstructions and MIPs were obtained to evaluate the vascular anatomy. Multidetector CT imaging of the abdomen and pelvis was performed using the standard protocol during bolus administration of intravenous contrast. CONTRAST:  120mL OMNIPAQUE IOHEXOL 350 MG/ML SOLN COMPARISON:  Chest radiograph  05/11/2020, CT abdomen pelvis 04/07/2018 thyroid ultrasound 01/16/2019, MR abdomen 10/08/2011 FINDINGS: CTA CHEST FINDINGS Cardiovascular: Satisfactory opacification the pulmonary arteries to the segmental level. No pulmonary artery filling defects are identified. Some mild dilatation of the left and right main pulmonary arteries, can reflect a degree of pulmonary artery hypertension. Cardiac size is upper limits normal. No pericardial effusion. Few scattered coronary artery calcifications. The aortic root is suboptimally assessed given cardiac pulsation artifact. Atherosclerotic plaque within the normal caliber aorta. No acute luminal abnormality of the imaged aorta. No periaortic stranding or hemorrhage. Shared origin of the brachiocephalic and left common carotid arteries. Proximal great vessels are otherwise unremarkable. Mediastinum/Nodes: No mediastinal fluid or gas. Few subcentimeter thyroid nodules in the right lobe of the gland have previously been seen comparison ultrasound and warrant no further evaluation. No acute abnormality of the trachea or thoracic esophagus. Few scattered calcified mediastinal and hilar nodes are present, may reflect sequela of prior granulomatous disease. No worrisome enlarged mediastinal, hilar or axillary adenopathy. Lungs/Pleura: There is masslike consolidative opacity situated in the posterior segment of the right upper lobe with some surrounding areas of ground-glass attenuation and extension across the minor fissure into the medial segment right middle lobe as well (4/67). While several of the airways truncated in this region there is no significant narrowing of vessels or proximal hilar airways. Some additional patchy opacity and ground-glass is present in the superior segment  right lower lobe separate from this larger focal consolidation (4/71). Separate 4 mm nodule present in the left lung apex 4/23). Some mild pleural thickening is seen along both the major and minor  fissures of the right lung. Mild interlobular septal thickening is present as well. A small right pleural effusion is present as well with dependent atelectasis. Musculoskeletal: Cervical spondylitic changes including some possible degenerative bony fusion C6-7. Additional mild-to-moderate spondylitic changes in the thoracic spine. No acute or worrisome osseous lesion. 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 Review of the MIP images confirms the above findings. CT ABDOMEN and PELVIS FINDINGS Hepatobiliary: No worrisome focal liver lesions. Smooth liver surface contour. Normal hepatic attenuation. Prominent fold at the gallbladder body. Gallbladder distention is within physiologic normal. No visible calcified gallstones. No pericholecystic inflammation. No biliary ductal dilatation. Pancreas: Moderate pancreatic atrophy. Otherwise unremarkable. No pancreatic ductal dilatation or surrounding inflammatory changes. Spleen: Normal in size. No concerning splenic lesions. Adrenals/Urinary Tract: Normal adrenal glands. Kidneys enhance and excrete symmetrically. Stable bandlike region of scarring in the interpolar left kidney. No concerning renal mass. Bilateral extrarenal pelves are similar to comparison. No frank hydronephrosis or urolithiasis. Urinary bladder is unremarkable. Stomach/Bowel: Small sliding-type hiatal hernia. Distal stomach and duodenum are unremarkable. No small bowel thickening or dilatation. A normal appendix is visualized. Moderate colonic stool burden. No colonic dilatation or wall thickening. No evidence of bowel obstruction. Vascular/Lymphatic: Atherosclerotic calcifications within the abdominal aorta and branch vessels. No aneurysm or ectasia. No enlarged abdominopelvic lymph nodes. Reproductive: Uterus is surgically absent. No concerning adnexal lesions. Other: No abdominopelvic free fluid or free gas. No bowel containing  hernias. Musculoskeletal: Stepwise retrolisthesis L1-L4. No associated spondylolysis. Prior L4-5 posterior decompression, fusion and interbody spacer placement. Some mild degenerative changes seen at the SI joints, left greater than right, and bilateral hips. No acute or worrisome osseous lesions. Review of the MIP images confirms the above findings. IMPRESSION: 1. No evidence of acute pulmonary embolism. 2. Masslike opacity predominantly in the posterior segment of the right upper lobe with some surrounding areas of ground-glass attenuation and extension across the minor fissure into the medial segment right middle lobe and superior segment right lower lobe. Given air bronchograms, additional patchy opacity and ground-glass in the right lower lobe and the setting of fever, appearance is most concerning for pneumonia though an underlying malignancy cannot be excluded. Recommend follow-up to resolution. 3. Suspect some mild interstitial edematous features as well with interlobular septal thickening and vascular redistribution. 4. Small right pleural effusion. 5. No acute findings in the abdomen or pelvis. 6. Moderate colonic stool burden. Correlate for features of constipation. 7. 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 Consider referral for outpatient breast imaging if not performed recently. 8. Aortic Atherosclerosis (ICD10-I70.0). 9. Coronary artery calcifications are present. Please note that the presence of coronary artery calcium documents the presence of coronary artery disease, the severity of this disease and any potential stenosis cannot be assessed on this non-gated CT examination. Electronically Signed   By: Lovena Le M.D.   On: 05/11/2020 20:28   DG Chest Portable 1 View  Result Date: 05/11/2020 CLINICAL DATA:  Weakness. EXAM: PORTABLE CHEST 1 VIEW COMPARISON:  January 14, 2017. FINDINGS: The heart size and mediastinal  contours are within normal limits. No pneumothorax or pleural effusion is noted. Left lung is clear. Mild right infrahilar opacity is noted which may represent pneumonia, but neoplasm cannot be excluded.  The visualized skeletal structures are unremarkable. IMPRESSION: Mild right infrahilar opacity is noted which may represent pneumonia, but neoplasm cannot be excluded. CT scan of the chest with intravenous contrast is recommended for further evaluation. Electronically Signed   By: Marijo Conception M.D.   On: 05/11/2020 13:10   DG Foot Complete Right  Result Date: 05/11/2020 CLINICAL DATA:  Status post fall. EXAM: RIGHT FOOT COMPLETE - 3+ VIEW COMPARISON:  None. FINDINGS: A very small area of cortical irregularity is seen along the plantar aspect of the right cuboid bone. Adjacent 3 mm curvilinear cortical density is noted. Mild degenerative changes seen along the metatarsophalangeal articulation of the right great toe. An associated hallux valgus deformity is present. Mild dorsal soft tissue swelling is seen along the mid right foot. IMPRESSION: Very small fracture of indeterminate age along the plantar aspect of the right cuboid bone. Correlation with physical examination of this region is recommended to determine the presence or absence of point tenderness. Electronically Signed   By: Virgina Norfolk M.D.   On: 05/11/2020 18:37      MDM     Clinical Course as of May 11 2220  Thu May 11, 2020  2058 CT Chest/Abd/Pelvis reviewed. Agree with radiology review. Likely pneumonia but cannot rule out tumor.  2. Masslike opacity predominantly in the posterior segment of the right upper lobe with some surrounding areas of ground-glass attenuation and extension across the minor fissure into the medial segment right middle lobe and superior segment right lower lobe. Given air bronchograms, additional patchy opacity and ground-glass in the right lower lobe and the setting of fever, appearance is  most concerning for pneumonia though an underlying malignancy cannot be excluded. Recommend follow-up to resolution. 3. Suspect some mild interstitial edematous features as well with interlobular septal thickening and vascular redistribution. 4. Small right pleural effusion. 5. No acute findings in the abdomen or pelvis. 6. Moderate colonic stool burden. Correlate for features of constipation. 7. 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 Consider referral for outpatient breast imaging if not performed recently. 8. Aortic Atherosclerosis (ICD10-I70.0). 9. Coronary artery calcifications are present. Please note that the presence of coronary artery calcium documents the presence of coronary artery disease, the severity of this disease and any potential stenosis cannot be assessed on this non-gated CT examination.   [WF]  2100 CT head Without acute abn: 1. No acute intracranial abnormality. No skull fracture. 2. Stable atrophy and chronic small vessel ischemia.   [WF]  2100 Xray of foot with fracture along right cuboid bone.   Very small fracture of indeterminate age along the plantar aspect of the right cuboid bone. Correlation with physical examination of this region is recommended to determine the presence or absence of point tenderness.    [WF]  2217 Discussed with Dr. Posey Pronto of internal medicine who will admit patient for hypoxic respiratory failure secondary to pneumonia.  They are understanding of the patient's family is mentioning lab work.  I discussed with patient and patient family member the abnormal CT scan that we will need to follow-up with CT imaging once symptoms have resolved.   [WF]    Clinical Course User Index [WF] Tedd Sias, [place]   Patient mated for hypoxia secondary to pneumonia.     Pati Gallo [place], [place] 05/11/20 2309    Lacretia Leigh, MD 05/13/20 [phone]

## 2020-05-11 NOTE — ED Notes (Signed)
Patient mildly hypoxic at times and tachypneic.  PA aware.

## 2020-05-11 NOTE — ED Provider Notes (Signed)
Medical screening examination/treatment/procedure(s) were conducted as a shared visit with non-physician practitioner(s) and myself.  I personally evaluated the patient during the encounter.  EKG Interpretation  Date/Time:  Thursday May 11 2020 11:43:48 EDT Ventricular Rate:  62 PR Interval:  176 QRS Duration: 76 QT Interval:  372 QTC Calculation: 377 R Axis:   66 Text Interpretation: Normal sinus rhythm Normal ECG Confirmed by Lacretia Leigh ([id number]) on 05/11/2020 8:17:52 PM  79 year old female presents with generalized malaise and weakness.  CT scan of the chest shows pneumonia.  Started on IV antibiotics.  Patient has an oxygen requirement and will require admission.   Lacretia Leigh, MD 05/11/20 [id number]

## 2020-05-11 NOTE — H&P (Signed)
History and Physical    [name] WVP:[id number] DOB: Dec 25, 1940 DOA: 05/11/2020  PCP: Reynold Bowen, MD  Patient coming from: Home  I have personally briefly reviewed patient's old medical records in [place]  Chief Complaint: Generalized weakness, fever, chills  HPI: [name] is a 79 y.o. female with medical history significant for HTN, GERD, hypothyroidism, depression/anxiety, and chronic back pain who presents to the ED for evaluation of fevers, chills, and malaise.  Patient states she was in her usual state of health until 3 days ago when she began to have new onset of generalized weakness.  She has had progressive fatigue, malaise, chills, and decreased exercise tolerance since then.  She reports chronic constipation but has had some loose and soft stools recently.   Today she developed new cough which she says feels wet but is unable to bring up any sputum.  She says she has not really had any shortness of breath.  She has not had any chest pain, abdominal pain, nausea, vomiting, or dysuria.  She denies any difficulty with swallowing foods or liquids.  She has noticed a recent bruise on the dorsal lateral aspect of the right foot which appears to be healing.  She denies any pain at her foot at rest or with weightbearing.  ED Course:  Initial vitals showed BP 87/45, pulse 65, RR 18, temp 99.3 Fahrenheit, SPO2 97% on room air. While in the ED patient was noted to become hypoxic with SPO2 86% on room air, improved to >92% on 2 L O2 via [place]. Also tachypneic with RR up to 29.  Labs show WBC 8.0, hemoglobin 10.7, platelets 233,000, sodium 130, potassium 4.1, bicarb 25, BUN 18, creatinine 1.14, serum glucose 175, LFTs within normal limits, high-sensitivity troponin I 6 > 5, lactic acid 1.3.  Urinalysis shows negative nitrites, trace leukocytes, 0-5 RBCs and WBCs per hpf, rare bacteria microscopy. Blood cultures were ordered and pending.  Respiratory PCR panel was  negative for SARS-CoV-2, influenza A, and influenza B.  Portable chest x-ray showed mild right infrahilar opacity.  CTA chest/abdomen/pelvis showed masslike opacity in the posterior segment of the right upper lobe with surrounding groundglass attenuation and extension across the minor fissure into the medial right middle lobe and superior right lower lobe suspicious for pneumonia, malignancy cannot be excluded. Small right pleural effusion also seen. No acute findings in the abdomen or pelvis noted. Bilateral breast prosthesis also seen with sites concerning for intracapsular rupture bilaterally. Aortic atherosclerosis and coronary artery calcifications present.  CT head without contrast was negative for acute intracranial abnormality or skull fracture. Stable atrophy and chronic small vessel ischemia seen.  Right foot x-ray showed very small fracture of indeterminate age along the plantar aspect of the right cuboid bone.  Patient was given 1 L normal saline and started on IV ceftriaxone and azithromycin. The hospitalist service was consulted to admit for further evaluation and management.  Review of Systems: All systems reviewed and are negative except as documented in history of present illness above.   Past Medical History:  Diagnosis Date  . Cancer ([place]) 2014   kidney  . Esophageal stricture   . Fibromyalgia   . GERD (gastroesophageal reflux disease)   . Helicobacter pylori gastritis   . Hypertension   . Intention tremor   . Lumbago   . Osteoarthritis of spine   . Pneumonia    hx of years ago   . PUD (peptic ulcer disease)   . PVD (peripheral  vascular disease) [facility])     Past Surgical History:  Procedure Laterality Date  . ABDOMINAL HYSTERECTOMY  1985  . BREAST ENHANCEMENT SURGERY  1974  . CATARACT EXTRACTION Bilateral 2014   shapiro  . LAPAROSCOPIC PARTIAL NEPHRECTOMY Left April '13   [facility], Dr. Alinda Money  . LUMBAR FUSION  03/2003   L4-5  . TONSILLECTOMY  1958  . TUBAL  LIGATION  1984    Social History:  reports that she has never smoked. She has never used smokeless tobacco. She reports current alcohol use. She reports that she does not use drugs.  Allergies  Allergen Reactions  . Morphine And Related Itching and Rash    Given in IV.    Family History  Problem Relation Age of Onset  . Heart disease Mother   . COPD Mother   . Thyroid disease Mother   . Diabetes Father   . Heart disease Father        CABG  . Stroke Father   . Parkinson's disease Father   . Heart disease Brother   . Stroke Brother   . Hyperlipidemia Brother   . Diabetes Daughter   . Hearing loss Brother   . Crohn's disease Other        neice  . Hypertension Son   . Diabetes Son   . Melanoma Son   . Colon cancer Neg Hx   . Stomach cancer Neg Hx   . Esophageal cancer Neg Hx      Prior to Admission medications   Medication Sig Start Date End Date Taking? Authorizing Provider  acetaminophen (TYLENOL) 325 MG tablet Take 650 mg by mouth every 6 (six) hours as needed for headache.   Yes [provider]  ALPRAZolam (XANAX) 0.5 MG tablet Take 1 tablet (0.5 mg total) by mouth 2 (two) times daily as needed. for anxiety Patient taking differently: Take 1 mg by mouth at bedtime.  11/14/16  Yes Plotnikov, Evie Lacks, MD  amitriptyline (ELAVIL) 25 MG tablet TAKE 1 TABLET BY MOUTH EVERY DAY AT BEDTIME Patient taking differently: Take 25 mg by mouth at bedtime.  10/23/17  Yes Hoyt Koch, MD  amLODipine (NORVASC) 5 MG tablet Take 1 tablet (5 mg total) by mouth daily. 11/07/16  Yes Lauree Chandler, NP  Artificial Tear Ointment (DRY EYES OP) Apply 1 drop to eye 3 (three) times daily as needed (for dry eyes).   Yes [provider]  buPROPion (WELLBUTRIN) 75 MG tablet Take 75 mg by mouth every morning. 02/28/20  Yes [provider]  calcium-vitamin D (OSCAL WITH D) 500-200 MG-UNIT per tablet Take 1 tablet by mouth daily.   Yes [provider]   cholecalciferol (VITAMIN D) 1000 UNITS tablet Take 1,000 Units by mouth daily.   Yes [provider]  DULoxetine (CYMBALTA) 30 MG capsule Take 30 mg by mouth See admin instructions. Take 1 capsule (30 mg) combine with 1 capsule (60 mg) by mouth daily   Yes [provider]  DULoxetine (CYMBALTA) 60 MG capsule Take 60 mg by mouth See admin instructions. Take 1 capsule (60 mg) combine with 1 capsule (30 mg) by mouth daily 04/14/18  Yes [provider]  furosemide (LASIX) 20 MG tablet Take 1 tablet (20 mg total) by mouth daily. NEED ANNUAL VISIT FOR FURTHER REFILLS Patient taking differently: Take 20 mg by mouth daily.  04/29/17  Yes Hoyt Koch, MD  levothyroxine (SYNTHROID, LEVOTHROID) 75 MCG tablet Take 75 mcg by mouth daily.  02/06/18  Yes [provider]  losartan (COZAAR) 100 MG tablet Take 1 tablet (100 mg total) by mouth daily. NEED ANNUAL VISIT FOR FURTHER REFILLS 04/29/17 05/11/20 Yes Hoyt Koch, MD  melatonin 3 MG TABS tablet Take 3 mg by mouth at bedtime.   Yes [provider]  meloxicam (MOBIC) 7.5 MG tablet Take 7.5 mg by mouth daily.  03/25/18  Yes [provider]  metoprolol succinate (TOPROL-XL) 50 MG 24 hr tablet Take 1 tablet (50 mg total) by mouth daily. Take with or immediately following a meal. NEED ANNUAL VISIT FOR FURTHER REFILLS 04/29/17  Yes Hoyt Koch, MD  Multiple Vitamins-Minerals (MULTIVITAMIN WITH MINERALS) tablet Take 1 tablet by mouth daily.   Yes [provider]  omeprazole (PRILOSEC) 40 MG capsule Take 1 capsule (40 mg total) by mouth daily. NEED ANNUAL VISIT FOR FURTHER REFILLS Patient taking differently: Take 40 mg by mouth daily.  04/29/17  Yes Hoyt Koch, MD  polyethylene glycol powder (GLYCOLAX/MIRALAX) powder DISSOLVE 17 GRAMS INTO LIQUID AND DRINK BY MOUTH EVERY DAY Patient taking differently: Take 1 Container by mouth daily.  09/09/16  Yes Hoyt Koch, MD   RESTASIS 0.05 % ophthalmic emulsion Place 1 drop into both eyes 2 (two) times daily. 03/07/20  Yes [provider]  traMADol (ULTRAM) 50 MG tablet TAKE 1 TABLET BY MOUTH EVERY 8 HOURS AS NEEDED FOR MODERATE TO SEVERE PAIN. MAY TAKE 1 ADDITIONAL TABLET DAILY AS NEEDED FOR SEVERE PAIN Patient taking differently: Take 50 mg by mouth 2 (two) times daily.  10/24/16  Yes Hoyt Koch, MD  linaclotide Rolan Lipa) 290 MCG CAPS capsule Take 1 capsule (290 mcg total) by mouth daily before breakfast. Patient not taking: Reported on 05/11/2020 05/01/18   Irene Shipper, MD    Physical Exam: Vitals:   05/11/20 1915 05/11/20 2030 05/11/20 2045 05/11/20 2130  BP: (!) 120/56 (!) 123/56 (!) 119/58 (!) 116/56  Pulse: 69 71 70 69  Resp: (!) 27 (!) 24 (!) 24 (!) 24  Temp:      TempSrc:      SpO2: 98% 93% (!) 88% (!) 86%  Weight:      Height:       Constitutional: Resting supine in bed, NAD, calm, comfortable Eyes: PERRL, lids and conjunctivae normal ENMT: Mucous membranes are moist. Posterior pharynx clear of any exudate or lesions.Normal dentition.  Neck: normal, supple, no masses. Respiratory: Inspiratory crackles right upper lung field otherwise clear to auscultation. Normal respiratory effort. No accessory muscle use.  Cardiovascular: Regular rate and rhythm, no murmurs / rubs / gallops. No extremity edema. 2+ pedal pulses. Abdomen: no tenderness, no masses palpated. No hepatosplenomegaly. Bowel sounds positive.  Musculoskeletal: No tenderness to palpation on right foot.  No clubbing / cyanosis. No joint deformity upper and lower extremities. Good ROM, no contractures. Normal muscle tone.  Skin: Diaphoretic.  Late stage bruising dorsal lateral aspect of right foot.  No rashes, lesions, ulcers. No induration Neurologic: CN 2-12 grossly intact. Sensation intact, Strength 5/5 in all 4.  Psychiatric: Normal judgment and insight. Alert and oriented x 3. Normal mood.   Labs on Admission: I have  personally reviewed following labs and imaging studies  CBC: Recent Labs  Lab 05/11/20 1203  WBC 8.0  HGB 10.7*  HCT 32.5*  MCV 94.2  PLT 102   Basic Metabolic Panel: Recent Labs  Lab 05/11/20 1203  NA 130*  K 4.1  CL 95*  CO2 25  GLUCOSE  175*  BUN 18  CREATININE 1.14*  CALCIUM 8.8*   GFR: Estimated Creatinine Clearance: 34.7 mL/min (A) (by C-G formula based on SCr of 1.14 mg/dL (H)). Liver Function Tests: Recent Labs  Lab 05/11/20 1203  AST 22  ALT 15  ALKPHOS 50  BILITOT 0.8  PROT 6.3*  ALBUMIN 3.4*   No results for input(s): LIPASE, AMYLASE in the last 168 hours. No results for input(s): AMMONIA in the last 168 hours. Coagulation Profile: No results for input(s): INR, PROTIME in the last 168 hours. Cardiac Enzymes: No results for input(s): CKTOTAL, CKMB, CKMBINDEX, TROPONINI in the last 168 hours. BNP (last 3 results) No results for input(s): PROBNP in the last 8760 hours. HbA1C: No results for input(s): HGBA1C in the last 72 hours. CBG: No results for input(s): GLUCAP in the last 168 hours. Lipid Profile: No results for input(s): CHOL, HDL, LDLCALC, TRIG, CHOLHDL, LDLDIRECT in the last 72 hours. Thyroid Function Tests: No results for input(s): TSH, T4TOTAL, FREET4, T3FREE, THYROIDAB in the last 72 hours. Anemia Panel: No results for input(s): VITAMINB12, FOLATE, FERRITIN, TIBC, IRON, RETICCTPCT in the last 72 hours. Urine analysis:    Component Value Date/Time   COLORURINE YELLOW 05/11/2020 1925   APPEARANCEUR HAZY (A) 05/11/2020 1925   LABSPEC 1.011 05/11/2020 1925   PHURINE 6.0 05/11/2020 1925   GLUCOSEU NEGATIVE 05/11/2020 1925   GLUCOSEU NEGATIVE 05/06/2016 1708   HGBUR NEGATIVE 05/11/2020 1925   BILIRUBINUR NEGATIVE 05/11/2020 1925   KETONESUR NEGATIVE 05/11/2020 1925   PROTEINUR NEGATIVE 05/11/2020 1925   UROBILINOGEN 0.2 05/06/2016 1708   NITRITE NEGATIVE 05/11/2020 1925   LEUKOCYTESUR TRACE (A) 05/11/2020 1925    Radiological Exams  on Admission: CT Head Wo Contrast  Result Date: 05/11/2020 CLINICAL DATA:  Head trauma.  Multiple falls.  Head injury. EXAM: CT HEAD WITHOUT CONTRAST TECHNIQUE: Contiguous axial images were obtained from the base of the skull through the vertex without intravenous contrast. COMPARISON:  04/24/2020 FINDINGS: Brain: Stable degree of atrophy and chronic small vessel ischemia. No intracranial hemorrhage, mass effect, or midline shift. No hydrocephalus. The basilar cisterns are patent. No evidence of territorial infarct or acute ischemia. No extra-axial or intracranial fluid collection. Vascular: No hyperdense vessel. Air in the right cavernous sinus is typically related to IV catheter placement. Skull: No acute fracture. Stable small incidental bilateral skull exostosis. Sinuses/Orbits: Paranasal sinuses and mastoid air cells are clear. The visualized orbits are unremarkable. Bilateral lens resection. Other: None. IMPRESSION: 1. No acute intracranial abnormality. No skull fracture. 2. Stable atrophy and chronic small vessel ischemia. Electronically Signed   By: Keith Rake M.D.   On: 05/11/2020 20:10   CT Angio Chest PE W and/or Wo Contrast  Result Date: 05/11/2020 CLINICAL DATA:  Abnormal chest radiograph, fever, abdominal pain, left lower quadrant pain EXAM: CT ANGIOGRAPHY CHEST CT ABDOMEN AND PELVIS WITH CONTRAST TECHNIQUE: Multidetector CT imaging of the chest was performed using the standard protocol during bolus administration of intravenous contrast. Multiplanar CT image reconstructions and MIPs were obtained to evaluate the vascular anatomy. Multidetector CT imaging of the abdomen and pelvis was performed using the standard protocol during bolus administration of intravenous contrast. CONTRAST:  172mL OMNIPAQUE IOHEXOL 350 MG/ML SOLN COMPARISON:  Chest radiograph 05/11/2020, CT abdomen pelvis 04/07/2018 thyroid ultrasound 01/16/2019, MR abdomen 10/08/2011 FINDINGS: CTA CHEST FINDINGS Cardiovascular:  Satisfactory opacification the pulmonary arteries to the segmental level. No pulmonary artery filling defects are identified. Some mild dilatation of the left and right main pulmonary arteries, can reflect a  degree of pulmonary artery hypertension. Cardiac size is upper limits normal. No pericardial effusion. Few scattered coronary artery calcifications. The aortic root is suboptimally assessed given cardiac pulsation artifact. Atherosclerotic plaque within the normal caliber aorta. No acute luminal abnormality of the imaged aorta. No periaortic stranding or hemorrhage. Shared origin of the brachiocephalic and left common carotid arteries. Proximal great vessels are otherwise unremarkable. Mediastinum/Nodes: No mediastinal fluid or gas. Few subcentimeter thyroid nodules in the right lobe of the gland have previously been seen comparison ultrasound and warrant no further evaluation. No acute abnormality of the trachea or thoracic esophagus. Few scattered calcified mediastinal and hilar nodes are present, may reflect sequela of prior granulomatous disease. No worrisome enlarged mediastinal, hilar or axillary adenopathy. Lungs/Pleura: There is masslike consolidative opacity situated in the posterior segment of the right upper lobe with some surrounding areas of ground-glass attenuation and extension across the minor fissure into the medial segment right middle lobe as well (4/67). While several of the airways truncated in this region there is no significant narrowing of vessels or proximal hilar airways. Some additional patchy opacity and ground-glass is present in the superior segment right lower lobe separate from this larger focal consolidation (4/71). Separate 4 mm nodule present in the left lung apex 4/23). Some mild pleural thickening is seen along both the major and minor fissures of the right lung. Mild interlobular septal thickening is present as well. A small right pleural effusion is present as well with  dependent atelectasis. Musculoskeletal: Cervical spondylitic changes including some possible degenerative bony fusion C6-7. Additional mild-to-moderate spondylitic changes in the thoracic spine. No acute or worrisome osseous lesion. 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 Review of the MIP images confirms the above findings. CT ABDOMEN and PELVIS FINDINGS Hepatobiliary: No worrisome focal liver lesions. Smooth liver surface contour. Normal hepatic attenuation. Prominent fold at the gallbladder body. Gallbladder distention is within physiologic normal. No visible calcified gallstones. No pericholecystic inflammation. No biliary ductal dilatation. Pancreas: Moderate pancreatic atrophy. Otherwise unremarkable. No pancreatic ductal dilatation or surrounding inflammatory changes. Spleen: Normal in size. No concerning splenic lesions. Adrenals/Urinary Tract: Normal adrenal glands. Kidneys enhance and excrete symmetrically. Stable bandlike region of scarring in the interpolar left kidney. No concerning renal mass. Bilateral extrarenal pelves are similar to comparison. No frank hydronephrosis or urolithiasis. Urinary bladder is unremarkable. Stomach/Bowel: Small sliding-type hiatal hernia. Distal stomach and duodenum are unremarkable. No small bowel thickening or dilatation. A normal appendix is visualized. Moderate colonic stool burden. No colonic dilatation or wall thickening. No evidence of bowel obstruction. Vascular/Lymphatic: Atherosclerotic calcifications within the abdominal aorta and branch vessels. No aneurysm or ectasia. No enlarged abdominopelvic lymph nodes. Reproductive: Uterus is surgically absent. No concerning adnexal lesions. Other: No abdominopelvic free fluid or free gas. No bowel containing hernias. Musculoskeletal: Stepwise retrolisthesis L1-L4. No associated spondylolysis. Prior L4-5 posterior decompression, fusion and  interbody spacer placement. Some mild degenerative changes seen at the SI joints, left greater than right, and bilateral hips. No acute or worrisome osseous lesions. Review of the MIP images confirms the above findings. IMPRESSION: 1. No evidence of acute pulmonary embolism. 2. Masslike opacity predominantly in the posterior segment of the right upper lobe with some surrounding areas of ground-glass attenuation and extension across the minor fissure into the medial segment right middle lobe and superior segment right lower lobe. Given air bronchograms, additional patchy opacity and ground-glass in the right lower lobe and the setting of fever, appearance is most concerning  for pneumonia though an underlying malignancy cannot be excluded. Recommend follow-up to resolution. 3. Suspect some mild interstitial edematous features as well with interlobular septal thickening and vascular redistribution. 4. Small right pleural effusion. 5. No acute findings in the abdomen or pelvis. 6. Moderate colonic stool burden. Correlate for features of constipation. 7. 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 Consider referral for outpatient breast imaging if not performed recently. 8. Aortic Atherosclerosis (ICD10-I70.0). 9. Coronary artery calcifications are present. Please note that the presence of coronary artery calcium documents the presence of coronary artery disease, the severity of this disease and any potential stenosis cannot be assessed on this non-gated CT examination. Electronically Signed   By: Lovena Le M.D.   On: 05/11/2020 20:28   CT ABDOMEN PELVIS W CONTRAST  Result Date: 05/11/2020 CLINICAL DATA:  Abnormal chest radiograph, fever, abdominal pain, left lower quadrant pain EXAM: CT ANGIOGRAPHY CHEST CT ABDOMEN AND PELVIS WITH CONTRAST TECHNIQUE: Multidetector CT imaging of the chest was performed using the standard protocol during bolus  administration of intravenous contrast. Multiplanar CT image reconstructions and MIPs were obtained to evaluate the vascular anatomy. Multidetector CT imaging of the abdomen and pelvis was performed using the standard protocol during bolus administration of intravenous contrast. CONTRAST:  121mL OMNIPAQUE IOHEXOL 350 MG/ML SOLN COMPARISON:  Chest radiograph 05/11/2020, CT abdomen pelvis 04/07/2018 thyroid ultrasound 01/16/2019, MR abdomen 10/08/2011 FINDINGS: CTA CHEST FINDINGS Cardiovascular: Satisfactory opacification the pulmonary arteries to the segmental level. No pulmonary artery filling defects are identified. Some mild dilatation of the left and right main pulmonary arteries, can reflect a degree of pulmonary artery hypertension. Cardiac size is upper limits normal. No pericardial effusion. Few scattered coronary artery calcifications. The aortic root is suboptimally assessed given cardiac pulsation artifact. Atherosclerotic plaque within the normal caliber aorta. No acute luminal abnormality of the imaged aorta. No periaortic stranding or hemorrhage. Shared origin of the brachiocephalic and left common carotid arteries. Proximal great vessels are otherwise unremarkable. Mediastinum/Nodes: No mediastinal fluid or gas. Few subcentimeter thyroid nodules in the right lobe of the gland have previously been seen comparison ultrasound and warrant no further evaluation. No acute abnormality of the trachea or thoracic esophagus. Few scattered calcified mediastinal and hilar nodes are present, may reflect sequela of prior granulomatous disease. No worrisome enlarged mediastinal, hilar or axillary adenopathy. Lungs/Pleura: There is masslike consolidative opacity situated in the posterior segment of the right upper lobe with some surrounding areas of ground-glass attenuation and extension across the minor fissure into the medial segment right middle lobe as well (4/67). While several of the airways truncated in this  region there is no significant narrowing of vessels or proximal hilar airways. Some additional patchy opacity and ground-glass is present in the superior segment right lower lobe separate from this larger focal consolidation (4/71). Separate 4 mm nodule present in the left lung apex 4/23). Some mild pleural thickening is seen along both the major and minor fissures of the right lung. Mild interlobular septal thickening is present as well. A small right pleural effusion is present as well with dependent atelectasis. Musculoskeletal: Cervical spondylitic changes including some possible degenerative bony fusion C6-7. Additional mild-to-moderate spondylitic changes in the thoracic spine. No acute or worrisome osseous lesion. 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 Review of the MIP images confirms the above findings. CT ABDOMEN and PELVIS FINDINGS Hepatobiliary: No worrisome focal liver lesions. Smooth liver surface contour. Normal hepatic attenuation.  Prominent fold at the gallbladder body. Gallbladder distention is within physiologic normal. No visible calcified gallstones. No pericholecystic inflammation. No biliary ductal dilatation. Pancreas: Moderate pancreatic atrophy. Otherwise unremarkable. No pancreatic ductal dilatation or surrounding inflammatory changes. Spleen: Normal in size. No concerning splenic lesions. Adrenals/Urinary Tract: Normal adrenal glands. Kidneys enhance and excrete symmetrically. Stable bandlike region of scarring in the interpolar left kidney. No concerning renal mass. Bilateral extrarenal pelves are similar to comparison. No frank hydronephrosis or urolithiasis. Urinary bladder is unremarkable. Stomach/Bowel: Small sliding-type hiatal hernia. Distal stomach and duodenum are unremarkable. No small bowel thickening or dilatation. A normal appendix is visualized. Moderate colonic stool burden. No colonic dilatation  or wall thickening. No evidence of bowel obstruction. Vascular/Lymphatic: Atherosclerotic calcifications within the abdominal aorta and branch vessels. No aneurysm or ectasia. No enlarged abdominopelvic lymph nodes. Reproductive: Uterus is surgically absent. No concerning adnexal lesions. Other: No abdominopelvic free fluid or free gas. No bowel containing hernias. Musculoskeletal: Stepwise retrolisthesis L1-L4. No associated spondylolysis. Prior L4-5 posterior decompression, fusion and interbody spacer placement. Some mild degenerative changes seen at the SI joints, left greater than right, and bilateral hips. No acute or worrisome osseous lesions. Review of the MIP images confirms the above findings. IMPRESSION: 1. No evidence of acute pulmonary embolism. 2. Masslike opacity predominantly in the posterior segment of the right upper lobe with some surrounding areas of ground-glass attenuation and extension across the minor fissure into the medial segment right middle lobe and superior segment right lower lobe. Given air bronchograms, additional patchy opacity and ground-glass in the right lower lobe and the setting of fever, appearance is most concerning for pneumonia though an underlying malignancy cannot be excluded. Recommend follow-up to resolution. 3. Suspect some mild interstitial edematous features as well with interlobular septal thickening and vascular redistribution. 4. Small right pleural effusion. 5. No acute findings in the abdomen or pelvis. 6. Moderate colonic stool burden. Correlate for features of constipation. 7. 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 Consider referral for outpatient breast imaging if not performed recently. 8. Aortic Atherosclerosis (ICD10-I70.0). 9. Coronary artery calcifications are present. Please note that the presence of coronary artery calcium documents the presence of coronary artery disease, the  severity of this disease and any potential stenosis cannot be assessed on this non-gated CT examination. Electronically Signed   By: Lovena Le M.D.   On: 05/11/2020 20:28   DG Chest Portable 1 View  Result Date: 05/11/2020 CLINICAL DATA:  Weakness. EXAM: PORTABLE CHEST 1 VIEW COMPARISON:  January 14, 2017. FINDINGS: The heart size and mediastinal contours are within normal limits. No pneumothorax or pleural effusion is noted. Left lung is clear. Mild right infrahilar opacity is noted which may represent pneumonia, but neoplasm cannot be excluded. The visualized skeletal structures are unremarkable. IMPRESSION: Mild right infrahilar opacity is noted which may represent pneumonia, but neoplasm cannot be excluded. CT scan of the chest with intravenous contrast is recommended for further evaluation. Electronically Signed   By: Marijo Conception M.D.   On: 05/11/2020 13:10   DG Foot Complete Right  Result Date: 05/11/2020 CLINICAL DATA:  Status post fall. EXAM: RIGHT FOOT COMPLETE - 3+ VIEW COMPARISON:  None. FINDINGS: A very small area of cortical irregularity is seen along the plantar aspect of the right cuboid bone. Adjacent 3 mm curvilinear cortical density is noted. Mild degenerative changes seen along the metatarsophalangeal articulation of the right great toe. An associated hallux valgus deformity is present. Mild  dorsal soft tissue swelling is seen along the mid right foot. IMPRESSION: Very small fracture of indeterminate age along the plantar aspect of the right cuboid bone. Correlation with physical examination of this region is recommended to determine the presence or absence of point tenderness. Electronically Signed   By: Virgina Norfolk M.D.   On: 05/11/2020 18:37    EKG: Independently reviewed. Normal sinus rhythm without acute ischemic changes. PACs no longer present when compared to prior.  Assessment/Plan Principal Problem:   Acute respiratory failure with hypoxia (HCC) Active Problems:    HTN (hypertension), benign   Hypothyroidism   Right upper lobe pneumonia   Hyponatremia   GERD (gastroesophageal reflux disease)   Anxiety with depression  [name] is a 79 y.o. female with medical history significant for HTN, GERD, hypothyroidism, depression/anxiety, and chronic back pain who is admitted with acute respiratory failure with hypoxia due to right upper lobe pneumonia.  Acute respiratory failure with hypoxia due to right upper lobe community-acquired pneumonia: Seen on CT imaging.  Patient hypoxic to 86% on room air while in the ED, requiring 2 L O2 via [place] on admission.  SARS-CoV-2 and influenza A/B PCR's were negative. -Continue IV ceftriaxone/azithromycin -Follow blood cultures, obtain sputum cultures -Check strep pneumonia and Legionella urinary antigens -Continue supplemental oxygen and wean as able  Hyponatremia: Mild with sodium 130 on admission, likely hypovolemic.  Continue IV fluid hydration and repeat labs in a.m.  Hypertension: BP soft on admission.  Holding home antihypertensives for now.  Hypothyroidism: Continue Synthroid.  Check TSH.  Anxiety/fibromyalgia: Continue home Cymbalta, Elavil, Wellbutrin.  Continue Xanax with hold parameters.  Chronic back pain: Continue home tramadol with hold parameters.  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  She states that this is a known issue.  Small fracture of right cuboid bone: Patient denies any known trauma to the area.  She has not had any pain and has been bearing weight without issue.   DVT prophylaxis: Lovenox Code Status: DNR, confirmed with patient Family Communication: Discussed with patient, she has discussed with family Disposition Plan: From home and likely discharge to home pending symptomatic improvement, ability to wean off oxygen, and ability to transition to oral  antibiotics. Consults called: None Admission status:  Status is: Inpatient  Remains inpatient appropriate because:IV treatments appropriate due to intensity of illness or inability to take PO and Inpatient level of care appropriate due to severity of illness   Dispo: The patient is from: Home              Anticipated d/c is to: Home              Anticipated d/c date is: 3 days              Patient currently is not medically stable to d/c.  Zada Finders MD [facility]  If 7PM-7AM, please contact night-coverage [email or web address]  05/11/2020, 10:20 PM

## 2020-05-11 NOTE — ED Notes (Signed)
Placed on 2L [place]  

## 2020-05-11 NOTE — ED Provider Notes (Signed)
[facility] EMERGENCY DEPARTMENT Provider Note   CSN: [id number] Arrival date & time: 05/11/20  1143     History No chief complaint on file.   [name] is a 79 y.o. female.  HPI   79 year old female with a history of renal cancer, esophageal stricture, fibromyalgia, GERD, H. pylori, hypertension, intention tremors, pneumonia, who presents the emergency department today for evaluation of generalized weakness, malaise and chills.  Patient and her daughter at bedside provide the history.  Patient states that for the last 3 days she has had generalized fatigue, chills and subjective fevers at home.  She is had a bit of a sore throat, some shortness of breath and some intermittent lower abdominal pain with diarrhea.  She denies any vomiting.  She denies any chest pain, cough.  She has had no nasal congestion.  Due to her generalized weakness she is also had multiple falls at home.  She did sustain some head trauma but did not lose consciousness.  She does have a mild headache.  She also is complaining of pain to her right foot.  She also complains of pain to her buttocks but otherwise denies any injuries.  Past Medical History:  Diagnosis Date  . Cancer ([place]) 2014   kidney  . Esophageal stricture   . Fibromyalgia   . GERD (gastroesophageal reflux disease)   . Helicobacter pylori gastritis   . Hypertension   . Intention tremor   . Lumbago   . Osteoarthritis of spine   . Pneumonia    hx of years ago   . PUD (peptic ulcer disease)   . PVD (peripheral vascular disease) [facility])     Patient Active Problem List   Diagnosis Date Noted  . Anxiety 11/14/2016  . Swelling 06/18/2016  . Hypothyroidism 05/17/2016  . Abnormal CT scan 12/28/2015  . Chronic low back pain 04/13/2015  . Cervical disc disorder with radiculopathy of cervical region 06/20/2014  . Bursitis of right shoulder 05/04/2014  . Insomnia 04/21/2013  . Routine health maintenance 01/28/2013  . HTN  (hypertension), benign 01/09/2012  . Memory loss or impairment 12/02/2010  . Fibromyalgia 05/04/2007    Past Surgical History:  Procedure Laterality Date  . ABDOMINAL HYSTERECTOMY  1985  . BREAST ENHANCEMENT SURGERY  1974  . CATARACT EXTRACTION Bilateral 2014   shapiro  . LAPAROSCOPIC PARTIAL NEPHRECTOMY Left April '13   [facility], Dr. Alinda Money  . LUMBAR FUSION  03/2003   L4-5  . TONSILLECTOMY  1958  . TUBAL LIGATION  1984     OB History   No obstetric history on file.     Family History  Problem Relation Age of Onset  . Heart disease Mother   . COPD Mother   . Thyroid disease Mother   . Diabetes Father   . Heart disease Father        CABG  . Stroke Father   . Parkinson's disease Father   . Heart disease Brother   . Stroke Brother   . Hyperlipidemia Brother   . Diabetes Daughter   . Hearing loss Brother   . Crohn's disease Other        neice  . Hypertension Son   . Diabetes Son   . Melanoma Son   . Colon cancer Neg Hx   . Stomach cancer Neg Hx   . Esophageal cancer Neg Hx     Social History   Tobacco Use  . Smoking status: Never Smoker  . Smokeless tobacco:  Never Used  Substance Use Topics  . Alcohol use: Yes    Alcohol/week: 0.0 standard drinks    Comment: occasional glass of wine with dinner   . Drug use: No    Home Medications Prior to Admission medications   Medication Sig Start Date End Date Taking? Authorizing Provider  acetaminophen (TYLENOL) 325 MG tablet Take 650 mg by mouth every 6 (six) hours as needed for headache.   Yes [provider]  ALPRAZolam (XANAX) 0.5 MG tablet Take 1 tablet (0.5 mg total) by mouth 2 (two) times daily as needed. for anxiety Patient taking differently: Take 1 mg by mouth at bedtime.  11/14/16  Yes Plotnikov, Evie Lacks, MD  amitriptyline (ELAVIL) 25 MG tablet TAKE 1 TABLET BY MOUTH EVERY DAY AT BEDTIME Patient taking differently: Take 25 mg by mouth at bedtime.  10/23/17  Yes Hoyt Koch, MD  amLODipine  (NORVASC) 5 MG tablet Take 1 tablet (5 mg total) by mouth daily. 11/07/16  Yes Lauree Chandler, NP  Artificial Tear Ointment (DRY EYES OP) Apply 1 drop to eye 3 (three) times daily as needed (for dry eyes).   Yes [provider]  buPROPion (WELLBUTRIN) 75 MG tablet Take 75 mg by mouth every morning. 02/28/20  Yes [provider]  calcium-vitamin D (OSCAL WITH D) 500-200 MG-UNIT per tablet Take 1 tablet by mouth daily.   Yes [provider]  cholecalciferol (VITAMIN D) 1000 UNITS tablet Take 1,000 Units by mouth daily.   Yes [provider]  DULoxetine (CYMBALTA) 30 MG capsule Take 30 mg by mouth See admin instructions. Take 1 capsule (30 mg) combine with 1 capsule (60 mg) by mouth daily   Yes [provider]  DULoxetine (CYMBALTA) 60 MG capsule Take 60 mg by mouth See admin instructions. Take 1 capsule (60 mg) combine with 1 capsule (30 mg) by mouth daily 04/14/18  Yes [provider]  furosemide (LASIX) 20 MG tablet Take 1 tablet (20 mg total) by mouth daily. NEED ANNUAL VISIT FOR FURTHER REFILLS Patient taking differently: Take 20 mg by mouth daily.  04/29/17  Yes Hoyt Koch, MD  levothyroxine (SYNTHROID, LEVOTHROID) 75 MCG tablet Take 75 mcg by mouth daily.  02/06/18  Yes [provider]  losartan (COZAAR) 100 MG tablet Take 1 tablet (100 mg total) by mouth daily. NEED ANNUAL VISIT FOR FURTHER REFILLS 04/29/17 05/11/20 Yes Hoyt Koch, MD  melatonin 3 MG TABS tablet Take 3 mg by mouth at bedtime.   Yes [provider]  meloxicam (MOBIC) 7.5 MG tablet Take 7.5 mg by mouth daily.  03/25/18  Yes [provider]  metoprolol succinate (TOPROL-XL) 50 MG 24 hr tablet Take 1 tablet (50 mg total) by mouth daily. Take with or immediately following a meal. NEED ANNUAL VISIT FOR FURTHER REFILLS 04/29/17  Yes Hoyt Koch, MD  Multiple Vitamins-Minerals (MULTIVITAMIN WITH MINERALS) tablet Take 1 tablet by  mouth daily.   Yes [provider]  omeprazole (PRILOSEC) 40 MG capsule Take 1 capsule (40 mg total) by mouth daily. NEED ANNUAL VISIT FOR FURTHER REFILLS Patient taking differently: Take 40 mg by mouth daily.  04/29/17  Yes Hoyt Koch, MD  polyethylene glycol powder (GLYCOLAX/MIRALAX) powder DISSOLVE 17 GRAMS INTO LIQUID AND DRINK BY MOUTH EVERY DAY Patient taking differently: Take 1 Container by mouth daily.  09/09/16  Yes Hoyt Koch, MD  RESTASIS 0.05 % ophthalmic emulsion Place 1 drop into both eyes 2 (two) times daily.  03/07/20  Yes [provider]  traMADol (ULTRAM) 50 MG tablet TAKE 1 TABLET BY MOUTH EVERY 8 HOURS AS NEEDED FOR MODERATE TO SEVERE PAIN. MAY TAKE 1 ADDITIONAL TABLET DAILY AS NEEDED FOR SEVERE PAIN Patient taking differently: Take 50 mg by mouth 2 (two) times daily.  10/24/16  Yes Hoyt Koch, MD  linaclotide Rolan Lipa) 290 MCG CAPS capsule Take 1 capsule (290 mcg total) by mouth daily before breakfast. Patient not taking: Reported on 05/11/2020 05/01/18   Irene Shipper, MD    Allergies    Morphine and related  Review of Systems   Review of Systems  Constitutional: Positive for chills, diaphoresis and fever.  HENT: Positive for sore throat. Negative for ear pain.   Eyes: Negative for pain and visual disturbance.  Respiratory: Positive for shortness of breath. Negative for cough.   Cardiovascular: Negative for chest pain and palpitations.  Gastrointestinal: Positive for abdominal pain and diarrhea. Negative for constipation, nausea and vomiting.  Genitourinary: Negative for dysuria and hematuria.  Musculoskeletal: Negative for back pain.       Right foot pain  Skin: Negative for rash.  Neurological: Positive for weakness (generalized) and headaches. Negative for seizures and syncope.  All other systems reviewed and are negative.   Physical Exam Updated Vital Signs BP (!) 117/48   Pulse 65   Temp 99.3 F (37.4 C) (Oral)    Resp 17   Ht 5\' 2"  (1.575 m)   Wt 62.1 kg   SpO2 96%   BMI 25.06 kg/m   Physical Exam Vitals and nursing note reviewed.  Constitutional:      General: She is not in acute distress.    Appearance: She is well-developed.  HENT:     Head: Normocephalic and atraumatic.  Eyes:     Extraocular Movements: Extraocular movements intact.     Conjunctiva/sclera: Conjunctivae normal.     Pupils: Pupils are equal, round, and reactive to light.  Cardiovascular:     Rate and Rhythm: Normal rate and regular rhythm.     Heart sounds: Normal heart sounds. No murmur heard.   Pulmonary:     Effort: Pulmonary effort is normal. No respiratory distress.     Breath sounds: Normal breath sounds. No wheezing, rhonchi or rales.  Abdominal:     General: Bowel sounds are normal.     Palpations: Abdomen is soft.     Tenderness: There is abdominal tenderness (mild luq and llq). There is no guarding or rebound.  Musculoskeletal:     Cervical back: Neck supple.     Right lower leg: No edema.     Left lower leg: No edema.     Comments: No TTP to the CTL spine. TTP noted to the right foot with ecchymosis to the 3rd and 4th toes and lateral foot. No ttp throughout the ankle. DP pulses 2+  Skin:    General: Skin is warm and dry.  Neurological:     Mental Status: She is alert.     Comments: Mental Status:  Alert, thought content appropriate, able to give a coherent history. Speech fluent without evidence of aphasia. Able to follow 2 step commands without difficulty.  Cranial Nerves:  II:  Peripheral visual fields grossly normal, pupils equal, round, reactive to light III,IV, VI: ptosis not present, extra-ocular motions intact bilaterally  V,VII: smile symmetric, facial light touch sensation equal VIII: hearing grossly normal to voice  X: uvula elevates symmetrically  XI: bilateral shoulder shrug symmetric and strong  XII: midline tongue extension without fassiculations Motor:  Normal tone. 5/5 strength  of BUE and BLE major muscle groups including strong and equal grip strength and dorsiflexion/plantar flexion Sensory: light touch normal in all extremities.      ED Results / Procedures / Treatments   Labs (all labs ordered are listed, but only abnormal results are displayed) Labs Reviewed  CBC - Abnormal; Notable for the following components:      Result Value   RBC 3.45 (*)    Hemoglobin 10.7 (*)    HCT 32.5 (*)    All other components within normal limits  COMPREHENSIVE METABOLIC PANEL - Abnormal; Notable for the following components:   Sodium 130 (*)    Chloride 95 (*)    Glucose, Bld 175 (*)    Creatinine, Ser 1.14 (*)    Calcium 8.8 (*)    Total Protein 6.3 (*)    Albumin 3.4 (*)    GFR calc non Af Amer 46 (*)    GFR calc Af Amer 53 (*)    All other components within normal limits  RESPIRATORY PANEL BY RT PCR (FLU A&B, COVID)  LACTIC ACID, PLASMA  URINALYSIS, ROUTINE W REFLEX MICROSCOPIC  LACTIC ACID, PLASMA  TROPONIN I (HIGH SENSITIVITY)  TROPONIN I (HIGH SENSITIVITY)    EKG None  Radiology DG Chest Portable 1 View  Result Date: 05/11/2020 CLINICAL DATA:  Weakness. EXAM: PORTABLE CHEST 1 VIEW COMPARISON:  January 14, 2017. FINDINGS: The heart size and mediastinal contours are within normal limits. No pneumothorax or pleural effusion is noted. Left lung is clear. Mild right infrahilar opacity is noted which may represent pneumonia, but neoplasm cannot be excluded. The visualized skeletal structures are unremarkable. IMPRESSION: Mild right infrahilar opacity is noted which may represent pneumonia, but neoplasm cannot be excluded. CT scan of the chest with intravenous contrast is recommended for further evaluation. Electronically Signed   By: Marijo Conception M.D.   On: 05/11/2020 13:10   DG Foot Complete Right  Result Date: 05/11/2020 CLINICAL DATA:  Status post fall. EXAM: RIGHT FOOT COMPLETE - 3+ VIEW COMPARISON:  None. FINDINGS: A very small area of cortical  irregularity is seen along the plantar aspect of the right cuboid bone. Adjacent 3 mm curvilinear cortical density is noted. Mild degenerative changes seen along the metatarsophalangeal articulation of the right great toe. An associated hallux valgus deformity is present. Mild dorsal soft tissue swelling is seen along the mid right foot. IMPRESSION: Very small fracture of indeterminate age along the plantar aspect of the right cuboid bone. Correlation with physical examination of this region is recommended to determine the presence or absence of point tenderness. Electronically Signed   By: Virgina Norfolk M.D.   On: 05/11/2020 18:37    Procedures Procedures (including critical care time)  Medications Ordered in ED Medications  sodium chloride 0.9 % bolus 1,000 mL (has no administration in time range)    ED Course  I have reviewed the triage vital signs and the nursing notes.  Pertinent labs & imaging results that were available during my care of the patient were reviewed by me and considered in my medical decision making (see chart for details).  Clinical Course as of May 12 1911  Thu May 11, 2020  1858   IMPRESSION: Very small fracture of indeterminate age along the plantar aspect of the right cuboid bone. Correlation with physical examination of this region is recommended to determine the presence or absence of point tenderness.   [  WF]    Clinical Course User Index [WF] Tedd Sias, [place]   MDM Rules/Calculators/A&P                          79 year old female presenting to emergency department today for evaluation of subjective fevers, myalgias, fatigue and malaise.  She also has some shortness of breath, diarrhea and a sore throat.  Reviewed/interpreted labs CBC is without leukocytosis, mild anemia CMP with mild hyponatremia, hypochloremia and mildly elevated creatinine.  LFTs are normal Troponin is negative Lactic acid is negative Covid testing is negative as well as  flu UA pending at the time of shift change  Chest x-ray showed - Mild right infrahilar opacity is noted which may represent pneumonia, but neoplasm cannot be excluded. CT scan of the chest with intravenous contrast is recommended for further evaluation.   Xray right foot - Very small fracture of indeterminate age along the plantar aspect of the right cuboid bone. Correlation with physical examination of this region is recommended to determine the presence or absence of point tenderness.  At shift change, care transition to [facility], PA-C pending remainder of imaging studies.  Patient will likely need admission given her generalized weakness and dehydration.  Final Clinical Impression(s) / ED Diagnoses Final diagnoses:  None    Rx / DC Orders ED Discharge Orders    None       Bishop Dublin 05/11/20 1913    Lacretia Leigh, MD 05/17/20 [phone]

## 2020-05-12 ENCOUNTER — Encounter (HOSPITAL_COMMUNITY): Payer: Self-pay | Admitting: Internal Medicine

## 2020-05-12 DIAGNOSIS — J9601 Acute respiratory failure with hypoxia: Secondary | ICD-10-CM | POA: Diagnosis not present

## 2020-05-12 DIAGNOSIS — F418 Other specified anxiety disorders: Secondary | ICD-10-CM

## 2020-05-12 DIAGNOSIS — S92214A Nondisplaced fracture of cuboid bone of right foot, initial encounter for closed fracture: Secondary | ICD-10-CM

## 2020-05-12 DIAGNOSIS — J189 Pneumonia, unspecified organism: Principal | ICD-10-CM

## 2020-05-12 DIAGNOSIS — E871 Hypo-osmolality and hyponatremia: Secondary | ICD-10-CM

## 2020-05-12 DIAGNOSIS — R413 Other amnesia: Secondary | ICD-10-CM

## 2020-05-12 DIAGNOSIS — E039 Hypothyroidism, unspecified: Secondary | ICD-10-CM

## 2020-05-12 DIAGNOSIS — I10 Essential (primary) hypertension: Secondary | ICD-10-CM

## 2020-05-12 LAB — CBC
HCT: 31.8 % — ABNORMAL LOW (ref 36.0–46.0)
Hemoglobin: 10.5 g/dL — ABNORMAL LOW (ref 12.0–15.0)
MCH: 31.8 pg (ref 26.0–34.0)
MCHC: 33 g/dL (ref 30.0–36.0)
MCV: 96.4 fL (ref 80.0–100.0)
Platelets: 227 10*3/uL (ref 150–400)
RBC: 3.3 MIL/uL — ABNORMAL LOW (ref 3.87–5.11)
RDW: 12.2 % (ref 11.5–15.5)
WBC: 8.3 10*3/uL (ref 4.0–10.5)
nRBC: 0 % (ref 0.0–0.2)

## 2020-05-12 LAB — BASIC METABOLIC PANEL
Anion gap: 8 (ref 5–15)
BUN: 9 mg/dL (ref 8–23)
CO2: 22 mmol/L (ref 22–32)
Calcium: 8.2 mg/dL — ABNORMAL LOW (ref 8.9–10.3)
Chloride: 100 mmol/L (ref 98–111)
Creatinine, Ser: 0.61 mg/dL (ref 0.44–1.00)
GFR calc Af Amer: >60 mL/min (ref >60)
GFR calc non Af Amer: >60 mL/min (ref >60)
Glucose, Bld: 102 mg/dL — ABNORMAL HIGH (ref 70–99)
Potassium: 3.9 mmol/L (ref 3.5–5.1)
Sodium: 130 mmol/L — ABNORMAL LOW (ref 135–145)

## 2020-05-12 LAB — STREP PNEUMONIAE URINARY ANTIGEN: Strep Pneumo Urinary Antigen: NEGATIVE

## 2020-05-12 LAB — TSH: TSH: 1.425 u[IU]/mL (ref 0.350–4.500)

## 2020-05-12 MED ORDER — SODIUM CHLORIDE 0.9 % IV SOLN: INTRAVENOUS | Status: DC

## 2020-05-12 MED ORDER — PANTOPRAZOLE SODIUM 40 MG PO TBEC
40.0000 mg | DELAYED_RELEASE_TABLET | Freq: Every day | ORAL | Status: DC
  Administered 2020-05-12 – 2020-05-18 (×7): 40 mg via ORAL
  Filled 2020-05-12 (×7): qty 1

## 2020-05-12 MED ORDER — CYCLOSPORINE 0.05 % OP EMUL
1.0000 [drp] | Freq: Two times a day (BID) | OPHTHALMIC | Status: DC
  Administered 2020-05-12 – 2020-05-18 (×11): 1 [drp] via OPHTHALMIC
  Filled 2020-05-12 (×12): qty 1

## 2020-05-12 NOTE — ED Notes (Addendum)
Patient assisted on and off bed pan, did not void or have bowel movement. Patient confused, reoriented to time and place. Patient in NAD. Vitals stable. Call bell in reach.

## 2020-05-12 NOTE — ED Notes (Signed)
Pt resting with eyes closed. Easily arousable. Reports headache has resolved since receiving tylenol

## 2020-05-12 NOTE — Progress Notes (Addendum)
PROGRESS NOTE    [name]   ZOX:[id number]  DOB: 1940/08/23  DOA: 05/11/2020 PCP: Reynold Bowen, MD   Brief Narrative:  [name]   is a 79 y.o. female with medical history significant for HTN, GERD, hypothyroidism, depression/anxiety, and chronic back pain who presents to the ED for evaluation of fevers, chills, and malaise.  Initial vitals showed BP 87/45, pulse 65, RR 18, temp 99.3 Fahrenheit, SPO2 97% on room air. While in the ED patient was noted to become hypoxic with SPO2 86% on room air, improved to >92% on 2 L O2 via [place]. Also tachypneic with RR up to 29. CTA chest/abdomen/pelvis showed masslike opacity in the posterior segment of the right upper lobe with surrounding groundglass attenuation and extension across the minor fissure into the medial right middle lobe and superior right lower lobe suspicious for pneumonia, malignancy cannot be excluded.   Right foot x-ray showed very small fracture of indeterminate age along the plantar aspect of the right cuboid bone.  Patient was given 1 L normal saline and started on IV ceftriaxone and azithromycin. The hospitalist service was consulted to admit for further evaluation and management.  Subjective: No complaints. Daughter at bedside.    Assessment & Plan:   Principal Problem:   Acute respiratory failure with hypoxia-  Right upper lobe pneumonia - Resp panel, Influenza and Covid neg - strep Urine antigen negative - treating as CAP with Ceftriaxone and Azithromycin- will need repeat imaging in a few weeks to ensure no cancer  Active Problems:     Hyponatremia/ dehydration/ elevated Cr - given IVF in ED - Cr improved from 114 to 0.61 but Sodium still 130- will continue slow NS overnight    Memory loss or impairment - daughter states patient has mild memory loss- no sundowning last night in hospital - follow    Hypotension with h/o HTN (hypertension), benign - hold Lasix, Norvasc, Cozaar, Metoprolol     Hypothyroidism - cont Synthroid    Anxiety with depression - cont Wellburin, Cymbalta, Xanax, Elavil  GERD - cont PPI  Cuboid fracture of indeterminate age - follow   Time spent in minutes: 35 DVT prophylaxis: enoxaparin (LOVENOX) injection 40 mg Start: 05/11/20 2315  Code Status: DNR Family Communication: daughter Disposition Plan:  Status is: Inpatient  Remains inpatient appropriate because:treating pneumonia with IV Antibiotics. Treating dehydration with IVF.    Dispo: The patient is from: Home              Anticipated d/c is to: TBD              Anticipated d/c date is: 2 days              Patient currently is not medically stable to d/c.  Consultants:   none Procedures:   none Antimicrobials:  Anti-infectives (From admission, onward)   Start     Dose/Rate Route Frequency Ordered Stop   05/12/20 2200  cefTRIAXone (ROCEPHIN) 2 g in sodium chloride 0.9 % 100 mL IVPB        2 g 200 mL/hr over 30 Minutes Intravenous Every 24 hours 05/11/20 2259 05/16/20 2159   05/12/20 2200  azithromycin (ZITHROMAX) 500 mg in sodium chloride 0.9 % 250 mL IVPB        500 mg 250 mL/hr over 60 Minutes Intravenous Every 24 hours 05/11/20 2259 05/16/20 2159   05/11/20 2200  cefTRIAXone (ROCEPHIN) 1 g in sodium chloride 0.9 % 100 mL IVPB  1 g 200 mL/hr over 30 Minutes Intravenous  Once 05/11/20 2151 05/11/20 2255   05/11/20 2200  azithromycin (ZITHROMAX) 500 mg in sodium chloride 0.9 % 250 mL IVPB        500 mg 250 mL/hr over 60 Minutes Intravenous  Once 05/11/20 2151 05/12/20 0004       Objective: Vitals:   05/12/20 1530 05/12/20 1600 05/12/20 1630 05/12/20 1645  BP: (!) 103/46 (!) 103/49 (!) 101/52 (!) 110/51  Pulse: 67 64  67  Resp: (!) 26 (!) 22 (!) 21 (!) 26  Temp:      TempSrc:      SpO2: 98% (!) 78%  96%  Weight:      Height:        Intake/Output Summary (Last 24 hours) at 05/12/2020 1813 Last data filed at 05/11/2020 2255 Gross per 24 hour  Intake 100 ml   Output --  Net 100 ml   Filed Weights   05/11/20 1804  Weight: 62.1 kg    Examination: General exam: Appears comfortable  HEENT: PERRLA, oral mucosa moist, no sclera icterus or thrush Respiratory system: Clear to auscultation. Respiratory effort normal. Cardiovascular system: S1 & S2 heard, RRR.   Gastrointestinal system: Abdomen soft, non-tender, nondistended. Normal bowel sounds. Central nervous system: Alert and oriented. No focal neurological deficits. Extremities: No cyanosis, clubbing or edema Skin: No rashes or ulcers Psychiatry:  Mood & affect appropriate.     Data Reviewed: I have personally reviewed following labs and imaging studies  CBC: Recent Labs  Lab 05/11/20 1203 05/12/20 0325  WBC 8.0 8.3  HGB 10.7* 10.5*  HCT 32.5* 31.8*  MCV 94.2 96.4  PLT 233 106   Basic Metabolic Panel: Recent Labs  Lab 05/11/20 1203 05/12/20 0325  NA 130* 130*  K 4.1 3.9  CL 95* 100  CO2 25 22  GLUCOSE 175* 102*  BUN 18 9  CREATININE 1.14* 0.61  CALCIUM 8.8* 8.2*   GFR: Estimated Creatinine Clearance: 49.4 mL/min (by C-G formula based on SCr of 0.61 mg/dL). Liver Function Tests: Recent Labs  Lab 05/11/20 1203  AST 22  ALT 15  ALKPHOS 50  BILITOT 0.8  PROT 6.3*  ALBUMIN 3.4*   No results for input(s): LIPASE, AMYLASE in the last 168 hours. No results for input(s): AMMONIA in the last 168 hours. Coagulation Profile: No results for input(s): INR, PROTIME in the last 168 hours. Cardiac Enzymes: No results for input(s): CKTOTAL, CKMB, CKMBINDEX, TROPONINI in the last 168 hours. BNP (last 3 results) No results for input(s): PROBNP in the last 8760 hours. HbA1C: No results for input(s): HGBA1C in the last 72 hours. CBG: No results for input(s): GLUCAP in the last 168 hours. Lipid Profile: No results for input(s): CHOL, HDL, LDLCALC, TRIG, CHOLHDL, LDLDIRECT in the last 72 hours. Thyroid Function Tests: Recent Labs    05/12/20 0325  TSH 1.425   Anemia  Panel: No results for input(s): VITAMINB12, FOLATE, FERRITIN, TIBC, IRON, RETICCTPCT in the last 72 hours. Urine analysis:    Component Value Date/Time   COLORURINE YELLOW 05/11/2020 1925   APPEARANCEUR HAZY (A) 05/11/2020 1925   LABSPEC 1.011 05/11/2020 1925   PHURINE 6.0 05/11/2020 1925   GLUCOSEU NEGATIVE 05/11/2020 1925   GLUCOSEU NEGATIVE 05/06/2016 1708   HGBUR NEGATIVE 05/11/2020 1925   BILIRUBINUR NEGATIVE 05/11/2020 1925   KETONESUR NEGATIVE 05/11/2020 1925   PROTEINUR NEGATIVE 05/11/2020 1925   UROBILINOGEN 0.2 05/06/2016 1708   NITRITE NEGATIVE 05/11/2020 1925   LEUKOCYTESUR TRACE (  A) 05/11/2020 1925   Sepsis Labs: @LABRCNTIP (procalcitonin:4,lacticidven:4) ) Recent Results (from the past 240 hour(s))  Respiratory Panel by RT PCR (Flu A&B, Covid) - Nasopharyngeal Swab     Status: None   Collection Time: 05/11/20 12:08 PM   Specimen: Nasopharyngeal Swab  Result Value Ref Range Status   SARS Coronavirus 2 by RT PCR NEGATIVE NEGATIVE Final    Comment: (NOTE) SARS-CoV-2 target nucleic acids are NOT DETECTED.  The SARS-CoV-2 RNA is generally detectable in upper respiratoy specimens during the acute phase of infection. The lowest concentration of SARS-CoV-2 viral copies this assay can detect is 131 copies/mL. A negative result does not preclude SARS-Cov-2 infection and should not be used as the sole basis for treatment or other patient management decisions. A negative result may occur with  improper specimen collection/handling, submission of specimen other than nasopharyngeal swab, presence of viral mutation(s) within the areas targeted by this assay, and inadequate number of viral copies (<131 copies/mL). A negative result must be combined with clinical observations, patient history, and epidemiological information. The expected result is Negative.  Fact Sheet for Patients:  PinkCheek.be  Fact Sheet for Healthcare Providers:   GravelBags.it  This test is no t yet approved or cleared by the [place] FDA and  has been authorized for detection and/or diagnosis of SARS-CoV-2 by FDA under an Emergency Use Authorization (EUA). This EUA will remain  in effect (meaning this test can be used) for the duration of the COVID-19 declaration under Section 564(b)(1) of the Act, 21 U.S.C. section 360bbb-3(b)(1), unless the authorization is terminated or revoked sooner.     Influenza A by PCR NEGATIVE NEGATIVE Final   Influenza B by PCR NEGATIVE NEGATIVE Final    Comment: (NOTE) The Xpert Xpress SARS-CoV-2/FLU/RSV assay is intended as an aid in  the diagnosis of influenza from Nasopharyngeal swab specimens and  should not be used as a sole basis for treatment. Nasal washings and  aspirates are unacceptable for Xpert Xpress SARS-CoV-2/FLU/RSV  testing.  Fact Sheet for Patients: PinkCheek.be  Fact Sheet for Healthcare Providers: GravelBags.it  This test is not yet approved or cleared by the [place] FDA and  has been authorized for detection and/or diagnosis of SARS-CoV-2 by  FDA under an Emergency Use Authorization (EUA). This EUA will remain  in effect (meaning this test can be used) for the duration of the  Covid-19 declaration under Section 564(b)(1) of the Act, 21  U.S.C. section 360bbb-3(b)(1), unless the authorization is  terminated or revoked. Performed at [facility], [place] [address] [place], [place] [address]   Blood culture (routine x 2)     Status: None (Preliminary result)   Collection Time: 05/11/20 10:00 PM   Specimen: BLOOD RIGHT FOREARM  Result Value Ref Range Status   Specimen Description BLOOD RIGHT FOREARM  Final   Special Requests   Final    BOTTLES DRAWN AEROBIC AND ANAEROBIC Blood Culture results may not be optimal due to an inadequate volume of blood received in culture bottles   Culture    Final    NO GROWTH < 24 HOURS Performed at [facility], [place] [address] [place], [place] [address]    Report Status PENDING  Incomplete  Blood culture (routine x 2)     Status: None (Preliminary result)   Collection Time: 05/11/20 10:10 PM   Specimen: BLOOD  Result Value Ref Range Status   Specimen Description BLOOD RIGHT ANTECUBITAL  Final   Special Requests  Final    BOTTLES DRAWN AEROBIC AND ANAEROBIC Blood Culture adequate volume   Culture   Final    NO GROWTH < 24 HOURS Performed at [facility], [place] [address] [place], [place] [address]    Report Status PENDING  Incomplete         Radiology Studies: CT Head Wo Contrast  Result Date: 05/11/2020 CLINICAL DATA:  Head trauma.  Multiple falls.  Head injury. EXAM: CT HEAD WITHOUT CONTRAST TECHNIQUE: Contiguous axial images were obtained from the base of the skull through the vertex without intravenous contrast. COMPARISON:  04/24/2020 FINDINGS: Brain: Stable degree of atrophy and chronic small vessel ischemia. No intracranial hemorrhage, mass effect, or midline shift. No hydrocephalus. The basilar cisterns are patent. No evidence of territorial infarct or acute ischemia. No extra-axial or intracranial fluid collection. Vascular: No hyperdense vessel. Air in the right cavernous sinus is typically related to IV catheter placement. Skull: No acute fracture. Stable small incidental bilateral skull exostosis. Sinuses/Orbits: Paranasal sinuses and mastoid air cells are clear. The visualized orbits are unremarkable. Bilateral lens resection. Other: None. IMPRESSION: 1. No acute intracranial abnormality. No skull fracture. 2. Stable atrophy and chronic small vessel ischemia. Electronically Signed   By: Keith Rake M.D.   On: 05/11/2020 20:10   CT Angio Chest PE W and/or Wo Contrast  Result Date: 05/11/2020 CLINICAL DATA:  Abnormal chest radiograph, fever, abdominal pain, left lower quadrant pain EXAM: CT ANGIOGRAPHY CHEST CT  ABDOMEN AND PELVIS WITH CONTRAST TECHNIQUE: Multidetector CT imaging of the chest was performed using the standard protocol during bolus administration of intravenous contrast. Multiplanar CT image reconstructions and MIPs were obtained to evaluate the vascular anatomy. Multidetector CT imaging of the abdomen and pelvis was performed using the standard protocol during bolus administration of intravenous contrast. CONTRAST:  155mL OMNIPAQUE IOHEXOL 350 MG/ML SOLN COMPARISON:  Chest radiograph 05/11/2020, CT abdomen pelvis 04/07/2018 thyroid ultrasound 01/16/2019, MR abdomen 10/08/2011 FINDINGS: CTA CHEST FINDINGS Cardiovascular: Satisfactory opacification the pulmonary arteries to the segmental level. No pulmonary artery filling defects are identified. Some mild dilatation of the left and right main pulmonary arteries, can reflect a degree of pulmonary artery hypertension. Cardiac size is upper limits normal. No pericardial effusion. Few scattered coronary artery calcifications. The aortic root is suboptimally assessed given cardiac pulsation artifact. Atherosclerotic plaque within the normal caliber aorta. No acute luminal abnormality of the imaged aorta. No periaortic stranding or hemorrhage. Shared origin of the brachiocephalic and left common carotid arteries. Proximal great vessels are otherwise unremarkable. Mediastinum/Nodes: No mediastinal fluid or gas. Few subcentimeter thyroid nodules in the right lobe of the gland have previously been seen comparison ultrasound and warrant no further evaluation. No acute abnormality of the trachea or thoracic esophagus. Few scattered calcified mediastinal and hilar nodes are present, may reflect sequela of prior granulomatous disease. No worrisome enlarged mediastinal, hilar or axillary adenopathy. Lungs/Pleura: There is masslike consolidative opacity situated in the posterior segment of the right upper lobe with some surrounding areas of ground-glass attenuation and  extension across the minor fissure into the medial segment right middle lobe as well (4/67). While several of the airways truncated in this region there is no significant narrowing of vessels or proximal hilar airways. Some additional patchy opacity and ground-glass is present in the superior segment right lower lobe separate from this larger focal consolidation (4/71). Separate 4 mm nodule present in the left lung apex 4/23). Some mild pleural thickening is seen along both the major and minor fissures  of the right lung. Mild interlobular septal thickening is present as well. A small right pleural effusion is present as well with dependent atelectasis. Musculoskeletal: Cervical spondylitic changes including some possible degenerative bony fusion C6-7. Additional mild-to-moderate spondylitic changes in the thoracic spine. No acute or worrisome osseous lesion. 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 Review of the MIP images confirms the above findings. CT ABDOMEN and PELVIS FINDINGS Hepatobiliary: No worrisome focal liver lesions. Smooth liver surface contour. Normal hepatic attenuation. Prominent fold at the gallbladder body. Gallbladder distention is within physiologic normal. No visible calcified gallstones. No pericholecystic inflammation. No biliary ductal dilatation. Pancreas: Moderate pancreatic atrophy. Otherwise unremarkable. No pancreatic ductal dilatation or surrounding inflammatory changes. Spleen: Normal in size. No concerning splenic lesions. Adrenals/Urinary Tract: Normal adrenal glands. Kidneys enhance and excrete symmetrically. Stable bandlike region of scarring in the interpolar left kidney. No concerning renal mass. Bilateral extrarenal pelves are similar to comparison. No frank hydronephrosis or urolithiasis. Urinary bladder is unremarkable. Stomach/Bowel: Small sliding-type hiatal hernia. Distal stomach and duodenum are  unremarkable. No small bowel thickening or dilatation. A normal appendix is visualized. Moderate colonic stool burden. No colonic dilatation or wall thickening. No evidence of bowel obstruction. Vascular/Lymphatic: Atherosclerotic calcifications within the abdominal aorta and branch vessels. No aneurysm or ectasia. No enlarged abdominopelvic lymph nodes. Reproductive: Uterus is surgically absent. No concerning adnexal lesions. Other: No abdominopelvic free fluid or free gas. No bowel containing hernias. Musculoskeletal: Stepwise retrolisthesis L1-L4. No associated spondylolysis. Prior L4-5 posterior decompression, fusion and interbody spacer placement. Some mild degenerative changes seen at the SI joints, left greater than right, and bilateral hips. No acute or worrisome osseous lesions. Review of the MIP images confirms the above findings. IMPRESSION: 1. No evidence of acute pulmonary embolism. 2. Masslike opacity predominantly in the posterior segment of the right upper lobe with some surrounding areas of ground-glass attenuation and extension across the minor fissure into the medial segment right middle lobe and superior segment right lower lobe. Given air bronchograms, additional patchy opacity and ground-glass in the right lower lobe and the setting of fever, appearance is most concerning for pneumonia though an underlying malignancy cannot be excluded. Recommend follow-up to resolution. 3. Suspect some mild interstitial edematous features as well with interlobular septal thickening and vascular redistribution. 4. Small right pleural effusion. 5. No acute findings in the abdomen or pelvis. 6. Moderate colonic stool burden. Correlate for features of constipation. 7. 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 Consider referral for outpatient breast imaging if not performed recently. 8. Aortic Atherosclerosis (ICD10-I70.0). 9. Coronary  artery calcifications are present. Please note that the presence of coronary artery calcium documents the presence of coronary artery disease, the severity of this disease and any potential stenosis cannot be assessed on this non-gated CT examination. Electronically Signed   By: Lovena Le M.D.   On: 05/11/2020 20:28   CT ABDOMEN PELVIS W CONTRAST  Result Date: 05/11/2020 CLINICAL DATA:  Abnormal chest radiograph, fever, abdominal pain, left lower quadrant pain EXAM: CT ANGIOGRAPHY CHEST CT ABDOMEN AND PELVIS WITH CONTRAST TECHNIQUE: Multidetector CT imaging of the chest was performed using the standard protocol during bolus administration of intravenous contrast. Multiplanar CT image reconstructions and MIPs were obtained to evaluate the vascular anatomy. Multidetector CT imaging of the abdomen and pelvis was performed using the standard protocol during bolus administration of intravenous contrast. CONTRAST:  156mL OMNIPAQUE IOHEXOL 350 MG/ML SOLN COMPARISON:  Chest  radiograph 05/11/2020, CT abdomen pelvis 04/07/2018 thyroid ultrasound 01/16/2019, MR abdomen 10/08/2011 FINDINGS: CTA CHEST FINDINGS Cardiovascular: Satisfactory opacification the pulmonary arteries to the segmental level. No pulmonary artery filling defects are identified. Some mild dilatation of the left and right main pulmonary arteries, can reflect a degree of pulmonary artery hypertension. Cardiac size is upper limits normal. No pericardial effusion. Few scattered coronary artery calcifications. The aortic root is suboptimally assessed given cardiac pulsation artifact. Atherosclerotic plaque within the normal caliber aorta. No acute luminal abnormality of the imaged aorta. No periaortic stranding or hemorrhage. Shared origin of the brachiocephalic and left common carotid arteries. Proximal great vessels are otherwise unremarkable. Mediastinum/Nodes: No mediastinal fluid or gas. Few subcentimeter thyroid nodules in the right lobe of the gland  have previously been seen comparison ultrasound and warrant no further evaluation. No acute abnormality of the trachea or thoracic esophagus. Few scattered calcified mediastinal and hilar nodes are present, may reflect sequela of prior granulomatous disease. No worrisome enlarged mediastinal, hilar or axillary adenopathy. Lungs/Pleura: There is masslike consolidative opacity situated in the posterior segment of the right upper lobe with some surrounding areas of ground-glass attenuation and extension across the minor fissure into the medial segment right middle lobe as well (4/67). While several of the airways truncated in this region there is no significant narrowing of vessels or proximal hilar airways. Some additional patchy opacity and ground-glass is present in the superior segment right lower lobe separate from this larger focal consolidation (4/71). Separate 4 mm nodule present in the left lung apex 4/23). Some mild pleural thickening is seen along both the major and minor fissures of the right lung. Mild interlobular septal thickening is present as well. A small right pleural effusion is present as well with dependent atelectasis. Musculoskeletal: Cervical spondylitic changes including some possible degenerative bony fusion C6-7. Additional mild-to-moderate spondylitic changes in the thoracic spine. No acute or worrisome osseous lesion. 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 Review of the MIP images confirms the above findings. CT ABDOMEN and PELVIS FINDINGS Hepatobiliary: No worrisome focal liver lesions. Smooth liver surface contour. Normal hepatic attenuation. Prominent fold at the gallbladder body. Gallbladder distention is within physiologic normal. No visible calcified gallstones. No pericholecystic inflammation. No biliary ductal dilatation. Pancreas: Moderate pancreatic atrophy. Otherwise unremarkable. No pancreatic ductal  dilatation or surrounding inflammatory changes. Spleen: Normal in size. No concerning splenic lesions. Adrenals/Urinary Tract: Normal adrenal glands. Kidneys enhance and excrete symmetrically. Stable bandlike region of scarring in the interpolar left kidney. No concerning renal mass. Bilateral extrarenal pelves are similar to comparison. No frank hydronephrosis or urolithiasis. Urinary bladder is unremarkable. Stomach/Bowel: Small sliding-type hiatal hernia. Distal stomach and duodenum are unremarkable. No small bowel thickening or dilatation. A normal appendix is visualized. Moderate colonic stool burden. No colonic dilatation or wall thickening. No evidence of bowel obstruction. Vascular/Lymphatic: Atherosclerotic calcifications within the abdominal aorta and branch vessels. No aneurysm or ectasia. No enlarged abdominopelvic lymph nodes. Reproductive: Uterus is surgically absent. No concerning adnexal lesions. Other: No abdominopelvic free fluid or free gas. No bowel containing hernias. Musculoskeletal: Stepwise retrolisthesis L1-L4. No associated spondylolysis. Prior L4-5 posterior decompression, fusion and interbody spacer placement. Some mild degenerative changes seen at the SI joints, left greater than right, and bilateral hips. No acute or worrisome osseous lesions. Review of the MIP images confirms the above findings. IMPRESSION: 1. No evidence of acute pulmonary embolism. 2. Masslike opacity predominantly in the posterior segment of the right upper lobe with  some surrounding areas of ground-glass attenuation and extension across the minor fissure into the medial segment right middle lobe and superior segment right lower lobe. Given air bronchograms, additional patchy opacity and ground-glass in the right lower lobe and the setting of fever, appearance is most concerning for pneumonia though an underlying malignancy cannot be excluded. Recommend follow-up to resolution. 3. Suspect some mild interstitial  edematous features as well with interlobular septal thickening and vascular redistribution. 4. Small right pleural effusion. 5. No acute findings in the abdomen or pelvis. 6. Moderate colonic stool burden. Correlate for features of constipation. 7. 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 Consider referral for outpatient breast imaging if not performed recently. 8. Aortic Atherosclerosis (ICD10-I70.0). 9. Coronary artery calcifications are present. Please note that the presence of coronary artery calcium documents the presence of coronary artery disease, the severity of this disease and any potential stenosis cannot be assessed on this non-gated CT examination. Electronically Signed   By: Lovena Le M.D.   On: 05/11/2020 20:28   DG Chest Portable 1 View  Result Date: 05/11/2020 CLINICAL DATA:  Weakness. EXAM: PORTABLE CHEST 1 VIEW COMPARISON:  January 14, 2017. FINDINGS: The heart size and mediastinal contours are within normal limits. No pneumothorax or pleural effusion is noted. Left lung is clear. Mild right infrahilar opacity is noted which may represent pneumonia, but neoplasm cannot be excluded. The visualized skeletal structures are unremarkable. IMPRESSION: Mild right infrahilar opacity is noted which may represent pneumonia, but neoplasm cannot be excluded. CT scan of the chest with intravenous contrast is recommended for further evaluation. Electronically Signed   By: Marijo Conception M.D.   On: 05/11/2020 13:10   DG Foot Complete Right  Result Date: 05/11/2020 CLINICAL DATA:  Status post fall. EXAM: RIGHT FOOT COMPLETE - 3+ VIEW COMPARISON:  None. FINDINGS: A very small area of cortical irregularity is seen along the plantar aspect of the right cuboid bone. Adjacent 3 mm curvilinear cortical density is noted. Mild degenerative changes seen along the metatarsophalangeal articulation of the right great toe. An associated hallux  valgus deformity is present. Mild dorsal soft tissue swelling is seen along the mid right foot. IMPRESSION: Very small fracture of indeterminate age along the plantar aspect of the right cuboid bone. Correlation with physical examination of this region is recommended to determine the presence or absence of point tenderness. Electronically Signed   By: Virgina Norfolk M.D.   On: 05/11/2020 18:37      Scheduled Meds: . ALPRAZolam  1 mg Oral QHS  . amitriptyline  25 mg Oral QHS  . buPROPion  75 mg Oral q morning - 10a  . DULoxetine  90 mg Oral QHS  . enoxaparin (LOVENOX) injection  40 mg Subcutaneous QHS  . guaiFENesin  600 mg Oral BID  . levothyroxine  75 mcg Oral Daily  . melatonin  3 mg Oral QHS   Continuous Infusions: . azithromycin    . cefTRIAXone (ROCEPHIN)  IV       LOS: 1 day      Debbe Odea, MD [facility] Pager: [email or web address] 05/12/2020, 6:13 PM

## 2020-05-12 NOTE — Plan of Care (Signed)

## 2020-05-12 NOTE — ED Notes (Signed)
bfast ordered 

## 2020-05-12 NOTE — ED Notes (Signed)
Patient resting peacefully on stretcher with HOB elevated, respirations even and nonlabored. NAD noted. Call bell in reach.

## 2020-05-13 DIAGNOSIS — J189 Pneumonia, unspecified organism: Secondary | ICD-10-CM | POA: Diagnosis not present

## 2020-05-13 DIAGNOSIS — S92214A Nondisplaced fracture of cuboid bone of right foot, initial encounter for closed fracture: Secondary | ICD-10-CM | POA: Diagnosis not present

## 2020-05-13 DIAGNOSIS — F418 Other specified anxiety disorders: Secondary | ICD-10-CM | POA: Diagnosis not present

## 2020-05-13 DIAGNOSIS — J9601 Acute respiratory failure with hypoxia: Secondary | ICD-10-CM | POA: Diagnosis not present

## 2020-05-13 LAB — BASIC METABOLIC PANEL
Anion gap: 10 (ref 5–15)
BUN: <5 mg/dL — ABNORMAL LOW (ref 8–23)
CO2: 22 mmol/L (ref 22–32)
Calcium: 8.4 mg/dL — ABNORMAL LOW (ref 8.9–10.3)
Chloride: 102 mmol/L (ref 98–111)
Creatinine, Ser: 0.58 mg/dL (ref 0.44–1.00)
GFR calc Af Amer: >60 mL/min (ref >60)
GFR calc non Af Amer: >60 mL/min (ref >60)
Glucose, Bld: 124 mg/dL — ABNORMAL HIGH (ref 70–99)
Potassium: 3.5 mmol/L (ref 3.5–5.1)
Sodium: 134 mmol/L — ABNORMAL LOW (ref 135–145)

## 2020-05-13 NOTE — Progress Notes (Signed)
Patient walked approximately 50 feet and got winded. She couldn't continue due to her O2 level dropping even at 4 liters. Patient states she would like to walk again tomorrow. Will continue to monitor patient.

## 2020-05-13 NOTE — Progress Notes (Signed)
PROGRESS NOTE    [name]   CZY:[id number]  DOB: 12/02/1940  DOA: 05/11/2020 PCP: Reynold Bowen, MD   Brief Narrative:  [name]   is a 79 y.o. female with medical history significant for HTN, GERD, hypothyroidism, depression/anxiety, and chronic back pain who presents to the ED for evaluation of fevers, chills, and malaise.  Initial vitals showed BP 87/45, pulse 65, RR 18, temp 99.3 Fahrenheit, SPO2 97% on room air. While in the ED patient was noted to become hypoxic with SPO2 86% on room air, improved to >92% on 2 L O2 via [place]. Also tachypneic with RR up to 29. CTA chest/abdomen/pelvis showed masslike opacity in the posterior segment of the right upper lobe with surrounding groundglass attenuation and extension across the minor fissure into the medial right middle lobe and superior right lower lobe suspicious for pneumonia, malignancy cannot be excluded.   Right foot x-ray showed very small fracture of indeterminate age along the plantar aspect of the right cuboid bone.  Patient was given 1 L normal saline and started on IV ceftriaxone and azithromycin. The hospitalist service was consulted to admit for further evaluation and management.  Subjective: She has severe weakness today and a very poor appetite for solid and liquids. She has a moderate cough. Daughter at bedside.    Assessment & Plan:   Principal Problem:   Acute respiratory failure with hypoxia-  Right upper lobe pneumonia - Resp panel, Influenza and Covid neg - strep Urine antigen negative - treating as CAP with Ceftriaxone and Azithromycin- will need repeat imaging in a few weeks to ensure no cancer  Active Problems:     Hyponatremia/ dehydration/ elevated Cr - given IVF in ED - Cr improved from 114 to 0.61  - Sodium still 130 > 134- - will continue slow NS until I see she is able to drink > 6 cups of liquid a day    Memory loss or impairment - daughter states patient has mild memory loss- no  sundowning thus far- follow    Hypotension with h/o HTN (hypertension), benign - holding  Lasix, Norvasc, Cozaar, Metoprolol as BP still not elevated    Hypothyroidism - cont Synthroid    Anxiety with depression - cont Wellburin, Cymbalta, Xanax, Elavil  GERD - cont PPI  Cuboid fracture of indeterminate age - she has no pain and doesn't recall falling- she has some bruising by her 2nd and third toes - have asked for an ortho opinion- Dr Percell Miller will come see the patient   Time spent in minutes: 35 DVT prophylaxis: enoxaparin (LOVENOX) injection 40 mg Start: 05/11/20 2315  Code Status: DNR Family Communication: daughter Disposition Plan:  Status is: Inpatient  Remains inpatient appropriate because:treating pneumonia with IV Antibiotics. Treating dehydration with IVF.    Dispo: The patient is from: Home              Anticipated d/c is to: TBD              Anticipated d/c date is: 2 days              Patient currently is not medically stable to d/c.  Consultants:   ortho Procedures:   none Antimicrobials:  Anti-infectives (From admission, onward)   Start     Dose/Rate Route Frequency Ordered Stop   05/12/20 2200  cefTRIAXone (ROCEPHIN) 2 g in sodium chloride 0.9 % 100 mL IVPB        2 g 200 mL/hr over  30 Minutes Intravenous Every 24 hours 05/11/20 2259 05/16/20 2159   05/12/20 2200  azithromycin (ZITHROMAX) 500 mg in sodium chloride 0.9 % 250 mL IVPB        500 mg 250 mL/hr over 60 Minutes Intravenous Every 24 hours 05/11/20 2259 05/16/20 2159   05/11/20 2200  cefTRIAXone (ROCEPHIN) 1 g in sodium chloride 0.9 % 100 mL IVPB        1 g 200 mL/hr over 30 Minutes Intravenous  Once 05/11/20 2151 05/11/20 2255   05/11/20 2200  azithromycin (ZITHROMAX) 500 mg in sodium chloride 0.9 % 250 mL IVPB        500 mg 250 mL/hr over 60 Minutes Intravenous  Once 05/11/20 2151 05/12/20 0004       Objective: Vitals:   05/13/20 0228 05/13/20 0539 05/13/20 0835 05/13/20 1411   BP:  (!) 117/53 (!) 125/57 120/69  Pulse:  80 81 76  Resp:   20 20  Temp:  99.7 F (37.6 C) 99.2 F (37.3 C) 99.9 F (37.7 C)  TempSrc:  Oral Oral Oral  SpO2:  91% 92% 96%  Weight: 69.9 kg     Height:        Intake/Output Summary (Last 24 hours) at 05/13/2020 1534 Last data filed at 05/13/2020 1300 Gross per 24 hour  Intake 240 ml  Output 900 ml  Net -660 ml   Filed Weights   05/11/20 1804 05/12/20 1927 05/13/20 0228  Weight: 62.1 kg 71.2 kg 69.9 kg    Examination: General exam: Appears comfortable - very weak HEENT: PERRLA, oral mucosa moist, no sclera icterus or thrush Respiratory system: Clear to auscultation. Respiratory effort normal. Cardiovascular system: S1 & S2 heard,  No murmurs  Gastrointestinal system: Abdomen soft, non-tender, nondistended. Normal bowel sounds  Central nervous system: Alert and oriented. No focal neurological deficits. Extremities: No cyanosis, clubbing or edema Skin: No rashes or ulcers Psychiatry:  Mood & affect appropriate.    Data Reviewed: I have personally reviewed following labs and imaging studies  CBC: Recent Labs  Lab 05/11/20 1203 05/12/20 0325  WBC 8.0 8.3  HGB 10.7* 10.5*  HCT 32.5* 31.8*  MCV 94.2 96.4  PLT 233 782   Basic Metabolic Panel: Recent Labs  Lab 05/11/20 1203 05/12/20 0325 05/13/20 0828  NA 130* 130* 134*  K 4.1 3.9 3.5  CL 95* 100 102  CO2 25 22 22   GLUCOSE 175* 102* 124*  BUN 18 9 <5*  CREATININE 1.14* 0.61 0.58  CALCIUM 8.8* 8.2* 8.4*   GFR: Estimated Creatinine Clearance: 52.2 mL/min (by C-G formula based on SCr of 0.58 mg/dL). Liver Function Tests: Recent Labs  Lab 05/11/20 1203  AST 22  ALT 15  ALKPHOS 50  BILITOT 0.8  PROT 6.3*  ALBUMIN 3.4*   No results for input(s): LIPASE, AMYLASE in the last 168 hours. No results for input(s): AMMONIA in the last 168 hours. Coagulation Profile: No results for input(s): INR, PROTIME in the last 168 hours. Cardiac Enzymes: No results for  input(s): CKTOTAL, CKMB, CKMBINDEX, TROPONINI in the last 168 hours. BNP (last 3 results) No results for input(s): PROBNP in the last 8760 hours. HbA1C: No results for input(s): HGBA1C in the last 72 hours. CBG: No results for input(s): GLUCAP in the last 168 hours. Lipid Profile: No results for input(s): CHOL, HDL, LDLCALC, TRIG, CHOLHDL, LDLDIRECT in the last 72 hours. Thyroid Function Tests: Recent Labs    05/12/20 0325  TSH 1.425   Anemia Panel: No results  for input(s): VITAMINB12, FOLATE, FERRITIN, TIBC, IRON, RETICCTPCT in the last 72 hours. Urine analysis:    Component Value Date/Time   COLORURINE YELLOW 05/11/2020 1925   APPEARANCEUR HAZY (A) 05/11/2020 1925   LABSPEC 1.011 05/11/2020 1925   PHURINE 6.0 05/11/2020 1925   GLUCOSEU NEGATIVE 05/11/2020 1925   GLUCOSEU NEGATIVE 05/06/2016 1708   HGBUR NEGATIVE 05/11/2020 1925   BILIRUBINUR NEGATIVE 05/11/2020 1925   KETONESUR NEGATIVE 05/11/2020 1925   PROTEINUR NEGATIVE 05/11/2020 1925   UROBILINOGEN 0.2 05/06/2016 1708   NITRITE NEGATIVE 05/11/2020 1925   LEUKOCYTESUR TRACE (A) 05/11/2020 1925   Sepsis Labs: @LABRCNTIP (procalcitonin:4,lacticidven:4) ) Recent Results (from the past 240 hour(s))  Respiratory Panel by RT PCR (Flu A&B, Covid) - Nasopharyngeal Swab     Status: None   Collection Time: 05/11/20 12:08 PM   Specimen: Nasopharyngeal Swab  Result Value Ref Range Status   SARS Coronavirus 2 by RT PCR NEGATIVE NEGATIVE Final    Comment: (NOTE) SARS-CoV-2 target nucleic acids are NOT DETECTED.  The SARS-CoV-2 RNA is generally detectable in upper respiratoy specimens during the acute phase of infection. The lowest concentration of SARS-CoV-2 viral copies this assay can detect is 131 copies/mL. A negative result does not preclude SARS-Cov-2 infection and should not be used as the sole basis for treatment or other patient management decisions. A negative result may occur with  improper specimen  collection/handling, submission of specimen other than nasopharyngeal swab, presence of viral mutation(s) within the areas targeted by this assay, and inadequate number of viral copies (<131 copies/mL). A negative result must be combined with clinical observations, patient history, and epidemiological information. The expected result is Negative.  Fact Sheet for Patients:  PinkCheek.be  Fact Sheet for Healthcare Providers:  GravelBags.it  This test is no t yet approved or cleared by the [place] FDA and  has been authorized for detection and/or diagnosis of SARS-CoV-2 by FDA under an Emergency Use Authorization (EUA). This EUA will remain  in effect (meaning this test can be used) for the duration of the COVID-19 declaration under Section 564(b)(1) of the Act, 21 U.S.C. section 360bbb-3(b)(1), unless the authorization is terminated or revoked sooner.     Influenza A by PCR NEGATIVE NEGATIVE Final   Influenza B by PCR NEGATIVE NEGATIVE Final    Comment: (NOTE) The Xpert Xpress SARS-CoV-2/FLU/RSV assay is intended as an aid in  the diagnosis of influenza from Nasopharyngeal swab specimens and  should not be used as a sole basis for treatment. Nasal washings and  aspirates are unacceptable for Xpert Xpress SARS-CoV-2/FLU/RSV  testing.  Fact Sheet for Patients: PinkCheek.be  Fact Sheet for Healthcare Providers: GravelBags.it  This test is not yet approved or cleared by the [place] FDA and  has been authorized for detection and/or diagnosis of SARS-CoV-2 by  FDA under an Emergency Use Authorization (EUA). This EUA will remain  in effect (meaning this test can be used) for the duration of the  Covid-19 declaration under Section 564(b)(1) of the Act, 21  U.S.C. section 360bbb-3(b)(1), unless the authorization is  terminated or revoked. Performed at [facility], [place] [address] [place], [place] [address]   Blood culture (routine x 2)     Status: None (Preliminary result)   Collection Time: 05/11/20 10:00 PM   Specimen: BLOOD RIGHT FOREARM  Result Value Ref Range Status   Specimen Description BLOOD RIGHT FOREARM  Final   Special Requests   Final    BOTTLES DRAWN AEROBIC AND ANAEROBIC  Blood Culture results may not be optimal due to an inadequate volume of blood received in culture bottles   Culture   Final    NO GROWTH 2 DAYS Performed at [facility] [address] [place], [place] [address]    Report Status PENDING  Incomplete  Blood culture (routine x 2)     Status: None (Preliminary result)   Collection Time: 05/11/20 10:10 PM   Specimen: BLOOD  Result Value Ref Range Status   Specimen Description BLOOD RIGHT ANTECUBITAL  Final   Special Requests   Final    BOTTLES DRAWN AEROBIC AND ANAEROBIC Blood Culture adequate volume   Culture   Final    NO GROWTH 2 DAYS Performed at [facility], [place] [address], [place], [place] [address]    Report Status PENDING  Incomplete         Radiology Studies: CT Head Wo Contrast  Result Date: 05/11/2020 CLINICAL DATA:  Head trauma.  Multiple falls.  Head injury. EXAM: CT HEAD WITHOUT CONTRAST TECHNIQUE: Contiguous axial images were obtained from the base of the skull through the vertex without intravenous contrast. COMPARISON:  04/24/2020 FINDINGS: Brain: Stable degree of atrophy and chronic small vessel ischemia. No intracranial hemorrhage, mass effect, or midline shift. No hydrocephalus. The basilar cisterns are patent. No evidence of territorial infarct or acute ischemia. No extra-axial or intracranial fluid collection. Vascular: No hyperdense vessel. Air in the right cavernous sinus is typically related to IV catheter placement. Skull: No acute fracture. Stable small incidental bilateral skull exostosis. Sinuses/Orbits: Paranasal sinuses and mastoid air cells are clear. The  visualized orbits are unremarkable. Bilateral lens resection. Other: None. IMPRESSION: 1. No acute intracranial abnormality. No skull fracture. 2. Stable atrophy and chronic small vessel ischemia. Electronically Signed   By: Keith Rake M.D.   On: 05/11/2020 20:10   CT Angio Chest PE W and/or Wo Contrast  Result Date: 05/11/2020 CLINICAL DATA:  Abnormal chest radiograph, fever, abdominal pain, left lower quadrant pain EXAM: CT ANGIOGRAPHY CHEST CT ABDOMEN AND PELVIS WITH CONTRAST TECHNIQUE: Multidetector CT imaging of the chest was performed using the standard protocol during bolus administration of intravenous contrast. Multiplanar CT image reconstructions and MIPs were obtained to evaluate the vascular anatomy. Multidetector CT imaging of the abdomen and pelvis was performed using the standard protocol during bolus administration of intravenous contrast. CONTRAST:  120mL OMNIPAQUE IOHEXOL 350 MG/ML SOLN COMPARISON:  Chest radiograph 05/11/2020, CT abdomen pelvis 04/07/2018 thyroid ultrasound 01/16/2019, MR abdomen 10/08/2011 FINDINGS: CTA CHEST FINDINGS Cardiovascular: Satisfactory opacification the pulmonary arteries to the segmental level. No pulmonary artery filling defects are identified. Some mild dilatation of the left and right main pulmonary arteries, can reflect a degree of pulmonary artery hypertension. Cardiac size is upper limits normal. No pericardial effusion. Few scattered coronary artery calcifications. The aortic root is suboptimally assessed given cardiac pulsation artifact. Atherosclerotic plaque within the normal caliber aorta. No acute luminal abnormality of the imaged aorta. No periaortic stranding or hemorrhage. Shared origin of the brachiocephalic and left common carotid arteries. Proximal great vessels are otherwise unremarkable. Mediastinum/Nodes: No mediastinal fluid or gas. Few subcentimeter thyroid nodules in the right lobe of the gland have previously been seen comparison  ultrasound and warrant no further evaluation. No acute abnormality of the trachea or thoracic esophagus. Few scattered calcified mediastinal and hilar nodes are present, may reflect sequela of prior granulomatous disease. No worrisome enlarged mediastinal, hilar or axillary adenopathy. Lungs/Pleura: There is masslike consolidative opacity situated in  the posterior segment of the right upper lobe with some surrounding areas of ground-glass attenuation and extension across the minor fissure into the medial segment right middle lobe as well (4/67). While several of the airways truncated in this region there is no significant narrowing of vessels or proximal hilar airways. Some additional patchy opacity and ground-glass is present in the superior segment right lower lobe separate from this larger focal consolidation (4/71). Separate 4 mm nodule present in the left lung apex 4/23). Some mild pleural thickening is seen along both the major and minor fissures of the right lung. Mild interlobular septal thickening is present as well. A small right pleural effusion is present as well with dependent atelectasis. Musculoskeletal: Cervical spondylitic changes including some possible degenerative bony fusion C6-7. Additional mild-to-moderate spondylitic changes in the thoracic spine. No acute or worrisome osseous lesion. 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 Review of the MIP images confirms the above findings. CT ABDOMEN and PELVIS FINDINGS Hepatobiliary: No worrisome focal liver lesions. Smooth liver surface contour. Normal hepatic attenuation. Prominent fold at the gallbladder body. Gallbladder distention is within physiologic normal. No visible calcified gallstones. No pericholecystic inflammation. No biliary ductal dilatation. Pancreas: Moderate pancreatic atrophy. Otherwise unremarkable. No pancreatic ductal dilatation or surrounding  inflammatory changes. Spleen: Normal in size. No concerning splenic lesions. Adrenals/Urinary Tract: Normal adrenal glands. Kidneys enhance and excrete symmetrically. Stable bandlike region of scarring in the interpolar left kidney. No concerning renal mass. Bilateral extrarenal pelves are similar to comparison. No frank hydronephrosis or urolithiasis. Urinary bladder is unremarkable. Stomach/Bowel: Small sliding-type hiatal hernia. Distal stomach and duodenum are unremarkable. No small bowel thickening or dilatation. A normal appendix is visualized. Moderate colonic stool burden. No colonic dilatation or wall thickening. No evidence of bowel obstruction. Vascular/Lymphatic: Atherosclerotic calcifications within the abdominal aorta and branch vessels. No aneurysm or ectasia. No enlarged abdominopelvic lymph nodes. Reproductive: Uterus is surgically absent. No concerning adnexal lesions. Other: No abdominopelvic free fluid or free gas. No bowel containing hernias. Musculoskeletal: Stepwise retrolisthesis L1-L4. No associated spondylolysis. Prior L4-5 posterior decompression, fusion and interbody spacer placement. Some mild degenerative changes seen at the SI joints, left greater than right, and bilateral hips. No acute or worrisome osseous lesions. Review of the MIP images confirms the above findings. IMPRESSION: 1. No evidence of acute pulmonary embolism. 2. Masslike opacity predominantly in the posterior segment of the right upper lobe with some surrounding areas of ground-glass attenuation and extension across the minor fissure into the medial segment right middle lobe and superior segment right lower lobe. Given air bronchograms, additional patchy opacity and ground-glass in the right lower lobe and the setting of fever, appearance is most concerning for pneumonia though an underlying malignancy cannot be excluded. Recommend follow-up to resolution. 3. Suspect some mild interstitial edematous features as well  with interlobular septal thickening and vascular redistribution. 4. Small right pleural effusion. 5. No acute findings in the abdomen or pelvis. 6. Moderate colonic stool burden. Correlate for features of constipation. 7. 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 Consider referral for outpatient breast imaging if not performed recently. 8. Aortic Atherosclerosis (ICD10-I70.0). 9. Coronary artery calcifications are present. Please note that the presence of coronary artery calcium documents the presence of coronary artery disease, the severity of this disease and any potential stenosis cannot be assessed on this non-gated CT examination. Electronically Signed   By: Lovena Le M.D.   On: 05/11/2020 20:28  CT ABDOMEN PELVIS W CONTRAST  Result Date: 05/11/2020 CLINICAL DATA:  Abnormal chest radiograph, fever, abdominal pain, left lower quadrant pain EXAM: CT ANGIOGRAPHY CHEST CT ABDOMEN AND PELVIS WITH CONTRAST TECHNIQUE: Multidetector CT imaging of the chest was performed using the standard protocol during bolus administration of intravenous contrast. Multiplanar CT image reconstructions and MIPs were obtained to evaluate the vascular anatomy. Multidetector CT imaging of the abdomen and pelvis was performed using the standard protocol during bolus administration of intravenous contrast. CONTRAST:  137mL OMNIPAQUE IOHEXOL 350 MG/ML SOLN COMPARISON:  Chest radiograph 05/11/2020, CT abdomen pelvis 04/07/2018 thyroid ultrasound 01/16/2019, MR abdomen 10/08/2011 FINDINGS: CTA CHEST FINDINGS Cardiovascular: Satisfactory opacification the pulmonary arteries to the segmental level. No pulmonary artery filling defects are identified. Some mild dilatation of the left and right main pulmonary arteries, can reflect a degree of pulmonary artery hypertension. Cardiac size is upper limits normal. No pericardial effusion. Few scattered coronary artery  calcifications. The aortic root is suboptimally assessed given cardiac pulsation artifact. Atherosclerotic plaque within the normal caliber aorta. No acute luminal abnormality of the imaged aorta. No periaortic stranding or hemorrhage. Shared origin of the brachiocephalic and left common carotid arteries. Proximal great vessels are otherwise unremarkable. Mediastinum/Nodes: No mediastinal fluid or gas. Few subcentimeter thyroid nodules in the right lobe of the gland have previously been seen comparison ultrasound and warrant no further evaluation. No acute abnormality of the trachea or thoracic esophagus. Few scattered calcified mediastinal and hilar nodes are present, may reflect sequela of prior granulomatous disease. No worrisome enlarged mediastinal, hilar or axillary adenopathy. Lungs/Pleura: There is masslike consolidative opacity situated in the posterior segment of the right upper lobe with some surrounding areas of ground-glass attenuation and extension across the minor fissure into the medial segment right middle lobe as well (4/67). While several of the airways truncated in this region there is no significant narrowing of vessels or proximal hilar airways. Some additional patchy opacity and ground-glass is present in the superior segment right lower lobe separate from this larger focal consolidation (4/71). Separate 4 mm nodule present in the left lung apex 4/23). Some mild pleural thickening is seen along both the major and minor fissures of the right lung. Mild interlobular septal thickening is present as well. A small right pleural effusion is present as well with dependent atelectasis. Musculoskeletal: Cervical spondylitic changes including some possible degenerative bony fusion C6-7. Additional mild-to-moderate spondylitic changes in the thoracic spine. No acute or worrisome osseous lesion. Bilateral breast prostheses with capsular calcifications and sites concerning for intracapsular rupture  bilaterally. Review of the MIP images confirms the above findings. CT ABDOMEN and PELVIS FINDINGS Hepatobiliary: No worrisome focal liver lesions. Smooth liver surface contour. Normal hepatic attenuation. Prominent fold at the gallbladder body. Gallbladder distention is within physiologic normal. No visible calcified gallstones. No pericholecystic inflammation. No biliary ductal dilatation. Pancreas: Moderate pancreatic atrophy. Otherwise unremarkable. No pancreatic ductal dilatation or surrounding inflammatory changes. Spleen: Normal in size. No concerning splenic lesions. Adrenals/Urinary Tract: Normal adrenal glands. Kidneys enhance and excrete symmetrically. Stable bandlike region of scarring in the interpolar left kidney. No concerning renal mass. Bilateral extrarenal pelves are similar to comparison. No frank hydronephrosis or urolithiasis. Urinary bladder is unremarkable. Stomach/Bowel: Small sliding-type hiatal hernia. Distal stomach and duodenum are unremarkable. No small bowel thickening or dilatation. A normal appendix is visualized. Moderate colonic stool burden. No colonic dilatation or wall thickening. No evidence of bowel obstruction. Vascular/Lymphatic: Atherosclerotic calcifications within the abdominal aorta and branch vessels. No aneurysm or ectasia.  No enlarged abdominopelvic lymph nodes. Reproductive: Uterus is surgically absent. No concerning adnexal lesions. Other: No abdominopelvic free fluid or free gas. No bowel containing hernias. Musculoskeletal: Stepwise retrolisthesis L1-L4. No associated spondylolysis. Prior L4-5 posterior decompression, fusion and interbody spacer placement. Some mild degenerative changes seen at the SI joints, left greater than right, and bilateral hips. No acute or worrisome osseous lesions. Review of the MIP images confirms the above findings. IMPRESSION: 1. No evidence of acute pulmonary embolism. 2. Masslike opacity predominantly in the posterior segment of the  right upper lobe with some surrounding areas of ground-glass attenuation and extension across the minor fissure into the medial segment right middle lobe and superior segment right lower lobe. Given air bronchograms, additional patchy opacity and ground-glass in the right lower lobe and the setting of fever, appearance is most concerning for pneumonia though an underlying malignancy cannot be excluded. Recommend follow-up to resolution. 3. Suspect some mild interstitial edematous features as well with interlobular septal thickening and vascular redistribution. 4. Small right pleural effusion. 5. No acute findings in the abdomen or pelvis. 6. Moderate colonic stool burden. Correlate for features of constipation. 7. 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 Consider referral for outpatient breast imaging if not performed recently. 8. Aortic Atherosclerosis (ICD10-I70.0). 9. Coronary artery calcifications are present. Please note that the presence of coronary artery calcium documents the presence of coronary artery disease, the severity of this disease and any potential stenosis cannot be assessed on this non-gated CT examination. Electronically Signed   By: Lovena Le M.D.   On: 05/11/2020 20:28   DG Foot Complete Right  Result Date: 05/11/2020 CLINICAL DATA:  Status post fall. EXAM: RIGHT FOOT COMPLETE - 3+ VIEW COMPARISON:  None. FINDINGS: A very small area of cortical irregularity is seen along the plantar aspect of the right cuboid bone. Adjacent 3 mm curvilinear cortical density is noted. Mild degenerative changes seen along the metatarsophalangeal articulation of the right great toe. An associated hallux valgus deformity is present. Mild dorsal soft tissue swelling is seen along the mid right foot. IMPRESSION: Very small fracture of indeterminate age along the plantar aspect of the right cuboid bone. Correlation with physical  examination of this region is recommended to determine the presence or absence of point tenderness. Electronically Signed   By: Virgina Norfolk M.D.   On: 05/11/2020 18:37      Scheduled Meds: . ALPRAZolam  1 mg Oral QHS  . amitriptyline  25 mg Oral QHS  . buPROPion  75 mg Oral q morning - 10a  . cycloSPORINE  1 drop Both Eyes BID  . DULoxetine  90 mg Oral QHS  . enoxaparin (LOVENOX) injection  40 mg Subcutaneous QHS  . guaiFENesin  600 mg Oral BID  . levothyroxine  75 mcg Oral Daily  . melatonin  3 mg Oral QHS  . pantoprazole  40 mg Oral Daily   Continuous Infusions: . sodium chloride 75 mL/hr at 05/13/20 1040  . azithromycin 500 mg (05/12/20 2238)  . cefTRIAXone (ROCEPHIN)  IV 2 g (05/12/20 2158)     LOS: 2 days      Debbe Odea, MD [facility] Pager: [email or web address] 05/13/2020, 3:34 PM

## 2020-05-13 NOTE — Progress Notes (Signed)
SATURATION QUALIFICATIONS: (This note is used to comply with regulatory documentation for home oxygen)  Patient Saturations on Room Air at Rest = 92 %  Patient Saturations on Room Air while Ambulating = 74 %  Patient Saturations on 4 Liters of oxygen while Ambulating 78 %  Please briefly explain why patient needs home oxygen: Patient O2 level drops with activities. Patient needs home oxygen.

## 2020-05-14 DIAGNOSIS — E871 Hypo-osmolality and hyponatremia: Secondary | ICD-10-CM | POA: Diagnosis not present

## 2020-05-14 DIAGNOSIS — R413 Other amnesia: Secondary | ICD-10-CM | POA: Diagnosis not present

## 2020-05-14 DIAGNOSIS — R638 Other symptoms and signs concerning food and fluid intake: Secondary | ICD-10-CM

## 2020-05-14 DIAGNOSIS — J9601 Acute respiratory failure with hypoxia: Secondary | ICD-10-CM | POA: Diagnosis not present

## 2020-05-14 DIAGNOSIS — J189 Pneumonia, unspecified organism: Secondary | ICD-10-CM | POA: Diagnosis not present

## 2020-05-14 LAB — LEGIONELLA PNEUMOPHILA SEROGP 1 UR AG: L. pneumophila Serogp 1 Ur Ag: NEGATIVE

## 2020-05-14 MED ORDER — ENSURE ENLIVE PO LIQD
237.0000 mL | Freq: Three times a day (TID) | ORAL | Status: DC
  Administered 2020-05-14 – 2020-05-18 (×9): 237 mL via ORAL

## 2020-05-14 MED ORDER — SODIUM CHLORIDE 0.9 % IV SOLN
INTRAVENOUS | Status: DC | PRN
  Administered 2020-05-14 – 2020-05-15 (×3): 1000 mL via INTRAVENOUS

## 2020-05-14 NOTE — Progress Notes (Signed)
Sputum sample needs to be collected. Provided patient with cup and educated about collection.

## 2020-05-14 NOTE — Progress Notes (Signed)
Offered to ambulate with patient, she declined at this time, wanted to rest.

## 2020-05-14 NOTE — Progress Notes (Signed)
SATURATION QUALIFICATIONS: (This note is used to comply with regulatory documentation for home oxygen)  Patient Saturations on Room Air at Rest = 92%  Patient Saturations on Room Air while Ambulating = 78%  Patient Saturations on 3 Liters of oxygen while Ambulating = 88%  Please briefly explain why patient needs home oxygen:

## 2020-05-14 NOTE — Progress Notes (Signed)
PROGRESS NOTE    [name]   GEX:[id number]  DOB: Mar 14, 1941  DOA: 05/11/2020 PCP: Reynold Bowen, MD   Brief Narrative:  [name]   is a 79 y.o. female with medical history significant for HTN, GERD, hypothyroidism, depression/anxiety, and chronic back pain who presents to the ED for evaluation of fevers, chills, and malaise.  Initial vitals showed BP 87/45, pulse 65, RR 18, temp 99.3 Fahrenheit, SPO2 97% on room air. While in the ED patient was noted to become hypoxic with SPO2 86% on room air, improved to >92% on 2 L O2 via [place]. Also tachypneic with RR up to 29. CTA chest/abdomen/pelvis showed masslike opacity in the posterior segment of the right upper lobe with surrounding groundglass attenuation and extension across the minor fissure into the medial right middle lobe and superior right lower lobe suspicious for pneumonia, malignancy cannot be excluded.   Right foot x-ray showed very small fracture of indeterminate age along the plantar aspect of the right cuboid bone.  Patient was given 1 L normal saline and started on IV ceftriaxone and azithromycin. The hospitalist service was consulted to admit for further evaluation and management.  Subjective: Continues to have severe weakness and poor solid and liquid intake.   Assessment & Plan:   Principal Problem:   Acute respiratory failure with hypoxia-  Right upper lobe pneumonia - Resp panel, Influenza and Covid neg - strep Urine antigen negative - treating as CAP with Ceftriaxone and Azithromycin- will need repeat imaging in a few weeks to ensure no cancer  Active Problems:     Hyponatremia/ dehydration/ elevated Cr - given IVF in ED - Cr improved from 114 to 0.61  - Sodium still 130 > 134- - will continue slow NS until I see she is able to drink > 6 cups of liquid a day - weight 154 yesterday and 153 today so not fluid overloaded - adding Ensure today- if she drinks enough, can likely dc fluid later today     Memory loss or impairment - daughter states patient has mild memory loss- no sundowning thus far- follow    Hypotension with h/o HTN (hypertension), benign - holding  Lasix, Norvasc, Cozaar, Metoprolol as BP was low - it is beginning to rise- start Norvasc    Hypothyroidism - cont Synthroid    Anxiety with depression - cont Wellburin, Cymbalta, Xanax, Elavil  GERD - cont PPI  Cuboid fracture of indeterminate age - she has no pain and doesn't recall falling- she has some bruising by her 2nd and third toes - have asked for an ortho opinion- Ortho has seen the patient and feels this is an old fracture.    Time spent in minutes: 35 DVT prophylaxis: enoxaparin (LOVENOX) injection 40 mg Start: 05/11/20 2315  Code Status: DNR Family Communication: daughter Disposition Plan:  Status is: Inpatient  Remains inpatient appropriate because:treating pneumonia with IV Antibiotics. Treating dehydration with IVF.    Dispo: The patient is from: Home              Anticipated d/c is to: TBD              Anticipated d/c date is: 2 days              Patient currently is not medically stable to d/c.  Consultants:   ortho Procedures:   none Antimicrobials:  Anti-infectives (From admission, onward)   Start     Dose/Rate Route Frequency Ordered Stop   05/12/20  2200  cefTRIAXone (ROCEPHIN) 2 g in sodium chloride 0.9 % 100 mL IVPB        2 g 200 mL/hr over 30 Minutes Intravenous Every 24 hours 05/11/20 2259 05/16/20 2159   05/12/20 2200  azithromycin (ZITHROMAX) 500 mg in sodium chloride 0.9 % 250 mL IVPB        500 mg 250 mL/hr over 60 Minutes Intravenous Every 24 hours 05/11/20 2259 05/16/20 2159   05/11/20 2200  cefTRIAXone (ROCEPHIN) 1 g in sodium chloride 0.9 % 100 mL IVPB        1 g 200 mL/hr over 30 Minutes Intravenous  Once 05/11/20 2151 05/11/20 2255   05/11/20 2200  azithromycin (ZITHROMAX) 500 mg in sodium chloride 0.9 % 250 mL IVPB        500 mg 250 mL/hr over 60 Minutes  Intravenous  Once 05/11/20 2151 05/12/20 0004       Objective: Vitals:   05/14/20 0422 05/14/20 0425 05/14/20 0744 05/14/20 1255  BP:  (!) 146/74 137/70 130/64  Pulse:  87 79 95  Resp:  17 20 20   Temp:  98.2 F (36.8 C) 98.2 F (36.8 C) 97.6 F (36.4 C)  TempSrc:  Oral Oral Oral  SpO2:  97% 93% 93%  Weight: 69.8 kg     Height:        Intake/Output Summary (Last 24 hours) at 05/14/2020 1438 Last data filed at 05/14/2020 1400 Gross per 24 hour  Intake 1422.82 ml  Output 1651 ml  Net -228.18 ml   Filed Weights   05/12/20 1927 05/13/20 0228 05/14/20 0422  Weight: 71.2 kg 69.9 kg 69.8 kg    Examination: General exam: Appears comfortable  HEENT: PERRLA, oral mucosa moist, no sclera icterus or thrush Respiratory system: Clear to auscultation. Respiratory effort normal. Cardiovascular system: S1 & S2 heard,  No murmurs  Gastrointestinal system: Abdomen soft, non-tender, nondistended. Normal bowel sounds   Central nervous system: Alert and oriented. No focal neurological deficits. Extremities: No cyanosis, clubbing or edema Skin: No rashes or ulcers Psychiatry:  Mood & affect appropriate.    Data Reviewed: I have personally reviewed following labs and imaging studies  CBC: Recent Labs  Lab 05/11/20 1203 05/12/20 0325  WBC 8.0 8.3  HGB 10.7* 10.5*  HCT 32.5* 31.8*  MCV 94.2 96.4  PLT 233 527   Basic Metabolic Panel: Recent Labs  Lab 05/11/20 1203 05/12/20 0325 05/13/20 0828  NA 130* 130* 134*  K 4.1 3.9 3.5  CL 95* 100 102  CO2 25 22 22   GLUCOSE 175* 102* 124*  BUN 18 9 <5*  CREATININE 1.14* 0.61 0.58  CALCIUM 8.8* 8.2* 8.4*   GFR: Estimated Creatinine Clearance: 52.2 mL/min (by C-G formula based on SCr of 0.58 mg/dL). Liver Function Tests: Recent Labs  Lab 05/11/20 1203  AST 22  ALT 15  ALKPHOS 50  BILITOT 0.8  PROT 6.3*  ALBUMIN 3.4*   No results for input(s): LIPASE, AMYLASE in the last 168 hours. No results for input(s): AMMONIA in the  last 168 hours. Coagulation Profile: No results for input(s): INR, PROTIME in the last 168 hours. Cardiac Enzymes: No results for input(s): CKTOTAL, CKMB, CKMBINDEX, TROPONINI in the last 168 hours. BNP (last 3 results) No results for input(s): PROBNP in the last 8760 hours. HbA1C: No results for input(s): HGBA1C in the last 72 hours. CBG: No results for input(s): GLUCAP in the last 168 hours. Lipid Profile: No results for input(s): CHOL, HDL, LDLCALC, TRIG, CHOLHDL, LDLDIRECT  in the last 72 hours. Thyroid Function Tests: Recent Labs    05/12/20 0325  TSH 1.425   Anemia Panel: No results for input(s): VITAMINB12, FOLATE, FERRITIN, TIBC, IRON, RETICCTPCT in the last 72 hours. Urine analysis:    Component Value Date/Time   COLORURINE YELLOW 05/11/2020 1925   APPEARANCEUR HAZY (A) 05/11/2020 1925   LABSPEC 1.011 05/11/2020 1925   PHURINE 6.0 05/11/2020 1925   GLUCOSEU NEGATIVE 05/11/2020 1925   GLUCOSEU NEGATIVE 05/06/2016 1708   HGBUR NEGATIVE 05/11/2020 1925   BILIRUBINUR NEGATIVE 05/11/2020 1925   KETONESUR NEGATIVE 05/11/2020 1925   PROTEINUR NEGATIVE 05/11/2020 1925   UROBILINOGEN 0.2 05/06/2016 1708   NITRITE NEGATIVE 05/11/2020 1925   LEUKOCYTESUR TRACE (A) 05/11/2020 1925   Sepsis Labs: @LABRCNTIP (procalcitonin:4,lacticidven:4) ) Recent Results (from the past 240 hour(s))  Respiratory Panel by RT PCR (Flu A&B, Covid) - Nasopharyngeal Swab     Status: None   Collection Time: 05/11/20 12:08 PM   Specimen: Nasopharyngeal Swab  Result Value Ref Range Status   SARS Coronavirus 2 by RT PCR NEGATIVE NEGATIVE Final    Comment: (NOTE) SARS-CoV-2 target nucleic acids are NOT DETECTED.  The SARS-CoV-2 RNA is generally detectable in upper respiratoy specimens during the acute phase of infection. The lowest concentration of SARS-CoV-2 viral copies this assay can detect is 131 copies/mL. A negative result does not preclude SARS-Cov-2 infection and should not be used as  the sole basis for treatment or other patient management decisions. A negative result may occur with  improper specimen collection/handling, submission of specimen other than nasopharyngeal swab, presence of viral mutation(s) within the areas targeted by this assay, and inadequate number of viral copies (<131 copies/mL). A negative result must be combined with clinical observations, patient history, and epidemiological information. The expected result is Negative.  Fact Sheet for Patients:  PinkCheek.be  Fact Sheet for Healthcare Providers:  GravelBags.it  This test is no t yet approved or cleared by the [place] FDA and  has been authorized for detection and/or diagnosis of SARS-CoV-2 by FDA under an Emergency Use Authorization (EUA). This EUA will remain  in effect (meaning this test can be used) for the duration of the COVID-19 declaration under Section 564(b)(1) of the Act, 21 U.S.C. section 360bbb-3(b)(1), unless the authorization is terminated or revoked sooner.     Influenza A by PCR NEGATIVE NEGATIVE Final   Influenza B by PCR NEGATIVE NEGATIVE Final    Comment: (NOTE) The Xpert Xpress SARS-CoV-2/FLU/RSV assay is intended as an aid in  the diagnosis of influenza from Nasopharyngeal swab specimens and  should not be used as a sole basis for treatment. Nasal washings and  aspirates are unacceptable for Xpert Xpress SARS-CoV-2/FLU/RSV  testing.  Fact Sheet for Patients: PinkCheek.be  Fact Sheet for Healthcare Providers: GravelBags.it  This test is not yet approved or cleared by the [place] FDA and  has been authorized for detection and/or diagnosis of SARS-CoV-2 by  FDA under an Emergency Use Authorization (EUA). This EUA will remain  in effect (meaning this test can be used) for the duration of the  Covid-19 declaration under Section 564(b)(1)  of the Act, 21  U.S.C. section 360bbb-3(b)(1), unless the authorization is  terminated or revoked. Performed at [facility], [place] [address] [place], [place] [address]   Blood culture (routine x 2)     Status: None (Preliminary result)   Collection Time: 05/11/20 10:00 PM   Specimen: BLOOD RIGHT FOREARM  Result Value Ref Range Status  Specimen Description BLOOD RIGHT FOREARM  Final   Special Requests   Final    BOTTLES DRAWN AEROBIC AND ANAEROBIC Blood Culture results may not be optimal due to an inadequate volume of blood received in culture bottles   Culture   Final    NO GROWTH 3 DAYS Performed at [facility], [place] [address], [place], [place] [address]    Report Status PENDING  Incomplete  Blood culture (routine x 2)     Status: None (Preliminary result)   Collection Time: 05/11/20 10:10 PM   Specimen: BLOOD  Result Value Ref Range Status   Specimen Description BLOOD RIGHT ANTECUBITAL  Final   Special Requests   Final    BOTTLES DRAWN AEROBIC AND ANAEROBIC Blood Culture adequate volume   Culture   Final    NO GROWTH 3 DAYS Performed at [facility], [place] [address], [place], [place] [address]    Report Status PENDING  Incomplete         Radiology Studies: No results found.    Scheduled Meds: . ALPRAZolam  1 mg Oral QHS  . amitriptyline  25 mg Oral QHS  . buPROPion  75 mg Oral q morning - 10a  . cycloSPORINE  1 drop Both Eyes BID  . DULoxetine  90 mg Oral QHS  . enoxaparin (LOVENOX) injection  40 mg Subcutaneous QHS  . feeding supplement (ENSURE ENLIVE)  237 mL Oral TID BM  . guaiFENesin  600 mg Oral BID  . levothyroxine  75 mcg Oral Daily  . melatonin  3 mg Oral QHS  . pantoprazole  40 mg Oral Daily   Continuous Infusions: . sodium chloride 75 mL/hr at 05/14/20 1351  . azithromycin 500 mg (05/13/20 2316)  . cefTRIAXone (ROCEPHIN)  IV 2 g (05/13/20 2116)     LOS: 3 days      Debbe Odea, MD [facility] Pager: [email or web address] 05/14/2020, 2:38 PM

## 2020-05-14 NOTE — Consult Note (Signed)
ORTHOPAEDIC CONSULTATION  REQUESTING PHYSICIAN: Debbe Odea, MD  Chief Complaint: Right foot bruising  HPI: [name] is a 79 y.o. female with medical history significant forHTN, GERD, hypothyroidism, depression/anxiety, and chronic back pain who presented to the ED for evaluation of fevers, chills, and malaise.  Patient had bruising on her right foot which was not associated with any pain. X-rays were obtained which show a small fracture on the plantar aspect of right cuboid bone - age indeterminate. Orthopedics was consulted.  Upon exam, patient resting comfortably in bed. She complains of no pain to her extremities. She was able to put full weight on her right foot yesterday while weight bearing without any pain. She has some bruising on the dorsal aspect of her foot. She cannot remember if she has injured this foot before int he past.   Past Medical History:  Diagnosis Date  . Cancer ([place]) 2014   kidney  . Esophageal stricture   . Fibromyalgia   . GERD (gastroesophageal reflux disease)   . Helicobacter pylori gastritis   . Hypertension   . Intention tremor   . Lumbago   . Osteoarthritis of spine   . Pneumonia    hx of years ago   . PUD (peptic ulcer disease)   . PVD (peripheral vascular disease) ([place])    Past Surgical History:  Procedure Laterality Date  . ABDOMINAL HYSTERECTOMY  1985  . BREAST ENHANCEMENT SURGERY  1974  . CATARACT EXTRACTION Bilateral 2014   shapiro  . LAPAROSCOPIC PARTIAL NEPHRECTOMY Left April '13   [facility], Dr. Alinda Money  . LUMBAR FUSION  03/2003   L4-5  . TONSILLECTOMY  1958  . TUBAL LIGATION  1984   Social History   Socioeconomic History  . Marital status: Widowed    Spouse name: Not on file  . Number of children: 2  . Years of education: BA  . Highest education level: Not on file  Occupational History  . Occupation: retired[employer] Service [employer]  . 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 Smoking status: Never  Smoker  . Smokeless tobacco: Never Used  Substance and Sexual Activity  . Alcohol use: Yes    Alcohol/week: 0.0 standard drinks    Comment: occasional glass of wine with dinner   . Drug use: No  . Sexual activity: Not Currently  Other Topics Concern  . Not on file  Social History Narrative   Diet?       Do you drink/eat things with caffeine? yes      Marital status?              married                      What year were you married? 1977      Do you live in a house, apartment, assisted living, condo, trailer, etc.? Own-townhouse      Is it one or more stories? 2 stories      How many persons live in your home? 2 (my husband and I)      Do you have any pets in your home? (please list)0      Highest level of education completed? 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         Not now  Type & how often? 0      Do you have a living will? yes      Do you have a DNR form?    yes                             If not, do you want to discuss one?      Do you have signed POA/HPOA for forms? Yes      Functional Status:    Do you have difficulty bathing or dressing yourself? NO   Do you have difficulty preparing food or eating? NO   Do you have difficulty managing your medications? NO   Do you have difficulty managing your finances? NO   Do you have difficulty affording your medications? NO   Social Determinants of Health   Financial Resource Strain:   . Difficulty of Paying Living Expenses: Not on file  Food Insecurity:   . Worried About Charity fundraiser in the Last Year: Not on file  . Ran Out of Food in the Last Year: Not on file  Transportation Needs:   . Lack of Transportation (Medical): Not on file  . Lack of Transportation (Non-Medical): Not on file  Physical Activity:   . Days of Exercise per Week: Not on file  . Minutes of  Exercise per Session: Not on file  Stress:   . Feeling of Stress : Not on file  Social Connections:   . Frequency of Communication with Friends and Family: Not on file  . Frequency of Social Gatherings with Friends and Family: Not on file  . Attends Religious Services: Not on file  . Active Member of Clubs or Organizations: Not on file  . Attends Archivist Meetings: Not on file  . Marital Status: Not on file   Family History  Problem Relation Age of Onset  . Heart disease Mother   . COPD Mother   . Thyroid disease Mother   . Diabetes Father   . Heart disease Father        CABG  . Stroke Father   . Parkinson's disease Father   . Heart disease Brother   . Stroke Brother   . Hyperlipidemia Brother   . Diabetes Daughter   . Hearing loss Brother   . Crohn's disease Other        neice  . Hypertension Son   . Diabetes Son   . Melanoma Son   . Colon cancer Neg Hx   . Stomach cancer Neg Hx   . Esophageal cancer Neg Hx    Allergies  Allergen Reactions  . Morphine And Related Itching and Rash    Given in IV.   Prior to Admission medications   Medication Sig Start Date End Date Taking? Authorizing Provider  acetaminophen (TYLENOL) 325 MG tablet Take 650 mg by mouth every 6 (six) hours as needed for headache.   Yes [provider]  ALPRAZolam (XANAX) 0.5 MG tablet Take 1 tablet (0.5 mg total) by mouth 2 (two) times daily as needed. for anxiety Patient taking differently: Take 1 mg by mouth at bedtime.  11/14/16  Yes Plotnikov, Evie Lacks, MD  amitriptyline (ELAVIL) 25 MG tablet TAKE 1 TABLET BY MOUTH EVERY DAY AT BEDTIME Patient taking differently: Take 25 mg by mouth at bedtime.  10/23/17  Yes Hoyt Koch, MD  amLODipine (NORVASC) 5 MG tablet Take 1 tablet (5 mg total) by mouth  daily. 11/07/16  Yes Lauree Chandler, NP  Artificial Tear Ointment (DRY EYES OP) Apply 1 drop to eye 3 (three) times daily as needed (for dry eyes).   Yes [provider]  buPROPion (WELLBUTRIN) 75 MG tablet Take 75 mg by mouth every morning. 02/28/20  Yes [provider]  calcium-vitamin D (OSCAL WITH D) 500-200 MG-UNIT per tablet Take 1 tablet by mouth daily.   Yes [provider]  cholecalciferol (VITAMIN D) 1000 UNITS tablet Take 1,000 Units by mouth daily.   Yes [provider]  DULoxetine (CYMBALTA) 30 MG capsule Take 30 mg by mouth See admin instructions. Take 1 capsule (30 mg) combine with 1 capsule (60 mg) by mouth daily   Yes [provider]  DULoxetine (CYMBALTA) 60 MG capsule Take 60 mg by mouth See admin instructions. Take 1 capsule (60 mg) combine with 1 capsule (30 mg) by mouth daily 04/14/18  Yes [provider]  furosemide (LASIX) 20 MG tablet Take 1 tablet (20 mg total) by mouth daily. NEED ANNUAL VISIT FOR FURTHER REFILLS Patient taking differently: Take 20 mg by mouth daily.  04/29/17  Yes Hoyt Koch, MD  levothyroxine (SYNTHROID, LEVOTHROID) 75 MCG tablet Take 75 mcg by mouth daily.  02/06/18  Yes [provider]  losartan (COZAAR) 100 MG tablet Take 1 tablet (100 mg total) by mouth daily. NEED ANNUAL VISIT FOR FURTHER REFILLS 04/29/17 05/11/20 Yes Hoyt Koch, MD  melatonin 3 MG TABS tablet Take 3 mg by mouth at bedtime.   Yes [provider]  meloxicam (MOBIC) 7.5 MG tablet Take 7.5 mg by mouth daily.  03/25/18  Yes [provider]  metoprolol succinate (TOPROL-XL) 50 MG 24 hr tablet Take 1 tablet (50 mg total) by mouth daily. Take with or immediately following a meal. NEED ANNUAL VISIT FOR FURTHER REFILLS 04/29/17  Yes Hoyt Koch, MD  Multiple Vitamins-Minerals (MULTIVITAMIN WITH MINERALS) tablet Take 1 tablet by mouth daily.   Yes [provider]  omeprazole (PRILOSEC) 40 MG capsule Take 1 capsule (40 mg total) by mouth daily. NEED ANNUAL VISIT FOR FURTHER REFILLS Patient taking differently: Take 40 mg by mouth daily.  04/29/17  Yes  Hoyt Koch, MD  polyethylene glycol powder (GLYCOLAX/MIRALAX) powder DISSOLVE 17 GRAMS INTO LIQUID AND DRINK BY MOUTH EVERY DAY Patient taking differently: Take 1 Container by mouth daily.  09/09/16  Yes Hoyt Koch, MD  RESTASIS 0.05 % ophthalmic emulsion Place 1 drop into both eyes 2 (two) times daily. 03/07/20  Yes [provider]  traMADol (ULTRAM) 50 MG tablet TAKE 1 TABLET BY MOUTH EVERY 8 HOURS AS NEEDED FOR MODERATE TO SEVERE PAIN. MAY TAKE 1 ADDITIONAL TABLET DAILY AS NEEDED FOR SEVERE PAIN Patient taking differently: Take 50 mg by mouth 2 (two) times daily.  10/24/16  Yes Hoyt Koch, MD   No results found. Family History Reviewed and non-contributory, no pertinent history of problems with bleeding or anesthesia      Review of Systems 14 system ROS conducted and negative except for that noted in HPI   OBJECTIVE  Vitals: Patient Vitals for the past 8 hrs:  BP Temp Temp src Pulse Resp SpO2 Weight  05/14/20 0425 (!) 146/74 98.2 F (36.8 C) Oral 87 17 97 % --  05/14/20 0422 -- -- -- -- -- -- 69.8 kg  05/14/20 0004 (!) 116/55 98.7 F (37.1 C) Oral 89 18 96 % --   General: Alert, no acute distress  Cardiovascular: Warm extremities noted Respiratory: No cyanosis, no use of accessory musculature GI: No organomegaly, abdomen is soft and non-tender Skin: No lesions in the area of chief complaint other than those listed below in MSK exam.  Neurologic: Sensation intact distally save for the below mentioned MSK exam Psychiatric: Patient is competent for consent with normal mood and affect Lymphatic: No swelling obvious and reported other than the area involved in the exam below  Extremities  Bilateral upper extremities: actively moves upper extremities without pain. Non-tender throughout. NVI. RLE: Actively moves RLE without pain. Non-tender hip/knee/ankle/foot. Mild bruising of dorsum right foot. Non-tender here or area of suspected fracture.  +GSC/EHL/TA.  Sensation intact distally. Warm well perfused foot.  LLE: actively moves extremity without pain. Non-tender throughout. NVI.    Test Results Imaging Right foot x-ray showed a possible small fracture along the plantar aspect of the right cuboid bone - this is likely chronic in nature.  Labs cbc Recent Labs    05/11/20 1203 05/12/20 0325  WBC 8.0 8.3  HGB 10.7* 10.5*  HCT 32.5* 31.8*  PLT 233 227    Labs inflam No results for input(s): CRP in the last 72 hours.  Invalid input(s): ESR  Labs coag No results for input(s): INR, PTT in the last 72 hours.  Invalid input(s): PT  Recent Labs    05/12/20 0325 05/13/20 0828  NA 130* 134*  K 3.9 3.5  CL 100 102  CO2 22 22  GLUCOSE 102* 124*  BUN 9 <5*  CREATININE 0.61 0.58  CALCIUM 8.2* 8.4*     ASSESSMENT AND PLAN: 79 y.o. female with the following: Right foot bruising  Right foot x-ray showed a possible small fracture along the plantar aspect of the right cuboid bone - this is likely chronic in nature. Patient completely asymptomatic. Having no pain with full weight bearing. No further treatment necessary at this time.  - Weight Bearing Status/Activity: WBAT - Additional recommended labs/tests: None - Follow-up plan: Recheck in the office if having any symptoms. Otherwise does not need to f/u in office  Noemi Chapel, PA-C 05/14/2020

## 2020-05-15 DIAGNOSIS — J189 Pneumonia, unspecified organism: Secondary | ICD-10-CM | POA: Diagnosis not present

## 2020-05-15 DIAGNOSIS — R413 Other amnesia: Secondary | ICD-10-CM | POA: Diagnosis not present

## 2020-05-15 DIAGNOSIS — J9601 Acute respiratory failure with hypoxia: Secondary | ICD-10-CM | POA: Diagnosis not present

## 2020-05-15 DIAGNOSIS — E871 Hypo-osmolality and hyponatremia: Secondary | ICD-10-CM | POA: Diagnosis not present

## 2020-05-15 LAB — BASIC METABOLIC PANEL
Anion gap: 8 (ref 5–15)
BUN: 6 mg/dL — ABNORMAL LOW (ref 8–23)
CO2: 24 mmol/L (ref 22–32)
Calcium: 8.4 mg/dL — ABNORMAL LOW (ref 8.9–10.3)
Chloride: 106 mmol/L (ref 98–111)
Creatinine, Ser: 0.48 mg/dL (ref 0.44–1.00)
GFR calc Af Amer: >60 mL/min (ref >60)
GFR calc non Af Amer: >60 mL/min (ref >60)
Glucose, Bld: 103 mg/dL — ABNORMAL HIGH (ref 70–99)
Potassium: 3.6 mmol/L (ref 3.5–5.1)
Sodium: 138 mmol/L (ref 135–145)

## 2020-05-15 MED ORDER — MELATONIN 3 MG PO TABS
3.0000 mg | ORAL_TABLET | Freq: Every day | ORAL | Status: DC
  Administered 2020-05-15 – 2020-05-17 (×3): 3 mg via ORAL
  Filled 2020-05-15 (×3): qty 1

## 2020-05-15 MED ORDER — ALPRAZOLAM 0.5 MG PO TABS
1.0000 mg | ORAL_TABLET | Freq: Every day | ORAL | Status: DC
  Administered 2020-05-15 – 2020-05-17 (×3): 1 mg via ORAL
  Filled 2020-05-15 (×3): qty 2

## 2020-05-15 MED ORDER — DULOXETINE HCL 60 MG PO CPEP
90.0000 mg | ORAL_CAPSULE | Freq: Every day | ORAL | Status: DC
  Administered 2020-05-15 – 2020-05-17 (×3): 90 mg via ORAL
  Filled 2020-05-15 (×3): qty 1

## 2020-05-15 MED ORDER — BISACODYL 5 MG PO TBEC
10.0000 mg | DELAYED_RELEASE_TABLET | Freq: Once | ORAL | Status: AC
  Administered 2020-05-15: 10 mg via ORAL
  Filled 2020-05-15: qty 2

## 2020-05-15 MED ORDER — FUROSEMIDE 10 MG/ML IJ SOLN
20.0000 mg | Freq: Once | INTRAMUSCULAR | Status: AC
  Administered 2020-05-15: 20 mg via INTRAVENOUS
  Filled 2020-05-15: qty 2

## 2020-05-15 MED ORDER — AZITHROMYCIN 500 MG PO TABS
500.0000 mg | ORAL_TABLET | Freq: Every day | ORAL | Status: DC
  Administered 2020-05-15 – 2020-05-17 (×3): 500 mg via ORAL
  Filled 2020-05-15 (×3): qty 1

## 2020-05-15 MED ORDER — GUAIFENESIN ER 600 MG PO TB12
600.0000 mg | ORAL_TABLET | Freq: Two times a day (BID) | ORAL | Status: DC
  Administered 2020-05-15 – 2020-05-18 (×6): 600 mg via ORAL
  Filled 2020-05-15 (×6): qty 1

## 2020-05-15 NOTE — Evaluation (Signed)
Physical Therapy Evaluation Patient Details Name: [name] MRN: [id number] DOB: 04-21-1941 Today's Date: 05/15/2020   History of Present Illness  [name] is a 79 y.o. female with medical history significant for HTN, GERD, hypothyroidism, depression/anxiety, and chronic back pain who presents to the ED for evaluation of fevers, chills, and malaise. Admitted with PNA, r/o lung mass.  Clinical Impression  Patient received in bed, reports she just received lasix to get fluid off and is unsure if she will be able to walk, but agrees to try. She is pleasant and cooperative. She is very independent at baseline and is motivated to return to that. She has low O2 sats on room air, dropping to 85% upon sitting edge of bed. Patient returned to supplemental O2 at 2 lpm as a result. She required min assist for bed mobility and transfers and was unable to walk at this time due to heavy urination with movement. She will continue to benefit from skilled PT while here to improve strength and functional independence for return home.     Follow Up Recommendations Home health PT;Supervision - Intermittent    Equipment Recommendations  Rolling walker with 5" wheels    Recommendations for Other Services OT consult     Precautions / Restrictions Precautions Precautions: Fall Restrictions Weight Bearing Restrictions: No      Mobility  Bed Mobility Overal bed mobility: Needs Assistance Bed Mobility: Supine to Sit;Sit to Supine     Supine to sit: Min assist;HOB elevated Sit to supine: Min assist   General bed mobility comments: due to weakness patient required min assist to raise trunk and to bring LEs back onto bed at end of session. On room air, patient sats in low 90%s at rest. upon sitting up on side of bed sats dropped to 85%. [place] returned to patient with 2 liters supplemental O2 with sats in low to mid 90%s.  Transfers Overall transfer level: Needs assistance Equipment used: Rolling  walker (2 wheeled) Transfers: Sit to/from Stand Sit to Stand: Min assist         General transfer comment: upon standing patient began urinating and could not stop due to being given lasix. She was unable to ambulate due to this. Required changing of bed, gown, socks.  Ambulation/Gait             General Gait Details: unable at this time due to heavy urination due to lasix  Stairs            Wheelchair Mobility    Modified Rankin (Stroke Patients Only)       Balance Overall balance assessment: Needs assistance Sitting-balance support: Feet supported Sitting balance-Leahy Scale: Good     Standing balance support: Bilateral upper extremity supported;During functional activity Standing balance-Leahy Scale: Fair Standing balance comment: reliant on RW and assist due to weakness                             Pertinent Vitals/Pain Pain Assessment: No/denies pain    Home Living Family/patient expects to be discharged to:: Private residence Living Arrangements: Alone Available Help at Discharge: Family;Available PRN/intermittently Type of Home: House Home Access: Level entry     Home Layout: Two level;Able to live on main level with bedroom/bathroom Home Equipment: None      Prior Function Level of Independence: Independent         Comments: patient very active prior to admission, walked 2 miles a  day     Hand Dominance        Extremity/Trunk Assessment   Upper Extremity Assessment Upper Extremity Assessment: Generalized weakness    Lower Extremity Assessment Lower Extremity Assessment: Generalized weakness    Cervical / Trunk Assessment Cervical / Trunk Assessment: Normal  Communication   Communication: No difficulties  Cognition Arousal/Alertness: Awake/alert Behavior During Therapy: WFL for tasks assessed/performed Overall Cognitive Status: Within Functional Limits for tasks assessed                                         General Comments      Exercises     Assessment/Plan    PT Assessment Patient needs continued PT services  PT Problem List Decreased strength;Decreased mobility;Decreased activity tolerance;Decreased balance       PT Treatment Interventions DME instruction;Therapeutic activities;Gait training;Therapeutic exercise;Patient/family education;Stair training;Functional mobility training;Balance training    PT Goals (Current goals can be found in the Care Plan section)  Acute Rehab PT Goals Patient Stated Goal: to get better and return to prior functional level PT Goal Formulation: With patient Time For Goal Achievement: 05/29/20 Potential to Achieve Goals: Good    Frequency Min 3X/week   Barriers to discharge Decreased caregiver support      Co-evaluation               AM-PAC PT "6 Clicks" Mobility  Outcome Measure Help needed turning from your back to your side while in a flat bed without using bedrails?: A Little Help needed moving from lying on your back to sitting on the side of a flat bed without using bedrails?: A Little Help needed moving to and from a bed to a chair (including a wheelchair)?: A Lot Help needed standing up from a chair using your arms (e.g., wheelchair or bedside chair)?: A Little Help needed to walk in hospital room?: A Lot Help needed climbing 3-5 steps with a railing? : A Lot 6 Click Score: 15    End of Session Equipment Utilized During Treatment: Oxygen Activity Tolerance: Patient tolerated treatment well Patient left: in bed;with call bell/phone within reach Nurse Communication: Mobility status PT Visit Diagnosis: Muscle weakness (generalized) (M62.81)    Time: 3151-7616 PT Time Calculation (min) (ACUTE ONLY): 34 min   Charges:   PT Evaluation $PT Eval Moderate Complexity: 1 Mod PT Treatments $Therapeutic Activity: 8-22 mins        Amy Gothard, PT, GCS 05/15/20,3:21 PM

## 2020-05-15 NOTE — Progress Notes (Signed)
PROGRESS NOTE    [name]   WIO:[id number]  DOB: 1940/10/21  DOA: 05/11/2020 PCP: Reynold Bowen, MD   Brief Narrative:  [name]   is a 79 y.o. female with medical history significant for HTN, GERD, hypothyroidism, depression/anxiety, and chronic back pain who presents to the ED for evaluation of fevers, chills, and malaise.  Initial vitals showed BP 87/45, pulse 65, RR 18, temp 99.3 Fahrenheit, SPO2 97% on room air. While in the ED patient was noted to become hypoxic with SPO2 86% on room air, improved to >92% on 2 L O2 via . Also tachypneic with RR up to 29. CTA chest/abdomen/pelvis showed masslike opacity in the posterior segment of the right upper lobe with surrounding groundglass attenuation and extension across the minor fissure into the medial right middle lobe and superior right lower lobe suspicious for pneumonia, malignancy cannot be excluded.   Right foot x-ray showed very small fracture of indeterminate age along the plantar aspect of the right cuboid bone.  Patient was given 1 L normal saline and started on IV ceftriaxone and azithromycin. The hospitalist service was consulted to admit for further evaluation and management.  Subjective: Continues to be week. Abdomen is enlarged today and she is wheezing a little today.   Assessment & Plan:   Principal Problem:   Acute respiratory failure with hypoxia-  Right upper lobe pneumonia - Resp panel, Influenza and Covid neg - strep Urine antigen negative - treating as CAP with Ceftriaxone and Azithromycin but mass line opacity on xray may be cancer - as she is not really improving clinically, I will like to scan her chest in AM- she is in agreement  Active Problems:     Hyponatremia/ dehydration/ elevated Cr - given IVF in ED - Cr improved from 114 to 0.61  - Sodium still 130 > 134- - will continue slow NS until I see she is able to drink > 6 cups of liquid a day - 10/3> weight 154 yesterday and 153 today  so not fluid overloaded - adding Ensure today- if she drinks enough, can likely dc fluid later today -  may be a little fluid overloaded today as I see her weight is up and she has above complaints -  I did stop her fluids this AM - I will give a small dose of Lasix 20 mg IV today- she is in agreement    Memory loss or impairment - daughter states patient has mild memory loss- no sundowning thus far- follow    Hypotension with h/o HTN (hypertension), benign - holding  Lasix, Norvasc, Cozaar, Metoprolol as BP was low     Hypothyroidism - cont Synthroid    Anxiety with depression - cont Wellburin, Cymbalta, Xanax, Elavil  GERD - cont PPI  Cuboid fracture of indeterminate age - she has no pain and doesn't recall falling- she has some bruising by her 2nd and third toes - have asked for an ortho opinion (Dr Percell Miller) - Ortho has seen the patient on 10/3 (PA McBane) and feels this is an old fracture- can weight bear as tolerated   Time spent in minutes: 35 DVT prophylaxis: enoxaparin (LOVENOX) injection 40 mg Start: 05/11/20 2315  Code Status: DNR Family Communication: daughter Disposition Plan:  Status is: Inpatient  Remains inpatient appropriate because:treating pneumonia with IV Antibiotics but does not seem to be improving- fluid overloaded today  Dispo: The patient is from: Home  Anticipated d/c is to: TBD              Anticipated d/c date is: 2 days              Patient currently is not medically stable to d/c.  Consultants:   ortho Procedures:   none Antimicrobials:  Anti-infectives (From admission, onward)   Start     Dose/Rate Route Frequency Ordered Stop   05/15/20 2200  azithromycin (ZITHROMAX) tablet 500 mg       Note to Pharmacy: Pharmacy to change time if needed   500 mg Oral Daily at bedtime 05/15/20 1241     05/12/20 2200  cefTRIAXone (ROCEPHIN) 2 g in sodium chloride 0.9 % 100 mL IVPB        2 g 200 mL/hr over 30 Minutes Intravenous Every 24  hours 05/11/20 2259 05/16/20 2159   05/12/20 2200  azithromycin (ZITHROMAX) 500 mg in sodium chloride 0.9 % 250 mL IVPB  Status:  Discontinued        500 mg 250 mL/hr over 60 Minutes Intravenous Every 24 hours 05/11/20 2259 05/15/20 1241   05/11/20 2200  cefTRIAXone (ROCEPHIN) 1 g in sodium chloride 0.9 % 100 mL IVPB        1 g 200 mL/hr over 30 Minutes Intravenous  Once 05/11/20 2151 05/11/20 2255   05/11/20 2200  azithromycin (ZITHROMAX) 500 mg in sodium chloride 0.9 % 250 mL IVPB        500 mg 250 mL/hr over 60 Minutes Intravenous  Once 05/11/20 2151 05/12/20 0004       Objective: Vitals:   05/14/20 1255 05/14/20 2345 05/15/20 0453 05/15/20 1146  BP: 130/64 135/78 125/67 (!) 144/76  Pulse: 95 76 83 79  Resp: 20 18 20 17   Temp: 97.6 F (36.4 C) 97.7 F (36.5 C) 98.9 F (37.2 C) 99.2 F (37.3 C)  TempSrc: Oral Oral Oral Oral  SpO2: 93% 95% 93% 94%  Weight:   70.9 kg   Height:        Intake/Output Summary (Last 24 hours) at 05/15/2020 1428 Last data filed at 05/15/2020 1401 Gross per 24 hour  Intake 1984.41 ml  Output 2651 ml  Net -666.59 ml   Filed Weights   05/13/20 0228 05/14/20 0422 05/15/20 0453  Weight: 69.9 kg 69.8 kg 70.9 kg    Examination: General exam: Appears comfortable  HEENT: PERRLA, oral mucosa moist, no sclera icterus or thrush Respiratory system: crackles in b/l lower lobes. Respiratory effort normal. Cardiovascular system: S1 & S2 heard,  No murmurs  Gastrointestinal system: Abdomen soft, non-tender, nondistended. Normal bowel sounds  Central nervous system: Alert and oriented. No focal neurological deficits. Extremities: No cyanosis, clubbing or edema Skin: No rashes or ulcers Psychiatry:  Mood & affect appropriate.    Data Reviewed: I have personally reviewed following labs and imaging studies  CBC: Recent Labs  Lab 05/11/20 1203 05/12/20 0325  WBC 8.0 8.3  HGB 10.7* 10.5*  HCT 32.5* 31.8*  MCV 94.2 96.4  PLT 233 810   Basic  Metabolic Panel: Recent Labs  Lab 05/11/20 1203 05/12/20 0325 05/13/20 0828 05/15/20 0404  NA 130* 130* 134* 138  K 4.1 3.9 3.5 3.6  CL 95* 100 102 106  CO2 25 22 22 24   GLUCOSE 175* 102* 124* 103*  BUN 18 9 <5* 6*  CREATININE 1.14* 0.61 0.58 0.48  CALCIUM 8.8* 8.2* 8.4* 8.4*   GFR: Estimated Creatinine Clearance: 52.6 mL/min (by C-G formula based on  SCr of 0.48 mg/dL). Liver Function Tests: Recent Labs  Lab 05/11/20 1203  AST 22  ALT 15  ALKPHOS 50  BILITOT 0.8  PROT 6.3*  ALBUMIN 3.4*   No results for input(s): LIPASE, AMYLASE in the last 168 hours. No results for input(s): AMMONIA in the last 168 hours. Coagulation Profile: No results for input(s): INR, PROTIME in the last 168 hours. Cardiac Enzymes: No results for input(s): CKTOTAL, CKMB, CKMBINDEX, TROPONINI in the last 168 hours. BNP (last 3 results) No results for input(s): PROBNP in the last 8760 hours. HbA1C: No results for input(s): HGBA1C in the last 72 hours. CBG: No results for input(s): GLUCAP in the last 168 hours. Lipid Profile: No results for input(s): CHOL, HDL, LDLCALC, TRIG, CHOLHDL, LDLDIRECT in the last 72 hours. Thyroid Function Tests: No results for input(s): TSH, T4TOTAL, FREET4, T3FREE, THYROIDAB in the last 72 hours. Anemia Panel: No results for input(s): VITAMINB12, FOLATE, FERRITIN, TIBC, IRON, RETICCTPCT in the last 72 hours. Urine analysis:    Component Value Date/Time   COLORURINE YELLOW 05/11/2020 1925   APPEARANCEUR HAZY (A) 05/11/2020 1925   LABSPEC 1.011 05/11/2020 1925   PHURINE 6.0 05/11/2020 1925   GLUCOSEU NEGATIVE 05/11/2020 1925   GLUCOSEU NEGATIVE 05/06/2016 1708   HGBUR NEGATIVE 05/11/2020 1925   BILIRUBINUR NEGATIVE 05/11/2020 1925   KETONESUR NEGATIVE 05/11/2020 1925   PROTEINUR NEGATIVE 05/11/2020 1925   UROBILINOGEN 0.2 05/06/2016 1708   NITRITE NEGATIVE 05/11/2020 1925   LEUKOCYTESUR TRACE (A) 05/11/2020 1925   Sepsis  Labs: @LABRCNTIP (procalcitonin:4,lacticidven:4) ) Recent Results (from the past 240 hour(s))  Respiratory Panel by RT PCR (Flu A&B, Covid) - Nasopharyngeal Swab     Status: None   Collection Time: 05/11/20 12:08 PM   Specimen: Nasopharyngeal Swab  Result Value Ref Range Status   SARS Coronavirus 2 by RT PCR NEGATIVE NEGATIVE Final    Comment: (NOTE) SARS-CoV-2 target nucleic acids are NOT DETECTED.  The SARS-CoV-2 RNA is generally detectable in upper respiratoy specimens during the acute phase of infection. The lowest concentration of SARS-CoV-2 viral copies this assay can detect is 131 copies/mL. A negative result does not preclude SARS-Cov-2 infection and should not be used as the sole basis for treatment or other patient management decisions. A negative result may occur with  improper specimen collection/handling, submission of specimen other than nasopharyngeal swab, presence of viral mutation(s) within the areas targeted by this assay, and inadequate number of viral copies (<131 copies/mL). A negative result must be combined with clinical observations, patient history, and epidemiological information. The expected result is Negative.  Fact Sheet for Patients:  PinkCheek.be  Fact Sheet for Healthcare Providers:  GravelBags.it  This test is no t yet approved or cleared by the [place] FDA and  has been authorized for detection and/or diagnosis of SARS-CoV-2 by FDA under an Emergency Use Authorization (EUA). This EUA will remain  in effect (meaning this test can be used) for the duration of the COVID-19 declaration under Section 564(b)(1) of the Act, 21 U.S.C. section 360bbb-3(b)(1), unless the authorization is terminated or revoked sooner.     Influenza A by PCR NEGATIVE NEGATIVE Final   Influenza B by PCR NEGATIVE NEGATIVE Final    Comment: (NOTE) The Xpert Xpress SARS-CoV-2/FLU/RSV assay is intended as an  aid in  the diagnosis of influenza from Nasopharyngeal swab specimens and  should not be used as a sole basis for treatment. Nasal washings and  aspirates are unacceptable for Xpert Xpress SARS-CoV-2/FLU/RSV  testing.  Fact  Sheet for Patients: PinkCheek.be  Fact Sheet for Healthcare Providers: GravelBags.it  This test is not yet approved or cleared by the [place] FDA and  has been authorized for detection and/or diagnosis of SARS-CoV-2 by  FDA under an Emergency Use Authorization (EUA). This EUA will remain  in effect (meaning this test can be used) for the duration of the  Covid-19 declaration under Section 564(b)(1) of the Act, 21  U.S.C. section 360bbb-3(b)(1), unless the authorization is  terminated or revoked. Performed at [facility], [place] [address], [place], [place] [address]   Blood culture (routine x 2)     Status: None (Preliminary result)   Collection Time: 05/11/20 10:00 PM   Specimen: BLOOD RIGHT FOREARM  Result Value Ref Range Status   Specimen Description BLOOD RIGHT FOREARM  Final   Special Requests   Final    BOTTLES DRAWN AEROBIC AND ANAEROBIC Blood Culture results may not be optimal due to an inadequate volume of blood received in culture bottles   Culture   Final    NO GROWTH 4 DAYS Performed at [facility], [place] [address] [place], [place] [address]    Report Status PENDING  Incomplete  Blood culture (routine x 2)     Status: None (Preliminary result)   Collection Time: 05/11/20 10:10 PM   Specimen: BLOOD  Result Value Ref Range Status   Specimen Description BLOOD RIGHT ANTECUBITAL  Final   Special Requests   Final    BOTTLES DRAWN AEROBIC AND ANAEROBIC Blood Culture adequate volume   Culture   Final    NO GROWTH 4 DAYS Performed at [facility], [place] [address] [place], [place] [address]    Report Status PENDING  Incomplete         Radiology Studies: No results  found.    Scheduled Meds: . ALPRAZolam  1 mg Oral QHS  . amitriptyline  25 mg Oral QHS  . azithromycin  500 mg Oral QHS  . buPROPion  75 mg Oral q morning - 10a  . cycloSPORINE  1 drop Both Eyes BID  . DULoxetine  90 mg Oral QHS  . enoxaparin (LOVENOX) injection  40 mg Subcutaneous QHS  . feeding supplement (ENSURE ENLIVE)  237 mL Oral TID BM  . guaiFENesin  600 mg Oral BID  . levothyroxine  75 mcg Oral Daily  . melatonin  3 mg Oral QHS  . pantoprazole  40 mg Oral Daily   Continuous Infusions: . sodium chloride 1,000 mL (05/15/20 0121)  . cefTRIAXone (ROCEPHIN)  IV 2 g (05/14/20 2347)     LOS: 4 days      Debbe Odea, MD [facility] Pager: [email or web address] 05/15/2020, 2:28 PM

## 2020-05-15 NOTE — Progress Notes (Signed)
SATURATION QUALIFICATIONS: (This note is used to comply with regulatory documentation for home oxygen)  Patient Saturations on Room Air at Rest = 91%  Patient Saturations on Room Air while Ambulating = 89%  Lasix given per order and patient ambulated after several hours. Patient was able to ambulate on RA at this time not desaturating more than 89%. Left patient on the room air. Will keep monitoring.

## 2020-05-16 ENCOUNTER — Inpatient Hospital Stay (HOSPITAL_COMMUNITY): Payer: Medicare Other

## 2020-05-16 DIAGNOSIS — J189 Pneumonia, unspecified organism: Secondary | ICD-10-CM | POA: Diagnosis not present

## 2020-05-16 DIAGNOSIS — F418 Other specified anxiety disorders: Secondary | ICD-10-CM | POA: Diagnosis not present

## 2020-05-16 DIAGNOSIS — J9601 Acute respiratory failure with hypoxia: Secondary | ICD-10-CM | POA: Diagnosis not present

## 2020-05-16 DIAGNOSIS — S92214A Nondisplaced fracture of cuboid bone of right foot, initial encounter for closed fracture: Secondary | ICD-10-CM | POA: Diagnosis not present

## 2020-05-16 LAB — BASIC METABOLIC PANEL
Anion gap: 9 (ref 5–15)
BUN: 6 mg/dL — ABNORMAL LOW (ref 8–23)
CO2: 27 mmol/L (ref 22–32)
Calcium: 8.6 mg/dL — ABNORMAL LOW (ref 8.9–10.3)
Chloride: 101 mmol/L (ref 98–111)
Creatinine, Ser: 0.48 mg/dL (ref 0.44–1.00)
GFR calc Af Amer: >60 mL/min (ref >60)
GFR calc non Af Amer: >60 mL/min (ref >60)
Glucose, Bld: 106 mg/dL — ABNORMAL HIGH (ref 70–99)
Potassium: 3.7 mmol/L (ref 3.5–5.1)
Sodium: 137 mmol/L (ref 135–145)

## 2020-05-16 LAB — CULTURE, BLOOD (ROUTINE X 2)
Culture: NO GROWTH
Culture: NO GROWTH
Special Requests: ADEQUATE

## 2020-05-16 MED ORDER — IOHEXOL 300 MG/ML  SOLN
100.0000 mL | Freq: Once | INTRAMUSCULAR | Status: AC | PRN
  Administered 2020-05-16: 100 mL via INTRAVENOUS

## 2020-05-16 MED ORDER — FUROSEMIDE 10 MG/ML IJ SOLN
40.0000 mg | Freq: Two times a day (BID) | INTRAMUSCULAR | Status: AC
  Administered 2020-05-16 – 2020-05-17 (×2): 40 mg via INTRAVENOUS
  Filled 2020-05-16 (×2): qty 4

## 2020-05-16 NOTE — Care Management Important Message (Signed)
Important Message  Patient Details  Name: [name] MRN: [id number] Date of Birth: 12-Jan-1941   Medicare Important Message Given:  Yes     [name] 05/16/2020, 3:43 PM

## 2020-05-16 NOTE — Progress Notes (Addendum)
PROGRESS NOTE    [name]   HUT:[id number]  DOB: [birth year]  DOA: 05/11/2020 PCP: Reynold Bowen, MD   Brief Narrative:  [name]   is a 79 y.o. female with medical history significant for HTN, GERD, hypothyroidism, depression/anxiety, and chronic back pain who presents to the ED for evaluation of fevers, chills, and malaise.  Initial vitals showed BP 87/45, pulse 65, RR 18, temp 99.3 Fahrenheit, SPO2 97% on room air. While in the ED patient was noted to become hypoxic with SPO2 86% on room air, improved to >92% on 2 L O2 via [place]. Also tachypneic with RR up to 29. CTA chest/abdomen/pelvis showed masslike opacity in the posterior segment of the right upper lobe with surrounding groundglass attenuation and extension across the minor fissure into the medial right middle lobe and superior right lower lobe suspicious for pneumonia, malignancy cannot be excluded.   Right foot x-ray showed very small fracture of indeterminate age along the plantar aspect of the right cuboid bone.  Patient was given 1 L normal saline and started on IV ceftriaxone and azithromycin. The hospitalist service was consulted to admit for further evaluation and management.  Subjective: Continues to be week. Abdomen is enlarged today and she is wheezing a little today.   Assessment & Plan:   Principal Problem:   Acute respiratory failure with hypoxia-  Right upper lobe pneumonia - Resp panel, Influenza and Covid neg - strep Urine antigen negative - treating as CAP with Ceftriaxone and Azithromycin but mass like opacity on xray may be cancer - she remains hypoxic on room air without much clinical improvement thus a  CT chest obtained today- this shows improvement in mass like opacity- will need repeat imaging in a few weeks to ensure complete resolution- she does have b/l pleural effusions - see below - I recommend a total of 7 days of antibiotics which will be 10/7  Active Problems:     Hyponatremia/  dehydration/ elevated Cr - Sodium 130 on admission - given IVF   - Cr improved from 1.14 to 0.48 - 10/3> weight 154 yesterday and 153 today so not fluid overloaded - adding Ensure today- if she drinks enough, can likely dc fluid later today - 10/4>  may be a little fluid overloaded today as I see her weight is up a -  I did stop her fluids this AM - I will give a small dose of Lasix 20 mg IV today- she is in agreement - 10/5> b/l pleural effusions- pulse ox in 80s on exertion- check ECHO-   Lasix  x 2 ordered today- reassess tomorrow to see if she needs more    Memory loss or impairment - daughter states patient has mild memory loss- no sundowning thus far- follow    Hypotension with h/o HTN (hypertension), benign - holding  Lasix, Norvasc, Cozaar, Metoprolol as BP was low     Hypothyroidism - cont Synthroid    Anxiety with depression - cont Wellburin, Cymbalta, Xanax, Elavil  GERD - cont PPI  Cuboid fracture of indeterminate age - she has no pain and doesn't recall falling- she has some bruising by her 2nd and third toes - have asked for an ortho opinion (Dr Percell Miller) - Ortho has seen the patient on 10/3 ([facility]) and feels this is an old fracture- can weight bear as tolerated   Time spent in minutes: 35 DVT prophylaxis: enoxaparin (LOVENOX) injection 40 mg Start: 05/11/20 2315  Code Status: DNR Family Communication:  daughter Disposition Plan:  Status is: Inpatient  Remains inpatient appropriate because:treating pneumonia with IV Antibiotics but does not seem to be improving- fluid overloaded today  Dispo: The patient is from: Home              Anticipated d/c is to: home with daughter and home health              Anticipated d/c date is: 1 day               Patient currently is not medically stable to d/c.  Consultants:   ortho Procedures:   none Antimicrobials:  Anti-infectives (From admission, onward)   Start     Dose/Rate Route Frequency Ordered Stop    05/15/20 2200  azithromycin (ZITHROMAX) tablet 500 mg       Note to Pharmacy: Pharmacy to change time if needed   500 mg Oral Daily at bedtime 05/15/20 1241     05/12/20 2200  cefTRIAXone (ROCEPHIN) 2 g in sodium chloride 0.9 % 100 mL IVPB        2 g 200 mL/hr over 30 Minutes Intravenous Every 24 hours 05/11/20 2259 05/15/20 2156   05/12/20 2200  azithromycin (ZITHROMAX) 500 mg in sodium chloride 0.9 % 250 mL IVPB  Status:  Discontinued        500 mg 250 mL/hr over 60 Minutes Intravenous Every 24 hours 05/11/20 2259 05/15/20 1241   05/11/20 2200  cefTRIAXone (ROCEPHIN) 1 g in sodium chloride 0.9 % 100 mL IVPB        1 g 200 mL/hr over 30 Minutes Intravenous  Once 05/11/20 2151 05/11/20 2255   05/11/20 2200  azithromycin (ZITHROMAX) 500 mg in sodium chloride 0.9 % 250 mL IVPB        500 mg 250 mL/hr over 60 Minutes Intravenous  Once 05/11/20 2151 05/12/20 0004       Objective: Vitals:   05/16/20 0342 05/16/20 1002 05/16/20 1100 05/16/20 1132  BP: 137/81 (!) 138/91    Pulse: 92 73    Resp: 18 16    Temp: 98.8 F (37.1 C) 98.6 F (37 C)    TempSrc: Oral Oral    SpO2: (!) 87% 98% 91% 91%  Weight: 69 kg     Height:        Intake/Output Summary (Last 24 hours) at 05/16/2020 1438 Last data filed at 05/16/2020 1407 Gross per 24 hour  Intake 972.91 ml  Output 4200 ml  Net -3227.09 ml   Filed Weights   05/14/20 0422 05/15/20 0453 05/16/20 0342  Weight: 69.8 kg 70.9 kg 69 kg    Examination: General exam: Appears comfortable  HEENT: PERRLA, oral mucosa moist, no sclera icterus or thrush Respiratory system: crackles in b/l lung fields. Respiratory effort normal. Cardiovascular system: S1 & S2 heard,  No murmurs  Gastrointestinal system: Abdomen soft, non-tender, nondistended. Normal bowel sounds  Central nervous system: Alert and oriented. No focal neurological deficits. Extremities: No cyanosis, clubbing or edema Skin: No rashes or ulcers Psychiatry:  Mood & affect  appropriate.   Data Reviewed: I have personally reviewed following labs and imaging studies  CBC: Recent Labs  Lab 05/11/20 1203 05/12/20 0325  WBC 8.0 8.3  HGB 10.7* 10.5*  HCT 32.5* 31.8*  MCV 94.2 96.4  PLT 233 759   Basic Metabolic Panel: Recent Labs  Lab 05/11/20 1203 05/12/20 0325 05/13/20 0828 05/15/20 0404 05/16/20 0603  NA 130* 130* 134* 138 137  K 4.1 3.9 3.5 3.6  3.7  CL 95* 100 102 106 101  CO2 25 22 22 24 27   GLUCOSE 175* 102* 124* 103* 106*  BUN 18 9 <5* 6* 6*  CREATININE 1.14* 0.61 0.58 0.48 0.48  CALCIUM 8.8* 8.2* 8.4* 8.4* 8.6*   GFR: Estimated Creatinine Clearance: 51.9 mL/min (by C-G formula based on SCr of 0.48 mg/dL). Liver Function Tests: Recent Labs  Lab 05/11/20 1203  AST 22  ALT 15  ALKPHOS 50  BILITOT 0.8  PROT 6.3*  ALBUMIN 3.4*   No results for input(s): LIPASE, AMYLASE in the last 168 hours. No results for input(s): AMMONIA in the last 168 hours. Coagulation Profile: No results for input(s): INR, PROTIME in the last 168 hours. Cardiac Enzymes: No results for input(s): CKTOTAL, CKMB, CKMBINDEX, TROPONINI in the last 168 hours. BNP (last 3 results) No results for input(s): PROBNP in the last 8760 hours. HbA1C: No results for input(s): HGBA1C in the last 72 hours. CBG: No results for input(s): GLUCAP in the last 168 hours. Lipid Profile: No results for input(s): CHOL, HDL, LDLCALC, TRIG, CHOLHDL, LDLDIRECT in the last 72 hours. Thyroid Function Tests: No results for input(s): TSH, T4TOTAL, FREET4, T3FREE, THYROIDAB in the last 72 hours. Anemia Panel: No results for input(s): VITAMINB12, FOLATE, FERRITIN, TIBC, IRON, RETICCTPCT in the last 72 hours. Urine analysis:    Component Value Date/Time   COLORURINE YELLOW 05/11/2020 1925   APPEARANCEUR HAZY (A) 05/11/2020 1925   LABSPEC 1.011 05/11/2020 1925   PHURINE 6.0 05/11/2020 1925   GLUCOSEU NEGATIVE 05/11/2020 1925   GLUCOSEU NEGATIVE 05/06/2016 1708   HGBUR NEGATIVE  05/11/2020 1925   BILIRUBINUR NEGATIVE 05/11/2020 1925   KETONESUR NEGATIVE 05/11/2020 1925   PROTEINUR NEGATIVE 05/11/2020 1925   UROBILINOGEN 0.2 05/06/2016 1708   NITRITE NEGATIVE 05/11/2020 1925   LEUKOCYTESUR TRACE (A) 05/11/2020 1925   Sepsis Labs: @LABRCNTIP (procalcitonin:4,lacticidven:4) ) Recent Results (from the past 240 hour(s))  Respiratory Panel by RT PCR (Flu A&B, Covid) - Nasopharyngeal Swab     Status: None   Collection Time: 05/11/20 12:08 PM   Specimen: Nasopharyngeal Swab  Result Value Ref Range Status   SARS Coronavirus 2 by RT PCR NEGATIVE NEGATIVE Final    Comment: (NOTE) SARS-CoV-2 target nucleic acids are NOT DETECTED.  The SARS-CoV-2 RNA is generally detectable in upper respiratoy specimens during the acute phase of infection. The lowest concentration of SARS-CoV-2 viral copies this assay can detect is 131 copies/mL. A negative result does not preclude SARS-Cov-2 infection and should not be used as the sole basis for treatment or other patient management decisions. A negative result may occur with  improper specimen collection/handling, submission of specimen other than nasopharyngeal swab, presence of viral mutation(s) within the areas targeted by this assay, and inadequate number of viral copies (<131 copies/mL). A negative result must be combined with clinical observations, patient history, and epidemiological information. The expected result is Negative.  Fact Sheet for Patients:  PinkCheek.be  Fact Sheet for Healthcare Providers:  GravelBags.it  This test is no t yet approved or cleared by the [place] FDA and  has been authorized for detection and/or diagnosis of SARS-CoV-2 by FDA under an Emergency Use Authorization (EUA). This EUA will remain  in effect (meaning this test can be used) for the duration of the COVID-19 declaration under Section 564(b)(1) of the Act, 21  U.S.C. section 360bbb-3(b)(1), unless the authorization is terminated or revoked sooner.     Influenza A by PCR NEGATIVE NEGATIVE Final   Influenza B by  PCR NEGATIVE NEGATIVE Final    Comment: (NOTE) The Xpert Xpress SARS-CoV-2/FLU/RSV assay is intended as an aid in  the diagnosis of influenza from Nasopharyngeal swab specimens and  should not be used as a sole basis for treatment. Nasal washings and  aspirates are unacceptable for Xpert Xpress SARS-CoV-2/FLU/RSV  testing.  Fact Sheet for Patients: PinkCheek.be  Fact Sheet for Healthcare Providers: GravelBags.it  This test is not yet approved or cleared by the [place] FDA and  has been authorized for detection and/or diagnosis of SARS-CoV-2 by  FDA under an Emergency Use Authorization (EUA). This EUA will remain  in effect (meaning this test can be used) for the duration of the  Covid-19 declaration under Section 564(b)(1) of the Act, 21  U.S.C. section 360bbb-3(b)(1), unless the authorization is  terminated or revoked. Performed at [facility], [place] [address], [place], [place] [address]   Blood culture (routine x 2)     Status: None   Collection Time: 05/11/20 10:00 PM   Specimen: BLOOD RIGHT FOREARM  Result Value Ref Range Status   Specimen Description BLOOD RIGHT FOREARM  Final   Special Requests   Final    BOTTLES DRAWN AEROBIC AND ANAEROBIC Blood Culture results may not be optimal due to an inadequate volume of blood received in culture bottles   Culture   Final    NO GROWTH 5 DAYS Performed at [facility], [place] [address] [place], [place] [address]    Report Status 05/16/2020 FINAL  Final  Blood culture (routine x 2)     Status: None   Collection Time: 05/11/20 10:10 PM   Specimen: BLOOD  Result Value Ref Range Status   Specimen Description BLOOD RIGHT ANTECUBITAL  Final   Special Requests   Final    BOTTLES DRAWN AEROBIC AND ANAEROBIC  Blood Culture adequate volume   Culture   Final    NO GROWTH 5 DAYS Performed at [facility], [place] [address], [place], [place] [address]    Report Status 05/16/2020 FINAL  Final         Radiology Studies: CT CHEST W CONTRAST  Result Date: 05/16/2020 CLINICAL DATA:  Follow-up pneumonia EXAM: CT CHEST WITH CONTRAST TECHNIQUE: Multidetector CT imaging of the chest was performed during intravenous contrast administration. CONTRAST:  12mL OMNIPAQUE IOHEXOL 300 MG/ML  SOLN COMPARISON:  CT 05/11/2020 FINDINGS: Cardiovascular: Heart size is upper limits of unchanged. No pericardial effusion. Thoracic aorta is normal in course and caliber. Common origin of the brachiocephalic and left common carotid arteries, an anatomic variant. Scattered coronary artery calcification. Main pulmonary trunk is nondilated. Mediastinum/Nodes: No axillary, mediastinal, or hilar lymphadenopathy. Stable appearance of the thyroid gland. Trachea is within normal limits. Small hiatal hernia. Esophagus otherwise within normal limits. Lungs/Pleura: Partial resolution of the previously seen masslike opacity within the right perihilar region. A small amount of residual consolidation and surrounding ground-glass opacity persist at this site. Interval development of patchy areas of ground-glass opacity within the bilateral upper lobes, right worse than left. Small to moderate-sized bilateral pleural effusions with associated compressive atelectasis, increased from prior. No pneumothorax. Upper Abdomen: No acute abnormality. Musculoskeletal: No new or acute osseous findings. 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 IMPRESSION: 1. Partial resolution of the previously seen mass-like opacity within the right perihilar region. A small amount of residual consolidation and surrounding ground-glass opacity persist at this site. Continued follow-up to resolution is recommended. 2. Interval  development of patchy areas of ground-glass opacity within the bilateral upper lobes, right worse than left, which may represent an infectious or inflammatory process. 3. Small to moderate-sized bilateral pleural effusions with associated compressive atelectasis, increased from prior. Electronically Signed   By: Davina Poke D.O.   On: 05/16/2020 09:39      Scheduled Meds: . ALPRAZolam  1 mg Oral QHS  . amitriptyline  25 mg Oral QHS  . azithromycin  500 mg Oral QHS  . buPROPion  75 mg Oral q morning - 10a  . cycloSPORINE  1 drop Both Eyes BID  . DULoxetine  90 mg Oral QHS  . enoxaparin (LOVENOX) injection  40 mg Subcutaneous QHS  . feeding supplement (ENSURE ENLIVE)  237 mL Oral TID BM  . furosemide  40 mg Intravenous BID  . guaiFENesin  600 mg Oral BID  . levothyroxine  75 mcg Oral Daily  . melatonin  3 mg Oral QHS  . pantoprazole  40 mg Oral Daily   Continuous Infusions: . sodium chloride 1,000 mL (05/15/20 2125)     LOS: 5 days      Debbe Odea, MD [facility] Pager: [email or web address] 05/16/2020, 2:38 PM

## 2020-05-16 NOTE — Progress Notes (Signed)
Physical Therapy Treatment Patient Details Name: [name] MRN: [id number] DOB: Jun 09, 1941 Today's Date: 05/16/2020    History of Present Illness [name] is a 79 y.o. female with medical history significant for HTN, GERD, hypothyroidism, depression/anxiety, and chronic back pain who presents to the ED for evaluation of fevers, chills, and malaise. Admitted with PNA, r/o lung mass.    PT Comments    Pts daughter present with questions regarding d/c planning; increased time spent educating pt and daughter on benefits of returning home, but will continue to assess how much assistance will be needed and modify d/c plan if needed; pt performed ambulation with min guard/supervision x 64 ft with O2 on room air dropping for <15 seconds to 88%, o2 remained 91% and greater, RN notified; pt with improved strength with ability to perform bed mobility without assist; pt does continues to demonstrate deficits in balance, strength, endurance and gait and will benefit from skilled PT to address deficits and maximize independence with functional mobility prior to discharge.     Follow Up Recommendations  Home health PT;Supervision - Intermittent     Equipment Recommendations  Rolling walker with 5" wheels;3in1 (PT)    Recommendations for Other Services       Precautions / Restrictions Precautions Precautions: Fall Restrictions Weight Bearing Restrictions: No    Mobility  Bed Mobility Overal bed mobility: Modified Independent Bed Mobility: Supine to Sit     Supine to sit: HOB elevated;Modified independent (Device/Increase time)     General bed mobility comments: with increased time and HOB elevated pt able to perform supine>sit; pt states she has adjustable bed at home  Transfers Overall transfer level: Needs assistance Equipment used: Rolling walker (2 wheeled) Transfers: Sit to/from Stand Sit to Stand: Supervision         General transfer comment: verbal cueing for hand  placement  Ambulation/Gait Ambulation/Gait assistance: Min guard;Supervision Gait Distance (Feet): 64 Feet Assistive device: Rolling walker (2 wheeled)       General Gait Details: pt with decreased velocity and increased UE support on RW during ambulation   Stairs             Wheelchair Mobility    Modified Rankin (Stroke Patients Only)       Balance Overall balance assessment: Needs assistance Sitting-balance support: Feet supported Sitting balance-Leahy Scale: Good     Standing balance support: Bilateral upper extremity supported;During functional activity Standing balance-Leahy Scale: Fair Standing balance comment: reliant on RW and assist due to weakness                            Cognition Arousal/Alertness: Awake/alert Behavior During Therapy: WFL for tasks assessed/performed Overall Cognitive Status: Within Functional Limits for tasks assessed                                        Exercises      General Comments        Pertinent Vitals/Pain Pain Assessment: No/denies pain    Home Living                      Prior Function            PT Goals (current goals can now be found in the care plan section) Acute Rehab PT Goals Patient Stated Goal: to get better and  return to prior functional level PT Goal Formulation: With patient Time For Goal Achievement: 05/29/20 Potential to Achieve Goals: Good Progress towards PT goals: Progressing toward goals    Frequency    Min 3X/week      PT Plan Current plan remains appropriate    Co-evaluation              AM-PAC PT "6 Clicks" Mobility   Outcome Measure  Help needed turning from your back to your side while in a flat bed without using bedrails?: None Help needed moving from lying on your back to sitting on the side of a flat bed without using bedrails?: A Little Help needed moving to and from a bed to a chair (including a wheelchair)?: A  Little Help needed standing up from a chair using your arms (e.g., wheelchair or bedside chair)?: A Little Help needed to walk in hospital room?: A Little Help needed climbing 3-5 steps with a railing? : A Little 6 Click Score: 19    End of Session Equipment Utilized During Treatment: Oxygen;Gait belt Activity Tolerance: Patient tolerated treatment well Patient left: with call bell/phone within reach;in bed;with family/visitor present Nurse Communication: Mobility status PT Visit Diagnosis: Muscle weakness (generalized) (M62.81)     Time: 2122-4825 PT Time Calculation (min) (ACUTE ONLY): 29 min  Charges:  $Gait Training: 8-22 mins $Therapeutic Activity: 8-22 mins                     Lyanne Co, DPT Acute Rehabilitation Services [phone]   [place] 05/16/2020, 11:37 AM

## 2020-05-17 ENCOUNTER — Inpatient Hospital Stay (HOSPITAL_COMMUNITY): Payer: Medicare Other

## 2020-05-17 DIAGNOSIS — S92214A Nondisplaced fracture of cuboid bone of right foot, initial encounter for closed fracture: Secondary | ICD-10-CM | POA: Diagnosis not present

## 2020-05-17 DIAGNOSIS — I5023 Acute on chronic systolic (congestive) heart failure: Secondary | ICD-10-CM

## 2020-05-17 DIAGNOSIS — J189 Pneumonia, unspecified organism: Secondary | ICD-10-CM | POA: Diagnosis not present

## 2020-05-17 DIAGNOSIS — I10 Essential (primary) hypertension: Secondary | ICD-10-CM | POA: Diagnosis not present

## 2020-05-17 DIAGNOSIS — J9601 Acute respiratory failure with hypoxia: Secondary | ICD-10-CM | POA: Diagnosis not present

## 2020-05-17 LAB — ECHOCARDIOGRAM COMPLETE
Area-P 1/2: 2.6 cm2
Height: 62 in
S' Lateral: 3.76 cm
Weight: 2372.8 oz

## 2020-05-17 LAB — BASIC METABOLIC PANEL
Anion gap: 13 (ref 5–15)
BUN: 8 mg/dL (ref 8–23)
CO2: 26 mmol/L (ref 22–32)
Calcium: 9.2 mg/dL (ref 8.9–10.3)
Chloride: 97 mmol/L — ABNORMAL LOW (ref 98–111)
Creatinine, Ser: 0.6 mg/dL (ref 0.44–1.00)
GFR calc non Af Amer: >60 mL/min (ref >60)
Glucose, Bld: 129 mg/dL — ABNORMAL HIGH (ref 70–99)
Potassium: 3.8 mmol/L (ref 3.5–5.1)
Sodium: 136 mmol/L (ref 135–145)

## 2020-05-17 MED ORDER — METOPROLOL SUCCINATE ER 50 MG PO TB24
50.0000 mg | ORAL_TABLET | Freq: Every day | ORAL | Status: DC
  Administered 2020-05-17 – 2020-05-18 (×2): 50 mg via ORAL
  Filled 2020-05-17 (×2): qty 1

## 2020-05-17 MED ORDER — POTASSIUM CHLORIDE CRYS ER 20 MEQ PO TBCR
40.0000 meq | EXTENDED_RELEASE_TABLET | Freq: Two times a day (BID) | ORAL | Status: AC
  Administered 2020-05-17 (×2): 40 meq via ORAL
  Filled 2020-05-17 (×2): qty 2

## 2020-05-17 MED ORDER — LOSARTAN POTASSIUM 25 MG PO TABS
25.0000 mg | ORAL_TABLET | Freq: Every day | ORAL | Status: DC
  Administered 2020-05-17 – 2020-05-18 (×2): 25 mg via ORAL
  Filled 2020-05-17 (×2): qty 1

## 2020-05-17 MED ORDER — FUROSEMIDE 10 MG/ML IJ SOLN
20.0000 mg | Freq: Two times a day (BID) | INTRAMUSCULAR | Status: AC
  Administered 2020-05-17 (×2): 20 mg via INTRAVENOUS
  Filled 2020-05-17 (×2): qty 2

## 2020-05-17 MED ORDER — FUROSEMIDE 10 MG/ML IJ SOLN: 40.0000 mg | Freq: Two times a day (BID) | INTRAMUSCULAR | Status: DC

## 2020-05-17 NOTE — Progress Notes (Signed)
[facility] PROGRESS NOTE    Progress Note  [name]  MPN:[id number] DOB: 04-03-1941 DOA: 05/11/2020 PCP: Reynold Bowen, MD     Brief Narrative:   [name] is an 79 y.o. female past medical history significant of essential hypertension hypothyroidism chronic back pain presents to the ED for fever chills malaise, initially she was hypotensive and hypoxic in the ED requiring 2 L of oxygen.  CT angio of the chest abdomen and pelvis shows masslike opacity in the posterior segment of the right upper lobe with surrounding groundglass attenuation suspicious for pneumonia. Patient was started started on antibiotics and fluid resuscitated. Right foot x-ray showed a very small fracture of indeterminate age.  Assessment/Plan:   Acute respiratory failure with hypoxia due to right lower lobe pneumonia: Respiratory virus panel influenza and Covid are negative. She was started empirically on IV Rocephin and azithromycin. She still remains hypoxic requiring 2 L and repeat a CT scan was done that showed resolution of masslike opacity. She will need to continue to be empiric antibiotics for a total of 7 days, last day on 05/18/2020.  Hyponatremia: Likely due to hypovolemia she was started on IV fluid and then there was a concern for fluid overload she was given IV Lasix. hypoNatremia now resolved.  Fluid overloaded moderate bilateral pleural effusion: She was given 2 doses of IV Lasix on 05/16/2020. She has positive pedal jugular reflux on physical exam and I cannot appreciate lung sounds in the bilateral lower lobes. Repeat a chest x-ray in the morning we will give her another round of IV Lasix.  Memory loss: Continue to monitor closely.  Hypertension/with a history of essential hypertension: Held Lasix Norvasc Cozaar metoprolol as blood pressure is low. Restart metoprolol and Cozaar  Acute kidney injury: Likely prerenal azotemia resolved with IV fluid  hydration.  Hypothyroidism: Continue Synthroid.  Anxiety with depression: Continue Wellbutrin Cymbalta Ativan Elavil.  GERD: Continue PPI.  Cuboid fracture of indeterminate age: Was evaluated by Ortho and recommended she can bear weight.   DVT prophylaxis: lovenox Family Communication:Daughter Status is: Inpatient  Remains inpatient appropriate because:Hemodynamically unstable   Dispo: The patient is from: Home              Anticipated d/c is to: Home              Anticipated d/c date is: 1 day              Patient currently is not medically stable to d/c.        Code Status:     Code Status Orders  (From admission, onward)         Start     Ordered   05/11/20 2258  Do not attempt resuscitation (DNR)  Continuous       Question Answer Comment  In the event of cardiac or respiratory ARREST Do not call a "code blue"   In the event of cardiac or respiratory ARREST Do not perform Intubation, CPR, defibrillation or ACLS   In the event of cardiac or respiratory ARREST Use medication by any route, position, wound care, and other measures to relive pain and suffering. May use oxygen, suction and manual treatment of airway obstruction as needed for comfort.      05/11/20 2259        Code Status History    Date Active Date Inactive Code Status Order ID Comments User Context   01/09/2012 2342 01/11/2012 1140 Full Code [id number]  Wendee Beavers  Sheree, RN Inpatient   Advance Care Planning Activity    Advance Directive Documentation     Most Recent Value  Type of Advance Directive Healthcare Power of Attorney, Living will  Pre-existing out of facility DNR order (yellow form or pink MOST form) --  "MOST" Form in Place? --        IV Access:    Peripheral IV   Procedures and diagnostic studies:   CT CHEST W CONTRAST  Result Date: 05/16/2020 CLINICAL DATA:  Follow-up pneumonia EXAM: CT CHEST WITH CONTRAST TECHNIQUE: Multidetector CT imaging of the chest was  performed during intravenous contrast administration. CONTRAST:  126mL OMNIPAQUE IOHEXOL 300 MG/ML  SOLN COMPARISON:  CT 05/11/2020 FINDINGS: Cardiovascular: Heart size is upper limits of unchanged. No pericardial effusion. Thoracic aorta is normal in course and caliber. Common origin of the brachiocephalic and left common carotid arteries, an anatomic variant. Scattered coronary artery calcification. Main pulmonary trunk is nondilated. Mediastinum/Nodes: No axillary, mediastinal, or hilar lymphadenopathy. Stable appearance of the thyroid gland. Trachea is within normal limits. Small hiatal hernia. Esophagus otherwise within normal limits. Lungs/Pleura: Partial resolution of the previously seen masslike opacity within the right perihilar region. A small amount of residual consolidation and surrounding ground-glass opacity persist at this site. Interval development of patchy areas of ground-glass opacity within the bilateral upper lobes, right worse than left. Small to moderate-sized bilateral pleural effusions with associated compressive atelectasis, increased from prior. No pneumothorax. Upper Abdomen: No acute abnormality. Musculoskeletal: No new or acute osseous findings. 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 IMPRESSION: 1. Partial resolution of the previously seen mass-like opacity within the right perihilar region. A small amount of residual consolidation and surrounding ground-glass opacity persist at this site. Continued follow-up to resolution is recommended. 2. Interval development of patchy areas of ground-glass opacity within the bilateral upper lobes, right worse than left, which may represent an infectious or inflammatory process. 3. Small to moderate-sized bilateral pleural effusions with associated compressive atelectasis, increased from prior. Electronically Signed   By: Davina Poke D.O.   On: 05/16/2020 09:39     Medical Consultants:     None.  Anti-Infectives:   Continue IV Rocephin and azithromycin  Subjective:    [name] she relates her breathing is better and feels much better than yesterday.  Objective:    Vitals:   05/16/20 1132 05/16/20 2018 05/17/20 0106 05/17/20 0533  BP:  (!) 148/75  (!) 155/90  Pulse:  92  91  Resp:  18  18  Temp:  98.8 F (37.1 C)  98.6 F (37 C)  TempSrc:  Oral    SpO2: 91% 94%  93%  Weight:   67.3 kg   Height:       SpO2: 93 % O2 Flow Rate (L/min): 1 L/min   Intake/Output Summary (Last 24 hours) at 05/17/2020 0929 Last data filed at 05/17/2020 0640 Gross per 24 hour  Intake 1160 ml  Output 4300 ml  Net -3140 ml   Filed Weights   05/15/20 0453 05/16/20 0342 05/17/20 0106  Weight: 70.9 kg 69 kg 67.3 kg    Exam: General exam: In no acute distress. Respiratory system: Good air movement with no auscultated sounds at the bases Cardiovascular system: S1 & S2 heard, RRR. +JVD Gastrointestinal system: Abdomen is nondistended, soft and nontender.  Central nervous system: Alert and oriented. No focal neurological deficits. Extremities: No pedal edema. Skin: No rashes, lesions or ulcers Psychiatry: Judgement and insight  appear normal. Mood & affect appropriate.    Data Reviewed:    Labs: Basic Metabolic Panel: Recent Labs  Lab 05/11/20 1203 05/11/20 1203 05/12/20 0325 05/12/20 0325 05/13/20 8185 05/13/20 0828 05/15/20 0404 05/16/20 0603  NA 130*  --  130*  --  134*  --  138 137  K 4.1   < > 3.9   < > 3.5   < > 3.6 3.7  CL 95*  --  100  --  102  --  106 101  CO2 25  --  22  --  22  --  24 27  GLUCOSE 175*  --  102*  --  124*  --  103* 106*  BUN 18  --  9  --  <5*  --  6* 6*  CREATININE 1.14*  --  0.61  --  0.58  --  0.48 0.48  CALCIUM 8.8*  --  8.2*  --  8.4*  --  8.4* 8.6*   < > = values in this interval not displayed.   GFR Estimated Creatinine Clearance: 51.3 mL/min (by C-G formula based on SCr of 0.48 mg/dL). Liver Function  Tests: Recent Labs  Lab 05/11/20 1203  AST 22  ALT 15  ALKPHOS 50  BILITOT 0.8  PROT 6.3*  ALBUMIN 3.4*   No results for input(s): LIPASE, AMYLASE in the last 168 hours. No results for input(s): AMMONIA in the last 168 hours. Coagulation profile No results for input(s): INR, PROTIME in the last 168 hours. COVID-19 Labs  No results for input(s): DDIMER, FERRITIN, LDH, CRP in the last 72 hours.  Lab Results  Component Value Date   [place] NEGATIVE 05/11/2020    CBC: Recent Labs  Lab 05/11/20 1203 05/12/20 0325  WBC 8.0 8.3  HGB 10.7* 10.5*  HCT 32.5* 31.8*  MCV 94.2 96.4  PLT 233 227   Cardiac Enzymes: No results for input(s): CKTOTAL, CKMB, CKMBINDEX, TROPONINI in the last 168 hours. BNP (last 3 results) No results for input(s): PROBNP in the last 8760 hours. CBG: No results for input(s): GLUCAP in the last 168 hours. D-Dimer: No results for input(s): DDIMER in the last 72 hours. Hgb A1c: No results for input(s): HGBA1C in the last 72 hours. Lipid Profile: No results for input(s): CHOL, HDL, LDLCALC, TRIG, CHOLHDL, LDLDIRECT in the last 72 hours. Thyroid function studies: No results for input(s): TSH, T4TOTAL, T3FREE, THYROIDAB in the last 72 hours.  Invalid input(s): FREET3 Anemia work up: No results for input(s): VITAMINB12, FOLATE, FERRITIN, TIBC, IRON, RETICCTPCT in the last 72 hours. Sepsis Labs: Recent Labs  Lab 05/11/20 1203 05/11/20 1800 05/12/20 0325  WBC 8.0  --  8.3  LATICACIDVEN  --  1.3  --    Microbiology Recent Results (from the past 240 hour(s))  Respiratory Panel by RT PCR (Flu A&B, Covid) - Nasopharyngeal Swab     Status: None   Collection Time: 05/11/20 12:08 PM   Specimen: Nasopharyngeal Swab  Result Value Ref Range Status   SARS Coronavirus 2 by RT PCR NEGATIVE NEGATIVE Final    Comment: (NOTE) SARS-CoV-2 target nucleic acids are NOT DETECTED.  The SARS-CoV-2 RNA is generally detectable in upper respiratoy specimens  during the acute phase of infection. The lowest concentration of SARS-CoV-2 viral copies this assay can detect is 131 copies/mL. A negative result does not preclude SARS-Cov-2 infection and should not be used as the sole basis for treatment or other patient management decisions. A negative result may occur with  improper specimen collection/handling, submission of specimen other than nasopharyngeal swab, presence of viral mutation(s) within the areas targeted by this assay, and inadequate number of viral copies (<131 copies/mL). A negative result must be combined with clinical observations, patient history, and epidemiological information. The expected result is Negative.  Fact Sheet for Patients:  PinkCheek.be  Fact Sheet for Healthcare Providers:  GravelBags.it  This test is no t yet approved or cleared by the [place] FDA and  has been authorized for detection and/or diagnosis of SARS-CoV-2 by FDA under an Emergency Use Authorization (EUA). This EUA will remain  in effect (meaning this test can be used) for the duration of the COVID-19 declaration under Section 564(b)(1) of the Act, 21 U.S.C. section 360bbb-3(b)(1), unless the authorization is terminated or revoked sooner.     Influenza A by PCR NEGATIVE NEGATIVE Final   Influenza B by PCR NEGATIVE NEGATIVE Final    Comment: (NOTE) The Xpert Xpress SARS-CoV-2/FLU/RSV assay is intended as an aid in  the diagnosis of influenza from Nasopharyngeal swab specimens and  should not be used as a sole basis for treatment. Nasal washings and  aspirates are unacceptable for Xpert Xpress SARS-CoV-2/FLU/RSV  testing.  Fact Sheet for Patients: PinkCheek.be  Fact Sheet for Healthcare Providers: GravelBags.it  This test is not yet approved or cleared by the [place] FDA and  has been authorized for detection and/or  diagnosis of SARS-CoV-2 by  FDA under an Emergency Use Authorization (EUA). This EUA will remain  in effect (meaning this test can be used) for the duration of the  Covid-19 declaration under Section 564(b)(1) of the Act, 21  U.S.C. section 360bbb-3(b)(1), unless the authorization is  terminated or revoked. Performed at [facility], [place] [address], [place], [place] [address]   Blood culture (routine x 2)     Status: None   Collection Time: 05/11/20 10:00 PM   Specimen: BLOOD RIGHT FOREARM  Result Value Ref Range Status   Specimen Description BLOOD RIGHT FOREARM  Final   Special Requests   Final    BOTTLES DRAWN AEROBIC AND ANAEROBIC Blood Culture results may not be optimal due to an inadequate volume of blood received in culture bottles   Culture   Final    NO GROWTH 5 DAYS Performed at [facility], [place] [address] [place], [place] [address]    Report Status 05/16/2020 FINAL  Final  Blood culture (routine x 2)     Status: None   Collection Time: 05/11/20 10:10 PM   Specimen: BLOOD  Result Value Ref Range Status   Specimen Description BLOOD RIGHT ANTECUBITAL  Final   Special Requests   Final    BOTTLES DRAWN AEROBIC AND ANAEROBIC Blood Culture adequate volume   Culture   Final    NO GROWTH 5 DAYS Performed at [facility], [place] [address], [place], [place] [address]    Report Status 05/16/2020 FINAL  Final     Medications:   . ALPRAZolam  1 mg Oral QHS  . amitriptyline  25 mg Oral QHS  . azithromycin  500 mg Oral QHS  . buPROPion  75 mg Oral q morning - 10a  . cycloSPORINE  1 drop Both Eyes BID  . DULoxetine  90 mg Oral QHS  . enoxaparin (LOVENOX) injection  40 mg Subcutaneous QHS  . feeding supplement (ENSURE ENLIVE)  237 mL Oral TID BM  . furosemide  40 mg Intravenous BID  . guaiFENesin  600  mg Oral BID  . levothyroxine  75 mcg Oral Daily  . melatonin  3 mg Oral QHS  . pantoprazole  40 mg Oral Daily   Continuous Infusions: . sodium chloride  1,000 mL (05/15/20 2125)      LOS: 6 days   Charlynne Cousins  [facility]  05/17/2020, 9:29 AM

## 2020-05-17 NOTE — Progress Notes (Signed)
  Echocardiogram 2D Echocardiogram has been performed.  [name] 05/17/2020, 10:52 AM

## 2020-05-18 ENCOUNTER — Inpatient Hospital Stay (HOSPITAL_COMMUNITY): Payer: Medicare Other

## 2020-05-18 DIAGNOSIS — J9601 Acute respiratory failure with hypoxia: Secondary | ICD-10-CM | POA: Diagnosis not present

## 2020-05-18 DIAGNOSIS — J189 Pneumonia, unspecified organism: Secondary | ICD-10-CM | POA: Diagnosis not present

## 2020-05-18 DIAGNOSIS — F418 Other specified anxiety disorders: Secondary | ICD-10-CM | POA: Diagnosis not present

## 2020-05-18 DIAGNOSIS — I10 Essential (primary) hypertension: Secondary | ICD-10-CM | POA: Diagnosis not present

## 2020-05-18 NOTE — TOC Transition Note (Signed)
Transition of Care [facility]) - CM/SW Discharge Note   Patient Details  Name: [name] MRN: [id number] Date of Birth: 08/26/1940  Transition of Care [facility]) CM/SW Contact:  Zenon Mayo, RN Phone Number: 05/18/2020, 10:00 AM   Clinical Narrative:    Patient is for dc today, NCM spoke with patient, offered chioce, she does not have a preference for agency for HHPT.  Also NCM made referral to [facility] for 3 n 1 and rolling walker. They will bring up to room.  NCM made referral to [facility] with Alvis Lemmings, he is able to take for HHPT.   Final next level of care: [place] Barriers to Discharge: No Barriers Identified   Patient Goals and CMS Choice Patient states their goals for this hospitalization and ongoing recovery are:: get better CMS Medicare.gov Compare Post Acute Care list provided to:: Patient Choice offered to / list presented to : Patient  Discharge Placement                       Discharge Plan and Services                DME Arranged: 3-N-1, Walker rolling DME Agency: NA Date DME Agency Contacted: 05/18/20 Time DME Agency Contacted: [phone] Representative spoke with at DME Agency: [place]: PT [place]: [place] Date [place]: 05/18/20 Time [place]: 1000 Representative spoke with at [place]: [place] ([place]) Interventions     Readmission Risk Interventions No flowsheet data found.

## 2020-05-18 NOTE — Progress Notes (Signed)
PT Cancellation Note  Patient Details Name: [name] MRN: [id number] DOB: 30-Jun-1941   Cancelled Treatment:    Reason Eval/Treat Not Completed: Other (comment).  Declined PT today as she is leaving shortly and states getting home will be enough workout.  Follow up with HHPT.   Ramond Dial 05/18/2020, 10:37 AM   Mee Hives, PT MS Acute Rehab Dept. Number: [place] and [place]

## 2020-05-18 NOTE — TOC Transition Note (Addendum)
Transition of Care [facility]) - CM/SW Discharge Note   Patient Details  Name: [name] MRN: [id number] Date of Birth: 1941-08-03  Transition of Care [facility]) CM/SW Contact:  Zenon Mayo, RN Phone Number: 05/18/2020, 9:21 AM   Clinical Narrative:    Patient is for dc today, NCM spoke with patient, offered chioce, she does not have a preference for agency for HHPT. NCM made referral to [facility] with [facility].  Awaiting call back. Also NCM made referral to [facility] for 3 n 1 and rolling walker. They will bring up to room. Helene Kelp state they are unable to take patient , NCM made referral to [facility] with Alvis Lemmings, he is able to take for HHPT.     Final next level of care: [place] Barriers to Discharge: No Barriers Identified   Patient Goals and CMS Choice Patient states their goals for this hospitalization and ongoing recovery are:: get better CMS Medicare.gov Compare Post Acute Care list provided to:: Patient Choice offered to / list presented to : Patient  Discharge Placement                       Discharge Plan and Services                DME Arranged: 3-N-1, Walker rolling DME Agency: [facility] Date DME Agency Contacted: 05/18/20 Time DME Agency Contacted: [phone] Representative spoke with at DME Agency: [place]: PT [place]: [facility] [employer] Date [place]: 05/18/20 Time [place]: [phone] Representative spoke with at [place]: [place] ([place]) Interventions     Readmission Risk Interventions No flowsheet data found.

## 2020-05-18 NOTE — Progress Notes (Signed)
D/C instructions given and reviewed. No questions at this time but encouraged to call with any concerns. IV's removed, tolerated well.

## 2020-05-18 NOTE — Discharge Summary (Signed)
Physician Discharge Summary  [name] UUV:[id number] DOB: [birth year] DOA: 05/11/2020  PCP: Reynold Bowen, MD  Admit date: 05/11/2020 Discharge date: 05/18/2020  Admitted From: Home Disposition:  Home  Recommendations for Outpatient Follow-up:  1. Follow up with PCP in 1-2 weeks 2. Please obtain BMP/CBC in one week   Home Health:Yes Equipment/Devices:None  Discharge Condition:Stable CODE STATUS:Full Diet recommendation: Heart Healthy  Brief/Interim Summary: 79 y.o. female past medical history significant of essential hypertension hypothyroidism chronic back pain presents to the ED for fever chills malaise, initially she was hypotensive and hypoxic in the ED requiring 2 L of oxygen.  CT angio of the chest abdomen and pelvis shows masslike opacity in the posterior segment of the right upper lobe with surrounding groundglass attenuation suspicious for pneumonia.  Discharge Diagnoses:  Principal Problem:   Acute respiratory failure with hypoxia (HCC) Active Problems:   Memory loss or impairment   HTN (hypertension), benign   Hypothyroidism   Right upper lobe pneumonia   Hyponatremia   Anxiety with depression Acute respiratory failure with hypoxia due to right lower lobe pneumonia: She was started empirically on admission on IV antibiotics respiratory panel for influenza and COVID-19 were negative. She was initially started on oxygen supplementation and she was weaned to room air by the time of discharge. Disease repeat a CT scan was done that showed no masslike opacity. She completed her course of IV antibiotics in house. Sepsis has been ruled out she was never septic, she was not septic on admission  Hypervolemic hyponatremia: Resolved with IV Lasix.  Fluid overload/moderate bilateral pleural effusion: She was started on IV Lasix she had a total of 72 hours of IV Lasix her effusions resolved. She became euvolemic repeat a chest x-ray showed resolution of her  effusions. She will not go home on Lasix a 2D echo was done that showed a preserved EF and no diastolic heart failure. She did not have acute diastolic heart failure  Memory loss: Continue to monitor closely and evaluate as an outpatient.  Essential hypertension: No changes made to her medication they were resumed as an outpatient.  Acute kidney injury: Initially she was mildly hypotensive and hypovolemic she was aggressively fluid hydrated and this resolved.  Anxiety/depression: No changes made to her medication.  Hypothyroidism: Continue Synthroid.     Discharge Instructions  Discharge Instructions    Diet - low sodium heart healthy   Complete by: As directed    Increase activity slowly   Complete by: As directed      Allergies as of 05/18/2020      Reactions   Morphine And Related Itching, Rash   Given in IV.      Medication List    TAKE these medications   acetaminophen 325 MG tablet Commonly known as: TYLENOL Take 650 mg by mouth every 6 (six) hours as needed for headache.   ALPRAZolam 0.5 MG tablet Commonly known as: XANAX Take 1 tablet (0.5 mg total) by mouth 2 (two) times daily as needed. for anxiety What changed:   how much to take  when to take this  additional instructions   amitriptyline 25 MG tablet Commonly known as: ELAVIL TAKE 1 TABLET BY MOUTH EVERY DAY AT BEDTIME   amLODipine 5 MG tablet Commonly known as: NORVASC Take 1 tablet (5 mg total) by mouth daily.   buPROPion 75 MG tablet Commonly known as: WELLBUTRIN Take 75 mg by mouth every morning.   calcium-vitamin D 500-200 MG-UNIT tablet Commonly known as: OSCAL  WITH D Take 1 tablet by mouth daily.   cholecalciferol 1000 units tablet Commonly known as: VITAMIN D Take 1,000 Units by mouth daily.   DRY EYES OP Apply 1 drop to eye 3 (three) times daily as needed (for dry eyes).   DULoxetine 30 MG capsule Commonly known as: CYMBALTA Take 30 mg by mouth See admin instructions.  Take 1 capsule (30 mg) combine with 1 capsule (60 mg) by mouth daily   DULoxetine 60 MG capsule Commonly known as: CYMBALTA Take 60 mg by mouth See admin instructions. Take 1 capsule (60 mg) combine with 1 capsule (30 mg) by mouth daily   furosemide 20 MG tablet Commonly known as: LASIX Take 1 tablet (20 mg total) by mouth daily. NEED ANNUAL VISIT FOR FURTHER REFILLS What changed: additional instructions   levothyroxine 75 MCG tablet Commonly known as: SYNTHROID Take 75 mcg by mouth daily.   losartan 100 MG tablet Commonly known as: Cozaar Take 1 tablet (100 mg total) by mouth daily. NEED ANNUAL VISIT FOR FURTHER REFILLS   melatonin 3 MG Tabs tablet Take 3 mg by mouth at bedtime.   meloxicam 7.5 MG tablet Commonly known as: MOBIC Take 7.5 mg by mouth daily.   metoprolol succinate 50 MG 24 hr tablet Commonly known as: TOPROL-XL Take 1 tablet (50 mg total) by mouth daily. Take with or immediately following a meal. NEED ANNUAL VISIT FOR FURTHER REFILLS   multivitamin with minerals tablet Take 1 tablet by mouth daily.   omeprazole 40 MG capsule Commonly known as: PRILOSEC Take 1 capsule (40 mg total) by mouth daily. NEED ANNUAL VISIT FOR FURTHER REFILLS What changed: additional instructions   polyethylene glycol powder 17 GM/SCOOP powder Commonly known as: GLYCOLAX/MIRALAX DISSOLVE 17 GRAMS INTO LIQUID AND DRINK BY MOUTH EVERY DAY What changed: See the new instructions.   Restasis 0.05 % ophthalmic emulsion Generic drug: cycloSPORINE Place 1 drop into both eyes 2 (two) times daily.   traMADol 50 MG tablet Commonly known as: ULTRAM TAKE 1 TABLET BY MOUTH EVERY 8 HOURS AS NEEDED FOR MODERATE TO SEVERE PAIN. MAY TAKE 1 ADDITIONAL TABLET DAILY AS NEEDED FOR SEVERE PAIN What changed: See the new instructions.       Allergies  Allergen Reactions  . Morphine And Related Itching and Rash    Given in IV.    Consultations:  None   Procedures/Studies: CT Head Wo  Contrast  Result Date: 05/11/2020 CLINICAL DATA:  Head trauma.  Multiple falls.  Head injury. EXAM: CT HEAD WITHOUT CONTRAST TECHNIQUE: Contiguous axial images were obtained from the base of the skull through the vertex without intravenous contrast. COMPARISON:  04/24/2020 FINDINGS: Brain: Stable degree of atrophy and chronic small vessel ischemia. No intracranial hemorrhage, mass effect, or midline shift. No hydrocephalus. The basilar cisterns are patent. No evidence of territorial infarct or acute ischemia. No extra-axial or intracranial fluid collection. Vascular: No hyperdense vessel. Air in the right cavernous sinus is typically related to IV catheter placement. Skull: No acute fracture. Stable small incidental bilateral skull exostosis. Sinuses/Orbits: Paranasal sinuses and mastoid air cells are clear. The visualized orbits are unremarkable. Bilateral lens resection. Other: None. IMPRESSION: 1. No acute intracranial abnormality. No skull fracture. 2. Stable atrophy and chronic small vessel ischemia. Electronically Signed   By: Keith Rake M.D.   On: 05/11/2020 20:10   CT HEAD WO CONTRAST  Result Date: 04/24/2020 CLINICAL DATA:  Constant headaches for 3 weeks. History of hypertension and partial nephrectomy. EXAM: CT HEAD WITHOUT  CONTRAST TECHNIQUE: Contiguous axial images were obtained from the base of the skull through the vertex without intravenous contrast. COMPARISON:  09/30/2018 FINDINGS: Brain: No evidence of acute infarction, hemorrhage, hydrocephalus, extra-axial collection or mass lesion/mass effect. There is prominence of the sulci compatible with age related parenchymal atrophy. Mild low attenuation within the subcortical and periventricular white matter consistent with chronic small vessel ischemic change. Vascular: No hyperdense vessel or unexpected calcification. Skull: Normal. Negative for fracture or focal lesion. Sinuses/Orbits: No acute finding. Other: None. IMPRESSION: 1. No acute  intracranial abnormalities. 2. Chronic small vessel ischemic change and parenchymal atrophy. Electronically Signed   By: Kerby Moors M.D.   On: 04/24/2020 12:00   CT CHEST W CONTRAST  Result Date: 05/16/2020 CLINICAL DATA:  Follow-up pneumonia EXAM: CT CHEST WITH CONTRAST TECHNIQUE: Multidetector CT imaging of the chest was performed during intravenous contrast administration. CONTRAST:  163mL OMNIPAQUE IOHEXOL 300 MG/ML  SOLN COMPARISON:  CT 05/11/2020 FINDINGS: Cardiovascular: Heart size is upper limits of unchanged. No pericardial effusion. Thoracic aorta is normal in course and caliber. Common origin of the brachiocephalic and left common carotid arteries, an anatomic variant. Scattered coronary artery calcification. Main pulmonary trunk is nondilated. Mediastinum/Nodes: No axillary, mediastinal, or hilar lymphadenopathy. Stable appearance of the thyroid gland. Trachea is within normal limits. Small hiatal hernia. Esophagus otherwise within normal limits. Lungs/Pleura: Partial resolution of the previously seen masslike opacity within the right perihilar region. A small amount of residual consolidation and surrounding ground-glass opacity persist at this site. Interval development of patchy areas of ground-glass opacity within the bilateral upper lobes, right worse than left. Small to moderate-sized bilateral pleural effusions with associated compressive atelectasis, increased from prior. No pneumothorax. Upper Abdomen: No acute abnormality. Musculoskeletal: No new or acute osseous findings. 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 IMPRESSION: 1. Partial resolution of the previously seen mass-like opacity within the right perihilar region. A small amount of residual consolidation and surrounding ground-glass opacity persist at this site. Continued follow-up to resolution is recommended. 2. Interval development of patchy areas of ground-glass opacity  within the bilateral upper lobes, right worse than left, which may represent an infectious or inflammatory process. 3. Small to moderate-sized bilateral pleural effusions with associated compressive atelectasis, increased from prior. Electronically Signed   By: Davina Poke D.O.   On: 05/16/2020 09:39   CT Angio Chest PE W and/or Wo Contrast  Result Date: 05/11/2020 CLINICAL DATA:  Abnormal chest radiograph, fever, abdominal pain, left lower quadrant pain EXAM: CT ANGIOGRAPHY CHEST CT ABDOMEN AND PELVIS WITH CONTRAST TECHNIQUE: Multidetector CT imaging of the chest was performed using the standard protocol during bolus administration of intravenous contrast. Multiplanar CT image reconstructions and MIPs were obtained to evaluate the vascular anatomy. Multidetector CT imaging of the abdomen and pelvis was performed using the standard protocol during bolus administration of intravenous contrast. CONTRAST:  122mL OMNIPAQUE IOHEXOL 350 MG/ML SOLN COMPARISON:  Chest radiograph 05/11/2020, CT abdomen pelvis 04/07/2018 thyroid ultrasound 01/16/2019, MR abdomen 10/08/2011 FINDINGS: CTA CHEST FINDINGS Cardiovascular: Satisfactory opacification the pulmonary arteries to the segmental level. No pulmonary artery filling defects are identified. Some mild dilatation of the left and right main pulmonary arteries, can reflect a degree of pulmonary artery hypertension. Cardiac size is upper limits normal. No pericardial effusion. Few scattered coronary artery calcifications. The aortic root is suboptimally assessed given cardiac pulsation artifact. Atherosclerotic plaque within the normal caliber aorta. No acute luminal abnormality of the imaged aorta. No periaortic stranding or  hemorrhage. Shared origin of the brachiocephalic and left common carotid arteries. Proximal great vessels are otherwise unremarkable. Mediastinum/Nodes: No mediastinal fluid or gas. Few subcentimeter thyroid nodules in the right lobe of the gland  have previously been seen comparison ultrasound and warrant no further evaluation. No acute abnormality of the trachea or thoracic esophagus. Few scattered calcified mediastinal and hilar nodes are present, may reflect sequela of prior granulomatous disease. No worrisome enlarged mediastinal, hilar or axillary adenopathy. Lungs/Pleura: There is masslike consolidative opacity situated in the posterior segment of the right upper lobe with some surrounding areas of ground-glass attenuation and extension across the minor fissure into the medial segment right middle lobe as well (4/67). While several of the airways truncated in this region there is no significant narrowing of vessels or proximal hilar airways. Some additional patchy opacity and ground-glass is present in the superior segment right lower lobe separate from this larger focal consolidation (4/71). Separate 4 mm nodule present in the left lung apex 4/23). Some mild pleural thickening is seen along both the major and minor fissures of the right lung. Mild interlobular septal thickening is present as well. A small right pleural effusion is present as well with dependent atelectasis. Musculoskeletal: Cervical spondylitic changes including some possible degenerative bony fusion C6-7. Additional mild-to-moderate spondylitic changes in the thoracic spine. No acute or worrisome osseous lesion. 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 Review of the MIP images confirms the above findings. CT ABDOMEN and PELVIS FINDINGS Hepatobiliary: No worrisome focal liver lesions. Smooth liver surface contour. Normal hepatic attenuation. Prominent fold at the gallbladder body. Gallbladder distention is within physiologic normal. No visible calcified gallstones. No pericholecystic inflammation. No biliary ductal dilatation. Pancreas: Moderate pancreatic atrophy. Otherwise unremarkable. No pancreatic ductal  dilatation or surrounding inflammatory changes. Spleen: Normal in size. No concerning splenic lesions. Adrenals/Urinary Tract: Normal adrenal glands. Kidneys enhance and excrete symmetrically. Stable bandlike region of scarring in the interpolar left kidney. No concerning renal mass. Bilateral extrarenal pelves are similar to comparison. No frank hydronephrosis or urolithiasis. Urinary bladder is unremarkable. Stomach/Bowel: Small sliding-type hiatal hernia. Distal stomach and duodenum are unremarkable. No small bowel thickening or dilatation. A normal appendix is visualized. Moderate colonic stool burden. No colonic dilatation or wall thickening. No evidence of bowel obstruction. Vascular/Lymphatic: Atherosclerotic calcifications within the abdominal aorta and branch vessels. No aneurysm or ectasia. No enlarged abdominopelvic lymph nodes. Reproductive: Uterus is surgically absent. No concerning adnexal lesions. Other: No abdominopelvic free fluid or free gas. No bowel containing hernias. Musculoskeletal: Stepwise retrolisthesis L1-L4. No associated spondylolysis. Prior L4-5 posterior decompression, fusion and interbody spacer placement. Some mild degenerative changes seen at the SI joints, left greater than right, and bilateral hips. No acute or worrisome osseous lesions. Review of the MIP images confirms the above findings. IMPRESSION: 1. No evidence of acute pulmonary embolism. 2. Masslike opacity predominantly in the posterior segment of the right upper lobe with some surrounding areas of ground-glass attenuation and extension across the minor fissure into the medial segment right middle lobe and superior segment right lower lobe. Given air bronchograms, additional patchy opacity and ground-glass in the right lower lobe and the setting of fever, appearance is most concerning for pneumonia though an underlying malignancy cannot be excluded. Recommend follow-up to resolution. 3. Suspect some mild interstitial  edematous features as well with interlobular septal thickening and vascular redistribution. 4. Small right pleural effusion. 5. No acute findings in the abdomen or pelvis. 6. Moderate colonic stool  burden. Correlate for features of constipation. 7. 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 Consider referral for outpatient breast imaging if not performed recently. 8. Aortic Atherosclerosis (ICD10-I70.0). 9. Coronary artery calcifications are present. Please note that the presence of coronary artery calcium documents the presence of coronary artery disease, the severity of this disease and any potential stenosis cannot be assessed on this non-gated CT examination. Electronically Signed   By: Lovena Le M.D.   On: 05/11/2020 20:28   CT ABDOMEN PELVIS W CONTRAST  Result Date: 05/11/2020 CLINICAL DATA:  Abnormal chest radiograph, fever, abdominal pain, left lower quadrant pain EXAM: CT ANGIOGRAPHY CHEST CT ABDOMEN AND PELVIS WITH CONTRAST TECHNIQUE: Multidetector CT imaging of the chest was performed using the standard protocol during bolus administration of intravenous contrast. Multiplanar CT image reconstructions and MIPs were obtained to evaluate the vascular anatomy. Multidetector CT imaging of the abdomen and pelvis was performed using the standard protocol during bolus administration of intravenous contrast. CONTRAST:  185mL OMNIPAQUE IOHEXOL 350 MG/ML SOLN COMPARISON:  Chest radiograph 05/11/2020, CT abdomen pelvis 04/07/2018 thyroid ultrasound 01/16/2019, MR abdomen 10/08/2011 FINDINGS: CTA CHEST FINDINGS Cardiovascular: Satisfactory opacification the pulmonary arteries to the segmental level. No pulmonary artery filling defects are identified. Some mild dilatation of the left and right main pulmonary arteries, can reflect a degree of pulmonary artery hypertension. Cardiac size is upper limits normal. No pericardial effusion. Few  scattered coronary artery calcifications. The aortic root is suboptimally assessed given cardiac pulsation artifact. Atherosclerotic plaque within the normal caliber aorta. No acute luminal abnormality of the imaged aorta. No periaortic stranding or hemorrhage. Shared origin of the brachiocephalic and left common carotid arteries. Proximal great vessels are otherwise unremarkable. Mediastinum/Nodes: No mediastinal fluid or gas. Few subcentimeter thyroid nodules in the right lobe of the gland have previously been seen comparison ultrasound and warrant no further evaluation. No acute abnormality of the trachea or thoracic esophagus. Few scattered calcified mediastinal and hilar nodes are present, may reflect sequela of prior granulomatous disease. No worrisome enlarged mediastinal, hilar or axillary adenopathy. Lungs/Pleura: There is masslike consolidative opacity situated in the posterior segment of the right upper lobe with some surrounding areas of ground-glass attenuation and extension across the minor fissure into the medial segment right middle lobe as well (4/67). While several of the airways truncated in this region there is no significant narrowing of vessels or proximal hilar airways. Some additional patchy opacity and ground-glass is present in the superior segment right lower lobe separate from this larger focal consolidation (4/71). Separate 4 mm nodule present in the left lung apex 4/23). Some mild pleural thickening is seen along both the major and minor fissures of the right lung. Mild interlobular septal thickening is present as well. A small right pleural effusion is present as well with dependent atelectasis. Musculoskeletal: Cervical spondylitic changes including some possible degenerative bony fusion C6-7. Additional mild-to-moderate spondylitic changes in the thoracic spine. No acute or worrisome osseous lesion. Bilateral breast prostheses with capsular calcifications and sites concerning for  intracapsular rupture bilaterally. Review of the MIP images confirms the above findings. CT ABDOMEN and PELVIS FINDINGS Hepatobiliary: No worrisome focal liver lesions. Smooth liver surface contour. Normal hepatic attenuation. Prominent fold at the gallbladder body. Gallbladder distention is within physiologic normal. No visible calcified gallstones. No pericholecystic inflammation. No biliary ductal dilatation. Pancreas: Moderate pancreatic atrophy. Otherwise unremarkable. No pancreatic ductal dilatation or surrounding inflammatory changes. Spleen: Normal in size. No concerning splenic lesions. Adrenals/Urinary Tract:  Normal adrenal glands. Kidneys enhance and excrete symmetrically. Stable bandlike region of scarring in the interpolar left kidney. No concerning renal mass. Bilateral extrarenal pelves are similar to comparison. No frank hydronephrosis or urolithiasis. Urinary bladder is unremarkable. Stomach/Bowel: Small sliding-type hiatal hernia. Distal stomach and duodenum are unremarkable. No small bowel thickening or dilatation. A normal appendix is visualized. Moderate colonic stool burden. No colonic dilatation or wall thickening. No evidence of bowel obstruction. Vascular/Lymphatic: Atherosclerotic calcifications within the abdominal aorta and branch vessels. No aneurysm or ectasia. No enlarged abdominopelvic lymph nodes. Reproductive: Uterus is surgically absent. No concerning adnexal lesions. Other: No abdominopelvic free fluid or free gas. No bowel containing hernias. Musculoskeletal: Stepwise retrolisthesis L1-L4. No associated spondylolysis. Prior L4-5 posterior decompression, fusion and interbody spacer placement. Some mild degenerative changes seen at the SI joints, left greater than right, and bilateral hips. No acute or worrisome osseous lesions. Review of the MIP images confirms the above findings. IMPRESSION: 1. No evidence of acute pulmonary embolism. 2. Masslike opacity predominantly in the  posterior segment of the right upper lobe with some surrounding areas of ground-glass attenuation and extension across the minor fissure into the medial segment right middle lobe and superior segment right lower lobe. Given air bronchograms, additional patchy opacity and ground-glass in the right lower lobe and the setting of fever, appearance is most concerning for pneumonia though an underlying malignancy cannot be excluded. Recommend follow-up to resolution. 3. Suspect some mild interstitial edematous features as well with interlobular septal thickening and vascular redistribution. 4. Small right pleural effusion. 5. No acute findings in the abdomen or pelvis. 6. Moderate colonic stool burden. Correlate for features of constipation. 7. 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 Consider referral for outpatient breast imaging if not performed recently. 8. Aortic Atherosclerosis (ICD10-I70.0). 9. Coronary artery calcifications are present. Please note that the presence of coronary artery calcium documents the presence of coronary artery disease, the severity of this disease and any potential stenosis cannot be assessed on this non-gated CT examination. Electronically Signed   By: Lovena Le M.D.   On: 05/11/2020 20:28   DG CHEST PORT 1 VIEW  Result Date: 05/18/2020 CLINICAL DATA:  Shortness of breath with fever and chills EXAM: PORTABLE CHEST 1 VIEW COMPARISON:  Chest CT May 16, 2020. Chest radiograph May 11, 2020 FINDINGS: Previously noted pleural effusions no longer evident. There is apparent atelectatic change in the right mid lower lung regions. There is also mild atelectatic change in the medial left base. No new opacity evident. Heart is upper normal in size with pulmonary vascularity normal. No adenopathy. There are calcified breast implants bilaterally. Lucent area with sclerotic periphery in the right humeral head  measuring 0.9 x 0.9 mm is stable. IMPRESSION: Interval clearing of airspace opacity and effusions. Areas of atelectatic change in the right mid and lower lung zones remained. Slight atelectasis medial left base. No new opacity evident. Stable cardiac silhouette. Electronically Signed   By: Lowella Grip III M.D.   On: 05/18/2020 08:13   DG Chest Portable 1 View  Result Date: 05/11/2020 CLINICAL DATA:  Weakness. EXAM: PORTABLE CHEST 1 VIEW COMPARISON:  January 14, 2017. FINDINGS: The heart size and mediastinal contours are within normal limits. No pneumothorax or pleural effusion is noted. Left lung is clear. Mild right infrahilar opacity is noted which may represent pneumonia, but neoplasm cannot be excluded. The visualized skeletal structures are unremarkable. IMPRESSION: Mild right infrahilar opacity is noted which may  represent pneumonia, but neoplasm cannot be excluded. CT scan of the chest with intravenous contrast is recommended for further evaluation. Electronically Signed   By: Marijo Conception M.D.   On: 05/11/2020 13:10   DG Foot Complete Right  Result Date: 05/11/2020 CLINICAL DATA:  Status post fall. EXAM: RIGHT FOOT COMPLETE - 3+ VIEW COMPARISON:  None. FINDINGS: A very small area of cortical irregularity is seen along the plantar aspect of the right cuboid bone. Adjacent 3 mm curvilinear cortical density is noted. Mild degenerative changes seen along the metatarsophalangeal articulation of the right great toe. An associated hallux valgus deformity is present. Mild dorsal soft tissue swelling is seen along the mid right foot. IMPRESSION: Very small fracture of indeterminate age along the plantar aspect of the right cuboid bone. Correlation with physical examination of this region is recommended to determine the presence or absence of point tenderness. Electronically Signed   By: Virgina Norfolk M.D.   On: 05/11/2020 18:37   ECHOCARDIOGRAM COMPLETE  Result Date: 05/17/2020    ECHOCARDIOGRAM  REPORT   Patient Name:   [name] Date of Exam: 05/17/2020 Medical Rec #:  [id number]       Height:       62.0 in Accession #:    [id number]      Weight:       148.3 lb Date of Birth:  July 20, 1941       BSA:          1.684 m Patient Age:    13 years        BP:           155/90 mmHg Patient Gender: F               HR:           88 bpm. 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 Risk Factors:Hypertension. Cancer. GERD.  Sonographer:    Jonelle Sidle Dance Referring Phys: [id number] [address]ard  1. Left ventricular ejection fraction, by estimation, is 60 to 65%. The left ventricle has normal function. The left ventricle has no regional wall motion abnormalities. Left ventricular diastolic parameters were normal.  2. Right ventricular systolic function is normal. The right ventricular size is normal.  3. Left atrial size was mildly dilated.  4. The mitral valve is normal in structure. Trivial mitral valve regurgitation. No evidence of mitral stenosis.  5. The aortic valve is tricuspid. Aortic valve regurgitation is not visualized. Mild to moderate aortic valve sclerosis/calcification is present, without any evidence of aortic stenosis.  6. The inferior vena cava is normal in size with greater than 50% respiratory variability, suggesting right atrial pressure of 3 mmHg. FINDINGS  Left Ventricle: Left ventricular ejection fraction, by estimation, is 60 to 65%. The left ventricle has normal function. The left ventricle has no regional wall motion abnormalities. The left ventricular internal cavity size was normal in size. There is  no left ventricular hypertrophy. Left ventricular diastolic parameters were normal. Right Ventricle: The right ventricular size is normal. No increase in right  ventricular wall thickness. Right ventricular systolic function is normal. Left Atrium: Left atrial size was mildly dilated. Right Atrium: Right atrial  size was normal in size. Pericardium: There is no evidence of pericardial effusion. Mitral Valve: The mitral valve is normal in structure. Trivial mitral valve regurgitation. No evidence of mitral valve stenosis. Tricuspid Valve: The tricuspid valve is normal in structure. Tricuspid valve regurgitation is not demonstrated. No evidence of tricuspid stenosis. Aortic Valve: The aortic valve is tricuspid. Aortic valve regurgitation is not visualized. Mild to moderate aortic valve sclerosis/calcification is present, without any evidence of aortic stenosis. Pulmonic Valve: The pulmonic valve was normal in structure. Pulmonic valve regurgitation is not visualized. No evidence of pulmonic stenosis. Aorta: The aortic root is normal in size and structure. Venous: The inferior vena cava is normal in size with greater than 50% respiratory variability, suggesting right atrial pressure of 3 mmHg. IAS/Shunts: No atrial level shunt detected by color flow Doppler.  LEFT VENTRICLE PLAX 2D LVIDd:         4.33 cm  Diastology LVIDs:         3.76 cm  LV e' medial:    4.03 cm/s LV PW:         1.26 cm  LV E/e' medial:  10.8 LV IVS:        0.93 cm  LV e' lateral:   5.11 cm/s LVOT diam:     1.80 cm  LV E/e' lateral: 8.6 LV SV:         46 LV SV Index:   28 LVOT Area:     2.54 cm  RIGHT VENTRICLE             IVC RV Basal diam:  2.51 cm     IVC diam: 1.19 cm RV S prime:     17.70 cm/s TAPSE (M-mode): 1.8 cm LEFT ATRIUM             Index       RIGHT ATRIUM           Index LA diam:        3.90 cm 2.32 cm/m  RA Area:     13.00 cm LA Vol (A2C):   36.1 ml 21.44 ml/m RA Volume:   32.40 ml  19.25 ml/m LA Vol (A4C):   63.2 ml 37.54 ml/m LA Biplane Vol: 48.6 ml 28.87 ml/m  AORTIC VALVE LVOT Vmax:   98.30 cm/s LVOT Vmean:  62.600 cm/s LVOT VTI:    0.182 m  AORTA Ao Root diam: 2.80 cm Ao Asc diam:   2.70 cm MITRAL VALVE MV Area (PHT): 2.60 cm    SHUNTS MV Decel Time: 292 msec    Systemic VTI:  0.18 m MV E velocity: 43.70 cm/s  Systemic Diam: 1.80 cm MV A velocity: 56.30 cm/s MV E/A ratio:  0.78 Jenkins Rouge MD Electronically signed by Jenkins Rouge MD Signature Date/Time: 05/17/2020/10:55:37 AM    Final     (Echo, Carotid, EGD, Colonoscopy, ERCP)    Subjective:   Discharge Exam: Vitals:   05/17/20 2014 05/18/20 0525  BP: (!) 157/80 (!) 135/55  Pulse: 80 68  Resp: 16 18  Temp: 98.7 F (37.1 C) 98 F (36.7 C)  SpO2: 94% 92%   Vitals:   05/17/20 0953 05/17/20 1827 05/17/20 2014 05/18/20 0525  BP: (!) 135/91 135/69 (!) 157/80 (!) 135/55  Pulse: 85 78 80 68  Resp:  16 16 18   Temp:  98.9 F (37.2 C) 98.7 F (37.1 C) 98 F (36.7 C)  TempSrc:  Oral Oral Oral  SpO2:  92% 94% 92%  Weight:  66.2 kg  Height:        General: Pt is alert, awake, not in acute distress Cardiovascular: RRR, S1/S2 +, no rubs, no gallops Respiratory: CTA bilaterally, no wheezing, no rhonchi Abdominal: Soft, NT, ND, bowel sounds + Extremities: no edema, no cyanosis    The results of significant diagnostics from this hospitalization (including imaging, microbiology, ancillary and laboratory) are listed below for reference.     Microbiology: Recent Results (from the past 240 hour(s))  Respiratory Panel by RT PCR (Flu A&B, Covid) - Nasopharyngeal Swab     Status: None   Collection Time: 05/11/20 12:08 PM   Specimen: Nasopharyngeal Swab  Result Value Ref Range Status   SARS Coronavirus 2 by RT PCR NEGATIVE NEGATIVE Final    Comment: (NOTE) SARS-CoV-2 target nucleic acids are NOT DETECTED.  The SARS-CoV-2 RNA is generally detectable in upper respiratoy specimens during the acute phase of infection. The lowest concentration of SARS-CoV-2 viral copies this assay can detect is 131 copies/mL. A negative result does not preclude SARS-Cov-2 infection and should not be used as the sole basis for  treatment or other patient management decisions. A negative result may occur with  improper specimen collection/handling, submission of specimen other than nasopharyngeal swab, presence of viral mutation(s) within the areas targeted by this assay, and inadequate number of viral copies (<131 copies/mL). A negative result must be combined with clinical observations, patient history, and epidemiological information. The expected result is Negative.  Fact Sheet for Patients:  PinkCheek.be  Fact Sheet for Healthcare Providers:  GravelBags.it  This test is no t yet approved or cleared by the [place] FDA and  has been authorized for detection and/or diagnosis of SARS-CoV-2 by FDA under an Emergency Use Authorization (EUA). This EUA will remain  in effect (meaning this test can be used) for the duration of the COVID-19 declaration under Section 564(b)(1) of the Act, 21 U.S.C. section 360bbb-3(b)(1), unless the authorization is terminated or revoked sooner.     Influenza A by PCR NEGATIVE NEGATIVE Final   Influenza B by PCR NEGATIVE NEGATIVE Final    Comment: (NOTE) The Xpert Xpress SARS-CoV-2/FLU/RSV assay is intended as an aid in  the diagnosis of influenza from Nasopharyngeal swab specimens and  should not be used as a sole basis for treatment. Nasal washings and  aspirates are unacceptable for Xpert Xpress SARS-CoV-2/FLU/RSV  testing.  Fact Sheet for Patients: PinkCheek.be  Fact Sheet for Healthcare Providers: GravelBags.it  This test is not yet approved or cleared by the [place] FDA and  has been authorized for detection and/or diagnosis of SARS-CoV-2 by  FDA under an Emergency Use Authorization (EUA). This EUA will remain  in effect (meaning this test can be used) for the duration of the  Covid-19 declaration under Section 564(b)(1) of the Act, 21   U.S.C. section 360bbb-3(b)(1), unless the authorization is  terminated or revoked. Performed at [facility], [place] [address] [place], [place] [address]   Blood culture (routine x 2)     Status: None   Collection Time: 05/11/20 10:00 PM   Specimen: BLOOD RIGHT FOREARM  Result Value Ref Range Status   Specimen Description BLOOD RIGHT FOREARM  Final   Special Requests   Final    BOTTLES DRAWN AEROBIC AND ANAEROBIC Blood Culture results may not be optimal due to an inadequate volume of blood received in culture bottles   Culture   Final    NO GROWTH 5 DAYS Performed at [facility], [place] [address], [place], [place] [address]    Report Status 05/16/2020 FINAL  Final  Blood culture (routine x 2)     Status: None   Collection Time: 05/11/20 10:10 PM   Specimen: BLOOD  Result Value Ref Range Status   Specimen Description BLOOD RIGHT ANTECUBITAL  Final   Special Requests   Final    BOTTLES DRAWN AEROBIC AND ANAEROBIC Blood Culture adequate volume   Culture   Final    NO GROWTH 5 DAYS Performed at [facility], [place] [address], [place], [place] [address]    Report Status 05/16/2020 FINAL  Final     Labs: BNP (last 3 results) No results for input(s): BNP in the last 8760 hours. Basic Metabolic Panel: Recent Labs  Lab 05/12/20 0325 05/13/20 0828 05/15/20 0404 05/16/20 0603 05/17/20 0921  NA 130* 134* 138 137 136  K 3.9 3.5 3.6 3.7 3.8  CL 100 102 106 101 97*  CO2 22 22 24 27 26   GLUCOSE 102* 124* 103* 106* 129*  BUN 9 <5* 6* 6* 8  CREATININE 0.61 0.58 0.48 0.48 0.60  CALCIUM 8.2* 8.4* 8.4* 8.6* 9.2   Liver Function Tests: Recent Labs  Lab 05/11/20 1203  AST 22  ALT 15  ALKPHOS 50  BILITOT 0.8  PROT 6.3*  ALBUMIN 3.4*   No results for input(s): LIPASE, AMYLASE in the last 168 hours. No results for input(s): AMMONIA in the last 168 hours. CBC: Recent Labs  Lab 05/11/20 1203 05/12/20 0325  WBC 8.0 8.3  HGB 10.7* 10.5*  HCT 32.5* 31.8*  MCV  94.2 96.4  PLT 233 227   Cardiac Enzymes: No results for input(s): CKTOTAL, CKMB, CKMBINDEX, TROPONINI in the last 168 hours. BNP: Invalid input(s): POCBNP CBG: No results for input(s): GLUCAP in the last 168 hours. D-Dimer No results for input(s): DDIMER in the last 72 hours. Hgb A1c No results for input(s): HGBA1C in the last 72 hours. Lipid Profile No results for input(s): CHOL, HDL, LDLCALC, TRIG, CHOLHDL, LDLDIRECT in the last 72 hours. Thyroid function studies No results for input(s): TSH, T4TOTAL, T3FREE, THYROIDAB in the last 72 hours.  Invalid input(s): FREET3 Anemia work up No results for input(s): VITAMINB12, FOLATE, FERRITIN, TIBC, IRON, RETICCTPCT in the last 72 hours. Urinalysis    Component Value Date/Time   COLORURINE YELLOW 05/11/2020 1925   APPEARANCEUR HAZY (A) 05/11/2020 1925   LABSPEC 1.011 05/11/2020 1925   PHURINE 6.0 05/11/2020 1925   GLUCOSEU NEGATIVE 05/11/2020 1925   GLUCOSEU NEGATIVE 05/06/2016 1708   HGBUR NEGATIVE 05/11/2020 [place] 05/11/2020 1925   KETONESUR NEGATIVE 05/11/2020 1925   PROTEINUR NEGATIVE 05/11/2020 1925   UROBILINOGEN 0.2 05/06/2016 1708   NITRITE NEGATIVE 05/11/2020 1925   LEUKOCYTESUR TRACE (A) 05/11/2020 1925   Sepsis Labs Invalid input(s): PROCALCITONIN,  WBC,  LACTICIDVEN Microbiology Recent Results (from the past 240 hour(s))  Respiratory Panel by RT PCR (Flu A&B, Covid) - Nasopharyngeal Swab     Status: None   Collection Time: 05/11/20 12:08 PM   Specimen: Nasopharyngeal Swab  Result Value Ref Range Status   SARS Coronavirus 2 by RT PCR NEGATIVE NEGATIVE Final    Comment: (NOTE) SARS-CoV-2 target nucleic acids are NOT DETECTED.  The SARS-CoV-2 RNA is generally detectable in upper respiratoy specimens during the acute phase of infection. The lowest concentration of SARS-CoV-2 viral copies this assay can detect is 131 copies/mL. A negative result does not preclude SARS-Cov-2 infection and  should not be used as the sole basis for treatment or other patient management decisions. A negative result may occur with  improper specimen collection/handling, submission of specimen other than nasopharyngeal swab, presence of viral mutation(s) within the areas targeted by this assay, and inadequate number of viral copies (<131 copies/mL). A negative result must be combined with clinical observations, patient history, and epidemiological information. The expected result is Negative.  Fact Sheet for Patients:  PinkCheek.be  Fact Sheet for Healthcare Providers:  GravelBags.it  This test is no t yet approved or cleared by the [place] FDA and  has been authorized for detection and/or diagnosis of SARS-CoV-2 by FDA under an Emergency Use Authorization (EUA). This EUA will remain  in effect (meaning this test can be used) for the duration of the COVID-19 declaration under Section 564(b)(1) of the Act, 21 U.S.C. section 360bbb-3(b)(1), unless the authorization is terminated or revoked sooner.     Influenza A by PCR NEGATIVE NEGATIVE Final   Influenza B by PCR NEGATIVE NEGATIVE Final    Comment: (NOTE) The Xpert Xpress SARS-CoV-2/FLU/RSV assay is intended as an aid in  the diagnosis of influenza from Nasopharyngeal swab specimens and  should not be used as a sole basis for treatment. Nasal washings and  aspirates are unacceptable for Xpert Xpress SARS-CoV-2/FLU/RSV  testing.  Fact Sheet for Patients: PinkCheek.be  Fact Sheet for Healthcare Providers: GravelBags.it  This test is not yet approved or cleared by the [place] FDA and  has been authorized for detection and/or diagnosis of SARS-CoV-2 by  FDA under an Emergency Use Authorization (EUA). This EUA will remain  in effect (meaning this test can be used) for the duration of the  Covid-19 declaration  under Section 564(b)(1) of the Act, 21  U.S.C. section 360bbb-3(b)(1), unless the authorization is  terminated or revoked. Performed at [facility], [place] [address] [place], [place] [address]   Blood culture (routine x 2)     Status: None   Collection Time: 05/11/20 10:00 PM   Specimen: BLOOD RIGHT FOREARM  Result Value Ref Range Status   Specimen Description BLOOD RIGHT FOREARM  Final   Special Requests   Final    BOTTLES DRAWN AEROBIC AND ANAEROBIC Blood Culture results may not be optimal due to an inadequate volume of blood received in culture bottles   Culture   Final    NO GROWTH 5 DAYS Performed at [facility], [place] [address] [place], [place] [address]    Report Status 05/16/2020 FINAL  Final  Blood culture (routine x 2)     Status: None   Collection Time: 05/11/20 10:10 PM   Specimen: BLOOD  Result Value Ref Range Status   Specimen Description BLOOD RIGHT ANTECUBITAL  Final   Special Requests   Final    BOTTLES DRAWN AEROBIC AND ANAEROBIC Blood Culture adequate volume   Culture   Final    NO GROWTH 5 DAYS Performed at [facility], [place] [address], [place], [place] [address]    Report Status 05/16/2020 FINAL  Final     Time coordinating discharge: Over 40 minutes  SIGNED:   Charlynne Cousins, MD  [facility] 05/18/2020, 8:54 AM Pager   If 7PM-7AM, please contact night-coverage [email or web address] Password [email or web address]

## 2020-05-18 NOTE — Plan of Care (Signed)
  Problem: Education: Goal: Knowledge of General Education information will improve Description: Including pain rating scale, medication(s)/side effects and non-pharmacologic comfort measures Outcome: Progressing   Problem: Health Behavior/Discharge Planning: Goal: Ability to manage health-related needs will improve Outcome: Progressing   Problem: Clinical Measurements: Goal: Ability to maintain clinical measurements within normal limits will improve Outcome: Progressing Goal: Will remain free from infection Outcome: Progressing Goal: Diagnostic test results will improve Outcome: Progressing Goal: Respiratory complications will improve Outcome: Progressing Goal: Cardiovascular complication will be avoided Outcome: Progressing   Problem: Activity: Goal: Risk for activity intolerance will decrease Outcome: Progressing   Problem: Nutrition: Goal: Adequate nutrition will be maintained Outcome: Progressing   Problem: Coping: Goal: Level of anxiety will decrease Outcome: Completed/Met   Problem: Elimination: Goal: Will not experience complications related to bowel motility Outcome: Progressing Goal: Will not experience complications related to urinary retention Outcome: Progressing   Problem: Pain Managment: Goal: General experience of comfort will improve Outcome: Completed/Met   Problem: Safety: Goal: Ability to remain free from injury will improve Outcome: Progressing

## 2020-05-18 NOTE — Plan of Care (Signed)
  Problem: Education: Goal: Knowledge of General Education information will improve Description: Including pain rating scale, medication(s)/side effects and non-pharmacologic comfort measures Outcome: Adequate for Discharge   Problem: Health Behavior/Discharge Planning: Goal: Ability to manage health-related needs will improve Outcome: Adequate for Discharge   Problem: Clinical Measurements: Goal: Ability to maintain clinical measurements within normal limits will improve Outcome: Adequate for Discharge Goal: Will remain free from infection Outcome: Adequate for Discharge Goal: Diagnostic test results will improve Outcome: Adequate for Discharge Goal: Respiratory complications will improve Outcome: Adequate for Discharge Goal: Cardiovascular complication will be avoided Outcome: Adequate for Discharge   Problem: Activity: Goal: Risk for activity intolerance will decrease Outcome: Adequate for Discharge   Problem: Nutrition: Goal: Adequate nutrition will be maintained Outcome: Adequate for Discharge   Problem: Elimination: Goal: Will not experience complications related to bowel motility Outcome: Adequate for Discharge Goal: Will not experience complications related to urinary retention Outcome: Adequate for Discharge   Problem: Safety: Goal: Ability to remain free from injury will improve Outcome: Adequate for Discharge   Problem: Skin Integrity: Goal: Risk for impaired skin integrity will decrease Outcome: Adequate for Discharge   Problem: Activity: Goal: Ability to tolerate increased activity will improve Outcome: Adequate for Discharge   Problem: Respiratory: Goal: Ability to maintain adequate ventilation will improve Outcome: Adequate for Discharge Goal: Ability to maintain a clear airway will improve Outcome: Adequate for Discharge

## 2020-07-24 ENCOUNTER — Ambulatory Visit: Payer: Medicare Other | Admitting: Neurology

## 2020-07-24 ENCOUNTER — Other Ambulatory Visit: Payer: Self-pay

## 2020-07-24 ENCOUNTER — Telehealth: Payer: Self-pay | Admitting: Neurology

## 2020-07-24 ENCOUNTER — Encounter: Payer: Self-pay | Admitting: Neurology

## 2020-07-24 VITALS — BP 144/90 | HR 99 | Ht 62.0 in | Wt 143.6 lb

## 2020-07-24 DIAGNOSIS — R2689 Other abnormalities of gait and mobility: Secondary | ICD-10-CM | POA: Diagnosis not present

## 2020-07-24 DIAGNOSIS — R413 Other amnesia: Secondary | ICD-10-CM

## 2020-07-24 DIAGNOSIS — G3184 Mild cognitive impairment, so stated: Secondary | ICD-10-CM | POA: Diagnosis not present

## 2020-07-24 MED ORDER — CEREFOLIN 6-1-50-5 MG PO TABS: 1.0000 | ORAL_TABLET | Freq: Every morning | ORAL | 3 refills | Status: DC

## 2020-07-24 NOTE — Progress Notes (Signed)
[facility] [address] [place]. [place] [address] [phone]       OFFICE CONSULT NOTE  [name] Date of Birth:  [birth year] Medical Record Number:  [id number]   Referring MD: Reynold Bowen Reason for Referral: Memory and cognitive difficulties  HPI: [name] is a pleasant 79 year old Caucasian lady seen today for initial office consultation visit.  She is accompanied by her daughter [name].  History is obtained from them, review of referral notes and I personally reviewed available imaging films in PACS. She has past medical history of hypertension, osteoarthritis, peptic ulcer and peripheral vascular disease.  She states for a year or so she has noticed some cognitive difficulties.  She has trouble remembering recent information as well as trouble with concentration and staying on task and often gets distracted.  She is not able to complete tasks well.  She feels her symptoms seem to have gotten worse the last couple of months after she she was admitted for pneumonia in the hospital.  Since then she was quite deconditioned and required physical therapy for ambulation has gradually improved and graduated from using a walker to a cane and now can walk independently.  She still feels slightly off balance and fell twice while bending down but fortunately did not hurt herself.  She used to get headaches a year ago which was severe but of late she is not having bad headaches.  She denies any prior history of stroke, TIA, seizures, loss of consciousness or significant head injury.  There is no family history of dementia except in the maternal grandmother late in life.  She has no other new complaints today.  She actually did quite well on Mini-Mental status testing today and scored 30/30.  Geriatric depression scale she scored 5 which is borderline for depression ROS:   14 system review of systems is positive for memory difficulties, word finding difficulties,  decreased concentration, gait difficulties, imbalance, disorientation, headaches all other systems negative PMH:  Past Medical History:  Diagnosis Date  . Cancer ([place]) 2014   kidney  . Esophageal stricture   . Fibromyalgia   . GERD (gastroesophageal reflux disease)   . Helicobacter pylori gastritis   . Hypertension   . Intention tremor   . Lumbago   . Osteoarthritis of spine   . Pneumonia    hx of years ago   . PUD (peptic ulcer disease)   . PVD (peripheral vascular disease) ([place])     Social History:  Social History   Socioeconomic History  . Marital status: Widowed    Spouse name: Not on file  . Number of children: 2  . Years of education: BA  . Highest education level: Not on file  Occupational History  . Occupation: retired[employer]  . 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 Smoking status: Never Smoker  . Smokeless tobacco: Never Used  Substance and Sexual Activity  . Alcohol use: Yes    Alcohol/week: 0.0 standard drinks    Comment: occasional glass of wine with dinner   . Drug use: No  . Sexual activity: Not Currently  Other Topics Concern  . Not on file  Social History Narrative   Lives alone   Right Handed   Drinks 2 cups coffee daily   Social Determinants of Health   Financial Resource Strain: Not on file  Food Insecurity: Not on file  Transportation Needs: Not on file  Physical Activity: Not on  file  Stress: Not on file  Social Connections: Not on file  Intimate Partner Violence: Not on file    Medications:   Current Outpatient Medications on File Prior to Visit  Medication Sig Dispense Refill  . acetaminophen (TYLENOL) 325 MG tablet Take 650 mg by mouth every 6 (six) hours as needed for headache.    . ALPRAZolam (XANAX) 0.5 MG tablet Take 1 tablet (0.5 mg total) by mouth 2 (two) times daily as needed. for anxiety (Patient taking differently: Take 1 mg by mouth at bedtime.) 60 tablet 1  . amitriptyline (ELAVIL) 25 MG  tablet TAKE 1 TABLET BY MOUTH EVERY DAY AT BEDTIME (Patient taking differently: Take 25 mg by mouth at bedtime.) 90 tablet 0  . amLODipine (NORVASC) 5 MG tablet Take 1 tablet (5 mg total) by mouth daily.    . Artificial Tear Ointment (DRY EYES OP) Apply 1 drop to eye 3 (three) times daily as needed (for dry eyes).    Marland Kitchen buPROPion (WELLBUTRIN) 75 MG tablet Take 75 mg by mouth every morning.    . calcium-vitamin D (OSCAL WITH D) 500-200 MG-UNIT per tablet Take 1 tablet by mouth daily.    . cholecalciferol (VITAMIN D) 1000 UNITS tablet Take 1,000 Units by mouth daily.    . DULoxetine (CYMBALTA) 30 MG capsule Take 30 mg by mouth See admin instructions. Take 1 capsule (30 mg) combine with 1 capsule (60 mg) by mouth daily    . DULoxetine (CYMBALTA) 60 MG capsule Take 60 mg by mouth See admin instructions. Take 1 capsule (60 mg) combine with 1 capsule (30 mg) by mouth daily  1  . furosemide (LASIX) 20 MG tablet Take 1 tablet (20 mg total) by mouth daily. NEED ANNUAL VISIT FOR FURTHER REFILLS (Patient taking differently: Take 20 mg by mouth daily.) 90 tablet 0  . levothyroxine (SYNTHROID, LEVOTHROID) 75 MCG tablet Take 75 mcg by mouth daily.   3  . melatonin 3 MG TABS tablet Take 3 mg by mouth at bedtime.    . meloxicam (MOBIC) 7.5 MG tablet Take 7.5 mg by mouth daily.   2  . metoprolol succinate (TOPROL-XL) 50 MG 24 hr tablet Take 1 tablet (50 mg total) by mouth daily. Take with or immediately following a meal. NEED ANNUAL VISIT FOR FURTHER REFILLS 90 tablet 0  . Multiple Vitamins-Minerals (MULTIVITAMIN WITH MINERALS) tablet Take 1 tablet by mouth daily.    Marland Kitchen omeprazole (PRILOSEC) 40 MG capsule Take 1 capsule (40 mg total) by mouth daily. NEED ANNUAL VISIT FOR FURTHER REFILLS (Patient taking differently: Take 40 mg by mouth daily.) 90 capsule 0  . polyethylene glycol powder (GLYCOLAX/MIRALAX) powder DISSOLVE 17 GRAMS INTO LIQUID AND DRINK BY MOUTH EVERY DAY (Patient taking differently: Take 1 Container by  mouth daily.) 527 g 0  . RESTASIS 0.05 % ophthalmic emulsion Place 1 drop into both eyes 2 (two) times daily.    . traMADol (ULTRAM) 50 MG tablet TAKE 1 TABLET BY MOUTH EVERY 8 HOURS AS NEEDED FOR MODERATE TO SEVERE PAIN. MAY TAKE 1 ADDITIONAL TABLET DAILY AS NEEDED FOR SEVERE PAIN (Patient taking differently: Take 50 mg by mouth 2 (two) times daily.) 100 tablet 0  . losartan (COZAAR) 100 MG tablet Take 1 tablet (100 mg total) by mouth daily. NEED ANNUAL VISIT FOR FURTHER REFILLS 90 tablet 0   No current facility-administered medications on file prior to visit.    Allergies:   Allergies  Allergen Reactions  . Morphine And Related Itching and  Rash    Given in IV.    Physical Exam General: well developed, well nourished elderly Caucasian lady, seated, in no evident distress Head: head normocephalic and atraumatic.   Neck: supple with no carotid or supraclavicular bruits Cardiovascular: regular rate and rhythm, no murmurs Musculoskeletal: no deformity Skin:  no rash/petichiae Vascular:  Normal pulses all extremities  Neurologic Exam Mental Status: Awake and fully alert. Oriented to place and time. Recent and remote memory intact. Attention span, concentration and fund of knowledge appropriate. Mood and affect appropriate.  Mini-Mental status exam score 30/30 without deficits.  Recall 3/3.  Able to name 14 animals which can walk on 4 legs.  Clock drawing 4/4.  Able to copy intersecting pentagons well. Cranial Nerves: Fundoscopic exam reveals sharp disc margins. Pupils equal, briskly reactive to light. Extraocular movements full without nystagmus. Visual fields full to confrontation. Hearing intact. Facial sensation intact. Face, tongue, palate moves normally and symmetrically.  Motor: Normal bulk and tone. Normal strength in all tested extremity muscles. Sensory.: intact to touch , pinprick , position and vibratory sensation.  Coordination: Rapid alternating movements normal in all  extremities. Finger-to-nose and heel-to-shin performed accurately bilaterally. Gait and Station: Arises from chair without difficulty. Stance is normal. Gait demonstrates normal stride length and balance . Able to heel, toe and tandem walk with mild difficulty.  Reflexes: 1+ and symmetric. Toes downgoing.       ASSESSMENT: 66 year old lady with a 1 year history of mild memory and cognitive difficulties due to mild cognitive impairment.     PLAN:  I had a long discussion with the patient and her daughter regarding her memory and cognitive difficulties which are likely due to age-appropriate mild cognitive impairment.  I recommend further evaluation by checking memory panel labs, EEG and MRI scan of the brain.  I advised her to start taking Cerefolin NAC 1 tablet daily to help with memory loss as well as increase participation in cognitively challenging activities like solving crossword puzzles, playing bridge and sodoku.  We also discussed memory compensation strategies.  She will return for follow-up in the future in 3 months or call earlier if necessary.  Greater than 50% time during this 45-minute consultation visit was spent on counseling and coordination of care about of mild cognitive impairment and memory loss and answering questions. Antony Contras, MD  [facility] [address] [place], [place] [address]  Phone [phone] Fax [phone] Note: This document was prepared with digital dictation and possible smart phrase technology. Any transcriptional errors that result from this process are unintentional.

## 2020-07-24 NOTE — Patient Instructions (Signed)
I had a long discussion with the patient and her daughter regarding her memory and cognitive difficulties which are likely due to age-appropriate mild cognitive impairment.  I recommend further evaluation by checking memory panel labs, EEG and MRI scan of the brain.  I advised her to start taking Cerefolin NAC 1 tablet daily to help with memory loss as well as increase participation in cognitively challenging activities like solving crossword puzzles, playing bridge and sodoku.  We also discussed memory compensation strategies.  She will return for follow-up in the future in 3 months or call earlier if necessary. Memory Compensation Strategies  1. Use "WARM" strategy.  W= write it down  A= associate it  R= repeat it  M= make a mental note  2.   You can keep a Social worker.  Use a 3-ring notebook with sections for the following: calendar, important names and phone numbers,  medications, doctors' names/phone numbers, lists/reminders, and a section to journal what you did  each day.   3.    Use a calendar to write appointments down.  4.    Write yourself a schedule for the day.  This can be placed on the calendar or in a separate section of the Memory Notebook.  Keeping a  regular schedule can help memory.  5.    Use medication organizer with sections for each day or morning/evening pills.  You may need help loading it  6.    Keep a basket, or pegboard by the door.  Place items that you need to take out with you in the basket or on the pegboard.  You may also want to  include a message board for reminders.  7.    Use sticky notes.  Place sticky notes with reminders in a place where the task is performed.  For example: " turn off the  stove" placed by the stove, "lock the door" placed on the door at eye level, " take your medications" on  the bathroom mirror or by the place where you normally take your medications.  8.    Use alarms/timers.  Use while cooking to remind yourself to check on  food or as a reminder to take your medicine, or as a  reminder to make a call, or as a reminder to perform another task, etc.

## 2020-07-24 NOTE — Progress Notes (Signed)
Ne patient

## 2020-07-24 NOTE — Telephone Encounter (Signed)
[employer] medicare order sent to [facility]. No auth they will reach out to the patient to schedule.  

## 2020-07-26 ENCOUNTER — Other Ambulatory Visit: Payer: Medicare Other

## 2020-07-26 DIAGNOSIS — R410 Disorientation, unspecified: Secondary | ICD-10-CM | POA: Diagnosis not present

## 2020-07-26 LAB — DEMENTIA PANEL
Homocysteine: 9.2 umol/L (ref 0.0–19.2)
RPR Ser Ql: NONREACTIVE
TSH: 2.81 u[IU]/mL (ref 0.450–4.500)
Vitamin B-12: 648 pg/mL (ref 232–1245)

## 2020-08-07 ENCOUNTER — Other Ambulatory Visit: Payer: Self-pay

## 2020-08-07 ENCOUNTER — Ambulatory Visit
Admission: RE | Admit: 2020-08-07 | Discharge: 2020-08-07 | Disposition: A | Discharge status: Home / Self Care | Payer: Medicare Other | Source: Ambulatory Visit | Attending: Neurology | Admitting: Neurology

## 2020-08-07 DIAGNOSIS — R413 Other amnesia: Secondary | ICD-10-CM

## 2020-08-07 MED ORDER — GADOBENATE DIMEGLUMINE 529 MG/ML IV SOLN
13.0000 mL | Freq: Once | INTRAVENOUS | Status: AC | PRN
  Administered 2020-08-07: 13 mL via INTRAVENOUS

## 2020-08-07 NOTE — Progress Notes (Signed)
Kindly inform the patient that EEG study was normal

## 2020-08-08 ENCOUNTER — Telehealth: Payer: Self-pay | Admitting: Neurology

## 2020-08-08 NOTE — Telephone Encounter (Signed)
Called the patient and advised of the normal EEG finding. Pt verbalized understanding.

## 2020-08-08 NOTE — Telephone Encounter (Signed)
-----   Message from Micki Riley, MD sent at 08/07/2020  8:45 AM EST ----- Kindly inform the patient that EEG study was normal

## 2020-08-09 ENCOUNTER — Encounter: Payer: Self-pay | Admitting: *Deleted

## 2020-08-09 NOTE — Progress Notes (Signed)
Kindly inform the patient that lab work for reversible causes of memory loss was all normal

## 2020-08-09 NOTE — Progress Notes (Signed)
Kindly inform the patient that EEG study was normal

## 2020-08-10 NOTE — Progress Notes (Signed)
Kindly inform the patient her MRI scan of the brain shows age-appropriate changes of little hardening of the arteries which is to be expected but no new or worrisome finding.

## 2020-08-14 NOTE — Telephone Encounter (Signed)
Called and spoke to patient regarding Dr. Marlis Edelson findings from MRI.  Patient denied any questions and expressed appreciation.

## 2020-08-14 NOTE — Telephone Encounter (Signed)
-----   Message from Micki Riley, MD sent at 08/10/2020  5:47 PM EST ----- Kindly inform the patient her MRI scan of the brain shows age-appropriate changes of little hardening of the arteries which is to be expected but no new or worrisome finding.

## 2020-10-30 ENCOUNTER — Ambulatory Visit: Payer: Medicare Other | Admitting: Neurology

## 2021-02-19 ENCOUNTER — Ambulatory Visit: Payer: Medicare Other | Admitting: Neurology

## 2021-02-19 ENCOUNTER — Other Ambulatory Visit: Payer: Self-pay

## 2021-02-19 VITALS — BP 128/70 | HR 65 | Ht 62.0 in | Wt 145.0 lb

## 2021-02-19 DIAGNOSIS — G3184 Mild cognitive impairment, so stated: Secondary | ICD-10-CM | POA: Diagnosis not present

## 2021-02-19 DIAGNOSIS — R413 Other amnesia: Secondary | ICD-10-CM | POA: Diagnosis not present

## 2021-02-19 NOTE — Progress Notes (Signed)
[facility] [address] [place]. [place] [address] [phone]       OFFICE FOLLOW UP VISIT NOTE  [name] Date of Birth:  17-Oct-1940 Medical Record Number:  [id number]   Referring MD: Reynold Bowen Reason for Referral: Memory and cognitive difficulties  HPI: Initial visit 07/24/2020 :Ms. Cavey is a pleasant 80 year old Caucasian lady seen today for initial office consultation visit.  She is accompanied by her daughter [name].  History is obtained from them, review of referral notes and I personally reviewed available imaging films in PACS. She has past medical history of hypertension, osteoarthritis, peptic ulcer and peripheral vascular disease.  She states for a year or so she has noticed some cognitive difficulties.  She has trouble remembering recent information as well as trouble with concentration and staying on task and often gets distracted.  She is not able to complete tasks well.  She feels her symptoms seem to have gotten worse the last couple of months after she she was admitted for pneumonia in the hospital.  Since then she was quite deconditioned and required physical therapy for ambulation has gradually improved and graduated from using a walker to a cane and now can walk independently.  She still feels slightly off balance and fell twice while bending down but fortunately did not hurt herself.  She used to get headaches a year ago which was severe but of late she is not having bad headaches.  She denies any prior history of stroke, TIA, seizures, loss of consciousness or significant head injury.  There is no family history of dementia except in the maternal grandmother late in life.  She has no other new complaints today.  She actually did quite well on Mini-Mental status testing today and scored 30/30.  Geriatric depression scale she scored 5 which is borderline for depression. Update 02/19/2021: She returns for follow-up after last visit 7 months  ago.  Patient states she is noted some improvement in her memory and cognitive difficulties since last visit.  She has been doing regular crossword puzzles as well as online brain games.  She is also been taking Cerefolin NAC daily and tolerating it well.  She is also been quite active and walks 45 minutes daily as well as does daily squats to strengthen her thigh muscles which helps getting up.  She is independent in actives of daily living.  She lives alone.  She has no new complaints.  She did undergo lab work on 07/24/2020 and vitamin B12, TSH, homocystine and RPR were all normal.  EEG on 08/07/2020 was normal.  MRI scan of the brain on 08/10/2020 shows mild age-related changes of chronic small vessel disease and atrophy.  No acute abnormalities were noted. ROS:   14 system review of systems is positive for memory difficulties, word finding difficulties, decreased concentration, gait difficulties, imbalance,  all other systems negative PMH:  Past Medical History:  Diagnosis Date   Cancer ([place]) 2014   kidney   Esophageal stricture    Fibromyalgia    GERD (gastroesophageal reflux disease)    Helicobacter pylori gastritis    Hypertension    Intention tremor    Lumbago    Osteoarthritis of spine    Pneumonia    hx of years ago    PUD (peptic ulcer disease)    PVD (peripheral vascular disease) ([place])     Social History:  Social History   Socioeconomic History   Marital status: Widowed    Spouse name:  Not on file   Number of children: 2   Years of education: BA   Highest education level: Not on file  Occupational History   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  Tobacco Use   Smoking status: Never   Smokeless tobacco: Never  Substance and Sexual Activity   Alcohol use: Yes    Alcohol/week: 0.0 standard drinks    Comment: occasional glass of wine with dinner    Drug use: No   Sexual activity: Not Currently  Other Topics Concern   Not  on file  Social History Narrative   Lives alone   Right Handed   Drinks 2 cups coffee daily   Social Determinants of Health   Financial Resource Strain: Not on file  Food Insecurity: Not on file  Transportation Needs: Not on file  Physical Activity: Not on file  Stress: Not on file  Social Connections: Not on file  Intimate Partner Violence: Not on file    Medications:   Current Outpatient Medications on File Prior to Visit  Medication Sig Dispense Refill   acetaminophen (TYLENOL) 325 MG tablet Take 650 mg by mouth every 6 (six) hours as needed for headache.     ALPRAZolam (XANAX) 0.5 MG tablet Take 1 tablet (0.5 mg total) by mouth 2 (two) times daily as needed. for anxiety (Patient taking differently: Take 1 mg by mouth at bedtime.) 60 tablet 1   amitriptyline (ELAVIL) 25 MG tablet TAKE 1 TABLET BY MOUTH EVERY DAY AT BEDTIME (Patient taking differently: Take 25 mg by mouth at bedtime.) 90 tablet 0   amLODipine (NORVASC) 5 MG tablet Take 1 tablet (5 mg total) by mouth daily.     Artificial Tear Ointment (DRY EYES OP) Apply 1 drop to eye 3 (three) times daily as needed (for dry eyes).     buPROPion (WELLBUTRIN) 75 MG tablet Take 75 mg by mouth every morning.     calcium-vitamin D (OSCAL WITH D) 500-200 MG-UNIT per tablet Take 1 tablet by mouth daily.     cholecalciferol (VITAMIN D) 1000 UNITS tablet Take 1,000 Units by mouth daily.     DULoxetine (CYMBALTA) 30 MG capsule Take 30 mg by mouth See admin instructions. Take 1 capsule (30 mg) combine with 1 capsule (60 mg) by mouth daily     DULoxetine (CYMBALTA) 60 MG capsule Take 60 mg by mouth See admin instructions. Take 1 capsule (60 mg) combine with 1 capsule (30 mg) by mouth daily  1   furosemide (LASIX) 20 MG tablet Take 1 tablet (20 mg total) by mouth daily. NEED ANNUAL VISIT FOR FURTHER REFILLS (Patient taking differently: Take 20 mg by mouth daily.) 90 tablet 0   L-Methylfolate-B12-B6-B2 (CEREFOLIN) 01-10-49-5 MG TABS Take 1 tablet  by mouth every morning. 90 tablet 3   levothyroxine (SYNTHROID, LEVOTHROID) 75 MCG tablet Take 75 mcg by mouth daily.   3   melatonin 3 MG TABS tablet Take 3 mg by mouth at bedtime.     meloxicam (MOBIC) 7.5 MG tablet Take 7.5 mg by mouth daily.   2   metoprolol succinate (TOPROL-XL) 50 MG 24 hr tablet Take 1 tablet (50 mg total) by mouth daily. Take with or immediately following a meal. NEED ANNUAL VISIT FOR FURTHER REFILLS 90 tablet 0   Multiple Vitamins-Minerals (MULTIVITAMIN WITH MINERALS) tablet Take 1 tablet by mouth daily.     omeprazole (PRILOSEC) 40 MG capsule Take 1 capsule (40 mg total) by  mouth daily. NEED ANNUAL VISIT FOR FURTHER REFILLS (Patient taking differently: Take 40 mg by mouth daily.) 90 capsule 0   polyethylene glycol powder (GLYCOLAX/MIRALAX) powder DISSOLVE 17 GRAMS INTO LIQUID AND DRINK BY MOUTH EVERY DAY (Patient taking differently: Take 1 Container by mouth daily.) 527 g 0   RESTASIS 0.05 % ophthalmic emulsion Place 1 drop into both eyes 2 (two) times daily.     traMADol (ULTRAM) 50 MG tablet TAKE 1 TABLET BY MOUTH EVERY 8 HOURS AS NEEDED FOR MODERATE TO SEVERE PAIN. MAY TAKE 1 ADDITIONAL TABLET DAILY AS NEEDED FOR SEVERE PAIN (Patient taking differently: Take 50 mg by mouth 2 (two) times daily.) 100 tablet 0   losartan (COZAAR) 100 MG tablet Take 1 tablet (100 mg total) by mouth daily. NEED ANNUAL VISIT FOR FURTHER REFILLS 90 tablet 0   No current facility-administered medications on file prior to visit.    Allergies:   Allergies  Allergen Reactions   Morphine And Related Itching and Rash    Given in IV.    Physical Exam General: well developed, well nourished elderly Caucasian lady, seated, in no evident distress Head: head normocephalic and atraumatic.   Neck: supple with no carotid or supraclavicular bruits Cardiovascular: regular rate and rhythm, no murmurs Musculoskeletal: no deformity Skin:  no rash/petichiae Vascular:  Normal pulses all  extremities  Neurologic Exam Mental Status: Awake and fully alert. Oriented to place and time. Recent and remote memory intact. Attention span, concentration and fund of knowledge appropriate. Mood and affect appropriate.  Mini-Mental status exam not done today.  S.  Recall 3/3.  Able to name 14 animals which can walk on 4 legs.  Clock drawing 4/4.  Able to copy intersecting pentagons well. Cranial Nerves: Fundoscopic exam reveals sharp disc margins. Pupils equal, briskly reactive to light. Extraocular movements full without nystagmus. Visual fields full to confrontation. Hearing intact. Facial sensation intact. Face, tongue, palate moves normally and symmetrically.  Motor: Normal bulk and tone. Normal strength in all tested extremity muscles. Sensory.: intact to touch , pinprick , position and vibratory sensation.  Coordination: Rapid alternating movements normal in all extremities. Finger-to-nose and heel-to-shin performed accurately bilaterally. Gait and Station: Arises from chair without difficulty. Stance is normal. Gait demonstrates normal stride length and balance . Able to heel, toe and tandem walk with mild difficulty.  Reflexes: 1+ and symmetric. Toes downgoing.       ASSESSMENT: 80 year old lady with a 1.5 year history of mild memory and cognitive difficulties due to mild cognitive impairment.     PLAN: I had a long discussion with the patient with regards to her mild cognitive impairment and discussed the results of lab work, MRI scan and EEG study and answered questions.  Recommend she continue Cerefolin NAC 1 tablet daily as well as continue participation in cognitively challenging activities like solving crossword puzzles, playing bridge and sodoku.  We also discussed memory compensation strategies.  She will return for follow-up in the future in 6 months with my nurse practitioner Janett Billow or call earlier if necessary. Greater than 50% time during this 25-minute  visit was spent on  counseling and coordination of care about of mild cognitive impairment and memory loss and answering questions. Antony Contras, MD  [facility] [address] [place], [place] [address]  Phone [phone] Fax [phone] Note: This document was prepared with digital dictation and possible smart phrase technology. Any transcriptional errors that result from this process are unintentional.

## 2021-02-19 NOTE — Patient Instructions (Signed)
I had a long discussion with the patient with regards to her mild cognitive impairment and discussed the results of lab work, MRI scan and EEG study and answered questions.  Recommend she continue Cerefolin NAC 1 tablet daily as well as continue participation in cognitively challenging activities like solving crossword puzzles, playing bridge and sodoku.  We also discussed memory compensation strategies.  She will return for follow-up in the future in 6 months with my nurse practitioner Janett Billow or call earlier if necessary. Memory Compensation Strategies  Use "WARM" strategy.  W= write it down  A= associate it  R= repeat it  M= make a mental note  2.   You can keep a Social worker.  Use a 3-ring notebook with sections for the following: calendar, important names and phone numbers,  medications, doctors' names/phone numbers, lists/reminders, and a section to journal what you did  each day.   3.    Use a calendar to write appointments down.  4.    Write yourself a schedule for the day.  This can be placed on the calendar or in a separate section of the Memory Notebook.  Keeping a  regular schedule can help memory.  5.    Use medication organizer with sections for each day or morning/evening pills.  You may need help loading it  6.    Keep a basket, or pegboard by the door.  Place items that you need to take out with you in the basket or on the pegboard.  You may also want to  include a message board for reminders.  7.    Use sticky notes.  Place sticky notes with reminders in a place where the task is performed.  For example: " turn off the  stove" placed by the stove, "lock the door" placed on the door at eye level, " take your medications" on  the bathroom mirror or by the place where you normally take your medications.  8.    Use alarms/timers.  Use while cooking to remind yourself to check on food or as a reminder to take your medicine, or as a  reminder to make a call, or as a  reminder to perform another task, etc.

## 2021-03-08 ENCOUNTER — Encounter: Payer: Self-pay | Admitting: Internal Medicine

## 2021-03-08 ENCOUNTER — Ambulatory Visit: Payer: Medicare Other | Admitting: Internal Medicine

## 2021-03-08 VITALS — BP 119/70 | HR 53 | Ht 62.0 in | Wt 146.6 lb

## 2021-03-08 DIAGNOSIS — R159 Full incontinence of feces: Secondary | ICD-10-CM

## 2021-03-08 DIAGNOSIS — K5900 Constipation, unspecified: Secondary | ICD-10-CM | POA: Diagnosis not present

## 2021-03-08 MED ORDER — POLYETHYLENE GLYCOL 3350 17 GM/SCOOP PO POWD: 1.0000 | Freq: Every day | ORAL | 0 refills | Status: DC | PRN

## 2021-03-08 MED ORDER — CITRUCEL PO POWD: ORAL | Status: DC

## 2021-03-08 NOTE — Patient Instructions (Signed)
If you are age 80 or older, your body mass index should be between 23-30. Your Body mass index is 26.81 kg/m. If this is out of the aforementioned range listed, please consider follow up with your Primary Care Provider. __________________________________________________________  The [facility] providers would like to encourage you to use [facility] to communicate with providers for non-urgent requests or questions.  Due to long hold times on the telephone, sending your provider a message by [facility] may be a faster and more efficient way to get a response.  Please allow 48 business hours for a response.  Please remember that this is for non-urgent requests.   Please start taking citrucel (orange flavored) powder fiber supplement.  This may cause some bloating at first but that usually goes away. Begin with a small spoonful and work your way up to 2 tablespoons in 14oz of water or juice daily.  You can use Miralax daily as needed.  Thank you for entrusting me with your care and choosing [facility].  Dr Henrene Pastor

## 2021-03-08 NOTE — Progress Notes (Signed)
HISTORY OF PRESENT ILLNESS:  [name] is a 80 y.o. female with past medical history as listed below.  She presents today regarding issues with constipation, fecal leakage, and perirectal irritation.  The patient has a history of GERD with incidental esophageal stricture.  She is maintained on omeprazole with good control of symptoms.  She does have chronic constipation for which she was last evaluated via telehealth medicine January 22, 2019.  This after recovering from acute diarrheal illness.  She has been using MiraLAX for her bowels.  However, about 2 months ago she began to notice that her stools have a soft consistency and that feces would ooze out of the rectum approximately once per week.  This resulted in perirectal irritation.  Thus, she discontinued MiraLAX but developed constipation which was uncomfortable.  She presents today wondering what to do.  Review of outside blood work from October 2021 shows unremarkable basic metabolic panel.  CT from May 11, 2020 revealed no acute abnormalities of the abdomen or pelvis.  Increased colonic stool burden mentioned.  Also has undergone colonoscopy in 2009 and most recently June 2017.  Examinations, including intubation of the ileum, were unremarkable except for sigmoid diverticulosis.  REVIEW OF SYSTEMS:  All non-GI ROS negative unless otherwise stated in the HPI except for urinary leakage, ankle swelling, arthritis, back pain, fatigue  Past Medical History:  Diagnosis Date   Cancer ([place]) 2014   kidney   Esophageal stricture    Fibromyalgia    GERD (gastroesophageal reflux disease)    Helicobacter pylori gastritis    Hypertension    Intention tremor    Lumbago    Osteoarthritis of spine    Pneumonia    hx of years ago    PUD (peptic ulcer disease)    PVD (peripheral vascular disease) ([place])     Past Surgical History:  Procedure Laterality Date   [place]   CATARACT  EXTRACTION Bilateral 2014   shapiro   LAPAROSCOPIC PARTIAL NEPHRECTOMY Left April '13   [facility], Dr. Alinda Money   LUMBAR FUSION  03/2003   L4-5   [place]    Social History Lillyahna Pulkrabek  reports that she has never smoked. She has never used smokeless tobacco. She reports current alcohol use. She reports that she does not use drugs.  family history includes COPD in her mother; Crohn's disease in an other family member; Diabetes in her daughter, father, and son; Hearing loss in her brother; Heart disease in her brother, father, and mother; Hyperlipidemia in her brother; Hypertension in her son; Melanoma in her son; Parkinson's disease in her father; Stroke in her brother and father; Thyroid disease in her mother.  Allergies  Allergen Reactions   Morphine And Related Itching and Rash    Given in IV.       PHYSICAL EXAMINATION: Vital signs: BP 119/70   Pulse (!) 53   Ht '5\' 2"'$  (1.575 m)   Wt 146 lb 9.6 oz (66.5 kg)   SpO2 97%   BMI 26.81 kg/m   Constitutional: generally well-appearing, no acute distress Psychiatric: alert and oriented x3, cooperative Eyes: extraocular movements intact, anicteric, conjunctiva pink Mouth: oral pharynx moist, no lesions Neck: supple no lymphadenopathy Cardiovascular: heart regular rate and rhythm, no murmur Lungs: clear to auscultation bilaterally Abdomen: soft, nontender, nondistended, no obvious ascites, no peritoneal signs, normal bowel sounds, no organomegaly Rectal: Omitted Extremities: no  clubbing, cyanosis, or lower extremity edema bilaterally Skin: no lesions on visible extremities Neuro: No focal deficits.  Cranial nerves intact  ASSESSMENT:  1.  Fecal leakage. 2.  Chronic constipation 3.  Colonoscopy 2009 in 2017 negative for neoplasia 4.  GERD with incidental esophageal stricture.  Asymptomatic on PPI 5.  General medical problems   PLAN:  1.  Recommend Metamucil 2 tablespoons daily.  This should  address constipation and stool consistency issues.  Hopefully reduce fecal leakage. 2.  Reflux precautions 3.  Continue PPI 4.  GI follow-up as needed Total time of 30 minutes was spent preparing to see the patient, reviewing tests and endoscopy reports, obtaining comprehensive history, performing medically appropriate physical examination, counseling the patient regarding her above listed issues, directing medical therapy, and documenting clinical information in the health record

## 2021-03-27 ENCOUNTER — Encounter (HOSPITAL_BASED_OUTPATIENT_CLINIC_OR_DEPARTMENT_OTHER): Payer: Self-pay

## 2021-03-27 ENCOUNTER — Emergency Department (HOSPITAL_BASED_OUTPATIENT_CLINIC_OR_DEPARTMENT_OTHER)
Admission: EM | Admit: 2021-03-27 | Discharge: 2021-03-27 | Disposition: A | Discharge status: Home / Self Care | Payer: Medicare Other | Attending: Emergency Medicine | Admitting: Emergency Medicine

## 2021-03-27 ENCOUNTER — Other Ambulatory Visit: Payer: Self-pay

## 2021-03-27 ENCOUNTER — Emergency Department (HOSPITAL_BASED_OUTPATIENT_CLINIC_OR_DEPARTMENT_OTHER): Payer: Medicare Other

## 2021-03-27 DIAGNOSIS — Z85520 Personal history of malignant carcinoid tumor of kidney: Secondary | ICD-10-CM | POA: Diagnosis not present

## 2021-03-27 DIAGNOSIS — S300XXA Contusion of lower back and pelvis, initial encounter: Secondary | ICD-10-CM | POA: Insufficient documentation

## 2021-03-27 DIAGNOSIS — E039 Hypothyroidism, unspecified: Secondary | ICD-10-CM | POA: Insufficient documentation

## 2021-03-27 DIAGNOSIS — W19XXXA Unspecified fall, initial encounter: Secondary | ICD-10-CM

## 2021-03-27 DIAGNOSIS — Z79899 Other long term (current) drug therapy: Secondary | ICD-10-CM | POA: Diagnosis not present

## 2021-03-27 DIAGNOSIS — I10 Essential (primary) hypertension: Secondary | ICD-10-CM | POA: Diagnosis not present

## 2021-03-27 DIAGNOSIS — W1830XA Fall on same level, unspecified, initial encounter: Secondary | ICD-10-CM | POA: Insufficient documentation

## 2021-03-27 DIAGNOSIS — Y92009 Unspecified place in unspecified non-institutional (private) residence as the place of occurrence of the external cause: Secondary | ICD-10-CM | POA: Diagnosis not present

## 2021-03-27 DIAGNOSIS — S3993XA Unspecified injury of pelvis, initial encounter: Secondary | ICD-10-CM | POA: Diagnosis present

## 2021-03-27 NOTE — ED Triage Notes (Signed)
Pt states she fell in her BR 8/8-c/o pain to right buttock/hip and LE-NAD-steady gait

## 2021-03-27 NOTE — ED Provider Notes (Signed)
[place] EMERGENCY DEPARTMENT Provider Note   CSN: [id number] Arrival date & time: 03/27/21  1152     History Chief Complaint  Patient presents with   [name]    [name] is a 80 y.o. female.   Fall Pertinent negatives include no chest pain, no abdominal pain and no shortness of breath. Patient presents after fall.  [place] around a week ago.  Landed on her buttocks when she missed the toilet.  States her right buttock area landed on a scale.  Has had some mild pain since then but states pain is gotten worse.  Appears to be deep in her buttock.  Still able ambulate.  No bleeding.  No numbness or weakness.  No bruising.  Not on anticoagulation.  No other injury.  Saw her PCP today had a fall but states he was not very worried about it.     Past Medical History:  Diagnosis Date   Cancer [facility]) 2014   kidney   Esophageal stricture    Fibromyalgia    GERD (gastroesophageal reflux disease)    Helicobacter pylori gastritis    Hypertension    Intention tremor    Lumbago    Osteoarthritis of spine    Pneumonia    hx of years ago    PUD (peptic ulcer disease)    PVD (peripheral vascular disease) ([place])     Patient Active Problem List   Diagnosis Date Noted   Acute respiratory failure with hypoxia ([place]) 05/11/2020   Right upper lobe pneumonia 05/11/2020   Hyponatremia 05/11/2020   GERD (gastroesophageal reflux disease)    Anxiety with depression    Anxiety 11/14/2016   Swelling 06/18/2016   Hypothyroidism 05/17/2016   Abnormal CT scan 12/28/2015   Chronic low back pain 04/13/2015   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 Alinda Money   LUMBAR FUSION  03/2003   L4-5   [place]     OB History   No obstetric history on file.     Family History  Problem Relation Age of Onset   Heart disease Mother    COPD Mother    Thyroid disease Mother    Diabetes Father    Heart disease Father        CABG   Stroke Father    Parkinson's disease Father    Heart disease Brother    Stroke Brother    Hyperlipidemia Brother    Diabetes Daughter    Hearing loss Brother    Crohn's disease Other        neice   Hypertension Son    Diabetes Son    Melanoma Son    Colon cancer Neg Hx    Stomach cancer Neg Hx    Esophageal cancer Neg Hx     Social History   Tobacco Use   Smoking status: Never   Smokeless tobacco: Never  Vaping Use   Vaping Use: Never used  Substance Use Topics   Alcohol  use: Yes    Alcohol/week: 0.0 standard drinks    Comment: occasional glass of wine with dinner    Drug use: No    Home Medications Prior to Admission medications   Medication Sig Start Date End Date Taking? Authorizing Provider  acetaminophen (TYLENOL) 325 MG tablet Take 650 mg by mouth every 6 (six) hours as needed for headache.    [provider]  ALPRAZolam Duanne Moron) 0.5 MG tablet Take 1 tablet (0.5 mg total) by mouth 2 (two) times daily as needed. for anxiety Patient taking differently: Take 1 mg by mouth at bedtime. 11/14/16   Plotnikov, Evie Lacks, MD  amitriptyline (ELAVIL) 25 MG tablet TAKE 1 TABLET BY MOUTH EVERY DAY AT BEDTIME Patient taking differently: Take 25 mg by mouth at bedtime. 10/23/17   Hoyt Koch, MD  amLODipine (NORVASC) 5 MG tablet Take 1 tablet (5 mg total) by mouth daily. 11/07/16   Lauree Chandler, NP  Artificial Tear Ointment (DRY EYES OP) Apply 1 drop to eye 3 (three) times daily as needed (for dry eyes).     [provider]  buPROPion (WELLBUTRIN) 75 MG tablet Take 75 mg by mouth every morning. 02/28/20   [provider]  calcium-vitamin D (OSCAL WITH D) 500-200 MG-UNIT per tablet Take 1 tablet by mouth daily.    [provider]  Collagen Hydrolysate, Bovine, POWD Take 1 Scoop by mouth daily.    [provider]  DULoxetine (CYMBALTA) 30 MG capsule Take 30 mg by mouth See admin instructions. Take 1 capsule (30 mg) combine with 1 capsule (60 mg) by mouth daily    [provider]  DULoxetine (CYMBALTA) 60 MG capsule Take 60 mg by mouth See admin instructions. Take 1 capsule (60 mg) combine with 1 capsule (30 mg) by mouth daily 04/14/18   [provider]  furosemide (LASIX) 20 MG tablet Take 1 tablet (20 mg total) by mouth daily. NEED ANNUAL VISIT FOR FURTHER REFILLS Patient taking differently: Take 20 mg by mouth daily. 04/29/17   Hoyt Koch, MD  L-Methylfolate-B12-B6-B2 (CEREFOLIN) 01-10-49-5 MG TABS Take 1 tablet by mouth every morning. 07/24/20   Garvin Fila, MD  levothyroxine (SYNTHROID, LEVOTHROID) 75 MCG tablet Take 75 mcg by mouth daily.  02/06/18   [provider]  losartan (COZAAR) 100 MG tablet Take 1 tablet (100 mg total) by mouth daily. NEED ANNUAL VISIT FOR FURTHER REFILLS 04/29/17 05/11/20  Hoyt Koch, MD  melatonin 3 MG TABS tablet Take 3 mg by mouth at bedtime.    [provider]  meloxicam (MOBIC) 7.5 MG tablet Take 7.5 mg by mouth daily.  03/25/18   [provider]  methylcellulose (CITRUCEL) oral powder 2 tablespoons daily 03/08/21   Irene Shipper, MD  metoprolol succinate (TOPROL-XL) 50 MG 24 hr tablet Take 1 tablet (50 mg total) by mouth daily. Take with or immediately following a meal. NEED ANNUAL VISIT FOR FURTHER REFILLS 04/29/17   Hoyt Koch, MD  Multiple Vitamins-Minerals (MULTIVITAMIN WITH MINERALS) tablet Take 1 tablet by mouth daily.    [provider]  omeprazole  (PRILOSEC) 40 MG capsule Take 1 capsule (40 mg total) by mouth daily. NEED ANNUAL VISIT FOR FURTHER REFILLS Patient taking differently: Take 40 mg by mouth daily. 04/29/17   Hoyt Koch, MD  polyethylene glycol powder (GLYCOLAX/MIRALAX) 17 GM/SCOOP powder Take 255 g by mouth daily as needed. 03/08/21   Irene Shipper, MD  RESTASIS 0.05 % ophthalmic emulsion Place  1 drop into both eyes 2 (two) times daily. 03/07/20   [provider]  traMADol (ULTRAM) 50 MG tablet TAKE 1 TABLET BY MOUTH EVERY 8 HOURS AS NEEDED FOR MODERATE TO SEVERE PAIN. MAY TAKE 1 ADDITIONAL TABLET DAILY AS NEEDED FOR SEVERE PAIN Patient taking differently: Take 50 mg by mouth 2 (two) times daily. 10/24/16   Hoyt Koch, MD    Allergies    Morphine and related  Review of Systems   Review of Systems  Constitutional:  Negative for appetite change.  HENT:  Negative for congestion.   Respiratory:  Negative for shortness of breath.   Cardiovascular:  Negative for chest pain.  Gastrointestinal:  Negative for abdominal pain.  Musculoskeletal:  Negative for back pain.       Right-sided buttock pain.  Skin:  Negative for wound.  Neurological:  Negative for weakness and numbness.  Psychiatric/Behavioral:  Negative for confusion.    Physical Exam Updated Vital Signs BP 129/66 (BP Location: Left Arm)   Pulse (!) 56   Temp 98.3 F (36.8 C) (Oral)   Resp 20   Ht '5\' 2"'$  (1.575 m)   Wt 65.8 kg   SpO2 100%   BMI 26.52 kg/m   Physical Exam Vitals and nursing note reviewed.  Constitutional:      Appearance: Normal appearance.  HENT:     Head: Atraumatic.  Eyes:     Pupils: Pupils are equal, round, and reactive to light.  Cardiovascular:     Rate and Rhythm: Regular rhythm.  Chest:     Chest wall: No tenderness.  Abdominal:     Tenderness: There is no abdominal tenderness.  Musculoskeletal:        General: Tenderness present.     Cervical back: Neck supple.     Comments: Tenderness to  palpation of the right buttock.  No deformity.  Good range of motion hip.  Pelvis stable to rock and lateral pressure.  Skin:    General: Skin is warm.     Capillary Refill: Capillary refill takes less than 2 seconds.  Neurological:     Mental Status: She is alert and oriented to person, place, and time.    ED Results / Procedures / Treatments   Labs (all labs ordered are listed, but only abnormal results are displayed) Labs Reviewed - No data to display  EKG None  Radiology DG Pelvis 1-2 Views  Result Date: 03/27/2021 CLINICAL DATA:  Pelvic pain after fall 03/19/2021 EXAM: PELVIS - 1-2 VIEW COMPARISON:  05/11/2020 FINDINGS: There is no evidence of pelvic fracture or diastasis. Hip joint spaces are maintained. No pelvic bone lesions are seen. Lower lumbar fusion hardware. IMPRESSION: Negative. Electronically Signed   By: Davina Poke D.O.   On: 03/27/2021 13:40    Procedures Procedures   Medications Ordered in ED Medications - No data to display  ED Course  I have reviewed the triage vital signs and the nursing notes.  Pertinent labs & imaging results that were available during my care of the patient were reviewed by me and considered in my medical decision making (see chart for details).    MDM Rules/Calculators/A&P                          Patient presents after fall.  Fell 8 days ago.  Landed on her buttocks.  Right buttock pain.  No lateral tenderness over hip there is some posterior tenderness.  Good range of  motion hip.  Pelvis stable.  Occult fracture felt less likely.  Doubt hip fracture since the pain is more posterior and good range of motion that is painless in the hip.  Likely contusion.  Appears stable for outpatient follow-up.  PCP follow-up as needed. Final Clinical Impression(s) / ED Diagnoses Final diagnoses:  Fall in home, initial encounter  Contusion of buttock, initial encounter    Rx / DC Orders ED Discharge Orders     None        Davonna Belling, MD 03/27/21 1420

## 2021-03-27 NOTE — ED Notes (Signed)
Pt ambulatory to treatment room without difficulty. Gait steady and even.

## 2021-03-27 NOTE — ED Notes (Signed)
Patient Alert and oriented to baseline. Stable and ambulatory to baseline. Patient verbalized understanding of the discharge instructions.  Patient belongings were taken by the patient.   

## 2021-04-02 ENCOUNTER — Other Ambulatory Visit (HOSPITAL_COMMUNITY): Payer: Self-pay | Admitting: *Deleted

## 2021-04-03 ENCOUNTER — Encounter (HOSPITAL_COMMUNITY): Payer: Self-pay

## 2021-04-03 ENCOUNTER — Inpatient Hospital Stay (HOSPITAL_COMMUNITY): Admission: RE | Admit: 2021-04-03 | Payer: Medicare Other | Source: Ambulatory Visit

## 2021-04-20 ENCOUNTER — Ambulatory Visit (HOSPITAL_COMMUNITY)
Admission: RE | Admit: 2021-04-20 | Discharge: 2021-04-20 | Disposition: A | Discharge status: Home / Self Care | Payer: Medicare Other | Source: Ambulatory Visit | Attending: Endocrinology | Admitting: Endocrinology

## 2021-04-20 DIAGNOSIS — M810 Age-related osteoporosis without current pathological fracture: Secondary | ICD-10-CM | POA: Insufficient documentation

## 2021-04-20 MED ORDER — DENOSUMAB 60 MG/ML ~~LOC~~ SOSY: 60.0000 mg | PREFILLED_SYRINGE | Freq: Once | SUBCUTANEOUS | Status: AC

## 2021-04-20 MED ORDER — DENOSUMAB 60 MG/ML ~~LOC~~ SOSY
PREFILLED_SYRINGE | SUBCUTANEOUS | Status: AC
  Administered 2021-04-20: 60 mg via SUBCUTANEOUS
  Filled 2021-04-20: qty 1

## 2021-05-09 ENCOUNTER — Ambulatory Visit: Payer: Medicare Other | Admitting: Family Medicine

## 2021-09-03 ENCOUNTER — Ambulatory Visit: Payer: Medicare Other | Admitting: Adult Health

## 2021-09-18 ENCOUNTER — Other Ambulatory Visit: Payer: Self-pay | Admitting: Neurosurgery

## 2021-09-18 DIAGNOSIS — M542 Cervicalgia: Secondary | ICD-10-CM

## 2021-09-18 DIAGNOSIS — G8929 Other chronic pain: Secondary | ICD-10-CM

## 2021-09-24 ENCOUNTER — Ambulatory Visit
Admission: RE | Admit: 2021-09-24 | Discharge: 2021-09-24 | Disposition: A | Discharge status: Home / Self Care | Payer: Medicare Other | Source: Ambulatory Visit | Attending: Neurosurgery | Admitting: Neurosurgery

## 2021-09-24 ENCOUNTER — Other Ambulatory Visit: Payer: Self-pay

## 2021-09-24 ENCOUNTER — Other Ambulatory Visit: Payer: Medicare Other

## 2021-09-24 DIAGNOSIS — G8929 Other chronic pain: Secondary | ICD-10-CM

## 2021-09-24 DIAGNOSIS — M542 Cervicalgia: Secondary | ICD-10-CM

## 2021-09-24 MED ORDER — MEPERIDINE HCL 50 MG/ML IJ SOLN: 50.0000 mg | Freq: Once | INTRAMUSCULAR | Status: DC | PRN

## 2021-09-24 MED ORDER — DIAZEPAM 5 MG PO TABS
5.0000 mg | ORAL_TABLET | Freq: Once | ORAL | Status: AC
  Administered 2021-09-24: 5 mg via ORAL

## 2021-09-24 MED ORDER — ONDANSETRON HCL 4 MG/2ML IJ SOLN: 4.0000 mg | Freq: Once | INTRAMUSCULAR | Status: DC | PRN

## 2021-09-24 MED ORDER — IOPAMIDOL (ISOVUE-M 300) INJECTION 61%
10.0000 mL | Freq: Once | INTRAMUSCULAR | Status: AC
  Administered 2021-09-24: 10 mL via INTRATHECAL

## 2021-09-24 NOTE — Discharge Instructions (Signed)

## 2021-10-24 NOTE — Progress Notes (Signed)
?[facility] ?[address] ?[place]. [place] [address] ?[phone] ? ?     OFFICE FOLLOW UP VISIT NOTE ? ?[name] ?Date of Birth:  [birth year] ?Medical Record Number:  [id number]  ? ?Referring MD: [name] ?Reason for Referral: Memory and cognitive difficulties ? ? ?Chief Complaint  ?Patient presents with  ? Follow-up  ?  [name] 3 alone  ?Pt is well, states her cognition and memory has improved since last visit   ?  ? ? ?HPI:  ? ?Update 10/24/2021 [name]: 81 year old female with hx of MCI who returned for follow up visit.  Doing well since prior visit and reports improvement of cognition. She will have occasional "age related" short term memory issues or delayed recall but overall stable.  She no longer takes Cerefolin.  Lives alone, independent for ADLs and IADLs and drives.  Has remained healthy throughout the winter maintaining a good diet and adequate/routine exercise. Follows closely with PCP Dr. Forde [name] with recent lab work. No new concerns at this time.  ? ? ? ? ?History provided for reference purposes only  ?Update 02/19/2021 Dr. Leonie [name]: She returns for follow-up after last visit 7 months ago.  Patient states she is noted some improvement in her memory and cognitive difficulties since last visit.  She has been doing regular crossword puzzles as well as online brain games.  She is also been taking Cerefolin NAC daily and tolerating it well.  She is also been quite active and walks 45 minutes daily as well as does daily squats to strengthen her thigh muscles which helps getting up.  She is independent in actives of daily living.  She lives alone.  She has no new complaints.  She did undergo lab work on 07/24/2020 and vitamin B12, TSH, homocystine and RPR were all normal.  EEG on 08/07/2020 was normal.  MRI scan of the brain on 08/10/2020 shows mild age-related changes of chronic small vessel disease and atrophy.  No acute abnormalities were noted. ? ?Initial visit 07/24/2020 Dr. Leonie [name]  :[name] is a pleasant 81 year old Caucasian lady seen today for initial office consultation visit.  She is accompanied by her daughter [name].  History is obtained from them, review of referral notes and I personally reviewed available imaging films in PACS. ?She has past medical history of hypertension, osteoarthritis, peptic ulcer and peripheral vascular disease.  She states for a year or so she has noticed some cognitive difficulties.  She has trouble remembering recent information as well as trouble with concentration and staying on task and often gets distracted.  She is not able to complete tasks well.  She feels her symptoms seem to have gotten worse the last couple of months after she she was admitted for pneumonia in the hospital.  Since then she was quite deconditioned and required physical therapy for ambulation has gradually improved and graduated from using a walker to a cane and now can walk independently.  She still feels slightly off balance and fell twice while bending down but fortunately did not hurt herself.  She used to get headaches a year ago which was severe but of late she is not having bad headaches.  She denies any prior history of stroke, TIA, seizures, loss of consciousness or significant head injury.  There is no family history of dementia except in the maternal grandmother late in life.  She has no other new complaints today.  She actually did quite well on Mini-Mental status testing today and  scored 30/30.  Geriatric depression scale she scored 5 which is borderline for depression. ? ? ? ? ? ? ?ROS:   ?14 system review of systems is positive for those as in HPI and all other systems negative ?PMH:  ?Past Medical History:  ?Diagnosis Date  ? Cancer [facility]) 2014  ? kidney  ? Esophageal stricture   ? Fibromyalgia   ? GERD (gastroesophageal reflux disease)   ? Helicobacter pylori gastritis   ? Hypertension   ? Intention tremor   ? Lumbago   ? Osteoarthritis of spine   ? Pneumonia   ?  hx of years ago   ? PUD (peptic ulcer disease)   ? PVD (peripheral vascular disease) ([place])   ? ? ?Social History:  ?Social History  ? ?Socioeconomic History  ? Marital status: Widowed  ?  Spouse name: Not on file  ? Number of children: 2  ? Years of education: BA  ? Highest education level: Not on file  ?Occupational History  ? Occupation: retired[employer]  ? 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 Smoking status: Never  ? Smokeless tobacco: Never  ?Vaping Use  ? Vaping Use: Never used  ?Substance and Sexual Activity  ? Alcohol use: Yes  ?  Alcohol/week: 0.0 standard drinks  ?  Comment: occasional glass of wine with dinner   ? Drug use: No  ? Sexual activity: Not Currently  ?Other Topics Concern  ? Not on file  ?Social History Narrative  ? Lives alone  ? Right Handed  ? Drinks 2 cups coffee daily  ? ?Social Determinants of Health  ? ?Financial Resource Strain: Not on file  ?Food Insecurity: Not on file  ?Transportation Needs: Not on file  ?Physical Activity: Not on file  ?Stress: Not on file  ?Social Connections: Not on file  ?Intimate Partner Violence: Not on file  ? ? ?Medications:   ?Current Outpatient Medications on File Prior to Visit  ?Medication Sig Dispense Refill  ? ALPRAZolam (XANAX) 0.5 MG tablet Take 1 tablet (0.5 mg total) by mouth 2 (two) times daily as needed. for anxiety (Patient taking differently: Take 1 mg by mouth at bedtime.) 60 tablet 1  ? amitriptyline (ELAVIL) 25 MG tablet TAKE 1 TABLET BY MOUTH EVERY DAY AT BEDTIME (Patient taking differently: Take 25 mg by mouth at bedtime.) 90 tablet 0  ? amLODipine (NORVASC) 5 MG tablet Take 1 tablet (5 mg total) by mouth daily.    ? Artificial Tear Ointment (DRY EYES OP) Apply 1 drop to eye 3 (three) times daily as needed (for dry eyes).    ? buPROPion (WELLBUTRIN) 75 MG tablet Take 75 mg by mouth every morning.    ? calcium-vitamin D (OSCAL WITH D) 500-200 MG-UNIT per tablet Take 1 tablet by mouth daily.    ? Collagen  Hydrolysate, Bovine, POWD Take 1 Scoop by mouth daily.    ? DULoxetine (CYMBALTA) 30 MG capsule Take 30 mg by mouth See admin instructions. Take 1 capsule (30 mg) combine with 1 capsule (60 mg) by mouth daily    ? DULoxetine (CYMBALTA) 60 MG capsule Take 60 mg by mouth See admin instructions. Take 1 capsule (60 mg) combine with 1 capsule (30 mg) by mouth daily  1  ? furosemide (LASIX) 20 MG tablet Take 1 tablet (20 mg total) by mouth daily. NEED ANNUAL VISIT FOR FURTHER REFILLS (Patient taking differently: Take 20 mg by mouth daily.) 90 tablet 0  ?  levothyroxine (SYNTHROID, LEVOTHROID) 75 MCG tablet Take 75 mcg by mouth daily.   3  ? melatonin 3 MG TABS tablet Take 3 mg by mouth at bedtime.    ? meloxicam (MOBIC) 7.5 MG tablet Take 7.5 mg by mouth daily.   2  ? methylcellulose (CITRUCEL) oral powder 2 tablespoons daily    ? metoprolol succinate (TOPROL-XL) 50 MG 24 hr tablet Take 1 tablet (50 mg total) by mouth daily. Take with or immediately following a meal. NEED ANNUAL VISIT FOR FURTHER REFILLS 90 tablet 0  ? Multiple Vitamins-Minerals (MULTIVITAMIN WITH MINERALS) tablet Take 1 tablet by mouth daily.    ? omeprazole (PRILOSEC) 40 MG capsule Take 1 capsule (40 mg total) by mouth daily. NEED ANNUAL VISIT FOR FURTHER REFILLS (Patient taking differently: Take 40 mg by mouth daily.) 90 capsule 0  ? traMADol (ULTRAM) 50 MG tablet TAKE 1 TABLET BY MOUTH EVERY 8 HOURS AS NEEDED FOR MODERATE TO SEVERE PAIN. MAY TAKE 1 ADDITIONAL TABLET DAILY AS NEEDED FOR SEVERE PAIN (Patient taking differently: Take 50 mg by mouth 2 (two) times daily.) 100 tablet 0  ? losartan (COZAAR) 100 MG tablet Take 1 tablet (100 mg total) by mouth daily. NEED ANNUAL VISIT FOR FURTHER REFILLS 90 tablet 0  ? ?No current facility-administered medications on file prior to visit.  ? ? ?Allergies:   ?Allergies  ?Allergen Reactions  ? Morphine And Related Itching and Rash  ?  Given in IV.  ? ? ?Physical Exam ?Today's Vitals  ? 10/25/21 0747  ?BP: 139/77   ?Pulse: 60  ?Weight: 149 lb (67.6 kg)  ?Height: '5\' 2"'$  (1.575 m)  ? ?Body mass index is 27.25 kg/m?. ? ?General: well developed, well nourished very pleasant elderly Caucasian lady, seated, in no evident distress

## 2021-10-25 ENCOUNTER — Ambulatory Visit: Payer: Medicare Other | Admitting: Adult Health

## 2021-10-25 ENCOUNTER — Encounter: Payer: Self-pay | Admitting: Adult Health

## 2021-10-25 VITALS — BP 139/77 | HR 60 | Ht 62.0 in | Wt 149.0 lb

## 2021-10-25 DIAGNOSIS — G3184 Mild cognitive impairment, so stated: Secondary | ICD-10-CM | POA: Diagnosis not present

## 2021-10-25 NOTE — Patient Instructions (Addendum)
Your Plan: ? ?As you have been doing well, you can follow up with [place] as needed  ? ? ? ? ?Thank you for coming to see [place] at [facility]. I hope we have been able to provide you high quality care today. ? ?You may receive a patient satisfaction survey over the next few weeks. We would appreciate your feedback and comments so that we may continue to improve ourselves and the health of our patients. ? ?

## 2021-11-30 ENCOUNTER — Ambulatory Visit: Payer: Medicare Other | Attending: Neurosurgery | Admitting: Physical Therapy

## 2021-11-30 ENCOUNTER — Encounter: Payer: Self-pay | Admitting: Physical Therapy

## 2021-11-30 DIAGNOSIS — M5459 Other low back pain: Secondary | ICD-10-CM | POA: Insufficient documentation

## 2021-11-30 DIAGNOSIS — M62830 Muscle spasm of back: Secondary | ICD-10-CM | POA: Insufficient documentation

## 2021-11-30 DIAGNOSIS — R296 Repeated falls: Secondary | ICD-10-CM | POA: Diagnosis present

## 2021-11-30 NOTE — Patient Instructions (Signed)
Access Code: [email or web address] ?URL: [email or web address] ?Date: 11/30/2021 ?Prepared by: Lum Babe ? ?Exercises ?- Hooklying Single Knee to Chest  - 2 x daily - 7 x weekly - 1 sets - 10 reps - 5 hold ?- Supine Double Knee to Chest  - 2 x daily - 7 x weekly - 1 sets - 10 reps - 5 hold ?- Supine Lower Trunk Rotation  - 2 x daily - 7 x weekly - 1 sets - 10 reps - 5 hold ?

## 2021-11-30 NOTE — Therapy (Signed)
[facility] ?[address] ?[address] ?[place], [place], [address] ?Phone: [phone]   Fax:  [phone] ? ?Physical Therapy Evaluation ? ?Patient Details  ?Name: [name] ?MRN: [id number] ?Date of Birth: Nov 27, 1940 ?Referring Provider (PT): Arnoldo Morale ? ? ?Encounter Date: 11/30/2021 ? ? PT End of Session - 11/30/21 0855   ? ? Visit Number 1   ? Date for PT Re-Evaluation 03/01/22   ? Authorization Type UHC MC   ? PT Start Time 0800   ? PT Stop Time 9678   ? PT Time Calculation (min) 65 min   ? Activity Tolerance Patient tolerated treatment well   ? Behavior During Therapy [facility] for tasks assessed/performed   ? ?  ?  ? ?  ? ? ?Past Medical History:  ?Diagnosis Date  ? Cancer [facility]) 2014  ? kidney  ? Esophageal stricture   ? Fibromyalgia   ? GERD (gastroesophageal reflux disease)   ? Helicobacter pylori gastritis   ? Hypertension   ? Intention tremor   ? Lumbago   ? Osteoarthritis of spine   ? Pneumonia   ? hx of years ago   ? PUD (peptic ulcer disease)   ? PVD (peripheral vascular disease) ([place])   ? ? ?Past Surgical History:  ?Procedure Laterality Date  ? ABDOMINAL HYSTERECTOMY  1985  ? BREAST ENHANCEMENT SURGERY  1974  ? CATARACT EXTRACTION Bilateral 2014  ? shapiro  ? LAPAROSCOPIC PARTIAL NEPHRECTOMY Left April '13  ? [facility], Dr. Alinda Money  ? LUMBAR FUSION  03/2003  ? L4-5  ? TONSILLECTOMY  1958  ? TUBAL LIGATION  1984  ? ? ?There were no vitals filed for this visit. ? ? ? Subjective Assessment - 11/30/21 0808   ? ? Subjective Patient was seen here a few years ago for LBP, she had 1 level fusion at L4-5 in 2004, she reports that she did well.  She reports that the past 6 months she started having radicular pain in the right buttocks, the right posterior leg and some numbness in the toes.  She has a steoid injection about a month ago wihtout any relief.  She reports more difficulty with things around the house.  She did have a 10 day hospital stay due to lung issues back in October  2022.  She does report some unsteady gait   ? How long can you stand comfortably? difficulty cooking   ? How long can you walk comfortably? has been having help with shopping   ? Diagnostic tests 1. 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  2. Unchanged moderate to severe bilateral neuroforaminal stenosis at  L3-L4.  3. Prior L4-L5 PLIF without hardware complication.  4. Progressive severe bilateral facet arthropathy at L5-S1.   ? Patient Stated Goals have less pain, feel stronger   ? Currently in Pain? Yes   ? Pain Score 7    ? Pain Location Back   ? Pain Orientation Lower   ? Pain Descriptors / Indicators Aching;Sharp;Shooting;Sore;Burning;Numbness;Tingling   ? Pain Type Acute pain   ? Pain Radiating Towards pain in the right buttock, right leg and numbness in the right foot   ? Pain Onset More than a month ago   ? Pain Frequency Constant   ? Aggravating Factors  standing pain can be up to 10/10   ? Pain Relieving Factors ice, lie on left side, recliner  at best pain is a 4-5/10   ? Effect of Pain on Daily Activities  limits everything   ? ?  ?  ? ?  ? ? ? ? ? [facility] PT Assessment - 11/30/21 0001   ? ?  ? Assessment  ? Medical Diagnosis lumbar radiculopathy   ? Referring Provider (PT) Arnoldo Morale   ? Onset Date/Surgical Date 10/30/21   ? Prior Therapy years ago   ?  ? Precautions  ? Precautions None   ?  ? Balance Screen  ? Has the patient fallen in the past 6 months Yes   ? How many times? 2   ? Has the patient had a decrease in activity level because of a fear of falling?  Yes   ? Is the patient reluctant to leave their home because of a fear of falling?  No   ?  ? Home Environment  ? Additional Comments has stairs, does some housework and some gardening and yardwork but has help now   ?  ? Prior Function  ? Level of Independence Independent   ? Vocation Retired   ? Leisure no exercise   ?  ? Posture/Postural Control  ? Posture Comments fwd head, rounded shoulders   ?  ? ROM / Strength  ? AROM / PROM /  Strength AROM;Strength   ?  ? AROM  ? Overall AROM Comments Lumbar ROM decreased 50% with pain she also lost balance with this requiring min A to prevent LOB   ?  ? Strength  ? Overall Strength Comments right LE 4-/5, left 4/5 with some pain in the right low back and leg   ?  ? Flexibility  ? Soft Tissue Assessment /Muscle Length yes   ? Hamstrings tight   ? Piriformis tight   ?  ? Palpation  ? Palpation comment tight in the lumbar area, very tender right SI, buttock and the right leg   ?  ? Standardized Balance Assessment  ? Standardized Balance Assessment Berg Balance Test   ?  ? Berg Balance Test  ? Sit to Stand Able to stand without using hands and stabilize independently   ? Standing Unsupported Able to stand 2 minutes with supervision   ? Sitting with Back Unsupported but Feet Supported on Floor or Stool Able to sit safely and securely 2 minutes   ? Stand to Sit Controls descent by using hands   ? Transfers Able to transfer safely, definite need of hands   ? Standing Unsupported with Eyes Closed Able to stand 10 seconds safely   ? Standing Unsupported with Feet Together Able to place feet together independently and stand for 1 minute with supervision   ? From Standing, Reach Forward with Outstretched Arm Can reach forward >12 cm safely (5")   ? From Standing Position, Pick up Object from Floor Able to pick up shoe, needs supervision   ? From Standing Position, Turn to Look Behind Over each Shoulder Turn sideways only but maintains balance   ? Turn 360 Degrees Needs close supervision or verbal cueing   ? Standing Unsupported, Alternately Place Feet on Step/Stool Able to complete 4 steps without aid or supervision   ? Standing Unsupported, One Foot in Front Able to take small step independently and hold 30 seconds   ? Standing on One Leg Able to lift leg independently and hold equal to or more than 3 seconds   ? Total Score 39   ? ?  ?  ? ?  ? ? ? ? ? ? ? ? ? ? ? ? ? ?  Objective measurements completed on  examination: See above findings.  ? ? ? ? ? [facility] Adult PT Treatment/Exercise - 11/30/21 0001   ? ?  ? Modalities  ? Modalities Electrical Stimulation;Moist Heat   ?  ? Moist Heat Therapy  ? Number Minutes Moist Heat 10 Minutes   ? Moist Heat Location Lumbar Spine   ?  ? Electrical Stimulation  ? Electrical Stimulation Location right L/S and buttock   ? Electrical Stimulation Action IFC   ? Electrical Stimulation Parameters supine   ? Electrical Stimulation Goals Pain   ? ?  ?  ? ?  ? ? ? ? ? ? ? ? ? ? ? ? PT Short Term Goals - 11/30/21 0901   ? ?  ? PT SHORT TERM GOAL #1  ? Title independent iwth initial HEP   ? Time 2   ? Period Weeks   ? Status New   ? ?  ?  ? ?  ? ? ? ? PT Long Term Goals - 11/30/21 0901   ? ?  ? PT LONG TERM GOAL #1  ? Title decrease pain 50%   ? Time 12   ? Period Weeks   ? Status New   ?  ? PT LONG TERM GOAL #2  ? Title understand proper posture and body mechanics    ? Time 12   ? Period Weeks   ? Status New   ?  ? PT LONG TERM GOAL #3  ? Title increase lumbar ROM 25%   ? Time 12   ? Period Weeks   ? Status New   ?  ? PT LONG TERM GOAL #4  ? Title incresae berg balance score to 47/56   ? Time 12   ? Period Weeks   ? Status New   ? ?  ?  ? ?  ? ? ? ? ? ? ? ? ? Plan - 11/30/21 0856   ? ? Clinical Impression Statement Patient referred for low back pain, spondylolisthesis.  MRI showed a lot of stenosis and facet arthropathy.  She has had this previously, we treated her in PT in 2018 with great results, she had a 10 day hospitalization in October due to lung issues, she reports that in the past few months she has had increased right LBP, right buttock pain and numbness and tingling in the right foot, she also reports 2 falls.  Lumbar ROM is limited she is very tender in the right buttock and leg.  Berg balance test was 39/56 putting her at a high risk for falls, possibly due to decrease in activity over the psat 8 months.   ? Stability/Clinical Decision Making Evolving/Moderate complexity   ?  Clinical Decision Making Low   ? Rehab Potential Good   ? PT Frequency 2x / week   ? PT Duration 12 weeks   ? PT Treatment/Interventions ADLs/Self Care Home Management;Cryotherapy;Electrical Stimulation;Moist Heat;Stair

## 2021-12-03 ENCOUNTER — Ambulatory Visit: Payer: Medicare Other | Admitting: Physical Therapy

## 2021-12-03 ENCOUNTER — Encounter: Payer: Self-pay | Admitting: Physical Therapy

## 2021-12-03 DIAGNOSIS — M62830 Muscle spasm of back: Secondary | ICD-10-CM

## 2021-12-03 DIAGNOSIS — M5459 Other low back pain: Secondary | ICD-10-CM | POA: Diagnosis not present

## 2021-12-03 DIAGNOSIS — R296 Repeated falls: Secondary | ICD-10-CM

## 2021-12-03 NOTE — Therapy (Signed)
[facility] ?[address] ?[address] ?[place], [place], [address] ?Phone: [phone]   Fax:  [phone] ? ?Physical Therapy Treatment ? ?Patient Details  ?Name: [name] ?MRN: [id number] ?Date of Birth: 1940/11/24 ?Referring Provider (PT): Arnoldo Morale ? ? ?Encounter Date: 12/03/2021 ? ? PT End of Session - 12/03/21 1205   ? ? Visit Number 2   ? Date for PT Re-Evaluation 03/01/22   ? Authorization Type UHC MC   ? PT Start Time 1108   ? PT Stop Time 1150   ? PT Time Calculation (min) 42 min   ? Activity Tolerance Patient tolerated treatment well   ? Behavior During Therapy [facility] for tasks assessed/performed   ? ?  ?  ? ?  ? ? ?Past Medical History:  ?Diagnosis Date  ? Cancer [facility]) 2014  ? kidney  ? Esophageal stricture   ? Fibromyalgia   ? GERD (gastroesophageal reflux disease)   ? Helicobacter pylori gastritis   ? Hypertension   ? Intention tremor   ? Lumbago   ? Osteoarthritis of spine   ? Pneumonia   ? hx of years ago   ? PUD (peptic ulcer disease)   ? PVD (peripheral vascular disease) ([place])   ? ? ?Past Surgical History:  ?Procedure Laterality Date  ? ABDOMINAL HYSTERECTOMY  1985  ? BREAST ENHANCEMENT SURGERY  1974  ? CATARACT EXTRACTION Bilateral 2014  ? shapiro  ? LAPAROSCOPIC PARTIAL NEPHRECTOMY Left April '13  ? [facility], Dr. Alinda Money  ? LUMBAR FUSION  03/2003  ? L4-5  ? TONSILLECTOMY  1958  ? TUBAL LIGATION  1984  ? ? ?There were no vitals filed for this visit. ? ? Subjective Assessment - 12/03/21 1110   ? ? Subjective My back is bothering me today, the change in weather is making it worse. Standing for a long time and walking in the garage without shoes can make my pain worse too. Having shoes on is helpful. Dampness and coldness make it worse.   ? Diagnostic tests 1. 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  2. Unchanged moderate to severe bilateral neuroforaminal stenosis at  L3-L4.  3. Prior L4-L5 PLIF without hardware complication.  4. Progressive severe  bilateral facet arthropathy at L5-S1.   ? Patient Stated Goals have less pain, feel stronger   ? Currently in Pain? Yes   ? Pain Score 7    ? Pain Location Back   ? Pain Orientation Lower   ? Pain Descriptors / Indicators Sharp;Other (Comment);Burning;Tingling;Discomfort   intense  ? Pain Type Chronic pain   ? ?  ?  ? ?  ? ? ? ? ? ? ? ? ? ? ? ? ? ? ? ? ? ? ? ? [facility] Adult PT Treatment/Exercise - 12/03/21 0001   ? ?  ? Exercises  ? Exercises Lumbar   ?  ? Lumbar Exercises: Stretches  ? Active Hamstring Stretch 2 reps;30 seconds   ? Piriformis Stretch Right;Left;2 reps;30 seconds   ? Other Lumbar Stretch Exercise lumbar rotation stretch 5x10 seconds B   ? Other Lumbar Stretch Exercise QL stretch 3x30 seconds   ?  ? Lumbar Exercises: Standing  ? Other Standing Lumbar Exercises gait training 35f x2 once with SPC, [facility] cues for form/technique   ?  ? Lumbar Exercises: Supine  ? Bridge 10 reps   ? Bridge Limitations 2 second holds   ? Other Supine Lumbar Exercises clamshells red TB 1x10  2 second holds; bridge with clamshell 1x10 2 second holds   ? ?  ?  ? ?  ? ? ? ? ? ? ? ? ? ? PT Education - 12/03/21 1205   ? ? Education Details sequencing with quad cane, exericse form, home TENS use   ? Person(s) Educated Patient   ? Methods Explanation   ? Comprehension Verbalized understanding   ? ?  ?  ? ?  ? ? ? PT Short Term Goals - 11/30/21 0901   ? ?  ? PT SHORT TERM GOAL #1  ? Title independent iwth initial HEP   ? Time 2   ? Period Weeks   ? Status New   ? ?  ?  ? ?  ? ? ? ? PT Long Term Goals - 11/30/21 0901   ? ?  ? PT LONG TERM GOAL #1  ? Title decrease pain 50%   ? Time 12   ? Period Weeks   ? Status New   ?  ? PT LONG TERM GOAL #2  ? Title understand proper posture and body mechanics    ? Time 12   ? Period Weeks   ? Status New   ?  ? PT LONG TERM GOAL #3  ? Title increase lumbar ROM 25%   ? Time 12   ? Period Weeks   ? Status New   ?  ? PT LONG TERM GOAL #4  ? Title incresae berg balance score to 47/56   ?  Time 12   ? Period Weeks   ? Status New   ? ?  ?  ? ?  ? ? ? ? ? ? ? ? Plan - 12/03/21 1205   ? ? Clinical Impression Statement [name] arrived doing OK today, still having a lot of pain. I encouraged her to use her daughter?s home TENS unit so her pain is better managed at home. We worked on functional mm stretching as well as strengthening today, she was also concerned about her walking and wondering what type of cane would be best given her balance- we practiced with both SPC and SBQC, did better with [facility] but definitely needs more practice.   ? Stability/Clinical Decision Making Evolving/Moderate complexity   ? Clinical Decision Making Low   ? Rehab Potential Good   ? PT Frequency 2x / week   ? PT Duration 12 weeks   ? PT Treatment/Interventions ADLs/Self Care Home Management;Cryotherapy;Electrical Stimulation;Moist Heat;Stair training;Functional mobility training;Therapeutic activities;Therapeutic exercise;Balance training;Neuromuscular re-education;Manual techniques;Patient/family education;Dry needling   ? PT Next Visit Plan work on core strength, function, pain levels, and address her balance issues. Continue practice with quad cane   ? Consulted and Agree with Plan of Care Patient   ? ?  ?  ? ?  ? ? ?Patient will benefit from skilled therapeutic intervention in order to improve the following deficits and impairments:  Abnormal gait, Decreased range of motion, Difficulty walking, Increased muscle spasms, Decreased activity tolerance, Pain, Decreased balance, Impaired flexibility, Improper body mechanics, Postural dysfunction, Decreased strength, Decreased mobility ? ?Visit Diagnosis: ?Other low back pain ? ?Muscle spasm of back ? ?Repeated falls ? ? ? ? ?Problem List ?Patient Active Problem List  ? Diagnosis Date Noted  ? Acute respiratory failure with hypoxia ([place]) 05/11/2020  ? Right upper lobe pneumonia 05/11/2020  ? Hyponatremia 05/11/2020  ? GERD (gastroesophageal reflux disease)   ? Anxiety with  depression   ? Anxiety 11/14/2016  ? Swelling  06/18/2016  ? Hypothyroidism 05/17/2016  ? Abnormal CT scan 12/28/2015  ? Chronic low back pain 04/13/2015  ? Cervical disc disorder with radiculopathy of cervical region 06/20/2014  ? Bursitis of right shoulder 05/04/2014  ? Insomnia 04/21/2013  ? Routine health maintenance 01/28/2013  ? HTN (hypertension), benign 01/09/2012  ? Memory loss or impairment 12/02/2010  ? Fibromyalgia 05/04/2007  ? ?Trustin Chapa U PT, DPT, PN2  ? ?Supplemental Physical Therapist ?[facility]  ? ? ? ? ? ?[place] ?[address] ?[address] ?[place], [place], [address] ?Phone: [phone]   Fax:  [phone] ? ?Name: Elza Varricchio ?MRN: [id number] ?Date of Birth: 06-07-1941 ? ? ? ?

## 2021-12-05 ENCOUNTER — Ambulatory Visit: Payer: Medicare Other

## 2021-12-11 ENCOUNTER — Ambulatory Visit: Payer: Medicare Other | Admitting: Physical Therapy

## 2021-12-13 ENCOUNTER — Other Ambulatory Visit (HOSPITAL_COMMUNITY): Payer: Self-pay | Admitting: *Deleted

## 2021-12-14 ENCOUNTER — Ambulatory Visit: Payer: Medicare Other | Admitting: Physical Therapy

## 2021-12-14 ENCOUNTER — Ambulatory Visit (HOSPITAL_COMMUNITY)
Admission: RE | Admit: 2021-12-14 | Discharge: 2021-12-14 | Disposition: A | Discharge status: Home / Self Care | Payer: Medicare Other | Source: Ambulatory Visit | Attending: Endocrinology | Admitting: Endocrinology

## 2021-12-14 DIAGNOSIS — M810 Age-related osteoporosis without current pathological fracture: Secondary | ICD-10-CM | POA: Diagnosis present

## 2021-12-14 MED ORDER — DENOSUMAB 60 MG/ML ~~LOC~~ SOSY
60.0000 mg | PREFILLED_SYRINGE | Freq: Once | SUBCUTANEOUS | Status: AC
  Administered 2021-12-14: 60 mg via SUBCUTANEOUS

## 2021-12-14 MED ORDER — DENOSUMAB 60 MG/ML ~~LOC~~ SOSY
PREFILLED_SYRINGE | SUBCUTANEOUS | Status: AC
  Filled 2021-12-14: qty 1

## 2021-12-18 ENCOUNTER — Ambulatory Visit: Payer: Medicare Other | Admitting: Physical Therapy

## 2021-12-20 ENCOUNTER — Ambulatory Visit: Payer: Medicare Other | Admitting: Physical Therapy

## 2021-12-25 ENCOUNTER — Ambulatory Visit: Payer: Medicare Other | Admitting: Physical Therapy

## 2021-12-28 ENCOUNTER — Ambulatory Visit: Payer: Medicare Other | Admitting: Physical Therapy

## 2022-01-11 ENCOUNTER — Ambulatory Visit
Admission: EM | Admit: 2022-01-11 | Discharge: 2022-01-11 | Disposition: A | Discharge status: Home / Self Care | Payer: Medicare Other | Attending: Internal Medicine | Admitting: Internal Medicine

## 2022-01-11 DIAGNOSIS — J019 Acute sinusitis, unspecified: Secondary | ICD-10-CM

## 2022-01-11 MED ORDER — FLUTICASONE PROPIONATE 50 MCG/ACT NA SUSP: 1.0000 | Freq: Every day | NASAL | 0 refills | Status: DC

## 2022-01-11 NOTE — ED Triage Notes (Signed)
Pt c/o weakness, congestion, sore throat, and dizziness. She states her at home Covid test was negative on Tuesday. Started: Monday

## 2022-01-11 NOTE — Discharge Instructions (Addendum)
Humidifier and vapor rub use will help with nasal congestion and cough Please use medications as prescribed Over-the-counter Mucinex I am not concerned about pneumonia given your symptoms and physical exam Maintain adequate hydration Return to urgent care if symptoms worsen.

## 2022-01-11 NOTE — ED Provider Notes (Signed)
[facility]    CSN: [id number] Arrival date & time: 01/11/22  0160      History   Chief Complaint Chief Complaint  Patient presents with   Dizziness   Nasal Congestion   Sore Throat    HPI [name] is a 81 y.o. female comes to urgent care with a 4-day history of nasal congestion, sore throat, ear fullness and some dizziness.  Symptoms started insidiously and has been persistent.  She has some itchy ears.  She denies any postnasal drainage.  No shortness of breath, chest pain or chest tightness.  She tested negative on Tuesday using the home COVID test.  No sick contacts.  No ear discharge.  No ear pain.  No nausea vomiting or diarrhea.Marland Kitchen   HPI  Past Medical History:  Diagnosis Date   Cancer ([place]) 2014   kidney   Esophageal stricture    Fibromyalgia    GERD (gastroesophageal reflux disease)    Helicobacter pylori gastritis    Hypertension    Intention tremor    Lumbago    Osteoarthritis of spine    Pneumonia    hx of years ago    PUD (peptic ulcer disease)    PVD (peripheral vascular disease) ([place])     Patient Active Problem List   Diagnosis Date Noted   Acute respiratory failure with hypoxia ([place]) 05/11/2020   Right upper lobe pneumonia 05/11/2020   Hyponatremia 05/11/2020   GERD (gastroesophageal reflux disease)    Anxiety with depression    Anxiety 11/14/2016   Swelling 06/18/2016   Hypothyroidism 05/17/2016   Abnormal CT scan 12/28/2015   Chronic low back pain 04/13/2015   Cervical disc disorder with radiculopathy of cervical region 06/20/2014   Bursitis of right shoulder 05/04/2014   Insomnia 04/21/2013   Routine health maintenance 01/28/2013   HTN (hypertension), benign 01/09/2012   Memory loss or impairment 12/02/2010   Fibromyalgia 05/04/2007    Past Surgical History:  Procedure Laterality Date   [place]   CATARACT EXTRACTION Bilateral 2014   [facility]   LAPAROSCOPIC PARTIAL  NEPHRECTOMY Left April '13   [facility], Dr. Alinda Money   LUMBAR FUSION  03/2003   L4-5   [place]    OB History   No obstetric history on file.      Home Medications    Prior to Admission medications   Medication Sig Start Date End Date Taking? Authorizing Provider  fluticasone (FLONASE) 50 MCG/ACT nasal spray Place 1 spray into both nostrils daily. 01/11/22  Yes Albertine Lafoy, Myrene Galas, MD  ALPRAZolam Duanne Moron) 0.5 MG tablet Take 1 tablet (0.5 mg total) by mouth 2 (two) times daily as needed. for anxiety Patient taking differently: Take 1 mg by mouth at bedtime. 11/14/16   Plotnikov, Evie Lacks, MD  amitriptyline (ELAVIL) 25 MG tablet TAKE 1 TABLET BY MOUTH EVERY DAY AT BEDTIME Patient taking differently: Take 25 mg by mouth at bedtime. 10/23/17   Hoyt Koch, MD  amLODipine (NORVASC) 5 MG tablet Take 1 tablet (5 mg total) by mouth daily. 11/07/16   Lauree Chandler, NP  Artificial Tear Ointment (DRY EYES OP) Apply 1 drop to eye 3 (three) times daily as needed (for dry eyes).    [provider]  buPROPion (WELLBUTRIN) 75 MG tablet Take 75 mg by mouth every morning. 02/28/20   [provider]  calcium-vitamin D (OSCAL WITH D)  500-200 MG-UNIT per tablet Take 1 tablet by mouth daily.    [provider]  Collagen Hydrolysate, Bovine, POWD Take 1 Scoop by mouth daily.    [provider]  DULoxetine (CYMBALTA) 30 MG capsule Take 30 mg by mouth See admin instructions. Take 1 capsule (30 mg) combine with 1 capsule (60 mg) by mouth daily    [provider]  DULoxetine (CYMBALTA) 60 MG capsule Take 60 mg by mouth See admin instructions. Take 1 capsule (60 mg) combine with 1 capsule (30 mg) by mouth daily 04/14/18   [provider]  furosemide (LASIX) 20 MG tablet Take 1 tablet (20 mg total) by mouth daily. NEED ANNUAL VISIT FOR FURTHER REFILLS Patient taking differently: Take 20 mg by mouth daily. 04/29/17   Hoyt Koch, MD  levothyroxine (SYNTHROID, LEVOTHROID) 75 MCG tablet Take 75 mcg by mouth daily.  02/06/18   [provider]  losartan (COZAAR) 100 MG tablet Take 1 tablet (100 mg total) by mouth daily. NEED ANNUAL VISIT FOR FURTHER REFILLS 04/29/17 05/11/20  Hoyt Koch, MD  melatonin 3 MG TABS tablet Take 3 mg by mouth at bedtime.    [provider]  meloxicam (MOBIC) 7.5 MG tablet Take 7.5 mg by mouth daily.  03/25/18   [provider]  methylcellulose (CITRUCEL) oral powder 2 tablespoons daily 03/08/21   Irene Shipper, MD  metoprolol succinate (TOPROL-XL) 50 MG 24 hr tablet Take 1 tablet (50 mg total) by mouth daily. Take with or immediately following a meal. NEED ANNUAL VISIT FOR FURTHER REFILLS 04/29/17   Hoyt Koch, MD  Multiple Vitamins-Minerals (MULTIVITAMIN WITH MINERALS) tablet Take 1 tablet by mouth daily.    [provider]  omeprazole (PRILOSEC) 40 MG capsule Take 1 capsule (40 mg total) by mouth daily. NEED ANNUAL VISIT FOR FURTHER REFILLS Patient taking differently: Take 40 mg by mouth daily. 04/29/17   Hoyt Koch, MD  traMADol (ULTRAM) 50 MG tablet TAKE 1 TABLET BY MOUTH EVERY 8 HOURS AS NEEDED FOR MODERATE TO SEVERE PAIN. MAY TAKE 1 ADDITIONAL TABLET DAILY AS NEEDED FOR SEVERE PAIN Patient taking differently: Take 50 mg by mouth 2 (two) times daily. 10/24/16   Hoyt Koch, MD    Family History Family History  Problem Relation Age of Onset   Heart disease Mother    COPD Mother    Thyroid disease Mother    Diabetes Father    Heart disease Father        CABG   Stroke Father    Parkinson's disease Father    Heart disease Brother    Stroke Brother    Hyperlipidemia Brother    Diabetes Daughter    Hearing loss Brother    Crohn's disease Other        neice   Hypertension Son    Diabetes Son    Melanoma Son    Colon cancer Neg Hx    Stomach cancer Neg Hx    Esophageal cancer Neg Hx     Social  History Social History   Tobacco Use   Smoking status: Never   Smokeless tobacco: Never  Vaping Use   Vaping Use: Never used  Substance Use Topics   Alcohol use: Yes    Alcohol/week: 0.0 standard drinks    Comment: occasional glass of wine with dinner    Drug use: No     Allergies   Morphine and related   Review of Systems Review of  Systems As per HPI  Physical Exam Triage Vital Signs ED Triage Vitals  Enc Vitals Group     BP 01/11/22 0948 108/68     Pulse Rate 01/11/22 0948 62     Resp 01/11/22 0948 16     Temp 01/11/22 0948 (!) 97.5 F (36.4 C)     Temp Source 01/11/22 0948 Oral     SpO2 01/11/22 0948 97 %     Weight --      Height --      Head Circumference --      Peak Flow --      Pain Score 01/11/22 0952 7     Pain Loc --      Pain Edu? --      Excl. in [place]? --    No data found.  Updated Vital Signs BP 108/68 (BP Location: Right Arm)   Pulse 62   Temp (!) 97.5 F (36.4 C) (Oral)   Resp 16   SpO2 97%   Visual Acuity Right Eye Distance:   Left Eye Distance:   Bilateral Distance:    Right Eye Near:   Left Eye Near:    Bilateral Near:     Physical Exam Vitals and nursing note reviewed.  Constitutional:      General: She is not in acute distress.    Appearance: She is not ill-appearing.  HENT:     Right Ear: A middle ear effusion is present.     Left Ear: A middle ear effusion is present.     Nose: Congestion present.     Mouth/Throat:     Mouth: Mucous membranes are moist. Mucous membranes are pale.     Pharynx: No posterior oropharyngeal erythema.     Tonsils: No tonsillar exudate or tonsillar abscesses. 1+ on the right. 1+ on the left.  Cardiovascular:     Rate and Rhythm: Normal rate and regular rhythm.  Pulmonary:     Effort: Pulmonary effort is normal. No respiratory distress.     Breath sounds: Normal breath sounds. No wheezing, rhonchi or rales.  Neurological:     Mental Status: She is alert.     UC Treatments / Results   Labs (all labs ordered are listed, but only abnormal results are displayed) Labs Reviewed - No data to display  EKG   Radiology No results found.  Procedures Procedures (including critical care time)  Medications Ordered in UC Medications - No data to display  Initial Impression / Assessment and Plan / UC Course  I have reviewed the triage vital signs and the nursing notes.  Pertinent labs & imaging results that were available during my care of the patient were reviewed by me and considered in my medical decision making (see chart for details).     1.  Acute rhinosinusitis: Humidifier and vapor rubs use will help with nasal congestion and cough Fluticasone nasal spray Maintain adequate hydration Lung exam is unremarkable Mucinex over-the-counter Tylenol as needed for pain and/or fever Return to urgent care if symptoms worsen. No indication for COVID-19 testing given the duration of symptoms. Final Clinical Impressions(s) / UC Diagnoses   Final diagnoses:  Acute rhinosinusitis     Discharge Instructions      Humidifier and vapor rub use will help with nasal congestion and cough Please use medications as prescribed Over-the-counter Mucinex I am not concerned about pneumonia given your symptoms and physical exam Maintain adequate hydration Return to urgent care if symptoms worsen.  ED Prescriptions     Medication Sig Dispense Auth. Provider   fluticasone (FLONASE) 50 MCG/ACT nasal spray Place 1 spray into both nostrils daily. 16 g Wilber Fini, Myrene Galas, MD      PDMP not reviewed this encounter.   Chase Picket, MD 01/11/22 [phone]

## 2022-04-01 ENCOUNTER — Other Ambulatory Visit: Payer: Self-pay | Admitting: *Deleted

## 2022-04-01 DIAGNOSIS — R609 Edema, unspecified: Secondary | ICD-10-CM

## 2022-04-03 ENCOUNTER — Ambulatory Visit (HOSPITAL_COMMUNITY)
Admission: RE | Admit: 2022-04-03 | Discharge: 2022-04-03 | Disposition: A | Discharge status: Home / Self Care | Payer: Medicare Other | Source: Ambulatory Visit | Attending: Vascular Surgery | Admitting: Vascular Surgery

## 2022-04-03 DIAGNOSIS — R609 Edema, unspecified: Secondary | ICD-10-CM | POA: Diagnosis present

## 2022-04-12 ENCOUNTER — Other Ambulatory Visit: Payer: Self-pay

## 2022-04-12 ENCOUNTER — Telehealth: Payer: Self-pay

## 2022-04-12 ENCOUNTER — Emergency Department (HOSPITAL_BASED_OUTPATIENT_CLINIC_OR_DEPARTMENT_OTHER)
Admission: EM | Admit: 2022-04-12 | Discharge: 2022-04-12 | Disposition: A | Discharge status: Home / Self Care | Payer: Medicare Other | Attending: Emergency Medicine | Admitting: Emergency Medicine

## 2022-04-12 ENCOUNTER — Encounter (HOSPITAL_BASED_OUTPATIENT_CLINIC_OR_DEPARTMENT_OTHER): Payer: Self-pay

## 2022-04-12 ENCOUNTER — Emergency Department (HOSPITAL_BASED_OUTPATIENT_CLINIC_OR_DEPARTMENT_OTHER): Payer: Medicare Other

## 2022-04-12 DIAGNOSIS — Z79899 Other long term (current) drug therapy: Secondary | ICD-10-CM | POA: Insufficient documentation

## 2022-04-12 DIAGNOSIS — M79604 Pain in right leg: Secondary | ICD-10-CM | POA: Diagnosis not present

## 2022-04-12 DIAGNOSIS — R600 Localized edema: Secondary | ICD-10-CM | POA: Insufficient documentation

## 2022-04-12 DIAGNOSIS — Z85528 Personal history of other malignant neoplasm of kidney: Secondary | ICD-10-CM | POA: Insufficient documentation

## 2022-04-12 DIAGNOSIS — I10 Essential (primary) hypertension: Secondary | ICD-10-CM | POA: Diagnosis not present

## 2022-04-12 NOTE — ED Provider Notes (Signed)
[facility] DEPART[facility] Provider Note   CSN: [id number] Arrival date & time: 04/12/22  1242     History  Chief Complaint  Patient presents with   Leg Pain   Numbness    [name] is a 81 y.o. female.  Patient being followed by vascular surgery about a week ago did have work-up and Doppler studies that showed some venous insufficiency in the right leg.  Patient with some swelling to both legs but more swelling on the right.  No skin breakdown.  Patient stated that what is new is calf pain.  And then this morning when she tried to walk it hurt to walk on her foot no fall or injury.  Mentioned that there was tingling but no true numbness.  Past medical history significant for intention tremor peptic ulcer disease peripheral vascular disease fibromyalgia hypertension.  Surgical history significant for abdominal hysterectomy lumbar fusion laparoscopic partial nephrectomy on the left in 2013 for history of kidney cancer.       Home Medications Prior to Admission medications   Medication Sig Start Date End Date Taking? Authorizing Provider  ALPRAZolam Duanne Moron) 0.5 MG tablet Take 1 tablet (0.5 mg total) by mouth 2 (two) times daily as needed. for anxiety Patient taking differently: Take 1 mg by mouth at bedtime. 11/14/16   Plotnikov, Evie Lacks, MD  amitriptyline (ELAVIL) 25 MG tablet TAKE 1 TABLET BY MOUTH EVERY DAY AT BEDTIME Patient taking differently: Take 25 mg by mouth at bedtime. 10/23/17   Hoyt Koch, MD  amLODipine (NORVASC) 5 MG tablet Take 1 tablet (5 mg total) by mouth daily. 11/07/16   Lauree Chandler, NP  Artificial Tear Ointment (DRY EYES OP) Apply 1 drop to eye 3 (three) times daily as needed (for dry eyes).    [provider]  buPROPion (WELLBUTRIN) 75 MG tablet Take 75 mg by mouth every morning. 02/28/20   [provider]  calcium-vitamin D (OSCAL WITH D) 500-200 MG-UNIT per tablet Take 1 tablet by mouth daily.     [provider]  Collagen Hydrolysate, Bovine, POWD Take 1 Scoop by mouth daily.    [provider]  DULoxetine (CYMBALTA) 30 MG capsule Take 30 mg by mouth See admin instructions. Take 1 capsule (30 mg) combine with 1 capsule (60 mg) by mouth daily    [provider]  DULoxetine (CYMBALTA) 60 MG capsule Take 60 mg by mouth See admin instructions. Take 1 capsule (60 mg) combine with 1 capsule (30 mg) by mouth daily 04/14/18   [provider]  fluticasone (FLONASE) 50 MCG/ACT nasal spray Place 1 spray into both nostrils daily. 01/11/22   Lamptey, Myrene Galas, MD  furosemide (LASIX) 20 MG tablet Take 1 tablet (20 mg total) by mouth daily. NEED ANNUAL VISIT FOR FURTHER REFILLS Patient taking differently: Take 20 mg by mouth daily. 04/29/17   Hoyt Koch, MD  levothyroxine (SYNTHROID, LEVOTHROID) 75 MCG tablet Take 75 mcg by mouth daily.  02/06/18   [provider]  losartan (COZAAR) 100 MG tablet Take 1 tablet (100 mg total) by mouth daily. NEED ANNUAL VISIT FOR FURTHER REFILLS 04/29/17 05/11/20  Hoyt Koch, MD  melatonin 3 MG TABS tablet Take 3 mg by mouth at bedtime.    [provider]  meloxicam (MOBIC) 7.5 MG tablet Take 7.5 mg by mouth daily.  03/25/18   [provider]  methylcellulose (CITRUCEL) oral powder 2 tablespoons daily 03/08/21   Irene Shipper, MD  metoprolol succinate (TOPROL-XL) 50 MG 24 hr tablet Take 1 tablet (50 mg total) by mouth daily. Take with or immediately following a meal. NEED ANNUAL VISIT FOR FURTHER REFILLS 04/29/17   Hoyt Koch, MD  Multiple Vitamins-Minerals (MULTIVITAMIN WITH MINERALS) tablet Take 1 tablet by mouth daily.    [provider]  omeprazole (PRILOSEC) 40 MG capsule Take 1 capsule (40 mg total) by mouth daily. NEED ANNUAL VISIT FOR FURTHER REFILLS Patient taking differently: Take 40 mg by mouth daily. 04/29/17   Hoyt Koch, MD  traMADol (ULTRAM) 50 MG tablet  TAKE 1 TABLET BY MOUTH EVERY 8 HOURS AS NEEDED FOR MODERATE TO SEVERE PAIN. MAY TAKE 1 ADDITIONAL TABLET DAILY AS NEEDED FOR SEVERE PAIN Patient taking differently: Take 50 mg by mouth 2 (two) times daily. 10/24/16   Hoyt Koch, MD      Allergies    Morphine and related    Review of Systems   Review of Systems  Constitutional:  Negative for chills and fever.  HENT:  Negative for rhinorrhea and sore throat.   Eyes:  Negative for visual disturbance.  Respiratory:  Negative for cough and shortness of breath.   Cardiovascular:  Positive for leg swelling. Negative for chest pain.  Gastrointestinal:  Negative for abdominal pain, diarrhea, nausea and vomiting.  Genitourinary:  Negative for dysuria.  Musculoskeletal:  Negative for back pain and neck pain.  Skin:  Negative for rash.  Neurological:  Negative for dizziness, weakness, light-headedness, numbness and headaches.  Hematological:  Does not bruise/bleed easily.  Psychiatric/Behavioral:  Negative for confusion.     Physical Exam Updated Vital Signs BP (!) 157/78   Pulse (!) 56   Temp 98 F (36.7 C)   Resp 18   Ht 1.575 m ('5\' 2"'$ )   Wt 64.9 kg   SpO2 95%   BMI 26.16 kg/m  Physical Exam Vitals and nursing note reviewed.  Constitutional:      General: She is not in acute distress.    Appearance: Normal appearance. She is well-developed. She is not ill-appearing.  HENT:     Head: Normocephalic and atraumatic.  Eyes:     Conjunctiva/sclera: Conjunctivae normal.  Cardiovascular:     Rate and Rhythm: Normal rate and regular rhythm.     Heart sounds: No murmur heard. Pulmonary:     Effort: Pulmonary effort is normal. No respiratory distress.     Breath sounds: Normal breath sounds.  Abdominal:     Palpations: Abdomen is soft.     Tenderness: There is no abdominal tenderness.  Musculoskeletal:        General: No swelling or signs of injury. Normal range of motion.     Cervical back: Neck supple.     Right lower  leg: Edema present.     Left lower leg: No edema.     Comments: Right lower extremity with increased swelling slightly compared to the left.  Dorsalis pedis pulse 2+.  No calf tenderness.  Cap refill very normal.  Sensation intact.  No tenderness to palpation to the foot ankle tib-fib or knee area.  Skin:    General: Skin is warm and dry.     Capillary Refill: Capillary refill takes less than 2 seconds.  Neurological:     General: No focal deficit present.     Mental Status: She is alert and oriented to person, place, and time.     Cranial Nerves: No cranial nerve deficit.     Sensory: No  sensory deficit.     Motor: No weakness.  Psychiatric:        Mood and Affect: Mood normal.     ED Results / Procedures / Treatments   Labs (all labs ordered are listed, but only abnormal results are displayed) Labs Reviewed - No data to display  EKG None  Radiology US Venous Img Lower Right (DVT Study)  Result Date: 04/12/2022 CLINICAL DATA:  Edema of the lower extremity. EXAM: RIGHT LOWER EXTREMITY VENOUS DOPPLER ULTRASOUND TECHNIQUE: Gray-scale sonography with compression, as well as color and duplex ultrasound, were performed to evaluate the deep venous system(s) from the level of the common femoral vein through the popliteal and proximal calf veins. COMPARISON:  None Available. FINDINGS: VENOUS Normal compressibility of the common femoral, superficial femoral, and popliteal veins, as well as the visualized calf veins. Visualized portions of profunda femoral vein and great saphenous vein unremarkable. No filling defects to suggest DVT on grayscale or color Doppler imaging. Doppler waveforms show normal direction of venous flow, normal respiratory plasticity and response to augmentation. Limited views of the contralateral common femoral vein are unremarkable. OTHER Edema noted throughout the lower leg. Limitations: none IMPRESSION: Negative for sonographic evidence of DVT in the RIGHT lower extremity.  Edema in the RIGHT lower leg. Electronically Signed   By: Zetta Bills M.D.   On: 04/12/2022 13:52    Procedures Procedures    Medications Ordered in ED Medications - No data to display  ED Course/ Medical Decision Making/ A&P                           Medical Decision Making Patient's work-up.  Doppler study was repeated no evidence of DVT.  Patient states she has pain medicine that she can take at home.  And that she has follow-up with vascular surgery.  Due to the venous insufficiency.  Patient was just concerned because of the calf pain.  Patient stable for discharge home. Final Clinical Impression(s) / ED Diagnoses Final diagnoses:  Right leg pain    Rx / DC Orders ED Discharge Orders     None         Fredia Sorrow, MD 04/12/22 1457

## 2022-04-12 NOTE — Telephone Encounter (Signed)
Pt called stating that since her [place] study was performed, she's been experiencing aching and pain in her RLE. She c/o a throbbing foot and the bottom of her foot hurts with ambulation.  Reviewed pt's chart, returned call for clarification, two identifiers used. Informed pt that she should reach out to PCP since she should not be having prolonged discomfort from the [place] study. No appts available to move to earlier date or time. Pt became frustrated and was concerned for DVT. Informed her that her US showed no evidence of DVT and gave her symptoms to monitor and how to treat her swelling and pain. Pt to seek urgent or emergent care if symptoms worsen. Confirmed understanding.

## 2022-04-12 NOTE — ED Triage Notes (Signed)
Patient states she woke up this AM her right leg was in pain and having foot pain. Patient has swelling in her BLE and is seeing vascular for issues.

## 2022-04-12 NOTE — Discharge Instructions (Signed)
Take pain medicine as needed.  Ultrasound of the leg without evidence of any blood clots.  Continue your follow-up with vascular surgery.

## 2022-04-22 NOTE — Progress Notes (Unsigned)
[facility] [place]  ASSESSMENT / PLAN: [name] is a 81 y.o. female with chronic venous insufficiency of bilateral lower extremities (R>L) causing edema, dyspigmentation (C4a disease).  Venous duplex is significant for deep and superficial vein reflux. Marginal greater saphenous vein about the knee for access. Recommend compression and elevation for symptomatic relief. Follow up in three months to discuss saphenous vein ablation. Counseled patient that ablation will likely not resolve all her symptoms.   CHIEF COMPLAINT: swelling  HISTORY OF PRESENT ILLNESS: [name] is a 81 y.o. female presents to clinic for evaluation of bilateral lower extremity swelling, right worse than left.  The patient reports cramping discomfort in her calves associated with a feeling of fatigue and heaviness.  Her symptoms are worse when her swelling is at its worst.  She has some color changes to her skin about her distal third of the calf and some inflammation about the perimalleolar skin.  She has no active ulceration.  She has been using compression therapy fairly regularly.  VASCULAR SURGICAL HISTORY: none  VASCULAR RISK FACTORS: Negative history of stroke / transient ischemic attack. Negative history of coronary artery disease.  Negative history of diabetes mellitus.  Negative history of smoking.  Positive history of hypertension.  Negative history of chronic kidney disease.  Negative history of chronic obstructive pulmonary disease.  FUNCTIONAL STATUS: ECOG performance status: (0) Fully active, able to carry on all predisease performance without restriction Ambulatory status: Ambulatory within the community without limits  VENOUS CLINICAL SEVERITY SCORE: 8 - severe disease and is at risk for progression. Intervention warranted.   Past Medical History:  Diagnosis Date   Cancer ([place]) 2014   kidney   Esophageal stricture    Fibromyalgia    GERD  (gastroesophageal reflux disease)    Helicobacter pylori gastritis    Hypertension    Intention tremor    Lumbago    Osteoarthritis of spine    Pneumonia    hx of years ago    PUD (peptic ulcer disease)    PVD (peripheral vascular disease) ([place])     Past Surgical History:  Procedure Laterality Date   ABDOMINAL HYSTERECTOMY  1985   BREAST ENHANCEMENT SURGERY  1974   CATARACT EXTRACTION Bilateral 2014   shapiro   LAPAROSCOPIC PARTIAL NEPHRECTOMY Left April '13   [facility], Dr. Alinda Money   LUMBAR FUSION  03/2003   L4-5   TONSILLECTOMY  1958   TUBAL LIGATION  1984    Family History  Problem Relation Age of Onset   Heart disease Mother    COPD Mother    Thyroid disease Mother    Diabetes Father    Heart disease Father        CABG   Stroke Father    Parkinson's disease Father    Heart disease Brother    Stroke Brother    Hyperlipidemia Brother    Diabetes Daughter    Hearing loss Brother    Crohn's disease Other        neice   Hypertension Son    Diabetes Son    Melanoma Son    Colon cancer Neg Hx    Stomach cancer Neg Hx    Esophageal cancer Neg Hx     Social History   Socioeconomic History   Marital status: Widowed    Spouse name: Not on file   Number of children: 2   Years of education: BA   Highest education level: Not on file  Occupational History   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   Occupation: on site [employer] 6-11  Tobacco Use   Smoking status: Never   Smokeless tobacco: Never  Vaping Use   Vaping Use: Never used  Substance and Sexual Activity   Alcohol use: Yes    Alcohol/week: 7.0 standard drinks of alcohol    Types: 7 Glasses of wine per week    Comment: occasional glass of wine with dinner    Drug use: No   Sexual activity: Not Currently  Other Topics Concern   Not on file  Social History Narrative   Lives alone   Right Handed   Drinks 2 cups coffee daily   Social Determinants of Health   Financial Resource Strain: Not on file   Food Insecurity: Not on file  Transportation Needs: Not on file  Physical Activity: Not on file  Stress: Not on file  Social Connections: Not on file  Intimate Partner Violence: Not on file    Allergies  Allergen Reactions   Morphine And Related Itching and Rash    Given in IV.    Current Outpatient Medications  Medication Sig Dispense Refill   ALPRAZolam (XANAX) 0.5 MG tablet Take 1 tablet (0.5 mg total) by mouth 2 (two) times daily as needed. for anxiety (Patient taking differently: Take 1 mg by mouth at bedtime.) 60 tablet 1   amitriptyline (ELAVIL) 25 MG tablet TAKE 1 TABLET BY MOUTH EVERY DAY AT BEDTIME (Patient taking differently: Take 25 mg by mouth at bedtime.) 90 tablet 0   amLODipine (NORVASC) 5 MG tablet Take 1 tablet (5 mg total) by mouth daily.     Artificial Tear Ointment (DRY EYES OP) Apply 1 drop to eye 3 (three) times daily as needed (for dry eyes).     buPROPion (WELLBUTRIN) 75 MG tablet Take 75 mg by mouth every morning.     calcium-vitamin D (OSCAL WITH D) 500-200 MG-UNIT per tablet Take 1 tablet by mouth daily.     Collagen Hydrolysate, Bovine, POWD Take 1 Scoop by mouth daily.     DULoxetine (CYMBALTA) 30 MG capsule Take 30 mg by mouth See admin instructions. Take 1 capsule (30 mg) combine with 1 capsule (60 mg) by mouth daily     DULoxetine (CYMBALTA) 60 MG capsule Take 60 mg by mouth See admin instructions. Take 1 capsule (60 mg) combine with 1 capsule (30 mg) by mouth daily  1   furosemide (LASIX) 20 MG tablet Take 1 tablet (20 mg total) by mouth daily. NEED ANNUAL VISIT FOR FURTHER REFILLS (Patient taking differently: Take 20 mg by mouth daily.) 90 tablet 0   levothyroxine (SYNTHROID) 88 MCG tablet Take 88 mcg by mouth every morning.     meloxicam (MOBIC) 7.5 MG tablet Take 7.5 mg by mouth daily.   2   methylcellulose (CITRUCEL) oral powder 2 tablespoons daily     metoprolol succinate (TOPROL-XL) 50 MG 24 hr tablet Take 1 tablet (50 mg total) by mouth daily.  Take with or immediately following a meal. NEED ANNUAL VISIT FOR FURTHER REFILLS 90 tablet 0   Multiple Vitamins-Minerals (MULTIVITAMIN WITH MINERALS) tablet Take 1 tablet by mouth daily.     omeprazole (PRILOSEC) 40 MG capsule Take 1 capsule (40 mg total) by mouth daily. NEED ANNUAL VISIT FOR FURTHER REFILLS (Patient taking differently: Take 40 mg by mouth daily.) 90 capsule 0   traMADol (ULTRAM) 50 MG tablet TAKE 1 TABLET BY MOUTH EVERY 8 HOURS AS NEEDED  FOR MODERATE TO SEVERE PAIN. MAY TAKE 1 ADDITIONAL TABLET DAILY AS NEEDED FOR SEVERE PAIN (Patient taking differently: Take 50 mg by mouth 2 (two) times daily.) 100 tablet 0   losartan (COZAAR) 100 MG tablet Take 1 tablet (100 mg total) by mouth daily. NEED ANNUAL VISIT FOR FURTHER REFILLS 90 tablet 0   No current facility-administered medications for this visit.    PHYSICAL EXAM Vitals:   04/23/22 1051  BP: 133/70  Pulse: (!) 58  Resp: 20  Temp: 98.4 F (36.9 C)  SpO2: 100%  Weight: 150 lb (68 kg)  Height: '5\' 2"'$  (1.575 m)    Constitutional: Well-appearing elderly woman in no distress Cardiac: Regular rate and rhythm.  Respiratory:  unlabored. Peripheral vascular: 2+ dorsalis pedis pulses bilaterally.  2+ pitting edema from the feet to the knees bilaterally.  Dyspigmentation about the perimalleoli or skin bilaterally.  Scattered reticular veins about bilateral lower extremities.  PERTINENT LABORATORY AND RADIOLOGIC DATA  Most recent CBC    Latest Ref Rng & Units 05/12/2020    3:25 AM 05/11/2020   12:03 PM 06/18/2016   12:12 PM  CBC  WBC 4.0 - 10.5 K/uL 8.3  8.0  6.9   Hemoglobin 12.0 - 15.0 g/dL 10.5  10.7  12.2   Hematocrit 36.0 - 46.0 % 31.8  32.5  36.2   Platelets 150 - 400 K/uL 227  233  313.0      Most recent CMP    Latest Ref Rng & Units 05/17/2020    9:21 AM 05/16/2020    6:03 AM 05/15/2020    4:04 AM  CMP  Glucose 70 - 99 mg/dL 129  106  103   BUN 8 - 23 mg/dL '8  6  6   '$ Creatinine 0.44 - 1.00 mg/dL 0.60  0.48   0.48   Sodium 135 - 145 mmol/L 136  137  138   Potassium 3.5 - 5.1 mmol/L 3.8  3.7  3.6   Chloride 98 - 111 mmol/L 97  101  106   CO2 22 - 32 mmol/L '26  27  24   '$ Calcium 8.9 - 10.3 mg/dL 9.2  8.6  8.4     Renal function CrCl cannot be calculated (Patient's most recent lab result is older than the maximum 21 days allowed.).  Hgb A1c MFr Bld (%)  Date Value  05/04/2009 5.8    LDL Cholesterol  Date Value Ref Range Status  04/27/2015 105 (H) 0 - 99 mg/dL Final     Right venous reflux study:  - No evidence of deep vein thrombosis seen in the right lower extremity,  from the common femoral through the popliteal veins.  - No evidence of superficial venous thrombosis in the right lower  extremity.  - Venous reflux is noted in the right common femoral vein.  - Venous reflux is noted in the right sapheno-femoral junction.  - Venous reflux is noted in the right greater saphenous vein in the thigh.  - Venous reflux is noted in the right greater saphenous vein in the calf.  - Venous reflux is noted in the right femoral vein.     - Incidental finding of complex heterogeneous cystic structure in the  Right Popliteal fossa. 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   Yevonne Aline. Stanford Breed, MD [facility] Phone Number: [phone] 04/23/2022 11:18 AM  Total time spent on preparing this encounter including chart review, data review, collecting history, examining  the patient, coordinating care for this new patient, 60 minutes.  Portions of this report may have been transcribed using voice recognition software.  Every effort has been made to ensure accuracy; however, inadvertent computerized transcription errors may still be present.

## 2022-04-23 ENCOUNTER — Ambulatory Visit: Payer: Medicare Other | Admitting: Vascular Surgery

## 2022-04-23 ENCOUNTER — Encounter: Payer: Self-pay | Admitting: Vascular Surgery

## 2022-04-23 VITALS — BP 133/70 | HR 58 | Temp 98.4°F | Resp 20 | Ht 62.0 in | Wt 150.0 lb

## 2022-04-23 DIAGNOSIS — I872 Venous insufficiency (chronic) (peripheral): Secondary | ICD-10-CM

## 2022-04-30 ENCOUNTER — Observation Stay (HOSPITAL_COMMUNITY): Payer: Medicare Other

## 2022-04-30 ENCOUNTER — Other Ambulatory Visit: Payer: Self-pay

## 2022-04-30 ENCOUNTER — Observation Stay (HOSPITAL_BASED_OUTPATIENT_CLINIC_OR_DEPARTMENT_OTHER)
Admission: EM | Admit: 2022-04-30 | Discharge: 2022-05-01 | Disposition: A | Discharge status: Home / Self Care | Payer: Medicare Other | Attending: Internal Medicine | Admitting: Internal Medicine

## 2022-04-30 ENCOUNTER — Encounter (HOSPITAL_BASED_OUTPATIENT_CLINIC_OR_DEPARTMENT_OTHER): Payer: Self-pay | Admitting: Emergency Medicine

## 2022-04-30 ENCOUNTER — Other Ambulatory Visit (HOSPITAL_COMMUNITY): Payer: Medicare Other

## 2022-04-30 ENCOUNTER — Emergency Department (HOSPITAL_BASED_OUTPATIENT_CLINIC_OR_DEPARTMENT_OTHER): Payer: Medicare Other

## 2022-04-30 DIAGNOSIS — R278 Other lack of coordination: Secondary | ICD-10-CM

## 2022-04-30 DIAGNOSIS — R4701 Aphasia: Principal | ICD-10-CM

## 2022-04-30 DIAGNOSIS — Z79899 Other long term (current) drug therapy: Secondary | ICD-10-CM | POA: Diagnosis not present

## 2022-04-30 DIAGNOSIS — F419 Anxiety disorder, unspecified: Secondary | ICD-10-CM | POA: Diagnosis present

## 2022-04-30 DIAGNOSIS — K219 Gastro-esophageal reflux disease without esophagitis: Secondary | ICD-10-CM | POA: Diagnosis present

## 2022-04-30 DIAGNOSIS — E039 Hypothyroidism, unspecified: Secondary | ICD-10-CM | POA: Diagnosis present

## 2022-04-30 DIAGNOSIS — F03A4 Unspecified dementia, mild, with anxiety: Secondary | ICD-10-CM | POA: Insufficient documentation

## 2022-04-30 DIAGNOSIS — Z85528 Personal history of other malignant neoplasm of kidney: Secondary | ICD-10-CM | POA: Diagnosis not present

## 2022-04-30 DIAGNOSIS — G459 Transient cerebral ischemic attack, unspecified: Secondary | ICD-10-CM | POA: Diagnosis not present

## 2022-04-30 DIAGNOSIS — I10 Essential (primary) hypertension: Secondary | ICD-10-CM | POA: Diagnosis not present

## 2022-04-30 DIAGNOSIS — R413 Other amnesia: Secondary | ICD-10-CM | POA: Diagnosis present

## 2022-04-30 DIAGNOSIS — R488 Other symbolic dysfunctions: Secondary | ICD-10-CM | POA: Diagnosis not present

## 2022-04-30 HISTORY — DX: Other amnesia: R41.3

## 2022-04-30 LAB — CBC WITH DIFFERENTIAL/PLATELET
Abs Immature Granulocytes: 0.01 10*3/uL (ref 0.00–0.07)
Basophils Absolute: 0.1 10*3/uL (ref 0.0–0.1)
Basophils Relative: 1 %
Eosinophils Absolute: 0.2 10*3/uL (ref 0.0–0.5)
Eosinophils Relative: 3 %
HCT: 40.2 % (ref 36.0–46.0)
Hemoglobin: 13.2 g/dL (ref 12.0–15.0)
Immature Granulocytes: 0 %
Lymphocytes Relative: 29 %
Lymphs Abs: 1.8 10*3/uL (ref 0.7–4.0)
MCH: 30.6 pg (ref 26.0–34.0)
MCHC: 32.8 g/dL (ref 30.0–36.0)
MCV: 93.1 fL (ref 80.0–100.0)
Monocytes Absolute: 0.5 10*3/uL (ref 0.1–1.0)
Monocytes Relative: 8 %
Neutro Abs: 3.6 10*3/uL (ref 1.7–7.7)
Neutrophils Relative %: 59 %
Platelets: 266 10*3/uL (ref 150–400)
RBC: 4.32 MIL/uL (ref 3.87–5.11)
RDW: 12.8 % (ref 11.5–15.5)
WBC: 6.1 10*3/uL (ref 4.0–10.5)
nRBC: 0 % (ref 0.0–0.2)

## 2022-04-30 LAB — BASIC METABOLIC PANEL
Anion gap: 9 (ref 5–15)
BUN: 21 mg/dL (ref 8–23)
CO2: 25 mmol/L (ref 22–32)
Calcium: 9.2 mg/dL (ref 8.9–10.3)
Chloride: 102 mmol/L (ref 98–111)
Creatinine, Ser: 0.7 mg/dL (ref 0.44–1.00)
GFR, Estimated: >60 mL/min (ref >60)
Glucose, Bld: 102 mg/dL — ABNORMAL HIGH (ref 70–99)
Potassium: 3.9 mmol/L (ref 3.5–5.1)
Sodium: 136 mmol/L (ref 135–145)

## 2022-04-30 LAB — URINALYSIS, ROUTINE W REFLEX MICROSCOPIC
Bilirubin Urine: NEGATIVE
Glucose, UA: NEGATIVE mg/dL
Hgb urine dipstick: NEGATIVE
Ketones, ur: NEGATIVE mg/dL
Leukocytes,Ua: NEGATIVE
Nitrite: NEGATIVE
Protein, ur: NEGATIVE mg/dL
Specific Gravity, Urine: 1.015 (ref 1.005–1.030)
pH: 7 (ref 5.0–8.0)

## 2022-04-30 LAB — TROPONIN I (HIGH SENSITIVITY)
Troponin I (High Sensitivity): 3 ng/L (ref <18)
Troponin I (High Sensitivity): 2 ng/L (ref <18)

## 2022-04-30 LAB — HIV ANTIBODY (ROUTINE TESTING W REFLEX): HIV Screen 4th Generation wRfx: NONREACTIVE

## 2022-04-30 LAB — HEMOGLOBIN A1C
Hgb A1c MFr Bld: 5.7 % — ABNORMAL HIGH (ref 4.8–5.6)
Mean Plasma Glucose: 116.89 mg/dL

## 2022-04-30 LAB — TSH: TSH: 2.444 u[IU]/mL (ref 0.350–4.500)

## 2022-04-30 LAB — AMMONIA: Ammonia: 15 umol/L (ref 9–35)

## 2022-04-30 LAB — VITAMIN B12: Vitamin B-12: 2482 pg/mL — ABNORMAL HIGH (ref 180–914)

## 2022-04-30 MED ORDER — IOHEXOL 350 MG/ML SOLN
80.0000 mL | Freq: Once | INTRAVENOUS | Status: AC | PRN
  Administered 2022-04-30: 80 mL via INTRAVENOUS

## 2022-04-30 MED ORDER — ACETAMINOPHEN 325 MG PO TABS
650.0000 mg | ORAL_TABLET | ORAL | Status: DC | PRN
  Filled 2022-04-30: qty 2

## 2022-04-30 MED ORDER — ALPRAZOLAM 0.5 MG PO TABS
1.0000 mg | ORAL_TABLET | Freq: Every day | ORAL | Status: DC
  Administered 2022-04-30: 1 mg via ORAL
  Filled 2022-04-30: qty 2

## 2022-04-30 MED ORDER — AMITRIPTYLINE HCL 25 MG PO TABS
25.0000 mg | ORAL_TABLET | Freq: Every day | ORAL | Status: DC
  Administered 2022-04-30: 25 mg via ORAL
  Filled 2022-04-30: qty 1

## 2022-04-30 MED ORDER — SENNOSIDES-DOCUSATE SODIUM 8.6-50 MG PO TABS: 1.0000 | ORAL_TABLET | Freq: Every evening | ORAL | Status: DC | PRN

## 2022-04-30 MED ORDER — ASPIRIN 81 MG PO TBEC
81.0000 mg | DELAYED_RELEASE_TABLET | Freq: Every day | ORAL | Status: DC
  Administered 2022-05-01: 81 mg via ORAL
  Filled 2022-04-30: qty 1

## 2022-04-30 MED ORDER — PANTOPRAZOLE SODIUM 40 MG PO TBEC
40.0000 mg | DELAYED_RELEASE_TABLET | Freq: Every day | ORAL | Status: DC
  Administered 2022-05-01: 40 mg via ORAL
  Filled 2022-04-30: qty 1

## 2022-04-30 MED ORDER — TRAMADOL HCL 50 MG PO TABS
50.0000 mg | ORAL_TABLET | Freq: Two times a day (BID) | ORAL | Status: DC
  Administered 2022-04-30 – 2022-05-01 (×2): 50 mg via ORAL
  Filled 2022-04-30 (×2): qty 1

## 2022-04-30 MED ORDER — ACETAMINOPHEN 650 MG RE SUPP: 650.0000 mg | RECTAL | Status: DC | PRN

## 2022-04-30 MED ORDER — STROKE: EARLY STAGES OF RECOVERY BOOK
Freq: Once | Status: AC
  Filled 2022-04-30: qty 1

## 2022-04-30 MED ORDER — DULOXETINE HCL 30 MG PO CPEP
90.0000 mg | ORAL_CAPSULE | Freq: Every day | ORAL | Status: DC
  Administered 2022-05-01: 90 mg via ORAL
  Filled 2022-04-30: qty 3

## 2022-04-30 MED ORDER — ACETAMINOPHEN 160 MG/5ML PO SOLN: 650.0000 mg | ORAL | Status: DC | PRN

## 2022-04-30 MED ORDER — DULOXETINE HCL 60 MG PO CPEP: 60.0000 mg | ORAL_CAPSULE | ORAL | Status: DC

## 2022-04-30 MED ORDER — SODIUM CHLORIDE 0.9 % IV SOLN: INTRAVENOUS | Status: DC

## 2022-04-30 MED ORDER — LEVOTHYROXINE SODIUM 88 MCG PO TABS
88.0000 ug | ORAL_TABLET | Freq: Every morning | ORAL | Status: DC
  Administered 2022-05-01: 88 ug via ORAL
  Filled 2022-04-30: qty 1

## 2022-04-30 NOTE — Assessment & Plan Note (Signed)
TSH appears to be within normal limits continue Synthroid 88 mcg a day

## 2022-04-30 NOTE — H&P (Signed)
[name] XTA:[id number] DOB: 17-Apr-1941 DOA: 04/30/2022     PCP: Reynold Bowen, MD   Outpatient Specialists:     NEurology  Dr. Leonie Man    Patient arrived to ER on 04/30/22 at 1041 Referred by Attending No att. providers found   Patient coming from:    home Lives alone,        Chief Complaint:   Chief Complaint  Patient presents with   Altered Mental Status    HPI: [name] is a 81 y.o. female with medical history significant of HTN mild dementia, hypothyroidism, and anxiety    Presented with   word finding difficulty and dysgraphia  Daughter noticed that yesterday at around 84 AM her mother was having trouble speaking very of word finding difficulty also patient noticed that when she was riding the mail she was writing gibberish.  When she got a phone call from a friend they could not understand the email that she wrote This morning when she woke up she tried to ride a checked and noticed that she had trouble her writing was off more scribble like although her speech has improved At baseline patient lives alone unable to take care of self.  She has been followed by neurology for mild dementia for 5 years.  Also suffers underlying anxiety. Have not had any fevers or chills no cough patient does endorse headaches   Regarding pertinent Chronic problems:       HTN on Norvasc, Toprol, Cozaar, Lasix as needed        Hypothyroidism:  Lab Results  Component Value Date   TSH 2.444 04/30/2022   on synthroid       Dementia - mild    Anxiety on alprazolam and elavil  While in ER: Initial CT head unremarkable  Neurology was consulted who recommended further admission and MRI     CT HEAD   NON acute  CXR -  Ordered  Following Medications were ordered in ER: Medications - No data to display  _______________________________________________________ ER Provider Called:   Neurology  Dr. Curly Shores They Recommend admit to medicine  Will see on arrival    ED Triage Vitals  Enc Vitals Group     BP 04/30/22 1101 124/68     Pulse Rate 04/30/22 1101 (!) 57     Resp 04/30/22 1101 18     Temp 04/30/22 1101 98.2 F (36.8 C)     Temp Source 04/30/22 1101 Oral     SpO2 04/30/22 1101 98 %     Weight 04/30/22 1104 145 lb (65.8 kg)     Height 04/30/22 1104 '5\' 2"'$  (1.575 m)     Head Circumference --      Peak Flow --      Pain Score 04/30/22 1104 0     Pain Loc --      Pain Edu? --      Excl. in [place]? --   TMAX(24)@     _________________________________________ Significant initial  Findings: Abnormal Labs Reviewed  BASIC METABOLIC PANEL - Abnormal; Notable for the following components:      Result Value   Glucose, Bld 102 (*)    All other components within normal limits     _________________________ Troponin 3 -2 ECG: Ordered    The recent clinical data is shown below. Vitals:   04/30/22 1830 04/30/22 1857 04/30/22 1900 04/30/22 2049  BP: 125/63  (!) 121/57 (!) 145/57  Pulse: 60  84 (!) 53  Resp: '16  16 18  '$ Temp:  98 F (36.7 C)  98 F (36.7 C)  TempSrc:    Oral  SpO2: 98%  96% 91%  Weight:      Height:          WBC     Component Value Date/Time   WBC 6.1 04/30/2022 1247   LYMPHSABS 1.8 04/30/2022 1247   MONOABS 0.5 04/30/2022 1247   EOSABS 0.2 04/30/2022 1247   BASOSABS 0.1 04/30/2022 1247       UA   no evidence of UTI      Urine analysis:    Component Value Date/Time   COLORURINE YELLOW 04/30/2022 [place] 04/30/2022 1224   LABSPEC 1.015 04/30/2022 1224   PHURINE 7.0 04/30/2022 1224   GLUCOSEU NEGATIVE 04/30/2022 1224   GLUCOSEU NEGATIVE 05/06/2016 1708   HGBUR NEGATIVE 04/30/2022 [place] 04/30/2022 1224   KETONESUR NEGATIVE 04/30/2022 1224   PROTEINUR NEGATIVE 04/30/2022 1224   UROBILINOGEN 0.2 05/06/2016 1708   NITRITE NEGATIVE 04/30/2022 1224   LEUKOCYTESUR NEGATIVE 04/30/2022 1224     _______________________________________________ Hospitalist was called for  admission for   Expressive aphasia    Dysgraphia     The following Work up has been ordered so far:  Orders Placed This Encounter  Procedures   Critical Care   CT Head Wo Contrast   MR BRAIN WO CONTRAST   CBC with Differential   Basic metabolic panel   TSH   Urinalysis, Routine w reflex microscopic   Cardiac monitoring   Consult to neurology   Consult to hospitalist   Place in observation (patient's expected length of stay will be less than 2 midnights)     OTHER Significant initial  Findings:  labs showing:    Recent Labs  Lab 04/30/22 1247  NA 136  K 3.9  CO2 25  GLUCOSE 102*  BUN 21  CREATININE 0.70  CALCIUM 9.2    Cr   stable,    Lab Results  Component Value Date   CREATININE 0.70 04/30/2022   CREATININE 0.60 05/17/2020   CREATININE 0.48 05/16/2020       Plt: Lab Results  Component Value Date   PLT 266 04/30/2022       COVID-19 Labs  No results for input(s): "DDIMER", "FERRITIN", "LDH", "CRP" in the last 72 hours.  Lab Results  Component Value Date   SARSCOV2NAA NEGATIVE 05/11/2020        Recent Labs  Lab 04/30/22 1247  WBC 6.1  NEUTROABS 3.6  HGB 13.2  HCT 40.2  MCV 93.1  PLT 266    HG/HCT  stable,       Component Value Date/Time   HGB 13.2 04/30/2022 1247   HCT 40.2 04/30/2022 1247   MCV 93.1 04/30/2022 1247         Cultures:    Component Value Date/Time   SDES BLOOD RIGHT ANTECUBITAL 05/11/2020 2210   SPECREQUEST  05/11/2020 2210    BOTTLES DRAWN AEROBIC AND ANAEROBIC Blood Culture adequate volume   CULT  05/11/2020 2210    NO GROWTH 5 DAYS Performed at [facility], [place] [address], [place], [place] [address]    REPTSTATUS 05/16/2020 FINAL 05/11/2020 2210     Radiological Exams on Admission: CT Head Wo Contrast  Result Date: 04/30/2022 CLINICAL DATA:  Altered mentation writing, and gait EXAM: CT HEAD WITHOUT CONTRAST TECHNIQUE: Contiguous axial images were obtained from the base of the skull through  the  vertex without intravenous contrast. RADIATION DOSE REDUCTION: This exam was performed according to the departmental dose-optimization program which includes automated exposure control, adjustment of the mA and/or kV according to patient size and/or use of iterative reconstruction technique. COMPARISON:  05/11/2020 CT head, correlation is also made with MRI head 08/07/2020 FINDINGS: Brain: No evidence of acute infarction, hemorrhage, mass, mass effect, or midline shift. No hydrocephalus or extra-axial fluid collection. Normal cerebral volume for age. Periventricular white matter changes, likely the sequela of chronic small vessel ischemic disease. Vascular: No hyperdense vessel. Skull: Stable small bilateral skull exostoses. Negative for fracture or new focal lesion. Sinuses/Orbits: No acute finding. Other: The mastoid air cells are well aerated. IMPRESSION: No acute intracranial process. Electronically Signed   By: Merilyn Baba M.D.   On: 04/30/2022 12:27   _______________________________________________________________________________________________________ Latest  Blood pressure (!) 145/57, pulse (!) 53, temperature 98 F (36.7 C), temperature source Oral, resp. rate 18, height '5\' 2"'$  (1.575 m), weight 65.8 kg, SpO2 91 %.   Vitals  labs and radiology finding personally reviewed  Review of Systems:    Pertinent positives include:  confusion, speech deficit  Constitutional:  No weight loss, night sweats, Fevers, chills, fatigue, weight loss  HEENT:  No headaches, Difficulty swallowing,Tooth/dental problems,Sore throat,  No sneezing, itching, ear ache, nasal congestion, post nasal drip,  Cardio-vascular:  No chest pain, Orthopnea, PND, anasarca, dizziness, palpitations.no Bilateral lower extremity swelling  GI:  No heartburn, indigestion, abdominal pain, nausea, vomiting, diarrhea, change in bowel habits, loss of appetite, melena, blood in stool, hematemesis Resp:  no shortness of breath at  rest. No dyspnea on exertion, No excess mucus, no productive cough, No non-productive cough, No coughing up of blood.No change in color of mucus.No wheezing. Skin:  no rash or lesions. No jaundice GU:  no dysuria, change in color of urine, no urgency or frequency. No straining to urinate.  No flank pain.  Musculoskeletal:  No joint pain or no joint swelling. No decreased range of motion. No back pain.  Psych:  No change in mood or affect. No depression or anxiety. No memory loss.  Neuro: no localizing neurological complaints, no tingling, no weakness, no double vision, no gait abnormality, no slurred speech, no   All systems reviewed and apart from [place] all are negative _______________________________________________________________________________________________ Past Medical History:   Past Medical History:  Diagnosis Date   Cancer ([place]) 2014   kidney   Esophageal stricture    Fibromyalgia    GERD (gastroesophageal reflux disease)    Helicobacter pylori gastritis    Hypertension    Intention tremor    Lumbago    Memory loss    Osteoarthritis of spine    Pneumonia    hx of years ago    PUD (peptic ulcer disease)    PVD (peripheral vascular disease) ([place])      Past Surgical History:  Procedure Laterality Date   [place]   CATARACT EXTRACTION Bilateral 2014   [facility]   LAPAROSCOPIC PARTIAL NEPHRECTOMY Left April '13   [facility], Dr. Alinda Money   LUMBAR FUSION  03/2003   L4-5   [place]    Social History:  Ambulatory   independently       reports that she has never smoked. She has never used smokeless tobacco. She reports current alcohol use of about 7.0 standard drinks of alcohol per week. She reports that she  does not use drugs.     Family History:   Family History  Problem Relation Age of Onset   Heart disease Mother    COPD Mother    Thyroid disease Mother    Diabetes  Father    Heart disease Father        CABG   Stroke Father    Parkinson's disease Father    Heart disease Brother    Stroke Brother    Hyperlipidemia Brother    Diabetes Daughter    Hearing loss Brother    Crohn's disease Other        neice   Hypertension Son    Diabetes Son    Melanoma Son    Colon cancer Neg Hx    Stomach cancer Neg Hx    Esophageal cancer Neg Hx    ______________________________________________________________________________________________ Allergies: Allergies  Allergen Reactions   Morphine And Related Itching and Rash    Given in IV.     Prior to Admission medications   Medication Sig Start Date End Date Taking? Authorizing Provider  ALPRAZolam Duanne Moron) 0.5 MG tablet Take 1 tablet (0.5 mg total) by mouth 2 (two) times daily as needed. for anxiety Patient taking differently: Take 1 mg by mouth at bedtime. 11/14/16   Plotnikov, Evie Lacks, MD  amitriptyline (ELAVIL) 25 MG tablet TAKE 1 TABLET BY MOUTH EVERY DAY AT BEDTIME Patient taking differently: Take 25 mg by mouth at bedtime. 10/23/17   Hoyt Koch, MD  amLODipine (NORVASC) 5 MG tablet Take 1 tablet (5 mg total) by mouth daily. 11/07/16   Lauree Chandler, NP  Artificial Tear Ointment (DRY EYES OP) Apply 1 drop to eye 3 (three) times daily as needed (for dry eyes).    [provider]  buPROPion (WELLBUTRIN) 75 MG tablet Take 75 mg by mouth every morning. 02/28/20   [provider]  calcium-vitamin D (OSCAL WITH D) 500-200 MG-UNIT per tablet Take 1 tablet by mouth daily.    [provider]  Collagen Hydrolysate, Bovine, POWD Take 1 Scoop by mouth daily.    [provider]  DULoxetine (CYMBALTA) 30 MG capsule Take 30 mg by mouth See admin instructions. Take 1 capsule (30 mg) combine with 1 capsule (60 mg) by mouth daily    [provider]  DULoxetine (CYMBALTA) 60 MG capsule Take 60 mg by mouth See admin instructions. Take 1 capsule (60 mg) combine with  1 capsule (30 mg) by mouth daily 04/14/18   [provider]  furosemide (LASIX) 20 MG tablet Take 1 tablet (20 mg total) by mouth daily. NEED ANNUAL VISIT FOR FURTHER REFILLS Patient taking differently: Take 20 mg by mouth daily. 04/29/17   Hoyt Koch, MD  levothyroxine (SYNTHROID) 88 MCG tablet Take 88 mcg by mouth every morning. 04/01/22   [provider]  losartan (COZAAR) 100 MG tablet Take 1 tablet (100 mg total) by mouth daily. NEED ANNUAL VISIT FOR FURTHER REFILLS 04/29/17 05/11/20  Hoyt Koch, MD  meloxicam (MOBIC) 7.5 MG tablet Take 7.5 mg by mouth daily.  03/25/18   [provider]  methylcellulose (CITRUCEL) oral powder 2 tablespoons daily 03/08/21   Irene Shipper, MD  metoprolol succinate (TOPROL-XL) 50 MG 24 hr tablet Take 1 tablet (50 mg total) by mouth daily. Take with or immediately following a meal. NEED ANNUAL VISIT FOR FURTHER REFILLS 04/29/17   Hoyt Koch, MD  Multiple Vitamins-Minerals (MULTIVITAMIN WITH MINERALS) tablet Take 1 tablet by mouth daily.  [provider]  omeprazole (PRILOSEC) 40 MG capsule Take 1 capsule (40 mg total) by mouth daily. NEED ANNUAL VISIT FOR FURTHER REFILLS Patient taking differently: Take 40 mg by mouth daily. 04/29/17   Hoyt Koch, MD  traMADol (ULTRAM) 50 MG tablet TAKE 1 TABLET BY MOUTH EVERY 8 HOURS AS NEEDED FOR MODERATE TO SEVERE PAIN. MAY TAKE 1 ADDITIONAL TABLET DAILY AS NEEDED FOR SEVERE PAIN Patient taking differently: Take 50 mg by mouth 2 (two) times daily. 10/24/16   Hoyt Koch, MD    ___________________________________________________________________________________________________ Physical Exam:    04/30/2022    8:49 PM 04/30/2022    7:00 PM 04/30/2022    6:30 PM  Vitals with BMI  Systolic 027 741 287  Diastolic 57 57 63  Pulse 53 84 60     1. General:  in No  Acute distress   well   -appearing 2. Psychological: Alert and   Oriented 3.  Head/ENT:    Dry Mucous Membranes                          Head Non traumatic, neck supple                           Poor Dentition 4. SKIN: normal  Skin turgor,  Skin clean Dry and intact no rash 5. Heart: Regular rate and rhythm no  Murmur, no Rub or gallop 6. Lungs:  Clear to auscultation bilaterally, no wheezes or crackles   7. Abdomen: Soft,  non-tender, Non distended   obese  bowel sounds present 8. Lower extremities: no clubbing, cyanosis, no  edema 9. Neurologically  strength 5 out of 5 in all 4 extremities cranial nerves II through XII intact 10. MSK: Normal range of motion    Chart has been reviewed  ______________________________________________________________________________________________  Assessment/Plan 81 y.o. female with medical history significant of HTN mild dementia, hypothyroidism, and anxiety  Admitted for   Expressive aphasia  Dysgraphia     Present on Admission:  Aphasic agraphia  Memory loss or impairment  HTN (hypertension), benign  Anxiety  Hypothyroidism  GERD (gastroesophageal reflux disease)  TIA (transient ischemic attack)     Aphasic agraphia Appreciate neurology consult MRI brain ordered. Pending results may need further work-up  Memory loss or impairment Chronic stable monitor for any sign of decompensation  HTN (hypertension), benign Allow permissive hypertension for tonight  Anxiety Continue home medications Xanax '1mg'$  at bedtime and elavil 25 mg po at bedtime  Hypothyroidism TSH appears to be within normal limits continue Synthroid 88 mcg a day  GERD (gastroesophageal reflux disease) Continue PPI protonix '40mg'$  po qday  TIA (transient ischemic attack)  - will admit based on TIA/CVA protocol       Monitor on Tele        /MRI await  Results        CTA as per neurology          Echo to evaluate for possible embolic source,           ECG,   Lipid panel, TSH.        Order PT/OT evaluation.      If passes swallow eval order  heart healthy diet       Will make sure patient is on antiplatelet ASA 81   Start stin if indicated       Allow permissive Hypertension keep BP <220/120  Neurology consulted Have seen pt      Other plan as per orders.  DVT prophylaxis:  SCD      Code Status:  DNR/DNI as per patient   I had personally discussed CODE STATUS with patient     Family Communication:   Family not at  Bedside    Disposition Plan:     To home once workup is complete and patient is stable   Following barriers for discharge:                         TIA/CVA work up completed                           Will need consultants to evaluate patient prior to discharge                       Would benefit from PT/OT eval prior to [facility] called: neurology is aware  Admission status:  ED Disposition     ED Disposition  Admit   Condition  --   [place]: [facility] [100100]  Level of Care: Telemetry Medical [104]  Interfacility transfer: Yes  May place patient in observation at [facility] or [facility] if equivalent level of care is available:: No  Covid Evaluation: Asymptomatic - no recent exposure (last 10 days) testing not required  Diagnosis: Aphasic agraphia [629528]  Admitting Physician: Mercy Riding [[id number]]  Attending Physician: Mercy Riding [[id number]]           Obs      Level of care     tele  indefinitely please discontinue once patient no longer qualifies COVID-19 Labs    Damiano Stamper 04/30/2022, 9:57 PM    [facility]     after 2 AM please page floor coverage PA If 7AM-7PM, please contact the day team taking care of the patient using [email or web address]   Patient was evaluated in the context of the global COVID-19 pandemic, which necessitated consideration that the patient might be at risk for infection with the SARS-CoV-2 virus that causes COVID-19. Institutional protocols and algorithms that pertain to the  evaluation of patients at risk for COVID-19 are in a state of rapid change based on information released by regulatory bodies including the CDC and federal and state organizations. These policies and algorithms were followed during the patient's care.

## 2022-04-30 NOTE — Subjective & Objective (Signed)
Daughter noticed that yesterday at around 6 AM her mother was having trouble speaking very of word finding difficulty also patient noticed that when she was riding the mail she was writing gibberish.  When she got a phone call from a friend they could not understand the email that she wrote This morning when she woke up she tried to ride a checked and noticed that she had trouble her writing was off more scribble like although her speech has improved

## 2022-04-30 NOTE — Progress Notes (Signed)
Per RN, patient headed down for X-Ray + MRI. Will message when patient returns.

## 2022-04-30 NOTE — Progress Notes (Signed)
EEG complete - results pending 

## 2022-04-30 NOTE — ED Notes (Signed)
Daughter Contact: [name] L. Carron Brazen [phone]

## 2022-04-30 NOTE — Plan of Care (Signed)
Patient discussed briefly with Dr. Campbell Stall.  Per history provided to me, patient had transient aphasia yesterday associated with difficulty writing.  Aphasia has resolved with writing difficulty persists.  She has been followed by neurology for mild cognitive impairment with Mini-Mental status fluctuating from 23-26 recently, thought to be secondary to age-related cognitive impairment with potential component of anxiety.  Differential includes stroke/TIA versus focal seizures recently versus cognitive fluctuation secondary to developing dementia or anxiety  Recommend admission to medicine with neurology following the consultation CTA head and neck MRI brain with and without contrast A1c, lipid panel Echocardiogram Routine EEG TSH, B12, thiamine, RPR, HIV, ammonia for reversible causes work-up  MMSE - Mini Mental State Exam 10/25/2021 07/24/2020  Orientation to time 5 5  Orientation to Place 5 5  Registration 3 3  Attention/ Calculation 0 1  Recall 1 3  Language- name 2 objects 2 2  Language- repeat 1 1  Language- follow 3 step command 3 3  Language- read & follow direction 1 1  Write a sentence 1 1  Copy design 1 1  Total score 23 26    Antwan Pandya MD-PhD [facility] [phone]  Available 8 AM to 8 PM, outside these hours please contact Neurologist on call listed on AMION

## 2022-04-30 NOTE — Assessment & Plan Note (Addendum)
Allow permissive hypertension for tonight 

## 2022-04-30 NOTE — Assessment & Plan Note (Signed)
Continue PPI protonix '40mg'$  po qday

## 2022-04-30 NOTE — Assessment & Plan Note (Signed)
Appreciate neurology consult MRI brain ordered. Pending results may need further work-up

## 2022-04-30 NOTE — ED Notes (Signed)
[facility] on unit to transport pt  

## 2022-04-30 NOTE — Assessment & Plan Note (Addendum)
Continue home medications Xanax '1mg'$  at bedtime and elavil 25 mg po at bedtime

## 2022-04-30 NOTE — Assessment & Plan Note (Signed)
Chronic stable monitor for any sign of decompensation

## 2022-04-30 NOTE — Assessment & Plan Note (Signed)
-   will admit based on TIA/CVA protocol       Monitor on Tele        /MRI await  Results        CTA as per neurology          Echo to evaluate for possible embolic source,           ECG,   Lipid panel, TSH.        Order PT/OT evaluation.      If passes swallow eval order heart healthy diet       Will make sure patient is on antiplatelet ASA 81   Start stin if indicated       Allow permissive Hypertension keep BP <220/120        Neurology consulted Have seen pt

## 2022-04-30 NOTE — ED Notes (Signed)
BEFAST / VAN NEGATIVE UPON ADMISSION TO ED

## 2022-04-30 NOTE — ED Triage Notes (Addendum)
Pt is having some alteration in mental status, writing, and gait for 2 days.    Pt has neurologist for same but symptoms have really worsened.  No weakness, no dysphagia,  no facial droop, no difficulties understanding or expressing herself.  Pt also concerned about swelling in her feet.  Pt seen for same here last visit.

## 2022-04-30 NOTE — Consult Note (Addendum)
Neurology Consultation  Reason for Consult: Writing difficulty, transient aphasia Referring Physician: Dr. Roel Cluck  CC: Writing difficulty, transient aphasia History is obtained from: Patient, chart  HPI: [name] is a 81 y.o. female past medical history of MCI, fibromyalgia, hypertension, osteoarthritis, peptic ulcer disease, peripheral vascular disease presenting to the emergency room-freestanding ER-for complaints of transient episodes of writing difficulty and possible word finding difficulty as well. She personally says that she does not remember any speech difficulties either talking or understanding but had difficulty trying to write a text message as well as difficulty with writing checks.  The symptoms lasted a few hours and currently have resolved. No weakness. No headache, no visual symptoms. No prior strokes.  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  She is been reporting of ongoing cognitive decline including requiring more time to do her ADLs but has been dependent and has been taking care of her finances etc.  Her daughter is the one who had concerns for her cognitive deficits and had a follow-up with [facility] neurology.  Last MMSE was March 2023-she scored 23/30.  LKW: sometime yesterday IV thrombolysis given?: no, no focal deficits-symptoms resolved Premorbid modified Rankin scale (mRS): 0   ROS: Full ROS was performed and is negative except as noted in the HPI.  Past Medical History:  Diagnosis Date   Cancer ([place]) 2014   kidney   Esophageal stricture    Fibromyalgia    GERD (gastroesophageal reflux disease)    Helicobacter pylori gastritis    Hypertension    Intention tremor    Lumbago    Memory loss    Osteoarthritis of spine    Pneumonia    hx of years ago    PUD (peptic ulcer disease)    PVD (peripheral vascular disease) ([place])       Family History  Problem Relation Age of Onset   Heart disease  Mother    COPD Mother    Thyroid disease Mother    Diabetes Father    Heart disease Father        CABG   Stroke Father    Parkinson's disease Father    Heart disease Brother    Stroke Brother    Hyperlipidemia Brother    Diabetes Daughter    Hearing loss Brother    Crohn's disease Other        neice   Hypertension Son    Diabetes Son    Melanoma Son    Colon cancer Neg Hx    Stomach cancer Neg Hx    Esophageal cancer Neg Hx       Social History:   reports that she has never smoked. She has never used smokeless tobacco. She reports current alcohol use of about 7.0 standard drinks of alcohol per week. She reports that she does not use drugs.   Medications  Current Facility-Administered Medications:    [START ON 05/01/2022]  stroke: early stages of recovery book, , Does not apply, Once, Doutova, Anastassia, MD   0.9 %  sodium chloride infusion, , Intravenous, Continuous, Doutova, Anastassia, MD   acetaminophen (TYLENOL) tablet 650 mg, 650 mg, Oral, Q4H PRN **OR** acetaminophen (TYLENOL) 160 MG/5ML solution 650 mg, 650 mg, Per Tube, Q4H PRN **OR** acetaminophen (TYLENOL) suppository 650 mg, 650 mg, Rectal, Q4H PRN, Doutova, Anastassia, MD   ALPRAZolam (XANAX) tablet 1 mg, 1 mg, Oral, QHS, Doutova, Anastassia, MD   amitriptyline (ELAVIL) tablet 25 mg, 25  mg, Oral, QHS, Toy Baker, MD   [START ON 05/01/2022] aspirin EC tablet 81 mg, 81 mg, Oral, Daily, Doutova, Anastassia, MD   DULoxetine (CYMBALTA) DR capsule 30 mg, 30 mg, Oral, See admin instructions, Doutova, Anastassia, MD   DULoxetine (CYMBALTA) DR capsule 60 mg, 60 mg, Oral, See admin instructions, Toy Baker, MD   [START ON 05/01/2022] levothyroxine (SYNTHROID) tablet 88 mcg, 88 mcg, Oral, q morning, Doutova, Anastassia, MD   [START ON 05/01/2022] pantoprazole (PROTONIX) EC tablet 40 mg, 40 mg, Oral, Daily, Doutova, Anastassia, MD   senna-docusate (Senokot-S) tablet 1 tablet, 1 tablet, Oral, QHS PRN, Roel Cluck,  Anastassia, MD   traMADol (ULTRAM) tablet 50 mg, 50 mg, Oral, BID, Doutova, Anastassia, MD    Exam: Current vital signs: BP (!) 145/57 (BP Location: Right Arm)   Pulse (!) 53   Temp 98 F (36.7 C) (Oral)   Resp 18   Ht '5\' 2"'$  (1.575 m)   Wt 65.8 kg   SpO2 91%   BMI 26.52 kg/m  Vital signs in last 24 hours: Temp:  [97.8 F (36.6 C)-98.2 F (36.8 C)] 98 F (36.7 C) (09/19 2049) Pulse Rate:  [53-84] 53 (09/19 2049) Resp:  [16-19] 18 (09/19 2049) BP: (114-147)/(40-78) 145/57 (09/19 2049) SpO2:  [91 %-98 %] 91 % (09/19 2049) Weight:  [65.8 kg] 65.8 kg (09/19 1104) General: Awake alert in no distress HNT: Normocephalic atraumatic, neck supple CVS: Regular rhythm Respiratory: Breathing well saturating normally on room air Abdomen nondistended nontender Neurological exam Awake alert oriented x3.  No dysarthria.  No aphasia.  She was able to write full sentences on a sheet of paper provided to her.  She said this is different than the symptoms that brought her in, when she was not able to write checks. She was able to answer orientation questions as well as current president, the president before the current president normally.  She was not able to name President Obama before President Trump. When asked to name the president was from [place] he was shot in [place], she was able to say JFK without hesitation. Cranial nerves II to XII intact Motor examination with no drift in any of the 4 extremities Sensation intact to light touch without extinction on DSS Coordination intact NIH stroke scale-0 Labs I have reviewed labs in epic and the results pertinent to this consultation are:   CBC    Component Value Date/Time   WBC 6.1 04/30/2022 1247   RBC 4.32 04/30/2022 1247   HGB 13.2 04/30/2022 1247   HCT 40.2 04/30/2022 1247   PLT 266 04/30/2022 1247   MCV 93.1 04/30/2022 1247   MCH 30.6 04/30/2022 1247   MCHC 32.8 04/30/2022 1247   RDW 12.8 04/30/2022 1247    LYMPHSABS 1.8 04/30/2022 1247   MONOABS 0.5 04/30/2022 1247   EOSABS 0.2 04/30/2022 1247   BASOSABS 0.1 04/30/2022 1247    CMP     Component Value Date/Time   NA 136 04/30/2022 1247   K 3.9 04/30/2022 1247   CL 102 04/30/2022 1247   CO2 25 04/30/2022 1247   GLUCOSE 102 (H) 04/30/2022 1247   BUN 21 04/30/2022 1247   CREATININE 0.70 04/30/2022 1247   CALCIUM 9.2 04/30/2022 1247   PROT 6.3 (L) 05/11/2020 1203   ALBUMIN 3.4 (L) 05/11/2020 1203   AST 22 05/11/2020 1203   ALT 15 05/11/2020 1203   ALKPHOS 50 05/11/2020 1203   BILITOT 0.8 05/11/2020 1203   GFRNONAA >60 04/30/2022 1247  GFRAA >60 05/16/2020 0603    Lipid Panel     Component Value Date/Time   CHOL 191 04/27/2015 1543   TRIG 109.0 04/27/2015 1543   HDL 64.30 04/27/2015 1543   CHOLHDL 3 04/27/2015 1543   VLDL 21.8 04/27/2015 1543   LDLCALC 105 (H) 04/27/2015 1543     Imaging I have reviewed the images obtained:  CT head with no acute changes  Assessment: 81 year old woman presented to the emergency room with symptoms consistent with pure agraphia of transient nature.  Symptoms have completely resolved.  Would benefit from stroke/TIA risk factor work-up for concern for pure agraphia versus writing apraxia.  Underlying etiology might be vascular versus cognitive.  In addition, would also benefit from some work-up for reversible causes of memory loss as provided by her ongoing history of memory loss.  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 Aspirin 81 High intensity statin CT angio head and neck MR brain without contrast A1c Lipid panel 2D echo PT OT next speech therapy B12, TSH, RPR, thiamine level,  HIV. Routine EEG Stroke team to follow Plan was discussed with the patient and Dr. Roel Cluck on the floor.  -- Amie Portland, MD Neurologist [facility] Pager: [phone]

## 2022-04-30 NOTE — ED Provider Notes (Signed)
[facility] EMERGENCY DEPARTMENT Provider Note   CSN: [id number] Arrival date & time: 04/30/22  1041     History  Chief Complaint  Patient presents with   Altered Mental Status    [name] is a 81 y.o. female.  Patient is an 81 year old female previously diagnosed with hypertension, memory loss, and cognitive impairment excellently 5 years ago presenting for speech difficulty and difficulty writing.  Patient is a very intelligent 81 year old female who lives alone, makes her own home payments, and generally cares for herself.  Her daughter who is in the bedroom states that yesterday morning at approximately 6 AM she spoke with her mother (patient) over the phone who began having difficulty speaking stating  "she would just go to say a sentence and misplaced the words such as improperly labeling an object".  Patient then states that she was attempting to write an email when she realized she was writing "gibberish".  She states she received a call from a friend who states they could not understand her email because it was all letters and no comprehensible words.  Patient then states this morning she awoke and was attempting to write a check and noticed her handwriting was off and she was beginning to scribble when she was not meaning to.  Patient states writing symptoms are still present.  Speech symptoms have resolved.  Denies any recent falls or head trauma.     Denies fevers, chills, coughing, vomiting, diarrhea, or urinary complaints.  Otherwise hx of hypothyroidism with recent low levels requiring increase of synthroid.   The history is provided by the patient. No language interpreter was used.  Altered Mental Status Associated symptoms: headaches   Associated symptoms: no abdominal pain, no fever, no light-headedness, no palpitations, no rash, no seizures, no vomiting and no weakness        Home Medications Prior to Admission medications   Medication Sig Start  Date End Date Taking? Authorizing Provider  ALPRAZolam Duanne Moron) 0.5 MG tablet Take 1 tablet (0.5 mg total) by mouth 2 (two) times daily as needed. for anxiety Patient taking differently: Take 1 mg by mouth at bedtime. 11/14/16   Plotnikov, Evie Lacks, MD  amitriptyline (ELAVIL) 25 MG tablet TAKE 1 TABLET BY MOUTH EVERY DAY AT BEDTIME Patient taking differently: Take 25 mg by mouth at bedtime. 10/23/17   Hoyt Koch, MD  amLODipine (NORVASC) 5 MG tablet Take 1 tablet (5 mg total) by mouth daily. 11/07/16   Lauree Chandler, NP  Artificial Tear Ointment (DRY EYES OP) Apply 1 drop to eye 3 (three) times daily as needed (for dry eyes).    [provider]  buPROPion (WELLBUTRIN) 75 MG tablet Take 75 mg by mouth every morning. 02/28/20   [provider]  calcium-vitamin D (OSCAL WITH D) 500-200 MG-UNIT per tablet Take 1 tablet by mouth daily.    [provider]  Collagen Hydrolysate, Bovine, POWD Take 1 Scoop by mouth daily.    [provider]  DULoxetine (CYMBALTA) 30 MG capsule Take 30 mg by mouth See admin instructions. Take 1 capsule (30 mg) combine with 1 capsule (60 mg) by mouth daily    [provider]  DULoxetine (CYMBALTA) 60 MG capsule Take 60 mg by mouth See admin instructions. Take 1 capsule (60 mg) combine with 1 capsule (30 mg) by mouth daily 04/14/18   [provider]  furosemide (LASIX) 20 MG tablet Take 1 tablet (20 mg total) by mouth daily. NEED ANNUAL  VISIT FOR FURTHER REFILLS Patient taking differently: Take 20 mg by mouth daily. 04/29/17   Hoyt Koch, MD  levothyroxine (SYNTHROID) 88 MCG tablet Take 88 mcg by mouth every morning. 04/01/22   [provider]  losartan (COZAAR) 100 MG tablet Take 1 tablet (100 mg total) by mouth daily. NEED ANNUAL VISIT FOR FURTHER REFILLS 04/29/17 05/11/20  Hoyt Koch, MD  meloxicam (MOBIC) 7.5 MG tablet Take 7.5 mg by mouth daily.  03/25/18   [provider]   methylcellulose (CITRUCEL) oral powder 2 tablespoons daily 03/08/21   Irene Shipper, MD  metoprolol succinate (TOPROL-XL) 50 MG 24 hr tablet Take 1 tablet (50 mg total) by mouth daily. Take with or immediately following a meal. NEED ANNUAL VISIT FOR FURTHER REFILLS 04/29/17   Hoyt Koch, MD  Multiple Vitamins-Minerals (MULTIVITAMIN WITH MINERALS) tablet Take 1 tablet by mouth daily.    [provider]  omeprazole (PRILOSEC) 40 MG capsule Take 1 capsule (40 mg total) by mouth daily. NEED ANNUAL VISIT FOR FURTHER REFILLS Patient taking differently: Take 40 mg by mouth daily. 04/29/17   Hoyt Koch, MD  traMADol (ULTRAM) 50 MG tablet TAKE 1 TABLET BY MOUTH EVERY 8 HOURS AS NEEDED FOR MODERATE TO SEVERE PAIN. MAY TAKE 1 ADDITIONAL TABLET DAILY AS NEEDED FOR SEVERE PAIN Patient taking differently: Take 50 mg by mouth 2 (two) times daily. 10/24/16   Hoyt Koch, MD      Allergies    Morphine and related    Review of Systems   Review of Systems  Constitutional:  Negative for chills and fever.  HENT:  Negative for ear pain and sore throat.   Eyes:  Negative for pain and visual disturbance.  Respiratory:  Negative for cough and shortness of breath.   Cardiovascular:  Negative for chest pain and palpitations.  Gastrointestinal:  Negative for abdominal pain and vomiting.  Genitourinary:  Negative for dysuria and hematuria.  Musculoskeletal:  Negative for arthralgias and back pain.  Skin:  Negative for color change and rash.  Neurological:  Positive for speech difficulty and headaches. Negative for dizziness, seizures, syncope, facial asymmetry, weakness, light-headedness and numbness.  All other systems reviewed and are negative.   Physical Exam Updated Vital Signs BP 128/73 (BP Location: Right Arm)   Pulse 65   Temp 97.8 F (36.6 C) (Oral)   Resp 17   Ht '5\' 2"'$  (1.575 m)   Wt 65.8 kg   SpO2 96%   BMI 26.52 kg/m  Physical Exam Vitals and nursing note  reviewed.  Constitutional:      General: She is not in acute distress.    Appearance: She is well-developed.  HENT:     Head: Normocephalic and atraumatic.  Eyes:     General: Lids are normal. Vision grossly intact.     Conjunctiva/sclera: Conjunctivae normal.     Pupils: Pupils are equal, round, and reactive to light.  Cardiovascular:     Rate and Rhythm: Normal rate and regular rhythm.     Heart sounds: No murmur heard. Pulmonary:     Effort: Pulmonary effort is normal. No respiratory distress.     Breath sounds: Normal breath sounds.  Abdominal:     Palpations: Abdomen is soft.     Tenderness: There is no abdominal tenderness.  Musculoskeletal:        General: No swelling.     Cervical back: Neck supple.  Skin:    General: Skin is warm and dry.  Capillary Refill: Capillary refill takes less than 2 seconds.  Neurological:     General: No focal deficit present.     Mental Status: She is alert and oriented to person, place, and time.     GCS: GCS eye subscore is 4. GCS verbal subscore is 5. GCS motor subscore is 6.     Cranial Nerves: Cranial nerves 2-12 are intact.     Sensory: Sensation is intact.     Motor: Motor function is intact.     Coordination: Coordination is intact.  Psychiatric:        Mood and Affect: Mood normal.     ED Results / Procedures / Treatments   Labs (all labs ordered are listed, but only abnormal results are displayed) Labs Reviewed  BASIC METABOLIC PANEL - Abnormal; Notable for the following components:      Result Value   Glucose, Bld 102 (*)    All other components within normal limits  CBC WITH DIFFERENTIAL/PLATELET  URINALYSIS, ROUTINE W REFLEX MICROSCOPIC  TSH  TROPONIN I (HIGH SENSITIVITY)  TROPONIN I (HIGH SENSITIVITY)    EKG None  Radiology CT Head Wo Contrast  Result Date: 04/30/2022 CLINICAL DATA:  Altered mentation writing, and gait EXAM: CT HEAD WITHOUT CONTRAST TECHNIQUE: Contiguous axial images were obtained from  the base of the skull through the vertex without intravenous contrast. RADIATION DOSE REDUCTION: This exam was performed according to the departmental dose-optimization program which includes automated exposure control, adjustment of the mA and/or kV according to patient size and/or use of iterative reconstruction technique. COMPARISON:  05/11/2020 CT head, correlation is also made with MRI head 08/07/2020 FINDINGS: Brain: No evidence of acute infarction, hemorrhage, mass, mass effect, or midline shift. No hydrocephalus or extra-axial fluid collection. Normal cerebral volume for age. Periventricular white matter changes, likely the sequela of chronic small vessel ischemic disease. Vascular: No hyperdense vessel. Skull: Stable small bilateral skull exostoses. Negative for fracture or new focal lesion. Sinuses/Orbits: No acute finding. Other: The mastoid air cells are well aerated. IMPRESSION: No acute intracranial process. Electronically Signed   By: Merilyn Baba M.D.   On: 04/30/2022 12:27    Procedures .Critical Care  Performed by: Lianne Cure, DO Authorized by: Lianne Cure, DO   Critical care provider statement:    Critical care time (minutes):  30   Critical care was time spent personally by me on the following activities:  Development of treatment plan with patient or surrogate, discussions with consultants, evaluation of patient's response to treatment, examination of patient, ordering and review of laboratory studies, ordering and review of radiographic studies, ordering and performing treatments and interventions, pulse oximetry, re-evaluation of patient's condition and review of old charts   Care discussed with: admitting provider     Care discussed with comment:  Neurology     Medications Ordered in ED Medications - No data to display  ED Course/ Medical Decision Making/ A&P                           Medical Decision Making Amount and/or Complexity of Data Reviewed Labs:  ordered. Radiology: ordered.  Risk Decision regarding hospitalization.   81 year old female previously diagnosed with hypertension, memory loss, and cognitive impairment excellently 5 years ago presenting for speech difficulty and difficulty writing.  Patient is alert and oriented x3, no acute distress, afebrile, stable vital signs.  Physical exam demonstrates no neurological deficits.  NIH stroke scale 0 CT head  demonstrates no acute process Onset up symptoms: yesterday morning at 6 AM  I spoke with on call neurologist Dr. Curly Shores who agrees to be consulted on patient for expressive aphasia (now resolved) and dysgraphia. Agrees to  follow MRI results when patient gets [facility]. I spoke with admitting Dr. Cyndia Skeeters who agrees to accept patient.         Final Clinical Impression(s) / ED Diagnoses Final diagnoses:  Expressive aphasia  Dysgraphia    Rx / DC Orders ED Discharge Orders     None         Lianne Cure, DO 38/88/75 1545

## 2022-05-01 ENCOUNTER — Observation Stay (HOSPITAL_COMMUNITY): Payer: Medicare Other

## 2022-05-01 ENCOUNTER — Observation Stay (HOSPITAL_BASED_OUTPATIENT_CLINIC_OR_DEPARTMENT_OTHER): Payer: Medicare Other

## 2022-05-01 DIAGNOSIS — G459 Transient cerebral ischemic attack, unspecified: Secondary | ICD-10-CM

## 2022-05-01 DIAGNOSIS — R488 Other symbolic dysfunctions: Secondary | ICD-10-CM | POA: Diagnosis not present

## 2022-05-01 DIAGNOSIS — R4182 Altered mental status, unspecified: Secondary | ICD-10-CM | POA: Diagnosis not present

## 2022-05-01 DIAGNOSIS — R413 Other amnesia: Secondary | ICD-10-CM | POA: Diagnosis not present

## 2022-05-01 DIAGNOSIS — F419 Anxiety disorder, unspecified: Secondary | ICD-10-CM | POA: Diagnosis not present

## 2022-05-01 LAB — ECHOCARDIOGRAM COMPLETE
Area-P 1/2: 3.11 cm2
Height: 62 in
S' Lateral: 3.3 cm
Weight: 2320 oz

## 2022-05-01 LAB — CBC
HCT: 35.9 % — ABNORMAL LOW (ref 36.0–46.0)
Hemoglobin: 11.9 g/dL — ABNORMAL LOW (ref 12.0–15.0)
MCH: 31.1 pg (ref 26.0–34.0)
MCHC: 33.1 g/dL (ref 30.0–36.0)
MCV: 93.7 fL (ref 80.0–100.0)
Platelets: 228 10*3/uL (ref 150–400)
RBC: 3.83 MIL/uL — ABNORMAL LOW (ref 3.87–5.11)
RDW: 13.1 % (ref 11.5–15.5)
WBC: 4.7 10*3/uL (ref 4.0–10.5)
nRBC: 0 % (ref 0.0–0.2)

## 2022-05-01 LAB — LIPID PANEL
Cholesterol: 141 mg/dL (ref 0–200)
HDL: 52 mg/dL (ref >40)
LDL Cholesterol: 80 mg/dL (ref 0–99)
Total CHOL/HDL Ratio: 2.7 RATIO
Triglycerides: 46 mg/dL (ref <150)
VLDL: 9 mg/dL (ref 0–40)

## 2022-05-01 LAB — RPR: RPR Ser Ql: NONREACTIVE

## 2022-05-01 MED ORDER — ROSUVASTATIN CALCIUM 10 MG PO TABS: 10.0000 mg | ORAL_TABLET | Freq: Every day | ORAL | 11 refills | Status: DC

## 2022-05-01 MED ORDER — ASPIRIN 81 MG PO TBEC: 81.0000 mg | DELAYED_RELEASE_TABLET | Freq: Every day | ORAL | 12 refills | Status: DC

## 2022-05-01 NOTE — Procedures (Addendum)
Patient Name: [name]  MRN: [id number]  Epilepsy Attending: Lora Havens  Referring Physician/Provider: Amie Portland, MD  Date: 04/30/2022 Duration: 23.07 mins  Patient history: 81 yo F with ams. EEG to evaluate for seizure  Level of alertness: Awake, asleep  AEDs during EEG study: Xanax  Technical aspects: This EEG study was done with scalp electrodes positioned according to the 10-20 International system of electrode placement. Electrical activity was reviewed with band pass filter of 1-'70Hz'$ , sensitivity of 7 uV/mm, display speed of 22m/sec with a '60Hz'$  notched filter applied as appropriate. EEG data were recorded continuously and digitally stored.  Video monitoring was available and reviewed as appropriate.  Description: The posterior dominant rhythm consists of 8 Hz activity of moderate voltage (25-35 uV) seen predominantly in posterior head regions, symmetric and reactive to eye opening and eye closing. Sleep was characterized by vertex waves, sleep spindles (12 to 14 Hz), maximal frontocentral region. Hyperventilation and photic stimulation were not performed.     IMPRESSION: This study is within normal limits.  No seizures or epileptiform discharges were seen during the study.  A normal interictal EEG does not exclude nor support the diagnosis of epilepsy.   Jameela Michna OBarbra Sarks

## 2022-05-01 NOTE — Progress Notes (Signed)
Patient discharged home with daughter. PIV removed, VSS, d/c instructions discussed with patient and daughter and both expressed understanding. Personal belongings sent home with patient.

## 2022-05-01 NOTE — TOC Transition Note (Signed)
Transition of Care [facility]) - CM/SW Discharge Note   Patient Details  Name: [name] MRN: [id number] Date of Birth: Jul 02, 1941  [facility]) CM/SW Contact:  Pollie Friar, RN Phone Number: 05/01/2022, 1:50 PM   Clinical Narrative:    Pt is discharging home with self care. No f/u per OT/ST.  Pt has transportation home.    Final next level of care: Home/Self Care Barriers to Discharge: No Barriers Identified   Patient Goals and CMS Choice        Discharge Placement                       Discharge Plan and Services                                     Social Determinants of Health (SDOH) Interventions     Readmission Risk Interventions     No data to display

## 2022-05-01 NOTE — Progress Notes (Signed)
SLP Cancellation Note  Patient Details Name: [name] MRN: [id number] DOB: 06-Jul-1941   Cancelled treatment:       Reason Eval/Treat Not Completed: SLP screened, no needs identified, will sign off Pt's daughter reported that the pt has been diagnosed with mild cognitive impairment and has been been demonstrating difficulty with bill management and more complex tasks. Pt's daughter indicated that she will follow up with the pt's primary neurologist (Dr. Leonie Man) for possible outpatient SLP services for assistance with compensation. Pt and her daughter reported that the pt's verbal expression and writing are back to baseline and MRI was negative for acute changes. A formal evaluation does not appear to be clinically indicated at this time. SLP will sign off.   Lusia Greis I. Hardin Negus, [place], [place] Office number [phone]   Horton Marshall 05/01/2022, 11:36 AM

## 2022-05-01 NOTE — Progress Notes (Addendum)
STROKE TEAM PROGRESS NOTE   SUBJECTIVE (INTERVAL HISTORY) Her daughter is at the bedside.  Overall her condition is completely resolved. Per daughter, pt has hx of MCI with sometimes word finding difficulty, for which pt is following with Dr. Leonie [name] at [facility]. Daughter did see this is more frequent as time goes by. Also, pt was not familiar with advanced computer technology recently, so she was frequently blocked out from her online bank account due to username or password issue. Yesterday, she was again blocked out from her bank account and she was frustrated and tried to write her check and found to have difficulty to write. She did not talk to anybody at that time so does not know if she was able to talk but she felt her speech was fine. She denies any facial droop, weakness or numbness. Symptoms resolved in several hours.    OBJECTIVE Temp:  [97.8 F (36.6 C)-98.6 F (37 C)] 98.2 F (36.8 C) (09/20 1128) Pulse Rate:  [53-84] 58 (09/20 1128) Cardiac Rhythm: Normal sinus rhythm (09/20 0706) Resp:  [16-18] 16 (09/20 1128) BP: (109-145)/(50-73) 109/56 (09/20 1128) SpO2:  [91 %-98 %] 97 % (09/20 1128)  No results for input(s): "GLUCAP" in the last 168 hours. Recent Labs  Lab 04/30/22 1247  NA 136  K 3.9  CL 102  CO2 25  GLUCOSE 102*  BUN 21  CREATININE 0.70  CALCIUM 9.2   No results for input(s): "AST", "ALT", "ALKPHOS", "BILITOT", "PROT", "ALBUMIN" in the last 168 hours. Recent Labs  Lab 04/30/22 1247 05/01/22 0757  WBC 6.1 4.7  NEUTROABS 3.6  --   HGB 13.2 11.9*  HCT 40.2 35.9*  MCV 93.1 93.7  PLT 266 228   No results for input(s): "CKTOTAL", "CKMB", "CKMBINDEX", "TROPONINI" in the last 168 hours. No results for input(s): "LABPROT", "INR" in the last 72 hours. Recent Labs    04/30/22 1224  COLORURINE YELLOW  LABSPEC 1.015  PHURINE 7.0  GLUCOSEU NEGATIVE  HGBUR NEGATIVE  BILIRUBINUR NEGATIVE  KETONESUR NEGATIVE  PROTEINUR NEGATIVE  NITRITE NEGATIVE  LEUKOCYTESUR  NEGATIVE       Component Value Date/Time   CHOL 141 05/01/2022 0757   TRIG 46 05/01/2022 0757   HDL 52 05/01/2022 0757   CHOLHDL 2.7 05/01/2022 0757   VLDL 9 05/01/2022 0757   LDLCALC 80 05/01/2022 0757   Lab Results  Component Value Date   HGBA1C 5.7 (H) 04/30/2022   No results found for: "LABOPIA", "COCAINSCRNUR", "LABBENZ", "AMPHETMU", "THCU", "LABBARB"  No results for input(s): "ETH" in the last 168 hours.  I have personally reviewed the radiological images below and agree with the radiology interpretations.  ECHOCARDIOGRAM COMPLETE  Result Date: 05/01/2022    ECHOCARDIOGRAM REPORT   Patient Name:   [name] Date of Exam: 05/01/2022 Medical Rec #:  [id number]       Height:       62.0 in Accession #:    [id number]      Weight:       145.0 lb Date of Birth:  Sep 30, 1940       BSA:          1.667 m Patient Age:    81 years        BP:           116/50 mmHg Patient Gender: F               HR:           65 bpm. Exam  Location:  Inpatient Procedure: 2D Echo, 3D Echo, Cardiac Doppler and Color Doppler Indications:    Stroke  History:        Patient has prior history of Echocardiogram examinations, most                 recent 05/17/2020. TIA; Risk Factors:Hypertension and Non-Smoker.  Sonographer:    Darlina Sicilian RDCS Referring Phys: [place]  1. Left ventricular ejection fraction, by estimation, is 60 to 65%. The left ventricle has normal function. The left ventricle has no regional wall motion abnormalities. Left ventricular diastolic parameters are indeterminate.  2. Right ventricular systolic function is normal. The right ventricular size is normal. Tricuspid regurgitation signal is inadequate for assessing PA pressure.  3. The mitral valve is normal in structure. Trivial mitral valve regurgitation. No evidence of mitral stenosis.  4. The aortic valve is tricuspid. There is mild calcification of the aortic valve. Aortic valve regurgitation is not visualized. Aortic  valve sclerosis is present, with no evidence of aortic valve stenosis.  5. The inferior vena cava is normal in size with greater than 50% respiratory variability, suggesting right atrial pressure of 3 mmHg. Comparison(s): No significant change from prior study. Conclusion(s)/Recommendation(s): Normal biventricular function without evidence of hemodynamically significant valvular heart disease. No intracardiac source of embolism detected on this transthoracic study. Consider a transesophageal echocardiogram to exclude cardiac source of embolism if clinically indicated. FINDINGS  Left Ventricle: Left ventricular ejection fraction, by estimation, is 60 to 65%. The left ventricle has normal function. The left ventricle has no regional wall motion abnormalities. The global longitudinal strain is normal despite suboptimal segment tracking. The left ventricular internal cavity size was normal in size. There is no left ventricular hypertrophy. Left ventricular diastolic parameters are indeterminate. Right Ventricle: The right ventricular size is normal. No increase in right ventricular wall thickness. Right ventricular systolic function is normal. Tricuspid regurgitation signal is inadequate for assessing PA pressure. Left Atrium: Left atrial size was normal in size. Right Atrium: Right atrial size was normal in size. Pericardium: There is no evidence of pericardial effusion. Mitral Valve: The mitral valve is normal in structure. Trivial mitral valve regurgitation. No evidence of mitral valve stenosis. Tricuspid Valve: The tricuspid valve is normal in structure. Tricuspid valve regurgitation is trivial. No evidence of tricuspid stenosis. Aortic Valve: The aortic valve is tricuspid. There is mild calcification of the aortic valve. Aortic valve regurgitation is not visualized. Aortic valve sclerosis is present, with no evidence of aortic valve stenosis. Pulmonic Valve: The pulmonic valve was grossly normal. Pulmonic valve  regurgitation is not visualized. No evidence of pulmonic stenosis. Aorta: The aortic root, ascending aorta, aortic arch and descending aorta are all structurally normal, with no evidence of dilitation or obstruction. Venous: The inferior vena cava is normal in size with greater than 50% respiratory variability, suggesting right atrial pressure of 3 mmHg. IAS/Shunts: The atrial septum is grossly normal.  LEFT VENTRICLE PLAX 2D LVIDd:         4.90 cm   Diastology LVIDs:         3.30 cm   LV e' medial:    4.82 cm/s LV PW:         0.80 cm   LV E/e' medial:  15.6 LV IVS:        0.90 cm   LV e' lateral:   7.18 cm/s LVOT diam:     1.70 cm   LV E/e' lateral: 10.4 LV SV:  52 LV SV Index:   31 LVOT Area:     2.27 cm                           3D Volume EF:                          3D EF:        55 %                          LV EDV:       112 ml                          LV ESV:       50 ml                          LV SV:        62 ml RIGHT VENTRICLE RV S prime:     19.50 cm/s TAPSE (M-mode): 2.2 cm LEFT ATRIUM             Index        RIGHT ATRIUM           Index LA diam:        4.10 cm 2.46 cm/m   RA Area:     14.60 cm LA Vol (A2C):   36.4 ml 21.83 ml/m  RA Volume:   34.00 ml  20.39 ml/m LA Vol (A4C):   48.3 ml 28.97 ml/m LA Biplane Vol: 42.1 ml 25.25 ml/m  AORTIC VALVE LVOT Vmax:   106.00 cm/s LVOT Vmean:  76.600 cm/s LVOT VTI:    0.231 m  AORTA Ao Root diam: 3.00 cm Ao Asc diam:  2.80 cm MITRAL VALVE MV Area (PHT): 3.11 cm    SHUNTS MV Decel Time: 244 msec    Systemic VTI:  0.23 m MV E velocity: 75.00 cm/s  Systemic Diam: 1.70 cm MV A velocity: 58.70 cm/s MV E/A ratio:  1.28 Buford Dresser MD Electronically signed by Buford Dresser MD Signature Date/Time: 05/01/2022/12:45:33 PM    Final    EEG adult  Result Date: 05/01/2022 Lora Havens, MD     05/01/2022  9:35 AM Patient Name: [name] MRN: [id number] Epilepsy Attending: Lora Havens Referring Physician/Provider: Amie Portland,  MD Date: 04/30/2022 Duration: 23.07 mins Patient history: 81 yo F with ams. EEG to evaluate for seizure Level of alertness: Awake, asleep AEDs during EEG study: Xanax Technical aspects: This EEG study was done with scalp electrodes positioned according to the 10-20 International system of electrode placement. Electrical activity was reviewed with band pass filter of 1-'70Hz'$ , sensitivity of 7 uV/mm, display speed of 100m/sec with a '60Hz'$  notched filter applied as appropriate. EEG data were recorded continuously and digitally stored.  Video monitoring was available and reviewed as appropriate. Description: The posterior dominant rhythm consists of 8 Hz activity of moderate voltage (25-35 uV) seen predominantly in posterior head regions, symmetric and reactive to eye opening and eye closing. Sleep was characterized by vertex waves, sleep spindles (12 to 14 Hz), maximal frontocentral region. Hyperventilation and photic stimulation were not performed.   IMPRESSION: This study is within normal limits.  No seizures or epileptiform discharges were seen during the study. A normal interictal EEG does not exclude nor support the  diagnosis of epilepsy. Lora Havens   CT ANGIO HEAD NECK W WO CM  Result Date: 05/01/2022 CLINICAL DATA:  Initial evaluation for acute stroke. EXAM: CT ANGIOGRAPHY HEAD AND NECK TECHNIQUE: Multidetector CT imaging of the head and neck was performed using the standard protocol during bolus administration of intravenous contrast. Multiplanar CT image reconstructions and MIPs were obtained to evaluate the vascular anatomy. Carotid stenosis measurements (when applicable) are obtained utilizing NASCET criteria, using the distal internal carotid diameter as the denominator. RADIATION DOSE REDUCTION: This exam was performed according to the departmental dose-optimization program which includes automated exposure control, adjustment of the mA and/or kV according to patient size and/or use of iterative  reconstruction technique. CONTRAST:  69m OMNIPAQUE IOHEXOL 350 MG/ML SOLN COMPARISON:  Comparison made with prior CT from earlier the same day. FINDINGS: CT HEAD FINDINGS Brain: Cerebral volume within normal limits. Mild to moderate chronic microvascular ischemic disease noted. No acute intracranial hemorrhage. No acute large vessel territory infarct. No mass lesion or midline shift. No hydrocephalus or extra-axial fluid collection. Vascular: No abnormal hyperdense vessel. Skull: Scalp soft tissues and calvarium within normal limits. Sinuses/Orbits: Globes orbital soft tissues within normal limits. Paranasal sinuses and mastoid air cells are largely clear. Other: None. Review of the MIP images confirms the above findings CTA NECK FINDINGS Aortic arch: Visualized aortic arch normal in caliber with normal branch pattern. Minimal plaque within the arch itself for patient age. No stenosis about the origin the great vessels. Right carotid system: The right common and internal carotid arteries patent without dissection or occlusion. No significant atheromatous narrowing about the right carotid bulb. 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 Left carotid system: Left common and internal carotid arteries patent without dissection or occlusion. Minimal for age plaque noted about the left carotid bulb without stenosis. Possible mild changes of FMD noted about the cervical left ICA. Vertebral arteries: Both vertebral arteries arise from the subclavian arteries. No proximal subclavian artery stenosis. Right vertebral artery dominant. Vertebral arteries widely patent without dissection or stenosis. 5 mm outpouching extending from the right V3 segment consistent with a small pseudoaneurysm (series 11, image 173). This is likely chronic in nature. Skeleton: No discrete or worrisome osseous lesions. Moderate spondylosis noted at C5-6 and C6-7. Other neck: No other acute soft tissue abnormality within  the neck. Small lipoma noted at the left posterior neck. Upper chest: Visualized upper chest demonstrates no acute finding. Review of the MIP images confirms the above findings CTA HEAD FINDINGS Anterior circulation: Both internal carotid arteries widely patent to the termini without stenosis. A1 segments widely patent. Normal anterior communicating artery complex. Both anterior cerebral arteries widely patent to their distal aspects without stenosis. No M1 stenosis or occlusion. Normal MCA bifurcations. Distal MCA branches well perfused and symmetric. Posterior circulation: Dominant right V4 segment widely patent to the vertebrobasilar junction. Right PICA patent. Left vertebral artery largely terminates in PICA, although a tiny branch is seen ascending towards the vertebrobasilar junction. Left PICA patent as well. Basilar patent to its distal aspect without stenosis. Superior cerebral arteries patent bilaterally. Right PCA supplied via the basilar. Predominant fetal type origin of the left PCA. Both PCAs patent distal aspects without stenosis. Venous sinuses: Patent allowing for timing the contrast bolus. Anatomic variants: As above. Review of the MIP images confirms the above findings IMPRESSION: CT HEAD IMPRESSION: 1. No acute intracranial abnormality. 2. 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 CTA HEAD AND NECK IMPRESSION: 1. Negative CTA for large  vessel occlusion or other emergent finding. 2. Minimal atheromatous disease for age. No hemodynamically significant or correctable stenosis. 3. 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 4. 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 Electronically Signed   By: Jeannine Boga M.D.   On: 05/01/2022 06:35   MR BRAIN WO CONTRAST  Result Date: 05/01/2022 CLINICAL DATA:  Initial evaluation for neuro deficit, stroke suspected. EXAM: MRI HEAD WITHOUT CONTRAST  TECHNIQUE: Multiplanar, multiecho pulse sequences of the brain and surrounding structures were obtained without intravenous contrast. COMPARISON:  Comparison made with prior CTs from earlier the same day./2 FINDINGS: Brain: Cerebral volume within normal limits for age. Scattered patchy T2/FLAIR hyperintensity involving the periventricular and deep white matter both cerebral hemispheres as well as the pons, most consistent with chronic small vessel ischemic disease, mild to moderate in nature. No evidence for acute or subacute ischemia. Gray-white matter differentiation maintained. No areas of chronic cortical infarction. No acute or chronic intracranial blood products. No mass lesion, midline shift or mass effect. No hydrocephalus or extra-axial fluid collection. Pituitary gland and suprasellar region within normal limits. Vascular: Major intracranial vascular flow voids are maintained. Skull and upper cervical spine: Basal junction within normal limits. Bone marrow signal intensity normal. No scalp soft tissue abnormality. Sinuses/Orbits: Patient status post bilateral ocular lens replacement. Mild scattered mucosal thickening noted about the ethmoidal air cells. Paranasal sinuses are otherwise largely clear. No mastoid effusion. Other: None. IMPRESSION: 1. No acute intracranial infarct or other abnormality. 2. 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 Electronically Signed   By: Jeannine Boga M.D.   On: 05/01/2022 06:15   DG Chest 2 View  Result Date: 04/30/2022 CLINICAL DATA:  TIA. EXAM: CHEST - 2 VIEW COMPARISON:  May 18, 2020 FINDINGS: The heart size and mediastinal contours are within normal limits. Both lungs are clear. Bilateral breast implants are noted. The visualized skeletal structures are unremarkable. IMPRESSION: No active cardiopulmonary disease. Electronically Signed   By: Virgina Norfolk M.D.   On: 04/30/2022 22:22   CT Head Wo Contrast  Result Date:  04/30/2022 CLINICAL DATA:  Altered mentation writing, and gait EXAM: CT HEAD WITHOUT CONTRAST TECHNIQUE: Contiguous axial images were obtained from the base of the skull through the vertex without intravenous contrast. RADIATION DOSE REDUCTION: This exam was performed according to the departmental dose-optimization program which includes automated exposure control, adjustment of the mA and/or kV according to patient size and/or use of iterative reconstruction technique. COMPARISON:  05/11/2020 CT head, correlation is also made with MRI head 08/07/2020 FINDINGS: Brain: No evidence of acute infarction, hemorrhage, mass, mass effect, or midline shift. No hydrocephalus or extra-axial fluid collection. Normal cerebral volume for age. Periventricular white matter changes, likely the sequela of chronic small vessel ischemic disease. Vascular: No hyperdense vessel. Skull: Stable small bilateral skull exostoses. Negative for fracture or new focal lesion. Sinuses/Orbits: No acute finding. Other: The mastoid air cells are well aerated. IMPRESSION: No acute intracranial process. Electronically Signed   By: Merilyn Baba M.D.   On: 04/30/2022 12:27   US Venous Img Lower Right (DVT Study)  Result Date: 04/12/2022 CLINICAL DATA:  Edema of the lower extremity. EXAM: RIGHT LOWER EXTREMITY VENOUS DOPPLER ULTRASOUND TECHNIQUE: Gray-scale sonography with compression, as well as color and duplex ultrasound, were performed to evaluate the deep venous system(s) from the level of the common femoral vein through the popliteal and proximal calf veins. COMPARISON:  None Available. FINDINGS: VENOUS  Normal compressibility of the common femoral, superficial femoral, and popliteal veins, as well as the visualized calf veins. Visualized portions of profunda femoral vein and great saphenous vein unremarkable. No filling defects to suggest DVT on grayscale or color Doppler imaging. Doppler waveforms show normal direction of venous flow, normal  respiratory plasticity and response to augmentation. Limited views of the contralateral common femoral vein are unremarkable. OTHER Edema noted throughout the lower leg. Limitations: none IMPRESSION: Negative for sonographic evidence of DVT in the RIGHT lower extremity. Edema in the RIGHT lower leg. Electronically Signed   By: Zetta Bills M.D.   On: 04/12/2022 13:52   VAS [place] LOWER EXTREMITY VENOUS REFLUX  Result Date: 04/03/2022  Lower Venous Reflux Study Patient Name:  [name]  Date of Exam:   04/03/2022 Medical Rec #: [id number]        Accession #:    [id number] Date of Birth: [birth year]        Patient Gender: F Patient Age:   24 years Exam Location:  [facility] Procedure:      VAS [place] LOWER EXTREMITY VENOUS REFLUX Referring Phys: Jamelle Haring --------------------------------------------------------------------------------  Performing Technologist: Elta Guadeloupe RVT, RDMS  Examination Guidelines: A complete evaluation includes B-mode imaging, spectral Doppler, color Doppler, and power Doppler as needed of all accessible portions of each vessel. Bilateral testing is considered an integral part of a complete examination. Limited examinations for reoccurring indications may be performed as noted. The reflux portion of the exam is performed with the patient in reverse Trendelenburg. Significant venous reflux is defined as >500 ms in the superficial venous system, and >1 second in the deep venous system.  Venous Reflux Times +--------------+---------+------+-----------+------------+--------+ RIGHT         Reflux NoRefluxReflux TimeDiameter cmsComments                         Yes                                  +--------------+---------+------+-----------+------------+--------+ CFV                     yes   >1 second                      +--------------+---------+------+-----------+------------+--------+ FV prox       no                                              +--------------+---------+------+-----------+------------+--------+ FV mid                  yes   >1 second                      +--------------+---------+------+-----------+------------+--------+ FV dist                 yes   >1 second                      +--------------+---------+------+-----------+------------+--------+ Popliteal     no                                             +--------------+---------+------+-----------+------------+--------+  GSV at [facility]              yes    >500 ms      0.46             +--------------+---------+------+-----------+------------+--------+ GSV prox thigh          yes    >500 ms      0.38             +--------------+---------+------+-----------+------------+--------+ GSV mid thigh no                            0.25             +--------------+---------+------+-----------+------------+--------+ GSV dist thighno                            0.26             +--------------+---------+------+-----------+------------+--------+ GSV at knee   no                            0.25             +--------------+---------+------+-----------+------------+--------+ GSV prox calf           yes    >500 ms      0.14             +--------------+---------+------+-----------+------------+--------+ SSV Pop Fossa no                            0.12             +--------------+---------+------+-----------+------------+--------+ SSV prox calf no                            0.13             +--------------+---------+------+-----------+------------+--------+   Summary: Right: - No evidence of deep vein thrombosis seen in the right lower extremity, from the common femoral through the popliteal veins. - No evidence of superficial venous thrombosis in the right lower extremity. - Venous reflux is noted in the right common femoral vein. - Venous reflux is noted in the right sapheno-femoral junction. - Venous reflux is noted in the right  greater saphenous vein in the thigh. - Venous reflux is noted in the right greater saphenous vein in the calf. - Venous reflux is noted in the right femoral vein.  - Incidental finding of complex heterogeneous cystic structure in the Right Popliteal fossa. 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  *See table(s) above for measurements and observations. Electronically signed by Servando Snare MD on 04/03/2022 at 4:45:09 PM.    Final      PHYSICAL EXAM  Temp:  [97.8 F (36.6 C)-98.6 F (37 C)] 98.2 F (36.8 C) (09/20 1128) Pulse Rate:  [53-84] 58 (09/20 1128) Resp:  [16-18] 16 (09/20 1128) BP: (109-145)/(50-73) 109/56 (09/20 1128) SpO2:  [91 %-98 %] 97 % (09/20 1128)  General - Well nourished, well developed, in no apparent distress.  Ophthalmologic - fundi not visualized due to noncooperation.  Cardiovascular - Regular rhythm and rate.  Mental Status -  Level of arousal and orientation to time, place, and person were intact. Language including expression, naming, repetition, comprehension was assessed and found intact. Fund of Knowledge was assessed and was intact.  Cranial Nerves II -  XII - II - Visual field intact OU. III, IV, VI - Extraocular movements intact. V - Facial sensation intact bilaterally. VII - Facial movement intact bilaterally. VIII - Hearing & vestibular intact bilaterally. X - Palate elevates symmetrically. XI - Chin turning & shoulder shrug intact bilaterally. XII - Tongue protrusion intact.  Motor Strength - The patient's strength was normal in all extremities and pronator drift was absent.  Bulk was normal and fasciculations were absent.   Motor Tone - Muscle tone was assessed at the neck and appendages and was normal.  Reflexes - The patient's reflexes were symmetrical in all extremities and she had no pathological reflexes.  Sensory - Light touch, temperature/pinprick were assessed and were symmetrical.    Coordination - The patient had  normal movements in the hands and feet with no ataxia or dysmetria.  Tremor was absent.  Gait and Station - deferred.   ASSESSMENT/PLAN [name] is a 81 y.o. female with history of MCI, hypertension, PVD, PUD admitted for transient difficulty of writing. No tPA given due to symptom resolved.    TIA vs. Anxiety vs. 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  Continue on discharge Patient counseled to be compliant with her antithrombotic medications Ongoing aggressive stroke risk factor management Therapy recommendations: None Disposition: Home  MCI hx of MCI with sometimes word finding difficulty, for which pt is following with Dr. Leonie [name] at [facility].  Daughter did see this is more frequent over the time.  ?? Early phase of PPA Follow-up with Dr [name] at [facility] in 4 weeks.   Hypertension Stable Long term BP goal normal tensive  Hyperlipidemia Home meds: None LDL 88, goal < 70 Now on Crestor 10 Continue statin at discharge  Other Stroke Risk Factors Advanced age PVD  Other Active Problems PUD  Hospital day # 0  Neurology will sign off. Please call with questions. Pt will follow up with stroke clinic Dr. Leonie [name] at [facility] in about 4 weeks. Thanks for the consult.   Rosalin Hawking, MD PhD Stroke Neurology 05/01/2022 2:47 PM    To contact Stroke Continuity provider, please refer to [email or web address] After hours, contact General Neurology

## 2022-05-01 NOTE — Evaluation (Signed)
Physical Therapy Evaluation Patient Details Name: [name] MRN: [id number] DOB: 09-08-1940 Today's Date: 05/01/2022  History of Present Illness  [name] is a 81 y.o. female who presented to the ER 04/30/22 for complaints of transient episodes of writing difficulty and possible word finding difficulty as well. MRI negative for acute changes. PMH significant for MCI, fibromyalgia, hypertension, osteoarthritis, peptic ulcer disease, peripheral vascular disease   Clinical Impression  [name] is 81 y.o. female admitted with above HPI and diagnosis. Patient is currently limited by functional impairments below (see PT problem list). Patient lives alone and is independent at baseline. She is close to baseline of independence and currently requires extra time for transfers and supervision for gait and stair negotiation with no AD. VSS throughout and pt ambulated ~400' and was steady while ascending/descending 6 steps with hand rails. Patient will benefit from continued skilled PT interventions to address impairments and progress independence with mobility, anticipate no follow up PT needed, pt is close to baseline and participates in personal training sessions 2x/week. Acute PT will follow and progress as able.        Recommendations for follow up therapy are one component of a multi-disciplinary discharge planning process, led by the attending physician.  Recommendations may be updated based on patient status, additional functional criteria and insurance authorization.  Follow Up Recommendations No PT follow up      Assistance Recommended at Discharge Intermittent Supervision/Assistance  Patient can return home with the following  A little help with walking and/or transfers;Assistance with cooking/housework;Help with stairs or ramp for entrance;Assist for transportation;Direct supervision/assist for medications management;A little help with bathing/dressing/bathroom    Equipment  Recommendations None recommended by PT  Recommendations for Other Services       Functional Status Assessment Patient has had a recent decline in their functional status and demonstrates the ability to make significant improvements in function in a reasonable and predictable amount of time.     Precautions / Restrictions Precautions Precautions: Fall Restrictions Weight Bearing Restrictions: No      Mobility  Bed Mobility               General bed mobility comments: OOB in recliner    Transfers Overall transfer level: Needs assistance Equipment used: None Transfers: Sit to/from Stand Sit to Stand: Modified independent (Device/Increase time)           General transfer comment: extra time, pt using bil UE for power up and to control lower, no cues or assist.    Ambulation/Gait Ambulation/Gait assistance: Min guard, Supervision Gait Distance (Feet): 400 Feet Assistive device: None Gait Pattern/deviations: Decreased stride length, Step-through pattern Gait velocity: fair     General Gait Details: overall steady pace and no overt LOB, pt with symetic arm swing, no drift/sway and able to complete high level challenges during gait (sudden stops, 360* turns, walk/talk, pivot).  Stairs Stairs: Yes Stairs assistance: Supervision Stair Management: One rail Right, Two rails, Step to pattern, Forwards Number of Stairs: 6 General stair comments: steady with safe step sequencing and no LOB, pt alternating between bil UE support and single UE support, no difference noted in stability.  Wheelchair Mobility    Modified Rankin (Stroke Patients Only)       Balance Overall balance assessment: Needs assistance Sitting-balance support: Feet supported Sitting balance-Leahy Scale: Good     Standing balance support: Single extremity supported, During functional activity Standing balance-Leahy Scale: Fair  Pertinent Vitals/Pain  Pain Assessment Pain Assessment: No/denies pain    Home Living Family/patient expects to be discharged to:: Private residence (condo/townhome) Living Arrangements: Alone Available Help at Discharge: Family Type of Home: House Home Access: Level entry     Alternate Level Stairs-Number of Steps: flight Home Layout: Two level;Able to live on main level with bedroom/bathroom Home Equipment: None      Prior Function Prior Level of Function : Independent/Modified Independent;Driving             Mobility Comments: no AD ADLs Comments: indep for ADL/IADL     Hand Dominance   Dominant Hand: Right    Extremity/Trunk Assessment   Upper Extremity Assessment Upper Extremity Assessment: Defer to OT evaluation;Overall WFL for tasks assessed    Lower Extremity Assessment Lower Extremity Assessment: Overall WFL for tasks assessed    Cervical / Trunk Assessment Cervical / Trunk Assessment: Normal  Communication   Communication: No difficulties  Cognition Arousal/Alertness: Awake/alert Behavior During Therapy: WFL for tasks assessed/performed Overall Cognitive Status: History of cognitive impairments - at baseline                                 General Comments: per daughter pt has a hx of "Mild cognitive impairment."        General Comments General comments (skin integrity, edema, etc.): VSS throughout, HR in 70-90's with mobility, SpO2 94% or better on RA    Exercises     Assessment/Plan    PT Assessment Patient needs continued PT services  PT Problem List Decreased activity tolerance;Decreased mobility;Decreased safety awareness;Decreased knowledge of precautions       PT Treatment Interventions DME instruction;Gait training;Stair training;Functional mobility training;Therapeutic activities;Therapeutic exercise;Balance training;Neuromuscular re-education;Patient/family education    PT Goals (Current goals can be found in the Care Plan section)   Acute Rehab PT Goals Patient Stated Goal: get home and back to working with personal trainer PT Goal Formulation: With patient Time For Goal Achievement: 05/15/22 Potential to Achieve Goals: Good    Frequency Min 3X/week     Co-evaluation               AM-PAC PT "6 Clicks" Mobility  Outcome Measure Help needed turning from your back to your side while in a flat bed without using bedrails?: None Help needed moving from lying on your back to sitting on the side of a flat bed without using bedrails?: None Help needed moving to and from a bed to a chair (including a wheelchair)?: A Little Help needed standing up from a chair using your arms (e.g., wheelchair or bedside chair)?: None Help needed to walk in hospital room?: A Little Help needed climbing 3-5 steps with a railing? : A Little 6 Click Score: 21    End of Session Equipment Utilized During Treatment: Gait belt Activity Tolerance: Patient tolerated treatment well Patient left: in chair;with call bell/phone within reach;with family/visitor present Nurse Communication: Mobility status PT Visit Diagnosis: Difficulty in walking, not elsewhere classified (R26.2);Other abnormalities of gait and mobility (R26.89)    Time: 2683-4196 PT Time Calculation (min) (ACUTE ONLY): 17 min   Charges:   PT Evaluation $PT Eval Low Complexity: 1 Low          Verner Mould, DPT Acute Rehabilitation Services Office [phone] Pager [phone]  05/01/22 2:46 PM

## 2022-05-01 NOTE — Progress Notes (Signed)
OT impression: Pt was evaluated s/p the above admission list, she is indep at baseline. Per daughter pt has a history of mild cognitive impairment. Pt's cognition was [facility] for functional tasks assess this date, however she did repeat herself a few times throughout the session and was noted to have mild word finding difficulties. Overall she needed up to close min G for ADLs and mobility, no over LOB and no AD needed. OT to continue to follow acutely. Recommend d/c to home with assist from family as needed.     05/01/22 0900  OT Visit Information  Last OT Received On 05/01/22  Assistance Needed +1  History of Present Illness [name] is a 81 y.o. female who presented to the ER 04/30/22 for complaints of transient episodes of writing difficulty and possible word finding difficulty as well. MRI negative for acute changes. PMH significant for MCI, fibromyalgia, hypertension, osteoarthritis, peptic ulcer disease, peripheral vascular disease  Precautions  Precautions Fall  Restrictions  Weight Bearing Restrictions No  Home Living  Family/patient expects to be discharged to: Private residence (condo/townhome)  Living Arrangements Alone  Available Help at Discharge Family  Type of [place] Access Level entry  [place] Two level;Able to live on main level with bedroom/bathroom  Alternate Level Stairs-Number of Steps flight  Bathroom Shower/Tub Walk-in shower  Bathroom Toilet Handicapped height  Home Equipment None  Prior Function  Prior Level of Function  Independent/Modified Independent;Driving  Mobility Comments no AD  ADLs Comments indep for ADL/IADL  Communication  Communication No difficulties  Pain Assessment  Pain Assessment No/denies pain  Cognition  Arousal/Alertness Awake/alert  Behavior During Therapy WFL for tasks assessed/performed  Overall Cognitive Status History of cognitive impairments - at baseline  General Comments per daughter pt has a hx of "Mild  cognitive impairment."  Upper Extremity Assessment  Upper Extremity Assessment Overall WFL for tasks assessed  Lower Extremity Assessment  Lower Extremity Assessment Defer to PT evaluation  Cervical / Trunk Assessment  Cervical / Trunk Assessment Normal  Vision- History  Baseline Vision/History 1 Wears glasses  Ability to See in Adequate Light 0 Adequate  Vision- Assessment  Vision Assessment? No apparent visual deficits  Perception  Perception Tested? No  Praxis  Praxis tested? Not tested  ADL  Overall ADL's  Needs assistance/impaired  Eating/Feeding Independent;Sitting  Grooming Minimal assistance;Standing  Upper Body Bathing Set up;Sitting  Lower Body Bathing Min guard;Sit to/from stand  Upper Body Dressing  Set up;Sitting  Lower Body Dressing Min guard;Sit to/from Environmental education officer guard;Ambulation;Regular Toilet  Toileting- Water quality scientist and Hygiene Supervision/safety;Sitting/lateral lean  Functional mobility during ADLs Min guard;Cueing for safety  Bed Mobility  Overal bed mobility Modified Independent  Transfers  Overall transfer level Needs assistance  Equipment used None  Transfers Sit to/from Stand  Sit to Stand Min guard  Balance  Overall balance assessment Needs assistance  Sitting-balance support Feet supported  Sitting balance-Leahy Scale Good  Standing balance support Single extremity supported;During functional activity  Standing balance-Leahy Scale Fair  General Comments  General comments (skin integrity, edema, etc.) VSS on RA, daughter present  OT - End of Session  Equipment Utilized During Treatment Gait belt  Activity Tolerance Patient tolerated treatment well  Patient left in chair;with chair alarm set;with family/visitor present  Nurse Communication Mobility status  OT Assessment  OT Recommendation/Assessment Patient needs continued OT Services  OT Visit Diagnosis Unsteadiness on feet (R26.81);Muscle weakness (generalized)  (M62.81)  OT Problem List Decreased  strength;Decreased range of motion;Decreased activity tolerance;Decreased safety awareness;Decreased cognition  OT Plan  OT Frequency (ACUTE ONLY) Min 2X/week  OT Treatment/Interventions (ACUTE ONLY) Self-care/ADL training;Therapeutic exercise;DME and/or AE instruction;Therapeutic activities;Patient/family education;Balance training  AM-PAC OT "6 Clicks" Daily Activity Outcome Measure (Version 2)  Help from another person eating meals? 4  Help from another person taking care of personal grooming? 3  Help from another person toileting, which includes using toliet, bedpan, or urinal? 3  Help from another person bathing (including washing, rinsing, drying)? 3  Help from another person to put on and taking off regular upper body clothing? 4  Help from another person to put on and taking off regular lower body clothing? 3  6 Click Score 20  Progressive Mobility  What is the highest level of mobility based on the progressive mobility assessment? Level 5 (Walks with assist in room/hall) - Balance while stepping forward/back and can walk in room with assist - Complete  Activity Ambulated with assistance in room;Ambulated with assistance to bathroom  OT Recommendation  Follow Up Recommendations No OT follow up  Assistance recommended at discharge PRN  Patient can return home with the following Direct supervision/assist for medications management;Direct supervision/assist for financial management;Assist for transportation  Functional Status Assessent Patient has had a recent decline in their functional status and demonstrates the ability to make significant improvements in function in a reasonable and predictable amount of time.  Individuals Consulted  Consulted and Agree with Results and Recommendations Patient  Acute Rehab OT Goals  Patient Stated Goal home  OT Goal Formulation With patient  Time For Goal Achievement 05/15/22  Potential to Achieve Goals Good   OT Time Calculation  OT Start Time (ACUTE ONLY) 0933  OT Stop Time (ACUTE ONLY) 1006  OT Time Calculation (min) 33 min  OT Evaluation  $OT Eval Moderate Complexity 1 Mod  OT Treatments  $Self Care/Home Management  8-22 mins  Written Expression  Dominant Hand Right

## 2022-05-01 NOTE — Discharge Summary (Addendum)
Physician Discharge Summary   Patient: [name] MRN: [id number] DOB: 04-08-1941  Admit date:     04/30/2022  Discharge date: 05/01/22  Discharge Physician: Elmarie Shiley   PCP: Reynold Bowen, MD   Recommendations at discharge:   Follow up with neurology for further evaluation mild cognitive impairment.  Patient on multiple blood pressure medication at home,  blood pressures during hospitalization between 120 and 100s without medications.   Plan only to resume her metoprolol.  Discharge Diagnoses: Principal Problem:   Aphasic agraphia Active Problems:   Memory loss or impairment   HTN (hypertension), benign   Hypothyroidism   Anxiety   GERD (gastroesophageal reflux disease)   TIA (transient ischemic attack)  Resolved Problems:   * No resolved hospital problems. *  Hospital Course: 81 year old with past medical history significant for MCI, fibromyalgia, hypertension, osteoarthritis, peptic ulcer disease, peripheral vascular disease presented to the emergency room for evaluation of writing difficulty and possible word finding difficulty as well.  MRI of the brain was negative for acute stroke.  EEG was also negative for seizures. Dr Erlinda Hong recommend patient to be on a baby aspirin and to follow-up with her neurologist Dr. Leonie Man  for further evaluation of cognitive impairment.  Blood pressure during hospitalization 100-120 without blood pressure medication.  Her blood pressure medications  has been adjusted.   Assessment and Plan: 1--Agraphia vs writing apraxia: TIA versus worsening cognitive status MRI negative for acute stroke Evaluated by neurology who recommended baby aspirin and initiation of Crestor LDL 80. Need to follow-up with outpatient neurologist ECHO normal EF  2 Hypertension: Blood pressure medication held on admission to allow permissive HTN<>  SBP remain 120--109 without medication.  She is on multiples medications. Plan to resume Metoprolol./  Hold  Norvasc and cozaar. Close follow up with PCP to resume meds as need, if BP increases.   3-Mild dementia: Need to follow-up with neurology Hypothyroidism: Continue with Synthroid GERD: Continue with PPI       Consultants: Neurology Procedures performed: Echo Disposition: Home Diet recommendation:  Discharge Diet Orders (From admission, onward)     Start     Ordered   05/01/22 0000  Diet - low sodium heart healthy        05/01/22 1320           Cardiac diet DISCHARGE MEDICATION: Allergies as of 05/01/2022       Reactions   Morphine And Related Itching, Rash   Given in IV.        Medication List     STOP taking these medications    amLODipine 5 MG tablet Commonly known as: NORVASC   furosemide 20 MG tablet Commonly known as: LASIX   losartan 100 MG tablet Commonly known as: Cozaar       TAKE these medications    ALPRAZolam 0.5 MG tablet Commonly known as: XANAX Take 1 tablet (0.5 mg total) by mouth 2 (two) times daily as needed. for anxiety What changed:  how much to take when to take this additional instructions   amitriptyline 25 MG tablet Commonly known as: ELAVIL TAKE 1 TABLET BY MOUTH EVERY DAY AT BEDTIME   aspirin EC 81 MG tablet Take 1 tablet (81 mg total) by mouth daily. Swallow whole. Start taking on: May 02, 2022   buPROPion 75 MG tablet Commonly known as: WELLBUTRIN Take 75 mg by mouth every morning.   calcium-vitamin D 500-200 MG-UNIT tablet Commonly known as: OSCAL WITH D Take 1 tablet by  mouth daily.   Citrucel oral powder Generic drug: methylcellulose 2 tablespoons daily   Collagen Hydrolysate (Bovine) Powd Take 1 Scoop by mouth daily.   DRY EYES OP Apply 1 drop to eye 3 (three) times daily as needed (for dry eyes).   DULoxetine 30 MG capsule Commonly known as: CYMBALTA Take 30 mg by mouth See admin instructions. Take 1 capsule (30 mg) combine with 1 capsule (60 mg) by mouth daily What changed: Another  medication with the same name was removed. Continue taking this medication, and follow the directions you see here.   levothyroxine 88 MCG tablet Commonly known as: SYNTHROID Take 88 mcg by mouth every morning.   meloxicam 7.5 MG tablet Commonly known as: MOBIC Take 7.5 mg by mouth daily.   metoprolol succinate 50 MG 24 hr tablet Commonly known as: TOPROL-XL Take 1 tablet (50 mg total) by mouth daily. Take with or immediately following a meal. NEED ANNUAL VISIT FOR FURTHER REFILLS   multivitamin with minerals tablet Take 1 tablet by mouth daily.   omeprazole 40 MG capsule Commonly known as: PRILOSEC Take 1 capsule (40 mg total) by mouth daily. NEED ANNUAL VISIT FOR FURTHER REFILLS What changed: additional instructions   Premarin vaginal cream Generic drug: conjugated estrogens Place 1 Application vaginally 2 (two) times a week.   rosuvastatin 10 MG tablet Commonly known as: Crestor Take 1 tablet (10 mg total) by mouth daily.   SENNA LAX PO Take 1 tablet by mouth daily.   traMADol 50 MG tablet Commonly known as: ULTRAM TAKE 1 TABLET BY MOUTH EVERY 8 HOURS AS NEEDED FOR MODERATE TO SEVERE PAIN. MAY TAKE 1 ADDITIONAL TABLET DAILY AS NEEDED FOR SEVERE PAIN What changed: See the new instructions.        Discharge Exam: Filed Weights   04/30/22 1104  Weight: 65.8 kg   General; NAD  Condition at discharge: stable  The results of significant diagnostics from this hospitalization (including imaging, microbiology, ancillary and laboratory) are listed below for reference.   Imaging Studies: ECHOCARDIOGRAM COMPLETE  Result Date: 05/01/2022    ECHOCARDIOGRAM REPORT   Patient Name:   [name] Date of Exam: 05/01/2022 Medical Rec #:  [id number]       Height:       62.0 in Accession #:    [id number]      Weight:       145.0 lb Date of Birth:  Jan 15, 1941       BSA:          1.667 m Patient Age:    81 years        BP:           116/50 mmHg Patient Gender: F                HR:           65 bpm. Exam Location:  Inpatient Procedure: 2D Echo, 3D Echo, Cardiac Doppler and Color Doppler Indications:    Stroke  History:        Patient has prior history of Echocardiogram examinations, most                 recent 05/17/2020. TIA; Risk Factors:Hypertension and Non-Smoker.  Sonographer:    Darlina Sicilian RDCS Referring Phys: [place]  1. Left ventricular ejection fraction, by estimation, is 60 to 65%. The left ventricle has normal function. The left ventricle has no regional wall motion abnormalities. Left ventricular diastolic parameters are indeterminate.  2. Right ventricular systolic function is normal. The right ventricular size is normal. Tricuspid regurgitation signal is inadequate for assessing PA pressure.  3. The mitral valve is normal in structure. Trivial mitral valve regurgitation. No evidence of mitral stenosis.  4. The aortic valve is tricuspid. There is mild calcification of the aortic valve. Aortic valve regurgitation is not visualized. Aortic valve sclerosis is present, with no evidence of aortic valve stenosis.  5. The inferior vena cava is normal in size with greater than 50% respiratory variability, suggesting right atrial pressure of 3 mmHg. Comparison(s): No significant change from prior study. Conclusion(s)/Recommendation(s): Normal biventricular function without evidence of hemodynamically significant valvular heart disease. No intracardiac source of embolism detected on this transthoracic study. Consider a transesophageal echocardiogram to exclude cardiac source of embolism if clinically indicated. FINDINGS  Left Ventricle: Left ventricular ejection fraction, by estimation, is 60 to 65%. The left ventricle has normal function. The left ventricle has no regional wall motion abnormalities. The global longitudinal strain is normal despite suboptimal segment tracking. The left ventricular internal cavity size was normal in size. There is no left  ventricular hypertrophy. Left ventricular diastolic parameters are indeterminate. Right Ventricle: The right ventricular size is normal. No increase in right ventricular wall thickness. Right ventricular systolic function is normal. Tricuspid regurgitation signal is inadequate for assessing PA pressure. Left Atrium: Left atrial size was normal in size. Right Atrium: Right atrial size was normal in size. Pericardium: There is no evidence of pericardial effusion. Mitral Valve: The mitral valve is normal in structure. Trivial mitral valve regurgitation. No evidence of mitral valve stenosis. Tricuspid Valve: The tricuspid valve is normal in structure. Tricuspid valve regurgitation is trivial. No evidence of tricuspid stenosis. Aortic Valve: The aortic valve is tricuspid. There is mild calcification of the aortic valve. Aortic valve regurgitation is not visualized. Aortic valve sclerosis is present, with no evidence of aortic valve stenosis. Pulmonic Valve: The pulmonic valve was grossly normal. Pulmonic valve regurgitation is not visualized. No evidence of pulmonic stenosis. Aorta: The aortic root, ascending aorta, aortic arch and descending aorta are all structurally normal, with no evidence of dilitation or obstruction. Venous: The inferior vena cava is normal in size with greater than 50% respiratory variability, suggesting right atrial pressure of 3 mmHg. IAS/Shunts: The atrial septum is grossly normal.  LEFT VENTRICLE PLAX 2D LVIDd:         4.90 cm   Diastology LVIDs:         3.30 cm   LV e' medial:    4.82 cm/s LV PW:         0.80 cm   LV E/e' medial:  15.6 LV IVS:        0.90 cm   LV e' lateral:   7.18 cm/s LVOT diam:     1.70 cm   LV E/e' lateral: 10.4 LV SV:         52 LV SV Index:   31 LVOT Area:     2.27 cm                           3D Volume EF:                          3D EF:        55 %  LV EDV:       112 ml                          LV ESV:       50 ml                          LV  SV:        62 ml RIGHT VENTRICLE RV S prime:     19.50 cm/s TAPSE (M-mode): 2.2 cm LEFT ATRIUM             Index        RIGHT ATRIUM           Index LA diam:        4.10 cm 2.46 cm/m   RA Area:     14.60 cm LA Vol (A2C):   36.4 ml 21.83 ml/m  RA Volume:   34.00 ml  20.39 ml/m LA Vol (A4C):   48.3 ml 28.97 ml/m LA Biplane Vol: 42.1 ml 25.25 ml/m  AORTIC VALVE LVOT Vmax:   106.00 cm/s LVOT Vmean:  76.600 cm/s LVOT VTI:    0.231 m  AORTA Ao Root diam: 3.00 cm Ao Asc diam:  2.80 cm MITRAL VALVE MV Area (PHT): 3.11 cm    SHUNTS MV Decel Time: 244 msec    Systemic VTI:  0.23 m MV E velocity: 75.00 cm/s  Systemic Diam: 1.70 cm MV A velocity: 58.70 cm/s MV E/A ratio:  1.28 Buford Dresser MD Electronically signed by Buford Dresser MD Signature Date/Time: 05/01/2022/12:45:33 PM    Final    EEG adult  Result Date: 05/01/2022 Lora Havens, MD     05/01/2022  9:35 AM Patient Name: Stephannie Broner MRN: [id number] Epilepsy Attending: Lora Havens Referring Physician/Provider: Amie Portland, MD Date: 04/30/2022 Duration: 23.07 mins Patient history: 81 yo F with ams. EEG to evaluate for seizure Level of alertness: Awake, asleep AEDs during EEG study: Xanax Technical aspects: This EEG study was done with scalp electrodes positioned according to the 10-20 International system of electrode placement. Electrical activity was reviewed with band pass filter of 1-'70Hz'$ , sensitivity of 7 uV/mm, display speed of 93m/sec with a '60Hz'$  notched filter applied as appropriate. EEG data were recorded continuously and digitally stored.  Video monitoring was available and reviewed as appropriate. Description: The posterior dominant rhythm consists of 8 Hz activity of moderate voltage (25-35 uV) seen predominantly in posterior head regions, symmetric and reactive to eye opening and eye closing. Sleep was characterized by vertex waves, sleep spindles (12 to 14 Hz), maximal frontocentral region. Hyperventilation and photic  stimulation were not performed.   IMPRESSION: This study is within normal limits.  No seizures or epileptiform discharges were seen during the study. A normal interictal EEG does not exclude nor support the diagnosis of epilepsy. PLora Havens  CT ANGIO HEAD NECK W WO CM  Result Date: 05/01/2022 CLINICAL DATA:  Initial evaluation for acute stroke. EXAM: CT ANGIOGRAPHY HEAD AND NECK TECHNIQUE: Multidetector CT imaging of the head and neck was performed using the standard protocol during bolus administration of intravenous contrast. Multiplanar CT image reconstructions and MIPs were obtained to evaluate the vascular anatomy. Carotid stenosis measurements (when applicable) are obtained utilizing NASCET criteria, using the distal internal carotid diameter as the denominator. RADIATION DOSE REDUCTION: This exam was performed according to the departmental dose-optimization program which includes automated exposure control,  adjustment of the mA and/or kV according to patient size and/or use of iterative reconstruction technique. CONTRAST:  48m OMNIPAQUE IOHEXOL 350 MG/ML SOLN COMPARISON:  Comparison made with prior CT from earlier the same day. FINDINGS: CT HEAD FINDINGS Brain: Cerebral volume within normal limits. Mild to moderate chronic microvascular ischemic disease noted. No acute intracranial hemorrhage. No acute large vessel territory infarct. No mass lesion or midline shift. No hydrocephalus or extra-axial fluid collection. Vascular: No abnormal hyperdense vessel. Skull: Scalp soft tissues and calvarium within normal limits. Sinuses/Orbits: Globes orbital soft tissues within normal limits. Paranasal sinuses and mastoid air cells are largely clear. Other: None. Review of the MIP images confirms the above findings CTA NECK FINDINGS Aortic arch: Visualized aortic arch normal in caliber with normal branch pattern. Minimal plaque within the arch itself for patient age. No stenosis about the origin the great  vessels. Right carotid system: The right common and internal carotid arteries patent without dissection or occlusion. No significant atheromatous narrowing about the right carotid bulb. 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 Left carotid system: Left common and internal carotid arteries patent without dissection or occlusion. Minimal for age plaque noted about the left carotid bulb without stenosis. Possible mild changes of FMD noted about the cervical left ICA. Vertebral arteries: Both vertebral arteries arise from the subclavian arteries. No proximal subclavian artery stenosis. Right vertebral artery dominant. Vertebral arteries widely patent without dissection or stenosis. 5 mm outpouching extending from the right V3 segment consistent with a small pseudoaneurysm (series 11, image 173). This is likely chronic in nature. Skeleton: No discrete or worrisome osseous lesions. Moderate spondylosis noted at C5-6 and C6-7. Other neck: No other acute soft tissue abnormality within the neck. Small lipoma noted at the left posterior neck. Upper chest: Visualized upper chest demonstrates no acute finding. Review of the MIP images confirms the above findings CTA HEAD FINDINGS Anterior circulation: Both internal carotid arteries widely patent to the termini without stenosis. A1 segments widely patent. Normal anterior communicating artery complex. Both anterior cerebral arteries widely patent to their distal aspects without stenosis. No M1 stenosis or occlusion. Normal MCA bifurcations. Distal MCA branches well perfused and symmetric. Posterior circulation: Dominant right V4 segment widely patent to the vertebrobasilar junction. Right PICA patent. Left vertebral artery largely terminates in PICA, although a tiny branch is seen ascending towards the vertebrobasilar junction. Left PICA patent as well. Basilar patent to its distal aspect without stenosis. Superior cerebral arteries patent  bilaterally. Right PCA supplied via the basilar. Predominant fetal type origin of the left PCA. Both PCAs patent distal aspects without stenosis. Venous sinuses: Patent allowing for timing the contrast bolus. Anatomic variants: As above. Review of the MIP images confirms the above findings IMPRESSION: CT HEAD IMPRESSION: 1. No acute intracranial abnormality. 2. 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 CTA HEAD AND NECK IMPRESSION: 1. Negative CTA for large vessel occlusion or other emergent finding. 2. Minimal atheromatous disease for age. No hemodynamically significant or correctable stenosis. 3. 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 4. 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 Electronically Signed   By: BJeannine BogaM.D.   On: 05/01/2022 06:35   MR BRAIN WO CONTRAST  Result Date: 05/01/2022 CLINICAL DATA:  Initial evaluation for neuro deficit, stroke suspected. EXAM: MRI HEAD WITHOUT CONTRAST TECHNIQUE: Multiplanar, multiecho pulse sequences of the brain and surrounding structures were obtained without intravenous contrast. COMPARISON:  Comparison  made with prior CTs from earlier the same day./2 FINDINGS: Brain: Cerebral volume within normal limits for age. Scattered patchy T2/FLAIR hyperintensity involving the periventricular and deep white matter both cerebral hemispheres as well as the pons, most consistent with chronic small vessel ischemic disease, mild to moderate in nature. No evidence for acute or subacute ischemia. Gray-white matter differentiation maintained. No areas of chronic cortical infarction. No acute or chronic intracranial blood products. No mass lesion, midline shift or mass effect. No hydrocephalus or extra-axial fluid collection. Pituitary gland and suprasellar region within normal limits. Vascular: Major intracranial vascular flow voids are maintained. Skull and upper  cervical spine: Basal junction within normal limits. Bone marrow signal intensity normal. No scalp soft tissue abnormality. Sinuses/Orbits: Patient status post bilateral ocular lens replacement. Mild scattered mucosal thickening noted about the ethmoidal air cells. Paranasal sinuses are otherwise largely clear. No mastoid effusion. Other: None. IMPRESSION: 1. No acute intracranial infarct or other abnormality. 2. 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 Electronically Signed   By: Jeannine Boga M.D.   On: 05/01/2022 06:15   DG Chest 2 View  Result Date: 04/30/2022 CLINICAL DATA:  TIA. EXAM: CHEST - 2 VIEW COMPARISON:  May 18, 2020 FINDINGS: The heart size and mediastinal contours are within normal limits. Both lungs are clear. Bilateral breast implants are noted. The visualized skeletal structures are unremarkable. IMPRESSION: No active cardiopulmonary disease. Electronically Signed   By: Virgina Norfolk M.D.   On: 04/30/2022 22:22   CT Head Wo Contrast  Result Date: 04/30/2022 CLINICAL DATA:  Altered mentation writing, and gait EXAM: CT HEAD WITHOUT CONTRAST TECHNIQUE: Contiguous axial images were obtained from the base of the skull through the vertex without intravenous contrast. RADIATION DOSE REDUCTION: This exam was performed according to the departmental dose-optimization program which includes automated exposure control, adjustment of the mA and/or kV according to patient size and/or use of iterative reconstruction technique. COMPARISON:  05/11/2020 CT head, correlation is also made with MRI head 08/07/2020 FINDINGS: Brain: No evidence of acute infarction, hemorrhage, mass, mass effect, or midline shift. No hydrocephalus or extra-axial fluid collection. Normal cerebral volume for age. Periventricular white matter changes, likely the sequela of chronic small vessel ischemic disease. Vascular: No hyperdense vessel. Skull: Stable small bilateral skull exostoses. Negative for  fracture or new focal lesion. Sinuses/Orbits: No acute finding. Other: The mastoid air cells are well aerated. IMPRESSION: No acute intracranial process. Electronically Signed   By: Merilyn Baba M.D.   On: 04/30/2022 12:27   US Venous Img Lower Right (DVT Study)  Result Date: 04/12/2022 CLINICAL DATA:  Edema of the lower extremity. EXAM: RIGHT LOWER EXTREMITY VENOUS DOPPLER ULTRASOUND TECHNIQUE: Gray-scale sonography with compression, as well as color and duplex ultrasound, were performed to evaluate the deep venous system(s) from the level of the common femoral vein through the popliteal and proximal calf veins. COMPARISON:  None Available. FINDINGS: VENOUS Normal compressibility of the common femoral, superficial femoral, and popliteal veins, as well as the visualized calf veins. Visualized portions of profunda femoral vein and great saphenous vein unremarkable. No filling defects to suggest DVT on grayscale or color Doppler imaging. Doppler waveforms show normal direction of venous flow, normal respiratory plasticity and response to augmentation. Limited views of the contralateral common femoral vein are unremarkable. OTHER Edema noted throughout the lower leg. Limitations: none IMPRESSION: Negative for sonographic evidence of DVT in the RIGHT lower extremity. Edema in the RIGHT lower leg. Electronically Signed   By: Jewel Baize.D.  On: 04/12/2022 13:52   VAS [place] LOWER EXTREMITY VENOUS REFLUX  Result Date: 04/03/2022  Lower Venous Reflux Study Patient Name:  AMANDAMARIE FEGGINS  Date of Exam:   04/03/2022 Medical Rec #: [id number]        Accession #:    [id number] Date of Birth: Nov 10, 1940        Patient Gender: F Patient Age:   46 years Exam Location:  [facility] Procedure:      VAS [place] LOWER EXTREMITY VENOUS REFLUX Referring Phys: Jamelle Haring --------------------------------------------------------------------------------  Performing Technologist: Elta Guadeloupe RVT, RDMS  Examination  Guidelines: A complete evaluation includes B-mode imaging, spectral Doppler, color Doppler, and power Doppler as needed of all accessible portions of each vessel. Bilateral testing is considered an integral part of a complete examination. Limited examinations for reoccurring indications may be performed as noted. The reflux portion of the exam is performed with the patient in reverse Trendelenburg. Significant venous reflux is defined as >500 ms in the superficial venous system, and >1 second in the deep venous system.  Venous Reflux Times +--------------+---------+------+-----------+------------+--------+ RIGHT         Reflux NoRefluxReflux TimeDiameter cmsComments                         Yes                                  +--------------+---------+------+-----------+------------+--------+ CFV                     yes   >1 second                      +--------------+---------+------+-----------+------------+--------+ FV prox       no                                             +--------------+---------+------+-----------+------------+--------+ FV mid                  yes   >1 second                      +--------------+---------+------+-----------+------------+--------+ FV dist                 yes   >1 second                      +--------------+---------+------+-----------+------------+--------+ Popliteal     no                                             +--------------+---------+------+-----------+------------+--------+ GSV at SFJ              yes    >500 ms      0.46             +--------------+---------+------+-----------+------------+--------+ GSV prox thigh          yes    >500 ms      0.38             +--------------+---------+------+-----------+------------+--------+ GSV mid thigh no  0.25             +--------------+---------+------+-----------+------------+--------+ GSV dist thighno                             0.26             +--------------+---------+------+-----------+------------+--------+ GSV at knee   no                            0.25             +--------------+---------+------+-----------+------------+--------+ GSV prox calf           yes    >500 ms      0.14             +--------------+---------+------+-----------+------------+--------+ SSV Pop Fossa no                            0.12             +--------------+---------+------+-----------+------------+--------+ SSV prox calf no                            0.13             +--------------+---------+------+-----------+------------+--------+   Summary: Right: - No evidence of deep vein thrombosis seen in the right lower extremity, from the common femoral through the popliteal veins. - No evidence of superficial venous thrombosis in the right lower extremity. - Venous reflux is noted in the right common femoral vein. - Venous reflux is noted in the right sapheno-femoral junction. - Venous reflux is noted in the right greater saphenous vein in the thigh. - Venous reflux is noted in the right greater saphenous vein in the calf. - Venous reflux is noted in the right femoral vein.  - Incidental finding of complex heterogeneous cystic structure in the Right Popliteal fossa. 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  *See table(s) above for measurements and observations. Electronically signed by Servando Snare MD on 04/03/2022 at 4:45:09 PM.    Final     Microbiology: Results for orders placed or performed during the hospital encounter of 05/11/20  Respiratory Panel by RT PCR (Flu A&B, Covid) - Nasopharyngeal Swab     Status: None   Collection Time: 05/11/20 12:08 PM   Specimen: Nasopharyngeal Swab  Result Value Ref Range Status   SARS Coronavirus 2 by RT PCR NEGATIVE NEGATIVE Final    Comment: (NOTE) SARS-CoV-2 target nucleic acids are NOT DETECTED.  The SARS-CoV-2 RNA is generally detectable in upper  respiratoy specimens during the acute phase of infection. The lowest concentration of SARS-CoV-2 viral copies this assay can detect is 131 copies/mL. A negative result does not preclude SARS-Cov-2 infection and should not be used as the sole basis for treatment or other patient management decisions. A negative result may occur with  improper specimen collection/handling, submission of specimen other than nasopharyngeal swab, presence of viral mutation(s) within the areas targeted by this assay, and inadequate number of viral copies (<131 copies/mL). A negative result must be combined with clinical observations, patient history, and epidemiological information. The expected result is Negative.  Fact Sheet for Patients:  PinkCheek.be  Fact Sheet for Healthcare Providers:  GravelBags.it  This test is no t yet approved or cleared by the [place] FDA and  has been authorized for detection and/or diagnosis  of SARS-CoV-2 by FDA under an Emergency Use Authorization (EUA). This EUA will remain  in effect (meaning this test can be used) for the duration of the COVID-19 declaration under Section 564(b)(1) of the Act, 21 U.S.C. section 360bbb-3(b)(1), unless the authorization is terminated or revoked sooner.     Influenza A by PCR NEGATIVE NEGATIVE Final   Influenza B by PCR NEGATIVE NEGATIVE Final    Comment: (NOTE) The Xpert Xpress SARS-CoV-2/FLU/RSV assay is intended as an aid in  the diagnosis of influenza from Nasopharyngeal swab specimens and  should not be used as a sole basis for treatment. Nasal washings and  aspirates are unacceptable for Xpert Xpress SARS-CoV-2/FLU/RSV  testing.  Fact Sheet for Patients: PinkCheek.be  Fact Sheet for Healthcare Providers: GravelBags.it  This test is not yet approved or cleared by the [place] FDA and  has been  authorized for detection and/or diagnosis of SARS-CoV-2 by  FDA under an Emergency Use Authorization (EUA). This EUA will remain  in effect (meaning this test can be used) for the duration of the  Covid-19 declaration under Section 564(b)(1) of the Act, 21  U.S.C. section 360bbb-3(b)(1), unless the authorization is  terminated or revoked. Performed at [facility], [place] [address], [place], [place] [address]   Blood culture (routine x 2)     Status: None   Collection Time: 05/11/20 10:00 PM   Specimen: BLOOD RIGHT FOREARM  Result Value Ref Range Status   Specimen Description BLOOD RIGHT FOREARM  Final   Special Requests   Final    BOTTLES DRAWN AEROBIC AND ANAEROBIC Blood Culture results may not be optimal due to an inadequate volume of blood received in culture bottles   Culture   Final    NO GROWTH 5 DAYS Performed at [facility], [place] [address] [place], [place] [address]    Report Status 05/16/2020 FINAL  Final  Blood culture (routine x 2)     Status: None   Collection Time: 05/11/20 10:10 PM   Specimen: BLOOD  Result Value Ref Range Status   Specimen Description BLOOD RIGHT ANTECUBITAL  Final   Special Requests   Final    BOTTLES DRAWN AEROBIC AND ANAEROBIC Blood Culture adequate volume   Culture   Final    NO GROWTH 5 DAYS Performed at [facility], [place] [address] [place],  [address]    Report Status 05/16/2020 FINAL  Final    Labs: CBC: Recent Labs  Lab 04/30/22 1247 05/01/22 0757  WBC 6.1 4.7  NEUTROABS 3.6  --   HGB 13.2 11.9*  HCT 40.2 35.9*  MCV 93.1 93.7  PLT 266 562   Basic Metabolic Panel: Recent Labs  Lab 04/30/22 1247  NA 136  K 3.9  CL 102  CO2 25  GLUCOSE 102*  BUN 21  CREATININE 0.70  CALCIUM 9.2   Liver Function Tests: No results for input(s): "AST", "ALT", "ALKPHOS", "BILITOT", "PROT", "ALBUMIN" in the last 168 hours. CBG: No results for input(s): "GLUCAP" in the last 168 hours.  Discharge time spent:  greater than 30 minutes.  Signed: Elmarie Shiley, MD [facility] 05/01/2022

## 2022-05-03 LAB — VITAMIN B1: Vitamin B1 (Thiamine): 177.6 nmol/L (ref 66.5–200.0)

## 2022-05-09 ENCOUNTER — Telehealth: Payer: Self-pay | Admitting: Adult Health

## 2022-05-09 NOTE — Telephone Encounter (Signed)
Review of recent MRI impression report does not show any major changes compared to prior imaging. Would request this patient have f/u with Dr. Leonie Man to further review MRI and discuss with patient. Dr. Leonie Man has work in [place] on 10/5. Can patient please be scheduled in this slot? Thank you!

## 2022-05-09 NOTE — Telephone Encounter (Signed)
Pt daughter Veterinary surgeon) called. Stated Pt is having major memory issues, stated Pt was in the hospital a week ago and she was told Pt brain scan showed major brain changes. [name] said Pt has an appointment with Dr. Leonie Man on 11/13 but she can't wait until Nov and wants to see the P.A.until she can see Dr. Leonie Man.

## 2022-05-09 NOTE — Telephone Encounter (Signed)
Pt called and accepted appt with Dr. Leonie Man for 05/16/2022.

## 2022-05-09 NOTE — Telephone Encounter (Signed)
Patient has FU with Fara Olden NP on 05/14/22.

## 2022-05-14 ENCOUNTER — Telehealth: Payer: Self-pay | Admitting: *Deleted

## 2022-05-14 ENCOUNTER — Ambulatory Visit: Payer: Medicare Other | Admitting: Adult Health

## 2022-05-14 NOTE — Telephone Encounter (Signed)
Returning [name]'s phone message regarding estimate of cost for laser ablation procedure (out-of-pocket) as patient wants to investigate the possibility of not going through the 3 months of conservative treatment that insurance requires.  Gave her the out of pocket cost for laser ablation $4750.  [name] is scheduled for 3 month VV FU with Dr. Trula Slade on 07-29-2022. Encouraged [name] to continue elevating her legs and wearing her thigh high compression hose.  Encouraged her to investigate alternate sources of leg pain while she waits for follow up.  [name] states she has pain on bottom of her feet and occasional difficulty with ambulating which laser ablation may not improve.  Her venous reflux study from 04-03-2022 demonstrated some reflux in greater saphenous vein but her vein diameters in great and small saphenous veins were minimally dilated.  Discussed with  [name] that the MD would make the final determination via office visit if laser ablation was or was not the best treatment option for her. [name] verbalized understanding.

## 2022-05-16 ENCOUNTER — Ambulatory Visit: Payer: Medicare Other | Admitting: Neurology

## 2022-05-16 ENCOUNTER — Encounter: Payer: Self-pay | Admitting: Neurology

## 2022-05-16 VITALS — BP 122/65 | HR 52 | Ht 62.0 in | Wt 153.2 lb

## 2022-05-16 DIAGNOSIS — G301 Alzheimer's disease with late onset: Secondary | ICD-10-CM | POA: Diagnosis not present

## 2022-05-16 DIAGNOSIS — R413 Other amnesia: Secondary | ICD-10-CM

## 2022-05-16 DIAGNOSIS — G459 Transient cerebral ischemic attack, unspecified: Secondary | ICD-10-CM

## 2022-05-16 DIAGNOSIS — F02A4 Dementia in other diseases classified elsewhere, mild, with anxiety: Secondary | ICD-10-CM | POA: Diagnosis not present

## 2022-05-16 MED ORDER — DONEPEZIL HCL 5 MG PO TABS: 10.0000 mg | ORAL_TABLET | Freq: Every day | ORAL | 3 refills | Status: DC

## 2022-05-16 NOTE — Patient Instructions (Signed)
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  I recommend treatment trial of Aricept and begin 5 mg daily for a month to be increased to 10 mg daily if tolerated without side effects.  I also encouraged her to increase participation in cognitively challenging activities like solving crossword puzzles, playing bridge and sudoku.  We also discussed memory compensation strategies.  She will continue aspirin or stroke   Prevention and maintain aggressive risk factor modification..  Return for follow-up in 3 months or call earlier if necessary.  Memory Compensation Strategies  Use "WARM" strategy.  W= write it down  A= associate it  R= repeat it  M= make a mental note  2.   You can keep a Social worker.  Use a 3-ring notebook with sections for the following: calendar, important names and phone numbers,  medications, doctors' names/phone numbers, lists/reminders, and a section to journal what you did  each day.   3.    Use a calendar to write appointments down.  4.    Write yourself a schedule for the day.  This can be placed on the calendar or in a separate section of the Memory Notebook.  Keeping a  regular schedule can help memory.  5.    Use medication organizer with sections for each day or morning/evening pills.  You may need help loading it  6.    Keep a basket, or pegboard by the door.  Place items that you need to take out with you in the basket or on the pegboard.  You may also want to  include a message board for reminders.  7.    Use sticky notes.  Place sticky notes with reminders in a place where the task is performed.  For example: " turn off the  stove" placed by the stove, "lock the door" placed on the door at eye level, " take your medications" on  the bathroom mirror or by the place where you normally  take your medications.  8.    Use alarms/timers.  Use while cooking to remind yourself to check on food or as a reminder to take your medicine, or as a  reminder to make a call, or as a reminder to perform another task, etc.

## 2022-05-16 NOTE — Progress Notes (Signed)
[facility] [address] [place]. [place] [address] [phone]       OFFICE FOLLOW UP VISIT NOTE  [name] Date of Birth:  1940/09/05 Medical Record Number:  [id number]   Referring MD: Reynold Bowen Reason for Referral: Memory and cognitive difficulties   Chief Complaint  Patient presents with   Follow-up    Rm 7. Was writing checks and reports she was unable to, making swiggly lines. C/o some speech difficulties. Distinct change in memory over the past 3 months. Has anger outbursts. Currently driving and taking care of her own finances.      HPI:  Update 05/16/2022 : She returns for follow-up after last visit 6 months ago with [place] practitioner.  Patient was in the hospital on 04/30/22 for  Recent cognitive worsening repeatedly getting locked out of her bank account because of trouble writing  a text message and sending an email.  This confusion and disorientation lasted a few hours so she came to the ER for evaluation.  MRI scan of the brain was obtained on 04/16/2018 which showed no acute abnormality.  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  Echocardiogram showed ejection fraction of 60 to 65%.  LDL cholesterol is 80 mg percent.  Hemoglobin A1c was 5.7.  Lab work for vitamin B12, B1, ammonia and RPR were all normal patient started has noted that there has been definite cognitive decline over the last 2 to 3 months.  Loses track of time complete can you move currently.  She has been driving alright and so far has not gotten lost.  She has longstanding history of mild cognitive impairment and I have started her on Cerefolin NAC which she took for a while but decided to stop it Update 10/24/2021 JM: 81 year old female with hx of MCI who returned for follow up visit.  Doing well since prior visit and reports improvement of cognition. She  will have occasional "age related" short term memory issues or delayed recall but overall stable.  She no longer takes Cerefolin.  Lives alone, independent for ADLs and IADLs and drives.  Has remained healthy throughout the winter maintaining a good diet and adequate/routine exercise. Follows closely with PCP Dr. Forde Dandy with recent lab work. No new concerns at this time.      History provided for reference purposes only  Update 02/19/2021 Dr. Leonie Man: She returns for follow-up after last visit 7 months ago.  Patient states she is noted some improvement in her memory and cognitive difficulties since last visit.  She has been doing regular crossword puzzles as well as online brain games.  She is also been taking Cerefolin NAC daily and tolerating it well.  She is also been quite active and walks 45 minutes daily as well as does daily squats to strengthen her thigh muscles which helps getting up.  She is independent in actives of daily he l she lives alone.  She has no new complaints.  She did undergo lab work on 07/24/2020 and vitamin B12, TSH, homocystine and RPR were all normal.  EEG on 08/07/2020 was normal.  MRI scan of the brain on 08/10/2020 shows mild age-related changes of chronic small vessel disease and atrophy.  No acute abnormalities were noted.  Initial visit 07/24/2020 Dr. Leonie Man :[name] is a pleasant 81 year old Caucasian lady seen today for initial office consultation visit.  She is accompanied by her daughter Donella Stade.  History is obtained from them, review of referral notes and I personally reviewed available imaging films in PACS. She has past medical history of hypertension, osteoarthritis, peptic ulcer and peripheral vascular disease.  She states for a year or so she has noticed some cognitive difficulties.  She has trouble remembering recent information as well as trouble with concentration and staying on task and often gets distracted.  She is not able to complete tasks well.  She feels  her symptoms seem to have gotten worse the last couple of months after she she was admitted for pneumonia in the hospital.  Since then she was quite deconditioned and required physical therapy for ambulation has gradually improved and graduated from using a walker to a cane and now can walk independently.  She still feels slightly off balance and fell twice while bending down but fortunately did not hurt herself.  She used to get headaches a year ago which was severe but of late she is not having bad headaches.  She denies any prior history of stroke, TIA, seizures, loss of consciousness or significant head injury.  There is no family history of dementia except in the maternal grandmother late in life.  She has no other new complaints today.  She actually did quite well on Mini-Mental status testing today and scored 30/30.  Geriatric depression scale she scored 5 which is borderline for depression.       ROS:   14 system review of systems is positive for those as in HPI and all other systems negative PMH:  Past Medical History:  Diagnosis Date   Cancer ([place]) 2014   kidney   Esophageal stricture    Fibromyalgia    GERD (gastroesophageal reflux disease)    Helicobacter pylori gastritis    Hypertension    Intention tremor    Lumbago    Memory loss    Osteoarthritis of spine    Pneumonia    hx of years ago    PUD (peptic ulcer disease)    PVD (peripheral vascular disease) ([place])     Social History:  Social History   Socioeconomic History   Marital status: Widowed    Spouse name: Not on file   Number of children: 2   Years of education: BA   Highest education level: Not on file  Occupational History   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  Tobacco Use   Smoking status: Never   Smokeless tobacco: Never  Vaping Use   Vaping Use: Never used  Substance and Sexual Activity   Alcohol use: Yes    Alcohol/week: 7.0 standard drinks of  alcohol    Types: 7 Glasses of wine per week    Comment: occasional glass of wine with dinner    Drug use: No   Sexual activity: Not Currently  Other Topics Concern   Not on file  Social History Narrative   Lives alone   Right Handed   Drinks 2 cups coffee daily   Social Determinants of Health   Financial Resource Strain: Not on file  Food Insecurity: Not on file  Transportation Needs: Not on file  Physical Activity: Not on file  Stress: Not on file  Social Connections: Not on file  Intimate Partner Violence: Not on file    Medications:   Current Outpatient Medications on File Prior to Visit  Medication Sig Dispense Refill   ALPRAZolam Duanne Moron)  0.5 MG tablet Take 1 tablet (0.5 mg total) by mouth 2 (two) times daily as needed. for anxiety (Patient taking differently: Take 1 mg by mouth at bedtime.) 60 tablet 1   amitriptyline (ELAVIL) 25 MG tablet TAKE 1 TABLET BY MOUTH EVERY DAY AT BEDTIME (Patient taking differently: Take 25 mg by mouth at bedtime.) 90 tablet 0   Artificial Tear Ointment (DRY EYES OP) Apply 1 drop to eye 3 (three) times daily as needed (for dry eyes).     aspirin EC 81 MG tablet Take 1 tablet (81 mg total) by mouth daily. Swallow whole. 30 tablet 12   buPROPion (WELLBUTRIN) 75 MG tablet Take 75 mg by mouth every morning.     calcium-vitamin D (OSCAL WITH D) 500-200 MG-UNIT per tablet Take 1 tablet by mouth daily.     Collagen Hydrolysate, Bovine, POWD Take 1 Scoop by mouth daily.     DULoxetine (CYMBALTA) 30 MG capsule Take 30 mg by mouth See admin instructions. Take 1 capsule (30 mg) combine with 1 capsule (60 mg) by mouth daily     levothyroxine (SYNTHROID) 88 MCG tablet Take 88 mcg by mouth every morning.     meloxicam (MOBIC) 7.5 MG tablet Take 7.5 mg by mouth daily.   2   methylcellulose (CITRUCEL) oral powder 2 tablespoons daily     metoprolol succinate (TOPROL-XL) 50 MG 24 hr tablet Take 1 tablet (50 mg total) by mouth daily. Take with or immediately  following a meal. NEED ANNUAL VISIT FOR FURTHER REFILLS 90 tablet 0   Multiple Vitamins-Minerals (MULTIVITAMIN WITH MINERALS) tablet Take 1 tablet by mouth daily.     omeprazole (PRILOSEC) 40 MG capsule Take 1 capsule (40 mg total) by mouth daily. NEED ANNUAL VISIT FOR FURTHER REFILLS (Patient taking differently: Take 40 mg by mouth daily.) 90 capsule 0   PREMARIN vaginal cream Place 1 Application vaginally 2 (two) times a week.     rosuvastatin (CRESTOR) 10 MG tablet Take 1 tablet (10 mg total) by mouth daily. 30 tablet 11   Sennosides (SENNA LAX PO) Take 1 tablet by mouth daily.     traMADol (ULTRAM) 50 MG tablet TAKE 1 TABLET BY MOUTH EVERY 8 HOURS AS NEEDED FOR MODERATE TO SEVERE PAIN. MAY TAKE 1 ADDITIONAL TABLET DAILY AS NEEDED FOR SEVERE PAIN (Patient taking differently: Take 50 mg by mouth every 8 (eight) hours as needed for moderate pain or severe pain. May take 1 additional tablet daily as needed for severe pain) 100 tablet 0   No current facility-administered medications on file prior to visit.    Allergies:   Allergies  Allergen Reactions   Morphine And Related Itching and Rash    Given in IV.    Physical Exam Today's Vitals   05/16/22 0925  BP: 122/65  Pulse: (!) 52  Weight: 153 lb 3.2 oz (69.5 kg)  Height: '5\' 2"'$  (1.575 m)   Body mass index is 28.02 kg/m.  General: well developed, well nourished very pleasant elderly Caucasian lady, seated, in no evident distress Head: head normocephalic and atraumatic.   Neck: supple with no carotid or supraclavicular bruits Cardiovascular: regular rate and rhythm, no murmurs Musculoskeletal: no deformity Skin:  no rash/petichiae Vascular:  Normal pulses all extremities  Neurologic Exam Mental Status: Awake and fully alert.  Fluent speech and language.  Oriented to place and time. Recent and remote memory intact. Attention span, concentration and fund of knowledge appropriate. Mood and affect appropriate.  Diminished recall 2/3.  Able to name 13 animals which can walk on 4 legs.  Clock drawing 3/4    05/16/2022    9:29 AM 10/25/2021    8:11 AM 07/24/2020   10:30 AM  MMSE - Mini Mental State Exam  Orientation to time '4 5 5  '$ Orientation to Place '5 5 5  '$ Registration '3 3 3  '$ Attention/ Calculation 2 0 1  Recall '2 1 3  '$ Language- name 2 objects '2 2 2  '$ Language- repeat '1 1 1  '$ Language- follow 3 step command '3 3 3  '$ Language- read & follow direction '1 1 1  '$ Write a sentence '1 1 1  '$ Copy design '1 1 1  '$ Total score '25 23 26   '$ Cranial Nerves: Pupils equal, briskly reactive to light. Extraocular movements full without nystagmus. Visual fields full to confrontation. Hearing intact. Facial sensation intact. Face, tongue, palate moves normally and symmetrically.  Motor: Normal bulk and tone. Normal strength in all tested extremity muscles. Sensory.: intact to touch , pinprick , position and vibratory sensation.  Coordination: Rapid alternating movements normal in all extremities. Finger-to-nose and heel-to-shin performed accurately bilaterally. Gait and Station: Arises from chair without difficulty. Stance is normal. Gait demonstrates normal stride length and balance . Able to heel, toe and tandem walk with mild difficulty.  Reflexes: 1+ and symmetric. Toes downgoing.       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  I recommend treatment trial of Aricept and begin 5 mg daily for a month to be increased to 10 mg daily if tolerated without side effects.  I also encouraged  her to increase participation in cognitively challenging activities like solving crossword puzzles, playing bridge and sudoku.  We also discussed memory compensation strategies.  She will continue aspirin or stroke  for stroke prevention and maintain aggressive risk factor modification..  Return for follow-up in 3 months or call earlier if necessary.  Greater than 50% time during this 45-minute visit was spent on counseling and coordination of care about TIA as well as memory loss and cognitive worsening Antony Contras, MD [facility] [address] [place], [place] [address]  Phone [phone] Fax [phone] Note: This document was prepared with digital dictation and possible smart phrase technology. Any transcriptional errors that result from this process are unintentional.

## 2022-05-21 ENCOUNTER — Telehealth: Payer: Self-pay | Admitting: Neurology

## 2022-05-21 ENCOUNTER — Other Ambulatory Visit: Payer: Self-pay | Admitting: Neurology

## 2022-05-21 MED ORDER — DONEPEZIL HCL 5 MG PO TABS: 5.0000 mg | ORAL_TABLET | Freq: Every day | ORAL | 0 refills | Status: DC

## 2022-05-21 NOTE — Telephone Encounter (Signed)
I called the pt. Aricept 5 mg bid is not approved through insurance. I called pt and advised we would send 5 mg 1 per day at bed time and she will take this dosage for 4 weeks.   If tolerating well and w/o s/e we will increase to 10 mg once at bedtime.   She will call us back at the end of the 4 weeks to report how the medication is working for her.

## 2022-05-21 NOTE — Telephone Encounter (Signed)
Pt is calling. State her insurance will only pay for her to take one pill of donepezil (ARICEPT) 5 MG tablet. Pt is requesting another prescription for one pill be sent to pharmacy.

## 2022-06-06 ENCOUNTER — Other Ambulatory Visit: Payer: Self-pay | Admitting: Neurology

## 2022-06-12 ENCOUNTER — Encounter: Payer: Self-pay | Admitting: Neurology

## 2022-06-14 ENCOUNTER — Telehealth: Payer: Self-pay | Admitting: Neurology

## 2022-06-14 NOTE — Telephone Encounter (Deleted)
Pt is calling. Stated she is taking medication onetime per day. Pt said medication is interfering with her sleep no matter when she takes it.

## 2022-06-14 NOTE — Telephone Encounter (Signed)
Pt is calling. Stated she is taking medication onetime per day. Pt said medication is interfering with her sleep no matter when she takes it.

## 2022-06-17 ENCOUNTER — Ambulatory Visit
Admission: RE | Admit: 2022-06-17 | Discharge: 2022-06-17 | Disposition: A | Discharge status: Home / Self Care | Payer: Medicare Other | Source: Ambulatory Visit | Attending: Urgent Care | Admitting: Urgent Care

## 2022-06-17 ENCOUNTER — Other Ambulatory Visit: Payer: Self-pay

## 2022-06-17 VITALS — BP 136/55 | HR 58 | Temp 98.0°F | Resp 18 | Ht 62.0 in | Wt 140.0 lb

## 2022-06-17 DIAGNOSIS — R8290 Unspecified abnormal findings in urine: Secondary | ICD-10-CM | POA: Diagnosis not present

## 2022-06-17 DIAGNOSIS — B3731 Acute candidiasis of vulva and vagina: Secondary | ICD-10-CM

## 2022-06-17 LAB — POCT URINALYSIS DIP (MANUAL ENTRY)
Bilirubin, UA: NEGATIVE
Blood, UA: NEGATIVE
Glucose, UA: NEGATIVE mg/dL
Ketones, POC UA: NEGATIVE mg/dL
Nitrite, UA: NEGATIVE
Protein Ur, POC: NEGATIVE mg/dL
Spec Grav, UA: <=1.005 — AB (ref 1.010–1.025)
Urobilinogen, UA: 0.2 E.U./dL
pH, UA: 6 (ref 5.0–8.0)

## 2022-06-17 MED ORDER — FLUCONAZOLE 150 MG PO TABS: 150.0000 mg | ORAL_TABLET | ORAL | 0 refills | Status: DC

## 2022-06-17 NOTE — ED Triage Notes (Signed)
Pt c/o vaginal itching and "bumps" x ~3 days-denies vaginal discharge/urinary sx-states she does have foul smell-NAD-steady gait

## 2022-06-17 NOTE — ED Provider Notes (Signed)
[facility]  Note:  This document was prepared using Systems analyst and may include unintentional dictation errors.  MRN: [id number] DOB: 1941/04/07  Subjective:   Airiel Oblinger is a 81 y.o. female presenting for 3-day history of acute onset vaginal itching and slight discharge, feels like she has bumps in the area.  Has strong odor to her urine.  No urinary frequency, dysuria, hematuria.  She has not taken any medications for her symptoms.  No current facility-administered medications for this encounter.  Current Outpatient Medications:    ALPRAZolam (XANAX) 0.5 MG tablet, Take 1 tablet (0.5 mg total) by mouth 2 (two) times daily as needed. for anxiety (Patient taking differently: Take 1 mg by mouth at bedtime.), Disp: 60 tablet, Rfl: 1   amitriptyline (ELAVIL) 25 MG tablet, TAKE 1 TABLET BY MOUTH EVERY DAY AT BEDTIME (Patient taking differently: Take 25 mg by mouth at bedtime.), Disp: 90 tablet, Rfl: 0   Artificial Tear Ointment (DRY EYES OP), Apply 1 drop to eye 3 (three) times daily as needed (for dry eyes)., Disp: , Rfl:    aspirin EC 81 MG tablet, Take 1 tablet (81 mg total) by mouth daily. Swallow whole., Disp: 30 tablet, Rfl: 12   buPROPion (WELLBUTRIN) 75 MG tablet, Take 75 mg by mouth every morning., Disp: , Rfl:    calcium-vitamin D (OSCAL WITH D) 500-200 MG-UNIT per tablet, Take 1 tablet by mouth daily., Disp: , Rfl:    Collagen Hydrolysate, Bovine, POWD, Take 1 Scoop by mouth daily., Disp: , Rfl:    donepezil (ARICEPT) 5 MG tablet, TAKE 1 TABLET(5 MG) BY MOUTH AT BEDTIME, Disp: 90 tablet, Rfl: 1   DULoxetine (CYMBALTA) 30 MG capsule, Take 30 mg by mouth See admin instructions. Take 1 capsule (30 mg) combine with 1 capsule (60 mg) by mouth daily, Disp: , Rfl:    levothyroxine (SYNTHROID) 88 MCG tablet, Take 88 mcg by mouth every morning., Disp: , Rfl:    meloxicam (MOBIC) 7.5 MG tablet, Take 7.5 mg by mouth daily. , Disp: , Rfl: 2    methylcellulose (CITRUCEL) oral powder, 2 tablespoons daily, Disp: , Rfl:    metoprolol succinate (TOPROL-XL) 50 MG 24 hr tablet, Take 1 tablet (50 mg total) by mouth daily. Take with or immediately following a meal. NEED ANNUAL VISIT FOR FURTHER REFILLS, Disp: 90 tablet, Rfl: 0   Multiple Vitamins-Minerals (MULTIVITAMIN WITH MINERALS) tablet, Take 1 tablet by mouth daily., Disp: , Rfl:    omeprazole (PRILOSEC) 40 MG capsule, Take 1 capsule (40 mg total) by mouth daily. NEED ANNUAL VISIT FOR FURTHER REFILLS (Patient taking differently: Take 40 mg by mouth daily.), Disp: 90 capsule, Rfl: 0   PREMARIN vaginal cream, Place 1 Application vaginally 2 (two) times a week., Disp: , Rfl:    rosuvastatin (CRESTOR) 10 MG tablet, Take 1 tablet (10 mg total) by mouth daily., Disp: 30 tablet, Rfl: 11   Sennosides (SENNA LAX PO), Take 1 tablet by mouth daily., Disp: , Rfl:    traMADol (ULTRAM) 50 MG tablet, TAKE 1 TABLET BY MOUTH EVERY 8 HOURS AS NEEDED FOR MODERATE TO SEVERE PAIN. MAY TAKE 1 ADDITIONAL TABLET DAILY AS NEEDED FOR SEVERE PAIN (Patient taking differently: Take 50 mg by mouth every 8 (eight) hours as needed for moderate pain or severe pain. May take 1 additional tablet daily as needed for severe pain), Disp: 100 tablet, Rfl: 0   Allergies  Allergen Reactions   Morphine And Related Itching  and Rash    Given in IV.    Past Medical History:  Diagnosis Date   Cancer ([place]) 2014   kidney   Esophageal stricture    Fibromyalgia    GERD (gastroesophageal reflux disease)    Helicobacter pylori gastritis    Hypertension    Intention tremor    Lumbago    Memory loss    Osteoarthritis of spine    Pneumonia    hx of years ago    PUD (peptic ulcer disease)    PVD (peripheral vascular disease) ([place])      Past Surgical History:  Procedure Laterality Date   ABDOMINAL HYSTERECTOMY  1985   BREAST ENHANCEMENT SURGERY  1974   CATARACT EXTRACTION Bilateral 2014   shapiro   LAPAROSCOPIC PARTIAL  NEPHRECTOMY Left April '13   [facility], Dr. Alinda Money   LUMBAR FUSION  03/2003   L4-5   TONSILLECTOMY  1958   TUBAL LIGATION  1984    Family History  Problem Relation Age of Onset   Heart disease Mother    COPD Mother    Thyroid disease Mother    Diabetes Father    Heart disease Father        CABG   Stroke Father    Parkinson's disease Father    Heart disease Brother    Stroke Brother    Hyperlipidemia Brother    Diabetes Daughter    Hearing loss Brother    Crohn's disease Other        neice   Hypertension Son    Diabetes Son    Melanoma Son    Colon cancer Neg Hx    Stomach cancer Neg Hx    Esophageal cancer Neg Hx     Social History   Tobacco Use   Smoking status: Never   Smokeless tobacco: Never  Vaping Use   Vaping Use: Never used  Substance Use Topics   Alcohol use: Yes    Alcohol/week: 7.0 standard drinks of alcohol    Types: 7 Glasses of wine per week    Comment: occasional glass of wine with dinner    Drug use: No    ROS   Objective:   Vitals: BP (!) 136/55 (BP Location: Left Arm)   Pulse (!) 58   Temp 98 F (36.7 C) (Oral)   Resp 18   Ht '5\' 2"'$  (1.575 m)   Wt 140 lb (63.5 kg)   SpO2 95%   BMI 25.61 kg/m   Physical Exam Exam conducted with a chaperone present Clinical biochemist).  Constitutional:      General: She is not in acute distress.    Appearance: Normal appearance. She is well-developed. She is not ill-appearing, toxic-appearing or diaphoretic.  HENT:     Head: Normocephalic and atraumatic.     Nose: Nose normal.     Mouth/Throat:     Mouth: Mucous membranes are moist.  Eyes:     General: No scleral icterus.       Right eye: No discharge.        Left eye: No discharge.     Extraocular Movements: Extraocular movements intact.  Cardiovascular:     Rate and Rhythm: Normal rate.  Pulmonary:     Effort: Pulmonary effort is normal.  Genitourinary:    Labia:        Right: No rash, tenderness, lesion or injury.        Left: No rash,  tenderness, lesion or injury.  Vagina: No signs of injury and foreign body. Vaginal discharge (white) and erythema (slight over the labia majora and minora) present. No tenderness, bleeding, lesions or prolapsed vaginal walls.  Skin:    General: Skin is warm and dry.  Neurological:     General: No focal deficit present.     Mental Status: She is alert and oriented to person, place, and time.  Psychiatric:        Mood and Affect: Mood normal.        Behavior: Behavior normal.     Results for orders placed or performed during the hospital encounter of 06/17/22 (from the past 24 hour(s))  POCT urinalysis dipstick     Status: Abnormal   Collection Time: 06/17/22  2:29 PM  Result Value Ref Range   Color, UA yellow yellow   Clarity, UA clear clear   Glucose, UA negative negative mg/dL   Bilirubin, UA negative negative   Ketones, POC UA negative negative mg/dL   Spec Grav, UA <=1.005 (A) 1.010 - 1.025   Blood, UA negative negative   pH, UA 6.0 5.0 - 8.0   Protein Ur, POC negative negative mg/dL   Urobilinogen, UA 0.2 0.2 or 1.0 E.U./dL   Nitrite, UA Negative Negative   Leukocytes, UA Trace (A) Negative    Assessment and Plan :   PDMP not reviewed this encounter.  1. Yeast vaginitis   2. Malodorous urine     I am pursuing an urine culture given the malodorous urine.  Will cover for yeast vaginitis with fluconazole. Counseled patient on potential for adverse effects with medications prescribed/recommended today, ER and return-to-clinic precautions discussed, patient verbalized understanding.    Jaynee Eagles, PA-C 06/17/22 1439

## 2022-06-18 NOTE — Telephone Encounter (Signed)
Pt called in to report she continues to have sleep disturbance while on the aricpet 5 mg per day. She reports she has tried to change the dosage time to see if this would help but no benefit.  I will fwd to MD, she may need to d/c med.

## 2022-06-24 ENCOUNTER — Inpatient Hospital Stay: Payer: Medicare Other | Admitting: Neurology

## 2022-06-27 ENCOUNTER — Telehealth: Payer: Self-pay

## 2022-06-27 ENCOUNTER — Ambulatory Visit: Payer: Medicare Other | Admitting: Internal Medicine

## 2022-06-27 ENCOUNTER — Encounter: Payer: Self-pay | Admitting: Internal Medicine

## 2022-06-27 VITALS — BP 146/62 | HR 79 | Ht 62.0 in | Wt 147.0 lb

## 2022-06-27 DIAGNOSIS — R9431 Abnormal electrocardiogram [ECG] [EKG]: Secondary | ICD-10-CM | POA: Insufficient documentation

## 2022-06-27 DIAGNOSIS — I441 Atrioventricular block, second degree: Secondary | ICD-10-CM

## 2022-06-27 DIAGNOSIS — R42 Dizziness and giddiness: Secondary | ICD-10-CM | POA: Insufficient documentation

## 2022-06-27 DIAGNOSIS — I10 Essential (primary) hypertension: Secondary | ICD-10-CM

## 2022-06-27 MED ORDER — LISINOPRIL 20 MG PO TABS: 20.0000 mg | ORAL_TABLET | Freq: Every day | ORAL | 3 refills | Status: DC

## 2022-06-27 NOTE — Telephone Encounter (Signed)
Patient called asking if she is able to have sexual relations due to she had a monitor placed a few days ago due to her systems. Patient denies having any Systems at the moment. Please advise.

## 2022-06-27 NOTE — Progress Notes (Signed)
Primary Physician/Referring:  Reynold Bowen, MD  Patient ID: [name], female    DOB: 31-Oct-1940, 81 y.o.   MRN: [id number]  Chief Complaint  Patient presents with   Abnormal ECG   New Patient (Initial Visit)        Fatigue   HPI:    [name]  is a 81 y.o. female with past medical history significant for hypertension and fibromyalgia who is here to establish care with cardiology.  Patient's daughter is present for the office visit.  Her daughter has been staying with her lately ever since she had a fall and hit her head.  Patient refused to go to the hospital when this happened however she did agree to go be seen by her primary doctor.  An EKG was obtained at that time which showed second-degree type I AV block in addition to prolonged QT interval and patient was sent to [place].  Additionally she has been having dyspnea on exertion, she lives in a two-story house and she gets very winded when she goes up the stairs now.  Patient is also increasingly fatigued and weak, states that she just has no energy.  Her daughter has been monitoring her heart rate and she has noticed it goes into the 30s and 40s and then into the 100s.  She says she has noticed that the patient almost passing out while eating dinner with her.  Patient states nothing like this is ever happened to her in the past.  Her primary doctor has already stopped her metoprolol.  I advised the daughter to stay with the patient until this testing is completed.  I also advised the patient not to drive considering she is passing out while sitting and eating, it could be very dangerous if this were to occur while she is driving.  Patient denies chest pain, palpitations, diaphoresis, orthopnea, edema, claudication.  Past Medical History:  Diagnosis Date   Cancer ([place]) 2014   kidney   Esophageal stricture    Fibromyalgia    GERD (gastroesophageal reflux disease)    Helicobacter pylori gastritis    Hypertension    Intention  tremor    Lumbago    Memory loss    Osteoarthritis of spine    Pneumonia    hx of years ago    PUD (peptic ulcer disease)    PVD (peripheral vascular disease) ([place])    Past Surgical History:  Procedure Laterality Date   ABDOMINAL HYSTERECTOMY  1985   BREAST ENHANCEMENT SURGERY  1974   CATARACT EXTRACTION Bilateral 2014   shapiro   LAPAROSCOPIC PARTIAL NEPHRECTOMY Left April '13   [facility], Dr. Alinda Money   LUMBAR FUSION  03/2003   L4-5   TONSILLECTOMY  1958   TUBAL LIGATION  1984   Family History  Problem Relation Age of Onset   Heart disease Mother    COPD Mother    Thyroid disease Mother    Diabetes Father    Heart disease Father        CABG   Stroke Father    Parkinson's disease Father    Heart disease Brother    Stroke Brother    Hyperlipidemia Brother    Diabetes Daughter    Hearing loss Brother    Crohn's disease Other        neice   Hypertension Son    Diabetes Son    Melanoma Son    Colon cancer Neg Hx    Stomach cancer Neg Hx  Esophageal cancer Neg Hx     Social History   Tobacco Use   Smoking status: Never   Smokeless tobacco: Never  Substance Use Topics   Alcohol use: Yes    Alcohol/week: 7.0 standard drinks of alcohol    Types: 7 Glasses of wine per week    Comment: occasional glass of wine with dinner    Marital Status: Widowed  ROS  Review of Systems  Constitutional: Positive for malaise/fatigue.  Cardiovascular:  Positive for dyspnea on exertion and near-syncope.  Neurological:  Positive for dizziness, light-headedness and weakness.   Objective  Blood pressure (!) 146/62, pulse 79, height '5\' 2"'$  (1.575 m), weight 147 lb (66.7 kg), SpO2 96 %. Body mass index is 26.89 kg/m.     06/27/2022    9:25 AM 06/17/2022    2:14 PM 05/16/2022    9:25 AM  Vitals with BMI  Height '5\' 2"'$  '5\' 2"'$  '5\' 2"'$   Weight 147 lbs 140 lbs 153 lbs 3 oz  BMI 26.88 22.0 25.42  Systolic 706 237 628  Diastolic 62 55 65  Pulse 79 58 52     Physical Exam Vitals reviewed.   Constitutional:      Appearance: Normal appearance.  HENT:     Head: Normocephalic and atraumatic.  Cardiovascular:     Rate and Rhythm: Normal rate and regular rhythm.     Pulses: Normal pulses.     Heart sounds: Murmur heard.  Pulmonary:     Effort: Pulmonary effort is normal.     Breath sounds: Normal breath sounds.  Abdominal:     General: Bowel sounds are normal.  Musculoskeletal:     Right lower leg: No edema.     Left lower leg: No edema.  Skin:    General: Skin is warm and dry.  Neurological:     Mental Status: She is alert.     Medications and allergies   Allergies  Allergen Reactions   Morphine And Related Itching and Rash    Given in IV.     Medication list after today's encounter   Current Outpatient Medications:    ALPRAZolam (XANAX) 0.5 MG tablet, Take 1 tablet (0.5 mg total) by mouth 2 (two) times daily as needed. for anxiety (Patient taking differently: Take 1 mg by mouth at bedtime.), Disp: 60 tablet, Rfl: 1   amitriptyline (ELAVIL) 25 MG tablet, TAKE 1 TABLET BY MOUTH EVERY DAY AT BEDTIME (Patient taking differently: Take 25 mg by mouth at bedtime.), Disp: 90 tablet, Rfl: 0   Artificial Tear Ointment (DRY EYES OP), Apply 1 drop to eye 3 (three) times daily as needed (for dry eyes)., Disp: , Rfl:    aspirin EC 81 MG tablet, Take 1 tablet (81 mg total) by mouth daily. Swallow whole., Disp: 30 tablet, Rfl: 12   buPROPion (WELLBUTRIN) 75 MG tablet, Take 75 mg by mouth every morning., Disp: , Rfl:    calcium-vitamin D (OSCAL WITH D) 500-200 MG-UNIT per tablet, Take 1 tablet by mouth daily., Disp: , Rfl:    Collagen Hydrolysate, Bovine, POWD, Take 1 Scoop by mouth daily., Disp: , Rfl:    DULoxetine (CYMBALTA) 30 MG capsule, Take 30 mg by mouth See admin instructions. Take 1 capsule (30 mg) combine with 1 capsule (60 mg) by mouth daily, Disp: , Rfl:    levothyroxine (SYNTHROID) 88 MCG tablet, Take 88 mcg by mouth every morning., Disp: , Rfl:    lisinopril  (ZESTRIL) 20 MG tablet, Take 1 tablet (20  mg total) by mouth daily., Disp: 90 tablet, Rfl: 3   methylcellulose (CITRUCEL) oral powder, 2 tablespoons daily, Disp: , Rfl:    Multiple Vitamins-Minerals (MULTIVITAMIN WITH MINERALS) tablet, Take 1 tablet by mouth daily., Disp: , Rfl:    omeprazole (PRILOSEC) 40 MG capsule, Take 1 capsule (40 mg total) by mouth daily. NEED ANNUAL VISIT FOR FURTHER REFILLS (Patient taking differently: Take 40 mg by mouth daily.), Disp: 90 capsule, Rfl: 0   PREMARIN vaginal cream, Place 1 Application vaginally 2 (two) times a week., Disp: , Rfl:    rosuvastatin (CRESTOR) 10 MG tablet, Take 1 tablet (10 mg total) by mouth daily., Disp: 30 tablet, Rfl: 11   Sennosides (SENNA LAX PO), Take 1 tablet by mouth daily., Disp: , Rfl:    traMADol (ULTRAM) 50 MG tablet, TAKE 1 TABLET BY MOUTH EVERY 8 HOURS AS NEEDED FOR MODERATE TO SEVERE PAIN. MAY TAKE 1 ADDITIONAL TABLET DAILY AS NEEDED FOR SEVERE PAIN (Patient taking differently: Take 50 mg by mouth every 8 (eight) hours as needed for moderate pain or severe pain. May take 1 additional tablet daily as needed for severe pain), Disp: 100 tablet, Rfl: 0   donepezil (ARICEPT) 5 MG tablet, TAKE 1 TABLET(5 MG) BY MOUTH AT BEDTIME (Patient not taking: Reported on 06/27/2022), Disp: 90 tablet, Rfl: 1   fluconazole (DIFLUCAN) 150 MG tablet, Take 1 tablet (150 mg total) by mouth every 3 (three) days., Disp: 2 tablet, Rfl: 0   meloxicam (MOBIC) 7.5 MG tablet, Take 7.5 mg by mouth daily. , Disp: , Rfl: 2   metoprolol succinate (TOPROL-XL) 50 MG 24 hr tablet, Take 1 tablet (50 mg total) by mouth daily. Take with or immediately following a meal. NEED ANNUAL VISIT FOR FURTHER REFILLS (Patient not taking: Reported on 06/27/2022), Disp: 90 tablet, Rfl: 0  Laboratory examination:   Lab Results  Component Value Date   NA 136 04/30/2022   K 3.9 04/30/2022   CO2 25 04/30/2022   GLUCOSE 102 (H) 04/30/2022   BUN 21 04/30/2022   CREATININE 0.70  04/30/2022   CALCIUM 9.2 04/30/2022   GFRNONAA >60 04/30/2022       Latest Ref Rng & Units 04/30/2022   12:47 PM 05/17/2020    9:21 AM 05/16/2020    6:03 AM  CMP  Glucose 70 - 99 mg/dL 102  129  106   BUN 8 - 23 mg/dL '21  8  6   '$ Creatinine 0.44 - 1.00 mg/dL 0.70  0.60  0.48   Sodium 135 - 145 mmol/L 136  136  137   Potassium 3.5 - 5.1 mmol/L 3.9  3.8  3.7   Chloride 98 - 111 mmol/L 102  97  101   CO2 22 - 32 mmol/L '25  26  27   '$ Calcium 8.9 - 10.3 mg/dL 9.2  9.2  8.6       Latest Ref Rng & Units 05/01/2022    7:57 AM 04/30/2022   12:47 PM 05/12/2020    3:25 AM  CBC  WBC 4.0 - 10.5 K/uL 4.7  6.1  8.3   Hemoglobin 12.0 - 15.0 g/dL 11.9  13.2  10.5   Hematocrit 36.0 - 46.0 % 35.9  40.2  31.8   Platelets 150 - 400 K/uL 228  266  227     Lipid Panel Recent Labs    05/01/22 0757  CHOL 141  TRIG 46  LDLCALC 80  VLDL 9  HDL 52  CHOLHDL 2.7  HEMOGLOBIN A1C Lab Results  Component Value Date   HGBA1C 5.7 (H) 04/30/2022   MPG 116.89 04/30/2022   TSH Recent Labs    04/30/22 1247  TSH 2.444    External labs:     Radiology:    Cardiac Studies:   No results found for this or any previous visit from the past 1095 days.   EKG:   06/27/2022: NSR with rate variation, normal R wave progression, no evidence of ischemia  06/21/2022: NSR with second degree type I AV block, prolonged QTc  Assessment     ICD-10-CM   1. Nonspecific abnormal electrocardiogram (ECG) (EKG)  R94.31 EKG 12-Lead    2. Essential hypertension  I10 PCV ECHOCARDIOGRAM COMPLETE    3. Dizziness  R42 LONG TERM MONITOR (3-14 DAYS)    PCV MYOCARDIAL PERFUSION WITH LEXISCAN    CANCELED: PCV MYOCARDIAL PERFUSION WO LEXISCAN    4. Atrioventricular block, Mobitz type 1, Wenckebach  I44.1 LONG TERM MONITOR (3-14 DAYS)   on EKG 06/21/2022    5. Prolonged Q-T interval on ECG  R94.31 LONG TERM MONITOR (3-14 DAYS)   EKG 06/21/2022 shows QTc of 481       Orders Placed This Encounter  Procedures    LONG TERM MONITOR (3-14 DAYS)    Standing Status:   Future    Number of Occurrences:   1    Standing Expiration Date:   06/28/2023    Order Specific Question:   Where should this test be performed?    Answer:   [facility]    Order Specific Question:   Does the patient have an implanted cardiac device?    Answer:   No    Order Specific Question:   Prescribed days of wear    Answer:   44    Order Specific Question:   Type of enrollment    Answer:   Clinic Enrollment    Order Specific Question:   Release to patient    Answer:   Immediate   PCV MYOCARDIAL PERFUSION WITH LEXISCAN    Standing Status:   Future    Standing Expiration Date:   08/27/2022   EKG 12-Lead   PCV ECHOCARDIOGRAM COMPLETE    Standing Status:   Future    Standing Expiration Date:   06/28/2023    Meds ordered this encounter  Medications   lisinopril (ZESTRIL) 20 MG tablet    Sig: Take 1 tablet (20 mg total) by mouth daily.    Dispense:  90 tablet    Refill:  3    There are no discontinued medications.   Recommendations:   [name] is a 81 y.o.  female with DOE and pre-syncope    Essential hypertension Stop metoprolol given variability in HR Patient heart rate has been in 30-40s recently Start Lisinopril '20mg'$  at nighttime Encourage low-sodium diet, less than 2000 mg daily.   Dizziness Echocardiogram ordered   Atrioventricular block, Mobitz type 1, Wenckebach Stress test ordered Rule out higher degree heart block   Prolonged Q-T interval on ECG Event monitor ordered   Follow-up in 6 weeks or sooner if needed    Floydene Flock, DO, [facility]  06/27/2022, 12:35 PM Office: [phone] Pager: [phone]

## 2022-06-28 NOTE — Telephone Encounter (Signed)
Yes that's fine 

## 2022-07-05 ENCOUNTER — Emergency Department (HOSPITAL_COMMUNITY): Payer: Medicare Other

## 2022-07-05 ENCOUNTER — Inpatient Hospital Stay (HOSPITAL_COMMUNITY): Payer: Medicare Other

## 2022-07-05 ENCOUNTER — Encounter (HOSPITAL_COMMUNITY)
Admission: EM | Disposition: A | Discharge status: Skilled Nursing Facility | Payer: Self-pay | Source: Home / Self Care | Attending: Internal Medicine

## 2022-07-05 ENCOUNTER — Other Ambulatory Visit: Payer: Self-pay

## 2022-07-05 ENCOUNTER — Inpatient Hospital Stay (HOSPITAL_COMMUNITY): Payer: Medicare Other | Admitting: Anesthesiology

## 2022-07-05 ENCOUNTER — Inpatient Hospital Stay (HOSPITAL_COMMUNITY)
Admission: EM | Admit: 2022-07-05 | Discharge: 2022-07-16 | DRG: 853 | Disposition: A | Discharge status: Skilled Nursing Facility | Payer: Medicare Other | Attending: Internal Medicine | Admitting: Internal Medicine

## 2022-07-05 DIAGNOSIS — Z6827 Body mass index (BMI) 27.0-27.9, adult: Secondary | ICD-10-CM

## 2022-07-05 DIAGNOSIS — R55 Syncope and collapse: Secondary | ICD-10-CM | POA: Diagnosis present

## 2022-07-05 DIAGNOSIS — I110 Hypertensive heart disease with heart failure: Secondary | ICD-10-CM | POA: Diagnosis present

## 2022-07-05 DIAGNOSIS — A419 Sepsis, unspecified organism: Secondary | ICD-10-CM | POA: Diagnosis present

## 2022-07-05 DIAGNOSIS — R1310 Dysphagia, unspecified: Secondary | ICD-10-CM | POA: Diagnosis present

## 2022-07-05 DIAGNOSIS — A09 Infectious gastroenteritis and colitis, unspecified: Secondary | ICD-10-CM | POA: Diagnosis present

## 2022-07-05 DIAGNOSIS — E872 Acidosis, unspecified: Secondary | ICD-10-CM | POA: Diagnosis present

## 2022-07-05 DIAGNOSIS — E43 Unspecified severe protein-calorie malnutrition: Secondary | ICD-10-CM | POA: Insufficient documentation

## 2022-07-05 DIAGNOSIS — I5033 Acute on chronic diastolic (congestive) heart failure: Secondary | ICD-10-CM | POA: Diagnosis not present

## 2022-07-05 DIAGNOSIS — K660 Peritoneal adhesions (postprocedural) (postinfection): Secondary | ICD-10-CM

## 2022-07-05 DIAGNOSIS — F03A3 Unspecified dementia, mild, with mood disturbance: Secondary | ICD-10-CM | POA: Diagnosis present

## 2022-07-05 DIAGNOSIS — I739 Peripheral vascular disease, unspecified: Secondary | ICD-10-CM | POA: Diagnosis present

## 2022-07-05 DIAGNOSIS — K529 Noninfective gastroenteritis and colitis, unspecified: Secondary | ICD-10-CM | POA: Diagnosis not present

## 2022-07-05 DIAGNOSIS — R600 Localized edema: Secondary | ICD-10-CM | POA: Diagnosis not present

## 2022-07-05 DIAGNOSIS — B370 Candidal stomatitis: Secondary | ICD-10-CM | POA: Diagnosis not present

## 2022-07-05 DIAGNOSIS — I480 Paroxysmal atrial fibrillation: Secondary | ICD-10-CM | POA: Diagnosis not present

## 2022-07-05 DIAGNOSIS — K5939 Other megacolon: Secondary | ICD-10-CM | POA: Diagnosis present

## 2022-07-05 DIAGNOSIS — R531 Weakness: Secondary | ICD-10-CM

## 2022-07-05 DIAGNOSIS — Z90710 Acquired absence of both cervix and uterus: Secondary | ICD-10-CM

## 2022-07-05 DIAGNOSIS — M549 Dorsalgia, unspecified: Secondary | ICD-10-CM | POA: Diagnosis present

## 2022-07-05 DIAGNOSIS — D6489 Other specified anemias: Secondary | ICD-10-CM | POA: Diagnosis not present

## 2022-07-05 DIAGNOSIS — G8929 Other chronic pain: Secondary | ICD-10-CM | POA: Diagnosis present

## 2022-07-05 DIAGNOSIS — E871 Hypo-osmolality and hyponatremia: Secondary | ICD-10-CM | POA: Diagnosis not present

## 2022-07-05 DIAGNOSIS — F32A Depression, unspecified: Secondary | ICD-10-CM | POA: Diagnosis present

## 2022-07-05 DIAGNOSIS — E876 Hypokalemia: Secondary | ICD-10-CM | POA: Diagnosis not present

## 2022-07-05 DIAGNOSIS — N179 Acute kidney failure, unspecified: Secondary | ICD-10-CM | POA: Diagnosis not present

## 2022-07-05 DIAGNOSIS — K5909 Other constipation: Secondary | ICD-10-CM | POA: Diagnosis present

## 2022-07-05 DIAGNOSIS — R188 Other ascites: Secondary | ICD-10-CM | POA: Diagnosis present

## 2022-07-05 DIAGNOSIS — Z66 Do not resuscitate: Secondary | ICD-10-CM | POA: Diagnosis present

## 2022-07-05 DIAGNOSIS — E861 Hypovolemia: Secondary | ICD-10-CM | POA: Diagnosis present

## 2022-07-05 DIAGNOSIS — R791 Abnormal coagulation profile: Secondary | ICD-10-CM | POA: Diagnosis not present

## 2022-07-05 DIAGNOSIS — Z820 Family history of epilepsy and other diseases of the nervous system: Secondary | ICD-10-CM

## 2022-07-05 DIAGNOSIS — E785 Hyperlipidemia, unspecified: Secondary | ICD-10-CM | POA: Diagnosis present

## 2022-07-05 DIAGNOSIS — Z905 Acquired absence of kidney: Secondary | ICD-10-CM

## 2022-07-05 DIAGNOSIS — Z791 Long term (current) use of non-steroidal anti-inflammatories (NSAID): Secondary | ICD-10-CM

## 2022-07-05 DIAGNOSIS — R29810 Facial weakness: Secondary | ICD-10-CM | POA: Diagnosis not present

## 2022-07-05 DIAGNOSIS — K219 Gastro-esophageal reflux disease without esophagitis: Secondary | ICD-10-CM | POA: Diagnosis present

## 2022-07-05 DIAGNOSIS — M797 Fibromyalgia: Secondary | ICD-10-CM | POA: Diagnosis present

## 2022-07-05 DIAGNOSIS — Z79899 Other long term (current) drug therapy: Secondary | ICD-10-CM

## 2022-07-05 DIAGNOSIS — Z8673 Personal history of transient ischemic attack (TIA), and cerebral infarction without residual deficits: Secondary | ICD-10-CM

## 2022-07-05 DIAGNOSIS — E440 Moderate protein-calorie malnutrition: Secondary | ICD-10-CM | POA: Diagnosis present

## 2022-07-05 DIAGNOSIS — Z808 Family history of malignant neoplasm of other organs or systems: Secondary | ICD-10-CM

## 2022-07-05 DIAGNOSIS — Z8711 Personal history of peptic ulcer disease: Secondary | ICD-10-CM

## 2022-07-05 DIAGNOSIS — F419 Anxiety disorder, unspecified: Secondary | ICD-10-CM | POA: Diagnosis present

## 2022-07-05 DIAGNOSIS — Z83438 Family history of other disorder of lipoprotein metabolism and other lipidemia: Secondary | ICD-10-CM

## 2022-07-05 DIAGNOSIS — E039 Hypothyroidism, unspecified: Secondary | ICD-10-CM | POA: Diagnosis present

## 2022-07-05 DIAGNOSIS — Z981 Arthrodesis status: Secondary | ICD-10-CM

## 2022-07-05 DIAGNOSIS — I441 Atrioventricular block, second degree: Secondary | ICD-10-CM | POA: Diagnosis present

## 2022-07-05 DIAGNOSIS — R739 Hyperglycemia, unspecified: Secondary | ICD-10-CM | POA: Diagnosis present

## 2022-07-05 DIAGNOSIS — Z833 Family history of diabetes mellitus: Secondary | ICD-10-CM

## 2022-07-05 DIAGNOSIS — Z8249 Family history of ischemic heart disease and other diseases of the circulatory system: Secondary | ICD-10-CM

## 2022-07-05 DIAGNOSIS — Z825 Family history of asthma and other chronic lower respiratory diseases: Secondary | ICD-10-CM

## 2022-07-05 DIAGNOSIS — R339 Retention of urine, unspecified: Secondary | ICD-10-CM | POA: Diagnosis not present

## 2022-07-05 DIAGNOSIS — Z7982 Long term (current) use of aspirin: Secondary | ICD-10-CM

## 2022-07-05 DIAGNOSIS — Z885 Allergy status to narcotic agent status: Secondary | ICD-10-CM

## 2022-07-05 DIAGNOSIS — E162 Hypoglycemia, unspecified: Secondary | ICD-10-CM | POA: Diagnosis not present

## 2022-07-05 DIAGNOSIS — Z79890 Hormone replacement therapy (postmenopausal): Secondary | ICD-10-CM

## 2022-07-05 DIAGNOSIS — R6521 Severe sepsis with septic shock: Secondary | ICD-10-CM | POA: Diagnosis present

## 2022-07-05 DIAGNOSIS — J9601 Acute respiratory failure with hypoxia: Secondary | ICD-10-CM | POA: Diagnosis present

## 2022-07-05 DIAGNOSIS — Z823 Family history of stroke: Secondary | ICD-10-CM

## 2022-07-05 DIAGNOSIS — Z8349 Family history of other endocrine, nutritional and metabolic diseases: Secondary | ICD-10-CM

## 2022-07-05 HISTORY — PX: LAPAROTOMY: SHX154

## 2022-07-05 LAB — COMPREHENSIVE METABOLIC PANEL
ALT: 36 U/L (ref 0–44)
AST: 48 U/L — ABNORMAL HIGH (ref 15–41)
Albumin: 3.9 g/dL (ref 3.5–5.0)
Alkaline Phosphatase: 166 U/L — ABNORMAL HIGH (ref 38–126)
Anion gap: 14 (ref 5–15)
BUN: 20 mg/dL (ref 8–23)
CO2: 21 mmol/L — ABNORMAL LOW (ref 22–32)
Calcium: 9.7 mg/dL (ref 8.9–10.3)
Chloride: 103 mmol/L (ref 98–111)
Creatinine, Ser: 0.96 mg/dL (ref 0.44–1.00)
GFR, Estimated: 59 mL/min — ABNORMAL LOW (ref >60)
Glucose, Bld: 139 mg/dL — ABNORMAL HIGH (ref 70–99)
Potassium: 4.6 mmol/L (ref 3.5–5.1)
Sodium: 138 mmol/L (ref 135–145)
Total Bilirubin: 0.5 mg/dL (ref 0.3–1.2)
Total Protein: 6.9 g/dL (ref 6.5–8.1)

## 2022-07-05 LAB — URINALYSIS, ROUTINE W REFLEX MICROSCOPIC
Bilirubin Urine: NEGATIVE
Glucose, UA: NEGATIVE mg/dL
Hgb urine dipstick: NEGATIVE
Ketones, ur: NEGATIVE mg/dL
Leukocytes,Ua: NEGATIVE
Nitrite: NEGATIVE
Protein, ur: NEGATIVE mg/dL
Specific Gravity, Urine: 1.033 — ABNORMAL HIGH (ref 1.005–1.030)
pH: 6 (ref 5.0–8.0)

## 2022-07-05 LAB — DIFFERENTIAL
Abs Immature Granulocytes: 0 10*3/uL (ref 0.00–0.07)
Basophils Absolute: 0 10*3/uL (ref 0.0–0.1)
Basophils Relative: 0 %
Eosinophils Absolute: 0.1 10*3/uL (ref 0.0–0.5)
Eosinophils Relative: 1 %
Immature Granulocytes: 0 %
Lymphocytes Relative: 63 %
Lymphs Abs: 3.7 10*3/uL (ref 0.7–4.0)
Monocytes Absolute: 0.2 10*3/uL (ref 0.1–1.0)
Monocytes Relative: 3 %
Neutro Abs: 1.9 10*3/uL (ref 1.7–7.7)
Neutrophils Relative %: 33 %

## 2022-07-05 LAB — ETHANOL: Alcohol, Ethyl (B): <10 mg/dL (ref <10)

## 2022-07-05 LAB — I-STAT CHEM 8, ED
BUN: 22 mg/dL (ref 8–23)
Calcium, Ion: 1.17 mmol/L (ref 1.15–1.40)
Chloride: 105 mmol/L (ref 98–111)
Creatinine, Ser: 0.7 mg/dL (ref 0.44–1.00)
Glucose, Bld: 139 mg/dL — ABNORMAL HIGH (ref 70–99)
HCT: 51 % — ABNORMAL HIGH (ref 36.0–46.0)
Hemoglobin: 17.3 g/dL — ABNORMAL HIGH (ref 12.0–15.0)
Potassium: 4.6 mmol/L (ref 3.5–5.1)
Sodium: 137 mmol/L (ref 135–145)
TCO2: 20 mmol/L — ABNORMAL LOW (ref 22–32)

## 2022-07-05 LAB — BASIC METABOLIC PANEL
Anion gap: 11 (ref 5–15)
BUN: 26 mg/dL — ABNORMAL HIGH (ref 8–23)
CO2: 15 mmol/L — ABNORMAL LOW (ref 22–32)
Calcium: 7.5 mg/dL — ABNORMAL LOW (ref 8.9–10.3)
Chloride: 111 mmol/L (ref 98–111)
Creatinine, Ser: 1.02 mg/dL — ABNORMAL HIGH (ref 0.44–1.00)
GFR, Estimated: 55 mL/min — ABNORMAL LOW (ref >60)
Glucose, Bld: 237 mg/dL — ABNORMAL HIGH (ref 70–99)
Potassium: 4.6 mmol/L (ref 3.5–5.1)
Sodium: 137 mmol/L (ref 135–145)

## 2022-07-05 LAB — POCT I-STAT 7, (LYTES, BLD GAS, ICA,H+H)
Acid-base deficit: 11 mmol/L — ABNORMAL HIGH (ref 0.0–2.0)
Acid-base deficit: 14 mmol/L — ABNORMAL HIGH (ref 0.0–2.0)
Bicarbonate: 17.4 mmol/L — ABNORMAL LOW (ref 20.0–28.0)
Bicarbonate: 14.9 mmol/L — ABNORMAL LOW (ref 20.0–28.0)
Calcium, Ion: 1.15 mmol/L (ref 1.15–1.40)
Calcium, Ion: 1.14 mmol/L — ABNORMAL LOW (ref 1.15–1.40)
HCT: 39 % (ref 36.0–46.0)
HCT: 46 % (ref 36.0–46.0)
Hemoglobin: 13.3 g/dL (ref 12.0–15.0)
Hemoglobin: 15.6 g/dL — ABNORMAL HIGH (ref 12.0–15.0)
O2 Saturation: 100 %
O2 Saturation: 92 %
Patient temperature: 97.5
Patient temperature: 37
Potassium: 4.4 mmol/L (ref 3.5–5.1)
Potassium: 4.4 mmol/L (ref 3.5–5.1)
Sodium: 138 mmol/L (ref 135–145)
Sodium: 138 mmol/L (ref 135–145)
TCO2: 19 mmol/L — ABNORMAL LOW (ref 22–32)
TCO2: 16 mmol/L — ABNORMAL LOW (ref 22–32)
pCO2 arterial: 46.5 mmHg (ref 32–48)
pCO2 arterial: 43 mmHg (ref 32–48)
pH, Arterial: 7.179 — CL (ref 7.35–7.45)
pH, Arterial: 7.147 — CL (ref 7.35–7.45)
pO2, Arterial: 259 mmHg — ABNORMAL HIGH (ref 83–108)
pO2, Arterial: 80 mmHg — ABNORMAL LOW (ref 83–108)

## 2022-07-05 LAB — CBC
HCT: 49.8 % — ABNORMAL HIGH (ref 36.0–46.0)
HCT: 47.3 % — ABNORMAL HIGH (ref 36.0–46.0)
Hemoglobin: 16.3 g/dL — ABNORMAL HIGH (ref 12.0–15.0)
Hemoglobin: 15.2 g/dL — ABNORMAL HIGH (ref 12.0–15.0)
MCH: 30.6 pg (ref 26.0–34.0)
MCH: 31.2 pg (ref 26.0–34.0)
MCHC: 32.7 g/dL (ref 30.0–36.0)
MCHC: 32.1 g/dL (ref 30.0–36.0)
MCV: 93.6 fL (ref 80.0–100.0)
MCV: 97.1 fL (ref 80.0–100.0)
Platelets: 300 10*3/uL (ref 150–400)
Platelets: 246 10*3/uL (ref 150–400)
RBC: 5.32 MIL/uL — ABNORMAL HIGH (ref 3.87–5.11)
RBC: 4.87 MIL/uL (ref 3.87–5.11)
RDW: 13.3 % (ref 11.5–15.5)
RDW: 13.6 % (ref 11.5–15.5)
WBC: 5.9 10*3/uL (ref 4.0–10.5)
WBC: 5.8 10*3/uL (ref 4.0–10.5)
nRBC: 0 % (ref 0.0–0.2)
nRBC: 0 % (ref 0.0–0.2)

## 2022-07-05 LAB — CBG MONITORING, ED: Glucose-Capillary: 136 mg/dL — ABNORMAL HIGH (ref 70–99)

## 2022-07-05 LAB — PROTIME-INR
INR: 1 (ref 0.8–1.2)
Prothrombin Time: 13.4 seconds (ref 11.4–15.2)

## 2022-07-05 LAB — APTT: aPTT: 22 seconds — ABNORMAL LOW (ref 24–36)

## 2022-07-05 LAB — TYPE AND SCREEN
ABO/RH(D): O POS
Antibody Screen: NEGATIVE

## 2022-07-05 LAB — RAPID URINE DRUG SCREEN, HOSP PERFORMED
Amphetamines: NOT DETECTED
Barbiturates: NOT DETECTED
Benzodiazepines: POSITIVE — AB
Cocaine: NOT DETECTED
Opiates: NOT DETECTED
Tetrahydrocannabinol: NOT DETECTED

## 2022-07-05 LAB — MRSA NEXT GEN BY PCR, NASAL: MRSA by PCR Next Gen: NOT DETECTED

## 2022-07-05 LAB — LACTIC ACID, PLASMA: Lactic Acid, Venous: 3.7 mmol/L (ref 0.5–1.9)

## 2022-07-05 LAB — MAGNESIUM: Magnesium: 2.5 mg/dL — ABNORMAL HIGH (ref 1.7–2.4)

## 2022-07-05 LAB — GLUCOSE, CAPILLARY
Glucose-Capillary: 143 mg/dL — ABNORMAL HIGH (ref 70–99)
Glucose-Capillary: 191 mg/dL — ABNORMAL HIGH (ref 70–99)

## 2022-07-05 LAB — TSH: TSH: 4.392 u[IU]/mL (ref 0.350–4.500)

## 2022-07-05 SURGERY — LAPAROTOMY, EXPLORATORY
Anesthesia: General | Site: Abdomen

## 2022-07-05 MED ORDER — NOREPINEPHRINE 4 MG/250ML-% IV SOLN
INTRAVENOUS | Status: AC
  Administered 2022-07-05: 2 ug/min via INTRAVENOUS
  Filled 2022-07-05: qty 250

## 2022-07-05 MED ORDER — ACETAMINOPHEN 500 MG PO TABS
1000.0000 mg | ORAL_TABLET | Freq: Once | ORAL | Status: AC
  Administered 2022-07-05: 1000 mg via ORAL
  Filled 2022-07-05: qty 2

## 2022-07-05 MED ORDER — 0.9 % SODIUM CHLORIDE (POUR BTL) OPTIME
TOPICAL | Status: DC | PRN
  Administered 2022-07-05: 1000 mL

## 2022-07-05 MED ORDER — PIPERACILLIN-TAZOBACTAM 3.375 G IVPB 30 MIN
3.3750 g | Freq: Once | INTRAVENOUS | Status: AC
  Administered 2022-07-05: 3.375 g via INTRAVENOUS
  Filled 2022-07-05: qty 50

## 2022-07-05 MED ORDER — LACTATED RINGERS IV SOLN: INTRAVENOUS | Status: DC | PRN

## 2022-07-05 MED ORDER — VASOPRESSIN 20 UNIT/ML IV SOLN
INTRAVENOUS | Status: DC | PRN
  Administered 2022-07-05 (×2): 1 [IU] via INTRAVENOUS

## 2022-07-05 MED ORDER — FENTANYL CITRATE (PF) 100 MCG/2ML IJ SOLN
INTRAMUSCULAR | Status: AC
  Administered 2022-07-05: 100 ug
  Filled 2022-07-05: qty 2

## 2022-07-05 MED ORDER — SODIUM CHLORIDE 0.9 % IV SOLN
2.0000 g | Freq: Once | INTRAVENOUS | Status: AC
  Administered 2022-07-05: 2 g via INTRAVENOUS
  Filled 2022-07-05: qty 20

## 2022-07-05 MED ORDER — PROPOFOL 10 MG/ML IV BOLUS
INTRAVENOUS | Status: DC | PRN
  Administered 2022-07-05: 50 mg via INTRAVENOUS

## 2022-07-05 MED ORDER — SODIUM BICARBONATE 8.4 % IV SOLN
100.0000 meq | Freq: Once | INTRAVENOUS | Status: AC
  Administered 2022-07-05: 100 meq via INTRAVENOUS
  Filled 2022-07-05: qty 100

## 2022-07-05 MED ORDER — HYDROMORPHONE HCL 1 MG/ML IJ SOLN
0.5000 mg | INTRAMUSCULAR | Status: DC | PRN
  Administered 2022-07-05 (×2): 0.5 mg via INTRAVENOUS
  Filled 2022-07-05: qty 1

## 2022-07-05 MED ORDER — FENTANYL CITRATE PF 50 MCG/ML IJ SOSY: 50.0000 ug | PREFILLED_SYRINGE | Freq: Once | INTRAMUSCULAR | Status: DC

## 2022-07-05 MED ORDER — KETAMINE HCL 10 MG/ML IJ SOLN
INTRAMUSCULAR | Status: DC | PRN
  Administered 2022-07-05: 20 mg via INTRAVENOUS

## 2022-07-05 MED ORDER — PROPOFOL 10 MG/ML IV BOLUS
INTRAVENOUS | Status: AC
  Filled 2022-07-05: qty 20

## 2022-07-05 MED ORDER — EPINEPHRINE HCL 5 MG/250ML IV SOLN IN NS
0.5000 ug/min | INTRAVENOUS | Status: DC
  Filled 2022-07-05: qty 250

## 2022-07-05 MED ORDER — BISACODYL 10 MG RE SUPP
10.0000 mg | Freq: Every day | RECTAL | Status: DC | PRN
  Administered 2022-07-05: 10 mg via RECTAL
  Filled 2022-07-05: qty 1

## 2022-07-05 MED ORDER — HEPARIN SODIUM (PORCINE) 5000 UNIT/ML IJ SOLN
5000.0000 [IU] | Freq: Three times a day (TID) | INTRAMUSCULAR | Status: DC
  Administered 2022-07-05 – 2022-07-15 (×29): 5000 [IU] via SUBCUTANEOUS
  Filled 2022-07-05 (×29): qty 1

## 2022-07-05 MED ORDER — LEVOTHYROXINE SODIUM 88 MCG PO TABS
88.0000 ug | ORAL_TABLET | Freq: Every day | ORAL | Status: DC
  Filled 2022-07-05: qty 1

## 2022-07-05 MED ORDER — SODIUM CHLORIDE 0.9 % IV BOLUS
1000.0000 mL | Freq: Once | INTRAVENOUS | Status: AC
  Administered 2022-07-05: 1000 mL via INTRAVENOUS

## 2022-07-05 MED ORDER — IOHEXOL 350 MG/ML SOLN
75.0000 mL | Freq: Once | INTRAVENOUS | Status: AC | PRN
  Administered 2022-07-05: 75 mL via INTRAVENOUS

## 2022-07-05 MED ORDER — NOREPINEPHRINE 4 MG/250ML-% IV SOLN
0.0000 ug/min | INTRAVENOUS | Status: DC
  Administered 2022-07-06: 20 ug/min via INTRAVENOUS
  Administered 2022-07-06: 23 ug/min via INTRAVENOUS
  Administered 2022-07-06: 20 ug/min via INTRAVENOUS
  Administered 2022-07-06: 15 ug/min via INTRAVENOUS
  Filled 2022-07-05 (×4): qty 250

## 2022-07-05 MED ORDER — SODIUM CHLORIDE 0.9 % IV SOLN
250.0000 mL | INTRAVENOUS | Status: DC
  Administered 2022-07-11: 250 mL via INTRAVENOUS

## 2022-07-05 MED ORDER — ONDANSETRON HCL 4 MG/2ML IJ SOLN
4.0000 mg | Freq: Once | INTRAMUSCULAR | Status: AC
  Administered 2022-07-05: 4 mg via INTRAVENOUS
  Filled 2022-07-05: qty 2

## 2022-07-05 MED ORDER — LACTATED RINGERS IV SOLN: INTRAVENOUS | Status: DC

## 2022-07-05 MED ORDER — LACTATED RINGERS IV BOLUS
1000.0000 mL | Freq: Once | INTRAVENOUS | Status: AC
  Administered 2022-07-05: 1000 mL via INTRAVENOUS

## 2022-07-05 MED ORDER — INSULIN ASPART 100 UNIT/ML IJ SOLN
2.0000 [IU] | INTRAMUSCULAR | Status: DC
  Administered 2022-07-05: 4 [IU] via SUBCUTANEOUS

## 2022-07-05 MED ORDER — ORAL CARE MOUTH RINSE
15.0000 mL | OROMUCOSAL | Status: DC
  Administered 2022-07-05 – 2022-07-09 (×44): 15 mL via OROMUCOSAL

## 2022-07-05 MED ORDER — PROPOFOL 1000 MG/100ML IV EMUL
5.0000 ug/kg/min | INTRAVENOUS | Status: DC
  Administered 2022-07-05: 40 ug/kg/min via INTRAVENOUS
  Administered 2022-07-06: 30 ug/kg/min via INTRAVENOUS
  Filled 2022-07-05 (×3): qty 100

## 2022-07-05 MED ORDER — ASPIRIN 81 MG PO CHEW
81.0000 mg | CHEWABLE_TABLET | Freq: Every day | ORAL | Status: DC
  Administered 2022-07-05: 81 mg via ORAL
  Filled 2022-07-05: qty 1

## 2022-07-05 MED ORDER — NOREPINEPHRINE 4 MG/250ML-% IV SOLN: 0.0000 ug/min | INTRAVENOUS | Status: DC

## 2022-07-05 MED ORDER — METRONIDAZOLE 500 MG/100ML IV SOLN
500.0000 mg | Freq: Two times a day (BID) | INTRAVENOUS | Status: DC
  Administered 2022-07-05 – 2022-07-11 (×12): 500 mg via INTRAVENOUS
  Filled 2022-07-05 (×12): qty 100

## 2022-07-05 MED ORDER — ROCURONIUM BROMIDE 10 MG/ML (PF) SYRINGE
PREFILLED_SYRINGE | INTRAVENOUS | Status: DC | PRN
  Administered 2022-07-05: 100 mg via INTRAVENOUS

## 2022-07-05 MED ORDER — PROPOFOL 500 MG/50ML IV EMUL
INTRAVENOUS | Status: DC | PRN
  Administered 2022-07-05: 50 ug/kg/min via INTRAVENOUS

## 2022-07-05 MED ORDER — DEXAMETHASONE SODIUM PHOSPHATE 10 MG/ML IJ SOLN
INTRAMUSCULAR | Status: DC | PRN
  Administered 2022-07-05: 10 mg via INTRAVENOUS

## 2022-07-05 MED ORDER — KETAMINE HCL 50 MG/5ML IJ SOSY
PREFILLED_SYRINGE | INTRAMUSCULAR | Status: AC
  Filled 2022-07-05: qty 5

## 2022-07-05 MED ORDER — SODIUM CHLORIDE 0.9 % IV SOLN: INTRAVENOUS | Status: DC | PRN

## 2022-07-05 MED ORDER — POLYETHYLENE GLYCOL 3350 17 G PO PACK: 17.0000 g | PACK | Freq: Every day | ORAL | Status: DC | PRN

## 2022-07-05 MED ORDER — ORAL CARE MOUTH RINSE: 15.0000 mL | OROMUCOSAL | Status: DC | PRN

## 2022-07-05 MED ORDER — CHLORHEXIDINE GLUCONATE CLOTH 2 % EX PADS
6.0000 | MEDICATED_PAD | Freq: Every day | CUTANEOUS | Status: DC
  Administered 2022-07-05 – 2022-07-16 (×11): 6 via TOPICAL

## 2022-07-05 MED ORDER — NOREPINEPHRINE 4 MG/250ML-% IV SOLN
2.0000 ug/min | INTRAVENOUS | Status: DC
  Administered 2022-07-05: 6 ug/min via INTRAVENOUS
  Filled 2022-07-05: qty 250

## 2022-07-05 MED ORDER — DOCUSATE SODIUM 100 MG PO CAPS: 100.0000 mg | ORAL_CAPSULE | Freq: Two times a day (BID) | ORAL | Status: DC | PRN

## 2022-07-05 MED ORDER — ONDANSETRON HCL 4 MG/2ML IJ SOLN
4.0000 mg | Freq: Once | INTRAMUSCULAR | Status: AC
  Administered 2022-07-05: 4 mg via INTRAVENOUS

## 2022-07-05 MED ORDER — ONDANSETRON HCL 4 MG/2ML IJ SOLN
INTRAMUSCULAR | Status: DC | PRN
  Administered 2022-07-05: 4 mg via INTRAVENOUS

## 2022-07-05 MED ORDER — SODIUM BICARBONATE 8.4 % IV SOLN
INTRAVENOUS | Status: DC
  Filled 2022-07-05: qty 1000
  Filled 2022-07-05: qty 150

## 2022-07-05 MED ORDER — VASOPRESSIN 20 UNIT/ML IV SOLN
INTRAVENOUS | Status: AC
  Filled 2022-07-05: qty 1

## 2022-07-05 MED ORDER — HYDROMORPHONE HCL 1 MG/ML IJ SOLN: 0.5000 mg | Freq: Once | INTRAMUSCULAR | Status: DC

## 2022-07-05 MED ORDER — SUCCINYLCHOLINE CHLORIDE 200 MG/10ML IV SOSY
PREFILLED_SYRINGE | INTRAVENOUS | Status: DC | PRN
  Administered 2022-07-05: 120 mg via INTRAVENOUS

## 2022-07-05 SURGICAL SUPPLY — 36 items
APL PRP STRL LF DISP 70% ISPRP (MISCELLANEOUS) ×1
BAG COUNTER SPONGE SURGICOUNT (BAG) ×1 IMPLANT
BAG SPNG CNTER NS LX DISP (BAG) ×1
CANISTER SUCT 3000ML PPV (MISCELLANEOUS) ×1 IMPLANT
CHLORAPREP W/TINT 26 (MISCELLANEOUS) ×1 IMPLANT
COVER SURGICAL LIGHT HANDLE (MISCELLANEOUS) ×1 IMPLANT
DRAPE LAPAROSCOPIC ABDOMINAL (DRAPES) ×1 IMPLANT
DRAPE WARM FLUID 44X44 (DRAPES) ×1 IMPLANT
DRSG OPSITE POSTOP 4X10 (GAUZE/BANDAGES/DRESSINGS) IMPLANT
ELECT BLADE 6.5 EXT (BLADE) IMPLANT
ELECT REM PT RETURN 9FT ADLT (ELECTROSURGICAL) ×1
ELECTRODE REM PT RTRN 9FT ADLT (ELECTROSURGICAL) ×1 IMPLANT
GLOVE BIO SURGEON STRL SZ7.5 (GLOVE) ×1 IMPLANT
GLOVE INDICATOR 8.0 STRL GRN (GLOVE) ×1 IMPLANT
GOWN STRL REUS W/ TWL LRG LVL3 (GOWN DISPOSABLE) ×1 IMPLANT
GOWN STRL REUS W/ TWL XL LVL3 (GOWN DISPOSABLE) ×1 IMPLANT
GOWN STRL REUS W/TWL LRG LVL3 (GOWN DISPOSABLE) ×1
GOWN STRL REUS W/TWL XL LVL3 (GOWN DISPOSABLE) ×1
HANDLE SUCTION POOLE (INSTRUMENTS) ×1 IMPLANT
KIT BASIN OR (CUSTOM PROCEDURE TRAY) ×1 IMPLANT
KIT TURNOVER KIT B (KITS) ×1 IMPLANT
LIGASURE IMPACT 36 18CM CVD LR (INSTRUMENTS) IMPLANT
NS IRRIG 1000ML POUR BTL (IV SOLUTION) ×2 IMPLANT
PACK GENERAL/GYN (CUSTOM PROCEDURE TRAY) ×1 IMPLANT
PAD ARMBOARD 7.5X6 YLW CONV (MISCELLANEOUS) ×1 IMPLANT
SPECIMEN JAR LARGE (MISCELLANEOUS) IMPLANT
STAPLER VISISTAT 35W (STAPLE) ×1 IMPLANT
SUCTION POOLE HANDLE (INSTRUMENTS) ×1
SUT PDS AB 1 TP1 96 (SUTURE) IMPLANT
SUT SILK 2 0 SH CR/8 (SUTURE) ×1 IMPLANT
SUT SILK 2 0 TIES 10X30 (SUTURE) ×1 IMPLANT
SUT SILK 3 0 SH CR/8 (SUTURE) ×1 IMPLANT
SUT SILK 3 0 TIES 10X30 (SUTURE) ×1 IMPLANT
TOWEL GREEN STERILE (TOWEL DISPOSABLE) ×1 IMPLANT
TRAY FOLEY MTR SLVR 16FR STAT (SET/KITS/TRAYS/PACK) ×1 IMPLANT
YANKAUER SUCT BULB TIP NO VENT (SUCTIONS) IMPLANT

## 2022-07-05 NOTE — Anesthesia Preprocedure Evaluation (Addendum)
Anesthesia Evaluation  Patient identified by MRN, date of birth, ID band Patient awake    Reviewed: Allergy & Precautions, NPO status , Patient's Chart, lab work & pertinent test resultsPreop documentation limited or incomplete due to emergent nature of procedure.  History of Anesthesia Complications Negative for: history of anesthetic complications  Airway Mallampati: III  TM Distance: >3 FB Neck ROM: Full    Dental  (+) Dental Advisory Given, Upper Dentures   Pulmonary    Pulmonary exam normal        Cardiovascular hypertension, Pt. on medications and Pt. on home beta blockers + Peripheral Vascular Disease   Rhythm:Regular Rate:Tachycardia     Neuro/Psych  PSYCHIATRIC DISORDERS Anxiety Depression     Memory loss  TIA   GI/Hepatic PUD,GERD  Medicated,, Bowel obstruction    Endo/Other  Hypothyroidism    Renal/GU      Musculoskeletal  (+) Arthritis ,  Fibromyalgia -  Abdominal   Peds  Hematology   Anesthesia Other Findings   Reproductive/Obstetrics                             Anesthesia Physical Anesthesia Plan  ASA: 4 and emergent  Anesthesia Plan: General   Post-op Pain Management: Tylenol PO (pre-op)*   Induction: Intravenous, Rapid sequence and Cricoid pressure planned  PONV Risk Score and Plan: 3 and Treatment may vary due to age or medical condition  Airway Management Planned: Oral ETT  Additional Equipment: Arterial line and CVP  Intra-op Plan:   Post-operative Plan: Post-operative intubation/ventilation  Informed Consent:      Only emergency history available  Plan Discussed with: CRNA, Anesthesiologist and Surgeon  Anesthesia Plan Comments:        Anesthesia Quick Evaluation

## 2022-07-05 NOTE — Progress Notes (Signed)
eLink Physician-Brief Progress Note Patient Name: Medrith Veillon DOB: [birth year] MRN: [id number]   Date of Service  07/05/2022  HPI/Events of Note  Patient admitted with syncope, shock, and concern for intestinal obstruction for which she underwent exploratory laparotomy earlier this evening.  eICU Interventions  New Patient Evaluation.        Kerry Kass Kawehi Hostetter 07/05/2022, 7:53 PM

## 2022-07-05 NOTE — ED Notes (Signed)
Verbal order from CC PA to increase Levo gtt to 15 mcg/min.

## 2022-07-05 NOTE — ED Notes (Signed)
Dr. Chase Caller aware of BP 76/62, maxed out on levo gtt.

## 2022-07-05 NOTE — Progress Notes (Signed)
eLink Physician-Brief Progress Note Patient Name: [name] DOB: [birth year] MRN: [id number]   Date of Service  07/05/2022  HPI/Events of Note  Patient with metabolic acidosis (PH 3.15), she is on a Bicarb  gtt.  eICU Interventions  Bicarb 100 meq iv push x 1, respiratory rate on the ventilator increased from 18 to 20 to optimize respiratory compensation as well.        Kerry Kass Antwoine Zorn 07/05/2022, 10:38 PM

## 2022-07-05 NOTE — Op Note (Signed)
07/05/2022  7:19 PM  PATIENT:  [name]  80 y.o. female  Patient Care Team: Reynold Bowen, MD as PCP - General (Endocrinology) Raynelle Bring, MD as Consulting Physician (Urology) Irene Shipper, MD as Consulting Physician (Gastroenterology) Rutherford Guys, MD as Consulting Physician (Ophthalmology) Newman Pies, MD (Neurosurgery)  PRE-OPERATIVE DIAGNOSIS:  Septic shock, acute abdomen, colitis  POST-OPERATIVE DIAGNOSIS:  Same  PROCEDURE:  Exploratory laparotomy  SURGEON:  Sharon Mt. Esiquio Boesen, MD  ASSISTANT: OR staff  ANESTHESIA:   general  COUNTS:  Sponge, needle and instrument counts were reported correct x2 at the conclusion of the operation.  EBL: 20 mL  DRAINS: None   SPECIMEN: None   COMPLICATIONS: None  FINDINGS: Omental adhesion in the pelvis partially kinking the sigmoid colon but not obstructing it.  Severe constipation.  Red striations about her sigmoid which is otherwise completely normal in appearance and well-perfused.  No stercoral perforation of her rectum or sigmoid colon.  Dilated but otherwise normal-appearing cecum and appendix.  No evidence of ischemia.  Normal small bowel.  Normal stomach.  Normal duodenal bulb.  Normal gallbladder.  Soft pancreas.  DISPOSITION: PACU in satisfactory condition  DESCRIPTION: The patient was identified and emergently transported to the OR where she was placed on the operating room table. SCDs were placed. General endotracheal anesthesia was induced without difficulty. A foley catheter was placed. She was then prepped and draped in the usual sterile fashion. A surgical timeout was performed indicating the correct patient, procedure, positioning and need for preoperative antibiotics.   A midline laparotomy was created.  Subcutaneous tissue divided electrocautery.  The fascia was incised midline.  The peritoneum was cautiously entered bluntly.  The abdomen is entered and ascites is immediately evacuated, totaling  approximately 250 cc.  The abdomen is evaluated.  We began by evaluating the rectosigmoid colon.  In the pelvis, there is an omental containing adhesion going down to the pelvis with a loop of sigmoid partially kinked by this underneath the band.  This was released.  The sigmoid was then fully evaluated all the way down to the intraperitoneal portion of the lower rectum.  This is all relatively normal appearance.  There are some red striations extending along the sigmoidal wall and the antimesenteric border.  The sigmoid is pink and well-perfused in appearance.  There is no evidence stercoral perforation.  The ascending and transverse colon are evaluated and although somewhat dilated clearly healthy in appearance.  The hepatic flexure is palpated and normal.  There are no palpable masses within her colon although evaluation is somewhat limited by the degree of constipation which she has including large amounts of formed stool throughout much of her colon.  The cecum although dilated is also normal in appearance.  There is no evident perforation of her cecum.  The appendix is normal in appearance.  We then ran the small bowel.  Small bowel is normal in appearance.  The stomach is then evaluated.  Soft and normal in appearance.  The omentum is identified and partially open to facilitate exploration of the lesser sac.  The posterior wall of the stomach is normal in appearance.  The lesser sac is also clean without any ascites.  The duodenum is soft and normal in appearance.  The pancreas is soft.  The liver and gallbladder are normal in appearance.  Spleen is palpated and grossly normal.  Given all these findings, the abdomen is irrigated.  All sponge, needle, and instrument counts were reported correct.  Attention  is directed to closing the abdomen.  Running #1 PDS loop sutures were then used to close the fascia.  The fascia is then palpated noted to be completely closed.  The skin is then approximated using skin  staples.  The abdomen is then washed and dried.  A dressing consisting of a honeycomb wound dressing is placed.  She was then taken off to the operating room table and placed onto a stretcher for transport to the intensive care unit intubated and sedated in critical but stable condition.  Findings of the operation related to critical care medicine and to her family.

## 2022-07-05 NOTE — ED Notes (Signed)
ICU doctor at bedside assessing pt. BP 81/27. Verbal order to increase levo gtt to 10 mcg/min by Dr. Chase Caller

## 2022-07-05 NOTE — Anesthesia Postprocedure Evaluation (Signed)
Anesthesia Post Note  Patient: [name]  Procedure(s) Performed: EXPLORATORY LAPAROTOMY (Abdomen)     Patient location during evaluation: ICU Anesthesia Type: General Level of consciousness: sedated Pain management: pain level controlled Respiratory status: patient remains intubated per anesthesia plan Cardiovascular status: blood pressure returned to baseline Postop Assessment: no apparent nausea or vomiting Anesthetic complications: no   No notable events documented.  Last Vitals:  Vitals:   07/05/22 2000 07/05/22 2100  BP: (!) 74/37 (!) 85/53  Pulse: 91   Resp: 18 18  Temp:    SpO2: 99%     Last Pain:  Vitals:   07/05/22 1933  TempSrc: Axillary  PainSc:                  Karyl Kinnier Tomeka Kantner

## 2022-07-05 NOTE — Progress Notes (Signed)
eLink Physician-Brief Progress Note Patient Name: [name] DOB: [birth year] MRN: [id number]   Date of Service  07/05/2022  HPI/Events of Note  Hyperglycemia.  eICU Interventions  CBG + SSI ordered.        Kerry Kass Braddock Servellon 07/05/2022, 11:34 PM

## 2022-07-05 NOTE — ED Notes (Signed)
Pt transported to OR via stretcher with RN X 2 and on ED transport monitor.

## 2022-07-05 NOTE — Code Documentation (Signed)
Stroke Response Nurse Documentation Code Documentation  Noah Pelaez is a 81 y.o. female arriving to [facility]  via [facility] on 07/05/2022 with past medical hx of HTN. On No antithrombotic. Code stroke was activated by EMS.   Patient from home where she was LKW at 0930 and now complaining of left sided weakness and vomiting. Pt took stool softeners last night. Upon waking this morning she was having severe abdominal cramping. She went to the bathroom to attempt to have a bowel movement and her daughter found her leaning to her left side on the commode.   Stroke team at the bedside on patient arrival. Labs drawn and patient cleared for CT by Dr. Truett Mainland. Patient to CT with team. Pt found to be hypotensive in CT, BP 77/36 HR 114. VS with EMS were BP 111/79, HR 127.   NIHSS 2, see documentation for details and code stroke times. Patient with bilateral leg weakness on exam.   The following imaging was completed:  CT Head and CTA.   Patient is not a candidate for IV Thrombolytic due to symptoms improving. Patient is not a candidate for IR due to assessment negative for LVO.   Care Plan: q2h NIHSS and VS. Symptoms improving with improving BP.   Bedside handoff with ED RN [place].    Shayann Garbutt L Netasha Wehrli  Rapid Response RN

## 2022-07-05 NOTE — ED Triage Notes (Signed)
Pt BIB [facility] from home C/O L sided weakness, slurred speech, blurred vision since 0930.

## 2022-07-05 NOTE — ED Provider Notes (Signed)
[facility] EMERGENCY DEPARTMENT Provider Note  CSN: [id number] Arrival date & time: 07/05/22 1048  Chief Complaint(s) Weakness  HPI [name] is a 81 y.o. female with history of hypertension, hyperlipidemia presenting to the emergency department with reported left-sided weakness as a code stroke.  Per daughter around 67, patient was straining to go to the bathroom and then seemed to have a syncopal event leading to 1 side.  She called EMS.  She reports that she was lethargic until EMS arrived and then vomited when EMS arrived.  EMS felt patient had left-sided deficits and activated a code stroke.  Patient seen by neurology on arrival.  Patient currently denies any numbness or tingling, weakness.  She reports her primary complaint is abdominal pain.  She reports lower abdominal pain, difficulty with urination.  She also reports constipation over the past few days, has tried laxatives without relief.  She is straining to go to the bathroom when she had her fainting episode.  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   Dizziness 06/27/2022   Prolonged Q-T interval on ECG 06/27/2022   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   TIA (transient ischemic attack) 04/30/2022   Acute respiratory failure with hypoxia ([place]) 05/11/2020   Right upper lobe pneumonia 05/11/2020   Hyponatremia 05/11/2020   GERD (gastroesophageal reflux disease)    Anxiety with  depression    Anxiety 11/14/2016   Swelling 06/18/2016   Hypothyroidism 05/17/2016   Abnormal CT scan 12/28/2015   Chronic low back pain 04/13/2015   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 Authorizing Provider  ALPRAZolam Duanne Moron) 0.5 MG tablet Take 1 tablet (0.5 mg total) by mouth 2 (two) times daily as needed. for anxiety Patient taking differently: Take 1 mg by mouth at bedtime. 11/14/16   Plotnikov, Evie Lacks, MD  amitriptyline (ELAVIL) 25 MG tablet TAKE 1 TABLET BY MOUTH EVERY DAY AT BEDTIME Patient taking differently: Take 25 mg by mouth at bedtime. 10/23/17   Hoyt Koch, MD  Artificial Tear Ointment (DRY EYES OP) Apply 1 drop to eye 3 (three) times daily as needed (for dry eyes).    [provider]  aspirin EC 81 MG tablet Take 1 tablet (81 mg total) by mouth daily. Swallow whole. 05/02/22   Regalado, Belkys A, MD  buPROPion (WELLBUTRIN) 75 MG tablet Take 75 mg by mouth every morning. 02/28/20   [provider]  calcium-vitamin D (OSCAL WITH D) 500-200 MG-UNIT per tablet Take 1 tablet by mouth daily.    [provider]  Collagen  Hydrolysate, Bovine, POWD Take 1 Scoop by mouth daily.    [provider]  donepezil (ARICEPT) 5 MG tablet TAKE 1 TABLET(5 MG) BY MOUTH AT BEDTIME Patient not taking: Reported on 06/27/2022 06/06/22   Garvin Fila, MD  DULoxetine (CYMBALTA) 30 MG capsule Take 30 mg by mouth See admin instructions. Take 1 capsule (30 mg) combine with 1 capsule (60 mg) by mouth daily    [provider]  fluconazole (DIFLUCAN) 150 MG tablet Take 1 tablet (150 mg total) by mouth every 3 (three)  days. 06/17/22   Jaynee Eagles, PA-C  levothyroxine (SYNTHROID) 88 MCG tablet Take 88 mcg by mouth every morning. 04/01/22   [provider]  lisinopril (ZESTRIL) 20 MG tablet Take 1 tablet (20 mg total) by mouth daily. 06/27/22 09/25/22  Custovic, Collene Mares, DO  meloxicam (MOBIC) 7.5 MG tablet Take 7.5 mg by mouth daily.  03/25/18   [provider]  methylcellulose (CITRUCEL) oral powder 2 tablespoons daily 03/08/21   Irene Shipper, MD  metoprolol succinate (TOPROL-XL) 50 MG 24 hr tablet Take 1 tablet (50 mg total) by mouth daily. Take with or immediately following a meal. NEED ANNUAL VISIT FOR FURTHER REFILLS Patient not taking: Reported on 06/27/2022 04/29/17   Hoyt Koch, MD  Multiple Vitamins-Minerals (MULTIVITAMIN WITH MINERALS) tablet Take 1 tablet by mouth daily.    [provider]  omeprazole (PRILOSEC) 40 MG capsule Take 1 capsule (40 mg total) by mouth daily. NEED ANNUAL VISIT FOR FURTHER REFILLS Patient taking differently: Take 40 mg by mouth daily. 04/29/17   Hoyt Koch, MD  PREMARIN vaginal cream Place 1 Application vaginally 2 (two) times a week. 02/11/22   [provider]  rosuvastatin (CRESTOR) 10 MG tablet Take 1 tablet (10 mg total) by mouth daily. 05/01/22 05/01/23  Regalado, Belkys A, MD  Sennosides (SENNA LAX PO) Take 1 tablet by mouth daily.    [provider]  traMADol (ULTRAM) 50 MG tablet TAKE 1 TABLET BY MOUTH EVERY 8 HOURS AS NEEDED FOR MODERATE TO SEVERE PAIN. MAY TAKE 1 ADDITIONAL TABLET DAILY AS NEEDED FOR SEVERE PAIN Patient taking differently: Take 50 mg by mouth every 8 (eight) hours as needed for moderate pain or severe pain. May take 1 additional tablet daily as needed for severe pain 10/24/16   Hoyt Koch, MD                                                                                                                                    Past Surgical History Past Surgical History:  Procedure  Laterality Date   [place]   CATARACT EXTRACTION Bilateral 2014   shapiro   LAPAROSCOPIC PARTIAL NEPHRECTOMY Left April '13   [facility], Dr. Alinda Money   LUMBAR FUSION  03/2003   L4-5   [place]  Family History Family History  Problem Relation Age of Onset   Heart disease Mother    COPD Mother    Thyroid disease Mother    Diabetes Father    Heart disease Father        CABG   Stroke Father    Parkinson's disease Father    Heart disease Brother    Stroke Brother    Hyperlipidemia Brother    Diabetes Daughter    Hearing loss Brother    Crohn's disease Other        neice   Hypertension Son    Diabetes Son    Melanoma Son    Colon cancer Neg Hx    Stomach cancer Neg Hx    Esophageal cancer Neg Hx     Social History Social History   Tobacco Use   Smoking status: Never   Smokeless tobacco: Never  Vaping Use   Vaping Use: Never used  Substance Use Topics   Alcohol use: Yes    Alcohol/week: 7.0 standard drinks of alcohol    Types: 7 Glasses of wine per week    Comment: occasional glass of wine with dinner    Drug use: No   Allergies Morphine and related  Review of Systems Review of Systems  All other systems reviewed and are negative.   Physical Exam Vital Signs  I have reviewed the triage vital signs BP (!) 126/56   Pulse 75   Temp 97.8 F (36.6 C) (Oral)   Resp (!) 23   Wt 68.8 kg   SpO2 99%   BMI 27.74 kg/m  Physical Exam Vitals and nursing note reviewed.  Constitutional:      General: She is not in acute distress.    Appearance: She is well-developed.  HENT:     Head: Normocephalic and atraumatic.     Mouth/Throat:     Mouth: Mucous membranes are moist.  Eyes:     Pupils: Pupils are equal, round, and reactive to light.  Cardiovascular:     Rate and Rhythm: Normal rate and regular rhythm.     Heart sounds: No murmur heard. Pulmonary:     Effort: Pulmonary  effort is normal. No respiratory distress.     Breath sounds: Normal breath sounds.  Abdominal:     General: Abdomen is flat. There is distension (mild lower abdominal).     Palpations: Abdomen is soft.     Tenderness: There is no abdominal tenderness.  Musculoskeletal:        General: No tenderness.     Right lower leg: No edema.     Left lower leg: No edema.  Skin:    General: Skin is warm and dry.  Neurological:     General: No focal deficit present.     Mental Status: She is alert. Mental status is at baseline.     Comments: Cranial nerves II through XII intact, strength 5 out of 5 in the bilateral upper and lower extremities, no sensory deficit to light touch  Psychiatric:        Mood and Affect: Mood normal.        Behavior: Behavior normal.     ED Results and Treatments Labs (all labs ordered are listed, but only abnormal results are displayed) Labs Reviewed  APTT - Abnormal; Notable for the following components:      Result Value   aPTT 22 (*)    All other components within normal limits  CBC - Abnormal; Notable for  the following components:   RBC 5.32 (*)    Hemoglobin 16.3 (*)    HCT 49.8 (*)    All other components within normal limits  COMPREHENSIVE METABOLIC PANEL - Abnormal; Notable for the following components:   CO2 21 (*)    Glucose, Bld 139 (*)    AST 48 (*)    Alkaline Phosphatase 166 (*)    GFR, Estimated 59 (*)    All other components within normal limits  I-STAT CHEM 8, ED - Abnormal; Notable for the following components:   Glucose, Bld 139 (*)    TCO2 20 (*)    Hemoglobin 17.3 (*)    HCT 51.0 (*)    All other components within normal limits  CBG MONITORING, ED - Abnormal; Notable for the following components:   Glucose-Capillary 136 (*)    All other components within normal limits  URINE CULTURE  CULTURE, BLOOD (ROUTINE X 2)  CULTURE, BLOOD (ROUTINE X 2)  ETHANOL  PROTIME-INR  DIFFERENTIAL  RAPID URINE DRUG SCREEN, HOSP PERFORMED   URINALYSIS, ROUTINE W REFLEX MICROSCOPIC  TSH  LACTIC ACID, PLASMA  LACTIC ACID, PLASMA  TYPE AND SCREEN                                                                                                                          Radiology CT Angio Chest/Abd/Pel for Dissection W and/or Wo Contrast  Result Date: 07/05/2022 CLINICAL DATA:  Acute aortic syndrome. EXAM: CT ANGIOGRAPHY CHEST, ABDOMEN AND PELVIS TECHNIQUE: Non-contrast CT of the chest was initially obtained. Multidetector CT imaging through the chest, abdomen and pelvis was performed using the standard protocol during bolus administration of intravenous contrast. Multiplanar reconstructed images and MIPs were obtained and reviewed to evaluate the vascular anatomy. RADIATION DOSE REDUCTION: This exam was performed according to the departmental dose-optimization program which includes automated exposure control, adjustment of the mA and/or kV according to patient size and/or use of iterative reconstruction technique. CONTRAST:  31m OMNIPAQUE IOHEXOL 350 MG/ML SOLN COMPARISON:  May 16, 2020. FINDINGS: CTA CHEST FINDINGS Cardiovascular: Preferential opacification of the thoracic aorta. No evidence of thoracic aortic aneurysm or dissection. Normal heart size. No pericardial effusion. Mediastinum/Nodes: Moderate size sliding-type hiatal hernia. Thyroid gland is unremarkable. No adenopathy is noted. Lungs/Pleura: No pneumothorax or pleural effusion is noted. Mild bibasilar subsegmental atelectasis is noted. Musculoskeletal: No chest wall abnormality. No acute or significant osseous findings. Review of the MIP images confirms the above findings. CTA ABDOMEN AND PELVIS FINDINGS VASCULAR Aorta: Normal caliber aorta without aneurysm, dissection, vasculitis or significant stenosis. Celiac: Patent without evidence of aneurysm, dissection, vasculitis or significant stenosis. SMA: Patent without evidence of aneurysm, dissection, vasculitis or significant  stenosis. Renals: Both renal arteries are patent without evidence of aneurysm, dissection, vasculitis, fibromuscular dysplasia or significant stenosis. IMA: Patent without evidence of aneurysm, dissection, vasculitis or significant stenosis. Inflow: Patent without evidence of aneurysm, dissection, vasculitis or significant stenosis. Veins: No obvious venous abnormality within the limitations of  this arterial phase study. Review of the MIP images confirms the above findings. NON-VASCULAR Hepatobiliary: No focal liver abnormality is seen. No gallstones, gallbladder wall thickening, or biliary dilatation. Pancreas: Unremarkable. No pancreatic ductal dilatation or surrounding inflammatory changes. Spleen: Normal in size without focal abnormality. Adrenals/Urinary Tract: Adrenal glands are unremarkable. Kidneys are normal, without renal calculi, focal lesion, or hydronephrosis. Bladder is unremarkable. Stomach/Bowel: The stomach is unremarkable. There is no small bowel dilatation. The colon appears to be moderately to severely dilated and full of stool. There appears to be some degree of wall thickening involving the sigmoid colon suggesting some degree of infectious or inflammatory colitis. Lymphatic: No adenopathy is noted. Reproductive: Status post hysterectomy. No adnexal masses. Other: No abdominal wall hernia or abnormality. No abdominopelvic ascites. Musculoskeletal: No acute or significant osseous findings. Review of the MIP images confirms the above findings. IMPRESSION: No evidence of thoracic or abdominal aortic dissection or aneurysm. Moderate to severe dilatation of the colon is noted with large amount of stool seen throughout the colon. There appears to be some degree of wall thickening involving the sigmoid colon suggesting infectious or inflammatory colitis. Moderate size sliding-type hiatal hernia. Mild bibasilar subsegmental atelectasis. Electronically Signed   By: Marijo Conception M.D.   On: 07/05/2022  13:33   CT ANGIO HEAD NECK W WO CM (CODE STROKE)  Result Date: 07/05/2022 CLINICAL DATA:  Left-sided weakness. EXAM: CT ANGIOGRAPHY HEAD AND NECK TECHNIQUE: Multidetector CT imaging of the head and neck was performed using the standard protocol during bolus administration of intravenous contrast. Multiplanar CT image reconstructions and MIPs were obtained to evaluate the vascular anatomy. Carotid stenosis measurements (when applicable) are obtained utilizing NASCET criteria, using the distal internal carotid diameter as the denominator. RADIATION DOSE REDUCTION: This exam was performed according to the departmental dose-optimization program which includes automated exposure control, adjustment of the mA and/or kV according to patient size and/or use of iterative reconstruction technique. CONTRAST:  72m OMNIPAQUE IOHEXOL 350 MG/ML SOLN COMPARISON:  Head and neck CTA 04/30/2022 FINDINGS: CTA NECK FINDINGS Aortic arch: Normal variant aortic arch branching pattern with common origin of the brachiocephalic and left common carotid arteries. Wide patency of the brachiocephalic and subclavian arteries. Right carotid system: Patent without evidence of a stenosis, dissection, or significant atherosclerosis. Unchanged beading of the distal cervical ICA. Left carotid system: Patent with minimal atheromatous plaque noted in the carotid bulb. No evidence of a significant stenosis or dissection. Unchanged beading of the distal cervical ICA. Vertebral arteries: Patent without evidence of a stenosis, dissection, or significant atherosclerosis. Strongly dominant right vertebral artery. Unchanged 4 mm inferiorly directed outpouching from the right V3 segment at the C1 level. Skeleton: Advanced disc degeneration at C5-6. Interbody ankylosis at C6-7. Advanced facet arthrosis with ankylosis at C4-5. 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 No evidence of cervical lymphadenopathy. Upper chest: No  apical lung consolidation or mass. Review of the MIP images confirms the above findings CTA HEAD FINDINGS Anterior circulation: The internal carotid arteries are widely patent from skull base to carotid termini. ACAs and MCAs are patent without evidence of a proximal branch occlusion or significant proximal stenosis. No aneurysm is identified. Posterior circulation: The intracranial right vertebral artery is widely patent and supplies the basilar. The left vertebral artery is diminutive and poorly visualized distal to the PICA origin. Patent bilateral PICA, right AICA, and bilateral SCA origins are visualized. The basilar artery is widely patent. There are small right and  large left posterior communicating arteries with hypoplasia of the left P1 segment. Both PCAs are patent without evidence of a significant proximal stenosis. No aneurysm is identified. Venous sinuses: Poorly evaluated due to arterial contrast timing. Anatomic variants: Predominantly fetal type origin of the left PCA. Review of the MIP images confirms the above findings These results were communicated to Dr. Leonel Ramsay at [place] am on 07/05/2022 by text page via the [facility] messaging system. IMPRESSION: 1. Minimal atherosclerosis without an emergent large vessel occlusion or significant proximal stenosis in the head or neck. 2. 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 3. 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 Electronically Signed   By: Logan Bores M.D.   On: 07/05/2022 11:38   CT HEAD CODE STROKE WO CONTRAST  Result Date: 07/05/2022 CLINICAL DATA:  Code stroke.  Left-sided weakness. EXAM: CT HEAD WITHOUT CONTRAST TECHNIQUE: Contiguous axial images were obtained from the base of the skull through the vertex without intravenous contrast. RADIATION DOSE REDUCTION: This exam was performed according to the departmental dose-optimization program which includes  automated exposure control, adjustment of the mA and/or kV according to patient size and/or use of iterative reconstruction technique. COMPARISON:  Head CT and MRI 04/30/2022 FINDINGS: Brain: There is no evidence of an acute infarct, intracranial hemorrhage, mass, midline shift, or extra-axial fluid collection. The ventricles are normal in size. Hypodensities in the cerebral white matter bilaterally are unchanged and nonspecific but compatible with mild chronic small vessel ischemic disease. Vascular: Calcified atherosclerosis at the skull base. No hyperdense vessel. Skull: No fracture or suspicious osseous lesion. Small bilateral parietal skull osteomas. Sinuses/Orbits: Bilateral cataract extraction. Visualized paranasal sinuses and mastoid air cells are clear. Other: None. ASPECTS [facility] Stroke Program Early CT Score) - Ganglionic level infarction (caudate, lentiform nuclei, internal capsule, insula, M1-M3 cortex): 7 - Supraganglionic infarction (M4-M6 cortex): 3 Total score (0-10 with 10 being normal): 10 Preliminary findings were communicated by telephone to Dr. Leonel Ramsay on 07/05/2022 at 11:01 a.m. IMPRESSION: 1. No evidence of acute intracranial abnormality. ASPECTS of 10. 2. Mild chronic small vessel ischemic disease. Electronically Signed   By: Logan Bores M.D.   On: 07/05/2022 11:21    Pertinent labs & imaging results that were available during my care of the patient were reviewed by me and considered in my medical decision making (see MDM for details).  Medications Ordered in ED Medications  0.9 %  sodium chloride infusion (250 mLs Intravenous Not Given 07/05/22 1416)  norepinephrine (LEVOPHED) '4mg'$  in 225m (0.016 mg/mL) premix infusion (5 mcg/min Intravenous Rate/Dose Change 07/05/22 1409)  fentaNYL (SUBLIMAZE) injection 50 mcg (50 mcg Intravenous Not Given 07/05/22 1321)  metroNIDAZOLE (FLAGYL) IVPB 500 mg (500 mg Intravenous New Bag/Given 07/05/22 1436)  heparin injection 5,000 Units (has  no administration in time range)  lactated ringers infusion (has no administration in time range)  docusate sodium (COLACE) capsule 100 mg (has no administration in time range)  polyethylene glycol (MIRALAX / GLYCOLAX) packet 17 g (has no administration in time range)  aspirin chewable tablet 81 mg (81 mg Oral Given 07/05/22 1456)  levothyroxine (SYNTHROID) tablet 88 mcg (has no administration in time range)  bisacodyl (DULCOLAX) suppository 10 mg (has no administration in time range)  ondansetron (ZOFRAN) injection 4 mg (4 mg Intravenous Given 07/05/22 1128)  iohexol (OMNIPAQUE) 350 MG/ML injection 75 mL (75 mLs Intravenous Contrast Given 07/05/22 1110)  sodium chloride 0.9 % bolus 1,000 mL (0 mLs Intravenous Stopped  07/05/22 1243)  acetaminophen (TYLENOL) tablet 1,000 mg (1,000 mg Oral Given 07/05/22 1243)  ondansetron (ZOFRAN) injection 4 mg (4 mg Intravenous Given 07/05/22 1243)  fentaNYL (SUBLIMAZE) 100 MCG/2ML injection (100 mcg  Given 07/05/22 1320)  iohexol (OMNIPAQUE) 350 MG/ML injection 75 mL (75 mLs Intravenous Contrast Given 07/05/22 1316)  cefTRIAXone (ROCEPHIN) 2 g in sodium chloride 0.9 % 100 mL IVPB (0 g Intravenous Stopped 07/05/22 1435)  lactated ringers bolus 1,000 mL (1,000 mLs Intravenous New Bag/Given 07/05/22 1426)                                                                                                                                     Procedures .Critical Care  Performed by: Cristie Hem, MD Authorized by: Cristie Hem, MD   Critical care provider statement:    Critical care time (minutes):  30   Critical care was necessary to treat or prevent imminent or life-threatening deterioration of the following conditions:  Sepsis, shock and CNS failure or compromise   Critical care was time spent personally by me on the following activities:  Development of treatment plan with patient or surrogate, discussions with consultants, evaluation of patient's  response to treatment, examination of patient, ordering and review of laboratory studies, ordering and review of radiographic studies, ordering and performing treatments and interventions, pulse oximetry, re-evaluation of patient's condition and review of old charts   (including critical care time)  Medical Decision Making / ED Course   MDM:  81 year old female presenting as a code stroke with weakness.  Patient initially evaluated as a code stroke by neurology and myself for possible weakness called by EMS.  On evaluation in the emergency department, neurology felt patient had may be slight left facial droop, CT head and neck were performed without evidence of acute stroke, and on further history it felt less likely to be related to a stroke. tPA was considered but not given as stroke felt less likely.  On further history, patient reports her primary complaint is abdominal pain.  She reports constipation.  She also reports urinary symptoms.  Differential includes perforation, obstruction, fecal impaction, volvulus.  While in the emergency department, patient initially had mild low blood pressure which responded to 1 L of fluids.  She subsequently had worsening hypotension, gave additional liter of fluids, and sent for CT scan of the chest abdomen pelvis to evaluate for aortic pathology such as dissection or aneurysm.  Patient required blood pressure support with norepinephrine given significant hypotension.  Patient also appeared with increasing pain and ill.  Will monitor and CT scan results.  Will obtain blood cultures and type and screen.  Clinical Course as of 07/05/22 1457  Fri Jul 05, 2022  1428 Patient admitted to ICU. CT scan without evidence of dissection, ruptured AAA.  Shows diffuse colitis.  Patient likely hypotensive due to sepsis from colitis.  Blood pressure improved and mentation improved with  IV fluids and Levophed. [WS]    Clinical Course User Index [WS] Cristie Hem,  MD     Additional history obtained: -Additional history obtained from family and ems -External records from outside source obtained and reviewed including: Chart review including previous notes, labs, imaging, consultation notes including UC visit 06/17/22   Lab Tests: -I ordered, reviewed, and interpreted labs.   The pertinent results include:   Labs Reviewed  APTT - Abnormal; Notable for the following components:      Result Value   aPTT 22 (*)    All other components within normal limits  CBC - Abnormal; Notable for the following components:   RBC 5.32 (*)    Hemoglobin 16.3 (*)    HCT 49.8 (*)    All other components within normal limits  COMPREHENSIVE METABOLIC PANEL - Abnormal; Notable for the following components:   CO2 21 (*)    Glucose, Bld 139 (*)    AST 48 (*)    Alkaline Phosphatase 166 (*)    GFR, Estimated 59 (*)    All other components within normal limits  I-STAT CHEM 8, ED - Abnormal; Notable for the following components:   Glucose, Bld 139 (*)    TCO2 20 (*)    Hemoglobin 17.3 (*)    HCT 51.0 (*)    All other components within normal limits  CBG MONITORING, ED - Abnormal; Notable for the following components:   Glucose-Capillary 136 (*)    All other components within normal limits  URINE CULTURE  CULTURE, BLOOD (ROUTINE X 2)  CULTURE, BLOOD (ROUTINE X 2)  ETHANOL  PROTIME-INR  DIFFERENTIAL  RAPID URINE DRUG SCREEN, HOSP PERFORMED  URINALYSIS, ROUTINE W REFLEX MICROSCOPIC  TSH  LACTIC ACID, PLASMA  LACTIC ACID, PLASMA  TYPE AND SCREEN    Notable for mildly decreased CO2, mild hyperglycemia, mild hemoconcentration  EKG   EKG Interpretation  Date/Time:  Friday July 05 2022 11:21:08 EST Ventricular Rate:  96 PR Interval:  150 QRS Duration: 85 QT Interval:  341 QTC Calculation: 431 R Axis:   59 Text Interpretation: Sinus arrhythmia Confirmed by Garnette Gunner [phone]) on 07/05/2022 11:31:28 AM         Imaging Studies ordered: I  ordered imaging studies including CT head/neck, CT C/A/P On my interpretation imaging demonstrates no dissection/AAA, diffuse colitis I independently visualized and interpreted imaging. I agree with the radiologist interpretation   Medicines ordered and prescription drug management: Meds ordered this encounter  Medications   ondansetron (ZOFRAN) injection 4 mg   iohexol (OMNIPAQUE) 350 MG/ML injection 75 mL   sodium chloride 0.9 % bolus 1,000 mL   acetaminophen (TYLENOL) tablet 1,000 mg   ondansetron (ZOFRAN) injection 4 mg   DISCONTD: norepinephrine (LEVOPHED) '4mg'$  in 250m (0.016 mg/mL) premix infusion   norepinephrine (LEVOPHED) 4-5 MG/250ML-% infusion SOLN    [place], J[place]: cabinet override   0.9 %  sodium chloride infusion   norepinephrine (LEVOPHED) '4mg'$  in 2541m(0.016 mg/mL) premix infusion    Order Specific Question:   IV Access    Answer:   Peripheral   fentaNYL (SUBLIMAZE) 100 MCG/2ML injection    [place]: cabinet override   fentaNYL (SUBLIMAZE) injection 50 mcg   iohexol (OMNIPAQUE) 350 MG/ML injection 75 mL   cefTRIAXone (ROCEPHIN) 2 g in sodium chloride 0.9 % 100 mL IVPB    Order Specific Question:   Antibiotic Indication:    Answer:   Intra-abdominal   metroNIDAZOLE (FLAGYL) IVPB 500 mg  Order Specific Question:   Antibiotic Indication:    Answer:   Intra-abdominal Infection   lactated ringers bolus 1,000 mL   heparin injection 5,000 Units   lactated ringers infusion   docusate sodium (COLACE) capsule 100 mg   polyethylene glycol (MIRALAX / GLYCOLAX) packet 17 g   aspirin chewable tablet 81 mg   levothyroxine (SYNTHROID) tablet 88 mcg   bisacodyl (DULCOLAX) suppository 10 mg    -I have reviewed the patients home medicines and have made adjustments as needed   Consultations Obtained: I requested consultation with the intensivist and neurologist,  and discussed lab and imaging findings as well as pertinent plan - they recommend: less likely  stroke. Intensivist agrees with ICU admission for BP support   Cardiac Monitoring: The patient was maintained on a cardiac monitor.  I personally viewed and interpreted the cardiac monitored which showed an underlying rhythm of: NSR  Reevaluation: After the interventions noted above, I reevaluated the patient and found that they have improved  Co morbidities that complicate the patient evaluation  Past Medical History:  Diagnosis Date   Cancer ([place]) 2014   kidney   Esophageal stricture    Fibromyalgia    GERD (gastroesophageal reflux disease)    Helicobacter pylori gastritis    Hypertension    Intention tremor    Lumbago    Memory loss    Osteoarthritis of spine    Pneumonia    hx of years ago    PUD (peptic ulcer disease)    PVD (peripheral vascular disease) ([place])       Dispostion: Disposition decision including need for hospitalization was considered, and patient admitted to the hospital.    Final Clinical Impression(s) / ED Diagnoses Final diagnoses:  Sepsis, due to unspecified organism, unspecified whether acute organ dysfunction present [facility])  Colitis  Generalized weakness  Syncope and collapse     This chart was dictated using voice recognition software.  Despite best efforts to proofread,  errors can occur which can change the documentation meaning.    Cristie Hem, MD 07/05/22 [phone]

## 2022-07-05 NOTE — Transfer of Care (Signed)
Immediate Anesthesia Transfer of Care Note  Patient: [name]  Procedure(s) Performed: EXPLORATORY LAPAROTOMY (Abdomen)  Patient Location: ICU  Anesthesia Type:General  Level of Consciousness: Patient remains intubated per anesthesia plan  Airway & Oxygen Therapy: Patient remains intubated per anesthesia plan and Patient placed on Ventilator (see vital sign flow sheet for setting)  Post-op Assessment: Report given to RN and Post -op Vital signs reviewed and stable  Post vital signs: Reviewed and stable  Last Vitals:  Vitals Value Taken Time  BP 104/77 07/05/22 1923  Temp    Pulse 102 07/05/22 1928  Resp 17 07/05/22 1928  SpO2 100 % 07/05/22 1928  Vitals shown include unvalidated device data.  Last Pain:  Vitals:   07/05/22 1724  TempSrc:   PainSc: 10-Worst pain ever         Complications: No notable events documented.

## 2022-07-05 NOTE — Consult Note (Signed)
Neurology Consultation Reason for Consult: Slurred speech Referring Physician: Sherrye Payor  CC: Slurred speech  History is obtained from: Patient  HPI: [name] is a 81 y.o. female who was in her normal state of health earlier today.  She has been having trouble with having a bowel movement and took laxatives last night but still has been unsuccessful.  This morning she was sitting on the toilet, and then her daughter found her slumped over.  Unclear if there was any LOC.  She was having significant nausea and vomiting.  EMS was called and they noticed that she was slurring her speech and therefore activated a code stroke.  On arrival to [facility] [address], her systolic was in the low 01X and she was given a bolus of fluids with rapid improvement in her symptoms.   LKW: 9:30 AM tpa given?: no, mild symptoms  Past Medical History:  Diagnosis Date   Cancer ([place]) 2014   kidney   Esophageal stricture    Fibromyalgia    GERD (gastroesophageal reflux disease)    Helicobacter pylori gastritis    Hypertension    Intention tremor    Lumbago    Memory loss    Osteoarthritis of spine    Pneumonia    hx of years ago    PUD (peptic ulcer disease)    PVD (peripheral vascular disease) ([place])      Family History  Problem Relation Age of Onset   Heart disease Mother    COPD Mother    Thyroid disease Mother    Diabetes Father    Heart disease Father        CABG   Stroke Father    Parkinson's disease Father    Heart disease Brother    Stroke Brother    Hyperlipidemia Brother    Diabetes Daughter    Hearing loss Brother    Crohn's disease Other        neice   Hypertension Son    Diabetes Son    Melanoma Son    Colon cancer Neg Hx    Stomach cancer Neg Hx    Esophageal cancer Neg Hx      Social History:  reports that she has never smoked. She has never used smokeless tobacco. She reports current alcohol use of about 7.0 standard drinks of alcohol per week. She reports that she  does not use drugs.   Exam: Current vital signs: BP (!) 108/59   Pulse 82   Temp 97.8 F (36.6 C) (Oral)   Resp 17   Wt 68.8 kg   SpO2 98%   BMI 27.74 kg/m  Vital signs in last 24 hours: Temp:  [97.8 F (36.6 C)] 97.8 F (36.6 C) (11/24 1125) Pulse Rate:  [82-114] 82 (11/24 1215) Resp:  [17-26] 17 (11/24 1215) BP: (77-108)/(36-62) 108/59 (11/24 1215) SpO2:  [92 %-98 %] 98 % (11/24 1215) Weight:  [68.8 kg] 68.8 kg (11/24 1000)   Physical Exam  Constitutional: Appears well-developed and well-nourished.  Psych: Affect appropriate to situation Eyes: No scleral injection HENT: No OP obstruction MSK: no joint deformities.  Cardiovascular: Normal rate and regular rhythm.  Respiratory: Effort normal, non-labored breathing GI: Soft.  No distension. There is no tenderness.  Skin: WDI  Neuro: Mental Status: Patient is awake, alert, oriented to person, place, month, year, and situation. Patient is able to give a clear and coherent history. No signs of aphasia or neglect Cranial Nerves: II: Visual Fields are full. Pupils are  equal, round, and reactive to light.   III,IV, VI: EOMI without ptosis or diploplia.  V: Facial sensation is symmetric to temperature VII: I did initially question a mild left facial weakness, but this improved following bolus VIII: hearing is intact to voice X: Uvula elevates symmetrically XI: Shoulder shrug is symmetric. XII: tongue is midline without atrophy or fasciculations.  Motor: Tone is normal. Bulk is normal. 5/5 strength was present in bilateral arms, gives good resistance and symmetrically in lower extremities, but does allow both to drift. Sensory: Sensation is symmetric to light touch and temperature in the arms and legs. Cerebellar: Does not perform   I have reviewed labs in epic and the results pertinent to this consultation are: CMP-unremarkable  I have reviewed the images obtained: CT head-unremarkable, CTA-no pertinent  findings  Impression: 81 year old female with decreased responsiveness, slurred speech in the setting of severe hypotension.  My suspicion is that this has been primarily driven by hypoperfusion, with a much lower suspicion for embolic or thrombotic stroke.  Given there was a question of focality, I do think it is reasonable to do an MRI, but if this is negative I would not pursue drove her up any further.  Given that she was likely bearing down on the toilet prior to symptoms, I do wonder about a vagal event.  Recommendations: 1) MRI brain, stroke workup only if positive 2) if negative, will defer workup of hypotension to ER/internal medicine and neurology will be available on an as-needed basis.   Roland Rack, MD [facility] [phone]  If 7pm- 7am, please page neurology on call as listed in [place].

## 2022-07-05 NOTE — Procedures (Signed)
Central Venous Catheter Insertion Procedure Note  Brynlyn Dade  [id number]  Oct 29, 1940  Date:07/05/22  Time:5:41 PM   Provider Performing:Josalynn Johndrow Shearon Stalls   Procedure: Insertion of Non-tunneled Central Venous [phone]) with US guidance (25053)   Indication(s) Medication administration  Consent Risks of the procedure as well as the alternatives and risks of each were explained to the patient and/or caregiver.  Consent for the procedure was obtained and is signed in the bedside chart  Anesthesia Topical only with 1% lidocaine   Timeout Verified patient identification, verified procedure, site/side was marked, verified correct patient position, special equipment/implants available, medications/allergies/relevant history reviewed, required imaging and test results available.  Sterile Technique Maximal sterile technique including full sterile barrier drape, hand hygiene, sterile gown, sterile gloves, mask, hair covering, sterile ultrasound probe cover (if used).  Procedure Description Area of catheter insertion was cleaned with chlorhexidine and draped in sterile fashion.  With real-time ultrasound guidance a central venous catheter was placed into the left internal jugular vein. Nonpulsatile blood flow and easy flushing noted in all ports.  The catheter was sutured in place and sterile dressing applied.  Complications/Tolerance None; patient tolerated the procedure well. Chest X-ray is ordered to verify placement for internal jugular or subclavian cannulation.   Chest x-ray is not ordered for femoral cannulation.  EBL Minimal  Specimen(s) None   Montey Hora, PA - [facility] For pager details, please see AMION or use Epic chat  After 1900, please call [facility] for cross coverage needs 07/05/2022, 5:41 PM

## 2022-07-05 NOTE — ED Notes (Signed)
CC PA at bedisde placing central line.

## 2022-07-05 NOTE — ED Notes (Signed)
MD notified of BP 46/39, MD to room to assess pt. Additional 1 L NS hung. Verbal order for Levophed gtt.

## 2022-07-05 NOTE — H&P (Signed)
CC/Reason for consult: Possible bowel perforation  Requesting physician: Dr. Brand Males, MD  HPI: [name] is an 81 y.o. female hx of HTN, HLD, GERD, chronic constipation, presented following pre-syncopal event earlier today. States she struggles with constipation and had taken [employer] today. She was trying to have a BM when she became light headed. Reports she has had severe abdominal pain here this afternoon. Nothing like this before. She underwent workup including CT A/P ~1pm. She reports worsening pain since, now exquisitely tender throughout and has had been found to be in shock. Despite fluid resuscitation and now 2 pressor vasopressor support she has continued to have clinical deterioration without any other source.  CT Head without acute abnormality CTA head/neck without significant acute events  CTA CAP  No evidence of thoracic or abdominal aortic dissection or aneurysm.   Moderate to severe dilatation of the colon is noted with large amount of stool seen throughout the colon. There appears to be some degree of wall thickening involving the sigmoid colon suggesting infectious or inflammatory colitis.   Moderate size sliding-type hiatal hernia.   Mild bibasilar subsegmental atelectasis.   UA negative   Past Medical History:  Diagnosis Date   Cancer ([place]) 2014   kidney   Esophageal stricture    Fibromyalgia    GERD (gastroesophageal reflux disease)    Helicobacter pylori gastritis    Hypertension    Intention tremor    Lumbago    Memory loss    Osteoarthritis of spine    Pneumonia    hx of years ago    PUD (peptic ulcer disease)    PVD (peripheral vascular disease) ([place])     Past Surgical History:  Procedure Laterality Date   ABDOMINAL HYSTERECTOMY  1985   BREAST ENHANCEMENT SURGERY  1974   CATARACT EXTRACTION Bilateral 2014   shapiro   LAPAROSCOPIC PARTIAL NEPHRECTOMY Left April '13   [facility], Dr. Alinda Money   LUMBAR FUSION  03/2003    L4-5   TONSILLECTOMY  1958   TUBAL LIGATION  1984    Family History  Problem Relation Age of Onset   Heart disease Mother    COPD Mother    Thyroid disease Mother    Diabetes Father    Heart disease Father        CABG   Stroke Father    Parkinson's disease Father    Heart disease Brother    Stroke Brother    Hyperlipidemia Brother    Diabetes Daughter    Hearing loss Brother    Crohn's disease Other        neice   Hypertension Son    Diabetes Son    Melanoma Son    Colon cancer Neg Hx    Stomach cancer Neg Hx    Esophageal cancer Neg Hx     Social:  reports that she has never smoked. She has never used smokeless tobacco. She reports current alcohol use of about 7.0 standard drinks of alcohol per week. She reports that she does not use drugs.  Allergies:  Allergies  Allergen Reactions   Morphine And Related Itching and Rash    Given in IV.    Medications: I have reviewed the patient's current medications.  Results for orders placed or performed during the hospital encounter of 07/05/22 (from the past 48 hour(s))  CBG monitoring, ED     Status: Abnormal   Collection Time: 07/05/22 10:51 AM  Result Value Ref Range  Glucose-Capillary 136 (H) 70 - 99 mg/dL    Comment: Glucose reference range applies only to samples taken after fasting for at least 8 hours.  Ethanol     Status: None   Collection Time: 07/05/22 10:55 AM  Result Value Ref Range   Alcohol, Ethyl (B) <10 <10 mg/dL    Comment: (NOTE) Lowest detectable limit for serum alcohol is 10 mg/dL.  For medical purposes only. Performed at [facility], [place] [address], [place], [place] [address]   Protime-INR     Status: None   Collection Time: 07/05/22 10:55 AM  Result Value Ref Range   Prothrombin Time 13.4 11.4 - 15.2 seconds   INR 1.0 0.8 - 1.2    Comment: (NOTE) INR goal varies based on device and disease states. Performed at [facility], [place] [address] [place], [place] [address]    APTT     Status: Abnormal   Collection Time: 07/05/22 10:55 AM  Result Value Ref Range   aPTT 22 (L) 24 - 36 seconds    Comment: Performed at [facility] [address] [place], [place] [address]  CBC     Status: Abnormal   Collection Time: 07/05/22 10:55 AM  Result Value Ref Range   WBC 5.9 4.0 - 10.5 K/uL   RBC 5.32 (H) 3.87 - 5.11 MIL/uL   Hemoglobin 16.3 (H) 12.0 - 15.0 g/dL   HCT 49.8 (H) 36.0 - 46.0 %   MCV 93.6 80.0 - 100.0 fL   MCH 30.6 26.0 - 34.0 pg   MCHC 32.7 30.0 - 36.0 g/dL   RDW 13.3 11.5 - 15.5 %   Platelets 300 150 - 400 K/uL   nRBC 0.0 0.0 - 0.2 %    Comment: Performed at [facility], [place] [address], [place], [place] [address]  Differential     Status: None   Collection Time: 07/05/22 10:55 AM  Result Value Ref Range   Neutrophils Relative % 33 %   Neutro Abs 1.9 1.7 - 7.7 K/uL   Lymphocytes Relative 63 %   Lymphs Abs 3.7 0.7 - 4.0 K/uL   Monocytes Relative 3 %   Monocytes Absolute 0.2 0.1 - 1.0 K/uL   Eosinophils Relative 1 %   Eosinophils Absolute 0.1 0.0 - 0.5 K/uL   Basophils Relative 0 %   Basophils Absolute 0.0 0.0 - 0.1 K/uL   Immature Granulocytes 0 %   Abs Immature Granulocytes 0.00 0.00 - 0.07 K/uL    Comment: Performed at [facility], [place] [address], [place],  [address]  Comprehensive metabolic panel     Status: Abnormal   Collection Time: 07/05/22 10:55 AM  Result Value Ref Range   Sodium 138 135 - 145 mmol/L   Potassium 4.6 3.5 - 5.1 mmol/L   Chloride 103 98 - 111 mmol/L   CO2 21 (L) 22 - 32 mmol/L   Glucose, Bld 139 (H) 70 - 99 mg/dL    Comment: Glucose reference range applies only to samples taken after fasting for at least 8 hours.   BUN 20 8 - 23 mg/dL   Creatinine, Ser 0.96 0.44 - 1.00 mg/dL   Calcium 9.7 8.9 - 10.3 mg/dL   Total Protein 6.9 6.5 - 8.1 g/dL   Albumin 3.9 3.5 - 5.0 g/dL   AST 48 (H) 15 - 41 U/L   ALT 36 0 - 44 U/L   Alkaline Phosphatase 166 (H) 38 - 126 U/L  Total Bilirubin 0.5 0.3  - 1.2 mg/dL   GFR, Estimated 59 (L) >60 mL/min    Comment: (NOTE) Calculated using the CKD-EPI Creatinine Equation (2021)    Anion gap 14 5 - 15    Comment: Performed at [place] [facility] [address], [place], [place] [address]  I-stat chem 8, ED     Status: Abnormal   Collection Time: 07/05/22 10:58 AM  Result Value Ref Range   Sodium 137 135 - 145 mmol/L   Potassium 4.6 3.5 - 5.1 mmol/L   Chloride 105 98 - 111 mmol/L   BUN 22 8 - 23 mg/dL   Creatinine, Ser 0.70 0.44 - 1.00 mg/dL   Glucose, Bld 139 (H) 70 - 99 mg/dL    Comment: Glucose reference range applies only to samples taken after fasting for at least 8 hours.   Calcium, Ion 1.17 1.15 - 1.40 mmol/L   TCO2 20 (L) 22 - 32 mmol/L   Hemoglobin 17.3 (H) 12.0 - 15.0 g/dL   HCT 51.0 (H) 36.0 - 46.0 %  Urinalysis, Routine w reflex microscopic Urine, Catheterized     Status: Abnormal   Collection Time: 07/05/22 11:55 AM  Result Value Ref Range   Color, Urine YELLOW YELLOW   APPearance CLEAR CLEAR   Specific Gravity, Urine 1.033 (H) 1.005 - 1.030   pH 6.0 5.0 - 8.0   Glucose, UA NEGATIVE NEGATIVE mg/dL   Hgb urine dipstick NEGATIVE NEGATIVE   Bilirubin Urine NEGATIVE NEGATIVE   Ketones, ur NEGATIVE NEGATIVE mg/dL   Protein, ur NEGATIVE NEGATIVE mg/dL   Nitrite NEGATIVE NEGATIVE   Leukocytes,Ua NEGATIVE NEGATIVE    Comment: Performed at [facility] [address], [place], [place] [address]  Type and screen [place]     Status: None   Collection Time: 07/05/22  2:00 PM  Result Value Ref Range   ABO/RH(D) O POS    Antibody Screen NEG    Sample Expiration      07/08/2022,2359 Performed at [facility], [place] [address], [place], [place] [address]   Lactic acid, plasma     Status: Abnormal   Collection Time: 07/05/22  2:47 PM  Result Value Ref Range   Lactic Acid, Venous 3.7 (HH) 0.5 - 1.9 mmol/L    Comment: CRITICAL RESULT CALLED TO, READ BACK BY AND VERIFIED WITH J,NICKERSON RN '@1553'$   07/05/22 E,BENTON Performed at [facility], [place] [address], [place], [place] [address]     CT Angio Chest/Abd/Pel for Dissection W and/or Wo Contrast  Result Date: 07/05/2022 CLINICAL DATA:  Acute aortic syndrome. EXAM: CT ANGIOGRAPHY CHEST, ABDOMEN AND PELVIS TECHNIQUE: Non-contrast CT of the chest was initially obtained. Multidetector CT imaging through the chest, abdomen and pelvis was performed using the standard protocol during bolus administration of intravenous contrast. Multiplanar reconstructed images and MIPs were obtained and reviewed to evaluate the vascular anatomy. RADIATION DOSE REDUCTION: This exam was performed according to the departmental dose-optimization program which includes automated exposure control, adjustment of the mA and/or kV according to patient size and/or use of iterative reconstruction technique. CONTRAST:  19m OMNIPAQUE IOHEXOL 350 MG/ML SOLN COMPARISON:  May 16, 2020. FINDINGS: CTA CHEST FINDINGS Cardiovascular: Preferential opacification of the thoracic aorta. No evidence of thoracic aortic aneurysm or dissection. Normal heart size. No pericardial effusion. Mediastinum/Nodes: Moderate size sliding-type hiatal hernia. Thyroid gland is unremarkable. No adenopathy is noted. Lungs/Pleura: No pneumothorax or pleural effusion is noted. Mild bibasilar subsegmental atelectasis  is noted. Musculoskeletal: No chest wall abnormality. No acute or significant osseous findings. Review of the MIP images confirms the above findings. CTA ABDOMEN AND PELVIS FINDINGS VASCULAR Aorta: Normal caliber aorta without aneurysm, dissection, vasculitis or significant stenosis. Celiac: Patent without evidence of aneurysm, dissection, vasculitis or significant stenosis. SMA: Patent without evidence of aneurysm, dissection, vasculitis or significant stenosis. Renals: Both renal arteries are patent without evidence of aneurysm, dissection, vasculitis, fibromuscular dysplasia or significant  stenosis. IMA: Patent without evidence of aneurysm, dissection, vasculitis or significant stenosis. Inflow: Patent without evidence of aneurysm, dissection, vasculitis or significant stenosis. Veins: No obvious venous abnormality within the limitations of this arterial phase study. Review of the MIP images confirms the above findings. NON-VASCULAR Hepatobiliary: No focal liver abnormality is seen. No gallstones, gallbladder wall thickening, or biliary dilatation. Pancreas: Unremarkable. No pancreatic ductal dilatation or surrounding inflammatory changes. Spleen: Normal in size without focal abnormality. Adrenals/Urinary Tract: Adrenal glands are unremarkable. Kidneys are normal, without renal calculi, focal lesion, or hydronephrosis. Bladder is unremarkable. Stomach/Bowel: The stomach is unremarkable. There is no small bowel dilatation. The colon appears to be moderately to severely dilated and full of stool. There appears to be some degree of wall thickening involving the sigmoid colon suggesting some degree of infectious or inflammatory colitis. Lymphatic: No adenopathy is noted. Reproductive: Status post hysterectomy. No adnexal masses. Other: No abdominal wall hernia or abnormality. No abdominopelvic ascites. Musculoskeletal: No acute or significant osseous findings. Review of the MIP images confirms the above findings. IMPRESSION: No evidence of thoracic or abdominal aortic dissection or aneurysm. Moderate to severe dilatation of the colon is noted with large amount of stool seen throughout the colon. There appears to be some degree of wall thickening involving the sigmoid colon suggesting infectious or inflammatory colitis. Moderate size sliding-type hiatal hernia. Mild bibasilar subsegmental atelectasis. Electronically Signed   By: Marijo Conception M.D.   On: 07/05/2022 13:33   CT ANGIO HEAD NECK W WO CM (CODE STROKE)  Result Date: 07/05/2022 CLINICAL DATA:  Left-sided weakness. EXAM: CT ANGIOGRAPHY HEAD  AND NECK TECHNIQUE: Multidetector CT imaging of the head and neck was performed using the standard protocol during bolus administration of intravenous contrast. Multiplanar CT image reconstructions and MIPs were obtained to evaluate the vascular anatomy. Carotid stenosis measurements (when applicable) are obtained utilizing NASCET criteria, using the distal internal carotid diameter as the denominator. RADIATION DOSE REDUCTION: This exam was performed according to the departmental dose-optimization program which includes automated exposure control, adjustment of the mA and/or kV according to patient size and/or use of iterative reconstruction technique. CONTRAST:  2m OMNIPAQUE IOHEXOL 350 MG/ML SOLN COMPARISON:  Head and neck CTA 04/30/2022 FINDINGS: CTA NECK FINDINGS Aortic arch: Normal variant aortic arch branching pattern with common origin of the brachiocephalic and left common carotid arteries. Wide patency of the brachiocephalic and subclavian arteries. Right carotid system: Patent without evidence of a stenosis, dissection, or significant atherosclerosis. Unchanged beading of the distal cervical ICA. Left carotid system: Patent with minimal atheromatous plaque noted in the carotid bulb. No evidence of a significant stenosis or dissection. Unchanged beading of the distal cervical ICA. Vertebral arteries: Patent without evidence of a stenosis, dissection, or significant atherosclerosis. Strongly dominant right vertebral artery. Unchanged 4 mm inferiorly directed outpouching from the right V3 segment at the C1 level. Skeleton: Advanced disc degeneration at C5-6. Interbody ankylosis at C6-7. Advanced facet arthrosis with ankylosis at C4-5. 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 No  evidence of cervical lymphadenopathy. Upper chest: No apical lung consolidation or mass. Review of the MIP images confirms the above findings CTA HEAD FINDINGS Anterior circulation: The internal  carotid arteries are widely patent from skull base to carotid termini. ACAs and MCAs are patent without evidence of a proximal branch occlusion or significant proximal stenosis. No aneurysm is identified. Posterior circulation: The intracranial right vertebral artery is widely patent and supplies the basilar. The left vertebral artery is diminutive and poorly visualized distal to the PICA origin. Patent bilateral PICA, right AICA, and bilateral SCA origins are visualized. The basilar artery is widely patent. There are small right and large left posterior communicating arteries with hypoplasia of the left P1 segment. Both PCAs are patent without evidence of a significant proximal stenosis. No aneurysm is identified. Venous sinuses: Poorly evaluated due to arterial contrast timing. Anatomic variants: Predominantly fetal type origin of the left PCA. Review of the MIP images confirms the above findings These results were communicated to Dr. Leonel Ramsay at [place] am on 07/05/2022 by text page via the [facility] messaging system. IMPRESSION: 1. Minimal atherosclerosis without an emergent large vessel occlusion or significant proximal stenosis in the head or neck. 2. 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 3. 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 Electronically Signed   By: Logan Bores M.D.   On: 07/05/2022 11:38   CT HEAD CODE STROKE WO CONTRAST  Result Date: 07/05/2022 CLINICAL DATA:  Code stroke.  Left-sided weakness. EXAM: CT HEAD WITHOUT CONTRAST TECHNIQUE: Contiguous axial images were obtained from the base of the skull through the vertex without intravenous contrast. RADIATION DOSE REDUCTION: This exam was performed according to the departmental dose-optimization program which includes automated exposure control, adjustment of the mA and/or kV according to patient size and/or use of iterative reconstruction technique.  COMPARISON:  Head CT and MRI 04/30/2022 FINDINGS: Brain: There is no evidence of an acute infarct, intracranial hemorrhage, mass, midline shift, or extra-axial fluid collection. The ventricles are normal in size. Hypodensities in the cerebral Johnedward Brodrick matter bilaterally are unchanged and nonspecific but compatible with mild chronic small vessel ischemic disease. Vascular: Calcified atherosclerosis at the skull base. No hyperdense vessel. Skull: No fracture or suspicious osseous lesion. Small bilateral parietal skull osteomas. Sinuses/Orbits: Bilateral cataract extraction. Visualized paranasal sinuses and mastoid air cells are clear. Other: None. ASPECTS [facility] Stroke Program Early CT Score) - Ganglionic level infarction (caudate, lentiform nuclei, internal capsule, insula, M1-M3 cortex): 7 - Supraganglionic infarction (M4-M6 cortex): 3 Total score (0-10 with 10 being normal): 10 Preliminary findings were communicated by telephone to Dr. Leonel Ramsay on 07/05/2022 at 11:01 a.m. IMPRESSION: 1. No evidence of acute intracranial abnormality. ASPECTS of 10. 2. Mild chronic small vessel ischemic disease. Electronically Signed   By: Logan Bores M.D.   On: 07/05/2022 11:21    ROS - all of the below systems have been reviewed with the patient and positives are indicated with bold text General: chills, fever or night sweats Eyes: blurry vision or double vision ENT: epistaxis or sore throat Allergy/Immunology: itchy/watery eyes or nasal congestion Hematologic/Lymphatic: bleeding problems, blood clots or swollen lymph nodes Endocrine: temperature intolerance or unexpected weight changes Breast: new or changing breast lumps or nipple discharge Resp: cough, shortness of breath, or wheezing CV: chest pain or dyspnea on exertion GI: as per HPI GU: dysuria, trouble voiding, or hematuria MSK: joint pain or joint stiffness Neuro: TIA or stroke symptoms Derm: pruritus and skin  lesion changes Psych: anxiety and  depression  PE Blood pressure (!) 121/91, pulse 99, temperature (!) 97.4 F (36.3 C), temperature source Oral, resp. rate (!) 25, weight 68.8 kg, SpO2 98 %. Constitutional: NAD; conversant despite hypotension Eyes: Moist conjunctiva Lungs: Normal respiratory effort CV: RRR GI: Abd distended with some mottling of skin, rigid with diffuse tenderness, +rebound and guarding  Psychiatric: Appropriate affect; alert and oriented x3   Results for orders placed or performed during the hospital encounter of 07/05/22 (from the past 48 hour(s))  CBG monitoring, ED     Status: Abnormal   Collection Time: 07/05/22 10:51 AM  Result Value Ref Range   Glucose-Capillary 136 (H) 70 - 99 mg/dL    Comment: Glucose reference range applies only to samples taken after fasting for at least 8 hours.  Ethanol     Status: None   Collection Time: 07/05/22 10:55 AM  Result Value Ref Range   Alcohol, Ethyl (B) <10 <10 mg/dL    Comment: (NOTE) Lowest detectable limit for serum alcohol is 10 mg/dL.  For medical purposes only. Performed at [facility], [place] [address], [place], [place] [address]   Protime-INR     Status: None   Collection Time: 07/05/22 10:55 AM  Result Value Ref Range   Prothrombin Time 13.4 11.4 - 15.2 seconds   INR 1.0 0.8 - 1.2    Comment: (NOTE) INR goal varies based on device and disease states. Performed at [facility], [place] [address] [place], [place] [address]   APTT     Status: Abnormal   Collection Time: 07/05/22 10:55 AM  Result Value Ref Range   aPTT 22 (L) 24 - 36 seconds    Comment: Performed at [facility] [address], [place], [place] [address]  CBC     Status: Abnormal   Collection Time: 07/05/22 10:55 AM  Result Value Ref Range   WBC 5.9 4.0 - 10.5 K/uL   RBC 5.32 (H) 3.87 - 5.11 MIL/uL   Hemoglobin 16.3 (H) 12.0 - 15.0 g/dL   HCT 49.8 (H) 36.0 - 46.0 %   MCV 93.6 80.0 - 100.0 fL   MCH 30.6 26.0 - 34.0 pg   MCHC 32.7 30.0 - 36.0 g/dL    RDW 13.3 11.5 - 15.5 %   Platelets 300 150 - 400 K/uL   nRBC 0.0 0.0 - 0.2 %    Comment: Performed at [facility], [place] [address], [place], [place] [address]  Differential     Status: None   Collection Time: 07/05/22 10:55 AM  Result Value Ref Range   Neutrophils Relative % 33 %   Neutro Abs 1.9 1.7 - 7.7 K/uL   Lymphocytes Relative 63 %   Lymphs Abs 3.7 0.7 - 4.0 K/uL   Monocytes Relative 3 %   Monocytes Absolute 0.2 0.1 - 1.0 K/uL   Eosinophils Relative 1 %   Eosinophils Absolute 0.1 0.0 - 0.5 K/uL   Basophils Relative 0 %   Basophils Absolute 0.0 0.0 - 0.1 K/uL   Immature Granulocytes 0 %   Abs Immature Granulocytes 0.00 0.00 - 0.07 K/uL    Comment: Performed at [facility], [place] [address] [place], [place] [address]  Comprehensive metabolic panel     Status: Abnormal   Collection Time: 07/05/22 10:55 AM  Result Value Ref Range   Sodium 138 135 - 145 mmol/L   Potassium 4.6 3.5 - 5.1 mmol/L  Chloride 103 98 - 111 mmol/L   CO2 21 (L) 22 - 32 mmol/L   Glucose, Bld 139 (H) 70 - 99 mg/dL    Comment: Glucose reference range applies only to samples taken after fasting for at least 8 hours.   BUN 20 8 - 23 mg/dL   Creatinine, Ser 0.96 0.44 - 1.00 mg/dL   Calcium 9.7 8.9 - 10.3 mg/dL   Total Protein 6.9 6.5 - 8.1 g/dL   Albumin 3.9 3.5 - 5.0 g/dL   AST 48 (H) 15 - 41 U/L   ALT 36 0 - 44 U/L   Alkaline Phosphatase 166 (H) 38 - 126 U/L   Total Bilirubin 0.5 0.3 - 1.2 mg/dL   GFR, Estimated 59 (L) >60 mL/min    Comment: (NOTE) Calculated using the CKD-EPI Creatinine Equation (2021)    Anion gap 14 5 - 15    Comment: Performed at [facility] [address], [place], [place] [address]  I-stat chem 8, ED     Status: Abnormal   Collection Time: 07/05/22 10:58 AM  Result Value Ref Range   Sodium 137 135 - 145 mmol/L   Potassium 4.6 3.5 - 5.1 mmol/L   Chloride 105 98 - 111 mmol/L   BUN 22 8 - 23 mg/dL   Creatinine, Ser 0.70 0.44 - 1.00 mg/dL   Glucose, Bld  139 (H) 70 - 99 mg/dL    Comment: Glucose reference range applies only to samples taken after fasting for at least 8 hours.   Calcium, Ion 1.17 1.15 - 1.40 mmol/L   TCO2 20 (L) 22 - 32 mmol/L   Hemoglobin 17.3 (H) 12.0 - 15.0 g/dL   HCT 51.0 (H) 36.0 - 46.0 %  Urinalysis, Routine w reflex microscopic Urine, Catheterized     Status: Abnormal   Collection Time: 07/05/22 11:55 AM  Result Value Ref Range   Color, Urine YELLOW YELLOW   APPearance CLEAR CLEAR   Specific Gravity, Urine 1.033 (H) 1.005 - 1.030   pH 6.0 5.0 - 8.0   Glucose, UA NEGATIVE NEGATIVE mg/dL   Hgb urine dipstick NEGATIVE NEGATIVE   Bilirubin Urine NEGATIVE NEGATIVE   Ketones, ur NEGATIVE NEGATIVE mg/dL   Protein, ur NEGATIVE NEGATIVE mg/dL   Nitrite NEGATIVE NEGATIVE   Leukocytes,Ua NEGATIVE NEGATIVE    Comment: Performed at [facility] [address], [place], [place] [address]  Type and screen [place]     Status: None   Collection Time: 07/05/22  2:00 PM  Result Value Ref Range   ABO/RH(D) O POS    Antibody Screen NEG    Sample Expiration      07/08/2022,2359 Performed at [facility], [place] [address] [place], [place] [address]   Lactic acid, plasma     Status: Abnormal   Collection Time: 07/05/22  2:47 PM  Result Value Ref Range   Lactic Acid, Venous 3.7 (HH) 0.5 - 1.9 mmol/L    Comment: CRITICAL RESULT CALLED TO, READ BACK BY AND VERIFIED WITH J,NICKERSON RN '@1553'$  07/05/22 E,BENTON Performed at [facility], [place] [address] [place], [place] [address]     CT Angio Chest/Abd/Pel for Dissection W and/or Wo Contrast  Result Date: 07/05/2022 CLINICAL DATA:  Acute aortic syndrome. EXAM: CT ANGIOGRAPHY CHEST, ABDOMEN AND PELVIS TECHNIQUE: Non-contrast CT of the chest was initially obtained. Multidetector CT imaging through the chest, abdomen and pelvis was performed using the standard protocol during bolus administration of  intravenous contrast. Multiplanar reconstructed  images and MIPs were obtained and reviewed to evaluate the vascular anatomy. RADIATION DOSE REDUCTION: This exam was performed according to the departmental dose-optimization program which includes automated exposure control, adjustment of the mA and/or kV according to patient size and/or use of iterative reconstruction technique. CONTRAST:  23m OMNIPAQUE IOHEXOL 350 MG/ML SOLN COMPARISON:  May 16, 2020. FINDINGS: CTA CHEST FINDINGS Cardiovascular: Preferential opacification of the thoracic aorta. No evidence of thoracic aortic aneurysm or dissection. Normal heart size. No pericardial effusion. Mediastinum/Nodes: Moderate size sliding-type hiatal hernia. Thyroid gland is unremarkable. No adenopathy is noted. Lungs/Pleura: No pneumothorax or pleural effusion is noted. Mild bibasilar subsegmental atelectasis is noted. Musculoskeletal: No chest wall abnormality. No acute or significant osseous findings. Review of the MIP images confirms the above findings. CTA ABDOMEN AND PELVIS FINDINGS VASCULAR Aorta: Normal caliber aorta without aneurysm, dissection, vasculitis or significant stenosis. Celiac: Patent without evidence of aneurysm, dissection, vasculitis or significant stenosis. SMA: Patent without evidence of aneurysm, dissection, vasculitis or significant stenosis. Renals: Both renal arteries are patent without evidence of aneurysm, dissection, vasculitis, fibromuscular dysplasia or significant stenosis. IMA: Patent without evidence of aneurysm, dissection, vasculitis or significant stenosis. Inflow: Patent without evidence of aneurysm, dissection, vasculitis or significant stenosis. Veins: No obvious venous abnormality within the limitations of this arterial phase study. Review of the MIP images confirms the above findings. NON-VASCULAR Hepatobiliary: No focal liver abnormality is seen. No gallstones, gallbladder wall thickening, or biliary dilatation. Pancreas: Unremarkable. No pancreatic ductal dilatation or  surrounding inflammatory changes. Spleen: Normal in size without focal abnormality. Adrenals/Urinary Tract: Adrenal glands are unremarkable. Kidneys are normal, without renal calculi, focal lesion, or hydronephrosis. Bladder is unremarkable. Stomach/Bowel: The stomach is unremarkable. There is no small bowel dilatation. The colon appears to be moderately to severely dilated and full of stool. There appears to be some degree of wall thickening involving the sigmoid colon suggesting some degree of infectious or inflammatory colitis. Lymphatic: No adenopathy is noted. Reproductive: Status post hysterectomy. No adnexal masses. Other: No abdominal wall hernia or abnormality. No abdominopelvic ascites. Musculoskeletal: No acute or significant osseous findings. Review of the MIP images confirms the above findings. IMPRESSION: No evidence of thoracic or abdominal aortic dissection or aneurysm. Moderate to severe dilatation of the colon is noted with large amount of stool seen throughout the colon. There appears to be some degree of wall thickening involving the sigmoid colon suggesting infectious or inflammatory colitis. Moderate size sliding-type hiatal hernia. Mild bibasilar subsegmental atelectasis. Electronically Signed   By: JMarijo ConceptionM.D.   On: 07/05/2022 13:33   CT ANGIO HEAD NECK W WO CM (CODE STROKE)  Result Date: 07/05/2022 CLINICAL DATA:  Left-sided weakness. EXAM: CT ANGIOGRAPHY HEAD AND NECK TECHNIQUE: Multidetector CT imaging of the head and neck was performed using the standard protocol during bolus administration of intravenous contrast. Multiplanar CT image reconstructions and MIPs were obtained to evaluate the vascular anatomy. Carotid stenosis measurements (when applicable) are obtained utilizing NASCET criteria, using the distal internal carotid diameter as the denominator. RADIATION DOSE REDUCTION: This exam was performed according to the departmental dose-optimization program which includes  automated exposure control, adjustment of the mA and/or kV according to patient size and/or use of iterative reconstruction technique. CONTRAST:  789mOMNIPAQUE IOHEXOL 350 MG/ML SOLN COMPARISON:  Head and neck CTA 04/30/2022 FINDINGS: CTA NECK FINDINGS Aortic arch: Normal variant aortic arch branching pattern with common origin of the brachiocephalic and left common carotid arteries. Wide patency of the  brachiocephalic and subclavian arteries. Right carotid system: Patent without evidence of a stenosis, dissection, or significant atherosclerosis. Unchanged beading of the distal cervical ICA. Left carotid system: Patent with minimal atheromatous plaque noted in the carotid bulb. No evidence of a significant stenosis or dissection. Unchanged beading of the distal cervical ICA. Vertebral arteries: Patent without evidence of a stenosis, dissection, or significant atherosclerosis. Strongly dominant right vertebral artery. Unchanged 4 mm inferiorly directed outpouching from the right V3 segment at the C1 level. Skeleton: Advanced disc degeneration at C5-6. Interbody ankylosis at C6-7. Advanced facet arthrosis with ankylosis at C4-5. 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 No evidence of cervical lymphadenopathy. Upper chest: No apical lung consolidation or mass. Review of the MIP images confirms the above findings CTA HEAD FINDINGS Anterior circulation: The internal carotid arteries are widely patent from skull base to carotid termini. ACAs and MCAs are patent without evidence of a proximal branch occlusion or significant proximal stenosis. No aneurysm is identified. Posterior circulation: The intracranial right vertebral artery is widely patent and supplies the basilar. The left vertebral artery is diminutive and poorly visualized distal to the PICA origin. Patent bilateral PICA, right AICA, and bilateral SCA origins are visualized. The basilar artery is widely patent. There are small  right and large left posterior communicating arteries with hypoplasia of the left P1 segment. Both PCAs are patent without evidence of a significant proximal stenosis. No aneurysm is identified. Venous sinuses: Poorly evaluated due to arterial contrast timing. Anatomic variants: Predominantly fetal type origin of the left PCA. Review of the MIP images confirms the above findings These results were communicated to Dr. Leonel Ramsay at [place] am on 07/05/2022 by text page via the [facility] messaging system. IMPRESSION: 1. Minimal atherosclerosis without an emergent large vessel occlusion or significant proximal stenosis in the head or neck. 2. 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 3. 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 Electronically Signed   By: Logan Bores M.D.   On: 07/05/2022 11:38   CT HEAD CODE STROKE WO CONTRAST  Result Date: 07/05/2022 CLINICAL DATA:  Code stroke.  Left-sided weakness. EXAM: CT HEAD WITHOUT CONTRAST TECHNIQUE: Contiguous axial images were obtained from the base of the skull through the vertex without intravenous contrast. RADIATION DOSE REDUCTION: This exam was performed according to the departmental dose-optimization program which includes automated exposure control, adjustment of the mA and/or kV according to patient size and/or use of iterative reconstruction technique. COMPARISON:  Head CT and MRI 04/30/2022 FINDINGS: Brain: There is no evidence of an acute infarct, intracranial hemorrhage, mass, midline shift, or extra-axial fluid collection. The ventricles are normal in size. Hypodensities in the cerebral Alaney Witter matter bilaterally are unchanged and nonspecific but compatible with mild chronic small vessel ischemic disease. Vascular: Calcified atherosclerosis at the skull base. No hyperdense vessel. Skull: No fracture or suspicious osseous lesion. Small bilateral parietal skull osteomas.  Sinuses/Orbits: Bilateral cataract extraction. Visualized paranasal sinuses and mastoid air cells are clear. Other: None. ASPECTS [facility] Stroke Program Early CT Score) - Ganglionic level infarction (caudate, lentiform nuclei, internal capsule, insula, M1-M3 cortex): 7 - Supraganglionic infarction (M4-M6 cortex): 3 Total score (0-10 with 10 being normal): 10 Preliminary findings were communicated by telephone to Dr. Leonel Ramsay on 07/05/2022 at 11:01 a.m. IMPRESSION: 1. No evidence of acute intracranial abnormality. ASPECTS of 10. 2. Mild chronic small vessel ischemic disease. Electronically Signed   By: Seymour Bars.D.  On: 07/05/2022 11:21      A/P: [name] is an 81 y.o. female with HTN, HLD, GERD, chronic constipation now with acute abdomen, worsening shock and stercoral type findings on CT with abnormal thickening of her sigmoid, significant cecal dilation  -Given her clinical deterioration without any other source, we are concerned for either stercoral perforation of her distal colon vs perforation of her cecum. I discussed her case with Dr. Chase Caller whom is also concerned her septic shock type picture is originating from her abdomen. We have therefore discussed proceeding emergently with exploratory laparotomy  -The anatomy and physiology of the GI tract was discussed at length with the patient and her daughter. We have discussed exploratory laparotomy, possible segmental colectomy, possible total abdominal colectomy, possible ostomy vs temporary abdominal closure device  -Discussed possibility of open abdomen and need for further surgeries over the next 2-3 days based on clinical status. Also discussed ostomy, expectations, possible permanence of this  -The planned procedures, material risks (including, but not limited to, pain, bleeding, infection, scarring, need for blood transfusion, damage to surrounding structures- blood vessels/nerves/viscus/organs, damage to ureter, urine  leak, need for additional procedures, need for stoma which may be permanent, worsening of pre-existing medical conditions, hernia, recurrence, pneumonia, heart attack, stroke, death) benefits and alternatives to surgery were discussed at length. The patient's and her daughter's questions were answered to their satisfaction, they voiced understanding and elected to proceed with surgery. Additionally, we discussed typical postoperative expectations and the recovery process.  I spent a total of 80 minutes in both face-to-face and non-face-to-face activities, excluding procedures performed, for this visit on the date of this encounter.      Nadeen Landau, [facility], [place]

## 2022-07-05 NOTE — Anesthesia Procedure Notes (Signed)
Arterial Line Insertion Start/End11/24/2023 6:00 PM, 07/05/2022 6:04 PM Performed by: Audry Pili, MD, anesthesiologist  Patient location: OR. Preanesthetic checklist: patient identified, IV checked, monitors and equipment checked, pre-op evaluation and timeout performed Emergency situation Right, radial was placed Catheter size: 20 G Hand hygiene performed   Attempts: 2 Procedure performed using ultrasound guided technique. Ultrasound Notes:anatomy identified, needle tip was noted to be adjacent to the nerve/plexus identified, no ultrasound evidence of intravascular and/or intraneural injection and image(s) printed for medical record Following insertion, dressing applied and Biopatch. Post procedure assessment: unchanged and normal  Patient tolerated the procedure well with no immediate complications.

## 2022-07-05 NOTE — Anesthesia Procedure Notes (Signed)
Procedure Name: Intubation Date/Time: 07/05/2022 6:01 PM  Performed by: Georgia Duff, CRNAPre-anesthesia Checklist: Patient identified, Emergency Drugs available, Suction available and Patient being monitored Patient Re-evaluated:Patient Re-evaluated prior to induction Oxygen Delivery Method: Circle System Utilized Preoxygenation: Pre-oxygenation with 100% oxygen Induction Type: IV induction Ventilation: Mask ventilation without difficulty Laryngoscope Size: Miller and 2 Grade View: Grade I Tube type: Oral Tube size: 7.0 mm Number of attempts: 1 Airway Equipment and Method: Stylet and Oral airway Placement Confirmation: ETT inserted through vocal cords under direct vision, positive ETCO2 and breath sounds checked- equal and bilateral Secured at: 21 cm Tube secured with: Tape Dental Injury: Teeth and Oropharynx as per pre-operative assessment

## 2022-07-05 NOTE — H&P (Signed)
NAME:  [name], MRN:  [id number], DOB:  [birth year], LOS: 0 ADMISSION DATE:  07/05/2022, CONSULTATION DATE:  07/05/22 REFERRING MD:  Truett Mainland CHIEF COMPLAINT:  Abd pain   History of Present Illness:  [name] is a 81 y.o. female who has a PMH as below. She presented to [facility] ED 11/24 with abdominal pain. She has chronic constipation and goes 3 - 4 days between bowel movements. She was sitting on commode morning of 11/24 attempting to have BM when she had worsening pain and near syncopal episode. She began to moan and yell for help and daughter then found her slumped over on the floor. She was awake but was drooling; therefore, a code stroke was activated en route to ED.  In ED, she was found to be hypotensive with SBP in 70s. She was given 2L IVF and had some response, though, she remained hypotensive. She was subsequently started on Levophed. CT and CTA head was negative. CTA chest/abdomen/pelvis was negative for dissection or aneurysm; however, showed moderate to severe dilatation of the colon is noted with large amount of stool seen throughout the colo along with some wall thickening involving the sigmoid colon suggesting infectious or inflammatory colitis.  Pertinent  Medical History:  has Fibromyalgia; Memory loss or impairment; Essential hypertension; Routine health maintenance; Insomnia; Bursitis of right shoulder; Cervical disc disorder with radiculopathy of cervical region; Chronic low back pain; Abnormal CT scan; Hypothyroidism; Swelling; Anxiety; Acute respiratory failure with hypoxia ([place]); Right upper lobe pneumonia; Hyponatremia; GERD (gastroesophageal reflux disease); Anxiety with depression; Aphasic agraphia; TIA (transient ischemic attack); Atrioventricular block, Mobitz type 1, Wenckebach; Dizziness; Prolonged Q-T interval on ECG; and Sepsis ([place]) on their problem list.  Significant Hospital Events: Including procedures, antibiotic start and stop dates in addition to other  pertinent events   11/24 > admit.  Interim History / Subjective:  Has mild abd pain, mainly with palpation. BP improving gradually.  Objective:  Blood pressure 129/74, pulse 84, temperature 97.8 F (36.6 C), temperature source Oral, resp. rate (!) 22, weight 68.8 kg, SpO2 99 %.        Intake/Output Summary (Last 24 hours) at 07/05/2022 1427 Last data filed at 07/05/2022 1243 Gross per 24 hour  Intake 1000 ml  Output --  Net 1000 ml   Filed Weights   07/05/22 1000  Weight: 68.8 kg    Examination: General: Adult female, resting in bed, in NAD. Neuro: A&O x 3, no deficits. HEENT: [place]/AT. Sclerae anicteric. EOMI. MM dry. Cardiovascular: RRR, no M/R/G.  Lungs: Respirations even and unlabored.  CTA bilaterally, No W/R/R. Abdomen: BS x 4, soft, NT/ND.  Musculoskeletal: No gross deformities, no edema.  Skin: Intact, warm, no rashes.  Labs/imaging personally reviewed:  CT/CTA head 11/24 > negative. CTA chest/abd/pelv 11/24 > negative for dissection or aneurysm; however, showed moderate to severe dilatation of the colon is noted with large amount of stool seen throughout the colo along with some wall thickening involving the sigmoid colon suggesting infectious or inflammatory colitis.  Assessment & Plan:   Hypotension with shock physiology - presumed multifactorial in setting hypovolemia and possibly septic from infectious colitis. - Continue fluids. - Trend lactate. - Continue Levophed as needed but wean to off as able. - Continue empiric Flagyl. - Follow cultures.  Constipation - acute on chronic. - PRN laxatives.  Hx HTN, PVD. - Continue home ASA.  - Hold home Lisinopril, Toprol-XL, Rosuvastatin.  Hx hypothyroidism. - Continue Synthroid.  Best practice (evaluated daily):  Diet/type:  clear liquids DVT prophylaxis: prophylactic heparin  GI prophylaxis: N/A Lines: N/A Foley:  N/A Code Status:  full code Last date of multidisciplinary goals of care discussion:  Updated daughter at bedside with RN present. DNR confirmed, OK with short term vasopressors.  Labs   CBC: Recent Labs  Lab 07/05/22 1055 07/05/22 1058  WBC 5.9  --   NEUTROABS 1.9  --   HGB 16.3* 17.3*  HCT 49.8* 51.0*  MCV 93.6  --   PLT 300  --     Basic Metabolic Panel: Recent Labs  Lab 07/05/22 1055 07/05/22 1058  NA 138 137  K 4.6 4.6  CL 103 105  CO2 21*  --   GLUCOSE 139* 139*  BUN 20 22  CREATININE 0.96 0.70  CALCIUM 9.7  --    GFR: Estimated Creatinine Clearance: 50.2 mL/min (by C-G formula based on SCr of 0.7 mg/dL). Recent Labs  Lab 07/05/22 1055  WBC 5.9    Liver Function Tests: Recent Labs  Lab 07/05/22 1055  AST 48*  ALT 36  ALKPHOS 166*  BILITOT 0.5  PROT 6.9  ALBUMIN 3.9   No results for input(s): "LIPASE", "AMYLASE" in the last 168 hours. No results for input(s): "AMMONIA" in the last 168 hours.  ABG    Component Value Date/Time   TCO2 20 (L) 07/05/2022 1058     Coagulation Profile: Recent Labs  Lab 07/05/22 1055  INR 1.0    Cardiac Enzymes: No results for input(s): "CKTOTAL", "CKMB", "CKMBINDEX", "TROPONINI" in the last 168 hours.  HbA1C: Hgb A1c MFr Bld  Date/Time Value Ref Range Status  04/30/2022 09:39 PM 5.7 (H) 4.8 - 5.6 % Final    Comment:    (NOTE) Pre diabetes:          5.7%-6.4%  Diabetes:              >6.4%  Glycemic control for   <7.0% adults with diabetes   05/04/2009 01:22 PM 5.8 4.6 - 6.5 % Final    Comment:    See lab report for associated comment(s)    CBG: Recent Labs  Lab 07/05/22 1051  GLUCAP 136*    Review of Systems:   All negative; except for those that are bolded, which indicate positives.  Constitutional: weight loss, weight gain, night sweats, fevers, chills, fatigue, weakness.  HEENT: headaches, sore throat, sneezing, nasal congestion, post nasal drip, difficulty swallowing, tooth/dental problems, visual complaints, visual changes, ear aches. Neuro: difficulty with speech,  weakness, numbness, ataxia. CV:  chest pain, orthopnea, PND, swelling in lower extremities, dizziness, palpitations, syncope.  Resp: cough, hemoptysis, dyspnea, wheezing. GI: heartburn, indigestion, abdominal pain, nausea, vomiting, diarrhea, constipation (chronic), change in bowel habits, loss of appetite, hematemesis, melena, hematochezia.  GU: dysuria, change in color of urine, urgency or frequency, flank pain, hematuria. MSK: joint pain or swelling, decreased range of motion. Psych: change in mood or affect, depression, anxiety, suicidal ideations, homicidal ideations. Skin: rash, itching, bruising.   Past Medical History:  She,  has a past medical history of Cancer ([place]) (2014), Esophageal stricture, Fibromyalgia, GERD (gastroesophageal reflux disease), Helicobacter pylori gastritis, Hypertension, Intention tremor, Lumbago, Memory loss, Osteoarthritis of spine, Pneumonia, PUD (peptic ulcer disease), and PVD (peripheral vascular disease) ([place]).   Surgical History:   Past Surgical History:  Procedure Laterality Date   [place]   CATARACT EXTRACTION Bilateral 2014   [facility]   LAPAROSCOPIC PARTIAL NEPHRECTOMY Left April '13  [facility], Dr. Alinda Money   LUMBAR FUSION  03/2003   L4-5   [place]     Social History:   reports that she has never smoked. She has never used smokeless tobacco. She reports current alcohol use of about 7.0 standard drinks of alcohol per week. She reports that she does not use drugs.   Family History:  Her family history includes COPD in her mother; Crohn's disease in an other family member; Diabetes in her daughter, father, and son; Hearing loss in her brother; Heart disease in her brother, father, and mother; Hyperlipidemia in her brother; Hypertension in her son; Melanoma in her son; Parkinson's disease in her father; Stroke in her brother and father; Thyroid disease in her  mother. There is no history of Colon cancer, Stomach cancer, or Esophageal cancer.   Allergies Allergies  Allergen Reactions   Morphine And Related Itching and Rash    Given in IV.     Home Medications  Prior to Admission medications   Medication Sig Start Date End Date Taking? Authorizing Provider  ALPRAZolam Duanne Moron) 0.5 MG tablet Take 1 tablet (0.5 mg total) by mouth 2 (two) times daily as needed. for anxiety Patient taking differently: Take 1 mg by mouth at bedtime. 11/14/16   Plotnikov, Evie Lacks, MD  amitriptyline (ELAVIL) 25 MG tablet TAKE 1 TABLET BY MOUTH EVERY DAY AT BEDTIME Patient taking differently: Take 25 mg by mouth at bedtime. 10/23/17   Hoyt Koch, MD  Artificial Tear Ointment (DRY EYES OP) Apply 1 drop to eye 3 (three) times daily as needed (for dry eyes).    [provider]  aspirin EC 81 MG tablet Take 1 tablet (81 mg total) by mouth daily. Swallow whole. 05/02/22   Regalado, Belkys A, MD  buPROPion (WELLBUTRIN) 75 MG tablet Take 75 mg by mouth every morning. 02/28/20   [provider]  calcium-vitamin D (OSCAL WITH D) 500-200 MG-UNIT per tablet Take 1 tablet by mouth daily.    [provider]  Collagen Hydrolysate, Bovine, POWD Take 1 Scoop by mouth daily.    [provider]  donepezil (ARICEPT) 5 MG tablet TAKE 1 TABLET(5 MG) BY MOUTH AT BEDTIME Patient not taking: Reported on 06/27/2022 06/06/22   Garvin Fila, MD  DULoxetine (CYMBALTA) 30 MG capsule Take 30 mg by mouth See admin instructions. Take 1 capsule (30 mg) combine with 1 capsule (60 mg) by mouth daily    [provider]  fluconazole (DIFLUCAN) 150 MG tablet Take 1 tablet (150 mg total) by mouth every 3 (three) days. 06/17/22   Jaynee Eagles, PA-C  levothyroxine (SYNTHROID) 88 MCG tablet Take 88 mcg by mouth every morning. 04/01/22   [provider]  lisinopril (ZESTRIL) 20 MG tablet Take 1 tablet (20 mg total) by mouth daily. 06/27/22 09/25/22   Custovic, Collene Mares, DO  meloxicam (MOBIC) 7.5 MG tablet Take 7.5 mg by mouth daily.  03/25/18   [provider]  methylcellulose (CITRUCEL) oral powder 2 tablespoons daily 03/08/21   Irene Shipper, MD  metoprolol succinate (TOPROL-XL) 50 MG 24 hr tablet Take 1 tablet (50 mg total) by mouth daily. Take with or immediately following a meal. NEED ANNUAL VISIT FOR FURTHER REFILLS Patient not taking: Reported on 06/27/2022 04/29/17   Hoyt Koch, MD  Multiple Vitamins-Minerals (MULTIVITAMIN WITH MINERALS) tablet Take 1 tablet by mouth daily.    [provider]  omeprazole (PRILOSEC) 40 MG capsule Take 1  capsule (40 mg total) by mouth daily. NEED ANNUAL VISIT FOR FURTHER REFILLS Patient taking differently: Take 40 mg by mouth daily. 04/29/17   Hoyt Koch, MD  PREMARIN vaginal cream Place 1 Application vaginally 2 (two) times a week. 02/11/22   [provider]  rosuvastatin (CRESTOR) 10 MG tablet Take 1 tablet (10 mg total) by mouth daily. 05/01/22 05/01/23  Regalado, Belkys A, MD  Sennosides (SENNA LAX PO) Take 1 tablet by mouth daily.    [provider]  traMADol (ULTRAM) 50 MG tablet TAKE 1 TABLET BY MOUTH EVERY 8 HOURS AS NEEDED FOR MODERATE TO SEVERE PAIN. MAY TAKE 1 ADDITIONAL TABLET DAILY AS NEEDED FOR SEVERE PAIN Patient taking differently: Take 50 mg by mouth every 8 (eight) hours as needed for moderate pain or severe pain. May take 1 additional tablet daily as needed for severe pain 10/24/16   Hoyt Koch, MD     Critical care time: 40 min.   Montey Hora, [facility] For pager details, please see AMION or use Epic chat  After 1900, please call [facility] for cross coverage needs 07/05/2022, 2:27 PM

## 2022-07-05 NOTE — ED Notes (Signed)
Pt transported to CT via stretcher at this time.  

## 2022-07-06 ENCOUNTER — Inpatient Hospital Stay (HOSPITAL_COMMUNITY): Payer: Medicare Other

## 2022-07-06 ENCOUNTER — Encounter (HOSPITAL_COMMUNITY): Payer: Self-pay | Admitting: Surgery

## 2022-07-06 DIAGNOSIS — A419 Sepsis, unspecified organism: Secondary | ICD-10-CM | POA: Diagnosis not present

## 2022-07-06 DIAGNOSIS — R6521 Severe sepsis with septic shock: Secondary | ICD-10-CM | POA: Diagnosis not present

## 2022-07-06 LAB — CBC
HCT: 35.2 % — ABNORMAL LOW (ref 36.0–46.0)
HCT: 44.1 % (ref 36.0–46.0)
HCT: 45 % (ref 36.0–46.0)
Hemoglobin: 11.6 g/dL — ABNORMAL LOW (ref 12.0–15.0)
Hemoglobin: 14 g/dL (ref 12.0–15.0)
Hemoglobin: 14.5 g/dL (ref 12.0–15.0)
MCH: 30.8 pg (ref 26.0–34.0)
MCH: 31 pg (ref 26.0–34.0)
MCH: 31 pg (ref 26.0–34.0)
MCHC: 31.7 g/dL (ref 30.0–36.0)
MCHC: 32.2 g/dL (ref 30.0–36.0)
MCHC: 33 g/dL (ref 30.0–36.0)
MCV: 94.1 fL (ref 80.0–100.0)
MCV: 96.2 fL (ref 80.0–100.0)
MCV: 97.1 fL (ref 80.0–100.0)
Platelets: 200 10*3/uL (ref 150–400)
Platelets: 231 10*3/uL (ref 150–400)
Platelets: 237 10*3/uL (ref 150–400)
RBC: 3.74 MIL/uL — ABNORMAL LOW (ref 3.87–5.11)
RBC: 4.54 MIL/uL (ref 3.87–5.11)
RBC: 4.68 MIL/uL (ref 3.87–5.11)
RDW: 13.7 % (ref 11.5–15.5)
RDW: 13.7 % (ref 11.5–15.5)
RDW: 13.9 % (ref 11.5–15.5)
WBC: 4.5 10*3/uL (ref 4.0–10.5)
WBC: 6.1 10*3/uL (ref 4.0–10.5)
WBC: 7.7 10*3/uL (ref 4.0–10.5)
nRBC: 0 % (ref 0.0–0.2)
nRBC: 0 % (ref 0.0–0.2)
nRBC: 0 % (ref 0.0–0.2)

## 2022-07-06 LAB — POCT I-STAT 7, (LYTES, BLD GAS, ICA,H+H)
Acid-base deficit: 12 mmol/L — ABNORMAL HIGH (ref 0.0–2.0)
Acid-base deficit: 11 mmol/L — ABNORMAL HIGH (ref 0.0–2.0)
Acid-base deficit: 3 mmol/L — ABNORMAL HIGH (ref 0.0–2.0)
Bicarbonate: 17.3 mmol/L — ABNORMAL LOW (ref 20.0–28.0)
Bicarbonate: 15.3 mmol/L — ABNORMAL LOW (ref 20.0–28.0)
Bicarbonate: 21 mmol/L (ref 20.0–28.0)
Calcium, Ion: 1.19 mmol/L (ref 1.15–1.40)
Calcium, Ion: 1.05 mmol/L — ABNORMAL LOW (ref 1.15–1.40)
Calcium, Ion: 1.12 mmol/L — ABNORMAL LOW (ref 1.15–1.40)
HCT: 40 % (ref 36.0–46.0)
HCT: 36 % (ref 36.0–46.0)
HCT: 34 % — ABNORMAL LOW (ref 36.0–46.0)
Hemoglobin: 13.6 g/dL (ref 12.0–15.0)
Hemoglobin: 12.2 g/dL (ref 12.0–15.0)
Hemoglobin: 11.6 g/dL — ABNORMAL LOW (ref 12.0–15.0)
O2 Saturation: 96 %
O2 Saturation: 92 %
O2 Saturation: 92 %
Patient temperature: 37.7
Potassium: 4.9 mmol/L (ref 3.5–5.1)
Potassium: 3.3 mmol/L — ABNORMAL LOW (ref 3.5–5.1)
Potassium: 3.3 mmol/L — ABNORMAL LOW (ref 3.5–5.1)
Sodium: 138 mmol/L (ref 135–145)
Sodium: 139 mmol/L (ref 135–145)
Sodium: 138 mmol/L (ref 135–145)
TCO2: 19 mmol/L — ABNORMAL LOW (ref 22–32)
TCO2: 16 mmol/L — ABNORMAL LOW (ref 22–32)
TCO2: 22 mmol/L (ref 22–32)
pCO2 arterial: 51.3 mmHg — ABNORMAL HIGH (ref 32–48)
pCO2 arterial: 34.4 mmHg (ref 32–48)
pCO2 arterial: 34.9 mmHg (ref 32–48)
pH, Arterial: 7.137 — CL (ref 7.35–7.45)
pH, Arterial: 7.26 — ABNORMAL LOW (ref 7.35–7.45)
pH, Arterial: 7.388 (ref 7.35–7.45)
pO2, Arterial: 110 mmHg — ABNORMAL HIGH (ref 83–108)
pO2, Arterial: 74 mmHg — ABNORMAL LOW (ref 83–108)
pO2, Arterial: 64 mmHg — ABNORMAL LOW (ref 83–108)

## 2022-07-06 LAB — URINE CULTURE: Culture: NO GROWTH

## 2022-07-06 LAB — CK TOTAL AND CKMB (NOT AT ARMC)
CK, MB: 6.7 ng/mL — ABNORMAL HIGH (ref 0.5–5.0)
Relative Index: INVALID (ref 0.0–2.5)
Total CK: 91 U/L (ref 38–234)

## 2022-07-06 LAB — COMPREHENSIVE METABOLIC PANEL
ALT: 35 U/L (ref 0–44)
ALT: 32 U/L (ref 0–44)
AST: 55 U/L — ABNORMAL HIGH (ref 15–41)
AST: 50 U/L — ABNORMAL HIGH (ref 15–41)
Albumin: 2.3 g/dL — ABNORMAL LOW (ref 3.5–5.0)
Albumin: 2.8 g/dL — ABNORMAL LOW (ref 3.5–5.0)
Alkaline Phosphatase: 218 U/L — ABNORMAL HIGH (ref 38–126)
Alkaline Phosphatase: 186 U/L — ABNORMAL HIGH (ref 38–126)
Anion gap: 11 (ref 5–15)
Anion gap: 13 (ref 5–15)
BUN: 28 mg/dL — ABNORMAL HIGH (ref 8–23)
BUN: 34 mg/dL — ABNORMAL HIGH (ref 8–23)
CO2: 17 mmol/L — ABNORMAL LOW (ref 22–32)
CO2: 23 mmol/L (ref 22–32)
Calcium: 7.3 mg/dL — ABNORMAL LOW (ref 8.9–10.3)
Calcium: 8 mg/dL — ABNORMAL LOW (ref 8.9–10.3)
Chloride: 110 mmol/L (ref 98–111)
Chloride: 102 mmol/L (ref 98–111)
Creatinine, Ser: 1.1 mg/dL — ABNORMAL HIGH (ref 0.44–1.00)
Creatinine, Ser: 1.28 mg/dL — ABNORMAL HIGH (ref 0.44–1.00)
GFR, Estimated: 50 mL/min — ABNORMAL LOW (ref >60)
GFR, Estimated: 42 mL/min — ABNORMAL LOW (ref >60)
Glucose, Bld: 299 mg/dL — ABNORMAL HIGH (ref 70–99)
Glucose, Bld: 197 mg/dL — ABNORMAL HIGH (ref 70–99)
Potassium: 3.9 mmol/L (ref 3.5–5.1)
Potassium: 3.3 mmol/L — ABNORMAL LOW (ref 3.5–5.1)
Sodium: 138 mmol/L (ref 135–145)
Sodium: 138 mmol/L (ref 135–145)
Total Bilirubin: 0.6 mg/dL (ref 0.3–1.2)
Total Bilirubin: 0.6 mg/dL (ref 0.3–1.2)
Total Protein: 4.4 g/dL — ABNORMAL LOW (ref 6.5–8.1)
Total Protein: 4.6 g/dL — ABNORMAL LOW (ref 6.5–8.1)

## 2022-07-06 LAB — GLUCOSE, CAPILLARY
Glucose-Capillary: 305 mg/dL — ABNORMAL HIGH (ref 70–99)
Glucose-Capillary: 222 mg/dL — ABNORMAL HIGH (ref 70–99)
Glucose-Capillary: 146 mg/dL — ABNORMAL HIGH (ref 70–99)
Glucose-Capillary: 171 mg/dL — ABNORMAL HIGH (ref 70–99)
Glucose-Capillary: 134 mg/dL — ABNORMAL HIGH (ref 70–99)
Glucose-Capillary: 123 mg/dL — ABNORMAL HIGH (ref 70–99)

## 2022-07-06 LAB — BASIC METABOLIC PANEL
Anion gap: 15 (ref 5–15)
BUN: 31 mg/dL — ABNORMAL HIGH (ref 8–23)
CO2: 16 mmol/L — ABNORMAL LOW (ref 22–32)
Calcium: 7.2 mg/dL — ABNORMAL LOW (ref 8.9–10.3)
Chloride: 106 mmol/L (ref 98–111)
Creatinine, Ser: 1.33 mg/dL — ABNORMAL HIGH (ref 0.44–1.00)
GFR, Estimated: 40 mL/min — ABNORMAL LOW (ref >60)
Glucose, Bld: 313 mg/dL — ABNORMAL HIGH (ref 70–99)
Potassium: 3.4 mmol/L — ABNORMAL LOW (ref 3.5–5.1)
Sodium: 137 mmol/L (ref 135–145)

## 2022-07-06 LAB — TROPONIN I (HIGH SENSITIVITY)
Troponin I (High Sensitivity): 66 ng/L — ABNORMAL HIGH (ref <18)
Troponin I (High Sensitivity): 110 ng/L (ref <18)

## 2022-07-06 LAB — DIC (DISSEMINATED INTRAVASCULAR COAGULATION)PANEL
D-Dimer, Quant: >20 ug/mL-FEU — ABNORMAL HIGH (ref 0.00–0.50)
Fibrinogen: 350 mg/dL (ref 210–475)
INR: 1.5 — ABNORMAL HIGH (ref 0.8–1.2)
Platelets: 200 10*3/uL (ref 150–400)
Prothrombin Time: 17.5 seconds — ABNORMAL HIGH (ref 11.4–15.2)
Smear Review: NONE SEEN
aPTT: 35 seconds (ref 24–36)

## 2022-07-06 LAB — PROTIME-INR
INR: 1.3 — ABNORMAL HIGH (ref 0.8–1.2)
Prothrombin Time: 15.6 seconds — ABNORMAL HIGH (ref 11.4–15.2)

## 2022-07-06 LAB — URINALYSIS, ROUTINE W REFLEX MICROSCOPIC
Bilirubin Urine: NEGATIVE
Glucose, UA: NEGATIVE mg/dL
Ketones, ur: NEGATIVE mg/dL
Leukocytes,Ua: NEGATIVE
Nitrite: NEGATIVE
Protein, ur: NEGATIVE mg/dL
Specific Gravity, Urine: 1.02 (ref 1.005–1.030)
pH: 5 (ref 5.0–8.0)

## 2022-07-06 LAB — HEMOGLOBIN AND HEMATOCRIT, BLOOD
HCT: 33.8 % — ABNORMAL LOW (ref 36.0–46.0)
Hemoglobin: 11 g/dL — ABNORMAL LOW (ref 12.0–15.0)

## 2022-07-06 LAB — LACTIC ACID, PLASMA
Lactic Acid, Venous: 4 mmol/L (ref 0.5–1.9)
Lactic Acid, Venous: 3.1 mmol/L (ref 0.5–1.9)
Lactic Acid, Venous: 2.5 mmol/L (ref 0.5–1.9)

## 2022-07-06 LAB — CORTISOL: Cortisol, Plasma: 47.8 ug/dL

## 2022-07-06 LAB — MAGNESIUM: Magnesium: 2.5 mg/dL — ABNORMAL HIGH (ref 1.7–2.4)

## 2022-07-06 LAB — TRIGLYCERIDES: Triglycerides: 93 mg/dL (ref <150)

## 2022-07-06 LAB — PHOSPHORUS: Phosphorus: 3.8 mg/dL (ref 2.5–4.6)

## 2022-07-06 LAB — LIPASE, BLOOD: Lipase: 28 U/L (ref 11–51)

## 2022-07-06 MED ORDER — SODIUM BICARBONATE 8.4 % IV SOLN
100.0000 meq | Freq: Once | INTRAVENOUS | Status: AC
  Administered 2022-07-06: 100 meq via INTRAVENOUS
  Filled 2022-07-06: qty 100

## 2022-07-06 MED ORDER — LACTULOSE 10 GM/15ML PO SOLN
30.0000 g | Freq: Three times a day (TID) | ORAL | Status: DC
  Administered 2022-07-06 – 2022-07-09 (×9): 30 g
  Filled 2022-07-06 (×9): qty 45

## 2022-07-06 MED ORDER — MIDAZOLAM HCL 2 MG/2ML IJ SOLN: 1.0000 mg | INTRAMUSCULAR | Status: DC | PRN

## 2022-07-06 MED ORDER — INSULIN ASPART 100 UNIT/ML IJ SOLN
0.0000 [IU] | INTRAMUSCULAR | Status: DC
  Administered 2022-07-06: 7 [IU] via SUBCUTANEOUS
  Administered 2022-07-06: 3 [IU] via SUBCUTANEOUS

## 2022-07-06 MED ORDER — POTASSIUM CHLORIDE 10 MEQ/100ML IV SOLN: 10.0000 meq | Freq: Once | INTRAVENOUS | Status: DC

## 2022-07-06 MED ORDER — ACETAMINOPHEN 10 MG/ML IV SOLN
1000.0000 mg | Freq: Once | INTRAVENOUS | Status: AC
  Administered 2022-07-06: 1000 mg via INTRAVENOUS
  Filled 2022-07-06: qty 100

## 2022-07-06 MED ORDER — ASPIRIN 81 MG PO CHEW
81.0000 mg | CHEWABLE_TABLET | Freq: Every day | ORAL | Status: DC
  Administered 2022-07-07 – 2022-07-10 (×4): 81 mg
  Filled 2022-07-06 (×4): qty 1

## 2022-07-06 MED ORDER — PANTOPRAZOLE SODIUM 40 MG IV SOLR
40.0000 mg | Freq: Two times a day (BID) | INTRAVENOUS | Status: DC
  Administered 2022-07-06 – 2022-07-09 (×7): 40 mg via INTRAVENOUS
  Filled 2022-07-06 (×7): qty 10

## 2022-07-06 MED ORDER — INSULIN ASPART 100 UNIT/ML IJ SOLN
0.0000 [IU] | INTRAMUSCULAR | Status: DC
  Administered 2022-07-06: 2 [IU] via SUBCUTANEOUS
  Administered 2022-07-06: 3 [IU] via SUBCUTANEOUS
  Administered 2022-07-06 – 2022-07-10 (×15): 2 [IU] via SUBCUTANEOUS
  Administered 2022-07-10 – 2022-07-11 (×3): 3 [IU] via SUBCUTANEOUS
  Administered 2022-07-11 – 2022-07-13 (×8): 2 [IU] via SUBCUTANEOUS
  Administered 2022-07-14: 3 [IU] via SUBCUTANEOUS
  Administered 2022-07-14 (×3): 2 [IU] via SUBCUTANEOUS
  Administered 2022-07-15: 3 [IU] via SUBCUTANEOUS
  Administered 2022-07-15: 2 [IU] via SUBCUTANEOUS

## 2022-07-06 MED ORDER — LACTATED RINGERS IV BOLUS
500.0000 mL | Freq: Once | INTRAVENOUS | Status: AC
  Administered 2022-07-06: 500 mL via INTRAVENOUS

## 2022-07-06 MED ORDER — PERFLUTREN LIPID MICROSPHERE
1.0000 mL | INTRAVENOUS | Status: AC | PRN
  Administered 2022-07-06: 2 mL via INTRAVENOUS

## 2022-07-06 MED ORDER — LEVOTHYROXINE SODIUM 88 MCG PO TABS
88.0000 ug | ORAL_TABLET | Freq: Every day | ORAL | Status: DC
  Administered 2022-07-06: 88 ug
  Filled 2022-07-06: qty 1

## 2022-07-06 MED ORDER — CALCIUM GLUCONATE-NACL 2-0.675 GM/100ML-% IV SOLN
2.0000 g | Freq: Once | INTRAVENOUS | Status: AC
  Administered 2022-07-06: 2000 mg via INTRAVENOUS
  Filled 2022-07-06: qty 100

## 2022-07-06 MED ORDER — NOREPINEPHRINE 4 MG/250ML-% IV SOLN
0.0000 ug/min | INTRAVENOUS | Status: DC
  Administered 2022-07-06 (×2): 15 ug/min via INTRAVENOUS
  Administered 2022-07-07: 16 ug/min via INTRAVENOUS
  Administered 2022-07-07: 14 ug/min via INTRAVENOUS
  Administered 2022-07-07: 13 ug/min via INTRAVENOUS
  Administered 2022-07-07: 15 ug/min via INTRAVENOUS
  Administered 2022-07-07: 14 ug/min via INTRAVENOUS
  Administered 2022-07-07: 13 ug/min via INTRAVENOUS
  Administered 2022-07-08: 4 ug/min via INTRAVENOUS
  Administered 2022-07-08: 8 ug/min via INTRAVENOUS
  Filled 2022-07-06 (×10): qty 250

## 2022-07-06 MED ORDER — LEVOTHYROXINE SODIUM 75 MCG PO TABS
75.0000 ug | ORAL_TABLET | Freq: Every day | ORAL | Status: DC
  Administered 2022-07-08 – 2022-07-16 (×9): 75 ug
  Filled 2022-07-06 (×4): qty 1
  Filled 2022-07-06 (×2): qty 3
  Filled 2022-07-06 (×2): qty 1
  Filled 2022-07-06: qty 3

## 2022-07-06 MED ORDER — ASPIRIN 81 MG PO CHEW
81.0000 mg | CHEWABLE_TABLET | Freq: Every day | ORAL | Status: DC
  Administered 2022-07-06: 81 mg
  Filled 2022-07-06: qty 1

## 2022-07-06 MED ORDER — FENTANYL BOLUS VIA INFUSION
25.0000 ug | INTRAVENOUS | Status: DC | PRN
  Administered 2022-07-06: 100 ug via INTRAVENOUS
  Administered 2022-07-07 – 2022-07-09 (×2): 50 ug via INTRAVENOUS

## 2022-07-06 MED ORDER — SODIUM BICARBONATE 8.4 % IV SOLN
INTRAVENOUS | Status: DC
  Filled 2022-07-06 (×2): qty 150
  Filled 2022-07-06 (×2): qty 1000

## 2022-07-06 MED ORDER — INSULIN ASPART 100 UNIT/ML IJ SOLN: 2.0000 [IU] | INTRAMUSCULAR | Status: DC

## 2022-07-06 MED ORDER — FENTANYL 2500MCG IN NS 250ML (10MCG/ML) PREMIX INFUSION
25.0000 ug/h | INTRAVENOUS | Status: DC
  Administered 2022-07-06: 50 ug/h via INTRAVENOUS
  Administered 2022-07-07 – 2022-07-08 (×3): 150 ug/h via INTRAVENOUS
  Administered 2022-07-09: 175 ug/h via INTRAVENOUS
  Filled 2022-07-06 (×5): qty 250

## 2022-07-06 MED ORDER — SODIUM CHLORIDE 0.9 % IV SOLN
2.0000 g | INTRAVENOUS | Status: DC
  Administered 2022-07-06 – 2022-07-10 (×5): 2 g via INTRAVENOUS
  Filled 2022-07-06 (×5): qty 20

## 2022-07-06 MED ORDER — ALBUMIN HUMAN 5 % IV SOLN
25.0000 g | Freq: Once | INTRAVENOUS | Status: AC
  Administered 2022-07-06: 25 g via INTRAVENOUS
  Filled 2022-07-06: qty 500

## 2022-07-06 MED ORDER — POTASSIUM CHLORIDE 10 MEQ/100ML IV SOLN
10.0000 meq | Freq: Once | INTRAVENOUS | Status: AC
  Administered 2022-07-06: 10 meq via INTRAVENOUS
  Filled 2022-07-06: qty 100

## 2022-07-06 MED ORDER — VASOPRESSIN 20 UNITS/100 ML INFUSION FOR SHOCK
0.0000 [IU]/min | INTRAVENOUS | Status: DC
  Administered 2022-07-06 – 2022-07-08 (×6): 0.03 [IU]/min via INTRAVENOUS
  Filled 2022-07-06 (×6): qty 100

## 2022-07-06 NOTE — Progress Notes (Signed)
1 Day Post-Op Ex lap (neg) Subjective: Febrile since surgery.  Increasing pressor requirement, bi carb gtt started, vasopressin gtt started  Objective: Vital signs in last 24 hours: Temp:  [94.8 F (34.9 C)-101.3 F (38.5 C)] 101.3 F (38.5 C) (11/25 1100) Pulse Rate:  [72-105] 94 (11/25 1100) Resp:  [16-36] 27 (11/25 1100) BP: (46-145)/(33-96) 101/52 (11/25 0733) SpO2:  [87 %-100 %] 94 % (11/25 1100) Arterial Line BP: (81-131)/(43-65) 122/63 (11/25 1100) FiO2 (%):  [40 %-100 %] 50 % (11/25 0733) Weight:  [78.2 kg] 78.2 kg (11/25 0500)   Intake/Output from previous day: 11/24 0701 - 11/25 0700 In: 8456.6 [I.V.:4588.6; IV Piggyback:3868] Out: 1120 [Urine:1100; Blood:20] Intake/Output this shift: Total I/O In: 1191.3 [I.V.:291.5; IV Piggyback:899.9] Out: 245 [Urine:245]   General appearance: toxic and intubated, sedated  Incision: no significant drainage  Lab Results:  Recent Labs    07/06/22 0021 07/06/22 0312 07/06/22 0513 07/06/22 0555  WBC 4.5 6.1  --   --   HGB 14.5 14.0 12.2 11.0*  HCT 45.0 44.1 36.0 33.8*  PLT 237 231  --   --    BMET Recent Labs    07/06/22 0021 07/06/22 0312 07/06/22 0513  NA 138 137 139  K 3.9 3.4* 3.3*  CL 110 106  --   CO2 17* 16*  --   GLUCOSE 299* 313*  --   BUN 28* 31*  --   CREATININE 1.10* 1.33*  --   CALCIUM 7.3* 7.2*  --    PT/INR Recent Labs    07/05/22 1055 07/06/22 0021  LABPROT 13.4 15.6*  INR 1.0 1.3*   ABG Recent Labs    07/05/22 2201 07/06/22 0513  PHART 7.147* 7.260*  HCO3 14.9* 15.3*    MEDS, Scheduled  aspirin  81 mg Per Tube Daily   Chlorhexidine Gluconate Cloth  6 each Topical Daily   heparin  5,000 Units Subcutaneous Q8H   insulin aspart  0-9 Units Subcutaneous Q4H   levothyroxine  88 mcg Per Tube Q0600   mouth rinse  15 mL Mouth Rinse Q2H   pantoprazole (PROTONIX) IV  40 mg Intravenous Q12H    Studies/Results: DG Chest Portable 1 View  Result Date: 07/05/2022 CLINICAL DATA:   Central line placement. EXAM: PORTABLE CHEST 1 VIEW COMPARISON:  April 30, 2022. FINDINGS: The heart size and mediastinal contours are within normal limits. Interval placement of left internal jugular catheter with distal tip in expected position of the SVC. No pneumothorax is noted. Both lungs are clear. The visualized skeletal structures are unremarkable. IMPRESSION: Interval placement of left internal jugular catheter with distal tip in expected position of the SVC. Electronically Signed   By: Marijo Conception M.D.   On: 07/05/2022 17:42   CT Angio Chest/Abd/Pel for Dissection W and/or Wo Contrast  Result Date: 07/05/2022 CLINICAL DATA:  Acute aortic syndrome. EXAM: CT ANGIOGRAPHY CHEST, ABDOMEN AND PELVIS TECHNIQUE: Non-contrast CT of the chest was initially obtained. Multidetector CT imaging through the chest, abdomen and pelvis was performed using the standard protocol during bolus administration of intravenous contrast. Multiplanar reconstructed images and MIPs were obtained and reviewed to evaluate the vascular anatomy. RADIATION DOSE REDUCTION: This exam was performed according to the departmental dose-optimization program which includes automated exposure control, adjustment of the mA and/or kV according to patient size and/or use of iterative reconstruction technique. CONTRAST:  1m OMNIPAQUE IOHEXOL 350 MG/ML SOLN COMPARISON:  May 16, 2020. FINDINGS: CTA CHEST FINDINGS Cardiovascular: Preferential opacification of the thoracic aorta. No  evidence of thoracic aortic aneurysm or dissection. Normal heart size. No pericardial effusion. Mediastinum/Nodes: Moderate size sliding-type hiatal hernia. Thyroid gland is unremarkable. No adenopathy is noted. Lungs/Pleura: No pneumothorax or pleural effusion is noted. Mild bibasilar subsegmental atelectasis is noted. Musculoskeletal: No chest wall abnormality. No acute or significant osseous findings. Review of the MIP images confirms the above findings.  CTA ABDOMEN AND PELVIS FINDINGS VASCULAR Aorta: Normal caliber aorta without aneurysm, dissection, vasculitis or significant stenosis. Celiac: Patent without evidence of aneurysm, dissection, vasculitis or significant stenosis. SMA: Patent without evidence of aneurysm, dissection, vasculitis or significant stenosis. Renals: Both renal arteries are patent without evidence of aneurysm, dissection, vasculitis, fibromuscular dysplasia or significant stenosis. IMA: Patent without evidence of aneurysm, dissection, vasculitis or significant stenosis. Inflow: Patent without evidence of aneurysm, dissection, vasculitis or significant stenosis. Veins: No obvious venous abnormality within the limitations of this arterial phase study. Review of the MIP images confirms the above findings. NON-VASCULAR Hepatobiliary: No focal liver abnormality is seen. No gallstones, gallbladder wall thickening, or biliary dilatation. Pancreas: Unremarkable. No pancreatic ductal dilatation or surrounding inflammatory changes. Spleen: Normal in size without focal abnormality. Adrenals/Urinary Tract: Adrenal glands are unremarkable. Kidneys are normal, without renal calculi, focal lesion, or hydronephrosis. Bladder is unremarkable. Stomach/Bowel: The stomach is unremarkable. There is no small bowel dilatation. The colon appears to be moderately to severely dilated and full of stool. There appears to be some degree of wall thickening involving the sigmoid colon suggesting some degree of infectious or inflammatory colitis. Lymphatic: No adenopathy is noted. Reproductive: Status post hysterectomy. No adnexal masses. Other: No abdominal wall hernia or abnormality. No abdominopelvic ascites. Musculoskeletal: No acute or significant osseous findings. Review of the MIP images confirms the above findings. IMPRESSION: No evidence of thoracic or abdominal aortic dissection or aneurysm. Moderate to severe dilatation of the colon is noted with large amount of  stool seen throughout the colon. There appears to be some degree of wall thickening involving the sigmoid colon suggesting infectious or inflammatory colitis. Moderate size sliding-type hiatal hernia. Mild bibasilar subsegmental atelectasis. Electronically Signed   By: Marijo Conception M.D.   On: 07/05/2022 13:33   CT ANGIO HEAD NECK W WO CM (CODE STROKE)  Result Date: 07/05/2022 CLINICAL DATA:  Left-sided weakness. EXAM: CT ANGIOGRAPHY HEAD AND NECK TECHNIQUE: Multidetector CT imaging of the head and neck was performed using the standard protocol during bolus administration of intravenous contrast. Multiplanar CT image reconstructions and MIPs were obtained to evaluate the vascular anatomy. Carotid stenosis measurements (when applicable) are obtained utilizing NASCET criteria, using the distal internal carotid diameter as the denominator. RADIATION DOSE REDUCTION: This exam was performed according to the departmental dose-optimization program which includes automated exposure control, adjustment of the mA and/or kV according to patient size and/or use of iterative reconstruction technique. CONTRAST:  19m OMNIPAQUE IOHEXOL 350 MG/ML SOLN COMPARISON:  Head and neck CTA 04/30/2022 FINDINGS: CTA NECK FINDINGS Aortic arch: Normal variant aortic arch branching pattern with common origin of the brachiocephalic and left common carotid arteries. Wide patency of the brachiocephalic and subclavian arteries. Right carotid system: Patent without evidence of a stenosis, dissection, or significant atherosclerosis. Unchanged beading of the distal cervical ICA. Left carotid system: Patent with minimal atheromatous plaque noted in the carotid bulb. No evidence of a significant stenosis or dissection. Unchanged beading of the distal cervical ICA. Vertebral arteries: Patent without evidence of a stenosis, dissection, or significant atherosclerosis. Strongly dominant right vertebral artery. Unchanged 4 mm inferiorly directed  outpouching  from the right V3 segment at the C1 level. Skeleton: Advanced disc degeneration at C5-6. Interbody ankylosis at C6-7. Advanced facet arthrosis with ankylosis at C4-5. 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 No evidence of cervical lymphadenopathy. Upper chest: No apical lung consolidation or mass. Review of the MIP images confirms the above findings CTA HEAD FINDINGS Anterior circulation: The internal carotid arteries are widely patent from skull base to carotid termini. ACAs and MCAs are patent without evidence of a proximal branch occlusion or significant proximal stenosis. No aneurysm is identified. Posterior circulation: The intracranial right vertebral artery is widely patent and supplies the basilar. The left vertebral artery is diminutive and poorly visualized distal to the PICA origin. Patent bilateral PICA, right AICA, and bilateral SCA origins are visualized. The basilar artery is widely patent. There are small right and large left posterior communicating arteries with hypoplasia of the left P1 segment. Both PCAs are patent without evidence of a significant proximal stenosis. No aneurysm is identified. Venous sinuses: Poorly evaluated due to arterial contrast timing. Anatomic variants: Predominantly fetal type origin of the left PCA. Review of the MIP images confirms the above findings These results were communicated to Dr. Leonel Ramsay at [place] am on 07/05/2022 by text page via the [facility] messaging system. IMPRESSION: 1. Minimal atherosclerosis without an emergent large vessel occlusion or significant proximal stenosis in the head or neck. 2. 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 3. 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 Electronically Signed   By: Logan Bores M.D.   On: 07/05/2022 11:38   CT HEAD CODE STROKE WO CONTRAST  Result Date: 07/05/2022 CLINICAL DATA:   Code stroke.  Left-sided weakness. EXAM: CT HEAD WITHOUT CONTRAST TECHNIQUE: Contiguous axial images were obtained from the base of the skull through the vertex without intravenous contrast. RADIATION DOSE REDUCTION: This exam was performed according to the departmental dose-optimization program which includes automated exposure control, adjustment of the mA and/or kV according to patient size and/or use of iterative reconstruction technique. COMPARISON:  Head CT and MRI 04/30/2022 FINDINGS: Brain: There is no evidence of an acute infarct, intracranial hemorrhage, mass, midline shift, or extra-axial fluid collection. The ventricles are normal in size. Hypodensities in the cerebral white matter bilaterally are unchanged and nonspecific but compatible with mild chronic small vessel ischemic disease. Vascular: Calcified atherosclerosis at the skull base. No hyperdense vessel. Skull: No fracture or suspicious osseous lesion. Small bilateral parietal skull osteomas. Sinuses/Orbits: Bilateral cataract extraction. Visualized paranasal sinuses and mastoid air cells are clear. Other: None. ASPECTS [facility] Stroke Program Early CT Score) - Ganglionic level infarction (caudate, lentiform nuclei, internal capsule, insula, M1-M3 cortex): 7 - Supraganglionic infarction (M4-M6 cortex): 3 Total score (0-10 with 10 being normal): 10 Preliminary findings were communicated by telephone to Dr. Leonel Ramsay on 07/05/2022 at 11:01 a.m. IMPRESSION: 1. No evidence of acute intracranial abnormality. ASPECTS of 10. 2. Mild chronic small vessel ischemic disease. Electronically Signed   By: Logan Bores M.D.   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   TIA (transient ischemic attack) 04/30/2022   Acute respiratory failure with hypoxia ([place]) 05/11/2020  Right upper lobe pneumonia 05/11/2020   Hyponatremia 05/11/2020   GERD (gastroesophageal reflux disease)    Anxiety with depression    Anxiety 11/14/2016   Swelling 06/18/2016   Hypothyroidism 05/17/2016   Abnormal CT scan 12/28/2015   Chronic low back pain 04/13/2015   Cervical disc disorder with radiculopathy of cervical region 06/20/2014   Bursitis of right shoulder 05/04/2014   Insomnia 04/21/2013   Routine health maintenance 01/28/2013   Essential hypertension 01/09/2012   Memory loss or impairment 12/02/2010   Fibromyalgia 05/04/2007    Pt s/p neg ex lap.  No obvious source for sepsis identified.    Plan: Medical care per CCM Nothing further from surgical standpoint  LOS: 1 day     .Rosario Adie, MD [facility], [place]    07/06/2022 11:38 AM

## 2022-07-06 NOTE — Progress Notes (Addendum)
eLink Physician-Brief Progress Note Patient Name: [name] DOB: 1940-12-25 MRN: [id number]   Date of Service  07/06/2022  HPI/Events of Note  Patient with progressive increase in pressor requirement, now on 23 mcg of Norepinephrine gtt, most recent PH 7.26, HCO3 15, Ca++ 1.05, reportedly with some maroon coloring in stool smear, recent hemoglobin 14  gm / dl.  eICU Interventions  Bicarb 100 meq iv x 1, Calcium gluconate 2 gm iv x 1, stat H & H ordered, 500 ml LR bolus x 1. Vasopressin gtt added. Protonix 40 mg iv Q 12 hours added.        Kerry Kass [place]ew 07/06/2022, 5:41 AM

## 2022-07-06 NOTE — Progress Notes (Signed)
   07/06/22 1010  Vitals  Temp (!) 101.1 F (38.4 C)    No tylenol ordered. Md notified, and no new orders at this time.

## 2022-07-06 NOTE — Progress Notes (Signed)
  Echocardiogram 2D Echocardiogram has been performed.  [name] 07/06/2022, 5:52 PM

## 2022-07-06 NOTE — Progress Notes (Signed)
PM rounds - remote  Remains on pressors 50% fiop2   Lactate better but still high Creat better Trop rise flattish  A Shock AKI LA  Seems some better after rluids PLAN  D.w CCS  0 ok to start laxative and tF  Will pan culture Wuil check doppler LE Will check stat echo Repelt fluid bolus Recheck lactica nd trop 9pm     SIGNATURE    Dr. Brand Males, M.D., F.C.C.P,  Pulmonary and Critical Care Medicine Staff Physician, [place] Director - Interstitial Lung Disease  Program  Medical Director - [facility] [place] [facility] [place], [place], [address]   Pager: [phone], If no answer  -> Check AMION or Try [phone] Telephone (clinical office): [phone] Telephone (research): [phone]  4:42 PM 07/06/2022   LABS    PULMONARY Recent Labs  Lab 07/05/22 1842 07/05/22 1953 07/05/22 2201 07/06/22 0513 07/06/22 1144  PHART 7.137* 7.179* 7.147* 7.260* 7.388  PCO2ART 51.3* 46.5 43.0 34.4 34.9  PO2ART 110* 259* 80* 74* 64*  HCO3 17.3* 17.4* 14.9* 15.3* 21.0  TCO2 19* 19* 16* 16* 22  O2SAT 96 100 92 92 92    CBC Recent Labs  Lab 07/06/22 0021 07/06/22 0312 07/06/22 0513 07/06/22 0555 07/06/22 1120 07/06/22 1144  HGB 14.5 14.0   < > 11.0* 11.6* 11.6*  HCT 45.0 44.1   < > 33.8* 35.2* 34.0*  WBC 4.5 6.1  --   --  7.7  --   PLT 237 231  --   --  200  200  --    < > = values in this interval not displayed.    COAGULATION Recent Labs  Lab 07/05/22 1055 07/06/22 0021 07/06/22 1120  INR 1.0 1.3* 1.5*    CARDIAC  No results for input(s): "TROPONINI" in the last 168 hours. No results for input(s): "PROBNP" in the last 168 hours.   CHEMISTRY Recent Labs  Lab 07/05/22 1055 07/05/22 1058 07/05/22 1842 07/05/22 2143 07/05/22 2201 07/06/22 0021 07/06/22 0312 07/06/22 0513 07/06/22 1120 07/06/22 1144  NA 138 137   < > 137   < > 138 137 139 138 138   K 4.6 4.6   < > 4.6   < > 3.9 3.4* 3.3* 3.3* 3.3*  CL 103 105  --  111  --  110 106  --  102  --   CO2 21*  --   --  15*  --  17* 16*  --  23  --   GLUCOSE 139* 139*  --  237*  --  299* 313*  --  197*  --   BUN 20 22  --  26*  --  28* 31*  --  34*  --   CREATININE 0.96 0.70  --  1.02*  --  1.10* 1.33*  --  1.28*  --   CALCIUM 9.7  --   --  7.5*  --  7.3* 7.2*  --  8.0*  --   MG  --   --   --  2.5*  --   --  2.5*  --   --   --   PHOS  --   --   --   --   --   --  3.8  --   --   --    < > = values in this interval not displayed.  Estimated Creatinine Clearance: 33.4 mL/min (A) (by C-G formula based on SCr of 1.28 mg/dL (H)).   LIVER Recent Labs  Lab 07/05/22 1055 07/06/22 0021 07/06/22 1120  AST 48* 55* 50*  ALT 36 35 32  ALKPHOS 166* 218* 186*  BILITOT 0.5 0.6 0.6  PROT 6.9 4.4* 4.6*  ALBUMIN 3.9 2.3* 2.8*  INR 1.0 1.3* 1.5*     INFECTIOUS Recent Labs  Lab 07/05/22 1447 07/06/22 0021 07/06/22 1120  LATICACIDVEN 3.7* 4.0* 3.1*     ENDOCRINE CBG (last 3)  Recent Labs    07/06/22 0719 07/06/22 1121 07/06/22 1518  GLUCAP 222* 146* 171*         IMAGING x48h  - image(s) personally visualized  -   highlighted in bold DG Abd Portable 1V  Result Date: 07/06/2022 CLINICAL DATA:  Ileus EXAM: PORTABLE ABDOMEN - 1 VIEW COMPARISON:  None Available. FINDINGS: Nonobstructive pattern of small bowel gas. Gas-filled although not overtly distended colon in the right hemiabdomen. Esophagogastric tube with tip and side port below the diaphragm. No obvious free air in the abdomen on supine radiographs. Status post midline laparotomy. IMPRESSION: 1. Nonobstructive pattern of small bowel gas. Gas-filled although not overtly distended colon in the right hemiabdomen. 2. Esophagogastric tube with tip and side port below the diaphragm. 3. Status post midline laparotomy. Electronically Signed   By: Delanna Ahmadi M.D.   On: 07/06/2022 14:32   DG Chest Portable 1 View  Result Date:  07/05/2022 CLINICAL DATA:  Central line placement. EXAM: PORTABLE CHEST 1 VIEW COMPARISON:  April 30, 2022. FINDINGS: The heart size and mediastinal contours are within normal limits. Interval placement of left internal jugular catheter with distal tip in expected position of the SVC. No pneumothorax is noted. Both lungs are clear. The visualized skeletal structures are unremarkable. IMPRESSION: Interval placement of left internal jugular catheter with distal tip in expected position of the SVC. Electronically Signed   By: Marijo Conception M.D.   On: 07/05/2022 17:42   CT Angio Chest/Abd/Pel for Dissection W and/or Wo Contrast  Result Date: 07/05/2022 CLINICAL DATA:  Acute aortic syndrome. EXAM: CT ANGIOGRAPHY CHEST, ABDOMEN AND PELVIS TECHNIQUE: Non-contrast CT of the chest was initially obtained. Multidetector CT imaging through the chest, abdomen and pelvis was performed using the standard protocol during bolus administration of intravenous contrast. Multiplanar reconstructed images and MIPs were obtained and reviewed to evaluate the vascular anatomy. RADIATION DOSE REDUCTION: This exam was performed according to the departmental dose-optimization program which includes automated exposure control, adjustment of the mA and/or kV according to patient size and/or use of iterative reconstruction technique. CONTRAST:  48m OMNIPAQUE IOHEXOL 350 MG/ML SOLN COMPARISON:  May 16, 2020. FINDINGS: CTA CHEST FINDINGS Cardiovascular: Preferential opacification of the thoracic aorta. No evidence of thoracic aortic aneurysm or dissection. Normal heart size. No pericardial effusion. Mediastinum/Nodes: Moderate size sliding-type hiatal hernia. Thyroid gland is unremarkable. No adenopathy is noted. Lungs/Pleura: No pneumothorax or pleural effusion is noted. Mild bibasilar subsegmental atelectasis is noted. Musculoskeletal: No chest wall abnormality. No acute or significant osseous findings. Review of the MIP images  confirms the above findings. CTA ABDOMEN AND PELVIS FINDINGS VASCULAR Aorta: Normal caliber aorta without aneurysm, dissection, vasculitis or significant stenosis. Celiac: Patent without evidence of aneurysm, dissection, vasculitis or significant stenosis. SMA: Patent without evidence of aneurysm, dissection, vasculitis or significant stenosis. Renals: Both renal arteries are patent without evidence of aneurysm, dissection, vasculitis, fibromuscular dysplasia or significant stenosis. IMA: Patent without evidence of aneurysm, dissection, vasculitis  or significant stenosis. Inflow: Patent without evidence of aneurysm, dissection, vasculitis or significant stenosis. Veins: No obvious venous abnormality within the limitations of this arterial phase study. Review of the MIP images confirms the above findings. NON-VASCULAR Hepatobiliary: No focal liver abnormality is seen. No gallstones, gallbladder wall thickening, or biliary dilatation. Pancreas: Unremarkable. No pancreatic ductal dilatation or surrounding inflammatory changes. Spleen: Normal in size without focal abnormality. Adrenals/Urinary Tract: Adrenal glands are unremarkable. Kidneys are normal, without renal calculi, focal lesion, or hydronephrosis. Bladder is unremarkable. Stomach/Bowel: The stomach is unremarkable. There is no small bowel dilatation. The colon appears to be moderately to severely dilated and full of stool. There appears to be some degree of wall thickening involving the sigmoid colon suggesting some degree of infectious or inflammatory colitis. Lymphatic: No adenopathy is noted. Reproductive: Status post hysterectomy. No adnexal masses. Other: No abdominal wall hernia or abnormality. No abdominopelvic ascites. Musculoskeletal: No acute or significant osseous findings. Review of the MIP images confirms the above findings. IMPRESSION: No evidence of thoracic or abdominal aortic dissection or aneurysm. Moderate to severe dilatation of the colon  is noted with large amount of stool seen throughout the colon. There appears to be some degree of wall thickening involving the sigmoid colon suggesting infectious or inflammatory colitis. Moderate size sliding-type hiatal hernia. Mild bibasilar subsegmental atelectasis. Electronically Signed   By: Marijo Conception M.D.   On: 07/05/2022 13:33   CT ANGIO HEAD NECK W WO CM (CODE STROKE)  Result Date: 07/05/2022 CLINICAL DATA:  Left-sided weakness. EXAM: CT ANGIOGRAPHY HEAD AND NECK TECHNIQUE: Multidetector CT imaging of the head and neck was performed using the standard protocol during bolus administration of intravenous contrast. Multiplanar CT image reconstructions and MIPs were obtained to evaluate the vascular anatomy. Carotid stenosis measurements (when applicable) are obtained utilizing NASCET criteria, using the distal internal carotid diameter as the denominator. RADIATION DOSE REDUCTION: This exam was performed according to the departmental dose-optimization program which includes automated exposure control, adjustment of the mA and/or kV according to patient size and/or use of iterative reconstruction technique. CONTRAST:  33m OMNIPAQUE IOHEXOL 350 MG/ML SOLN COMPARISON:  Head and neck CTA 04/30/2022 FINDINGS: CTA NECK FINDINGS Aortic arch: Normal variant aortic arch branching pattern with common origin of the brachiocephalic and left common carotid arteries. Wide patency of the brachiocephalic and subclavian arteries. Right carotid system: Patent without evidence of a stenosis, dissection, or significant atherosclerosis. Unchanged beading of the distal cervical ICA. Left carotid system: Patent with minimal atheromatous plaque noted in the carotid bulb. No evidence of a significant stenosis or dissection. Unchanged beading of the distal cervical ICA. Vertebral arteries: Patent without evidence of a stenosis, dissection, or significant atherosclerosis. Strongly dominant right vertebral artery. Unchanged  4 mm inferiorly directed outpouching from the right V3 segment at the C1 level. Skeleton: Advanced disc degeneration at C5-6. Interbody ankylosis at C6-7. Advanced facet arthrosis with ankylosis at C4-5. 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 No evidence of cervical lymphadenopathy. Upper chest: No apical lung consolidation or mass. Review of the MIP images confirms the above findings CTA HEAD FINDINGS Anterior circulation: The internal carotid arteries are widely patent from skull base to carotid termini. ACAs and MCAs are patent without evidence of a proximal branch occlusion or significant proximal stenosis. No aneurysm is identified. Posterior circulation: The intracranial right vertebral artery is widely patent and supplies the basilar. The left vertebral artery is diminutive and poorly visualized distal to the PICA  origin. Patent bilateral PICA, right AICA, and bilateral SCA origins are visualized. The basilar artery is widely patent. There are small right and large left posterior communicating arteries with hypoplasia of the left P1 segment. Both PCAs are patent without evidence of a significant proximal stenosis. No aneurysm is identified. Venous sinuses: Poorly evaluated due to arterial contrast timing. Anatomic variants: Predominantly fetal type origin of the left PCA. Review of the MIP images confirms the above findings These results were communicated to Dr. Leonel Ramsay at [place] am on 07/05/2022 by text page via the [facility] messaging system. IMPRESSION: 1. Minimal atherosclerosis without an emergent large vessel occlusion or significant proximal stenosis in the head or neck. 2. 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 3. 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 Electronically Signed   By: Logan Bores M.D.   On: 07/05/2022 11:38   CT HEAD CODE STROKE WO CONTRAST  Result Date:  07/05/2022 CLINICAL DATA:  Code stroke.  Left-sided weakness. EXAM: CT HEAD WITHOUT CONTRAST TECHNIQUE: Contiguous axial images were obtained from the base of the skull through the vertex without intravenous contrast. RADIATION DOSE REDUCTION: This exam was performed according to the departmental dose-optimization program which includes automated exposure control, adjustment of the mA and/or kV according to patient size and/or use of iterative reconstruction technique. COMPARISON:  Head CT and MRI 04/30/2022 FINDINGS: Brain: There is no evidence of an acute infarct, intracranial hemorrhage, mass, midline shift, or extra-axial fluid collection. The ventricles are normal in size. Hypodensities in the cerebral white matter bilaterally are unchanged and nonspecific but compatible with mild chronic small vessel ischemic disease. Vascular: Calcified atherosclerosis at the skull base. No hyperdense vessel. Skull: No fracture or suspicious osseous lesion. Small bilateral parietal skull osteomas. Sinuses/Orbits: Bilateral cataract extraction. Visualized paranasal sinuses and mastoid air cells are clear. Other: None. ASPECTS [facility] Stroke Program Early CT Score) - Ganglionic level infarction (caudate, lentiform nuclei, internal capsule, insula, M1-M3 cortex): 7 - Supraganglionic infarction (M4-M6 cortex): 3 Total score (0-10 with 10 being normal): 10 Preliminary findings were communicated by telephone to Dr. Leonel Ramsay on 07/05/2022 at 11:01 a.m. IMPRESSION: 1. No evidence of acute intracranial abnormality. ASPECTS of 10. 2. Mild chronic small vessel ischemic disease. Electronically Signed   By: Logan Bores M.D.   On: 07/05/2022 11:21

## 2022-07-06 NOTE — H&P (Addendum)
NAME:  [name], MRN:  [id number], DOB:  [birth year], LOS: 1 ADMISSION DATE:  07/05/2022, CONSULTATION DATE:  07/05/22 REFERRING MD:  Truett Mainland CHIEF COMPLAINT:  Abd pain   BRIEF  [name] is a 81 y.o. female who has a PMH as below. She presented to [facility] ED 11/24 with abdominal pain. She has chronic constipation and goes 3 - 4 days between bowel movements. She was sitting on commode morning of 11/24 attempting to have BM when she had worsening pain and near syncopal episode. She began to moan and yell for help and daughter then found her slumped over on the floor. She was awake but was drooling; therefore, a code stroke was activated en route to ED.  In ED, she was found to be hypotensive with SBP in 70s. She was given 2L IVF and had some response, though, she remained hypotensive. She was subsequently started on Levophed. CT and CTA head was negative. CTA chest/abdomen/pelvis was negative for dissection or aneurysm; however, showed moderate to severe dilatation of the colon is noted with large amount of stool seen throughout the colo along with some wall thickening involving the sigmoid colon suggesting infectious or inflammatory colitis.  Pertinent  Medical History:  has Fibromyalgia; Memory loss or impairment; Essential hypertension; Routine health maintenance; Insomnia; Bursitis of right shoulder; Cervical disc disorder with radiculopathy of cervical region; Chronic low back pain; Abnormal CT scan; Hypothyroidism; Swelling; Anxiety; Acute respiratory failure with hypoxia ([place]); Right upper lobe pneumonia; Hyponatremia; GERD (gastroesophageal reflux disease); Anxiety with depression; Aphasic agraphia; TIA (transient ischemic attack); Atrioventricular block, Mobitz type 1, Wenckebach; Dizziness; Prolonged Q-T interval on ECG; Septic shock ([place]); and Hypovolemia on their problem list.  Significant Hospital Events: Including procedures, antibiotic start and stop dates in addition to other  pertinent events   11/24 > admit. [facility] -plap to OPr -Dr Dema Severin- > normal findings. Retrun on Vent and contijued shock  - CVL in ER  Interim History / Subjective:    07/06/22-normal findings in the operating room yesterday.  Currently still remains on the ventilator 40% FiO2 but in refractory shock with Levophed 4 mcg and also vasopressin and stress dose.  Making urine.  Remains on bicarbonate drip with rising lactic acidosis.  On Flagyl antibiotic.  Daughter [name] at the bedside and is concerned that patient might not survive this illness.  Objective:  Blood pressure (!) 101/52, pulse 94, temperature (!) 101.3 F (38.5 C), temperature source Esophageal, resp. rate (!) 27, weight 78.2 kg, SpO2 94 %.    Vent Mode: PRVC FiO2 (%):  [40 %-100 %] 50 % Set Rate:  [20 bmp] 20 bmp Vt Set:  [400 mL] 400 mL PEEP:  [8 cmH20] 8 cmH20 Plateau Pressure:  [20 cmH20] 20 cmH20   Intake/Output Summary (Last 24 hours) at 07/06/2022 1107 Last data filed at 07/06/2022 1000 Gross per 24 hour  Intake 9647.89 ml  Output 1365 ml  Net 8282.89 ml   Filed Weights   07/05/22 1000 07/06/22 0500  Weight: 68.8 kg 78.2 kg    Examination:General Appearance:  Looks criticall ill .  She looks more edematous today Head:  Normocephalic, without obvious abnormality, atraumatic Eyes:  PERRL - yes, conjunctiva/corneas - mudd     Ears:  Normal external ear canals, both ears Nose:  G tube - yes Throat:  ETT TUBE - yes , OG tube - no Neck:  Supple,  No enlargement/tenderness/nodules Lungs: Clear to auscultation bilaterally, Ventilator   Synchrony - yes Heart:  S1 and S2 normal, no murmur, CVP - no.  Pressors - levophed and vas Abdomen:  Soft, no masses, no organomegaly Genitalia / Rectal:  Not done Extremities:  Extremities- intact but has edema Skin:  ntact in exposed areas . Sacral area - not examined Neurologic:  Sedation - diprivan gtt -> RASS - -4 . Moves all 4s - x. CAM-ICU -cannot test. Orientation -was  oriented yesterday     Labs/imaging personally reviewed:  CT/CTA head 11/24 > negative. CTA chest/abd/pelv 11/24 > negative for dissection or aneurysm; however, showed moderate to severe dilatation of the colon is noted with large amount of stool seen throughout the colo along with some wall thickening involving the sigmoid colon suggesting infectious or inflammatory colitis.  Assessment & Plan:     ASSESSMENT / PLAN:  PULMONARY  A:  Acute respiratory failure secondary to septic shock and status post laparotomy -07/05/2022  07/06/2022 -> does not meet criteria to do spontaneous breathing trial due to sedation needs and shock needs  P:   Full ventilator support VAP bundle   NEUROLOGIC A:   Chronic back pain on meloxicam and tramadol Mild dementia on Aricept at home Depression/chronic pain on Cymbalta, Wellbutrin, Elavil and Xanax at home Presented with presyncope associated with straining at stool but code stroke ruled out  Acute abdominal pain at presentation -currently sedated on the ventilator  P:   Change propofol to fentanyl infusion Fentanyl as needed Versed as needed Hold home Cymbalta, Wellbutrin, Elavil and Xanax and Aricept and meloxicam and tramadol RASS sedation score 0 to -2    VASCULAR A:   Hypertension at home on lisinopril and metoprolol Circulatory shock at admission likely septic -colitis is the source but no diarrhea  P:  Hold home antihypertensives  MAP goal greater than 65 - Fluid bolus - Levophed - Vasopressin  CARDIAC STRUCTURAL A: Normal echocardiogram September 2023 and normal troponin this admission Sinus rhythm  P: Monitor Telemetry   INFECTIOUS A:   Chronic constiatopon on laxative Colitis without perforation present on CT scan at admission and similar findings on laparotomy 07/05/2022  11/25- made stool  P:   Zosyn 07/05/2022 - 07/05/2022 Flagyl 11/24>>  ceftriaxone 11/24 >> Awiat cuoture Will await CTS opinion  about doing laxatives  RENAL A:  New onset AKI at admission 1 mg percent 07/05/2022 [baseline 0.7 mg percent]  07/06/2022: Getting worse 1.33 mg percent.  P:  Fluid bolus Maintenance fluid Bicarbonate infusion Recheck creat   ELECTROLYTES  A:  Mild hypocalcemia P: Replete   METABALIC  Lactic acidosis admission getting worse  Plan  -Rechec  GASTROINTESTINAL A:   Chronic constipation Admitted with colitis 07/05/2022 -negative laparotomy findings   P:   NG tube to LIS Check with CCS about restarting tube feeds Hold laxatives and await CCS opinion about restarting this. Check KKUB   HEMATOLOGIC   - HEME A:  New anemia mild 07/06/2022   P:  - PRBC for hgb </= 6.9gm%    - exceptions are   -  if ACS susepcted/confirmed then transfuse for hgb </= 8.0gm%,  or    -  active bleeding with hemodynamic instability, then transfuse regardless of hemoglobin value   At at all times try to transfuse 1 unit prbc as possible with exception of active hemorrhage  ENDOCRINE A:   At risk for hyperglcymia Hypothyroid    P:   Ssi Hold ysntrhoid due to colitis     Best practice (evaluated daily):  Diet/type: clear liquids  DVT prophylaxis: prophylactic heparin  GI prophylaxis: N/A Lines: N/A Foley:  N/A Code Status:  full code Last date of multidisciplinary goals of care discussion: Updated daughter at bedside with RN present. DNR confirmed, OK with short term vasopressors.   Interdisciplinary Goals of Care Family Meeting   Date carried out: 07/06/2022  Location of the meeting: Bedside  Member's involved: Physician, Bedside Registered Nurse, Family Member or next of kin, and Other: RN Margreta Journey and daughter Si Gaul [name] of Attorney or acting medical decision maker: [name]     Discussion: We discussed goals of care for [employer] .  x  Code status: Limited Code or DNR with short term vent .  No PR if she arrest.  Short-term ventilation only.   No LTAC no tracheostomy.  Disposition: Continue current acute care  Time spent for the meeting: 10 min       [place]   The patient Enid Maultsby is critically ill with multiple organ systems failure and requires high complexity decision making for assessment and support, frequent evaluation and titration of therapies, application of advanced monitoring technologies and extensive interpretation of multiple databases.   Critical Care Time devoted to patient care services described in this note is  45  Minutes. This time reflects time of care of this signee Dr Brand Males. This critical care time does not reflect procedure time, or teaching time or supervisory time of PA/NP/Med student/Med Resident etc but could involve care discussion time     Dr. Brand Males, M.D., [facility].C.P Pulmonary and Critical Care Medicine Medical Director - [facility] ICU Staff Physician, [place] [facility] Pager: [phone], If no answer or between  15:00h - 7:00h: call [phone]  07/06/2022 11:07 AM    LABS    PULMONARY Recent Labs  Lab 07/05/22 1058 07/05/22 1842 07/05/22 1953 07/05/22 2201 07/06/22 0513  PHART  --  7.137* 7.179* 7.147* 7.260*  PCO2ART  --  51.3* 46.5 43.0 34.4  PO2ART  --  110* 259* 80* 74*  HCO3  --  17.3* 17.4* 14.9* 15.3*  TCO2 20* 19* 19* 16* 16*  O2SAT  --  96 100 92 92      CBC Recent Labs  Lab 07/05/22 2143 07/05/22 2201 07/06/22 0021 07/06/22 0312 07/06/22 0513 07/06/22 0555  HGB 15.2*   < > 14.5 14.0 12.2 11.0*  HCT 47.3*   < > 45.0 44.1 36.0 33.8*  WBC 5.8  --  4.5 6.1  --   --   PLT 246  --  237 231  --   --    < > = values in this interval not displayed.    COAGULATION Recent Labs  Lab 07/05/22 1055 07/06/22 0021  INR 1.0 1.3*    CARDIAC  No results for input(s): "TROPONINI" in the last 168 hours. No results for input(s): "PROBNP" in the last 168  hours.   CHEMISTRY Recent Labs  Lab 07/05/22 1055 07/05/22 1058 07/05/22 1842 07/05/22 2143 07/05/22 2201 07/06/22 0021 07/06/22 0312 07/06/22 0513  NA 138 137   < > 137 138 138 137 139  K 4.6 4.6   < > 4.6 4.4 3.9 3.4* 3.3*  CL 103 105  --  111  --  110 106  --   CO2 21*  --   --  15*  --  17* 16*  --   GLUCOSE 139* 139*  --  237*  --  299* 313*  --   BUN 20 22  --  26*  --  28* 31*  --   CREATININE 0.96 0.70  --  1.02*  --  1.10* 1.33*  --   CALCIUM 9.7  --   --  7.5*  --  7.3* 7.2*  --   MG  --   --   --  2.5*  --   --  2.5*  --   PHOS  --   --   --   --   --   --  3.8  --    < > = values in this interval not displayed.   Estimated Creatinine Clearance: 32.1 mL/min (A) (by C-G formula based on SCr of 1.33 mg/dL (H)).   LIVER Recent Labs  Lab 07/05/22 1055 07/06/22 0021  AST 48* 55*  ALT 36 35  ALKPHOS 166* 218*  BILITOT 0.5 0.6  PROT 6.9 4.4*  ALBUMIN 3.9 2.3*  INR 1.0 1.3*     INFECTIOUS Recent Labs  Lab 07/05/22 1447 07/06/22 0021  LATICACIDVEN 3.7* 4.0*     ENDOCRINE CBG (last 3)  Recent Labs    07/05/22 2317 07/06/22 0308 07/06/22 0719  GLUCAP 191* 305* 222*         IMAGING x48h  - image(s) personally visualized  -   highlighted in bold DG Chest Portable 1 View  Result Date: 07/05/2022 CLINICAL DATA:  Central line placement. EXAM: PORTABLE CHEST 1 VIEW COMPARISON:  April 30, 2022. FINDINGS: The heart size and mediastinal contours are within normal limits. Interval placement of left internal jugular catheter with distal tip in expected position of the SVC. No pneumothorax is noted. Both lungs are clear. The visualized skeletal structures are unremarkable. IMPRESSION: Interval placement of left internal jugular catheter with distal tip in expected position of the SVC. Electronically Signed   By: Marijo Conception M.D.   On: 07/05/2022 17:42   CT Angio Chest/Abd/Pel for Dissection W and/or Wo Contrast  Result Date: 07/05/2022 CLINICAL  DATA:  Acute aortic syndrome. EXAM: CT ANGIOGRAPHY CHEST, ABDOMEN AND PELVIS TECHNIQUE: Non-contrast CT of the chest was initially obtained. Multidetector CT imaging through the chest, abdomen and pelvis was performed using the standard protocol during bolus administration of intravenous contrast. Multiplanar reconstructed images and MIPs were obtained and reviewed to evaluate the vascular anatomy. RADIATION DOSE REDUCTION: This exam was performed according to the departmental dose-optimization program which includes automated exposure control, adjustment of the mA and/or kV according to patient size and/or use of iterative reconstruction technique. CONTRAST:  63m OMNIPAQUE IOHEXOL 350 MG/ML SOLN COMPARISON:  May 16, 2020. FINDINGS: CTA CHEST FINDINGS Cardiovascular: Preferential opacification of the thoracic aorta. No evidence of thoracic aortic aneurysm or dissection. Normal heart size. No pericardial effusion. Mediastinum/Nodes: Moderate size sliding-type hiatal hernia. Thyroid gland is unremarkable. No adenopathy is noted. Lungs/Pleura: No pneumothorax or pleural effusion is noted. Mild bibasilar subsegmental atelectasis is noted. Musculoskeletal: No chest wall abnormality. No acute or significant osseous findings. Review of the MIP images confirms the above findings. CTA ABDOMEN AND PELVIS FINDINGS VASCULAR Aorta: Normal caliber aorta without aneurysm, dissection, vasculitis or significant stenosis. Celiac: Patent without evidence of aneurysm, dissection, vasculitis or significant stenosis. SMA: Patent without evidence of aneurysm, dissection, vasculitis or significant stenosis. Renals: Both renal arteries are patent without evidence of aneurysm, dissection, vasculitis, fibromuscular dysplasia or significant stenosis. IMA: Patent without evidence of aneurysm, dissection, vasculitis or significant stenosis. Inflow: Patent without evidence of  aneurysm, dissection, vasculitis or significant stenosis. Veins: No  obvious venous abnormality within the limitations of this arterial phase study. Review of the MIP images confirms the above findings. NON-VASCULAR Hepatobiliary: No focal liver abnormality is seen. No gallstones, gallbladder wall thickening, or biliary dilatation. Pancreas: Unremarkable. No pancreatic ductal dilatation or surrounding inflammatory changes. Spleen: Normal in size without focal abnormality. Adrenals/Urinary Tract: Adrenal glands are unremarkable. Kidneys are normal, without renal calculi, focal lesion, or hydronephrosis. Bladder is unremarkable. Stomach/Bowel: The stomach is unremarkable. There is no small bowel dilatation. The colon appears to be moderately to severely dilated and full of stool. There appears to be some degree of wall thickening involving the sigmoid colon suggesting some degree of infectious or inflammatory colitis. Lymphatic: No adenopathy is noted. Reproductive: Status post hysterectomy. No adnexal masses. Other: No abdominal wall hernia or abnormality. No abdominopelvic ascites. Musculoskeletal: No acute or significant osseous findings. Review of the MIP images confirms the above findings. IMPRESSION: No evidence of thoracic or abdominal aortic dissection or aneurysm. Moderate to severe dilatation of the colon is noted with large amount of stool seen throughout the colon. There appears to be some degree of wall thickening involving the sigmoid colon suggesting infectious or inflammatory colitis. Moderate size sliding-type hiatal hernia. Mild bibasilar subsegmental atelectasis. Electronically Signed   By: Marijo Conception M.D.   On: 07/05/2022 13:33   CT ANGIO HEAD NECK W WO CM (CODE STROKE)  Result Date: 07/05/2022 CLINICAL DATA:  Left-sided weakness. EXAM: CT ANGIOGRAPHY HEAD AND NECK TECHNIQUE: Multidetector CT imaging of the head and neck was performed using the standard protocol during bolus administration of intravenous contrast. Multiplanar CT image reconstructions and  MIPs were obtained to evaluate the vascular anatomy. Carotid stenosis measurements (when applicable) are obtained utilizing NASCET criteria, using the distal internal carotid diameter as the denominator. RADIATION DOSE REDUCTION: This exam was performed according to the departmental dose-optimization program which includes automated exposure control, adjustment of the mA and/or kV according to patient size and/or use of iterative reconstruction technique. CONTRAST:  29m OMNIPAQUE IOHEXOL 350 MG/ML SOLN COMPARISON:  Head and neck CTA 04/30/2022 FINDINGS: CTA NECK FINDINGS Aortic arch: Normal variant aortic arch branching pattern with common origin of the brachiocephalic and left common carotid arteries. Wide patency of the brachiocephalic and subclavian arteries. Right carotid system: Patent without evidence of a stenosis, dissection, or significant atherosclerosis. Unchanged beading of the distal cervical ICA. Left carotid system: Patent with minimal atheromatous plaque noted in the carotid bulb. No evidence of a significant stenosis or dissection. Unchanged beading of the distal cervical ICA. Vertebral arteries: Patent without evidence of a stenosis, dissection, or significant atherosclerosis. Strongly dominant right vertebral artery. Unchanged 4 mm inferiorly directed outpouching from the right V3 segment at the C1 level. Skeleton: Advanced disc degeneration at C5-6. Interbody ankylosis at C6-7. Advanced facet arthrosis with ankylosis at C4-5. 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 No evidence of cervical lymphadenopathy. Upper chest: No apical lung consolidation or mass. Review of the MIP images confirms the above findings CTA HEAD FINDINGS Anterior circulation: The internal carotid arteries are widely patent from skull base to carotid termini. ACAs and MCAs are patent without evidence of a proximal branch occlusion or significant proximal stenosis. No aneurysm is identified.  Posterior circulation: The intracranial right vertebral artery is widely patent and supplies the basilar. The left vertebral artery is diminutive and poorly visualized distal to the PICA origin. Patent bilateral PICA, right AICA, and  bilateral SCA origins are visualized. The basilar artery is widely patent. There are small right and large left posterior communicating arteries with hypoplasia of the left P1 segment. Both PCAs are patent without evidence of a significant proximal stenosis. No aneurysm is identified. Venous sinuses: Poorly evaluated due to arterial contrast timing. Anatomic variants: Predominantly fetal type origin of the left PCA. Review of the MIP images confirms the above findings These results were communicated to Dr. Leonel Ramsay at [place] am on 07/05/2022 by text page via the [facility] messaging system. IMPRESSION: 1. Minimal atherosclerosis without an emergent large vessel occlusion or significant proximal stenosis in the head or neck. 2. 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 3. 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 Electronically Signed   By: Logan Bores M.D.   On: 07/05/2022 11:38   CT HEAD CODE STROKE WO CONTRAST  Result Date: 07/05/2022 CLINICAL DATA:  Code stroke.  Left-sided weakness. EXAM: CT HEAD WITHOUT CONTRAST TECHNIQUE: Contiguous axial images were obtained from the base of the skull through the vertex without intravenous contrast. RADIATION DOSE REDUCTION: This exam was performed according to the departmental dose-optimization program which includes automated exposure control, adjustment of the mA and/or kV according to patient size and/or use of iterative reconstruction technique. COMPARISON:  Head CT and MRI 04/30/2022 FINDINGS: Brain: There is no evidence of an acute infarct, intracranial hemorrhage, mass, midline shift, or extra-axial fluid collection. The ventricles are normal in  size. Hypodensities in the cerebral white matter bilaterally are unchanged and nonspecific but compatible with mild chronic small vessel ischemic disease. Vascular: Calcified atherosclerosis at the skull base. No hyperdense vessel. Skull: No fracture or suspicious osseous lesion. Small bilateral parietal skull osteomas. Sinuses/Orbits: Bilateral cataract extraction. Visualized paranasal sinuses and mastoid air cells are clear. Other: None. ASPECTS [facility] Stroke Program Early CT Score) - Ganglionic level infarction (caudate, lentiform nuclei, internal capsule, insula, M1-M3 cortex): 7 - Supraganglionic infarction (M4-M6 cortex): 3 Total score (0-10 with 10 being normal): 10 Preliminary findings were communicated by telephone to Dr. Leonel Ramsay on 07/05/2022 at 11:01 a.m. IMPRESSION: 1. No evidence of acute intracranial abnormality. ASPECTS of 10. 2. Mild chronic small vessel ischemic disease. Electronically Signed   By: Logan Bores M.D.   On: 07/05/2022 11:21

## 2022-07-06 NOTE — Progress Notes (Signed)
eLink Physician-Brief Progress Note Patient Name: [name] DOB: 1941/07/01 MRN: [id number]   Date of Service  07/06/2022  HPI/Events of Note  Lactic acid 4.0. Albumin 2.3 gm / dl.  eICU Interventions  Albumin 5 % 500 ml iv fluid bolus x 1.        Kerry Kass Giovan Pinsky 07/06/2022, 1:49 AM

## 2022-07-06 NOTE — Progress Notes (Signed)
eLink Physician-Brief Progress Note Patient Name: [name] DOB: 09/24/1940 MRN: [id number]   Date of Service  07/06/2022  HPI/Events of Note  Blood sugar > 300 mg / dl.  eICU Interventions  D 5 % Bicarb gtt reduced to 50 ml / hour, SSI coverage upgraded to standard scale  from sensitive scale.        Kerry Kass Makya Phillis 07/06/2022, 3:26 AM

## 2022-07-06 NOTE — Progress Notes (Signed)
This RN noted at 0500 that bowel sounds were no longer able to be auscultated and that the patient had a small blood tinged BM. Abdomen no more distended or taut than at beginning of shift. Findings discussed with [facility] doctor, orders received.

## 2022-07-07 ENCOUNTER — Inpatient Hospital Stay (HOSPITAL_COMMUNITY): Payer: Medicare Other

## 2022-07-07 DIAGNOSIS — A419 Sepsis, unspecified organism: Secondary | ICD-10-CM | POA: Diagnosis not present

## 2022-07-07 DIAGNOSIS — R600 Localized edema: Secondary | ICD-10-CM

## 2022-07-07 DIAGNOSIS — R6521 Severe sepsis with septic shock: Secondary | ICD-10-CM | POA: Diagnosis not present

## 2022-07-07 LAB — CBC
HCT: 34.4 % — ABNORMAL LOW (ref 36.0–46.0)
Hemoglobin: 11 g/dL — ABNORMAL LOW (ref 12.0–15.0)
MCH: 30.6 pg (ref 26.0–34.0)
MCHC: 32 g/dL (ref 30.0–36.0)
MCV: 95.8 fL (ref 80.0–100.0)
Platelets: 184 10*3/uL (ref 150–400)
RBC: 3.59 MIL/uL — ABNORMAL LOW (ref 3.87–5.11)
RDW: 14 % (ref 11.5–15.5)
WBC: 7.3 10*3/uL (ref 4.0–10.5)
nRBC: 0 % (ref 0.0–0.2)

## 2022-07-07 LAB — COMPREHENSIVE METABOLIC PANEL
ALT: 32 U/L (ref 0–44)
AST: 59 U/L — ABNORMAL HIGH (ref 15–41)
Albumin: 2.4 g/dL — ABNORMAL LOW (ref 3.5–5.0)
Alkaline Phosphatase: 210 U/L — ABNORMAL HIGH (ref 38–126)
Anion gap: 13 (ref 5–15)
BUN: 35 mg/dL — ABNORMAL HIGH (ref 8–23)
CO2: 28 mmol/L (ref 22–32)
Calcium: 7.2 mg/dL — ABNORMAL LOW (ref 8.9–10.3)
Chloride: 97 mmol/L — ABNORMAL LOW (ref 98–111)
Creatinine, Ser: 1.19 mg/dL — ABNORMAL HIGH (ref 0.44–1.00)
GFR, Estimated: 46 mL/min — ABNORMAL LOW (ref >60)
Glucose, Bld: 162 mg/dL — ABNORMAL HIGH (ref 70–99)
Potassium: 3.6 mmol/L (ref 3.5–5.1)
Sodium: 138 mmol/L (ref 135–145)
Total Bilirubin: 0.5 mg/dL (ref 0.3–1.2)
Total Protein: 4.3 g/dL — ABNORMAL LOW (ref 6.5–8.1)

## 2022-07-07 LAB — POCT I-STAT 7, (LYTES, BLD GAS, ICA,H+H)
Acid-Base Excess: 6 mmol/L — ABNORMAL HIGH (ref 0.0–2.0)
Acid-Base Excess: 5 mmol/L — ABNORMAL HIGH (ref 0.0–2.0)
Acid-Base Excess: 5 mmol/L — ABNORMAL HIGH (ref 0.0–2.0)
Acid-Base Excess: 5 mmol/L — ABNORMAL HIGH (ref 0.0–2.0)
Bicarbonate: 34 mmol/L — ABNORMAL HIGH (ref 20.0–28.0)
Bicarbonate: 31.8 mmol/L — ABNORMAL HIGH (ref 20.0–28.0)
Bicarbonate: 30.5 mmol/L — ABNORMAL HIGH (ref 20.0–28.0)
Bicarbonate: 30.7 mmol/L — ABNORMAL HIGH (ref 20.0–28.0)
Calcium, Ion: 1.05 mmol/L — ABNORMAL LOW (ref 1.15–1.40)
Calcium, Ion: 0.99 mmol/L — ABNORMAL LOW (ref 1.15–1.40)
Calcium, Ion: 0.97 mmol/L — ABNORMAL LOW (ref 1.15–1.40)
Calcium, Ion: 0.97 mmol/L — ABNORMAL LOW (ref 1.15–1.40)
HCT: 25 % — ABNORMAL LOW (ref 36.0–46.0)
HCT: 32 % — ABNORMAL LOW (ref 36.0–46.0)
HCT: 31 % — ABNORMAL LOW (ref 36.0–46.0)
HCT: 31 % — ABNORMAL LOW (ref 36.0–46.0)
Hemoglobin: 8.5 g/dL — ABNORMAL LOW (ref 12.0–15.0)
Hemoglobin: 10.9 g/dL — ABNORMAL LOW (ref 12.0–15.0)
Hemoglobin: 10.5 g/dL — ABNORMAL LOW (ref 12.0–15.0)
Hemoglobin: 10.5 g/dL — ABNORMAL LOW (ref 12.0–15.0)
O2 Saturation: 91 %
O2 Saturation: 91 %
O2 Saturation: 95 %
O2 Saturation: 96 %
Patient temperature: 38.2
Patient temperature: 100.2
Potassium: 3.1 mmol/L — ABNORMAL LOW (ref 3.5–5.1)
Potassium: 3.5 mmol/L (ref 3.5–5.1)
Potassium: 3.8 mmol/L (ref 3.5–5.1)
Potassium: 3.7 mmol/L (ref 3.5–5.1)
Sodium: 142 mmol/L (ref 135–145)
Sodium: 137 mmol/L (ref 135–145)
Sodium: 134 mmol/L — ABNORMAL LOW (ref 135–145)
Sodium: 135 mmol/L (ref 135–145)
TCO2: 36 mmol/L — ABNORMAL HIGH (ref 22–32)
TCO2: 33 mmol/L — ABNORMAL HIGH (ref 22–32)
TCO2: 32 mmol/L (ref 22–32)
TCO2: 32 mmol/L (ref 22–32)
pCO2 arterial: 73 mmHg (ref 32–48)
pCO2 arterial: 54.4 mmHg — ABNORMAL HIGH (ref 32–48)
pCO2 arterial: 50.4 mmHg — ABNORMAL HIGH (ref 32–48)
pCO2 arterial: 50.2 mmHg — ABNORMAL HIGH (ref 32–48)
pH, Arterial: 7.283 — ABNORMAL LOW (ref 7.35–7.45)
pH, Arterial: 7.374 (ref 7.35–7.45)
pH, Arterial: 7.389 (ref 7.35–7.45)
pH, Arterial: 7.398 (ref 7.35–7.45)
pO2, Arterial: 77 mmHg — ABNORMAL LOW (ref 83–108)
pO2, Arterial: 64 mmHg — ABNORMAL LOW (ref 83–108)
pO2, Arterial: 79 mmHg — ABNORMAL LOW (ref 83–108)
pO2, Arterial: 88 mmHg (ref 83–108)

## 2022-07-07 LAB — GLUCOSE, CAPILLARY
Glucose-Capillary: 141 mg/dL — ABNORMAL HIGH (ref 70–99)
Glucose-Capillary: 132 mg/dL — ABNORMAL HIGH (ref 70–99)
Glucose-Capillary: 131 mg/dL — ABNORMAL HIGH (ref 70–99)
Glucose-Capillary: 133 mg/dL — ABNORMAL HIGH (ref 70–99)
Glucose-Capillary: 111 mg/dL — ABNORMAL HIGH (ref 70–99)
Glucose-Capillary: 117 mg/dL — ABNORMAL HIGH (ref 70–99)

## 2022-07-07 LAB — MAGNESIUM
Magnesium: 2.9 mg/dL — ABNORMAL HIGH (ref 1.7–2.4)
Magnesium: 2.8 mg/dL — ABNORMAL HIGH (ref 1.7–2.4)

## 2022-07-07 LAB — URINE CULTURE: Culture: NO GROWTH

## 2022-07-07 LAB — TROPONIN I (HIGH SENSITIVITY)
Troponin I (High Sensitivity): 58 ng/L — ABNORMAL HIGH (ref <18)
Troponin I (High Sensitivity): 47 ng/L — ABNORMAL HIGH (ref <18)

## 2022-07-07 LAB — PHOSPHORUS
Phosphorus: 5.1 mg/dL — ABNORMAL HIGH (ref 2.5–4.6)
Phosphorus: 4.2 mg/dL (ref 2.5–4.6)

## 2022-07-07 LAB — PROCALCITONIN: Procalcitonin: 31.94 ng/mL

## 2022-07-07 LAB — CORTISOL: Cortisol, Plasma: 22.6 ug/dL

## 2022-07-07 LAB — LACTIC ACID, PLASMA
Lactic Acid, Venous: 2.8 mmol/L (ref 0.5–1.9)
Lactic Acid, Venous: 2.8 mmol/L (ref 0.5–1.9)
Lactic Acid, Venous: 3.2 mmol/L (ref 0.5–1.9)
Lactic Acid, Venous: 3.1 mmol/L (ref 0.5–1.9)

## 2022-07-07 LAB — PROTIME-INR
INR: 1.8 — ABNORMAL HIGH (ref 0.8–1.2)
Prothrombin Time: 20.8 seconds — ABNORMAL HIGH (ref 11.4–15.2)

## 2022-07-07 MED ORDER — AMIODARONE HCL IN DEXTROSE 360-4.14 MG/200ML-% IV SOLN
30.0000 mg/h | INTRAVENOUS | Status: DC
  Administered 2022-07-07 – 2022-07-09 (×5): 30 mg/h via INTRAVENOUS
  Filled 2022-07-07 (×4): qty 200

## 2022-07-07 MED ORDER — CALCIUM GLUCONATE-NACL 1-0.675 GM/50ML-% IV SOLN
1.0000 g | Freq: Once | INTRAVENOUS | Status: AC
  Administered 2022-07-07: 1000 mg via INTRAVENOUS
  Filled 2022-07-07: qty 50

## 2022-07-07 MED ORDER — PROSOURCE TF20 ENFIT COMPATIBL EN LIQD
60.0000 mL | Freq: Every day | ENTERAL | Status: DC
  Administered 2022-07-07 – 2022-07-09 (×3): 60 mL
  Filled 2022-07-07 (×3): qty 60

## 2022-07-07 MED ORDER — AMIODARONE LOAD VIA INFUSION
150.0000 mg | Freq: Once | INTRAVENOUS | Status: AC
  Administered 2022-07-07: 150 mg via INTRAVENOUS
  Filled 2022-07-07: qty 83.34

## 2022-07-07 MED ORDER — LACTATED RINGERS IV BOLUS
500.0000 mL | Freq: Once | INTRAVENOUS | Status: AC
  Administered 2022-07-07: 500 mL via INTRAVENOUS

## 2022-07-07 MED ORDER — POTASSIUM CHLORIDE 20 MEQ PO PACK
40.0000 meq | PACK | Freq: Once | ORAL | Status: AC
  Administered 2022-07-07: 40 meq
  Filled 2022-07-07: qty 2

## 2022-07-07 MED ORDER — AMIODARONE HCL IN DEXTROSE 360-4.14 MG/200ML-% IV SOLN
60.0000 mg/h | INTRAVENOUS | Status: DC
  Administered 2022-07-07 (×2): 60 mg/h via INTRAVENOUS
  Filled 2022-07-07 (×2): qty 200

## 2022-07-07 MED ORDER — VITAL HIGH PROTEIN PO LIQD
1000.0000 mL | ORAL | Status: DC
  Administered 2022-07-07 – 2022-07-08 (×2): 1000 mL

## 2022-07-07 MED ORDER — POTASSIUM CHLORIDE 10 MEQ/50ML IV SOLN
10.0000 meq | INTRAVENOUS | Status: AC
  Administered 2022-07-07 (×4): 10 meq via INTRAVENOUS
  Filled 2022-07-07 (×4): qty 50

## 2022-07-07 NOTE — Progress Notes (Signed)
ABG results from 0449 were incorrectly entered on this patient. These results do not apply to this patient. The medical record will be corrected by POC on Monday.

## 2022-07-07 NOTE — Progress Notes (Signed)
Brief Nutrition Note  Consult received for enteral/tube feeding initiation and management.  Adult Enteral Nutrition Protocol initiated. Full assessment to follow.  NGT tube in place with tip located gastric per xray imaging.   Admitting Dx: Syncope and collapse [R55] Colitis [K52.9] Generalized weakness [R53.1] Sepsis ([place]) [A41.9] Sepsis, due to unspecified organism, unspecified whether acute organ dysfunction present ([place]) [A41.9]  Body mass index is 31.53 kg/m. Pt meets criteria for obesity class I based on current BMI.  Labs: Recent Labs  Lab 07/05/22 2143 07/05/22 2201 07/06/22 0312 07/06/22 0513 07/06/22 1120 07/06/22 1144 07/07/22 0323 07/07/22 0449 07/07/22 0754  NA 137   < > 137   < > 138   < > 138 142 137  K 4.6   < > 3.4*   < > 3.3*   < > 3.6 3.1* 3.5  CL 111   < > 106  --  102  --  97*  --   --   CO2 15*   < > 16*  --  23  --  28  --   --   BUN 26*   < > 31*  --  34*  --  35*  --   --   CREATININE 1.02*   < > 1.33*  --  1.28*  --  1.19*  --   --   CALCIUM 7.5*   < > 7.2*  --  8.0*  --  7.2*  --   --   MG 2.5*  --  2.5*  --   --   --  2.9*  --   --   PHOS  --   --  3.8  --   --   --  5.1*  --   --   GLUCOSE 237*   < > 313*  --  197*  --  162*  --   --    < > = values in this interval not displayed.    Ranell Patrick, RD, LDN Clinical Dietitian RD pager # available in [place]  After hours/weekend pager # available in [facility]

## 2022-07-07 NOTE — Progress Notes (Signed)
   Now  a fib RVR K low Calcium low QTc ok On levophed ABG ph ok  Plan   Amio gtt Correct lytes  Later try to change to neo Reduce bicarb gtt from 100 -> 75cc/h  10 min critical care  SIGNATURE    Dr. Brand Males, M.D., F.C.C.P,  Pulmonary and Critical Care Medicine Staff Physician, [place] Director - Interstitial Lung Disease  Program  Medical Director - [facility] [place] Pul[facility] [place], [place], [address]   Pager: [phone], If no answer  -[place] or [place] [phone] Telephone (clinical office): [phone] Telephone (research): [phone]  9:17 AM 07/07/2022

## 2022-07-07 NOTE — Progress Notes (Signed)
[facility] ADULT ICU REPLACEMENT PROTOCOL   The patient does apply for the [facility] Adult ICU Electrolyte Replacment Protocol based on the criteria listed below:   1.Exclusion criteria: TCTS, ECMO, Dialysis, and Myasthenia Gravis patients 2. Is GFR >/= 30 ml/min? Yes.    Patient's GFR today is 45 3. Is SCr </= 2? Yes.   Patient's SCr is 1.19 mg/dL 4. Did SCr increase >/= 0.5 in 24 hours? No. 5.Pt's weight >40kg  Yes.   6. Abnormal electrolyte(s): K+ 3.6  7. Electrolytes replaced per protocol 8.  Call MD STAT for K+ </= 2.5, Phos </= 1, or Mag </= 1 Physician:  Dr. Burnard Leigh  Carlisle Beers 07/07/2022 4:28 AM

## 2022-07-07 NOTE — Plan of Care (Signed)
  Problem: Clinical Measurements: Goal: Diagnostic test results will improve Outcome: Progressing   Problem: Nutrition: Goal: Adequate nutrition will be maintained Outcome: Progressing   Problem: Nutritional: Goal: Maintenance of adequate nutrition will improve Outcome: Progressing

## 2022-07-07 NOTE — Progress Notes (Signed)
Bilateral lower extremity venous duplex has been completed. Preliminary results can be found in CV Proc through chart review.   07/07/22 11:08 AM Carlos Levering RVT

## 2022-07-07 NOTE — Progress Notes (Signed)
   Recent Labs  Lab 07/06/22 0513 07/06/22 1144 07/07/22 0449 07/07/22 0754 07/07/22 1743  PHART 7.260* 7.388 7.283* 7.374 7.389  PCO2ART 34.4 34.9 73.0* 54.4* 50.4*  PO2ART 74* 64* 77* 64* 79*  HCO3 15.3* 21.0 34.0* 31.8* 30.5*  TCO2 16* 22 36* 33* 32  O2SAT 92 92 91 91 95   Recent Labs  Lab 07/07/22 0323 07/07/22 0744 07/07/22 1732  LATICACIDVEN 2.8* 2.8* 3.2*  PROCALCITON 31.94  --   --      pH normal on low dose bic gtt A Fib better on amio gtt But Lactate rising  Plan  - stop bic gtt  -500ccl LR bolus  - recheck abg and lactate at 9.30pm - recheck trop and lacatate 07/08/22      SIGNATURE    Dr. Brand Males, M.D., F.C.C.P,  Pulmonary and Critical Care Medicine Staff Physician, [place] Director - Interstitial Lung Disease  Program  Medical Director - Center ICU [facility] [place] at [place], [place], [address]   Pager: [phone], If no answer  -[place] or [place] [phone] Telephone (clinical office): [phone] Telephone (research): [phone]  6:35 PM 07/07/2022

## 2022-07-07 NOTE — Progress Notes (Signed)
2 Days Post-Op   Subjective/Chief Complaint: Intubated, sedated, on pressors   Objective: Vital signs in last 24 hours: Temp:  [99 F (37.2 C)-101.3 F (38.5 C)] 99.3 F (37.4 C) (11/26 0700) Pulse Rate:  [85-110] 101 (11/26 0615) Resp:  [19-27] 20 (11/26 0700) BP: (71-146)/(45-83) 116/57 (11/26 0733) SpO2:  [92 %-96 %] 95 % (11/26 0615) Arterial Line BP: (86-138)/(50-66) 110/52 (11/26 0700) FiO2 (%):  [40 %-50 %] 40 % (11/26 0733) Last BM Date : 07/06/22  Intake/Output from previous day: 11/25 0701 - 11/26 0700 In: 6004 [I.V.:4104.1; IV Piggyback:1900] Out: 1500 [Urine:1500] Intake/Output this shift: No intake/output data recorded.  Abdomen soft, wound without infection some drainage and I removed honeycomb today, nondistended  Lab Results:  Recent Labs    07/06/22 1120 07/06/22 1144 07/07/22 0323 07/07/22 0449  WBC 7.7  --  7.3  --   HGB 11.6*   < > 11.0* 8.5*  HCT 35.2*   < > 34.4* 25.0*  PLT 200  200  --  184  --    < > = values in this interval not displayed.   BMET Recent Labs    07/06/22 1120 07/06/22 1144 07/07/22 0323 07/07/22 0449  NA 138   < > 138 142  K 3.3*   < > 3.6 3.1*  CL 102  --  97*  --   CO2 23  --  28  --   GLUCOSE 197*  --  162*  --   BUN 34*  --  35*  --   CREATININE 1.28*  --  1.19*  --   CALCIUM 8.0*  --  7.2*  --    < > = values in this interval not displayed.   PT/INR Recent Labs    07/06/22 1120 07/07/22 0323  LABPROT 17.5* 20.8*  INR 1.5* 1.8*   ABG Recent Labs    07/06/22 1144 07/07/22 0449  PHART 7.388 7.283*  HCO3 21.0 34.0*    Studies/Results: DG Abd Portable 1V  Result Date: 07/06/2022 CLINICAL DATA:  Ileus EXAM: PORTABLE ABDOMEN - 1 VIEW COMPARISON:  None Available. FINDINGS: Nonobstructive pattern of small bowel gas. Gas-filled although not overtly distended colon in the right hemiabdomen. Esophagogastric tube with tip and side port below the diaphragm. No obvious free air in the abdomen on supine  radiographs. Status post midline laparotomy. IMPRESSION: 1. Nonobstructive pattern of small bowel gas. Gas-filled although not overtly distended colon in the right hemiabdomen. 2. Esophagogastric tube with tip and side port below the diaphragm. 3. Status post midline laparotomy. Electronically Signed   By: Delanna Ahmadi M.D.   On: 07/06/2022 14:32   DG Chest Portable 1 View  Result Date: 07/05/2022 CLINICAL DATA:  Central line placement. EXAM: PORTABLE CHEST 1 VIEW COMPARISON:  April 30, 2022. FINDINGS: The heart size and mediastinal contours are within normal limits. Interval placement of left internal jugular catheter with distal tip in expected position of the SVC. No pneumothorax is noted. Both lungs are clear. The visualized skeletal structures are unremarkable. IMPRESSION: Interval placement of left internal jugular catheter with distal tip in expected position of the SVC. Electronically Signed   By: Marijo Conception M.D.   On: 07/05/2022 17:42   CT Angio Chest/Abd/Pel for Dissection W and/or Wo Contrast  Result Date: 07/05/2022 CLINICAL DATA:  Acute aortic syndrome. EXAM: CT ANGIOGRAPHY CHEST, ABDOMEN AND PELVIS TECHNIQUE: Non-contrast CT of the chest was initially obtained. Multidetector CT imaging through the chest, abdomen and pelvis was performed  using the standard protocol during bolus administration of intravenous contrast. Multiplanar reconstructed images and MIPs were obtained and reviewed to evaluate the vascular anatomy. RADIATION DOSE REDUCTION: This exam was performed according to the departmental dose-optimization program which includes automated exposure control, adjustment of the mA and/or kV according to patient size and/or use of iterative reconstruction technique. CONTRAST:  18m OMNIPAQUE IOHEXOL 350 MG/ML SOLN COMPARISON:  May 16, 2020. FINDINGS: CTA CHEST FINDINGS Cardiovascular: Preferential opacification of the thoracic aorta. No evidence of thoracic aortic aneurysm or  dissection. Normal heart size. No pericardial effusion. Mediastinum/Nodes: Moderate size sliding-type hiatal hernia. Thyroid gland is unremarkable. No adenopathy is noted. Lungs/Pleura: No pneumothorax or pleural effusion is noted. Mild bibasilar subsegmental atelectasis is noted. Musculoskeletal: No chest wall abnormality. No acute or significant osseous findings. Review of the MIP images confirms the above findings. CTA ABDOMEN AND PELVIS FINDINGS VASCULAR Aorta: Normal caliber aorta without aneurysm, dissection, vasculitis or significant stenosis. Celiac: Patent without evidence of aneurysm, dissection, vasculitis or significant stenosis. SMA: Patent without evidence of aneurysm, dissection, vasculitis or significant stenosis. Renals: Both renal arteries are patent without evidence of aneurysm, dissection, vasculitis, fibromuscular dysplasia or significant stenosis. IMA: Patent without evidence of aneurysm, dissection, vasculitis or significant stenosis. Inflow: Patent without evidence of aneurysm, dissection, vasculitis or significant stenosis. Veins: No obvious venous abnormality within the limitations of this arterial phase study. Review of the MIP images confirms the above findings. NON-VASCULAR Hepatobiliary: No focal liver abnormality is seen. No gallstones, gallbladder wall thickening, or biliary dilatation. Pancreas: Unremarkable. No pancreatic ductal dilatation or surrounding inflammatory changes. Spleen: Normal in size without focal abnormality. Adrenals/Urinary Tract: Adrenal glands are unremarkable. Kidneys are normal, without renal calculi, focal lesion, or hydronephrosis. Bladder is unremarkable. Stomach/Bowel: The stomach is unremarkable. There is no small bowel dilatation. The colon appears to be moderately to severely dilated and full of stool. There appears to be some degree of wall thickening involving the sigmoid colon suggesting some degree of infectious or inflammatory colitis. Lymphatic: No  adenopathy is noted. Reproductive: Status post hysterectomy. No adnexal masses. Other: No abdominal wall hernia or abnormality. No abdominopelvic ascites. Musculoskeletal: No acute or significant osseous findings. Review of the MIP images confirms the above findings. IMPRESSION: No evidence of thoracic or abdominal aortic dissection or aneurysm. Moderate to severe dilatation of the colon is noted with large amount of stool seen throughout the colon. There appears to be some degree of wall thickening involving the sigmoid colon suggesting infectious or inflammatory colitis. Moderate size sliding-type hiatal hernia. Mild bibasilar subsegmental atelectasis. Electronically Signed   By: JMarijo ConceptionM.D.   On: 07/05/2022 13:33   CT ANGIO HEAD NECK W WO CM (CODE STROKE)  Result Date: 07/05/2022 CLINICAL DATA:  Left-sided weakness. EXAM: CT ANGIOGRAPHY HEAD AND NECK TECHNIQUE: Multidetector CT imaging of the head and neck was performed using the standard protocol during bolus administration of intravenous contrast. Multiplanar CT image reconstructions and MIPs were obtained to evaluate the vascular anatomy. Carotid stenosis measurements (when applicable) are obtained utilizing NASCET criteria, using the distal internal carotid diameter as the denominator. RADIATION DOSE REDUCTION: This exam was performed according to the departmental dose-optimization program which includes automated exposure control, adjustment of the mA and/or kV according to patient size and/or use of iterative reconstruction technique. CONTRAST:  759mOMNIPAQUE IOHEXOL 350 MG/ML SOLN COMPARISON:  Head and neck CTA 04/30/2022 FINDINGS: CTA NECK FINDINGS Aortic arch: Normal variant aortic arch branching pattern with common origin of the brachiocephalic and left  common carotid arteries. Wide patency of the brachiocephalic and subclavian arteries. Right carotid system: Patent without evidence of a stenosis, dissection, or significant  atherosclerosis. Unchanged beading of the distal cervical ICA. Left carotid system: Patent with minimal atheromatous plaque noted in the carotid bulb. No evidence of a significant stenosis or dissection. Unchanged beading of the distal cervical ICA. Vertebral arteries: Patent without evidence of a stenosis, dissection, or significant atherosclerosis. Strongly dominant right vertebral artery. Unchanged 4 mm inferiorly directed outpouching from the right V3 segment at the C1 level. Skeleton: Advanced disc degeneration at C5-6. Interbody ankylosis at C6-7. Advanced facet arthrosis with ankylosis at C4-5. 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 No evidence of cervical lymphadenopathy. Upper chest: No apical lung consolidation or mass. Review of the MIP images confirms the above findings CTA HEAD FINDINGS Anterior circulation: The internal carotid arteries are widely patent from skull base to carotid termini. ACAs and MCAs are patent without evidence of a proximal branch occlusion or significant proximal stenosis. No aneurysm is identified. Posterior circulation: The intracranial right vertebral artery is widely patent and supplies the basilar. The left vertebral artery is diminutive and poorly visualized distal to the PICA origin. Patent bilateral PICA, right AICA, and bilateral SCA origins are visualized. The basilar artery is widely patent. There are small right and large left posterior communicating arteries with hypoplasia of the left P1 segment. Both PCAs are patent without evidence of a significant proximal stenosis. No aneurysm is identified. Venous sinuses: Poorly evaluated due to arterial contrast timing. Anatomic variants: Predominantly fetal type origin of the left PCA. Review of the MIP images confirms the above findings These results were communicated to Dr. Leonel Ramsay at [place] am on 07/05/2022 by text page via the [facility] messaging system. IMPRESSION: 1. Minimal  atherosclerosis without an emergent large vessel occlusion or significant proximal stenosis in the head or neck. 2. 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 3. 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 Electronically Signed   By: Logan Bores M.D.   On: 07/05/2022 11:38   CT HEAD CODE STROKE WO CONTRAST  Result Date: 07/05/2022 CLINICAL DATA:  Code stroke.  Left-sided weakness. EXAM: CT HEAD WITHOUT CONTRAST TECHNIQUE: Contiguous axial images were obtained from the base of the skull through the vertex without intravenous contrast. RADIATION DOSE REDUCTION: This exam was performed according to the departmental dose-optimization program which includes automated exposure control, adjustment of the mA and/or kV according to patient size and/or use of iterative reconstruction technique. COMPARISON:  Head CT and MRI 04/30/2022 FINDINGS: Brain: There is no evidence of an acute infarct, intracranial hemorrhage, mass, midline shift, or extra-axial fluid collection. The ventricles are normal in size. Hypodensities in the cerebral white matter bilaterally are unchanged and nonspecific but compatible with mild chronic small vessel ischemic disease. Vascular: Calcified atherosclerosis at the skull base. No hyperdense vessel. Skull: No fracture or suspicious osseous lesion. Small bilateral parietal skull osteomas. Sinuses/Orbits: Bilateral cataract extraction. Visualized paranasal sinuses and mastoid air cells are clear. Other: None. ASPECTS [facility] Stroke Program Early CT Score) - Ganglionic level infarction (caudate, lentiform nuclei, internal capsule, insula, M1-M3 cortex): 7 - Supraganglionic infarction (M4-M6 cortex): 3 Total score (0-10 with 10 being normal): 10 Preliminary findings were communicated by telephone to Dr. Leonel Ramsay on 07/05/2022 at 11:01 a.m. IMPRESSION: 1. No evidence of acute intracranial abnormality. ASPECTS of  10. 2. Mild chronic small vessel ischemic disease. Electronically  Signed   By: Logan Bores M.D.   On: 07/05/2022 11:21    Anti-infectives: Anti-infectives (From admission, onward)    Start     Dose/Rate Route Frequency Ordered Stop   07/06/22 1230  cefTRIAXone (ROCEPHIN) 2 g in sodium chloride 0.9 % 100 mL IVPB        2 g 200 mL/hr over 30 Minutes Intravenous Every 24 hours 07/06/22 1131     07/05/22 1830  piperacillin-tazobactam (ZOSYN) IVPB 3.375 g        3.375 g 100 mL/hr over 30 Minutes Intravenous  Once 07/05/22 1827 07/05/22 1905   07/05/22 1345  cefTRIAXone (ROCEPHIN) 2 g in sodium chloride 0.9 % 100 mL IVPB        2 g 200 mL/hr over 30 Minutes Intravenous  Once 07/05/22 1341 07/05/22 1435   07/05/22 1345  metroNIDAZOLE (FLAGYL) IVPB 500 mg        500 mg 100 mL/hr over 60 Minutes Intravenous Every 12 hours 07/05/22 1341         Assessment/Plan: POD 2 elap (negative)- CW -elap for lactic acidosis, pressors concern for ischemia was negative -can start trickle tube feeds on her today -likely source of sepsis is infectious and lab derangements all associated -will follow with you  [name] 07/07/2022

## 2022-07-07 NOTE — Progress Notes (Signed)
NAME:  [name], MRN:  [id number], DOB:  [birth year], LOS: 2 ADMISSION DATE:  07/05/2022, CONSULTATION DATE:  07/05/22 REFERRING MD:  Truett Mainland CHIEF COMPLAINT:  Abd pain   BRIEF  [name] is a 81 y.o. female who has a PMH as below. She presented to [facility] ED 11/24 with abdominal pain. She has chronic constipation and goes 3 - 4 days between bowel movements. She was sitting on commode morning of 11/24 attempting to have BM when she had worsening pain and near syncopal episode. She began to moan and yell for help and daughter then found her slumped over on the floor. She was awake but was drooling; therefore, a code stroke was activated en route to ED.  In ED, she was found to be hypotensive with SBP in 70s. She was given 2L IVF and had some response, though, she remained hypotensive. She was subsequently started on Levophed. CT and CTA head was negative. CTA chest/abdomen/pelvis was negative for dissection or aneurysm; however, showed moderate to severe dilatation of the colon is noted with large amount of stool seen throughout the colo along with some wall thickening involving the sigmoid colon suggesting infectious or inflammatory colitis.  Pertinent  Medical History:  has Fibromyalgia; Memory loss or impairment; Essential hypertension; Routine health maintenance; Insomnia; Bursitis of right shoulder; Cervical disc disorder with radiculopathy of cervical region; Chronic low back pain; Abnormal CT scan; Hypothyroidism; Swelling; Anxiety; Acute respiratory failure with hypoxia ([place]); Right upper lobe pneumonia; Hyponatremia; GERD (gastroesophageal reflux disease); Anxiety with depression; Aphasic agraphia; TIA (transient ischemic attack); Atrioventricular block, Mobitz type 1, Wenckebach; Dizziness; Prolonged Q-T interval on ECG; Septic shock ([place]); and Hypovolemia on their problem list.  Significant Hospital Events: Including procedures, antibiotic start and stop dates in addition to other  pertinent events   11/24 > admit. [facility] -plap to OPr -Dr Dema Severin- > normal findings. Retrun on Vent and contijued shock  - CVL in ER  - A Line  - urine culture  - blood culture  - Mrsa pcr - neg  11/25 - -normal findings in the operating room yesterday.  Currently still remains on the ventilator 40% FiO2 but in refractory shock with Levophed 4 mcg and also vasopressin and stress dose.  Making urine.  Remains on bicarbonate drip with rising lactic acidosis.  On Flagyl antibiotic.  Daughter [name] at the bedside and is concerned that patient might not survive this illness.    Interim History / Subjective:   11/26 - vent 40% fio2, fent gtt, vaso gtt, levophed gtt, bic gtt +. Made urine. AKI better Fever improved. PCT > 30 but lactate still high > 2. CK normal. Lactulose overnight -> 1 stool. This AM - ccs ok with laxatives and tube feeds  Trop 47 Lipase normal  Echo - pending results Duplex LE - pending   Abg error per RT  Objective:  Blood pressure (!) 114/55, pulse (!) 101, temperature 99.3 F (37.4 C), resp. rate 20, weight 78.2 kg, SpO2 95 %.    Vent Mode: PRVC FiO2 (%):  [50 %] 50 % Set Rate:  [20 bmp] 20 bmp Vt Set:  [400 mL] 400 mL PEEP:  [8 cmH20] 8 cmH20 Plateau Pressure:  [20 cmH20-22 cmH20] 22 cmH20   Intake/Output Summary (Last 24 hours) at 07/07/2022 0728 Last data filed at 07/07/2022 0600 Gross per 24 hour  Intake 5827.7 ml  Output 1500 ml  Net 4327.7 ml   Filed Weights   07/05/22 1000 07/06/22 0500  Weight: 68.8 kg 78.2 kg   General Appearance:  Looks criticall ill  Head:  Normocephalic, without obvious abnormality, atraumatic Eyes:  PERRL - yes, conjunctiva/corneas - muddy     Ears:  Normal external ear canals, both ears Nose:  G tube - yes Throat:  ETT TUBE - no , OG tube - no Neck:  Supple,  No enlargement/tenderness/nodules Lungs: Clear to auscultation bilaterally, Ventilator   Synchrony - yes 40% Heart:  S1 and S2 normal, no murmur, CVP - x.   Pressors - levophed and vaso Abdomen:  Soft, no masses, no organomegaly Genitalia / Rectal:  Not done Extremities:  Extremities- intact Skin:  ntact in exposed areas . Sacral area - not examined Neurologic:  Sedation - fent gt -> RASS - -3 . Moves all 4s - yes per RN. CAM-ICU - cannot test . Orientation - cannot test        Labs/imaging personally reviewed:  CT/CTA head 11/24 > negative.  CTA chest/abd/pelv 11/24 > negative for dissection or aneurysm; however, showed moderate to severe dilatation of the colon is noted with large amount of stool seen throughout the colo along with some wall thickening involving the sigmoid colon suggesting infectious or inflammatory colitis.  Assessment & Plan:    Acute respiratory failure secondary to septic shock and status post laparotomy -07/05/2022  07/07/2022 - > does NOT meet criteria for SBT/Extubation in setting of Acute Respiratory Failure due to shock   P:   Full ventilator support VAP bundle CXR as needed     Chronic back pain on meloxicam and tramadol Mild dementia on Aricept at home Depression/chronic pain on Cymbalta, Wellbutrin, Elavil and Xanax at home Presented with presyncope associated with straining at stool but code stroke ruled out  Acute abdominal pain at presentation -currently sedated on the ventilator  11/25 RASS -3 on fent gtt (off diprivan)  P:   Fentanyl infusion Fentanyl as needed Versed as needed Hold home Cymbalta, Wellbutrin, Elavil and Xanax and Aricept and meloxicam and tramadol RASS sedation score 0 to -2 if posisble     Hypertension at home on lisinopril and metoprolol Circulatory (septic) shock at admission  septic -colitis   P:  Hold home antihypertensives  MAP goal greater than 65 -  Levophed - Vasopressin    Normal echocardiogram September 2023 and normal troponin this admission Sinus rhythm  P: Monitor Telemetry Awaiting echo result from 07/06/22     Chronic  constipationn laxative Colitis without perforation present on CT scan at admission  and laparotomy 07/05/2022  11/26- made stool . On lactulose  P:   Zosyn 07/05/2022 - 07/05/2022 Flagyl 11/24>>  ceftriaxone 11/24 >> Awiat culture Start TF 112/6/23 (ok per CCS)   :  New onset AKI at admission 1 mg percent 07/05/2022 [baseline 0.7 mg percent] - peak 1.'33mg'$ % on 07/06/22  07/07/2022:  Improving   P:  Maintenance fluid Bicarbonate infusion Check ABG and Lactate and sif ok starrt weaning bic    Mild hypocalcemia - at admission  11.26 - again hypocalcemic and hypokalemic    P: Replete     Lactic acidosis admission  11/26 - still persistent  Plan  -Recheck    Chronic constipation Admitted with colitis 07/05/2022 -negative laparotomy findings other than c/w colitis   P:   NG tube to LIS -> can start Trickle feeds 07/07/22 Continue lactulose 07/06/22>   DIC : Rising INR >= 1.5 and d-dimer > 20  on 07/06/22 (normal Plat and Fibrinogen, No schistocytes) -  Plan  - moonitor - check doppler legs    New anemia mild 07/06/2022 - no bleeding . C/w anemia of critical illness   P:  - PRBC for hgb </= 6.9gm%    - exceptions are   -  if ACS susepcted/confirmed then transfuse for hgb </= 8.0gm%,  or    -  active bleeding with hemodynamic instability, then transfuse regardless of hemoglobin value   At at all times try to transfuse 1 unit prbc as possible with exception of active hemorrhage    At risk for hyperglcymia Hypothyroid    P:   Ssi Hold ysntrhoid due to colitis     Best practice (evaluated daily):  Diet/type: start trickle tf only 07/07/22 >> DVT prophylaxis: prophylactic heparin  GI prophylaxis: N/A Lines: N/A Foley:  N/A Code Status:  full code Last date of multidisciplinary goals of care discussion:    - 11/24 Updated daughter at bedside with RN present. DNR confirmed, OK with short term vasopressors. - 11/25 - same - 07/07/22 -  same . Son from [place] expected     [place]   The patient [name] is critically ill with multiple organ systems failure and requires high complexity decision making for assessment and support, frequent evaluation and titration of therapies, application of advanced monitoring technologies and extensive interpretation of multiple databases.   Critical Care Time devoted to patient care services described in this note is  32  Minutes. This time reflects time of care of this signee Dr Brand Males. This critical care time does not reflect procedure time, or teaching time or supervisory time of PA/NP/Med student/Med Resident etc but could involve care discussion time     Dr. Brand Males, M.D., [facility].C.P Pulmonary and Critical Care Medicine Medical Director - [facility] ICU Staff Physician, [place] [facility] Pager: [phone], If no answer or between  15:00h - 7:00h: call [phone]  07/07/2022 7:30 AM    LABS    PULMONARY Recent Labs  Lab 07/05/22 1953 07/05/22 2201 07/06/22 0513 07/06/22 1144 07/07/22 0449  PHART 7.179* 7.147* 7.260* 7.388 7.283*  PCO2ART 46.5 43.0 34.4 34.9 73.0*  PO2ART 259* 80* 74* 64* 77*  HCO3 17.4* 14.9* 15.3* 21.0 34.0*  TCO2 19* 16* 16* 22 36*  O2SAT 100 92 92 92 91      CBC Recent Labs  Lab 07/06/22 0312 07/06/22 0513 07/06/22 1120 07/06/22 1144 07/07/22 0323 07/07/22 0449  HGB 14.0   < > 11.6* 11.6* 11.0* 8.5*  HCT 44.1   < > 35.2* 34.0* 34.4* 25.0*  WBC 6.1  --  7.7  --  7.3  --   PLT 231  --  200  200  --  184  --    < > = values in this interval not displayed.    COAGULATION Recent Labs  Lab 07/05/22 1055 07/06/22 0021 07/06/22 1120 07/07/22 0323  INR 1.0 1.3* 1.5* 1.8*    CARDIAC  No results for input(s): "TROPONINI" in the last 168 hours. No results for input(s): "PROBNP" in the last 168 hours.   CHEMISTRY Recent Labs  Lab  07/05/22 2143 07/05/22 2201 07/06/22 0021 07/06/22 0312 07/06/22 0513 07/06/22 1120 07/06/22 1144 07/07/22 0323 07/07/22 0449  NA 137   < > 138 137 139 138 138 138 142  K 4.6   < > 3.9 3.4* 3.3* 3.3* 3.3* 3.6 3.1*  CL 111  --  110 106  --  102  --  97*  --   CO2 15*  --  17* 16*  --  23  --  28  --   GLUCOSE 237*  --  299* 313*  --  197*  --  162*  --   BUN 26*  --  28* 31*  --  34*  --  35*  --   CREATININE 1.02*  --  1.10* 1.33*  --  1.28*  --  1.19*  --   CALCIUM 7.5*  --  7.3* 7.2*  --  8.0*  --  7.2*  --   MG 2.5*  --   --  2.5*  --   --   --  2.9*  --   PHOS  --   --   --  3.8  --   --   --  5.1*  --    < > = values in this interval not displayed.   Estimated Creatinine Clearance: 35.9 mL/min (A) (by C-G formula based on SCr of 1.19 mg/dL (H)).   LIVER Recent Labs  Lab 07/05/22 1055 07/06/22 0021 07/06/22 1120 07/07/22 0323  AST 48* 55* 50* 59*  ALT 36 35 32 32  ALKPHOS 166* 218* 186* 210*  BILITOT 0.5 0.6 0.6 0.5  PROT 6.9 4.4* 4.6* 4.3*  ALBUMIN 3.9 2.3* 2.8* 2.4*  INR 1.0 1.3* 1.5* 1.8*     INFECTIOUS Recent Labs  Lab 07/06/22 1120 07/06/22 1925 07/07/22 0323  LATICACIDVEN 3.1* 2.5* 2.8*  PROCALCITON  --   --  31.94     ENDOCRINE CBG (last 3)  Recent Labs    07/06/22 1906 07/06/22 2306 07/07/22 0304  GLUCAP 134* 123* 141*         IMAGING x48h  - image(s) personally visualized  -   highlighted in bold DG Abd Portable 1V  Result Date: 07/06/2022 CLINICAL DATA:  Ileus EXAM: PORTABLE ABDOMEN - 1 VIEW COMPARISON:  None Available. FINDINGS: Nonobstructive pattern of small bowel gas. Gas-filled although not overtly distended colon in the right hemiabdomen. Esophagogastric tube with tip and side port below the diaphragm. No obvious free air in the abdomen on supine radiographs. Status post midline laparotomy. IMPRESSION: 1. Nonobstructive pattern of small bowel gas. Gas-filled although not overtly distended colon in the right hemiabdomen. 2.  Esophagogastric tube with tip and side port below the diaphragm. 3. Status post midline laparotomy. Electronically Signed   By: Delanna Ahmadi M.D.   On: 07/06/2022 14:32   DG Chest Portable 1 View  Result Date: 07/05/2022 CLINICAL DATA:  Central line placement. EXAM: PORTABLE CHEST 1 VIEW COMPARISON:  April 30, 2022. FINDINGS: The heart size and mediastinal contours are within normal limits. Interval placement of left internal jugular catheter with distal tip in expected position of the SVC. No pneumothorax is noted. Both lungs are clear. The visualized skeletal structures are unremarkable. IMPRESSION: Interval placement of left internal jugular catheter with distal tip in expected position of the SVC. Electronically Signed   By: Marijo Conception M.D.   On: 07/05/2022 17:42   CT Angio Chest/Abd/Pel for Dissection W and/or Wo Contrast  Result Date: 07/05/2022 CLINICAL DATA:  Acute aortic syndrome. EXAM: CT ANGIOGRAPHY CHEST, ABDOMEN AND PELVIS TECHNIQUE: Non-contrast CT of the chest was initially obtained. Multidetector CT imaging through the chest, abdomen and pelvis was performed using the standard protocol during bolus administration of intravenous contrast. Multiplanar reconstructed images and MIPs were obtained and reviewed to evaluate the  vascular anatomy. RADIATION DOSE REDUCTION: This exam was performed according to the departmental dose-optimization program which includes automated exposure control, adjustment of the mA and/or kV according to patient size and/or use of iterative reconstruction technique. CONTRAST:  52m OMNIPAQUE IOHEXOL 350 MG/ML SOLN COMPARISON:  May 16, 2020. FINDINGS: CTA CHEST FINDINGS Cardiovascular: Preferential opacification of the thoracic aorta. No evidence of thoracic aortic aneurysm or dissection. Normal heart size. No pericardial effusion. Mediastinum/Nodes: Moderate size sliding-type hiatal hernia. Thyroid gland is unremarkable. No adenopathy is noted.  Lungs/Pleura: No pneumothorax or pleural effusion is noted. Mild bibasilar subsegmental atelectasis is noted. Musculoskeletal: No chest wall abnormality. No acute or significant osseous findings. Review of the MIP images confirms the above findings. CTA ABDOMEN AND PELVIS FINDINGS VASCULAR Aorta: Normal caliber aorta without aneurysm, dissection, vasculitis or significant stenosis. Celiac: Patent without evidence of aneurysm, dissection, vasculitis or significant stenosis. SMA: Patent without evidence of aneurysm, dissection, vasculitis or significant stenosis. Renals: Both renal arteries are patent without evidence of aneurysm, dissection, vasculitis, fibromuscular dysplasia or significant stenosis. IMA: Patent without evidence of aneurysm, dissection, vasculitis or significant stenosis. Inflow: Patent without evidence of aneurysm, dissection, vasculitis or significant stenosis. Veins: No obvious venous abnormality within the limitations of this arterial phase study. Review of the MIP images confirms the above findings. NON-VASCULAR Hepatobiliary: No focal liver abnormality is seen. No gallstones, gallbladder wall thickening, or biliary dilatation. Pancreas: Unremarkable. No pancreatic ductal dilatation or surrounding inflammatory changes. Spleen: Normal in size without focal abnormality. Adrenals/Urinary Tract: Adrenal glands are unremarkable. Kidneys are normal, without renal calculi, focal lesion, or hydronephrosis. Bladder is unremarkable. Stomach/Bowel: The stomach is unremarkable. There is no small bowel dilatation. The colon appears to be moderately to severely dilated and full of stool. There appears to be some degree of wall thickening involving the sigmoid colon suggesting some degree of infectious or inflammatory colitis. Lymphatic: No adenopathy is noted. Reproductive: Status post hysterectomy. No adnexal masses. Other: No abdominal wall hernia or abnormality. No abdominopelvic ascites. Musculoskeletal:  No acute or significant osseous findings. Review of the MIP images confirms the above findings. IMPRESSION: No evidence of thoracic or abdominal aortic dissection or aneurysm. Moderate to severe dilatation of the colon is noted with large amount of stool seen throughout the colon. There appears to be some degree of wall thickening involving the sigmoid colon suggesting infectious or inflammatory colitis. Moderate size sliding-type hiatal hernia. Mild bibasilar subsegmental atelectasis. Electronically Signed   By: JMarijo ConceptionM.D.   On: 07/05/2022 13:33   CT ANGIO HEAD NECK W WO CM (CODE STROKE)  Result Date: 07/05/2022 CLINICAL DATA:  Left-sided weakness. EXAM: CT ANGIOGRAPHY HEAD AND NECK TECHNIQUE: Multidetector CT imaging of the head and neck was performed using the standard protocol during bolus administration of intravenous contrast. Multiplanar CT image reconstructions and MIPs were obtained to evaluate the vascular anatomy. Carotid stenosis measurements (when applicable) are obtained utilizing NASCET criteria, using the distal internal carotid diameter as the denominator. RADIATION DOSE REDUCTION: This exam was performed according to the departmental dose-optimization program which includes automated exposure control, adjustment of the mA and/or kV according to patient size and/or use of iterative reconstruction technique. CONTRAST:  717mOMNIPAQUE IOHEXOL 350 MG/ML SOLN COMPARISON:  Head and neck CTA 04/30/2022 FINDINGS: CTA NECK FINDINGS Aortic arch: Normal variant aortic arch branching pattern with common origin of the brachiocephalic and left common carotid arteries. Wide patency of the brachiocephalic and subclavian arteries. Right carotid system: Patent without evidence of a stenosis, dissection, or  significant atherosclerosis. Unchanged beading of the distal cervical ICA. Left carotid system: Patent with minimal atheromatous plaque noted in the carotid bulb. No evidence of a significant  stenosis or dissection. Unchanged beading of the distal cervical ICA. Vertebral arteries: Patent without evidence of a stenosis, dissection, or significant atherosclerosis. Strongly dominant right vertebral artery. Unchanged 4 mm inferiorly directed outpouching from the right V3 segment at the C1 level. Skeleton: Advanced disc degeneration at C5-6. Interbody ankylosis at C6-7. Advanced facet arthrosis with ankylosis at C4-5. 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 No evidence of cervical lymphadenopathy. Upper chest: No apical lung consolidation or mass. Review of the MIP images confirms the above findings CTA HEAD FINDINGS Anterior circulation: The internal carotid arteries are widely patent from skull base to carotid termini. ACAs and MCAs are patent without evidence of a proximal branch occlusion or significant proximal stenosis. No aneurysm is identified. Posterior circulation: The intracranial right vertebral artery is widely patent and supplies the basilar. The left vertebral artery is diminutive and poorly visualized distal to the PICA origin. Patent bilateral PICA, right AICA, and bilateral SCA origins are visualized. The basilar artery is widely patent. There are small right and large left posterior communicating arteries with hypoplasia of the left P1 segment. Both PCAs are patent without evidence of a significant proximal stenosis. No aneurysm is identified. Venous sinuses: Poorly evaluated due to arterial contrast timing. Anatomic variants: Predominantly fetal type origin of the left PCA. Review of the MIP images confirms the above findings These results were communicated to Dr. Leonel Ramsay at [place] am on 07/05/2022 by text page via the [facility] messaging system. IMPRESSION: 1. Minimal atherosclerosis without an emergent large vessel occlusion or significant proximal stenosis in the head or neck. 2. Unchanged beading of the cervical internal carotid arteries suggestive of  fibromuscular dysplasia. 3. 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 Electronically Signed   By: Logan Bores M.D.   On: 07/05/2022 11:38   CT HEAD CODE STROKE WO CONTRAST  Result Date: 07/05/2022 CLINICAL DATA:  Code stroke.  Left-sided weakness. EXAM: CT HEAD WITHOUT CONTRAST TECHNIQUE: Contiguous axial images were obtained from the base of the skull through the vertex without intravenous contrast. RADIATION DOSE REDUCTION: This exam was performed according to the departmental dose-optimization program which includes automated exposure control, adjustment of the mA and/or kV according to patient size and/or use of iterative reconstruction technique. COMPARISON:  Head CT and MRI 04/30/2022 FINDINGS: Brain: There is no evidence of an acute infarct, intracranial hemorrhage, mass, midline shift, or extra-axial fluid collection. The ventricles are normal in size. Hypodensities in the cerebral white matter bilaterally are unchanged and nonspecific but compatible with mild chronic small vessel ischemic disease. Vascular: Calcified atherosclerosis at the skull base. No hyperdense vessel. Skull: No fracture or suspicious osseous lesion. Small bilateral parietal skull osteomas. Sinuses/Orbits: Bilateral cataract extraction. Visualized paranasal sinuses and mastoid air cells are clear. Other: None. ASPECTS [facility] Stroke Program Early CT Score) - Ganglionic level infarction (caudate, lentiform nuclei, internal capsule, insula, M1-M3 cortex): 7 - Supraganglionic infarction (M4-M6 cortex): 3 Total score (0-10 with 10 being normal): 10 Preliminary findings were communicated by telephone to Dr. Leonel Ramsay on 07/05/2022 at 11:01 a.m. IMPRESSION: 1. No evidence of acute intracranial abnormality. ASPECTS of 10. 2. Mild chronic small vessel ischemic disease. Electronically Signed   By: Logan Bores M.D.   On: 07/05/2022 11:21

## 2022-07-08 DIAGNOSIS — A419 Sepsis, unspecified organism: Secondary | ICD-10-CM | POA: Diagnosis not present

## 2022-07-08 DIAGNOSIS — R6521 Severe sepsis with septic shock: Secondary | ICD-10-CM | POA: Diagnosis not present

## 2022-07-08 LAB — COMPREHENSIVE METABOLIC PANEL
ALT: 26 U/L (ref 0–44)
AST: 64 U/L — ABNORMAL HIGH (ref 15–41)
Albumin: 2.1 g/dL — ABNORMAL LOW (ref 3.5–5.0)
Alkaline Phosphatase: 188 U/L — ABNORMAL HIGH (ref 38–126)
Anion gap: 12 (ref 5–15)
BUN: 39 mg/dL — ABNORMAL HIGH (ref 8–23)
CO2: 27 mmol/L (ref 22–32)
Calcium: 7 mg/dL — ABNORMAL LOW (ref 8.9–10.3)
Chloride: 96 mmol/L — ABNORMAL LOW (ref 98–111)
Creatinine, Ser: 1.34 mg/dL — ABNORMAL HIGH (ref 0.44–1.00)
GFR, Estimated: 40 mL/min — ABNORMAL LOW (ref >60)
Glucose, Bld: 149 mg/dL — ABNORMAL HIGH (ref 70–99)
Potassium: 3.6 mmol/L (ref 3.5–5.1)
Sodium: 135 mmol/L (ref 135–145)
Total Bilirubin: 0.5 mg/dL (ref 0.3–1.2)
Total Protein: 4.3 g/dL — ABNORMAL LOW (ref 6.5–8.1)

## 2022-07-08 LAB — GLUCOSE, CAPILLARY
Glucose-Capillary: 133 mg/dL — ABNORMAL HIGH (ref 70–99)
Glucose-Capillary: 128 mg/dL — ABNORMAL HIGH (ref 70–99)
Glucose-Capillary: 129 mg/dL — ABNORMAL HIGH (ref 70–99)
Glucose-Capillary: 135 mg/dL — ABNORMAL HIGH (ref 70–99)
Glucose-Capillary: 126 mg/dL — ABNORMAL HIGH (ref 70–99)

## 2022-07-08 LAB — CBC
HCT: 31.7 % — ABNORMAL LOW (ref 36.0–46.0)
Hemoglobin: 10.2 g/dL — ABNORMAL LOW (ref 12.0–15.0)
MCH: 30.9 pg (ref 26.0–34.0)
MCHC: 32.2 g/dL (ref 30.0–36.0)
MCV: 96.1 fL (ref 80.0–100.0)
Platelets: 173 10*3/uL (ref 150–400)
RBC: 3.3 MIL/uL — ABNORMAL LOW (ref 3.87–5.11)
RDW: 14.2 % (ref 11.5–15.5)
WBC: 9.5 10*3/uL (ref 4.0–10.5)
nRBC: 0 % (ref 0.0–0.2)

## 2022-07-08 LAB — PROTIME-INR
INR: 1.3 — ABNORMAL HIGH (ref 0.8–1.2)
Prothrombin Time: 15.7 seconds — ABNORMAL HIGH (ref 11.4–15.2)

## 2022-07-08 LAB — MAGNESIUM
Magnesium: 2.8 mg/dL — ABNORMAL HIGH (ref 1.7–2.4)
Magnesium: 3.1 mg/dL — ABNORMAL HIGH (ref 1.7–2.4)

## 2022-07-08 LAB — PHOSPHORUS
Phosphorus: 4.2 mg/dL (ref 2.5–4.6)
Phosphorus: 3.3 mg/dL (ref 2.5–4.6)

## 2022-07-08 LAB — CK TOTAL AND CKMB (NOT AT ARMC)
CK, MB: 4.1 ng/mL (ref 0.5–5.0)
Relative Index: 1.4 (ref 0.0–2.5)
Total CK: 295 U/L — ABNORMAL HIGH (ref 38–234)

## 2022-07-08 LAB — TROPONIN I (HIGH SENSITIVITY): Troponin I (High Sensitivity): 55 ng/L — ABNORMAL HIGH (ref <18)

## 2022-07-08 LAB — LACTIC ACID, PLASMA: Lactic Acid, Venous: 3.1 mmol/L (ref 0.5–1.9)

## 2022-07-08 MED ORDER — LACTATED RINGERS IV SOLN: INTRAVENOUS | Status: AC

## 2022-07-08 MED ORDER — LACTATED RINGERS IV BOLUS
500.0000 mL | Freq: Once | INTRAVENOUS | Status: AC
  Administered 2022-07-08: 500 mL via INTRAVENOUS

## 2022-07-08 MED ORDER — POTASSIUM CHLORIDE 10 MEQ/50ML IV SOLN
10.0000 meq | INTRAVENOUS | Status: AC
  Administered 2022-07-08 (×4): 10 meq via INTRAVENOUS
  Filled 2022-07-08 (×4): qty 50

## 2022-07-08 NOTE — Progress Notes (Signed)
3 Days Post-Op   Subjective/Chief Complaint: Intubated, sedated, on pressors, levo and vaso.  Small smears of bowel function.  Tolerating trickle TFs   Objective: Vital signs in last 24 hours: Temp:  [99.1 F (37.3 C)-100.4 F (38 C)] 99.9 F (37.7 C) (11/27 0800) Pulse Rate:  [68-154] 90 (11/27 0800) Resp:  [18-32] 21 (11/27 0800) BP: (83-125)/(34-92) 124/57 (11/27 0749) SpO2:  [84 %-100 %] 96 % (11/27 0800) Arterial Line BP: (89-150)/(48-67) 131/59 (11/27 0800) FiO2 (%):  [40 %-50 %] 40 % (11/27 0749) Last BM Date : 07/07/22  Intake/Output from previous day: 11/26 0701 - 11/27 0700 In: 3874.2 [I.V.:2891.4; NG/GT:254; IV Piggyback:728.8] Out: 975 [Urine:975] Intake/Output this shift: Total I/O In: 88.1 [I.V.:68.1; NG/GT:20] Out: 125 [Urine:125]  Abdomen: soft, wound without infection, some drainage from distal aspect likely secondary to anasarca. Some distention/bloating  Lab Results:  Recent Labs    07/07/22 0323 07/07/22 0449 07/07/22 2125 07/08/22 0400  WBC 7.3  --   --  9.5  HGB 11.0*   < > 10.5* 10.2*  HCT 34.4*   < > 31.0* 31.7*  PLT 184  --   --  173   < > = values in this interval not displayed.   BMET Recent Labs    07/07/22 0323 07/07/22 0449 07/07/22 2125 07/08/22 0400  NA 138   < > 135 135  K 3.6   < > 3.7 3.6  CL 97*  --   --  96*  CO2 28  --   --  27  GLUCOSE 162*  --   --  149*  BUN 35*  --   --  39*  CREATININE 1.19*  --   --  1.34*  CALCIUM 7.2*  --   --  7.0*   < > = values in this interval not displayed.   PT/INR Recent Labs    07/07/22 0323 07/08/22 0400  LABPROT 20.8* 15.7*  INR 1.8* 1.3*   ABG Recent Labs    07/07/22 1743 07/07/22 2125  PHART 7.389 7.398  HCO3 30.5* 30.7*    Studies/Results: VAS [place] LOWER EXTREMITY VENOUS (DVT)  Result Date: 07/07/2022  Lower Venous DVT Study Patient Name:  [name]  Date of Exam:   07/07/2022 Medical Rec #: [id number]        Accession #:    [id number] Date of Birth:  [birth year]        Patient Gender: F Patient Age:   81 years Exam Location:  [facility] Procedure:      VAS [place] LOWER EXTREMITY VENOUS (DVT) Referring Phys: MURALI RAMASWAMY --------------------------------------------------------------------------------  Indications: Edema.  Risk Factors: None identified. Limitations: Poor ultrasound/tissue interface and patient positioning, patient immobility. Comparison Study: No prior studies. Performing Technologist: Oliver Hum RVT  Examination Guidelines: A complete evaluation includes B-mode imaging, spectral Doppler, color Doppler, and power Doppler as needed of all accessible portions of each vessel. Bilateral testing is considered an integral part of a complete examination. Limited examinations for reoccurring indications may be performed as noted. The reflux portion of the exam is performed with the patient in reverse Trendelenburg.  +---------+---------------+---------+-----------+----------+--------------+ RIGHT    CompressibilityPhasicitySpontaneityPropertiesThrombus Aging +---------+---------------+---------+-----------+----------+--------------+ CFV      Full           Yes      Yes                                 +---------+---------------+---------+-----------+----------+--------------+  SFJ      Full                                                        +---------+---------------+---------+-----------+----------+--------------+ FV Prox  Full                                                        +---------+---------------+---------+-----------+----------+--------------+ FV Mid                  Yes      Yes                                 +---------+---------------+---------+-----------+----------+--------------+ FV Distal               Yes      Yes                                 +---------+---------------+---------+-----------+----------+--------------+ PFV      Full                                                         +---------+---------------+---------+-----------+----------+--------------+ POP      Full           Yes      Yes                                 +---------+---------------+---------+-----------+----------+--------------+ PTV      Full                                                        +---------+---------------+---------+-----------+----------+--------------+ PERO     Full                                                        +---------+---------------+---------+-----------+----------+--------------+   +---------+---------------+---------+-----------+----------+--------------+ LEFT     CompressibilityPhasicitySpontaneityPropertiesThrombus Aging +---------+---------------+---------+-----------+----------+--------------+ CFV      Full           Yes      Yes                                 +---------+---------------+---------+-----------+----------+--------------+ SFJ      Full                                                        +---------+---------------+---------+-----------+----------+--------------+  FV Prox  Full                                                        +---------+---------------+---------+-----------+----------+--------------+ FV Mid                  Yes      Yes                                 +---------+---------------+---------+-----------+----------+--------------+ FV DistalFull                                                        +---------+---------------+---------+-----------+----------+--------------+ PFV      Full                                                        +---------+---------------+---------+-----------+----------+--------------+ POP      Full           Yes      Yes                                 +---------+---------------+---------+-----------+----------+--------------+ PTV      Full                                                         +---------+---------------+---------+-----------+----------+--------------+ PERO     Full                                                        +---------+---------------+---------+-----------+----------+--------------+     Summary: RIGHT: - There is no evidence of deep vein thrombosis in the lower extremity. However, portions of this examination were limited- see technologist comments above.  - No cystic structure found in the popliteal fossa.  LEFT: - There is no evidence of deep vein thrombosis in the lower extremity. However, portions of this examination were limited- see technologist comments above.  - No cystic structure found in the popliteal fossa.  *See table(s) above for measurements and observations. Electronically signed by Harold Barban MD on 07/07/2022 at 12:45:35 PM.    Final    DG Abd Portable 1V  Result Date: 07/06/2022 CLINICAL DATA:  Ileus EXAM: PORTABLE ABDOMEN - 1 VIEW COMPARISON:  None Available. FINDINGS: Nonobstructive pattern of small bowel gas. Gas-filled although not overtly distended colon in the right hemiabdomen. Esophagogastric tube with tip and side port below the diaphragm. No obvious free air in the abdomen on supine radiographs. Status post midline laparotomy. IMPRESSION: 1. Nonobstructive pattern of small bowel gas. Gas-filled although not overtly  distended colon in the right hemiabdomen. 2. Esophagogastric tube with tip and side port below the diaphragm. 3. Status post midline laparotomy. Electronically Signed   By: Delanna Ahmadi M.D.   On: 07/06/2022 14:32    Anti-infectives: Anti-infectives (From admission, onward)    Start     Dose/Rate Route Frequency Ordered Stop   07/06/22 1230  cefTRIAXone (ROCEPHIN) 2 g in sodium chloride 0.9 % 100 mL IVPB        2 g 200 mL/hr over 30 Minutes Intravenous Every 24 hours 07/06/22 1131     07/05/22 1830  piperacillin-tazobactam (ZOSYN) IVPB 3.375 g        3.375 g 100 mL/hr over 30 Minutes Intravenous  Once 07/05/22  1827 07/05/22 1905   07/05/22 1345  cefTRIAXone (ROCEPHIN) 2 g in sodium chloride 0.9 % 100 mL IVPB        2 g 200 mL/hr over 30 Minutes Intravenous  Once 07/05/22 1341 07/05/22 1435   07/05/22 1345  metroNIDAZOLE (FLAGYL) IVPB 500 mg        500 mg 100 mL/hr over 60 Minutes Intravenous Every 12 hours 07/05/22 1341         Assessment/Plan: POD 3, s/p elap (negative)- CW 11/24 -elap for lactic acidosis, pressors concern for ischemia was negative -can cont trickle tube feeds today but would hold on advancement due to minimal bowel function but also secondary to increasing distention and persistent pressor needs. -likely source of sepsis is infectious and lab derangements all associated -will follow with you  FEN - NPO, TFs, IVFs per primary VTE - heparin ID - Rocephin/Flagyl   Henreitta Cea 07/08/2022

## 2022-07-08 NOTE — Progress Notes (Signed)
Initial Nutrition Assessment  DOCUMENTATION CODES:   Not applicable  INTERVENTION:  Continue current regimen while trickles are requested to maximize protein infusion. Vital High Protein at 26m/h with Prosource TF 1x/d When able to advance, recommend the following: Vital AF 1.2 @ 638mh (1.44L/d) This will provide 1728 kcal, 108g of protein, and [id number] free water  NUTRITION DIAGNOSIS:   Increased nutrient needs related to post-op healing as evidenced by estimated needs.  GOAL:   Patient will meet greater than or equal to 90% of their needs  MONITOR:   Labs, Vent status, Weight trends, I & O's, Skin, TF tolerance  REASON FOR ASSESSMENT:   Consult Enteral/tube feeding initiation and management  ASSESSMENT:   Pt with hx of HTN, chronic constipation, PUD, PVD, GERD, and hx renal caner presented to ED after a near syncopal episode and worsening abdominal pain  11/24 - Op, Ex Lap (no significant findings aside from large stool burden), returned to ICU on Vent   Patient is currently intubated on ventilator support. NGT in place (gastric) with trickles of Vital High Protein infusing at 68m14m Surgery recommends only trickles today as pt is still requiring pressor support and abdomen is more distended. Will re-evaluate ability to increase rate. Discussed in rounds and with CCM resident.   RN reports pressor needs are decreasing.   MV: 7.2 L/min Temp (24hrs), Avg:100 F (37.8 C), Min:99.5 F (37.5 C), Max:100.4 F (38 C)   Intake/Output Summary (Last 24 hours) at 07/08/2022 1302 Last data filed at 07/08/2022 1200 Gross per 24 hour  Intake 3341.43 ml  Output 1060 ml  Net 2281.43 ml  Net IO Since Admission: 15,189.47 mL [07/08/22 1302]  Nutritionally Relevant Medications: Scheduled Meds:  PROSource TF20  60 mL Per Tube Daily   VITAL HIGH PROTEIN  1,000 mL Per Tube Q24H   insulin aspart  0-15 Units Subcutaneous Q4H   lactulose  30 g Per Tube TID   pantoprazole IV   40 mg Intravenous Q12H   Continuous Infusions:  norepinephrine (LEVOPHED) Adult infusion 5 mcg/min (07/08/22 0900)   vasopressin 0.03 Units/min (07/08/22 0055)   Labs Reviewed: Chloride 96 BUN 39, creatinine 1.34 Mg 2.8 CBG ranges from 111-133 mg/dL over the last 24 hours HgbA1c 5.7% (9/19)  NUTRITION - FOCUSED PHYSICAL EXAM: Defer to follow-up, several providers in room at the time of assessment  Diet Order:   Diet Order             Diet NPO time specified  Diet effective now                   EDUCATION NEEDS:   Not appropriate for education at this time  Skin:  Skin Assessment: Reviewed RN Assessment (midline incision)  Last BM:  11/26 - type 5  Height:   Ht Readings from Last 1 Encounters:  06/27/22 '5\' 2"'$  (1.575 m)    Weight:   Wt Readings from Last 1 Encounters:  07/06/22 78.2 kg    Ideal Body Weight:  50 kg  BMI:  Body mass index is 31.53 kg/m.  Estimated Nutritional Needs:  Kcal:  1700-1900 kcal/d Protein:  100-115 g/d Fluid:  1.8-2L/d   RachRanell Patrick, LDN Clinical Dietitian RD pager # available in [facility] hours/weekend pager # available in [facility]

## 2022-07-08 NOTE — Progress Notes (Signed)
NAME:  [name], MRN:  [id number], DOB:  07/14/1941, LOS: 3 ADMISSION DATE:  07/05/2022  History of Present Illness:  28 F with chronic constipation presents with abdominal pain after syncopal episode during straining to defecate. Hypotensive with lactic acidosis, found to have colitis. Admitted to ICU for septic shock on pressors.  Significant Hospital Events:  11/24 > admit. [facility] -plap to OPr -Dr Dema Severin- > normal findings. Retrun on Vent and contijued shock             - CVL in ER             - A Line             - urine culture             - blood culture             - Mrsa pcr - neg   11/25 - -normal findings in the operating room yesterday.  Currently still remains on the ventilator 40% FiO2 but in refractory shock with Levophed 4 mcg and also vasopressin and stress dose.  Making urine.  Remains on bicarbonate drip with rising lactic acidosis.  On Flagyl antibiotic.  Daughter [name] at the bedside and is concerned that patient might not survive this illness.  11/26 - vent 40% fio2, fent gtt, vaso gtt, levophed gtt, bic gtt +. Made urine. AKI better Fever improved. PCT > 30 but lactate still high > 2. CK normal. Lactulose overnight -> 1 stool. This AM - ccs ok with laxatives and tube feeds   Trop 47 Lipase normal   Echo - pending results Duplex LE - pending  11/24 > current ceftriaxone 11/24 > current metronidazole 11/24 zosyn  Interim History / Subjective:  Fever to 100.4 F. Still with severe abdominal pain. No significant BM in a couple of days.  Objective   Blood pressure (!) 124/57, pulse 91, temperature 100 F (37.8 C), resp. rate (!) 21, weight 78.2 kg, SpO2 95 %.    Vent Mode: PRVC FiO2 (%):  [40 %-50 %] 40 % Set Rate:  [20 bmp] 20 bmp Vt Set:  [400 mL] 400 mL PEEP:  [8 cmH20] 8 cmH20 Plateau Pressure:  [20 cmH20-23 cmH20] 23 cmH20   Intake/Output Summary (Last 24 hours) at 07/08/2022 0811 Last data filed at 07/08/2022 0700 Gross per 24 hour   Intake 3701.42 ml  Output 975 ml  Net 2726.42 ml   Filed Weights   07/05/22 1000 07/06/22 0500  Weight: 68.8 kg 78.2 kg    Examination: Intubated and alert L IJ CVC Regular rate and rhythm Ventilated at rate of 20, lungs clear to auscultation Abdomen is distended and tender to percussion Skin is warm and dry Alert and follows simple instructions  Labs: Na 135 K 3.6 CO2 27 BUN 39 Cr 1.34 increasing  CK 295 Trop HS 55 Lactate 3.1 Procal 31.94  PT 16.7 INR 1.3  WBC 9.5 Hgb 10.2 Plt 173 MCV 96.1  Bcx 11/24 NG x3d Ucx 11/25 NG final  Imaging: 11/24 CT chest/abdomen/pelvis - sigmoid colonic wall thickening with dilatation and large stool burden  Resolved Hospital Problem list   Resolved Problems:   * No resolved hospital problems. *   Assessment & Plan:  Principal Problem:   Septic shock ([place]) Active Problems:   Hypovolemia  Septic shock Refractory to IVF. Requiring 2 pressor support. - Norepinephrine and vasopressin for MAP > 65  Afib with RVR Secondary to critical illness. Sinus  rhythm and stable today. - amiodarone infusion  Acute hypoxemic respiratory failure Failed ventilator wean today. - Continue PRVC  Worsening AKI Cr 1.34 and increasing. Secondary to refractory shock in setting of sepsis. +15 L during admission. - LR 75 cc/h IV  Severe constipation Bowel obstruction Stercoral versus infectious colitis No acute findings on exploratory laparotomy 11/24. No significant BM over the weekend. Tolerating trickle TF. BP stable on low-dose norepinephrine and vasopressin. Still with significant pain and concerning abdominal exam. Labs notable for elevated but stable lactate. - ceftriaxone + flagyl - trickle TF - appreciate surgery recommendations  Normocytic anemia Secondary to microscopic blood loss from colitis and critical illness marrow suppression. Hgb stable today.  Hyperglycemia Glucose to 313 during admission after intraoperative  dexamethasone. No history of diabetes. 9/23 A1c 5.7. - SSI  Best practice (daily eval):  Diet/type: tubefeeds - trickle DVT prophylaxis: prophylactic heparin  GI prophylaxis: PPI Lines: Central line Foley:  Yes, and it is still needed Code Status:  DNR  Nani Gasser MD 07/08/2022, 8:11 AM  Pager: [phone]

## 2022-07-08 NOTE — Progress Notes (Incomplete)
Attending note: I have seen and examined the patient. History, labs and imaging reviewed.  81 Y/O with lactic acidosis, shock. S.p ex lap out of for concern of bowel perf but did not find any abnormalities Has low grade fevers. Continues on norepi  Blood pressure (!) 124/57, pulse 86, temperature 99.9 F (37.7 C), resp. rate 20, weight 78.2 kg, SpO2 97 %. Gen:      No acute distress HEENT:  EOMI, sclera anicteric Neck:     No masses; no thyromegaly, ETT Lungs:    Clear to auscultation bilaterally; normal respiratory effort CV:         Regular rate and rhythm; no murmurs Abd:     Distended, soft Ext:  Abd tenderness Skin:      Warm and dry; no rash Neuro: Sedated  Labs/Imaging personally reviewed, significant for K 3.6, BUN/Cr 39/1.34, Alk phos 188 LA 3.1, WBC 9.5, Hb 10.2, plts 173  Assessment/plan: Acute resp failure Wean PEEP. Fio2 as tolerated  AKI, cr higher today Give fluids Monitor labs  Colitis Persistent lactic acidosis Surgery is following Additiona LR bolus and drip at 75 cc/hr Trickle feeds per surgery Continue abd exams  The patient is critically ill with multiple organ systems failure and requires high complexity decision making for assessment and support, frequent evaluation and titration of therapies, application of advanced monitoring technologies and extensive interpretation of multiple databases.  Critical care time - 35 mins. This represents my time independent of the NPs time taking care of the pt.  Marshell Garfinkel MD [facility] 07/08/2022, 10:09 AM

## 2022-07-09 DIAGNOSIS — R6521 Severe sepsis with septic shock: Secondary | ICD-10-CM | POA: Diagnosis not present

## 2022-07-09 DIAGNOSIS — A419 Sepsis, unspecified organism: Secondary | ICD-10-CM | POA: Diagnosis not present

## 2022-07-09 LAB — GLUCOSE, CAPILLARY
Glucose-Capillary: 112 mg/dL — ABNORMAL HIGH (ref 70–99)
Glucose-Capillary: 133 mg/dL — ABNORMAL HIGH (ref 70–99)
Glucose-Capillary: 131 mg/dL — ABNORMAL HIGH (ref 70–99)
Glucose-Capillary: 108 mg/dL — ABNORMAL HIGH (ref 70–99)
Glucose-Capillary: 102 mg/dL — ABNORMAL HIGH (ref 70–99)
Glucose-Capillary: 126 mg/dL — ABNORMAL HIGH (ref 70–99)
Glucose-Capillary: 95 mg/dL (ref 70–99)

## 2022-07-09 LAB — COMPREHENSIVE METABOLIC PANEL
ALT: 21 U/L (ref 0–44)
AST: 59 U/L — ABNORMAL HIGH (ref 15–41)
Albumin: 1.8 g/dL — ABNORMAL LOW (ref 3.5–5.0)
Alkaline Phosphatase: 182 U/L — ABNORMAL HIGH (ref 38–126)
Anion gap: 10 (ref 5–15)
BUN: 51 mg/dL — ABNORMAL HIGH (ref 8–23)
CO2: 26 mmol/L (ref 22–32)
Calcium: 7.1 mg/dL — ABNORMAL LOW (ref 8.9–10.3)
Chloride: 100 mmol/L (ref 98–111)
Creatinine, Ser: 1.52 mg/dL — ABNORMAL HIGH (ref 0.44–1.00)
GFR, Estimated: 34 mL/min — ABNORMAL LOW (ref >60)
Glucose, Bld: 130 mg/dL — ABNORMAL HIGH (ref 70–99)
Potassium: 3.2 mmol/L — ABNORMAL LOW (ref 3.5–5.1)
Sodium: 136 mmol/L (ref 135–145)
Total Bilirubin: <0.1 mg/dL — ABNORMAL LOW (ref 0.3–1.2)
Total Protein: 4.1 g/dL — ABNORMAL LOW (ref 6.5–8.1)

## 2022-07-09 LAB — CBC
HCT: 27.3 % — ABNORMAL LOW (ref 36.0–46.0)
Hemoglobin: 9.1 g/dL — ABNORMAL LOW (ref 12.0–15.0)
MCH: 31.6 pg (ref 26.0–34.0)
MCHC: 33.3 g/dL (ref 30.0–36.0)
MCV: 94.8 fL (ref 80.0–100.0)
Platelets: 145 10*3/uL — ABNORMAL LOW (ref 150–400)
RBC: 2.88 MIL/uL — ABNORMAL LOW (ref 3.87–5.11)
RDW: 14.5 % (ref 11.5–15.5)
WBC: 5.4 10*3/uL (ref 4.0–10.5)
nRBC: 0 % (ref 0.0–0.2)

## 2022-07-09 LAB — MAGNESIUM: Magnesium: 3.2 mg/dL — ABNORMAL HIGH (ref 1.7–2.4)

## 2022-07-09 LAB — LACTIC ACID, PLASMA
Lactic Acid, Venous: 1.6 mmol/L (ref 0.5–1.9)
Lactic Acid, Venous: 1.3 mmol/L (ref 0.5–1.9)

## 2022-07-09 LAB — PHOSPHORUS: Phosphorus: 3.5 mg/dL (ref 2.5–4.6)

## 2022-07-09 MED ORDER — POTASSIUM CHLORIDE 10 MEQ/50ML IV SOLN
10.0000 meq | INTRAVENOUS | Status: AC
  Administered 2022-07-09 (×4): 10 meq via INTRAVENOUS
  Filled 2022-07-09 (×4): qty 50

## 2022-07-09 MED ORDER — FUROSEMIDE 10 MG/ML IJ SOLN
20.0000 mg | Freq: Once | INTRAMUSCULAR | Status: AC
  Administered 2022-07-09: 20 mg via INTRAVENOUS
  Filled 2022-07-09: qty 2

## 2022-07-09 MED ORDER — VITAL AF 1.2 CAL PO LIQD: 1000.0000 mL | ORAL | Status: DC

## 2022-07-09 MED ORDER — ALPRAZOLAM 0.25 MG PO TABS
0.5000 mg | ORAL_TABLET | Freq: Two times a day (BID) | ORAL | Status: DC | PRN
  Administered 2022-07-09 – 2022-07-16 (×10): 0.5 mg via ORAL
  Filled 2022-07-09 (×4): qty 2
  Filled 2022-07-09 (×2): qty 1
  Filled 2022-07-09 (×4): qty 2

## 2022-07-09 MED ORDER — ORAL CARE MOUTH RINSE
15.0000 mL | OROMUCOSAL | Status: DC
  Administered 2022-07-10 – 2022-07-16 (×24): 15 mL via OROMUCOSAL

## 2022-07-09 MED ORDER — ORAL CARE MOUTH RINSE: 15.0000 mL | OROMUCOSAL | Status: DC | PRN

## 2022-07-09 MED ORDER — POTASSIUM CHLORIDE 20 MEQ PO PACK
20.0000 meq | PACK | ORAL | Status: AC
  Administered 2022-07-09 (×2): 20 meq
  Filled 2022-07-09 (×2): qty 1

## 2022-07-09 MED ORDER — PANTOPRAZOLE SODIUM 40 MG IV SOLR: 40.0000 mg | Freq: Every day | INTRAVENOUS | Status: DC

## 2022-07-09 MED ORDER — OXYCODONE HCL 5 MG PO TABS
5.0000 mg | ORAL_TABLET | Freq: Four times a day (QID) | ORAL | Status: DC | PRN
  Administered 2022-07-10 – 2022-07-16 (×12): 5 mg via ORAL
  Filled 2022-07-09 (×12): qty 1

## 2022-07-09 MED ORDER — ACETAMINOPHEN 500 MG PO TABS
1000.0000 mg | ORAL_TABLET | Freq: Three times a day (TID) | ORAL | Status: DC
  Administered 2022-07-09 – 2022-07-16 (×21): 1000 mg via ORAL
  Filled 2022-07-09 (×21): qty 2

## 2022-07-09 NOTE — Progress Notes (Signed)
NAME:  [name], MRN:  [id number], DOB:  09/22/1940, LOS: 4 ADMISSION DATE:  07/05/2022  History of Present Illness:  84 F with chronic constipation presents with abdominal pain after syncopal episode during straining to defecate. Hypotensive with lactic acidosis, found to have colitis. Admitted to ICU for septic shock on pressors. Exploratory laparotomy with normal findings.  Significant Hospital Events:  11/24 > admit. [facility] -plap to OPr -Dr Dema Severin- > normal findings. Retrun on Vent and contijued shock             - CVL in ER             - A Line             - urine culture             - blood culture             - Mrsa pcr - neg   11/25 - -normal findings in the operating room yesterday.  Currently still remains on the ventilator 40% FiO2 but in refractory shock with Levophed 4 mcg and also vasopressin and stress dose.  Making urine.  Remains on bicarbonate drip with rising lactic acidosis.  On Flagyl antibiotic.  Daughter [name] at the bedside and is concerned that patient might not survive this illness.   11/26 - vent 40% fio2, fent gtt, vaso gtt, levophed gtt, bic gtt +. Made urine. AKI better Fever improved. PCT > 30 but lactate still high > 2. CK normal. Lactulose overnight -> 1 stool. This AM - ccs ok with laxatives and tube feeds   Trop 47 Lipase normal   Echo - pending results Duplex LE - pending   11/24 > current ceftriaxone 11/24 > current metronidazole 11/24 zosyn  Interim History / Subjective:  Fever to 100.6 F overnight.  Alert but does not respond to questions or follow simple instructions.  Objective   Blood pressure (!) 124/57, pulse 92, temperature 99.5 F (37.5 C), resp. rate (!) 21, weight 78.2 kg, SpO2 97 %.    Vent Mode: PRVC FiO2 (%):  [40 %-50 %] 40 % Set Rate:  [20 bmp] 20 bmp Vt Set:  [400 mL] 400 mL PEEP:  [8 cmH20] 8 cmH20 Plateau Pressure:  [20 cmH20-23 cmH20] 20 cmH20   Intake/Output Summary (Last 24 hours) at 07/09/2022  0718 Last data filed at 07/09/2022 0400 Gross per 24 hour  Intake 3570.33 ml  Output 885 ml  Net 2685.33 ml  Net +17,812 Filed Weights   07/05/22 1000 07/06/22 0500  Weight: 68.8 kg 78.2 kg    Examination: Intubated and sedated. Regular rate and rhythm.  Bilateral radial pulses 2+.  JVD. 2+ lower extremity edema, left more than right. ETT in place.  Ventilated at a rate of 20.  Bilateral anterior lung fields clear. Abdomen is distended and firm.  Laparotomy incision with nonbloody serous drainage and clean bandage.  Tympanic to percussion all fields.  Nontender. Skin is warm and dry. Alert to verbal.  PERRL.  Does not follow simple instructions or respond to questions.  Labs: Sodium 136 Potassium 3.2 CO2 26  BUN 51, increasing Creatinine 1.52, increasing  WBC 5.4 Hemoglobin 9.1 decreasing Platelets 145, decreasing  11/24 blood cultures negative 11/25 urine cultures negative  Imaging: No new images  Resolved Hospital Problem list   Resolved Problems:   * No resolved hospital problems. *   Assessment & Plan:  Principal Problem:   Septic shock ([place]) Active Problems:  Hypovolemia  Worsening AKI Volume overload Cr increasing again today. Secondary to refractory shock in setting of sepsis, but shock now improved. +15 L during admission. Now with signs of volume overload including JVD. Likely due in part to venous congestion at this point. Will begin diuresis - Dc LR 75 cc/h IV - Lasix 20 mg IV once - F/u afternoon UOP   Severe constipation Bowel obstruction Stercoral versus infectious colitis No acute findings on exploratory laparotomy 11/24. Diarrhea today. Abdominal exam improved somewhat. Will cautiously advance TF as now that patient is extubated, airway is unprotected. - ceftriaxone + flagyl - Advance TF slowly, appreciate dietitian assistance - appreciate surgery recommendations  Acute hypoxemic respiratory failure, improving Weaned all morning.  Extubated today and protecting airway.  Septic shock, improving Off pressors for 2 days. -Dc'd pressors   Afib with RVR, resolved Secondary to critical illness and major surgery. Sinus rhythm and stable today. No anticoagulation given paroxysmal nature in setting of acute critical illness. - Dc'd amiodarone  Hypokalemia Potassium 3.2. - Replaced   Normocytic anemia Hgb decrease with concomitant decrease in platelets. Likely secondary to ABLA and critical illness preventing replacement. - Transfuse for hgb < 7   Hyperglycemia Glucose to 313 during admission after intraoperative dexamethasone. No history of diabetes. 9/23 A1c 5.7. - SSI  Best practice (daily eval):  Diet/type: tubefeeds DVT prophylaxis: prophylactic heparin  GI prophylaxis: PPI Lines: Central line and Arterial Line Foley:  Yes, and it is still needed Code Status:  DNR  Nani Gasser MD 07/09/2022, 7:18 AM  Pager: [phone]

## 2022-07-09 NOTE — Progress Notes (Signed)
Brief Nutrition Support Note  Pt extubated this AM with NGT in place with TF still infusing at 87m/h. Discussed with MD, ok to start slowly advancing TF as long as pt is stable for several hours post-extubation. Adjust TF orders to the following:  Vital AF 1.2 @ 643mh (1.44L/d) Start at 30 and advance by 1064m8h to goal This will provide 1728 kcal, 108g of protein, and 1168m17m free water  [name], LDN Clinical Dietitian RD pager # available in [facility] hours/weekend pager # available in [facility]

## 2022-07-09 NOTE — Progress Notes (Signed)
[facility] ADULT ICU REPLACEMENT PROTOCOL   The patient does apply for the [facility] Adult ICU Electrolyte Replacment Protocol based on the criteria listed below:   1.Exclusion criteria: TCTS, ECMO, Dialysis, and Myasthenia Gravis patients 2. Is GFR >/= 30 ml/min? Yes.    Patient's GFR today is 34 3. Is SCr </= 2? Yes.   Patient's SCr is 1.52 mg/dL 4. Did SCr increase >/= 0.5 in 24 hours? No. 5.Pt's weight >40kg  Yes.   6. Abnormal electrolyte(s): K+3.2  7. Electrolytes replaced per protocol 8.  Call MD STAT for K+ </= 2.5, Phos </= 1, or Mag </= 1 Physician:  Dr. Nicki [place], [place] Grumbling 07/09/2022 6:24 AM

## 2022-07-09 NOTE — Progress Notes (Signed)
4 Days Post-Op   Subjective/Chief Complaint: Intubated, weaning.  Having copious bowel functions.  Off all pressors.  Tolerating trickle TFs still.   Objective: Vital signs in last 24 hours: Temp:  [98.2 F (36.8 C)-100.6 F (38.1 C)] 98.4 F (36.9 C) (11/28 0745) Pulse Rate:  [78-105] 94 (11/28 0745) Resp:  [15-23] 15 (11/28 0745) BP: (154)/(47) 154/47 (11/28 0742) SpO2:  [84 %-100 %] 94 % (11/28 0745) Arterial Line BP: (96-168)/(43-66) 159/51 (11/28 0745) FiO2 (%):  [40 %-50 %] 40 % (11/28 0742) Last BM Date : 07/09/22  Intake/Output from previous day: 11/27 0701 - 11/28 0700 In: 3957.9 [I.V.:2554.2; NG/GT:480; IV Piggyback:923.7] Out: 885 [Urine:885] Intake/Output this shift: No intake/output data recorded.  Abdomen: soft, wound without infection, some drainage from distal aspect likely secondary to anasarca. Some distention, but this is from anasarca as opposed to bowel distention is seems.    Lab Results:  Recent Labs    07/08/22 0400 07/09/22 0406  WBC 9.5 5.4  HGB 10.2* 9.1*  HCT 31.7* 27.3*  PLT 173 145*   BMET Recent Labs    07/08/22 0400 07/09/22 0406  NA 135 136  K 3.6 3.2*  CL 96* 100  CO2 27 26  GLUCOSE 149* 130*  BUN 39* 51*  CREATININE 1.34* 1.52*  CALCIUM 7.0* 7.1*   PT/INR Recent Labs    07/07/22 0323 07/08/22 0400  LABPROT 20.8* 15.7*  INR 1.8* 1.3*   ABG Recent Labs    07/07/22 1743 07/07/22 2125  PHART 7.389 7.398  HCO3 30.5* 30.7*    Studies/Results: VAS [place] LOWER EXTREMITY VENOUS (DVT)  Result Date: 07/07/2022  Lower Venous DVT Study Patient Name:  [name]  Date of Exam:   07/07/2022 Medical Rec #: [id number]        Accession #:    [id number] Date of Birth: 06/11/1941        Patient Gender: F Patient Age:   81 years Exam Location:  [facility] Procedure:      VAS [place] LOWER EXTREMITY VENOUS (DVT) Referring Phys: MURALI RAMASWAMY --------------------------------------------------------------------------------   Indications: Edema.  Risk Factors: None identified. Limitations: Poor ultrasound/tissue interface and patient positioning, patient immobility. Comparison Study: No prior studies. Performing Technologist: Oliver Hum RVT  Examination Guidelines: A complete evaluation includes B-mode imaging, spectral Doppler, color Doppler, and power Doppler as needed of all accessible portions of each vessel. Bilateral testing is considered an integral part of a complete examination. Limited examinations for reoccurring indications may be performed as noted. The reflux portion of the exam is performed with the patient in reverse Trendelenburg.  +---------+---------------+---------+-----------+----------+--------------+ RIGHT    CompressibilityPhasicitySpontaneityPropertiesThrombus Aging +---------+---------------+---------+-----------+----------+--------------+ CFV      Full           Yes      Yes                                 +---------+---------------+---------+-----------+----------+--------------+ SFJ      Full                                                        +---------+---------------+---------+-----------+----------+--------------+ FV Prox  Full                                                        +---------+---------------+---------+-----------+----------+--------------+  FV Mid                  Yes      Yes                                 +---------+---------------+---------+-----------+----------+--------------+ FV Distal               Yes      Yes                                 +---------+---------------+---------+-----------+----------+--------------+ PFV      Full                                                        +---------+---------------+---------+-----------+----------+--------------+ POP      Full           Yes      Yes                                 +---------+---------------+---------+-----------+----------+--------------+ PTV      Full                                                         +---------+---------------+---------+-----------+----------+--------------+ PERO     Full                                                        +---------+---------------+---------+-----------+----------+--------------+   +---------+---------------+---------+-----------+----------+--------------+ LEFT     CompressibilityPhasicitySpontaneityPropertiesThrombus Aging +---------+---------------+---------+-----------+----------+--------------+ CFV      Full           Yes      Yes                                 +---------+---------------+---------+-----------+----------+--------------+ SFJ      Full                                                        +---------+---------------+---------+-----------+----------+--------------+ FV Prox  Full                                                        +---------+---------------+---------+-----------+----------+--------------+ FV Mid                  Yes      Yes                                 +---------+---------------+---------+-----------+----------+--------------+  FV DistalFull                                                        +---------+---------------+---------+-----------+----------+--------------+ PFV      Full                                                        +---------+---------------+---------+-----------+----------+--------------+ POP      Full           Yes      Yes                                 +---------+---------------+---------+-----------+----------+--------------+ PTV      Full                                                        +---------+---------------+---------+-----------+----------+--------------+ PERO     Full                                                        +---------+---------------+---------+-----------+----------+--------------+     Summary: RIGHT: - There is no evidence of deep vein  thrombosis in the lower extremity. However, portions of this examination were limited- see technologist comments above.  - No cystic structure found in the popliteal fossa.  LEFT: - There is no evidence of deep vein thrombosis in the lower extremity. However, portions of this examination were limited- see technologist comments above.  - No cystic structure found in the popliteal fossa.  *See table(s) above for measurements and observations. Electronically signed by Harold Barban MD on 07/07/2022 at 12:45:35 PM.    Final     Anti-infectives: Anti-infectives (From admission, onward)    Start     Dose/Rate Route Frequency Ordered Stop   07/06/22 1230  cefTRIAXone (ROCEPHIN) 2 g in sodium chloride 0.9 % 100 mL IVPB        2 g 200 mL/hr over 30 Minutes Intravenous Every 24 hours 07/06/22 1131     07/05/22 1830  piperacillin-tazobactam (ZOSYN) IVPB 3.375 g        3.375 g 100 mL/hr over 30 Minutes Intravenous  Once 07/05/22 1827 07/05/22 1905   07/05/22 1345  cefTRIAXone (ROCEPHIN) 2 g in sodium chloride 0.9 % 100 mL IVPB        2 g 200 mL/hr over 30 Minutes Intravenous  Once 07/05/22 1341 07/05/22 1435   07/05/22 1345  metroNIDAZOLE (FLAGYL) IVPB 500 mg        500 mg 100 mL/hr over 60 Minutes Intravenous Every 12 hours 07/05/22 1341         Assessment/Plan: POD 4, s/p elap (negative)- CW 11/24 -elap for lactic acidosis, pressors concern for ischemia was negative -may advance TFs per dietitian protocol. -likely source of sepsis is  infectious and lab derangements all associated -will arrange for outpatient follow up for staple removal unless still present in the hospital when staples need to be removed and follow up in our office in 3-4 weeks.  FEN - NPO, TFs, IVFs per primary VTE - heparin ID - Rocephin/Flagyl   Henreitta Cea 07/09/2022

## 2022-07-09 NOTE — Procedures (Signed)
Extubation Procedure Note  Patient Details:   Name: [name] DOB: 1941-06-23 MRN: [id number]   Airway Documentation:    Vent end date: 07/09/22 Vent end time: 1147   Evaluation  O2 sats: stable throughout and currently acceptable Complications: No apparent complications Patient did tolerate procedure well. Bilateral Breath Sounds: Rhonchi, Diminished   Yes  Pt extubated to 4L [place] per MD Order. Pt had positive cuff leak prior to extubation. Pt able to speak post extubation, no stridor heard, Pt unable/unwilling to cough post extubation. Pt and family encouraged to use [place] to clear secretions. VS WNL. RT will continue to monitor  Jesse Sans 07/09/2022, 11:48 AM

## 2022-07-10 DIAGNOSIS — E43 Unspecified severe protein-calorie malnutrition: Secondary | ICD-10-CM | POA: Insufficient documentation

## 2022-07-10 DIAGNOSIS — E440 Moderate protein-calorie malnutrition: Secondary | ICD-10-CM | POA: Insufficient documentation

## 2022-07-10 DIAGNOSIS — R6521 Severe sepsis with septic shock: Secondary | ICD-10-CM | POA: Diagnosis not present

## 2022-07-10 DIAGNOSIS — A419 Sepsis, unspecified organism: Secondary | ICD-10-CM | POA: Diagnosis not present

## 2022-07-10 LAB — CBC
HCT: 27.8 % — ABNORMAL LOW (ref 36.0–46.0)
Hemoglobin: 8.8 g/dL — ABNORMAL LOW (ref 12.0–15.0)
MCH: 30.4 pg (ref 26.0–34.0)
MCHC: 31.7 g/dL (ref 30.0–36.0)
MCV: 96.2 fL (ref 80.0–100.0)
Platelets: 140 10*3/uL — ABNORMAL LOW (ref 150–400)
RBC: 2.89 MIL/uL — ABNORMAL LOW (ref 3.87–5.11)
RDW: 14.9 % (ref 11.5–15.5)
WBC: 7.9 10*3/uL (ref 4.0–10.5)
nRBC: 0 % (ref 0.0–0.2)

## 2022-07-10 LAB — COMPREHENSIVE METABOLIC PANEL
ALT: 20 U/L (ref 0–44)
AST: 64 U/L — ABNORMAL HIGH (ref 15–41)
Albumin: 1.6 g/dL — ABNORMAL LOW (ref 3.5–5.0)
Alkaline Phosphatase: 158 U/L — ABNORMAL HIGH (ref 38–126)
Anion gap: 7 (ref 5–15)
BUN: 25 mg/dL — ABNORMAL HIGH (ref 8–23)
CO2: 29 mmol/L (ref 22–32)
Calcium: 7.4 mg/dL — ABNORMAL LOW (ref 8.9–10.3)
Chloride: 104 mmol/L (ref 98–111)
Creatinine, Ser: 0.82 mg/dL (ref 0.44–1.00)
GFR, Estimated: >60 mL/min (ref >60)
Glucose, Bld: 108 mg/dL — ABNORMAL HIGH (ref 70–99)
Potassium: 2.9 mmol/L — ABNORMAL LOW (ref 3.5–5.1)
Sodium: 140 mmol/L (ref 135–145)
Total Bilirubin: 0.4 mg/dL (ref 0.3–1.2)
Total Protein: 4.1 g/dL — ABNORMAL LOW (ref 6.5–8.1)

## 2022-07-10 LAB — CULTURE, BLOOD (ROUTINE X 2)
Culture: NO GROWTH
Culture: NO GROWTH
Special Requests: ADEQUATE

## 2022-07-10 LAB — MAGNESIUM: Magnesium: 1.9 mg/dL (ref 1.7–2.4)

## 2022-07-10 LAB — BASIC METABOLIC PANEL
Anion gap: 8 (ref 5–15)
BUN: 17 mg/dL (ref 8–23)
CO2: 29 mmol/L (ref 22–32)
Calcium: 7.4 mg/dL — ABNORMAL LOW (ref 8.9–10.3)
Chloride: 105 mmol/L (ref 98–111)
Creatinine, Ser: 0.7 mg/dL (ref 0.44–1.00)
GFR, Estimated: >60 mL/min (ref >60)
Glucose, Bld: 174 mg/dL — ABNORMAL HIGH (ref 70–99)
Potassium: 3.5 mmol/L (ref 3.5–5.1)
Sodium: 142 mmol/L (ref 135–145)

## 2022-07-10 LAB — GLUCOSE, CAPILLARY
Glucose-Capillary: 102 mg/dL — ABNORMAL HIGH (ref 70–99)
Glucose-Capillary: 79 mg/dL (ref 70–99)
Glucose-Capillary: 141 mg/dL — ABNORMAL HIGH (ref 70–99)
Glucose-Capillary: 164 mg/dL — ABNORMAL HIGH (ref 70–99)
Glucose-Capillary: 108 mg/dL — ABNORMAL HIGH (ref 70–99)

## 2022-07-10 MED ORDER — PROSOURCE PLUS PO LIQD
30.0000 mL | Freq: Two times a day (BID) | ORAL | Status: DC
  Filled 2022-07-10 (×2): qty 30

## 2022-07-10 MED ORDER — POTASSIUM CHLORIDE 20 MEQ PO PACK
40.0000 meq | PACK | Freq: Once | ORAL | Status: AC
  Administered 2022-07-10: 40 meq
  Filled 2022-07-10: qty 2

## 2022-07-10 MED ORDER — FUROSEMIDE 10 MG/ML IJ SOLN
20.0000 mg | Freq: Once | INTRAMUSCULAR | Status: AC
  Administered 2022-07-10: 20 mg via INTRAVENOUS
  Filled 2022-07-10: qty 2

## 2022-07-10 MED ORDER — POTASSIUM CHLORIDE 10 MEQ/50ML IV SOLN
10.0000 meq | INTRAVENOUS | Status: AC
  Administered 2022-07-10 (×4): 10 meq via INTRAVENOUS
  Filled 2022-07-10 (×4): qty 50

## 2022-07-10 MED ORDER — SODIUM CHLORIDE 0.9% FLUSH
10.0000 mL | Freq: Two times a day (BID) | INTRAVENOUS | Status: DC
  Administered 2022-07-10 – 2022-07-15 (×11): 10 mL

## 2022-07-10 MED ORDER — ENSURE ENLIVE PO LIQD
237.0000 mL | Freq: Three times a day (TID) | ORAL | Status: DC
  Administered 2022-07-10 – 2022-07-16 (×16): 237 mL via ORAL

## 2022-07-10 MED ORDER — PANTOPRAZOLE SODIUM 40 MG PO TBEC
40.0000 mg | DELAYED_RELEASE_TABLET | Freq: Every day | ORAL | Status: DC
  Administered 2022-07-10 – 2022-07-15 (×6): 40 mg via ORAL
  Filled 2022-07-10 (×6): qty 1

## 2022-07-10 MED ORDER — SODIUM CHLORIDE 0.9% FLUSH
10.0000 mL | INTRAVENOUS | Status: DC | PRN
  Administered 2022-07-11: 10 mL

## 2022-07-10 MED ORDER — BOOST / RESOURCE BREEZE PO LIQD CUSTOM
1.0000 | Freq: Three times a day (TID) | ORAL | Status: DC
  Administered 2022-07-10: 1 via ORAL

## 2022-07-10 NOTE — Evaluation (Signed)
Occupational Therapy Evaluation Patient Details Name: [name] MRN: [id number] DOB: 01-04-1941 Today's Date: 07/10/2022   History of Present Illness 68 F admitted 11/24 with Abd pain and syncope after straining for BM. Hypotensive with lactic acidosis, found to have colitis, sepsis. 11/24 Exploratory laparotomy with normal findings. Intubated 11/24-11/28. PMHx: constipation, fibromyalgia, anxiety, insomnia, GERD, AV block, HTN   Clinical Impression   PTA patient independent with ADLs, mobility using cane at times; daughter reports staying with her 24/7 recently due to cardiac issues.  Patient admitted for above and presents with problem list below, including impaired balance, generalized weakness, pain, edema, and decreased activity tolerance.  She currently requires min assist +2 for transfers and limited mobility, min to total assist for ADLs.  She presents with decreased recall, attention, problem solving and awareness; although is reporting hallucinations and delirium during admission.  VSS on 3L during session. Based on performance today, believe pt will best benefit from continued OT service acutely and after dc at AIR level to optimize independence, safety and return to PLOF. Will follow.      Recommendations for follow up therapy are one component of a multi-disciplinary discharge planning process, led by the attending physician.  Recommendations may be updated based on patient status, additional functional criteria and insurance authorization.   Follow Up Recommendations  Acute inpatient rehab (3hours/day)     Assistance Recommended at Discharge Frequent or constant Supervision/Assistance  Patient can return home with the following A lot of help with walking and/or transfers;A lot of help with bathing/dressing/bathroom;Assistance with cooking/housework;Direct supervision/assist for medications management;Direct supervision/assist for financial management;Assist for  transportation;Help with stairs or ramp for entrance    Functional Status Assessment  Patient has had a recent decline in their functional status and demonstrates the ability to make significant improvements in function in a reasonable and predictable amount of time.  Equipment Recommendations  Other (comment) (defer)    Recommendations for Other Services Rehab consult     Precautions / Restrictions Precautions Precautions: Fall Precaution Comments: flexiseal Restrictions Weight Bearing Restrictions: No      Mobility Bed Mobility Overal bed mobility: Needs Assistance Bed Mobility: Supine to Sit     Supine to sit: Min assist, +2 for physical assistance, HOB elevated          Transfers Overall transfer level: Needs assistance Equipment used: 2 person hand held assist Transfers: Sit to/from Stand, Bed to chair/wheelchair/BSC Sit to Stand: Min assist, +2 physical assistance     Step pivot transfers: Mod assist     General transfer comment: posterior lean      Balance Overall balance assessment: Needs assistance Sitting-balance support: No upper extremity supported, Feet supported Sitting balance-Leahy Scale: Fair     Standing balance support: Bilateral upper extremity supported, Reliant on assistive device for balance Standing balance-Leahy Scale: Poor Standing balance comment: minA x2, posterior lean                           ADL either performed or assessed with clinical judgement   ADL Overall ADL's : Needs assistance/impaired     Grooming: Minimal assistance;Sitting           Upper Body Dressing : Minimal assistance;Sitting   Lower Body Dressing: +2 for physical assistance;+2 for safety/equipment;Sit to/from stand;Total assistance   Toilet Transfer: Moderate assistance;Stand-pivot Toilet Transfer Details (indicate cue type and reason): simulated to recliner         Functional mobility during ADLs:  Minimal assistance;+2 for  physical assistance;+2 for safety/equipment;Moderate assistance;Cueing for sequencing;Cueing for safety       Vision Baseline Vision/History: 1 Wears glasses Ability to See in Adequate Light: 0 Adequate Patient Visual Report: No change from baseline Vision Assessment?: No apparent visual deficits     Perception     Praxis      Pertinent Vitals/Pain Pain Assessment Pain Assessment: Faces Faces Pain Scale: Hurts little more Pain Location: abdomen Pain Descriptors / Indicators: Grimacing Pain Intervention(s): Monitored during session, Repositioned, Limited activity within patient's tolerance     Hand Dominance Right   Extremity/Trunk Assessment Upper Extremity Assessment Upper Extremity Assessment: Generalized weakness (WFL ROM, edema and weakness noted- grossly 3-/5 MMT)   Lower Extremity Assessment Lower Extremity Assessment: Defer to PT evaluation   Cervical / Trunk Assessment Cervical / Trunk Assessment: Kyphotic   Communication Communication Communication: No difficulties   Cognition Arousal/Alertness: Awake/alert Behavior During Therapy: WFL for tasks assessed/performed Overall Cognitive Status: Impaired/Different from baseline Area of Impairment: Orientation, Memory, Safety/judgement, Awareness, Problem solving, Following commands, Attention                 Orientation Level: Disoriented to, Situation (pt reports she had a rare surgery) Current Attention Level: Sustained Memory: Decreased short-term memory Following Commands: Follows one step commands consistently, Follows one step commands with increased time, Follows multi-step commands inconsistently Safety/Judgement: Decreased awareness of safety, Decreased awareness of deficits Awareness: Emergent Problem Solving: Slow processing, Requires verbal cues General Comments: pt easily distracted, follows simple commands with increased time.  She is pleasantly confused, voicing "do not pull out my tube"  (speaking of ETT) but pt only has O2 via [place] at this point     General Comments  VSS on 3L    Exercises     Shoulder Instructions      Home Living Family/patient expects to be discharged to:: Private residence (townhome) Living Arrangements: Alone (dtr has been living with the pt for the last month) Available Help at Discharge: Family;Available 24 hours/day Type of Home: House Home Access: Level entry     Home Layout: Two level;Able to live on main level with bedroom/bathroom Alternate Level Stairs-Number of Steps: flight   Bathroom Shower/Tub: Hospital doctor Toilet: Handicapped height     Home Equipment: Conservation officer, nature (2 wheels);Cane - single point;Grab bars - tub/shower          Prior Functioning/Environment Prior Level of Function : Independent/Modified Independent;Driving             Mobility Comments: independent with PRN use of SPC ADLs Comments: has needed assistance if bathing in bathtub, typically indepedent with ADLs, IADLs and driving        OT Problem List: Decreased strength;Decreased activity tolerance;Impaired balance (sitting and/or standing);Decreased cognition;Decreased safety awareness;Decreased knowledge of use of DME or AE;Decreased knowledge of precautions;Obesity;Impaired UE functional use;Increased edema;Pain      OT Treatment/Interventions: Self-care/ADL training;Therapeutic exercise;DME and/or AE instruction;Energy conservation;Therapeutic activities;Patient/family education;Balance training;Cognitive remediation/compensation    OT Goals(Current goals can be found in the care plan section) Acute Rehab OT Goals Patient Stated Goal: get better OT Goal Formulation: With patient Time For Goal Achievement: 07/24/22 Potential to Achieve Goals: Good  OT Frequency: Min 2X/week    Co-evaluation PT/OT/SLP Co-Evaluation/Treatment: Yes Reason for Co-Treatment: Complexity of the patient's impairments (multi-system  involvement);Necessary to address cognition/behavior during functional activity;For patient/therapist safety;To address functional/ADL transfers PT goals addressed during session: Mobility/safety with mobility;Balance;Strengthening/ROM OT goals addressed during session: ADL's and self-care  AM-PAC OT "6 Clicks" Daily Activity     Outcome Measure Help from another person eating meals?: A Little Help from another person taking care of personal grooming?: A Little Help from another person toileting, which includes using toliet, bedpan, or urinal?: A Lot Help from another person bathing (including washing, rinsing, drying)?: A Lot Help from another person to put on and taking off regular upper body clothing?: A Little Help from another person to put on and taking off regular lower body clothing?: Total 6 Click Score: 14   End of Session Equipment Utilized During Treatment: Oxygen Nurse Communication: Mobility status  Activity Tolerance: Patient tolerated treatment well Patient left: in chair;with call bell/phone within reach;with chair alarm set;with family/visitor present  OT Visit Diagnosis: Other abnormalities of gait and mobility (R26.89);Muscle weakness (generalized) (M62.81);Pain;Other symptoms and signs involving cognitive function                Time: 5379-4327 OT Time Calculation (min): 32 min Charges:  OT General Charges $OT Visit: 1 Visit OT Evaluation $OT Eval Moderate Complexity: 1 Mod  Jolaine Artist, OT Acute Rehabilitation Services Office [phone]   Delight Stare 07/10/2022, 2:02 PM

## 2022-07-10 NOTE — Progress Notes (Signed)
Patient arrived to [place] room 23 alert and oriented, pain level 3/10. Rectal tube in place, purewick setup, oxygen [place] attached. Bed in lowest position, call light in reach will continue to monitor pt.

## 2022-07-10 NOTE — Progress Notes (Signed)
D/W primary service.  Feeding access fell out.  On CLD, ok to adv to regular diet given bowel function and no surgical intervention on her intestines.  Follow up arranged with [place] in 3 weeks and order for staples to be removed on 12/6 if she remains in the hospital at that time.  We are available as needed.  [name] 9:14 AM 07/10/2022

## 2022-07-10 NOTE — Progress Notes (Signed)
[facility] ADULT ICU REPLACEMENT PROTOCOL   The patient does apply for the [facility] Adult ICU Electrolyte Replacment Protocol based on the criteria listed below:   1.Exclusion criteria: TCTS, ECMO, Dialysis, and Myasthenia Gravis patients 2. Is GFR >/= 30 ml/min? Yes.    Patient's GFR today is >60  3. Is SCr </= 2? Yes.   Patient's SCr is 0.82 mg/dL 4. Did SCr increase >/= 0.5 in 24 hours? No. 5.Pt's weight >40kg  Yes.   6. Abnormal electrolyte(s): k 2.9  7. Electrolytes replaced per protocol 8.  Call MD STAT for K+ </= 2.5, Phos </= 1, or Mag </= 1 Physician:    Ronda Fairly A 07/10/2022 5:01 AM

## 2022-07-10 NOTE — Progress Notes (Addendum)
Nutrition Follow-up  DOCUMENTATION CODES:  Non-severe (moderate) malnutrition in context of acute illness/injury  INTERVENTION:  Continue current diet as ordered Needs ordering assistance Ensure Enlive po TID, each supplement provides 350 kcal and 20 grams of protein. MVI with minerals daily  NUTRITION DIAGNOSIS:  Moderate Malnutrition (in the context of acute illness/injury) related to  (inadequate energy intake with increased nutrition needs) as evidenced by mild muscle depletion, mild fat depletion, energy intake < or equal to 50% for > or equal to 5 days. - New dx established 11/29  GOAL:  Patient will meet greater than or equal to 90% of their needs - progressing  MONITOR:  PO intake, Supplement acceptance, Labs, I & O's, Skin  REASON FOR ASSESSMENT:  Consult Enteral/tube feeding initiation and management  ASSESSMENT:  Pt with hx of HTN, chronic constipation, PUD, PVD, GERD, and hx renal cancer presented to ED after a near syncopal episode and worsening abdominal pain  11/24 - Op, Ex Lap (no significant findings aside from large stool burden), returned to ICU on Vent   11/28 - extubated  Pt able to be extubated yesterday. NGT pulled and pt was placed on a clear liquid diet. Now advanced to regular.    Pt shows signs of mild muscle depletions and has now been without adequate nutrition x 5 days. Meets criteria for acute moderate-malnutrition. Pt also has surgical wounds that increase her nutrition needs for healing.  Pt resting in bedside chair at the time of assessment. Extremely lethargic. Would wake up to physical touch but fell asleep mid-sentence multiple times. Unable to tell me what she ate for breakfast. 25% intake of breakfast noted in flowsheet. RN reports pt refused prosource but boost breeze ~50% consumed at bedside. Suspect that it is highly unlikely pt will be able to meet her needs orally at this time. If poor PO continues >24 hours would recommend placement of  cortrak tube 12/1 and resume enteral feeds.    Intake/Output Summary (Last 24 hours) at 07/10/2022 1150 Last data filed at 07/10/2022 1100 Gross per 24 hour  Intake 1223.26 ml  Output 4315 ml  Net -3091.74 ml  Net IO Since Admission: 15,237.05 mL [07/10/22 1150]  Average Meal Intake: 11/29: 25% intake x 1 recorded meals  Nutritionally Relevant Medications: Scheduled Meds:  Ensure Enlive  237 mL Oral TID BM   insulin aspart  0-15 Units Subcutaneous Q4H   pantoprazole IV  40 mg Intravenous QHS   Labs Reviewed: K 2.9 BUN 25 CBG ranges from 79-126 mg/dL over the last 24 hours HgbA1c 5.7% (9/19)  NUTRITION - FOCUSED PHYSICAL EXAM: Flowsheet Row Most Recent Value  Orbital Region Mild depletion  Upper Arm Region No depletion  Thoracic and Lumbar Region No depletion  Buccal Region Mild depletion  Temple Region Mild depletion  Clavicle Bone Region Mild depletion  Clavicle and Acromion Bone Region Mild depletion  Scapular Bone Region No depletion  Dorsal Hand No depletion  Patellar Region No depletion  Anterior Thigh Region No depletion  Posterior Calf Region No depletion  Edema (RD Assessment) Moderate  [hands and legs]  Hair Reviewed  Eyes Reviewed  Mouth Reviewed  Skin Reviewed  Nails Reviewed   Diet Order:   Diet Order             Diet regular Room service appropriate? Yes; Fluid consistency: Thin  Diet effective now                   EDUCATION NEEDS:  Not appropriate for education at this time  Skin:  Skin Assessment: Reviewed RN Assessment (midline incision)  Last BM:  11/26 - type 5  Height:  Ht Readings from Last 1 Encounters:  06/27/22 '5\' 2"'$  (1.575 m)    Weight:  Wt Readings from Last 1 Encounters:  07/10/22 80.1 kg    Ideal Body Weight:  50 kg  BMI:  Body mass index is 32.3 kg/m.  Estimated Nutritional Needs:  Kcal:  1700-1900 kcal/d Protein:  100-115 g/d Fluid:  1.8-2L/d   Ranell Patrick, RD, LDN Clinical Dietitian RD pager #  available in [place]  After hours/weekend pager # available in [facility]

## 2022-07-10 NOTE — Progress Notes (Signed)
? ?  Inpatient Rehab Admissions Coordinator : ? ?Per therapy recommendations, patient was screened for CIR candidacy by Yaeli Hartung RN MSN.  At this time patient appears to be a potential candidate for CIR. I will place a rehab consult per protocol for full assessment. Please call me with any questions. ? ?Kaoru Rezendes RN MSN ?Admissions Coordinator ?[phone] ?  ?

## 2022-07-10 NOTE — Progress Notes (Signed)
NAME:  [name], MRN:  [id number], DOB:  21-Sep-1940, LOS: 5 ADMISSION DATE:  07/05/2022  History of Present Illness:  17 F with chronic constipation presents with abdominal pain after syncopal episode during straining to defecate. Hypotensive with lactic acidosis, found to have colitis. Admitted to ICU for septic shock on pressors. Exploratory laparotomy with normal findings.  Significant Hospital Events:  11/24 > admit. [facility] -plap to OPr -Dr Dema Severin- > normal findings. Retrun on Vent and contijued shock             - CVL in ER             - A Line             - urine culture             - blood culture             - Mrsa pcr - neg   11/25 - -normal findings in the operating room yesterday.  Currently still remains on the ventilator 40% FiO2 but in refractory shock with Levophed 4 mcg and also vasopressin and stress dose.  Making urine.  Remains on bicarbonate drip with rising lactic acidosis.  On Flagyl antibiotic.  Daughter [name] at the bedside and is concerned that patient might not survive this illness.   11/26 - vent 40% fio2, fent gtt, vaso gtt, levophed gtt, bic gtt +. Made urine. AKI better Fever improved. PCT > 30 but lactate still high > 2. CK normal. Lactulose overnight -> 1 stool. This AM - ccs ok with laxatives and tube feeds   11/24 > current ceftriaxone 11/24 > current metronidazole 11/24 zosyn  Interim History / Subjective:  Feeling better today. Pulled NGT and a-line out yesterday afternoon. Reports continued diarrhea. Not much appetite.  Objective   Blood pressure (!) 111/54, pulse 86, temperature 99.2 F (37.3 C), temperature source Axillary, resp. rate 13, weight 80.1 kg, SpO2 100 %.    Vent Mode: PSV;CPAP FiO2 (%):  [40 %] 40 % PEEP:  [5 cmH20] 5 cmH20 Pressure Support:  [10 cmH20] 10 cmH20   Intake/Output Summary (Last 24 hours) at 07/10/2022 0737 Last data filed at 07/10/2022 0700 Gross per 24 hour  Intake 1758.67 ml  Output 4380 ml  Net  -2621.33 ml  Urine -2650 Stool -1880 Since admission +15,191 Filed Weights   07/05/22 1000 07/06/22 0500 07/10/22 0401  Weight: 68.8 kg 78.2 kg 80.1 kg    Examination: Alert.  Well-appearing. Regular rate and rhythm.  Right radial pulse 2+.  Diffuse 1+ pitting edema. Respirations are regular and unlabored.  Bilateral anterior lung fields clear. Abdomen is distended and tender diffusely.  Tympanic to percussion in all quadrants.  Exploratory laparotomy incision is clean and draining clear fluid. Skin is warm and dry. Left IJ CVC in place. Alert and oriented.  No gross focal neurologic deficits. She is pleasant and appropriate.  Mood and affect are concordant.  Labs: Sodium 140 Potassium 2.9 CO2 29 BUN 25 Creatinine 0.82, decreasing  Lactate 1.3  WBC 7.9 Hemoglobin 8.8 Platelets 140  07/05/2022 blood cultures no growth x 5 days final 07/06/2022 urine cultures no growth final  Imaging: No new imaging  Resolved Hospital Problem list   Acute hypoxemic respiratory failure, resolved Septic shock  Assessment & Plan:  Principal Problem:   Septic shock ([place]) Active Problems:   Hypovolemia  Severe constipation Bowel obstruction Stercoral versus infectious colitis No acute findings on exploratory laparotomy 11/24. Continued diarrhea.  Abdominal exam stable. Regular diet. - Transfer to floor - Advance diet, appreciate dietitian assistance - ceftriaxone + flagyl until 12/1 - Outpatient surgery follow up  AKI, improved Volume overload Good UOP with low-dose lasix yesterday. Cr improved. Continue diuresis. - Lasix 20 mg IV once - F/u afternoon BMP and Mg   Paroxysmal A-fib with RVR Secondary to critical illness and major surgery. Sinus rhythm and stable today. No anticoagulation given paroxysmal nature in setting of acute critical illness.   Hypokalemia Potassium 2.9. Replace aggressively with diuresis. - Replaced   Normocytic anemia Hgb decrease with concomitant  decrease in platelets. Likely secondary to ABLA and critical illness preventing replacement. - Transfuse for hgb < 7   Hyperglycemia Glucose to 313 during admission after intraoperative dexamethasone. No history of diabetes. 9/23 A1c 5.7. - SSI  Best practice (daily eval):  Diet/type: Regular consistency (see orders) DVT prophylaxis: prophylactic heparin  GI prophylaxis: PPI Lines: N/A Foley:  N/A Code Status:  DNR  Nani Gasser MD 07/10/2022, 7:37 AM  Pager: [phone]

## 2022-07-10 NOTE — Evaluation (Signed)
Physical Therapy Evaluation Patient Details Name: [name] MRN: [id number] DOB: 1941/04/11 Today's Date: 07/10/2022  History of Present Illness  15 F admitted 11/24 with Abd pain and syncope after straining for BM. Hypotensive with lactic acidosis, found to have colitis, sepsis. 11/24 Exploratory laparotomy with normal findings. Intubated 11/24-11/28. PMHx: constipation, fibromyalgia, anxiety, insomnia, GERD, AV block, HTN  Clinical Impression  Pt presents to PT with deficits in functional mobility, strength, power, balance, endurance, cognition. Pt is generally weak, requiring physical assistance to prevent falls due to posterior lean when standing and transferring. Pt with impaired memory and cognition, reporting hallucinations and delirium this admission. Pt will benefit from aggressive mobilization and PT POC in an effort to improve mobility quality and restore ambulation abilities. PT recommends AIR admission at this time.       Recommendations for follow up therapy are one component of a multi-disciplinary discharge planning process, led by the attending physician.  Recommendations may be updated based on patient status, additional functional criteria and insurance authorization.  Follow Up Recommendations Acute inpatient rehab (3hours/day)      Assistance Recommended at Discharge Intermittent Supervision/Assistance  Patient can return home with the following  A lot of help with walking and/or transfers;A lot of help with bathing/dressing/bathroom;Assistance with cooking/housework;Direct supervision/assist for medications management;Direct supervision/assist for financial management;Assist for transportation;Help with stairs or ramp for entrance    Equipment Recommendations  (TBD pending progress)  Recommendations for Other Services  Rehab consult    Functional Status Assessment Patient has had a recent decline in their functional status and demonstrates the ability to make  significant improvements in function in a reasonable and predictable amount of time.     Precautions / Restrictions Precautions Precautions: Fall Restrictions Weight Bearing Restrictions: No      Mobility  Bed Mobility Overal bed mobility: Needs Assistance Bed Mobility: Supine to Sit     Supine to sit: Min assist, +2 for physical assistance, HOB elevated          Transfers Overall transfer level: Needs assistance Equipment used: 2 person hand held assist Transfers: Sit to/from Stand, Bed to chair/wheelchair/BSC Sit to Stand: Min assist, +2 physical assistance   Step pivot transfers: Mod assist       General transfer comment: posterior lean    Ambulation/Gait             Pre-gait activities: 2 steps forward and backward from edge of bed, posterior lean    Stairs            Wheelchair Mobility    Modified Rankin (Stroke Patients Only)       Balance Overall balance assessment: Needs assistance Sitting-balance support: No upper extremity supported, Feet supported Sitting balance-Leahy Scale: Fair     Standing balance support: Bilateral upper extremity supported, Reliant on assistive device for balance Standing balance-Leahy Scale: Poor Standing balance comment: minA x2, posterior lean                             Pertinent Vitals/Pain Pain Assessment Pain Assessment: Faces Faces Pain Scale: Hurts little more Pain Location: abdomen Pain Descriptors / Indicators: Grimacing Pain Intervention(s): Monitored during session    Home Living Family/patient expects to be discharged to:: Private residence (townhome) Living Arrangements: Alone (dtr has been living with the pt for the last month) Available Help at Discharge: Family;Available 24 hours/day Type of Home: House Home Access: Level entry     Alternate Level  Stairs-Number of Steps: flight Home Layout: Two level;Able to live on main level with bedroom/bathroom Home Equipment:  Rolling Walker (2 wheels);Cane - single point;Grab bars - tub/shower      Prior Function Prior Level of Function : Independent/Modified Independent;Driving             Mobility Comments: independent with PRN use of SPC ADLs Comments: has needed assistance if bathing in bathtub     Hand Dominance   Dominant Hand: Right    Extremity/Trunk Assessment   Upper Extremity Assessment Upper Extremity Assessment: Defer to OT evaluation    Lower Extremity Assessment Lower Extremity Assessment: Generalized weakness    Cervical / Trunk Assessment Cervical / Trunk Assessment: Kyphotic  Communication   Communication: No difficulties  Cognition Arousal/Alertness: Awake/alert Behavior During Therapy: WFL for tasks assessed/performed Overall Cognitive Status: Impaired/Different from baseline Area of Impairment: Orientation, Memory, Safety/judgement, Awareness, Problem solving                 Orientation Level: Disoriented to, Situation (pt reports she had a rare surgery)   Memory: Decreased short-term memory   Safety/Judgement: Decreased awareness of safety, Decreased awareness of deficits Awareness: Emergent Problem Solving: Slow processing          General Comments General comments (skin integrity, edema, etc.): VSS on 3L [place]    Exercises     Assessment/Plan    PT Assessment Patient needs continued PT services  PT Problem List Decreased strength;Decreased activity tolerance;Decreased balance;Decreased mobility;Decreased cognition;Decreased knowledge of use of DME;Decreased safety awareness;Decreased knowledge of precautions;Cardiopulmonary status limiting activity       PT Treatment Interventions DME instruction;Gait training;Functional mobility training;Therapeutic activities;Therapeutic exercise;Balance training;Neuromuscular re-education;Cognitive remediation;Patient/family education    PT Goals (Current goals can be found in the Care Plan section)  Acute  Rehab PT Goals Patient Stated Goal: to return to prior level of mobility PT Goal Formulation: With patient/family Time For Goal Achievement: 07/24/22 Potential to Achieve Goals: Fair    Frequency Min 3X/week     Co-evaluation PT/OT/SLP Co-Evaluation/Treatment: Yes Reason for Co-Treatment: Complexity of the patient's impairments (multi-system involvement);Necessary to address cognition/behavior during functional activity;For patient/therapist safety;To address functional/ADL transfers PT goals addressed during session: Mobility/safety with mobility;Balance;Strengthening/ROM         AM-PAC PT "6 Clicks" Mobility  Outcome Measure Help needed turning from your back to your side while in a flat bed without using bedrails?: A Lot Help needed moving from lying on your back to sitting on the side of a flat bed without using bedrails?: A Lot Help needed moving to and from a bed to a chair (including a wheelchair)?: A Lot Help needed standing up from a chair using your arms (e.g., wheelchair or bedside chair)?: A Lot Help needed to walk in hospital room?: Total Help needed climbing 3-5 steps with a railing? : Total 6 Click Score: 10    End of Session Equipment Utilized During Treatment: Oxygen Activity Tolerance: Patient tolerated treatment well Patient left: in chair;with call bell/phone within reach;with chair alarm set;with family/visitor present Nurse Communication: Mobility status PT Visit Diagnosis: Other abnormalities of gait and mobility (R26.89);Muscle weakness (generalized) (M62.81)    Time: 4665-9935 PT Time Calculation (min) (ACUTE ONLY): 36 min   Charges:   PT Evaluation $PT Eval Moderate Complexity: 1 Mod          Zenaida Niece, PT, DPT Acute Rehabilitation Office [phone]   Zenaida Niece 07/10/2022, 12:23 PM

## 2022-07-10 NOTE — Progress Notes (Signed)

## 2022-07-10 NOTE — Progress Notes (Incomplete)
Attending note: I have seen and examined the patient. History, labs and imaging reviewed.  81 Y/O with lactic acidosis, shock. S.p ex lap out of for concern of bowel perf but did not find any abnormalities. Extubated yesterday. Sitting up in chair at bedside today AM  Blood pressure (!) 100/47, pulse 87, temperature 99.2 F (37.3 C), temperature source Axillary, resp. rate 15, weight 80.1 kg, SpO2 98 %. Gen:      No acute distress HEENT:  EOMI, sclera anicteric Neck:     No masses; no thyromegaly Lungs:    Clear to auscultation bilaterally; normal respiratory effort*** CV:         Regular rate and rhythm; no murmurs Abd:      + bowel sounds; soft, non-tender; no palpable masses, no distension Ext:   1+ edema; adequate peripheral perfusion Skin:      Warm and dry; no rash Neuro: alert and oriented x 3 Psych: normal mood and affect   Labs/Imaging personally reviewed, significant for K 2.9, BUN/Cr 25/0.82 WBC 7.9, Hb 8.8, Plts 140 No new imaging  Assessment/plan: Acute resp failure Stable post extubation. Wean down Fio2  Colitis Surgery signed off. Will need stable removal in 3 weeks Empiric abx for 7 days Advance diet   Volume overload Repeat lasix '20mg'$  IV. Replete lytes   DC CVL and foley. Stable for transfer out of ICU and to hospitalist service  Marshell Garfinkel MD [facility] 07/10/2022, 11:58 AM

## 2022-07-11 DIAGNOSIS — A419 Sepsis, unspecified organism: Secondary | ICD-10-CM | POA: Diagnosis not present

## 2022-07-11 DIAGNOSIS — K529 Noninfective gastroenteritis and colitis, unspecified: Secondary | ICD-10-CM

## 2022-07-11 DIAGNOSIS — E861 Hypovolemia: Secondary | ICD-10-CM

## 2022-07-11 DIAGNOSIS — R531 Weakness: Secondary | ICD-10-CM

## 2022-07-11 DIAGNOSIS — R55 Syncope and collapse: Secondary | ICD-10-CM | POA: Diagnosis not present

## 2022-07-11 LAB — ECHOCARDIOGRAM COMPLETE
AR max vel: 1.96 cm2
AV Area VTI: 1.72 cm2
AV Area mean vel: 1.75 cm2
AV Mean grad: 4 mmHg
AV Peak grad: 6.7 mmHg
Ao pk vel: 1.29 m/s
Area-P 1/2: 2.83 cm2
Calc EF: 45.8 %
S' Lateral: 3.5 cm
Single Plane A2C EF: 43.4 %
Single Plane A4C EF: 55.5 %
Weight: 2758.4 oz

## 2022-07-11 LAB — BASIC METABOLIC PANEL
Anion gap: 8 (ref 5–15)
BUN: 12 mg/dL (ref 8–23)
CO2: 31 mmol/L (ref 22–32)
Calcium: 8 mg/dL — ABNORMAL LOW (ref 8.9–10.3)
Chloride: 103 mmol/L (ref 98–111)
Creatinine, Ser: 0.66 mg/dL (ref 0.44–1.00)
GFR, Estimated: >60 mL/min (ref >60)
Glucose, Bld: 102 mg/dL — ABNORMAL HIGH (ref 70–99)
Potassium: 3.1 mmol/L — ABNORMAL LOW (ref 3.5–5.1)
Sodium: 142 mmol/L (ref 135–145)

## 2022-07-11 LAB — CBC
HCT: 28 % — ABNORMAL LOW (ref 36.0–46.0)
Hemoglobin: 9.2 g/dL — ABNORMAL LOW (ref 12.0–15.0)
MCH: 31.3 pg (ref 26.0–34.0)
MCHC: 32.9 g/dL (ref 30.0–36.0)
MCV: 95.2 fL (ref 80.0–100.0)
Platelets: 188 10*3/uL (ref 150–400)
RBC: 2.94 MIL/uL — ABNORMAL LOW (ref 3.87–5.11)
RDW: 15.1 % (ref 11.5–15.5)
WBC: 14.4 10*3/uL — ABNORMAL HIGH (ref 4.0–10.5)
nRBC: 0.2 % (ref 0.0–0.2)

## 2022-07-11 LAB — GLUCOSE, CAPILLARY
Glucose-Capillary: 118 mg/dL — ABNORMAL HIGH (ref 70–99)
Glucose-Capillary: 122 mg/dL — ABNORMAL HIGH (ref 70–99)
Glucose-Capillary: 132 mg/dL — ABNORMAL HIGH (ref 70–99)
Glucose-Capillary: 137 mg/dL — ABNORMAL HIGH (ref 70–99)
Glucose-Capillary: 183 mg/dL — ABNORMAL HIGH (ref 70–99)
Glucose-Capillary: 51 mg/dL — ABNORMAL LOW (ref 70–99)
Glucose-Capillary: 84 mg/dL (ref 70–99)

## 2022-07-11 MED ORDER — METRONIDAZOLE 500 MG PO TABS
500.0000 mg | ORAL_TABLET | Freq: Two times a day (BID) | ORAL | Status: AC
  Administered 2022-07-11 – 2022-07-14 (×7): 500 mg via ORAL
  Filled 2022-07-11 (×7): qty 1

## 2022-07-11 MED ORDER — ALBUTEROL SULFATE HFA 108 (90 BASE) MCG/ACT IN AERS: 2.0000 | INHALATION_SPRAY | RESPIRATORY_TRACT | Status: DC | PRN

## 2022-07-11 MED ORDER — MAGNESIUM OXIDE -MG SUPPLEMENT 400 (240 MG) MG PO TABS
400.0000 mg | ORAL_TABLET | Freq: Two times a day (BID) | ORAL | Status: AC
  Administered 2022-07-11 (×2): 400 mg via ORAL
  Filled 2022-07-11 (×2): qty 1

## 2022-07-11 MED ORDER — BUPROPION HCL 75 MG PO TABS
75.0000 mg | ORAL_TABLET | Freq: Every day | ORAL | Status: DC
  Administered 2022-07-11 – 2022-07-16 (×6): 75 mg via ORAL
  Filled 2022-07-11 (×6): qty 1

## 2022-07-11 MED ORDER — METRONIDAZOLE 500 MG/100ML IV SOLN: 500.0000 mg | Freq: Three times a day (TID) | INTRAVENOUS | Status: DC

## 2022-07-11 MED ORDER — ASPIRIN 81 MG PO TBEC
81.0000 mg | DELAYED_RELEASE_TABLET | Freq: Every day | ORAL | Status: DC
  Administered 2022-07-11 – 2022-07-16 (×6): 81 mg via ORAL
  Filled 2022-07-11 (×5): qty 1

## 2022-07-11 MED ORDER — METOPROLOL TARTRATE 12.5 MG HALF TABLET: 12.5000 mg | ORAL_TABLET | Freq: Two times a day (BID) | ORAL | Status: DC

## 2022-07-11 MED ORDER — DULOXETINE HCL 30 MG PO CPEP
60.0000 mg | ORAL_CAPSULE | Freq: Every day | ORAL | Status: DC
  Administered 2022-07-11 – 2022-07-16 (×6): 60 mg via ORAL
  Filled 2022-07-11 (×6): qty 2

## 2022-07-11 MED ORDER — DEXTROSE 10 % IV SOLN: INTRAVENOUS | Status: DC

## 2022-07-11 MED ORDER — ROSUVASTATIN CALCIUM 5 MG PO TABS: 10.0000 mg | ORAL_TABLET | Freq: Every day | ORAL | Status: DC

## 2022-07-11 MED ORDER — DONEPEZIL HCL 5 MG PO TABS: 5.0000 mg | ORAL_TABLET | Freq: Every day | ORAL | Status: DC

## 2022-07-11 MED ORDER — METRONIDAZOLE 500 MG/100ML IV SOLN: 500.0000 mg | Freq: Two times a day (BID) | INTRAVENOUS | Status: DC

## 2022-07-11 MED ORDER — ALBUMIN HUMAN 25 % IV SOLN
12.5000 g | Freq: Once | INTRAVENOUS | Status: AC
  Administered 2022-07-11: 12.5 g via INTRAVENOUS
  Filled 2022-07-11 (×2): qty 50

## 2022-07-11 MED ORDER — PSYLLIUM 95 % PO PACK
1.0000 | PACK | Freq: Every day | ORAL | Status: DC
  Administered 2022-07-11 – 2022-07-15 (×5): 1 via ORAL
  Filled 2022-07-11 (×5): qty 1

## 2022-07-11 MED ORDER — FUROSEMIDE 10 MG/ML IJ SOLN
20.0000 mg | Freq: Once | INTRAMUSCULAR | Status: DC
  Filled 2022-07-11: qty 2

## 2022-07-11 MED ORDER — ALBUTEROL SULFATE (2.5 MG/3ML) 0.083% IN NEBU: 2.5000 mg | INHALATION_SOLUTION | RESPIRATORY_TRACT | Status: DC | PRN

## 2022-07-11 MED ORDER — POTASSIUM CHLORIDE CRYS ER 20 MEQ PO TBCR
40.0000 meq | EXTENDED_RELEASE_TABLET | Freq: Four times a day (QID) | ORAL | Status: AC
  Administered 2022-07-11 (×2): 40 meq via ORAL
  Filled 2022-07-11 (×2): qty 2

## 2022-07-11 MED ORDER — FUROSEMIDE 10 MG/ML IJ SOLN
40.0000 mg | Freq: Two times a day (BID) | INTRAMUSCULAR | Status: DC
  Administered 2022-07-11 – 2022-07-12 (×3): 40 mg via INTRAVENOUS
  Filled 2022-07-11 (×3): qty 4

## 2022-07-11 MED ORDER — SODIUM CHLORIDE 0.9 % IV SOLN
2.0000 g | INTRAVENOUS | Status: AC
  Administered 2022-07-11 – 2022-07-14 (×4): 2 g via INTRAVENOUS
  Filled 2022-07-11 (×4): qty 20

## 2022-07-11 MED ORDER — LEVOTHYROXINE SODIUM 88 MCG PO TABS: 88.0000 ug | ORAL_TABLET | Freq: Every day | ORAL | Status: DC

## 2022-07-11 NOTE — Progress Notes (Signed)
eLink Physician-Brief Progress Note Patient Name: [name] DOB: Dec 25, 1940 MRN: [id number]   Date of Service  07/11/2022  HPI/Events of Note  Multiple issues: 1. Hypoglycemia - Blood glucose = 51. 2. Urinary retention - I/O cath resulted in 500 mL bladder contents. 3. Extremely edematous with blistering. Albumin = 1.6. Creatinine = 0.7.  eICU Interventions  Plan: D10W IV infusion at 40 mL/hour. I/O cath PRN.  25% Albumin 12.5 gm IV now. Lasix 20 mg IV 30 minutes after 25% Albumin IV infusion is complete.      Intervention Category Major Interventions: Other:  Lysle Dingwall 07/11/2022, 4:39 AM

## 2022-07-11 NOTE — Progress Notes (Signed)
IP rehab admissions - Noted from attending MD and from SW that patient prefers [facility] and has a bed offer from the facility.  I will sign off for CIR at this time.  [phone]

## 2022-07-11 NOTE — NC FL2 (Signed)
[place] LEVEL OF CARE FORM     IDENTIFICATION  Patient Name: [name] Birthdate: 1941/02/06 Sex: female Admission Date (Current Location): 07/05/2022  [facility] and [place] Number:  Herbalist and Address:  [facility], [place] [address], [place], [place] [address]      Provider Number: [id number]  Attending Physician Name and Address:  Charlynne Cousins, MD  Relative Name and Phone Number:       Current Level of Care: Hospital Recommended Level of Care: [place] Prior Approval Number:    Date Approved/Denied:   PASRR Number: [id number]  Discharge Plan: SNF    Current Diagnoses: Patient Active Problem List   Diagnosis Date Noted   Malnutrition of moderate degree 07/10/2022   Septic shock ([place]) 07/05/2022   Hypovolemia 07/05/2022   Atrioventricular block, Mobitz type 1, 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   Dizziness 06/27/2022   Prolonged Q-T interval on ECG 06/27/2022   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   TIA (transient ischemic attack) 04/30/2022   Acute respiratory failure with hypoxia ([place]) 05/11/2020   Right upper lobe pneumonia 05/11/2020   Hyponatremia 05/11/2020   GERD (gastroesophageal reflux disease)    Anxiety with depression    Anxiety 11/14/2016   Swelling 06/18/2016   Hypothyroidism 05/17/2016   Abnormal CT scan 12/28/2015   Chronic low back pain 04/13/2015   Cervical disc disorder with radiculopathy of cervical region 06/20/2014   Bursitis of right shoulder 05/04/2014   Insomnia 04/21/2013   Routine health maintenance 01/28/2013   Essential hypertension 01/09/2012   Memory loss or impairment 12/02/2010   Fibromyalgia 05/04/2007    Orientation RESPIRATION BLADDER Height & Weight     Self, Place, Situation (fluctuating orientation)  O2 Incontinent Weight: 176 lb 9.4 oz (80.1 kg) Height:     BEHAVIORAL SYMPTOMS/MOOD NEUROLOGICAL BOWEL NUTRITION STATUS      Incontinent    AMBULATORY  STATUS COMMUNICATION OF NEEDS Skin   Extensive Assist Verbally Normal                       Personal Care Assistance Level of Assistance  Bathing, Feeding, Dressing Bathing Assistance: Maximum assistance Feeding assistance: Limited assistance Dressing Assistance: Maximum assistance Total Care Assistance: Maximum assistance   Functional Limitations Info  Sight, Hearing, Speech Sight Info: Adequate Hearing Info: Adequate Speech Info: Adequate    SPECIAL CARE FACTORS FREQUENCY  PT (By licensed PT), OT (By licensed OT)                    Contractures Contractures Info: Not present    Additional Factors Info  Code Status Code Status Info: DNR             Current Medications (07/11/2022):  This is the current hospital active medication list Current Facility-Administered Medications  Medication Dose Route Frequency Provider Last Rate Last Admin   0.9 %  sodium chloride infusion  250 mL Intravenous Continuous Cristie Hem, MD 10 mL/hr at 07/11/22 0054 250 mL at 07/11/22 0054   acetaminophen (TYLENOL) tablet 1,000 mg  1,000 mg Oral Q8H Nani Gasser, MD   1,000 mg at 07/11/22 0603   ALPRAZolam Duanne Moron) tablet 0.5 mg  0.5 mg Oral BID PRN Nani Gasser, MD   0.5 mg at 07/11/22 4388   aspirin EC tablet 81 mg  81 mg Oral Daily Charlynne Cousins, MD       buPROPion [facility]) tablet 75 mg  75 mg  Oral Daily Charlynne Cousins, MD       cefTRIAXone (ROCEPHIN) 2 g in sodium chloride 0.9 % 100 mL IVPB  2 g Intravenous Q24H Charlynne Cousins, MD       Chlorhexidine Gluconate Cloth 2 % PADS 6 each  6 each Topical Daily Brand Males, MD   6 each at 07/11/22 0951   dextrose 10 % infusion   Intravenous Continuous Charlynne Cousins, MD 10 mL/hr at 07/11/22 0951 Rate Change at 07/11/22 0951   DULoxetine (CYMBALTA) DR capsule 60 mg  60 mg Oral Daily Charlynne Cousins, MD   60 mg at 07/11/22 0950   feeding supplement (ENSURE ENLIVE / ENSURE PLUS) liquid  237 mL  237 mL Oral TID BM Mannam, Praveen, MD   237 mL at 07/11/22 0952   furosemide (LASIX) injection 40 mg  40 mg Intravenous BID Charlynne Cousins, MD   40 mg at 07/11/22 0952   heparin injection 5,000 Units  5,000 Units Subcutaneous Q8H Desai, Rahul P, PA-C   5,000 Units at 07/11/22 0603   insulin aspart (novoLOG) injection 0-15 Units  0-15 Units Subcutaneous Q4H Brand Males, MD   3 Units at 07/11/22 0955   levothyroxine (SYNTHROID) tablet 75 mcg  75 mcg Per Tube [id number] Brand Males, MD   75 mcg at 07/11/22 0603   magnesium oxide (MAG-OX) tablet 400 mg  400 mg Oral BID Charlynne Cousins, MD   400 mg at 07/11/22 0951   metroNIDAZOLE (FLAGYL) tablet 500 mg  500 mg Oral Q12H Donnamae Jude, RPH   500 mg at 07/11/22 8466   Oral care mouth rinse  15 mL Mouth Rinse 4 times per day Mannam, Hart Robinsons, MD   15 mL at 07/11/22 0952   Oral care mouth rinse  15 mL Mouth Rinse PRN Mannam, Praveen, MD       oxyCODONE (Oxy IR/ROXICODONE) immediate release tablet 5 mg  5 mg Oral Q6H PRN Nani Gasser, MD   5 mg at 07/11/22 0951   pantoprazole (PROTONIX) EC tablet 40 mg  40 mg Oral QHS Ventura Sellers, RPH   40 mg at 07/10/22 2123   potassium chloride SA (KLOR-CON M) CR tablet 40 mEq  40 mEq Oral Q6H Charlynne Cousins, MD   40 mEq at 07/11/22 0950   psyllium (HYDROCIL/METAMUCIL) 1 packet  1 packet Oral Daily Aileen Fass, Tammi Klippel, MD       sodium chloride flush (NS) 0.9 % injection 10-40 mL  10-40 mL Intracatheter Q12H Mannam, Praveen, MD   10 mL at 07/11/22 0952   sodium chloride flush (NS) 0.9 % injection 10-40 mL  10-40 mL Intracatheter PRN Mannam, Praveen, MD   10 mL at 07/11/22 0404     Discharge Medications: Please see discharge summary for a list of discharge medications.  Relevant Imaging Results:  Relevant Lab Results:   Additional Information SS# [id number]  Amador Cunas, [place]

## 2022-07-11 NOTE — Progress Notes (Signed)
Per MD, pt's family does not want CIR and is requesting [facility]. Bed offer received from [place] at [facility]. Spoke to pt's dtr [name] who confirmed their preference for SNF and accepted offer from [facility].   EDD is next week per MD. Will need to submit for auth closer to dc.   Wandra Feinstein, MSW, LCSW [phone] (coverage)

## 2022-07-11 NOTE — Care Management Important Message (Signed)
Important Message  Patient Details  Name: [name] MRN: [id number] Date of Birth: 24-Jan-1941   Medicare Important Message Given:  Yes     Hannah Beat 07/11/2022, 12:58 PM

## 2022-07-11 NOTE — Inpatient Diabetes Management (Signed)
Latest Reference Range & Units 07/11/22 00:17 07/11/22 03:51 07/11/22 04:50 07/11/22 09:18  Glucose-Capillary 70 - 99 mg/dL 132 (H) 51 (L) 122 (H) 183 (H)  (H): Data is abnormally high (L): Data is abnormally low  Note that CBGs have been down to 51 mg/dl.   Recommend changing Novolog correction scale to SENSITIVE (0-9 units) if blood sugars continue to be less than 70 mg/dl.   Harvel Ricks RN BSN CDE Diabetes Coordinator Pager: [phone]  8am-5pm

## 2022-07-11 NOTE — Progress Notes (Signed)
[facility] PROGRESS NOTE    Progress Note  [name]  VEL:[id number] DOB: 1941/05/26 DOA: 07/05/2022 PCP: Reynold Bowen, MD     Brief Narrative:   [name] is an 81 y.o. female past medical history fibromyalgia, essential hypertension, anxiety and depression history of TIA and AV block Mobitz type I who comes in with shock status post lap chole for concerns of bowel perforation, which was unremarkable admitted to the ICU placed on Levophed has been weaned off and extubated was started empirically on Rocephin and Flagyl on 07/05/2022   Assessment/Plan:   Severe Septic shock ([place]) General surgery was consulted Status post exploratory laparotomy on 07/05/2022 due to concern of perforated bowel, abdominal exam is now stable. Tolerating her diet. Continue Rocephin and Flagyl for 10 days. Follow-up with general surgery as an outpatient. She is to weigh around 67 kg to 70 kg few days ago, now 80 kg  She appears significant fluid overload on physical exam, neck veins are up. Will start on IV Lasix twice a day monitor strict I's and O's and daily weights monitor electrolytes replete as needed. Family would like to advance from skilled nursing facility.  Acute respiratory failure with hypoxia: Intubated on admission now has been extubated and has been weaned down to 3 L of oxygen. Out of bed to chair continue to wean to room air.  Infectious colitis: Continue.  Antibiotics for 10 days. Tolerating her diet we will follow-up with surgery as an outpatient. Has a rectal tube putting out large amounts of watery stool prodrome Metamucil.  Leukocytosis: Likely due to infectious colitis, Tmax of 99.6. White count is 14.4. She is currently on Rocephin and Flagyl. Culture data has remained negative. Will continue antibiotics for at least 10 days.  Volume overload: She was started on IV Lasix, positive about 15 L.  Estimated dry weight is around 68 to 70 kg. 2 days ago  she weighed 80 kg.  Malnutrition of moderate degree Noted.  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 Advanced dementia: Resume Aricept and the nausea pill.     DVT prophylaxis: lovenox Family Communication:none Status is: Inpatient Remains inpatient appropriate because: Septic shock question ischemic colitis    Code Status:     Code Status Orders  (From admission, onward)           Start     Ordered   07/05/22 1424  Do not attempt resuscitation (DNR)  Continuous       Question Answer Comment  In the event of cardiac or respiratory ARREST Do not call a "code blue"   In the event of cardiac or respiratory ARREST Do not perform Intubation, CPR, defibrillation or ACLS   In the event of cardiac or respiratory ARREST Use medication by any route, position, wound care, and other measures to relive pain and suffering. May use oxygen, suction and manual treatment of airway obstruction as needed for comfort.   Comments Vasopressors ok short term      07/05/22 1425           Code Status History     Date Active Date Inactive Code Status Order ID Comments User Context   04/30/2022 2127 05/01/2022 2012 DNR [id number]  Toy Baker, MD Inpatient   05/11/2020 2259 05/18/2020 1634 DNR [id number]  Lenore Cordia, MD ED   01/09/2012 2342 01/11/2012 1140 Full Code [id number]  Alanson Puls, RN Inpatient  IV Access:   Peripheral IV   Procedures and diagnostic studies:   No results found.   Medical Consultants:   None.   Subjective:    [name] comfortable this morning no complaints.  Objective:    Vitals:   07/10/22 1630 07/10/22 1700 07/10/22 1726 07/11/22 0415  BP: 115/60 117/74 (!) 148/70 (!) 143/60  Pulse: 91 90 93 (!) 101  Resp: '18 19 18 17  '$ Temp:   98.5 F (36.9 C) 99.6 F (37.6 C)  TempSrc:   Oral Oral  SpO2: 99% 94% 98%   Weight:       SpO2: 98 % O2 Flow  Rate (L/min): 3 L/min FiO2 (%): 40 %   Intake/Output Summary (Last 24 hours) at 07/11/2022 0733 Last data filed at 07/11/2022 0400 Gross per 24 hour  Intake 2205.35 ml  Output 1940 ml  Net 265.35 ml   Filed Weights   07/05/22 1000 07/06/22 0500 07/10/22 0401  Weight: 68.8 kg 78.2 kg 80.1 kg    Exam: General exam: In no acute distress. Respiratory system: Good air movement and clear to auscultation. Cardiovascular system: S1 & S2 heard, RRR.  Positive JVD Gastrointestinal system: Abdomen is nondistended, soft and nontender.  Edema on her straight Extremities: 3+ edema Skin: No rashes, lesions or ulcers Psychiatry: Judgement and insight appear normal. Mood & affect appropriate.    Data Reviewed:    Labs: Basic Metabolic Panel: Recent Labs  Lab 07/07/22 0323 07/07/22 0449 07/07/22 1709 07/07/22 1743 07/08/22 0400 07/08/22 1748 07/09/22 0406 07/10/22 0355 07/10/22 1500 07/11/22 0358  NA 138   < >  --    < > 135  --  136 140 142 142  K 3.6   < >  --    < > 3.6  --  3.2* 2.9* 3.5 3.1*  CL 97*  --   --   --  96*  --  100 104 105 103  CO2 28  --   --   --  27  --  '26 29 29 31  '$ GLUCOSE 162*  --   --   --  149*  --  130* 108* 174* 102*  BUN 35*  --   --   --  39*  --  51* 25* 17 12  CREATININE 1.19*  --   --   --  1.34*  --  1.52* 0.82 0.70 0.66  CALCIUM 7.2*  --   --   --  7.0*  --  7.1* 7.4* 7.4* 8.0*  MG 2.9*  --  2.8*  --  2.8* 3.1* 3.2*  --  1.9  --   PHOS 5.1*  --  4.2  --  4.2 3.3 3.5  --   --   --    < > = values in this interval not displayed.   GFR Estimated Creatinine Clearance: 54.1 mL/min (by C-G formula based on SCr of 0.66 mg/dL). Liver Function Tests: Recent Labs  Lab 07/06/22 1120 07/07/22 0323 07/08/22 0400 07/09/22 0406 07/10/22 0355  AST 50* 59* 64* 59* 64*  ALT 32 32 '26 21 20  '$ ALKPHOS 186* 210* 188* 182* 158*  BILITOT 0.6 0.5 0.5 <0.1* 0.4  PROT 4.6* 4.3* 4.3* 4.1* 4.1*  ALBUMIN 2.8* 2.4* 2.1* 1.8* 1.6*   Recent Labs  Lab  07/06/22 0021  LIPASE 28   No results for input(s): "AMMONIA" in the last 168 hours. Coagulation profile Recent Labs  Lab 07/05/22 1055 07/06/22 0021 07/06/22 1120 07/07/22  6712 07/08/22 0400  INR 1.0 1.3* 1.5* 1.8* 1.3*   COVID-19 Labs  No results for input(s): "DDIMER", "FERRITIN", "LDH", "CRP" in the last 72 hours.  Lab Results  Component Value Date   [place] NEGATIVE 05/11/2020    CBC: Recent Labs  Lab 07/05/22 1055 07/05/22 1058 07/07/22 0323 07/07/22 0449 07/07/22 2125 07/08/22 0400 07/09/22 0406 07/10/22 0355 07/11/22 0358  WBC 5.9   < > 7.3  --   --  9.5 5.4 7.9 14.4*  NEUTROABS 1.9  --   --   --   --   --   --   --   --   HGB 16.3*   < > 11.0*   < > 10.5* 10.2* 9.1* 8.8* 9.2*  HCT 49.8*   < > 34.4*   < > 31.0* 31.7* 27.3* 27.8* 28.0*  MCV 93.6   < > 95.8  --   --  96.1 94.8 96.2 95.2  PLT 300   < > 184  --   --  173 145* 140* 188   < > = values in this interval not displayed.   Cardiac Enzymes: Recent Labs  Lab 07/06/22 0021 07/08/22 0400  CKTOTAL 91 295*  CKMB 6.7* 4.1   BNP (last 3 results) No results for input(s): "PROBNP" in the last 8760 hours. CBG: Recent Labs  Lab 07/10/22 1505 07/10/22 2152 07/11/22 0017 07/11/22 0351 07/11/22 0450  GLUCAP 164* 108* 132* 51* 122*   D-Dimer: No results for input(s): "DDIMER" in the last 72 hours. Hgb A1c: No results for input(s): "HGBA1C" in the last 72 hours. Lipid Profile: No results for input(s): "CHOL", "HDL", "LDLCALC", "TRIG", "CHOLHDL", "LDLDIRECT" in the last 72 hours. Thyroid function studies: No results for input(s): "TSH", "T4TOTAL", "T3FREE", "THYROIDAB" in the last 72 hours.  Invalid input(s): "FREET3" Anemia work up: No results for input(s): "VITAMINB12", "FOLATE", "FERRITIN", "TIBC", "IRON", "RETICCTPCT" in the last 72 hours. Sepsis Labs: Recent Labs  Lab 07/07/22 0323 07/07/22 0744 07/07/22 2130 07/08/22 0400 07/09/22 0406 07/09/22 0947 07/09/22 1140  07/10/22 0355 07/11/22 0358  PROCALCITON 31.94  --   --   --   --   --   --   --   --   WBC 7.3  --   --  9.5 5.4  --   --  7.9 14.4*  LATICACIDVEN 2.8*   < > 3.1* 3.1*  --  1.6 1.3  --   --    < > = values in this interval not displayed.   Microbiology Recent Results (from the past 240 hour(s))  Blood culture (routine x 2)     Status: None   Collection Time: 07/05/22  1:20 PM   Specimen: BLOOD LEFT FOREARM  Result Value Ref Range Status   Specimen Description BLOOD LEFT FOREARM  Final   Special Requests   Final    BOTTLES DRAWN AEROBIC AND ANAEROBIC Blood Culture adequate volume   Culture   Final    NO GROWTH 5 DAYS Performed at [facility], [address], [place], [place] [address]    Report Status 07/10/2022 FINAL  Final  Blood culture (routine x 2)     Status: None   Collection Time: 07/05/22  1:25 PM   Specimen: BLOOD  Result Value Ref Range Status   Specimen Description BLOOD BLOOD RIGHT FOREARM  Final   Special Requests   Final    BOTTLES DRAWN AEROBIC AND ANAEROBIC Blood Culture results may not be optimal due  to an inadequate volume of blood received in culture bottles   Culture   Final    NO GROWTH 5 DAYS Performed at [facility], [place] [address] [place], [place] [address]    Report Status 07/10/2022 FINAL  Final  Urine Culture     Status: None   Collection Time: 07/05/22  2:05 PM   Specimen: In/Out Cath Urine  Result Value Ref Range Status   Specimen Description IN/OUT CATH URINE  Final   Special Requests NONE  Final   Culture   Final    NO GROWTH Performed at [facility], [place] [address] [place], [place] [address]    Report Status 07/06/2022 FINAL  Final  MRSA Next Gen by PCR, Nasal     Status: None   Collection Time: 07/05/22  7:45 PM   Specimen: Nasal Mucosa; Nasal Swab  Result Value Ref Range Status   MRSA by PCR Next Gen NOT DETECTED NOT DETECTED Final    Comment: (NOTE) The GeneXpert MRSA Assay (FDA approved for NASAL specimens  only), is one component of a comprehensive MRSA colonization surveillance program. It is not intended to diagnose MRSA infection nor to guide or monitor treatment for MRSA infections. Test performance is not FDA approved in patients less than 32 years old. Performed at [facility], [place] [address], [place], [place] [address]   Urine Culture     Status: None   Collection Time: 07/06/22  4:40 PM   Specimen: In/Out Cath Urine  Result Value Ref Range Status   Specimen Description IN/OUT CATH URINE  Final   Special Requests NONE  Final   Culture   Final    NO GROWTH Performed at [facility], [place] [address], [place], [place] [address]    Report Status 07/07/2022 FINAL  Final     Medications:    acetaminophen  1,000 mg Oral Q8H   aspirin  81 mg Per Tube Daily   Chlorhexidine Gluconate Cloth  6 each Topical Daily   feeding supplement  237 mL Oral TID BM   furosemide  20 mg Intravenous Once   heparin  5,000 Units Subcutaneous Q8H   insulin aspart  0-15 Units Subcutaneous Q4H   levothyroxine  75 mcg Per Tube Q0600   mouth rinse  15 mL Mouth Rinse 4 times per day   pantoprazole  40 mg Oral QHS   sodium chloride flush  10-40 mL Intracatheter Q12H   Continuous Infusions:  sodium chloride 250 mL (07/11/22 0054)   cefTRIAXone (ROCEPHIN)  IV Stopped (07/10/22 1338)   dextrose 40 mL/hr at 07/11/22 0552   metronidazole 500 mg (07/11/22 0058)      LOS: 6 days   Charlynne Cousins  [facility]  07/11/2022, 7:33 AM

## 2022-07-12 ENCOUNTER — Other Ambulatory Visit (HOSPITAL_COMMUNITY): Payer: Self-pay

## 2022-07-12 DIAGNOSIS — K529 Noninfective gastroenteritis and colitis, unspecified: Secondary | ICD-10-CM | POA: Diagnosis not present

## 2022-07-12 DIAGNOSIS — R531 Weakness: Secondary | ICD-10-CM | POA: Diagnosis not present

## 2022-07-12 DIAGNOSIS — R55 Syncope and collapse: Secondary | ICD-10-CM | POA: Diagnosis not present

## 2022-07-12 DIAGNOSIS — A419 Sepsis, unspecified organism: Secondary | ICD-10-CM | POA: Diagnosis not present

## 2022-07-12 LAB — BASIC METABOLIC PANEL
Anion gap: 10 (ref 5–15)
BUN: 8 mg/dL (ref 8–23)
CO2: 30 mmol/L (ref 22–32)
Calcium: 7.7 mg/dL — ABNORMAL LOW (ref 8.9–10.3)
Chloride: 94 mmol/L — ABNORMAL LOW (ref 98–111)
Creatinine, Ser: 0.58 mg/dL (ref 0.44–1.00)
GFR, Estimated: >60 mL/min (ref >60)
Glucose, Bld: 140 mg/dL — ABNORMAL HIGH (ref 70–99)
Potassium: 3.4 mmol/L — ABNORMAL LOW (ref 3.5–5.1)
Sodium: 134 mmol/L — ABNORMAL LOW (ref 135–145)

## 2022-07-12 LAB — CBC WITH DIFFERENTIAL/PLATELET
Abs Immature Granulocytes: 0.1 10*3/uL — ABNORMAL HIGH (ref 0.00–0.07)
Basophils Absolute: 0 10*3/uL (ref 0.0–0.1)
Basophils Relative: 0 %
Eosinophils Absolute: 0.1 10*3/uL (ref 0.0–0.5)
Eosinophils Relative: 1 %
HCT: 27.4 % — ABNORMAL LOW (ref 36.0–46.0)
Hemoglobin: 8.9 g/dL — ABNORMAL LOW (ref 12.0–15.0)
Lymphocytes Relative: 3 %
Lymphs Abs: 0.4 10*3/uL — ABNORMAL LOW (ref 0.7–4.0)
MCH: 30.8 pg (ref 26.0–34.0)
MCHC: 32.5 g/dL (ref 30.0–36.0)
MCV: 94.8 fL (ref 80.0–100.0)
Metamyelocytes Relative: 1 %
Monocytes Absolute: 1.2 10*3/uL — ABNORMAL HIGH (ref 0.1–1.0)
Monocytes Relative: 9 %
Neutro Abs: 11.7 10*3/uL — ABNORMAL HIGH (ref 1.7–7.7)
Neutrophils Relative %: 86 %
Platelets: 240 10*3/uL (ref 150–400)
RBC: 2.89 MIL/uL — ABNORMAL LOW (ref 3.87–5.11)
RDW: 14.9 % (ref 11.5–15.5)
WBC: 13.6 10*3/uL — ABNORMAL HIGH (ref 4.0–10.5)
nRBC: 0 /100 WBC
nRBC: 0.2 % (ref 0.0–0.2)

## 2022-07-12 LAB — GLUCOSE, CAPILLARY
Glucose-Capillary: 125 mg/dL — ABNORMAL HIGH (ref 70–99)
Glucose-Capillary: 142 mg/dL — ABNORMAL HIGH (ref 70–99)
Glucose-Capillary: 146 mg/dL — ABNORMAL HIGH (ref 70–99)
Glucose-Capillary: 180 mg/dL — ABNORMAL HIGH (ref 70–99)
Glucose-Capillary: 52 mg/dL — ABNORMAL LOW (ref 70–99)
Glucose-Capillary: 65 mg/dL — ABNORMAL LOW (ref 70–99)

## 2022-07-12 MED ORDER — ALBUTEROL SULFATE HFA 108 (90 BASE) MCG/ACT IN AERS: 2.0000 | INHALATION_SPRAY | RESPIRATORY_TRACT | Status: DC | PRN

## 2022-07-12 MED ORDER — POTASSIUM CHLORIDE CRYS ER 20 MEQ PO TBCR
40.0000 meq | EXTENDED_RELEASE_TABLET | Freq: Two times a day (BID) | ORAL | Status: AC
  Administered 2022-07-12: 40 meq via ORAL
  Filled 2022-07-12 (×2): qty 2

## 2022-07-12 MED ORDER — MAGIC MOUTHWASH
10.0000 mL | Freq: Three times a day (TID) | ORAL | Status: DC | PRN
  Filled 2022-07-12 (×2): qty 10

## 2022-07-12 MED ORDER — MAGNESIUM OXIDE -MG SUPPLEMENT 400 (240 MG) MG PO TABS
400.0000 mg | ORAL_TABLET | Freq: Two times a day (BID) | ORAL | Status: AC
  Administered 2022-07-12 (×2): 400 mg via ORAL
  Filled 2022-07-12 (×2): qty 1

## 2022-07-12 MED ORDER — MORPHINE SULFATE (PF) 4 MG/ML IV SOLN
4.0000 mg | INTRAVENOUS | Status: DC | PRN
  Administered 2022-07-12 – 2022-07-16 (×12): 4 mg via INTRAVENOUS
  Filled 2022-07-12 (×12): qty 1

## 2022-07-12 MED ORDER — ALBUMIN HUMAN 25 % IV SOLN
25.0000 g | Freq: Four times a day (QID) | INTRAVENOUS | Status: AC
  Administered 2022-07-12 – 2022-07-13 (×4): 25 g via INTRAVENOUS
  Filled 2022-07-12 (×4): qty 100

## 2022-07-12 MED ORDER — DEXTROSE 50 % IV SOLN
25.0000 mL | INTRAVENOUS | Status: AC
  Administered 2022-07-12: 25 mL via INTRAVENOUS
  Filled 2022-07-12: qty 50

## 2022-07-12 MED ORDER — FUROSEMIDE 10 MG/ML IJ SOLN
20.0000 mg | Freq: Two times a day (BID) | INTRAMUSCULAR | Status: DC
  Administered 2022-07-12 – 2022-07-13 (×2): 20 mg via INTRAVENOUS
  Filled 2022-07-12 (×2): qty 2

## 2022-07-12 MED ORDER — POTASSIUM CHLORIDE 20 MEQ PO PACK
40.0000 meq | PACK | Freq: Once | ORAL | Status: AC
  Administered 2022-07-13: 40 meq via ORAL
  Filled 2022-07-12: qty 2

## 2022-07-12 NOTE — Progress Notes (Signed)
[facility] PROGRESS NOTE    Progress Note  [name]  QPR:[id number] DOB: April 28, 1941 DOA: 07/05/2022 PCP: Reynold Bowen, MD     Brief Narrative:   [name] is an 81 y.o. female past medical history fibromyalgia, essential hypertension, anxiety and depression history of TIA and AV block Mobitz type I who comes in with shock status post lap chole for concerns of bowel perforation, which was unremarkable admitted to the ICU placed on Levophed has been weaned off and extubated was started empirically on Rocephin and Flagyl on 07/05/2022   Assessment/Plan:   Severe Septic shock ([place]) General surgery was consulted Status post exploratory laparotomy on 07/05/2022 due to concern of perforated bowel. Tolerating her diet. Continue Rocephin and Flagyl for 10 days. Follow-up with general surgery as an outpatient.  Acute respiratory failure with hypoxia: Intubated on admission now has been extubated and has been weaned down to 3 L of oxygen. Out of bed to chair continue to wean to room air.  Acute diastolic heart failure: With the last 2D echo that showed an EF of 50% with grade 2 diastolic heart failure on 07/06/2022. Going back through the chart her weight is around 67 to 70 kg on 08/10/2022 she was around 80 kg she is about 15 L positive neck veins are up she appears fluid overloaded. Cont IV Lasix at a lower dose. Please TED hose. Monitor strict I's and O's and daily weights. Replete electrolytes as needed. Albumin is 1.6 which is probably contributing to her third spacing.  Infectious colitis: Continue.  Continue antibiotics for total 14 days. Tolerating her diet we will follow-up with surgery as an outpatient. Has a rectal tube putting out large amounts of watery stool prodrome Metamucil.  Leukocytosis: Likely due to infectious colitis, Tmax 99.4 Leukocytosis continues to improve. She is currently on Rocephin and Flagyl. Culture data has remained  negative. Will continue antibiotics for at least 10 days.  Malnutrition of moderate degree Noted.  Ensure 3 times daily.  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 Advanced dementia: Resume Aricept and the nausea pill.      DVT prophylaxis: lovenox Family Communication:none Status is: Inpatient Remains inpatient appropriate because: Septic shock question ischemic colitis    Code Status:     Code Status Orders  (From admission, onward)           Start     Ordered   07/05/22 1424  Do not attempt resuscitation (DNR)  Continuous       Question Answer Comment  In the event of cardiac or respiratory ARREST Do not call a "code blue"   In the event of cardiac or respiratory ARREST Do not perform Intubation, CPR, defibrillation or ACLS   In the event of cardiac or respiratory ARREST Use medication by any route, position, wound care, and other measures to relive pain and suffering. May use oxygen, suction and manual treatment of airway obstruction as needed for comfort.   Comments Vasopressors ok short term      07/05/22 1425           Code Status History     Date Active Date Inactive Code Status Order ID Comments User Context   04/30/2022 2127 05/01/2022 2012 DNR [id number]  Toy Baker, MD Inpatient   05/11/2020 2259 05/18/2020 1634 DNR [id number]  Lenore Cordia, MD ED   01/09/2012 2342 01/11/2012 1140 Full Code [id number]  Alanson Puls, RN Inpatient  IV Access:   Peripheral IV   Procedures and diagnostic studies:   No results found.   Medical Consultants:   None.   Subjective:    [name] appears stable but she relates her pain is worse this morning.  Objective:    Vitals:   07/11/22 2108 07/12/22 0500 07/12/22 0535 07/12/22 0719  BP: (!) 153/76  (!) 115/57 113/86  Pulse: 97  84 68  Resp: '18  20 16  '$ Temp: 99.4 F (37.4 C)  97.7 F (36.5 C) 97.6 F  (36.4 C)  TempSrc: Oral  Oral Oral  SpO2: 94%  95% 98%  Weight:  80.1 kg     SpO2: 98 % O2 Flow Rate (L/min): 2 L/min FiO2 (%): 40 %   Intake/Output Summary (Last 24 hours) at 07/12/2022 0937 Last data filed at 07/12/2022 0513 Gross per 24 hour  Intake --  Output 1440 ml  Net -1440 ml    Filed Weights   07/06/22 0500 07/10/22 0401 07/12/22 0500  Weight: 78.2 kg 80.1 kg 80.1 kg    Exam: General exam: In no acute distress. Respiratory system: Good air movement and clear to auscultation. Cardiovascular system: S1 & S2 heard, RRR. No JVD. Gastrointestinal system: Abdomen is nondistended, soft and nontender.  Extremities: 3+ lower extremity Skin: No rashes, lesions or ulcers Psychiatry: Judgement and insight appear normal. Mood & affect appropriate.   Data Reviewed:    Labs: Basic Metabolic Panel: Recent Labs  Lab 07/07/22 0323 07/07/22 0449 07/07/22 1709 07/07/22 1743 07/08/22 0400 07/08/22 1748 07/09/22 0406 07/10/22 0355 07/10/22 1500 07/11/22 0358 07/12/22 0327  NA 138   < >  --    < > 135  --  136 140 142 142 134*  K 3.6   < >  --    < > 3.6  --  3.2* 2.9* 3.5 3.1* 3.4*  CL 97*  --   --   --  96*  --  100 104 105 103 94*  CO2 28  --   --   --  27  --  '26 29 29 31 30  '$ GLUCOSE 162*  --   --   --  149*  --  130* 108* 174* 102* 140*  BUN 35*  --   --   --  39*  --  51* 25* '17 12 8  '$ CREATININE 1.19*  --   --   --  1.34*  --  1.52* 0.82 0.70 0.66 0.58  CALCIUM 7.2*  --   --   --  7.0*  --  7.1* 7.4* 7.4* 8.0* 7.7*  MG 2.9*  --  2.8*  --  2.8* 3.1* 3.2*  --  1.9  --   --   PHOS 5.1*  --  4.2  --  4.2 3.3 3.5  --   --   --   --    < > = values in this interval not displayed.    GFR Estimated Creatinine Clearance: 54.1 mL/min (by C-G formula based on SCr of 0.58 mg/dL). Liver Function Tests: Recent Labs  Lab 07/06/22 1120 07/07/22 0323 07/08/22 0400 07/09/22 0406 07/10/22 0355  AST 50* 59* 64* 59* 64*  ALT 32 32 '26 21 20  '$ ALKPHOS 186* 210* 188* 182*  158*  BILITOT 0.6 0.5 0.5 <0.1* 0.4  PROT 4.6* 4.3* 4.3* 4.1* 4.1*  ALBUMIN 2.8* 2.4* 2.1* 1.8* 1.6*    Recent Labs  Lab 07/06/22 0021  LIPASE 28  No results for input(s): "AMMONIA" in the last 168 hours. Coagulation profile Recent Labs  Lab 07/05/22 1055 07/06/22 0021 07/06/22 1120 07/07/22 0323 07/08/22 0400  INR 1.0 1.3* 1.5* 1.8* 1.3*    COVID-19 Labs  No results for input(s): "DDIMER", "FERRITIN", "LDH", "CRP" in the last 72 hours.  Lab Results  Component Value Date   [place] NEGATIVE 05/11/2020    CBC: Recent Labs  Lab 07/05/22 1055 07/05/22 1058 07/08/22 0400 07/09/22 0406 07/10/22 0355 07/11/22 0358 07/12/22 0327  WBC 5.9   < > 9.5 5.4 7.9 14.4* 13.6*  NEUTROABS 1.9  --   --   --   --   --  11.7*  HGB 16.3*   < > 10.2* 9.1* 8.8* 9.2* 8.9*  HCT 49.8*   < > 31.7* 27.3* 27.8* 28.0* 27.4*  MCV 93.6   < > 96.1 94.8 96.2 95.2 94.8  PLT 300   < > 173 145* 140* 188 240   < > = values in this interval not displayed.    Cardiac Enzymes: Recent Labs  Lab 07/06/22 0021 07/08/22 0400  CKTOTAL 91 295*  CKMB 6.7* 4.1    BNP (last 3 results) No results for input(s): "PROBNP" in the last 8760 hours. CBG: Recent Labs  Lab 07/11/22 1230 07/11/22 1606 07/11/22 2110 07/12/22 0042 07/12/22 0450  GLUCAP 84 137* 118* 146* 142*    D-Dimer: No results for input(s): "DDIMER" in the last 72 hours. Hgb A1c: No results for input(s): "HGBA1C" in the last 72 hours. Lipid Profile: No results for input(s): "CHOL", "HDL", "LDLCALC", "TRIG", "CHOLHDL", "LDLDIRECT" in the last 72 hours. Thyroid function studies: No results for input(s): "TSH", "T4TOTAL", "T3FREE", "THYROIDAB" in the last 72 hours.  Invalid input(s): "FREET3" Anemia work up: No results for input(s): "VITAMINB12", "FOLATE", "FERRITIN", "TIBC", "IRON", "RETICCTPCT" in the last 72 hours. Sepsis Labs: Recent Labs  Lab 07/07/22 0323 07/07/22 0744 07/07/22 2130 07/08/22 0400  07/09/22 0406 07/09/22 0947 07/09/22 1140 07/10/22 0355 07/11/22 0358 07/12/22 0327  PROCALCITON 31.94  --   --   --   --   --   --   --   --   --   WBC 7.3  --   --  9.5 5.4  --   --  7.9 14.4* 13.6*  LATICACIDVEN 2.8*   < > 3.1* 3.1*  --  1.6 1.3  --   --   --    < > = values in this interval not displayed.    Microbiology Recent Results (from the past 240 hour(s))  Blood culture (routine x 2)     Status: None   Collection Time: 07/05/22  1:20 PM   Specimen: BLOOD LEFT FOREARM  Result Value Ref Range Status   Specimen Description BLOOD LEFT FOREARM  Final   Special Requests   Final    BOTTLES DRAWN AEROBIC AND ANAEROBIC Blood Culture adequate volume   Culture   Final    NO GROWTH 5 DAYS Performed at [facility], [address] [place], [place] [address]    Report Status 07/10/2022 FINAL  Final  Blood culture (routine x 2)     Status: None   Collection Time: 07/05/22  1:25 PM   Specimen: BLOOD  Result Value Ref Range Status   Specimen Description BLOOD BLOOD RIGHT FOREARM  Final   Special Requests   Final    BOTTLES DRAWN AEROBIC AND ANAEROBIC Blood Culture results may not be optimal due to an inadequate  volume of blood received in culture bottles   Culture   Final    NO GROWTH 5 DAYS Performed at [facility], [place] [address], [place], [place] [address]    Report Status 07/10/2022 FINAL  Final  Urine Culture     Status: None   Collection Time: 07/05/22  2:05 PM   Specimen: In/Out Cath Urine  Result Value Ref Range Status   Specimen Description IN/OUT CATH URINE  Final   Special Requests NONE  Final   Culture   Final    NO GROWTH Performed at [facility], [place] [address], [place], [place] [address]    Report Status 07/06/2022 FINAL  Final  MRSA Next Gen by PCR, Nasal     Status: None   Collection Time: 07/05/22  7:45 PM   Specimen: Nasal Mucosa; Nasal Swab  Result Value Ref Range Status   MRSA by PCR Next Gen NOT DETECTED NOT DETECTED Final     Comment: (NOTE) The GeneXpert MRSA Assay (FDA approved for NASAL specimens only), is one component of a comprehensive MRSA colonization surveillance program. It is not intended to diagnose MRSA infection nor to guide or monitor treatment for MRSA infections. Test performance is not FDA approved in patients less than 29 years old. Performed at [facility], [place] [address], [place], [place] [address]   Urine Culture     Status: None   Collection Time: 07/06/22  4:40 PM   Specimen: In/Out Cath Urine  Result Value Ref Range Status   Specimen Description IN/OUT CATH URINE  Final   Special Requests NONE  Final   Culture   Final    NO GROWTH Performed at [facility], [place] [address] [place], [place] [address]    Report Status 07/07/2022 FINAL  Final     Medications:    acetaminophen  1,000 mg Oral Q8H   aspirin EC  81 mg Oral Daily   buPROPion  75 mg Oral Daily   Chlorhexidine Gluconate Cloth  6 each Topical Daily   DULoxetine  60 mg Oral Daily   feeding supplement  237 mL Oral TID BM   furosemide  40 mg Intravenous BID   heparin  5,000 Units Subcutaneous Q8H   insulin aspart  0-15 Units Subcutaneous Q4H   levothyroxine  75 mcg Per Tube Q0600   metroNIDAZOLE  500 mg Oral Q12H   mouth rinse  15 mL Mouth Rinse 4 times per day   pantoprazole  40 mg Oral QHS   psyllium  1 packet Oral Daily   sodium chloride flush  10-40 mL Intracatheter Q12H   Continuous Infusions:  sodium chloride 250 mL (07/11/22 0054)   cefTRIAXone (ROCEPHIN)  IV 2 g (07/11/22 1257)   dextrose 10 mL/hr at 07/11/22 0951      LOS: 7 days   Charlynne Cousins  [facility]  07/12/2022, 9:37 AM

## 2022-07-12 NOTE — Progress Notes (Signed)
Physical Therapy Treatment Patient Details Name: [name] MRN: [id number] DOB: 21-Jan-1941 Today's Date: 07/12/2022   History of Present Illness 78 F admitted 11/24 with Abd pain and syncope after straining for BM. Hypotensive with lactic acidosis, found to have colitis, sepsis. 11/24 Exploratory laparotomy with normal findings. Intubated 11/24-11/28. PMHx: constipation, fibromyalgia, anxiety, insomnia, GERD, AV block, HTN    PT Comments    Pt making progress but very slowly. Pt requesting [facility], agree that this is an appropriate d/c plan and updated in documentation. Pt requiring mod A for bed mobility. Maintains posterior lean in sitting, at one point losing balance all the way bkwds and requiring max A to come back upright. Posterior lean present in standing as well well with mod A +2 needed for sit>stand and ambulation, progressing to max A +2 as she fatigued and knees began to buckle. Pt ambulated 4' with RW. PT will continue to follow.    Recommendations for follow up therapy are one component of a multi-disciplinary discharge planning process, led by the attending physician.  Recommendations may be updated based on patient status, additional functional criteria and insurance authorization.  Follow Up Recommendations  Skilled nursing-short term rehab (<3 hours/day) (pt requests [facility]) Can patient physically be transported by private vehicle: No   Assistance Recommended at Discharge Frequent or constant Supervision/Assistance  Patient can return home with the following A lot of help with bathing/dressing/bathroom;Assistance with cooking/housework;Direct supervision/assist for medications management;Direct supervision/assist for financial management;Assist for transportation;Help with stairs or ramp for entrance;Two people to help with walking and/or transfers   Equipment Recommendations  Rolling walker (2 wheels);Wheelchair (measurements PT)    Recommendations for  Other Services       Precautions / Restrictions Precautions Precautions: Fall Restrictions Weight Bearing Restrictions: No     Mobility  Bed Mobility Overal bed mobility: Needs Assistance Bed Mobility: Sidelying to Sit, Rolling Rolling: Mod assist Sidelying to sit: Mod assist, +2 for physical assistance       General bed mobility comments: instructions on getting BLEs off EOB and rail use with  mod assist of 2 to complete    Transfers Overall transfer level: Needs assistance Equipment used: Rolling walker (2 wheels) Transfers: Sit to/from Stand Sit to Stand: Mod assist, +2 physical assistance   Step pivot transfers:  (increased to max assist of 2 from fatigue)       General transfer comment: patient demonstrated posterior leaning during transfers requiring mod assist +2 for power up    Ambulation/Gait Ambulation/Gait assistance: Mod assist, +2 physical assistance Gait Distance (Feet): 4 Feet Assistive device: Rolling walker (2 wheels) Gait Pattern/deviations: Decreased stride length, Wide base of support Gait velocity: decreased Gait velocity interpretation: <1.31 ft/sec, indicative of household ambulator   General Gait Details: pt has difficulty picking up feet, needed facilitation of wt shifting and vc's for pushing through RW. Had an especially hard time turning feet to sit in chair. Chair had to be pulled up behind pt as pt was fatigued to the point that knees started buckling   Stairs             Wheelchair Mobility    Modified Rankin (Stroke Patients Only)       Balance Overall balance assessment: Needs assistance Sitting-balance support: No upper extremity supported, Feet supported Sitting balance-Leahy Scale: Poor Sitting balance - Comments: posterior lean with one complete LOB needing max A to sit back up Postural control: Posterior lean Standing balance support: Bilateral upper extremity  supported, Reliant on assistive device for  balance Standing balance-Leahy Scale: Poor Standing balance comment: mod assist x2 due to posterior leaning                            Cognition Arousal/Alertness: Awake/alert Behavior During Therapy: WFL for tasks assessed/performed Overall Cognitive Status: Impaired/Different from baseline Area of Impairment: Orientation, Memory, Safety/judgement, Awareness, Problem solving, Following commands, Attention                 Orientation Level: Disoriented to, Situation Current Attention Level: Sustained Memory: Decreased short-term memory Following Commands: Follows one step commands with increased time Safety/Judgement: Decreased awareness of safety, Decreased awareness of deficits Awareness: Emergent Problem Solving: Slow processing, Requires verbal cues General Comments: followed directions with increased time. Has no memory of previous days and states that it's all a blur        Exercises      General Comments General comments (skin integrity, edema, etc.): VSS on 2L O2      Pertinent Vitals/Pain Pain Assessment Pain Assessment: Faces Faces Pain Scale: Hurts little more Pain Location: abdomen Pain Descriptors / Indicators: Grimacing Pain Intervention(s): Monitored during session    Home Living                          Prior Function            PT Goals (current goals can now be found in the care plan section) Acute Rehab PT Goals Patient Stated Goal: to return to prior level of mobility PT Goal Formulation: With patient/family Time For Goal Achievement: 07/24/22 Potential to Achieve Goals: Fair Progress towards PT goals: Progressing toward goals    Frequency    Min 3X/week      PT Plan Discharge plan needs to be updated    Co-evaluation PT/OT/SLP Co-Evaluation/Treatment: Yes Reason for Co-Treatment: Complexity of the patient's impairments (multi-system involvement);Necessary to address cognition/behavior during functional  activity;For patient/therapist safety PT goals addressed during session: Mobility/safety with mobility;Balance;Proper use of DME;Strengthening/ROM OT goals addressed during session: ADL's and self-care      AM-PAC PT "6 Clicks" Mobility   Outcome Measure  Help needed turning from your back to your side while in a flat bed without using bedrails?: A Lot Help needed moving from lying on your back to sitting on the side of a flat bed without using bedrails?: A Lot Help needed moving to and from a bed to a chair (including a wheelchair)?: Total Help needed standing up from a chair using your arms (e.g., wheelchair or bedside chair)?: Total Help needed to walk in hospital room?: Total Help needed climbing 3-5 steps with a railing? : Total 6 Click Score: 8    End of Session Equipment Utilized During Treatment: Oxygen;Gait belt Activity Tolerance: Patient tolerated treatment well Patient left: in chair;with call bell/phone within reach;with chair alarm set;with family/visitor present Nurse Communication: Mobility status PT Visit Diagnosis: Other abnormalities of gait and mobility (R26.89);Muscle weakness (generalized) (M62.81)     Time: 4008-6761 PT Time Calculation (min) (ACUTE ONLY): 39 min  Charges:  $Gait Training: 8-22 mins                     Leighton Roach, PT  Acute Rehab Services Secure chat preferred Office [place] 07/12/2022, 2:04 PM

## 2022-07-12 NOTE — Progress Notes (Addendum)
Vitals stable, no complained of pain. Flexiseal came out. New flexiseal applied. Still with liquid stool output.

## 2022-07-12 NOTE — Progress Notes (Signed)
Occupational Therapy Treatment Patient Details Name: [name] MRN: [id number] DOB: November 16, 1940 Today's Date: 07/12/2022   History of present illness 39 F admitted 11/24 with Abd pain and syncope after straining for BM. Hypotensive with lactic acidosis, found to have colitis, sepsis. 11/24 Exploratory laparotomy with normal findings. Intubated 11/24-11/28. PMHx: constipation, fibromyalgia, anxiety, insomnia, GERD, AV block, HTN   OT comments  Patient received in supine and agreeable to OT/PT session. Patient asking for required frequent cues and mod assist of 2 to get to EOB with use of rails and assistance for trunk and BLEs. Patient stood from EOB and with mod assist x2 and began to urinate. Patient assisted with bathing LB and changing socks with patient able to assist with peri area bathing while standing but required assistance for standing balance. Patient performed mobility with RW and mod assist x2 due to posterior leaning and max assist of 2 before sitting in recliner. Patient states desire to discharge to [facility] and rehab over [facility] and discharge recommendations have been changed. Acute OT to continue to follow.    Recommendations for follow up therapy are one component of a multi-disciplinary discharge planning process, led by the attending physician.  Recommendations may be updated based on patient status, additional functional criteria and insurance authorization.    Follow Up Recommendations  Skilled nursing-short term rehab (<3 hours/day)     Assistance Recommended at Discharge Frequent or constant Supervision/Assistance  Patient can return home with the following  A lot of help with walking and/or transfers;A lot of help with bathing/dressing/bathroom;Assistance with cooking/housework;Direct supervision/assist for medications management;Direct supervision/assist for financial management;Assist for transportation;Help with stairs or ramp for entrance   Equipment  Recommendations  Other (comment) (defer)    Recommendations for Other Services      Precautions / Restrictions Precautions Precautions: Fall Restrictions Weight Bearing Restrictions: No       Mobility Bed Mobility Overal bed mobility: Needs Assistance Bed Mobility: Sidelying to Sit   Sidelying to sit: Mod assist, +2 for physical assistance       General bed mobility comments: instructions on getting BLEs off EOB and rail use with  mod assist of 2 to complete    Transfers Overall transfer level: Needs assistance Equipment used: Rolling walker (2 wheels) Transfers: Sit to/from Stand, Bed to chair/wheelchair/BSC Sit to Stand: Mod assist, +2 physical assistance     Step pivot transfers: Mod assist, +2 physical assistance (increased to max assist of 2 from fatigue)     General transfer comment: patient demonstrated posterior leaning during transfers requiring mod assist of 2 to ambulate short distance to recliner and max assist of 2 at end of transfer due to fatigue     Balance Overall balance assessment: Needs assistance Sitting-balance support: No upper extremity supported, Feet supported Sitting balance-Leahy Scale: Fair (to poor) Sitting balance - Comments: required assistance for sitting balance initially and progressed to min guard/supervision but lost balance after prolonged sitting Postural control: Posterior lean Standing balance support: Bilateral upper extremity supported, Reliant on assistive device for balance Standing balance-Leahy Scale: Poor Standing balance comment: mod assist x2 due to posterior leaning                           ADL either performed or assessed with clinical judgement   ADL Overall ADL's : Needs assistance/impaired             Lower Body Bathing: Moderate assistance;Maximal assistance;Sit to/from stand  Lower Body Bathing Details (indicate cue type and reason): able to perform peri area bathing while standing with  assistance for balance, max assisst for bathing feet Upper Body Dressing : Minimal assistance;Sitting Upper Body Dressing Details (indicate cue type and reason): changed gown Lower Body Dressing: Maximal assistance;Sit to/from stand Lower Body Dressing Details (indicate cue type and reason): max assist to donn socks and mesh underwear               General ADL Comments: patient able to stand during self care tasks but was reliant on external support    Extremity/Trunk Assessment              Vision       Perception     Praxis      Cognition Arousal/Alertness: Awake/alert Behavior During Therapy: WFL for tasks assessed/performed Overall Cognitive Status: Impaired/Different from baseline Area of Impairment: Orientation, Memory, Safety/judgement, Awareness, Problem solving, Following commands, Attention                   Current Attention Level: Sustained Memory: Decreased short-term memory Following Commands: Follows one step commands with increased time Safety/Judgement: Decreased awareness of safety, Decreased awareness of deficits Awareness: Emergent Problem Solving: Slow processing, Requires verbal cues General Comments: followed directions with increased time        Exercises      Shoulder Instructions       General Comments      Pertinent Vitals/ Pain       Pain Assessment Pain Assessment: Faces Faces Pain Scale: Hurts little more Pain Location: abdomen Pain Descriptors / Indicators: Grimacing Pain Intervention(s): Monitored during session, RN gave pain meds during session, Repositioned  Home Living                                          Prior Functioning/Environment              Frequency  Min 2X/week        Progress Toward Goals  OT Goals(current goals can now be found in the care plan section)  Progress towards OT goals: Progressing toward goals  Acute Rehab OT Goals Patient Stated Goal: get  better OT Goal Formulation: With patient Time For Goal Achievement: 07/24/22 Potential to Achieve Goals: Good ADL Goals Pt Will Perform Grooming: with supervision;sitting;standing Pt Will Perform Lower Body Dressing: sit to/from stand;with adaptive equipment;with supervision Pt Will Transfer to Toilet: with supervision;ambulating;bedside commode Additional ADL Goal #1: Pt will complete bed mobility and maintain sitting balance at EOB for 5 minutes with supervision as precursor to ADLs. Additional ADL Goal #2: Pt will attend to and follow 3 step ADL task with supervision.  Plan Discharge plan remains appropriate    Co-evaluation    PT/OT/SLP Co-Evaluation/Treatment: Yes Reason for Co-Treatment: For patient/therapist safety;To address functional/ADL transfers   OT goals addressed during session: ADL's and self-care      AM-PAC OT "6 Clicks" Daily Activity     Outcome Measure   Help from another person eating meals?: A Little Help from another person taking care of personal grooming?: A Little Help from another person toileting, which includes using toliet, bedpan, or urinal?: A Lot Help from another person bathing (including washing, rinsing, drying)?: A Lot Help from another person to put on and taking off regular upper body clothing?: A Little Help from another person to put on  and taking off regular lower body clothing?: A Lot 6 Click Score: 15    End of Session Equipment Utilized During Treatment: Gait belt;Rolling walker (2 wheels);Oxygen (2 liters)  OT Visit Diagnosis: Other abnormalities of gait and mobility (R26.89);Muscle weakness (generalized) (M62.81);Pain;Other symptoms and signs involving cognitive function   Activity Tolerance Patient tolerated treatment well   Patient Left in chair;with call bell/phone within reach;with chair alarm set;with family/visitor present   Nurse Communication Mobility status        Time: 6811-5726 OT Time Calculation (min): 38  min  Charges: OT General Charges $OT Visit: 1 Visit OT Treatments $Self Care/Home Management : 8-22 mins $Therapeutic Activity: 8-22 mins  Lodema Hong, OTA Acute Rehabilitation Services  Office [phone]   Trixie Dredge 07/12/2022, 12:37 PM

## 2022-07-12 NOTE — Progress Notes (Addendum)
BG is 52, asymptomatic, complaining of pain in upper abdomen. Gave 2 cups of orange juice. NP Abigail notified.  2105H repeat BG 65, IVF rate increase and gave D50 as ordered. CBG every 2 hours till go up to 80.

## 2022-07-12 NOTE — TOC Benefit Eligibility Note (Signed)
Patient Teacher, English as a foreign language completed.    The patient is currently admitted and upon discharge could be taking Farxiga 10 mg.  The current 30 day co-pay is $47.00.   The patient is currently admitted and upon discharge could be taking Jardiance 10 mg.  The current 30 day co-pay is $47.00.   The patient is insured through [place], [place] Patient Advocate Specialist [place] Patient Advocate Team Direct Number: [phone]  Fax: [phone]

## 2022-07-12 NOTE — Progress Notes (Addendum)
Complaining of difficulty to talk, voice is hoarse and with productive cough. Explained to the patient that it's probably irritation post extubation. With little bit if swelling in left side of the throat but no redness. NP Abigail notified.

## 2022-07-13 DIAGNOSIS — R6521 Severe sepsis with septic shock: Secondary | ICD-10-CM | POA: Diagnosis not present

## 2022-07-13 DIAGNOSIS — K529 Noninfective gastroenteritis and colitis, unspecified: Secondary | ICD-10-CM | POA: Diagnosis not present

## 2022-07-13 DIAGNOSIS — A419 Sepsis, unspecified organism: Secondary | ICD-10-CM | POA: Diagnosis not present

## 2022-07-13 LAB — CBC WITH DIFFERENTIAL/PLATELET
Abs Immature Granulocytes: 0.1 10*3/uL — ABNORMAL HIGH (ref 0.00–0.07)
Basophils Absolute: 0 10*3/uL (ref 0.0–0.1)
Basophils Relative: 0 %
Eosinophils Absolute: 0.4 10*3/uL (ref 0.0–0.5)
Eosinophils Relative: 4 %
HCT: 24.3 % — ABNORMAL LOW (ref 36.0–46.0)
Hemoglobin: 7.6 g/dL — ABNORMAL LOW (ref 12.0–15.0)
Lymphocytes Relative: 8 %
Lymphs Abs: 0.9 10*3/uL (ref 0.7–4.0)
MCH: 30.2 pg (ref 26.0–34.0)
MCHC: 31.3 g/dL (ref 30.0–36.0)
MCV: 96.4 fL (ref 80.0–100.0)
Metamyelocytes Relative: 1 %
Monocytes Absolute: 0.2 10*3/uL (ref 0.1–1.0)
Monocytes Relative: 2 %
Neutro Abs: 9.1 10*3/uL — ABNORMAL HIGH (ref 1.7–7.7)
Neutrophils Relative %: 85 %
Platelets: 282 10*3/uL (ref 150–400)
RBC: 2.52 MIL/uL — ABNORMAL LOW (ref 3.87–5.11)
RDW: 14.9 % (ref 11.5–15.5)
WBC: 10.7 10*3/uL — ABNORMAL HIGH (ref 4.0–10.5)
nRBC: 0.3 % — ABNORMAL HIGH (ref 0.0–0.2)
nRBC: 1 /100 WBC — ABNORMAL HIGH

## 2022-07-13 LAB — BASIC METABOLIC PANEL
Anion gap: 10 (ref 5–15)
BUN: 5 mg/dL — ABNORMAL LOW (ref 8–23)
CO2: 29 mmol/L (ref 22–32)
Calcium: 8.3 mg/dL — ABNORMAL LOW (ref 8.9–10.3)
Chloride: 97 mmol/L — ABNORMAL LOW (ref 98–111)
Creatinine, Ser: 0.55 mg/dL (ref 0.44–1.00)
GFR, Estimated: >60 mL/min (ref >60)
Glucose, Bld: 173 mg/dL — ABNORMAL HIGH (ref 70–99)
Potassium: 4 mmol/L (ref 3.5–5.1)
Sodium: 136 mmol/L (ref 135–145)

## 2022-07-13 LAB — GLUCOSE, CAPILLARY
Glucose-Capillary: 106 mg/dL — ABNORMAL HIGH (ref 70–99)
Glucose-Capillary: 111 mg/dL — ABNORMAL HIGH (ref 70–99)
Glucose-Capillary: 127 mg/dL — ABNORMAL HIGH (ref 70–99)
Glucose-Capillary: 131 mg/dL — ABNORMAL HIGH (ref 70–99)
Glucose-Capillary: 146 mg/dL — ABNORMAL HIGH (ref 70–99)
Glucose-Capillary: 59 mg/dL — ABNORMAL LOW (ref 70–99)
Glucose-Capillary: 86 mg/dL (ref 70–99)

## 2022-07-13 MED ORDER — METOPROLOL TARTRATE 25 MG PO TABS: 25.0000 mg | ORAL_TABLET | Freq: Two times a day (BID) | ORAL | Status: DC

## 2022-07-13 MED ORDER — ALBUMIN HUMAN 25 % IV SOLN
25.0000 g | Freq: Four times a day (QID) | INTRAVENOUS | Status: AC
  Administered 2022-07-13 – 2022-07-14 (×4): 25 g via INTRAVENOUS
  Filled 2022-07-13 (×4): qty 100

## 2022-07-13 MED ORDER — METOPROLOL TARTRATE 12.5 MG HALF TABLET
12.5000 mg | ORAL_TABLET | Freq: Two times a day (BID) | ORAL | Status: DC
  Administered 2022-07-13 – 2022-07-16 (×7): 12.5 mg via ORAL
  Filled 2022-07-13 (×7): qty 1

## 2022-07-13 MED ORDER — MENTHOL 3 MG MT LOZG
1.0000 | LOZENGE | OROMUCOSAL | Status: DC | PRN
  Administered 2022-07-13 – 2022-07-15 (×2): 3 mg via ORAL
  Filled 2022-07-13 (×2): qty 9

## 2022-07-13 MED ORDER — FUROSEMIDE 10 MG/ML IJ SOLN
20.0000 mg | Freq: Every day | INTRAMUSCULAR | Status: DC
  Filled 2022-07-13 (×2): qty 2

## 2022-07-13 MED ORDER — GUAIFENESIN-DM 100-10 MG/5ML PO SYRP
10.0000 mL | ORAL_SOLUTION | ORAL | Status: AC | PRN
  Administered 2022-07-13 – 2022-07-14 (×4): 10 mL via ORAL
  Filled 2022-07-13 (×4): qty 10

## 2022-07-13 MED ORDER — FUROSEMIDE 10 MG/ML IJ SOLN: 40.0000 mg | Freq: Two times a day (BID) | INTRAMUSCULAR | Status: DC

## 2022-07-13 MED ORDER — FUROSEMIDE 10 MG/ML IJ SOLN: 20.0000 mg | Freq: Two times a day (BID) | INTRAMUSCULAR | Status: DC

## 2022-07-13 NOTE — Progress Notes (Signed)
[facility] PROGRESS NOTE    Progress Note  [name]  ATF:[id number] DOB: June 10, 1941 DOA: 07/05/2022 PCP: Reynold Bowen, MD     Brief Narrative:   [name] is an 81 y.o. female past medical history fibromyalgia, essential hypertension, anxiety and depression history of TIA and AV block Mobitz type I who comes in with shock status post lap chole for concerns of bowel perforation, which was unremarkable admitted to the ICU placed on Levophed has been weaned off and extubated was started empirically on Rocephin and Flagyl on 07/05/2022   Assessment/Plan:   Severe Septic shock ([place]) General surgery was consulted Status post exploratory laparotomy on 07/05/2022 due to concern of perforated bowel. Tolerating her diet. Continue Rocephin and Flagyl for at least 14 days . Follow-up with general surgery as an outpatient.  Acute respiratory failure with hypoxia: Intubated on admission now has been extubated and has been weaned down to 3 L of oxygen. Out of bed to chair continue to wean to room air. She has been weaned to room air with diuresis.  Acute diastolic heart failure: With the last 2D echo that showed an EF of 50% with grade 2 diastolic heart failure on 07/06/2022. Going back through the chart her weight is around 67 to 70 kg on 08/10/2022 she was around 80 kg. Weight is slowly coming down she is about 12 L positive creatinine. Electrolytes have remained stable continue Lasix at current dose. Continue TED hose, out of bed to chair. Monitor strict I's and O's and daily weights. Replete electrolytes as needed. Albumin is 1.6 which is probably contributing to her third spacing.  Infectious colitis: Continue.  Continue antibiotics for total 14 days. Tolerating her diet we will follow-up with surgery as an outpatient. Diarrhea has improved with Metamucil.  Leukocytosis: Likely due to infectious colitis, Tmax 99.4 Leukocytosis continues to improve. Will  continue to coverage Rocephin and Flagyl, she will need antibiotics for at least total of 14 days  Malnutrition of moderate degree Noted.  Ensure 3 times daily.  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 Advanced dementia: Resume Aricept and the nausea pill.      DVT prophylaxis: lovenox Family Communication:none Status is: Inpatient Remains inpatient appropriate because: Septic shock question ischemic colitis    Code Status:     Code Status Orders  (From admission, onward)           Start     Ordered   07/05/22 1424  Do not attempt resuscitation (DNR)  Continuous       Question Answer Comment  In the event of cardiac or respiratory ARREST Do not call a "code blue"   In the event of cardiac or respiratory ARREST Do not perform Intubation, CPR, defibrillation or ACLS   In the event of cardiac or respiratory ARREST Use medication by any route, position, wound care, and other measures to relive pain and suffering. May use oxygen, suction and manual treatment of airway obstruction as needed for comfort.   Comments Vasopressors ok short term      07/05/22 1425           Code Status History     Date Active Date Inactive Code Status Order ID Comments User Context   04/30/2022 2127 05/01/2022 2012 DNR [id number]  Toy Baker, MD Inpatient   05/11/2020 2259 05/18/2020 1634 DNR [id number]  Lenore Cordia, MD ED   01/09/2012 2342 01/11/2012 1140 Full Code [id number]  Alanson Puls, RN Inpatient         IV Access:   Peripheral IV   Procedures and diagnostic studies:   No results found.   Medical Consultants:   None.   Subjective:    [name] feels better this morning.  Objective:    Vitals:   07/12/22 2004 07/13/22 0428 07/13/22 0437 07/13/22 0750  BP: (!) 118/56 (!) 127/50  136/60  Pulse: 62 (!) 102  100  Resp: '17 18  18  '$ Temp: 97.6 F (36.4 C) 99.2 F (37.3 C)   97.8 F (36.6 C)  TempSrc: Oral Oral  Oral  SpO2: 94% 95%  98%  Weight:   79.9 kg    SpO2: 98 % O2 Flow Rate (L/min): 2 L/min FiO2 (%): 40 %   Intake/Output Summary (Last 24 hours) at 07/13/2022 0911 Last data filed at 07/13/2022 0435 Gross per 24 hour  Intake 490 ml  Output 1900 ml  Net -1410 ml    Filed Weights   07/10/22 0401 07/12/22 0500 07/13/22 0437  Weight: 80.1 kg 80.1 kg 79.9 kg    Exam: General exam: In no acute distress. Respiratory system: Good air movement and clear to auscultation. Cardiovascular system: S1 & S2 heard, RRR. No JVD. Gastrointestinal system: Abdomen is nondistended, soft and nontender.  Extremities: 2+ lower extremity edema Skin: No rashes, lesions or ulcers Psychiatry: Judgement and insight appear normal. Mood & affect appropriate.   Data Reviewed:    Labs: Basic Metabolic Panel: Recent Labs  Lab 07/07/22 0323 07/07/22 0449 07/07/22 1709 07/07/22 1743 07/08/22 0400 07/08/22 1748 07/09/22 0406 07/10/22 0355 07/10/22 1500 07/11/22 0358 07/12/22 0327 07/13/22 0317  NA 138   < >  --    < > 135  --  136 140 142 142 134* 136  K 3.6   < >  --    < > 3.6  --  3.2* 2.9* 3.5 3.1* 3.4* 4.0  CL 97*  --   --   --  96*  --  100 104 105 103 94* 97*  CO2 28  --   --   --  27  --  '26 29 29 31 30 29  '$ GLUCOSE 162*  --   --   --  149*  --  130* 108* 174* 102* 140* 173*  BUN 35*  --   --   --  39*  --  51* 25* '17 12 8 '$ 5*  CREATININE 1.19*  --   --   --  1.34*  --  1.52* 0.82 0.70 0.66 0.58 0.55  CALCIUM 7.2*  --   --   --  7.0*  --  7.1* 7.4* 7.4* 8.0* 7.7* 8.3*  MG 2.9*  --  2.8*  --  2.8* 3.1* 3.2*  --  1.9  --   --   --   PHOS 5.1*  --  4.2  --  4.2 3.3 3.5  --   --   --   --   --    < > = values in this interval not displayed.    GFR Estimated Creatinine Clearance: 54 mL/min (by C-G formula based on SCr of 0.55 mg/dL). Liver Function Tests: Recent Labs  Lab 07/06/22 1120 07/07/22 0323 07/08/22 0400 07/09/22 0406 07/10/22 0355   AST 50* 59* 64* 59* 64*  ALT 32 32 '26 21 20  '$ ALKPHOS 186* 210* 188* 182* 158*  BILITOT 0.6 0.5 0.5 <0.1* 0.4  PROT 4.6* 4.3*  4.3* 4.1* 4.1*  ALBUMIN 2.8* 2.4* 2.1* 1.8* 1.6*    No results for input(s): "LIPASE", "AMYLASE" in the last 168 hours.  No results for input(s): "AMMONIA" in the last 168 hours. Coagulation profile Recent Labs  Lab 07/06/22 1120 07/07/22 0323 07/08/22 0400  INR 1.5* 1.8* 1.3*    COVID-19 Labs  No results for input(s): "DDIMER", "FERRITIN", "LDH", "CRP" in the last 72 hours.  Lab Results  Component Value Date   [place] NEGATIVE 05/11/2020    CBC: Recent Labs  Lab 07/09/22 0406 07/10/22 0355 07/11/22 0358 07/12/22 0327 07/13/22 0317  WBC 5.4 7.9 14.4* 13.6* 10.7*  NEUTROABS  --   --   --  11.7* 9.1*  HGB 9.1* 8.8* 9.2* 8.9* 7.6*  HCT 27.3* 27.8* 28.0* 27.4* 24.3*  MCV 94.8 96.2 95.2 94.8 96.4  PLT 145* 140* 188 240 282    Cardiac Enzymes: Recent Labs  Lab 07/08/22 0400  CKTOTAL 295*  CKMB 4.1    BNP (last 3 results) No results for input(s): "PROBNP" in the last 8760 hours. CBG: Recent Labs  Lab 07/12/22 2105 07/12/22 2233 07/13/22 0018 07/13/22 0412 07/13/22 0748  GLUCAP 65* 180* 111* 127* 106*    D-Dimer: No results for input(s): "DDIMER" in the last 72 hours. Hgb A1c: No results for input(s): "HGBA1C" in the last 72 hours. Lipid Profile: No results for input(s): "CHOL", "HDL", "LDLCALC", "TRIG", "CHOLHDL", "LDLDIRECT" in the last 72 hours. Thyroid function studies: No results for input(s): "TSH", "T4TOTAL", "T3FREE", "THYROIDAB" in the last 72 hours.  Invalid input(s): "FREET3" Anemia work up: No results for input(s): "VITAMINB12", "FOLATE", "FERRITIN", "TIBC", "IRON", "RETICCTPCT" in the last 72 hours. Sepsis Labs: Recent Labs  Lab 07/07/22 0323 07/07/22 0744 07/07/22 2130 07/08/22 0400 07/09/22 0406 07/09/22 0947 07/09/22 1140 07/10/22 0355 07/11/22 0358 07/12/22 0327 07/13/22 0317  PROCALCITON  31.94  --   --   --   --   --   --   --   --   --   --   WBC 7.3  --   --  9.5   < >  --   --  7.9 14.4* 13.6* 10.7*  LATICACIDVEN 2.8*   < > 3.1* 3.1*  --  1.6 1.3  --   --   --   --    < > = values in this interval not displayed.    Microbiology Recent Results (from the past 240 hour(s))  Blood culture (routine x 2)     Status: None   Collection Time: 07/05/22  1:20 PM   Specimen: BLOOD LEFT FOREARM  Result Value Ref Range Status   Specimen Description BLOOD LEFT FOREARM  Final   Special Requests   Final    BOTTLES DRAWN AEROBIC AND ANAEROBIC Blood Culture adequate volume   Culture   Final    NO GROWTH 5 DAYS Performed at [facility], [address] [place], [place] [address]    Report Status 07/10/2022 FINAL  Final  Blood culture (routine x 2)     Status: None   Collection Time: 07/05/22  1:25 PM   Specimen: BLOOD  Result Value Ref Range Status   Specimen Description BLOOD BLOOD RIGHT FOREARM  Final   Special Requests   Final    BOTTLES DRAWN AEROBIC AND ANAEROBIC Blood Culture results may not be optimal due to an inadequate volume of blood received in culture bottles   Culture   Final    NO GROWTH 5  DAYS Performed at [facility], [place] [address], [place], [place] [address]    Report Status 07/10/2022 FINAL  Final  Urine Culture     Status: None   Collection Time: 07/05/22  2:05 PM   Specimen: In/Out Cath Urine  Result Value Ref Range Status   Specimen Description IN/OUT CATH URINE  Final   Special Requests NONE  Final   Culture   Final    NO GROWTH Performed at [facility], [place] [address], [place], [place] [address]    Report Status 07/06/2022 FINAL  Final  MRSA Next Gen by PCR, Nasal     Status: None   Collection Time: 07/05/22  7:45 PM   Specimen: Nasal Mucosa; Nasal Swab  Result Value Ref Range Status   MRSA by PCR Next Gen NOT DETECTED NOT DETECTED Final    Comment: (NOTE) The GeneXpert MRSA Assay (FDA approved for NASAL specimens  only), is one component of a comprehensive MRSA colonization surveillance program. It is not intended to diagnose MRSA infection nor to guide or monitor treatment for MRSA infections. Test performance is not FDA approved in patients less than 11 years old. Performed at [facility], [place] [address], [place], [place] [address]   Urine Culture     Status: None   Collection Time: 07/06/22  4:40 PM   Specimen: In/Out Cath Urine  Result Value Ref Range Status   Specimen Description IN/OUT CATH URINE  Final   Special Requests NONE  Final   Culture   Final    NO GROWTH Performed at [facility], [place] [address], [place], [place] [address]    Report Status 07/07/2022 FINAL  Final     Medications:    acetaminophen  1,000 mg Oral Q8H   aspirin EC  81 mg Oral Daily   buPROPion  75 mg Oral Daily   Chlorhexidine Gluconate Cloth  6 each Topical Daily   DULoxetine  60 mg Oral Daily   feeding supplement  237 mL Oral TID BM   furosemide  20 mg Intravenous BID   heparin  5,000 Units Subcutaneous Q8H   insulin aspart  0-15 Units Subcutaneous Q4H   levothyroxine  75 mcg Per Tube Q0600   metroNIDAZOLE  500 mg Oral Q12H   mouth rinse  15 mL Mouth Rinse 4 times per day   pantoprazole  40 mg Oral QHS   potassium chloride  40 mEq Oral BID   psyllium  1 packet Oral Daily   sodium chloride flush  10-40 mL Intracatheter Q12H   Continuous Infusions:  sodium chloride 250 mL (07/11/22 0054)   cefTRIAXone (ROCEPHIN)  IV 2 g (07/12/22 1329)   dextrose 25 mL/hr at 07/12/22 2134      LOS: 8 days   Charlynne Cousins  [facility]  07/13/2022, 9:11 AM

## 2022-07-13 NOTE — Progress Notes (Signed)
Complained of pain in under the left breast, and got worse when she cough. NP Abigail notified. Cough med prescribed.

## 2022-07-13 NOTE — Progress Notes (Signed)
Assisted to brush her teeth and washed hair.

## 2022-07-14 DIAGNOSIS — K529 Noninfective gastroenteritis and colitis, unspecified: Secondary | ICD-10-CM | POA: Diagnosis not present

## 2022-07-14 DIAGNOSIS — A419 Sepsis, unspecified organism: Secondary | ICD-10-CM | POA: Diagnosis not present

## 2022-07-14 DIAGNOSIS — R6521 Severe sepsis with septic shock: Secondary | ICD-10-CM | POA: Diagnosis not present

## 2022-07-14 LAB — BASIC METABOLIC PANEL
Anion gap: 10 (ref 5–15)
BUN: 6 mg/dL — ABNORMAL LOW (ref 8–23)
CO2: 27 mmol/L (ref 22–32)
Calcium: 8.8 mg/dL — ABNORMAL LOW (ref 8.9–10.3)
Chloride: 97 mmol/L — ABNORMAL LOW (ref 98–111)
Creatinine, Ser: 0.59 mg/dL (ref 0.44–1.00)
GFR, Estimated: >60 mL/min (ref >60)
Glucose, Bld: 124 mg/dL — ABNORMAL HIGH (ref 70–99)
Potassium: 3.8 mmol/L (ref 3.5–5.1)
Sodium: 134 mmol/L — ABNORMAL LOW (ref 135–145)

## 2022-07-14 LAB — GLUCOSE, CAPILLARY
Glucose-Capillary: 120 mg/dL — ABNORMAL HIGH (ref 70–99)
Glucose-Capillary: 121 mg/dL — ABNORMAL HIGH (ref 70–99)
Glucose-Capillary: 125 mg/dL — ABNORMAL HIGH (ref 70–99)
Glucose-Capillary: 141 mg/dL — ABNORMAL HIGH (ref 70–99)
Glucose-Capillary: 149 mg/dL — ABNORMAL HIGH (ref 70–99)
Glucose-Capillary: 149 mg/dL — ABNORMAL HIGH (ref 70–99)
Glucose-Capillary: 167 mg/dL — ABNORMAL HIGH (ref 70–99)

## 2022-07-14 LAB — CBC
HCT: 23.7 % — ABNORMAL LOW (ref 36.0–46.0)
Hemoglobin: 7.4 g/dL — ABNORMAL LOW (ref 12.0–15.0)
MCH: 30.8 pg (ref 26.0–34.0)
MCHC: 31.2 g/dL (ref 30.0–36.0)
MCV: 98.8 fL (ref 80.0–100.0)
Platelets: 366 10*3/uL (ref 150–400)
RBC: 2.4 MIL/uL — ABNORMAL LOW (ref 3.87–5.11)
RDW: 15.4 % (ref 11.5–15.5)
WBC: 10.4 10*3/uL (ref 4.0–10.5)
nRBC: 0.4 % — ABNORMAL HIGH (ref 0.0–0.2)

## 2022-07-14 MED ORDER — LIDOCAINE VISCOUS HCL 2 % MT SOLN
15.0000 mL | Freq: Four times a day (QID) | OROMUCOSAL | Status: DC | PRN
  Administered 2022-07-14 – 2022-07-15 (×2): 15 mL via OROMUCOSAL
  Filled 2022-07-14 (×3): qty 15

## 2022-07-14 MED ORDER — FLUCONAZOLE 100MG IVPB
100.0000 mg | INTRAVENOUS | Status: DC
  Administered 2022-07-15 – 2022-07-16 (×2): 100 mg via INTRAVENOUS
  Filled 2022-07-14 (×2): qty 50

## 2022-07-14 MED ORDER — FLUCONAZOLE IN SODIUM CHLORIDE 200-0.9 MG/100ML-% IV SOLN
200.0000 mg | Freq: Once | INTRAVENOUS | Status: AC
  Administered 2022-07-14: 200 mg via INTRAVENOUS
  Filled 2022-07-14 (×2): qty 100

## 2022-07-14 NOTE — Progress Notes (Addendum)
[facility] PROGRESS NOTE    Progress Note  [name]  YIR:[id number] DOB: 04/29/1941 DOA: 07/05/2022 PCP: Reynold Bowen, MD     Brief Narrative:   [name] is an 81 y.o. female past medical history fibromyalgia, essential hypertension, anxiety and depression history of TIA and AV block Mobitz type I who comes in with shock status post lap chole for concerns of bowel perforation, which was unremarkable admitted to the ICU placed on Levophed has been weaned off and extubated was started empirically on Rocephin and Flagyl on 07/05/2022   Assessment/Plan:   Severe Septic shock ([place]) General surgery was consulted Status post exploratory laparotomy on 07/05/2022 due to concern of perforated bowel. Tolerating her diet. Continue Rocephin and Flagyl for at least 14 days . Follow-up with general surgery as an outpatient.  Acute respiratory failure with hypoxia: Intubated on admission now has been extubated . Out of bed to chair continue to wean to room air. She has been weaned to room air with diuresis.  Acute diastolic heart failure: Going back through the chart her weight is around 67 to 70 kg on 08/10/2022 she was around 80 kg. Weight has stabilized she is positive about 12 L. Continue TED hose out of bed to chair. Monitor strict I's and O's and daily weights. Electrolytes have remained stable, hold Lasix. Albumin is 1.6 which is probably contributing to her third spacing.  Infectious colitis: Continue.  Continue antibiotics for total 14 days. Tolerating her diet we will follow-up with surgery as an outpatient. Diarrhea has improved with Metamucil.  Leukocytosis: Likely due to infectious colitis, Tmax 99.4 Leukocytosis continues to improve. Will continue to coverage Rocephin and Flagyl, she will need antibiotics for at least total of 14 days  Oral thrush: Start oral Diflucan.  Malnutrition of moderate degree Noted.  Ensure 3 times daily.  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 Advanced dementia: Resume Aricept and the nausea pill.      DVT prophylaxis: lovenox Family Communication:none Status is: Inpatient Remains inpatient appropriate because: Septic shock question ischemic colitis    Code Status:     Code Status Orders  (From admission, onward)           Start     Ordered   07/05/22 1424  Do not attempt resuscitation (DNR)  Continuous       Question Answer Comment  In the event of cardiac or respiratory ARREST Do not call a "code blue"   In the event of cardiac or respiratory ARREST Do not perform Intubation, CPR, defibrillation or ACLS   In the event of cardiac or respiratory ARREST Use medication by any route, position, wound care, and other measures to relive pain and suffering. May use oxygen, suction and manual treatment of airway obstruction as needed for comfort.   Comments Vasopressors ok short term      07/05/22 1425           Code Status History     Date Active Date Inactive Code Status Order ID Comments User Context   04/30/2022 2127 05/01/2022 2012 DNR [id number]  Toy Baker, MD Inpatient   05/11/2020 2259 05/18/2020 1634 DNR [id number]  Lenore Cordia, MD ED   01/09/2012 2342 01/11/2012 1140 Full Code [id number]  Alanson Puls, RN Inpatient         IV Access:   Peripheral IV   Procedures and diagnostic studies:   No results found.  Medical Consultants:   None.   Subjective:    [name] relates dysphagia  Objective:    Vitals:   07/13/22 2039 07/14/22 0355 07/14/22 0417 07/14/22 0821  BP: 132/65 129/60  (!) 133/58  Pulse: 100 95  (!) 105  Resp: '19 18  18  '$ Temp: 98.4 F (36.9 C) 99.5 F (37.5 C)  99.1 F (37.3 C)  TempSrc: Oral Oral    SpO2: 98% 94%  96%  Weight:   75 kg    SpO2: 96 % O2 Flow Rate (L/min): 2 L/min FiO2 (%): 40 %   Intake/Output Summary (Last 24 hours) at  07/14/2022 0900 Last data filed at 07/14/2022 0436 Gross per 24 hour  Intake 860 ml  Output 1000 ml  Net -140 ml    Filed Weights   07/12/22 0500 07/13/22 0437 07/14/22 0417  Weight: 80.1 kg 79.9 kg 75 kg    Exam: General exam: In no acute distress. Respiratory system: Good air movement and clear to auscultation. Cardiovascular system: S1 & S2 heard, RRR. No JVD. Gastrointestinal system: Abdomen is nondistended, soft and nontender.  Extremities: No pedal edema. Skin: No rashes, lesions or ulcers Psychiatry: Judgement and insight appear normal. Mood & affect appropriate.  Data Reviewed:    Labs: Basic Metabolic Panel: Recent Labs  Lab 07/07/22 1709 07/07/22 1743 07/08/22 0400 07/08/22 1748 07/09/22 0406 07/10/22 0355 07/10/22 1500 07/11/22 0358 07/12/22 0327 07/13/22 0317 07/14/22 0332  NA  --    < > 135  --  136   < > 142 142 134* 136 134*  K  --    < > 3.6  --  3.2*   < > 3.5 3.1* 3.4* 4.0 3.8  CL  --    < > 96*  --  100   < > 105 103 94* 97* 97*  CO2  --    < > 27  --  26   < > '29 31 30 29 27  '$ GLUCOSE  --    < > 149*  --  130*   < > 174* 102* 140* 173* 124*  BUN  --    < > 39*  --  51*   < > '17 12 8 '$ 5* 6*  CREATININE  --    < > 1.34*  --  1.52*   < > 0.70 0.66 0.58 0.55 0.59  CALCIUM  --    < > 7.0*  --  7.1*   < > 7.4* 8.0* 7.7* 8.3* 8.8*  MG 2.8*  --  2.8* 3.1* 3.2*  --  1.9  --   --   --   --   PHOS 4.2  --  4.2 3.3 3.5  --   --   --   --   --   --    < > = values in this interval not displayed.    GFR Estimated Creatinine Clearance: 52.3 mL/min (by C-G formula based on SCr of 0.59 mg/dL). Liver Function Tests: Recent Labs  Lab 07/08/22 0400 07/09/22 0406 07/10/22 0355  AST 64* 59* 64*  ALT '26 21 20  '$ ALKPHOS 188* 182* 158*  BILITOT 0.5 <0.1* 0.4  PROT 4.3* 4.1* 4.1*  ALBUMIN 2.1* 1.8* 1.6*    No results for input(s): "LIPASE", "AMYLASE" in the last 168 hours.  No results for input(s): "AMMONIA" in the last 168 hours. Coagulation  profile Recent Labs  Lab 07/08/22 0400  INR 1.3*    COVID-19 Labs  No  results for input(s): "DDIMER", "FERRITIN", "LDH", "CRP" in the last 72 hours.  Lab Results  Component Value Date   [place] NEGATIVE 05/11/2020    CBC: Recent Labs  Lab 07/09/22 0406 07/10/22 0355 07/11/22 0358 07/12/22 0327 07/13/22 0317  WBC 5.4 7.9 14.4* 13.6* 10.7*  NEUTROABS  --   --   --  11.7* 9.1*  HGB 9.1* 8.8* 9.2* 8.9* 7.6*  HCT 27.3* 27.8* 28.0* 27.4* 24.3*  MCV 94.8 96.2 95.2 94.8 96.4  PLT 145* 140* 188 240 282    Cardiac Enzymes: Recent Labs  Lab 07/08/22 0400  CKTOTAL 295*  CKMB 4.1    BNP (last 3 results) No results for input(s): "PROBNP" in the last 8760 hours. CBG: Recent Labs  Lab 07/13/22 1615 07/13/22 2036 07/14/22 0004 07/14/22 0400 07/14/22 0824  GLUCAP 86 146* 149* 125* 149*    D-Dimer: No results for input(s): "DDIMER" in the last 72 hours. Hgb A1c: No results for input(s): "HGBA1C" in the last 72 hours. Lipid Profile: No results for input(s): "CHOL", "HDL", "LDLCALC", "TRIG", "CHOLHDL", "LDLDIRECT" in the last 72 hours. Thyroid function studies: No results for input(s): "TSH", "T4TOTAL", "T3FREE", "THYROIDAB" in the last 72 hours.  Invalid input(s): "FREET3" Anemia work up: No results for input(s): "VITAMINB12", "FOLATE", "FERRITIN", "TIBC", "IRON", "RETICCTPCT" in the last 72 hours. Sepsis Labs: Recent Labs  Lab 07/07/22 2130 07/08/22 0400 07/09/22 0406 07/09/22 0947 07/09/22 1140 07/10/22 0355 07/11/22 0358 07/12/22 0327 07/13/22 0317  WBC  --  9.5   < >  --   --  7.9 14.4* 13.6* 10.7*  LATICACIDVEN 3.1* 3.1*  --  1.6 1.3  --   --   --   --    < > = values in this interval not displayed.    Microbiology Recent Results (from the past 240 hour(s))  Blood culture (routine x 2)     Status: None   Collection Time: 07/05/22  1:20 PM   Specimen: BLOOD LEFT FOREARM  Result Value Ref Range Status   Specimen Description BLOOD LEFT FOREARM   Final   Special Requests   Final    BOTTLES DRAWN AEROBIC AND ANAEROBIC Blood Culture adequate volume   Culture   Final    NO GROWTH 5 DAYS Performed at [facility], [address], [place], [place] [address]    Report Status 07/10/2022 FINAL  Final  Blood culture (routine x 2)     Status: None   Collection Time: 07/05/22  1:25 PM   Specimen: BLOOD  Result Value Ref Range Status   Specimen Description BLOOD BLOOD RIGHT FOREARM  Final   Special Requests   Final    BOTTLES DRAWN AEROBIC AND ANAEROBIC Blood Culture results may not be optimal due to an inadequate volume of blood received in culture bottles   Culture   Final    NO GROWTH 5 DAYS Performed at [facility], [place] [address], [place], [place] [address]    Report Status 07/10/2022 FINAL  Final  Urine Culture     Status: None   Collection Time: 07/05/22  2:05 PM   Specimen: In/Out Cath Urine  Result Value Ref Range Status   Specimen Description IN/OUT CATH URINE  Final   Special Requests NONE  Final   Culture   Final    NO GROWTH Performed at [facility], [place] [address] [place], [place] [address]    Report Status 07/06/2022 FINAL  Final  MRSA Next Gen  by PCR, Nasal     Status: None   Collection Time: 07/05/22  7:45 PM   Specimen: Nasal Mucosa; Nasal Swab  Result Value Ref Range Status   MRSA by PCR Next Gen NOT DETECTED NOT DETECTED Final    Comment: (NOTE) The GeneXpert MRSA Assay (FDA approved for NASAL specimens only), is one component of a comprehensive MRSA colonization surveillance program. It is not intended to diagnose MRSA infection nor to guide or monitor treatment for MRSA infections. Test performance is not FDA approved in patients less than 13 years old. Performed at [facility], [place] [address], [place], [place] [address]   Urine Culture     Status: None   Collection Time: 07/06/22  4:40 PM   Specimen: In/Out Cath Urine  Result Value Ref Range Status   Specimen Description  IN/OUT CATH URINE  Final   Special Requests NONE  Final   Culture   Final    NO GROWTH Performed at [facility], [place] [address], [place], [place] [address]    Report Status 07/07/2022 FINAL  Final     Medications:    acetaminophen  1,000 mg Oral Q8H   aspirin EC  81 mg Oral Daily   buPROPion  75 mg Oral Daily   Chlorhexidine Gluconate Cloth  6 each Topical Daily   DULoxetine  60 mg Oral Daily   feeding supplement  237 mL Oral TID BM   furosemide  20 mg Intravenous Daily   heparin  5,000 Units Subcutaneous Q8H   insulin aspart  0-15 Units Subcutaneous Q4H   levothyroxine  75 mcg Per Tube Q0600   metoprolol tartrate  12.5 mg Oral BID   metroNIDAZOLE  500 mg Oral Q12H   mouth rinse  15 mL Mouth Rinse 4 times per day   pantoprazole  40 mg Oral QHS   psyllium  1 packet Oral Daily   sodium chloride flush  10-40 mL Intracatheter Q12H   Continuous Infusions:  sodium chloride 250 mL (07/11/22 0054)   cefTRIAXone (ROCEPHIN)  IV 2 g (07/13/22 1306)      LOS: 9 days   Charlynne Cousins  [facility]  07/14/2022, 9:00 AM

## 2022-07-15 ENCOUNTER — Other Ambulatory Visit: Payer: Self-pay

## 2022-07-15 ENCOUNTER — Other Ambulatory Visit: Payer: Medicare Other

## 2022-07-15 DIAGNOSIS — R42 Dizziness and giddiness: Secondary | ICD-10-CM

## 2022-07-15 DIAGNOSIS — R9431 Abnormal electrocardiogram [ECG] [EKG]: Secondary | ICD-10-CM

## 2022-07-15 DIAGNOSIS — I441 Atrioventricular block, second degree: Secondary | ICD-10-CM

## 2022-07-15 LAB — GLUCOSE, CAPILLARY
Glucose-Capillary: 77 mg/dL (ref 70–99)
Glucose-Capillary: 142 mg/dL — ABNORMAL HIGH (ref 70–99)
Glucose-Capillary: 103 mg/dL — ABNORMAL HIGH (ref 70–99)
Glucose-Capillary: 155 mg/dL — ABNORMAL HIGH (ref 70–99)
Glucose-Capillary: 33 mg/dL — CL (ref 70–99)
Glucose-Capillary: 32 mg/dL — CL (ref 70–99)
Glucose-Capillary: 98 mg/dL (ref 70–99)
Glucose-Capillary: 111 mg/dL — ABNORMAL HIGH (ref 70–99)

## 2022-07-15 LAB — PREPARE RBC (CROSSMATCH)

## 2022-07-15 LAB — CBC
HCT: 22 % — ABNORMAL LOW (ref 36.0–46.0)
Hemoglobin: 7.2 g/dL — ABNORMAL LOW (ref 12.0–15.0)
MCH: 31 pg (ref 26.0–34.0)
MCHC: 32.7 g/dL (ref 30.0–36.0)
MCV: 94.8 fL (ref 80.0–100.0)
Platelets: 373 10*3/uL (ref 150–400)
RBC: 2.32 MIL/uL — ABNORMAL LOW (ref 3.87–5.11)
RDW: 15.6 % — ABNORMAL HIGH (ref 11.5–15.5)
WBC: 7.6 10*3/uL (ref 4.0–10.5)
nRBC: 0.5 % — ABNORMAL HIGH (ref 0.0–0.2)

## 2022-07-15 MED ORDER — DEXTROSE 50 % IV SOLN: 25.0000 g | INTRAVENOUS | Status: DC

## 2022-07-15 MED ORDER — LIDOCAINE VISCOUS HCL 2 % MT SOLN
15.0000 mL | OROMUCOSAL | Status: DC | PRN
  Administered 2022-07-15 – 2022-07-16 (×3): 15 mL via OROMUCOSAL
  Filled 2022-07-15 (×4): qty 15

## 2022-07-15 MED ORDER — DEXTROSE 50 % IV SOLN
1.0000 | Freq: Once | INTRAVENOUS | Status: AC
  Administered 2022-07-15: 50 mL via INTRAVENOUS
  Filled 2022-07-15: qty 50

## 2022-07-15 MED ORDER — FUROSEMIDE 10 MG/ML IJ SOLN
20.0000 mg | Freq: Once | INTRAMUSCULAR | Status: AC
  Administered 2022-07-15: 20 mg via INTRAVENOUS
  Filled 2022-07-15: qty 2

## 2022-07-15 MED ORDER — SODIUM CHLORIDE 0.9% IV SOLUTION: Freq: Once | INTRAVENOUS | Status: AC

## 2022-07-15 MED ORDER — LIDOCAINE VISCOUS HCL 2 % MT SOLN: 15.0000 mL | OROMUCOSAL | Status: DC | PRN

## 2022-07-15 MED ORDER — AMOXICILLIN-POT CLAVULANATE 875-125 MG PO TABS
1.0000 | ORAL_TABLET | Freq: Two times a day (BID) | ORAL | Status: DC
  Administered 2022-07-15 – 2022-07-16 (×3): 1 via ORAL
  Filled 2022-07-15 (×3): qty 1

## 2022-07-15 MED ORDER — PSYLLIUM 95 % PO PACK
1.0000 | PACK | Freq: Two times a day (BID) | ORAL | Status: DC
  Administered 2022-07-15 – 2022-07-16 (×2): 1 via ORAL
  Filled 2022-07-15 (×4): qty 1

## 2022-07-15 MED ORDER — DEXTROSE 50 % IV SOLN
25.0000 g | INTRAVENOUS | Status: DC
  Filled 2022-07-15: qty 50

## 2022-07-15 NOTE — Progress Notes (Signed)
PT Cancellation Note  Patient Details Name: [name] MRN: [id number] DOB: 07/22/1941   Cancelled Treatment:    Reason Eval/Treat Not Completed: Other (comment). Upon PT arrival pt very anxious regarding having a very painful yeast infection "that needs to be looked at right now". PT called RN, RN reports she received [place] at 10am this morning. Informed pt of this and she was relieved however continued to defer PT as "I have too many things going on right now. I'm about to get blood." Pt educated on the importance of mobility and it affecting insurance authorization to go to SNF as they are requesting updated notes. Pt asked to talked to her children about it first. Pt also given OB wipes per her request, RN approved it. Sent message to case management. Acute PT to return as able to progress mobility.  Kittie Plater, PT, DPT Acute Rehabilitation Services Secure chat preferred Office #: [phone]    Berline Lopes 07/15/2022, 12:22 PM

## 2022-07-15 NOTE — TOC Progression Note (Addendum)
Transition of Care [facility]) - Progression Note    Patient Details  Name: [name] MRN: [id number] Date of Birth: 09-Mar-1941  [facility] CM/SW Contact  Joanne Chars, LCSW Phone Number: 07/15/2022, 11:15 AM  Clinical Narrative:   CSW spoke with PT office regarding need for PT note today for insurance auth, they will add pt to be seen today.   1330: PT attempted to work with pt and pt declined.  CSW attempted to speak with pt but she was on the phone, CSW spoke with RN who will talk with pt about need for PT note for insurance auth and DC.       Expected Discharge Plan and Services                                                 Social Determinants of Health (SDOH) Interventions    Readmission Risk Interventions     No data to display

## 2022-07-15 NOTE — Plan of Care (Signed)

## 2022-07-15 NOTE — Progress Notes (Signed)
Dtg arrived and at bedside, pt remains with flat affect and does not respond to verbal commands, however will respond to tactile stimulations. Will continue to monitor. SRP, RN

## 2022-07-15 NOTE — Progress Notes (Signed)
Pt noted with sudden change in mental status,   alert and oriented x 2-3 earlier in shift, pt had BM ( 4th one of the shift) Probation officer changed pt and after completion of care, NT comes in to take VSs pt repositioned and pt suddenly states, " I am done with life and done living" positioned body in supine and head drooped to right side HR dropped to 39-41 on diamap, did capture on HR monitor, immediately writer gave her sternal rub a few times to get pt to open her eyes. Eyes rolled back in head then pt open eyes few seconds, closed and Pupil reactive and Purposeful movement noted in response to sternal and clavicle stimulation by RR- RN Shanon Brow. Pt pushed hand away, then return to deep obtunded/flat state refusing to respond to tactile stimulation. VS stable, O2 96 % on 2.5 liters on at all times, CBG 77. Pt was talking earlier in shift based on previous noted from Probation officer. Pt dtg updated and in in route to hospital. NPOlena Heckle) on called updated with no additional orders; 12 lead EKG done with NSR. Response unexplained, pt stable and will cont to monitor. SRP, RN

## 2022-07-15 NOTE — Progress Notes (Addendum)
Pt restless throughout the night, Xanax given as ordered and much time reassuring pt and addressing questions and concerns. Pt also concerned about diarrhea episodes as pt have had total of three loose BM as of current time. Pt bottom red and tender, barrier cream applied. Will continue to monitor pt. Bed alarm set.   In addition, noticed pt coughing after thin liquids, specifically water, encourage pt to take small sips and to avoid the straw and tuck chin when swallowing to decrease coughing events.   SRP, RN, BSN-BC

## 2022-07-15 NOTE — Progress Notes (Signed)
Final round to check pt, pt awake and talking with dtg. Dtg states pt begin to talk and open eyes, request water and popsicle. Pt asked for pain med, Tylenol given as ordered. Asked to explain what happened, pt states she believes she was trying to "transition and she felt life leaving her body". Pt alert back to baseline at beginning of shift. Unexplained event. SRP,RN

## 2022-07-15 NOTE — Progress Notes (Signed)
[facility] PROGRESS NOTE    Progress Note  [name]  SAY:[id number] DOB: May 09, 1941 DOA: 07/05/2022 PCP: Reynold Bowen, MD     Brief Narrative:   [name] is an 81 y.o. female past medical history fibromyalgia, essential hypertension, anxiety and depression history of TIA and AV block Mobitz type I who comes in with shock status post lap chole for concerns of bowel perforation, which was unremarkable admitted to the ICU placed on Levophed has been weaned off and extubated was started empirically on Rocephin and Flagyl on 07/05/2022   Assessment/Plan:   Severe Septic shock ([place]) General surgery was consulted Status post exploratory laparotomy on 07/05/2022 due to concern of perforated bowel. Tolerating her diet. Transition to oral table buttock for total of 10 days. Follow-up with general surgery as an outpatient. Physical therapy evaluated the patient recommended skilled nursing facility. Hopefully she will be discharged tomorrow morning.  Acute respiratory failure with hypoxia: Intubated on admission now has been extubated . Out of bed to chair continue to wean to room air. She has been weaned to room air with diuresis.  Acute diastolic heart failure: Going back through the chart her weight is around 67 to 70 kg on 08/10/2022 she was around 80 kg. Her weight has stabilized she still positive about 12 L appears slightly fluid overloaded. Lasix was held that she was becoming hyponatremic creatinine is rising. Continue TED hose out of bed to chair. Monitor strict I's and O's and daily weights. Albumin is 1.6 which is probably contributing to her third spacing.  Normocytic anemia: No signs of overt bleeding, and has slowly been drifting down. Go ahead and transfuse 1 unit packed red blood cells recheck CBC posttransfusion, check a anemia panel pre-transfusion. Also check a CBC posttransfusion.  Infectious colitis: Continue oral Augmentin for total 10  days. Tolerating her diet we will follow-up with surgery as an outpatient. Diarrhea has improved with Metamucil.  Leukocytosis: Likely due to infectious colitis, Tmax 99.4 Leukocytosis continues to improve. Will continue to coverage Rocephin and Flagyl, she will need antibiotics for at least total of 14 days  Oral thrush: Continue Diflucan and lidocaine viscous gel. Her tongue thrush is resolved  Malnutrition of moderate degree Noted.  Ensure 3 times daily.  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 Advanced dementia: Resume Aricept and the nausea pill.      DVT prophylaxis: lovenox Family Communication:none Status is: Inpatient Remains inpatient appropriate because: Septic shock question ischemic colitis    Code Status:     Code Status Orders  (From admission, onward)           Start     Ordered   07/05/22 1424  Do not attempt resuscitation (DNR)  Continuous       Question Answer Comment  In the event of cardiac or respiratory ARREST Do not call a "code blue"   In the event of cardiac or respiratory ARREST Do not perform Intubation, CPR, defibrillation or ACLS   In the event of cardiac or respiratory ARREST Use medication by any route, position, wound care, and other measures to relive pain and suffering. May use oxygen, suction and manual treatment of airway obstruction as needed for comfort.   Comments Vasopressors ok short term      07/05/22 1425           Code Status History     Date Active Date Inactive Code Status Order ID Comments  User Context   04/30/2022 2127 05/01/2022 2012 DNR [id number]  Toy Baker, MD Inpatient   05/11/2020 2259 05/18/2020 1634 DNR [id number]  Lenore Cordia, MD ED   01/09/2012 2342 01/11/2012 1140 Full Code [id number]  Alanson Puls, RN Inpatient         IV Access:   Peripheral IV   Procedures and diagnostic studies:   No results  found.   Medical Consultants:   None.   Subjective:    [name] continues to have dysphagia  Objective:    Vitals:   07/14/22 0821 07/14/22 2032 07/14/22 2130 07/15/22 0439  BP: (!) 133/58 (!) 149/65 132/63 (!) 128/50  Pulse: (!) 105 (!) 104 (!) 104 92  Resp: 18 (!) 22 20   Temp: 99.1 F (37.3 C) (!) 100.7 F (38.2 C) 99.4 F (37.4 C) 99.4 F (37.4 C)  TempSrc:  Oral Oral Oral  SpO2: 96% 95% 96% 96%  Weight:    76 kg   SpO2: 96 % O2 Flow Rate (L/min): 3 L/min FiO2 (%): 40 %   Intake/Output Summary (Last 24 hours) at 07/15/2022 0958 Last data filed at 07/15/2022 0030 Gross per 24 hour  Intake 220 ml  Output --  Net 220 ml    Filed Weights   07/13/22 0437 07/14/22 0417 07/15/22 0439  Weight: 79.9 kg 75 kg 76 kg    Exam: General exam: In no acute distress. Respiratory system: Good air movement and clear to auscultation. Cardiovascular system: S1 & S2 heard, RRR. No JVD. Gastrointestinal system: Abdomen is nondistended, soft and nontender.  Extremities: No pedal edema. Skin: No rashes, lesions or ulcers Psychiatry: Judgement and insight appear normal. Mood & affect appropriate.  Data Reviewed:    Labs: Basic Metabolic Panel: Recent Labs  Lab 07/08/22 1748 07/09/22 0406 07/10/22 0355 07/10/22 1500 07/11/22 0358 07/12/22 0327 07/13/22 0317 07/14/22 0332  NA  --  136   < > 142 142 134* 136 134*  K  --  3.2*   < > 3.5 3.1* 3.4* 4.0 3.8  CL  --  100   < > 105 103 94* 97* 97*  CO2  --  26   < > '29 31 30 29 27  '$ GLUCOSE  --  130*   < > 174* 102* 140* 173* 124*  BUN  --  51*   < > '17 12 8 '$ 5* 6*  CREATININE  --  1.52*   < > 0.70 0.66 0.58 0.55 0.59  CALCIUM  --  7.1*   < > 7.4* 8.0* 7.7* 8.3* 8.8*  MG 3.1* 3.2*  --  1.9  --   --   --   --   PHOS 3.3 3.5  --   --   --   --   --   --    < > = values in this interval not displayed.    GFR Estimated Creatinine Clearance: 52.7 mL/min (by C-G formula based on SCr of 0.59 mg/dL). Liver Function  Tests: Recent Labs  Lab 07/09/22 0406 07/10/22 0355  AST 59* 64*  ALT 21 20  ALKPHOS 182* 158*  BILITOT <0.1* 0.4  PROT 4.1* 4.1*  ALBUMIN 1.8* 1.6*    No results for input(s): "LIPASE", "AMYLASE" in the last 168 hours.  No results for input(s): "AMMONIA" in the last 168 hours. Coagulation profile No results for input(s): "INR", "PROTIME" in the last 168 hours.  COVID-19 Labs  No results for input(s): "DDIMER", "FERRITIN", "LDH", "CRP"  in the last 72 hours.  Lab Results  Component Value Date   [place] NEGATIVE 05/11/2020    CBC: Recent Labs  Lab 07/11/22 0358 07/12/22 0327 07/13/22 0317 07/14/22 0333 07/15/22 0407  WBC 14.4* 13.6* 10.7* 10.4 7.6  NEUTROABS  --  11.7* 9.1*  --   --   HGB 9.2* 8.9* 7.6* 7.4* 7.2*  HCT 28.0* 27.4* 24.3* 23.7* 22.0*  MCV 95.2 94.8 96.4 98.8 94.8  PLT 188 240 282 366 373    Cardiac Enzymes: No results for input(s): "CKTOTAL", "CKMB", "CKMBINDEX", "TROPONINI" in the last 168 hours.  BNP (last 3 results) No results for input(s): "PROBNP" in the last 8760 hours. CBG: Recent Labs  Lab 07/14/22 1647 07/14/22 1817 07/14/22 2106 07/15/22 0445 07/15/22 0856  GLUCAP 167* 120* 121* 77 142*    D-Dimer: No results for input(s): "DDIMER" in the last 72 hours. Hgb A1c: No results for input(s): "HGBA1C" in the last 72 hours. Lipid Profile: No results for input(s): "CHOL", "HDL", "LDLCALC", "TRIG", "CHOLHDL", "LDLDIRECT" in the last 72 hours. Thyroid function studies: No results for input(s): "TSH", "T4TOTAL", "T3FREE", "THYROIDAB" in the last 72 hours.  Invalid input(s): "FREET3" Anemia work up: No results for input(s): "VITAMINB12", "FOLATE", "FERRITIN", "TIBC", "IRON", "RETICCTPCT" in the last 72 hours. Sepsis Labs: Recent Labs  Lab 07/09/22 0947 07/09/22 1140 07/10/22 0355 07/12/22 0327 07/13/22 0317 07/14/22 0333 07/15/22 0407  WBC  --   --    < > 13.6* 10.7* 10.4 7.6  LATICACIDVEN 1.6 1.3  --   --   --   --   --     < > = values in this interval not displayed.    Microbiology Recent Results (from the past 240 hour(s))  Blood culture (routine x 2)     Status: None   Collection Time: 07/05/22  1:20 PM   Specimen: BLOOD LEFT FOREARM  Result Value Ref Range Status   Specimen Description BLOOD LEFT FOREARM  Final   Special Requests   Final    BOTTLES DRAWN AEROBIC AND ANAEROBIC Blood Culture adequate volume   Culture   Final    NO GROWTH 5 DAYS Performed at [facility], [address] [place], [place] [address]    Report Status 07/10/2022 FINAL  Final  Blood culture (routine x 2)     Status: None   Collection Time: 07/05/22  1:25 PM   Specimen: BLOOD  Result Value Ref Range Status   Specimen Description BLOOD BLOOD RIGHT FOREARM  Final   Special Requests   Final    BOTTLES DRAWN AEROBIC AND ANAEROBIC Blood Culture results may not be optimal due to an inadequate volume of blood received in culture bottles   Culture   Final    NO GROWTH 5 DAYS Performed at [facility], [place] [address] [place], [place] [address]    Report Status 07/10/2022 FINAL  Final  Urine Culture     Status: None   Collection Time: 07/05/22  2:05 PM   Specimen: In/Out Cath Urine  Result Value Ref Range Status   Specimen Description IN/OUT CATH URINE  Final   Special Requests NONE  Final   Culture   Final    NO GROWTH Performed at [facility], [place] [address] [place], [place] [address]    Report Status 07/06/2022 FINAL  Final  MRSA Next Gen by PCR, Nasal     Status: None   Collection Time: 07/05/22  7:45 PM  Specimen: Nasal Mucosa; Nasal Swab  Result Value Ref Range Status   MRSA by PCR Next Gen NOT DETECTED NOT DETECTED Final    Comment: (NOTE) The GeneXpert MRSA Assay (FDA approved for NASAL specimens only), is one component of a comprehensive MRSA colonization surveillance program. It is not intended to diagnose MRSA infection nor to guide or monitor treatment for MRSA infections. Test  performance is not FDA approved in patients less than 78 years old. Performed at [facility], [place] [address] [place], [place] [address]   Urine Culture     Status: None   Collection Time: 07/06/22  4:40 PM   Specimen: In/Out Cath Urine  Result Value Ref Range Status   Specimen Description IN/OUT CATH URINE  Final   Special Requests NONE  Final   Culture   Final    NO GROWTH Performed at [facility], [place] [address] [place], [place] [address]    Report Status 07/07/2022 FINAL  Final     Medications:    acetaminophen  1,000 mg Oral Q8H   aspirin EC  81 mg Oral Daily   buPROPion  75 mg Oral Daily   Chlorhexidine Gluconate Cloth  6 each Topical Daily   DULoxetine  60 mg Oral Daily   feeding supplement  237 mL Oral TID BM   insulin aspart  0-15 Units Subcutaneous Q4H   levothyroxine  75 mcg Per Tube Q0600   metoprolol tartrate  12.5 mg Oral BID   mouth rinse  15 mL Mouth Rinse 4 times per day   pantoprazole  40 mg Oral QHS   psyllium  1 packet Oral Daily   sodium chloride flush  10-40 mL Intracatheter Q12H   Continuous Infusions:  sodium chloride 250 mL (07/11/22 0054)   fluconazole (DIFLUCAN) IV 100 mg (07/15/22 0912)      LOS: 10 days   Charlynne Cousins  [facility]  07/15/2022, 9:58 AM

## 2022-07-15 NOTE — Care Management Important Message (Signed)
Important Message  Patient Details  Name: [name] MRN: [id number] Date of Birth: 08-15-1940   Medicare Important Message Given:  Yes     Hannah Beat 07/15/2022, 1:10 PM

## 2022-07-15 NOTE — Progress Notes (Signed)
Long conversation with patient as to why [facility] is not scheduled will mention to day RN to update MD to review and explain to patient. SRP RN

## 2022-07-16 DIAGNOSIS — E440 Moderate protein-calorie malnutrition: Secondary | ICD-10-CM

## 2022-07-16 LAB — BASIC METABOLIC PANEL
Anion gap: 8 (ref 5–15)
BUN: <5 mg/dL — ABNORMAL LOW (ref 8–23)
CO2: 27 mmol/L (ref 22–32)
Calcium: 8.2 mg/dL — ABNORMAL LOW (ref 8.9–10.3)
Chloride: 99 mmol/L (ref 98–111)
Creatinine, Ser: 0.61 mg/dL (ref 0.44–1.00)
GFR, Estimated: >60 mL/min (ref >60)
Glucose, Bld: 110 mg/dL — ABNORMAL HIGH (ref 70–99)
Potassium: 3.3 mmol/L — ABNORMAL LOW (ref 3.5–5.1)
Sodium: 134 mmol/L — ABNORMAL LOW (ref 135–145)

## 2022-07-16 LAB — TYPE AND SCREEN
ABO/RH(D): O POS
Antibody Screen: NEGATIVE
Unit division: 0

## 2022-07-16 LAB — GLUCOSE, CAPILLARY
Glucose-Capillary: 109 mg/dL — ABNORMAL HIGH (ref 70–99)
Glucose-Capillary: 114 mg/dL — ABNORMAL HIGH (ref 70–99)
Glucose-Capillary: 140 mg/dL — ABNORMAL HIGH (ref 70–99)
Glucose-Capillary: 58 mg/dL — ABNORMAL LOW (ref 70–99)
Glucose-Capillary: 72 mg/dL (ref 70–99)

## 2022-07-16 LAB — BPAM RBC
Blood Product Expiration Date: 202312262359
ISSUE DATE / TIME: 202312041330
Unit Type and Rh: 5100

## 2022-07-16 LAB — HEMOGLOBIN AND HEMATOCRIT, BLOOD
HCT: 24.7 % — ABNORMAL LOW (ref 36.0–46.0)
Hemoglobin: 8.1 g/dL — ABNORMAL LOW (ref 12.0–15.0)

## 2022-07-16 MED ORDER — DEXTROSE-NACL 5-0.9 % IV SOLN: INTRAVENOUS | Status: DC

## 2022-07-16 MED ORDER — DIPHENOXYLATE-ATROPINE 2.5-0.025 MG PO TABS
1.0000 | ORAL_TABLET | Freq: Four times a day (QID) | ORAL | Status: DC | PRN
  Administered 2022-07-16: 1 via ORAL
  Filled 2022-07-16: qty 1

## 2022-07-16 MED ORDER — PSYLLIUM 95 % PO PACK: 1.0000 | PACK | Freq: Two times a day (BID) | ORAL | Status: DC

## 2022-07-16 MED ORDER — ZINC OXIDE 12.8 % EX OINT: TOPICAL_OINTMENT | CUTANEOUS | 0 refills | Status: DC | PRN

## 2022-07-16 MED ORDER — TRAMADOL HCL 50 MG PO TABS: 50.0000 mg | ORAL_TABLET | Freq: Four times a day (QID) | ORAL | 0 refills | Status: AC | PRN

## 2022-07-16 MED ORDER — POTASSIUM CHLORIDE 20 MEQ PO PACK
40.0000 meq | PACK | Freq: Three times a day (TID) | ORAL | Status: DC
  Administered 2022-07-16 (×2): 40 meq via ORAL
  Filled 2022-07-16 (×2): qty 2

## 2022-07-16 MED ORDER — ALPRAZOLAM 0.5 MG PO TABS: 0.5000 mg | ORAL_TABLET | Freq: Two times a day (BID) | ORAL | 1 refills | Status: DC | PRN

## 2022-07-16 MED ORDER — AMOXICILLIN-POT CLAVULANATE 875-125 MG PO TABS: 1.0000 | ORAL_TABLET | Freq: Two times a day (BID) | ORAL | 0 refills | Status: DC

## 2022-07-16 MED ORDER — AMOXICILLIN-POT CLAVULANATE 875-125 MG PO TABS: 1.0000 | ORAL_TABLET | Freq: Two times a day (BID) | ORAL | 0 refills | Status: AC

## 2022-07-16 MED ORDER — FLUCONAZOLE 100 MG PO TABS: 100.0000 mg | ORAL_TABLET | Freq: Every day | ORAL | 0 refills | Status: AC

## 2022-07-16 MED ORDER — DIPHENOXYLATE-ATROPINE 2.5-0.025 MG/5ML PO LIQD: 5.0000 mL | Freq: Four times a day (QID) | ORAL | 0 refills | Status: DC | PRN

## 2022-07-16 MED ORDER — ZINC OXIDE 12.8 % EX OINT
TOPICAL_OINTMENT | CUTANEOUS | Status: DC | PRN
  Filled 2022-07-16 (×2): qty 56.7

## 2022-07-16 MED ORDER — LISINOPRIL 20 MG PO TABS: 20.0000 mg | ORAL_TABLET | Freq: Every day | ORAL | 3 refills | Status: DC

## 2022-07-16 MED ORDER — DIPHENOXYLATE-ATROPINE 2.5-0.025 MG/5ML PO LIQD: 5.0000 mL | Freq: Four times a day (QID) | ORAL | Status: DC | PRN

## 2022-07-16 MED ORDER — LIDOCAINE VISCOUS HCL 2 % MT SOLN: 15.0000 mL | OROMUCOSAL | 0 refills | Status: DC | PRN

## 2022-07-16 NOTE — TOC Transition Note (Signed)
Transition of Care [facility]) - CM/SW Discharge Note   Patient Details  Name: Charmon Thorson MRN: [id number] Date of Birth: 10-Dec-1940  [facility] CM/SW Contact:  Coralee Pesa, [place] Phone Number: 07/16/2022, 1:12 PM   Clinical Narrative:     Pt to be transported to [facility] via [facility]. Nurse to call report to [phone]. Rm# [phone]  Final next level of care: Skilled Nursing Facility Barriers to Discharge: Barriers Resolved   Patient Goals and CMS Choice        Discharge Placement              Patient [facility] bed at: [facility] Patient to be transferred to facility by: [facility] Name of family member notified: [name] Patient and family notified of of transfer: 07/16/22  Discharge Plan and Services                                     Social Determinants of Health (SDOH) Interventions     Readmission Risk Interventions     No data to display

## 2022-07-16 NOTE — Progress Notes (Signed)
Occupational Therapy Treatment Patient Details Name: [name] MRN: [id number] DOB: [birth year] Today's Date: 07/16/2022   History of present illness 40 F admitted 11/24 with Abd pain and syncope after straining for BM. Hypotensive with lactic acidosis, found to have colitis, sepsis. 11/24 Exploratory laparotomy with normal findings. Intubated 11/24-11/28. Confusion/AMS on 12/4. PMHx: constipation, fibromyalgia, anxiety, insomnia, GERD, AV block, HTN   OT comments  Patient received in supine and is experiencing frequent loose stools, nursing stated they had recently changed patient and provided new purewick and was soiled again on our visit. Patient continues to require assistance of 2 for getting to EOB but is demonstrating improvement with sitting balance and following directions but does require frequent cueing on safety.  Patient is expected to discharge to SNF for continued OT to address functional transfers and self care.    Recommendations for follow up therapy are one component of a multi-disciplinary discharge planning process, led by the attending physician.  Recommendations may be updated based on patient status, additional functional criteria and insurance authorization.    Follow Up Recommendations  Skilled nursing-short term rehab (<3 hours/day)     Assistance Recommended at Discharge Frequent or constant Supervision/Assistance  Patient can return home with the following  A lot of help with walking and/or transfers;A lot of help with bathing/dressing/bathroom;Assistance with cooking/housework;Direct supervision/assist for medications management;Direct supervision/assist for financial management;Assist for transportation;Help with stairs or ramp for entrance   Equipment Recommendations  Other (comment) (defer)    Recommendations for Other Services      Precautions / Restrictions Precautions Precautions: Fall Precaution Comments: loose stools, AMS Restrictions Weight  Bearing Restrictions: No       Mobility Bed Mobility Overal bed mobility: Needs Assistance Bed Mobility: Sidelying to Sit, Rolling Rolling: Min assist Sidelying to sit: Mod assist, +2 for safety/equipment       General bed mobility comments: required instructions on using rails to assist with rolling and getting BLEs off EOB.  mod assist of 2 to raise trunk    Transfers Overall transfer level: Needs assistance Equipment used: Rolling walker (2 wheels) Transfers: Sit to/from Stand Sit to Stand: +2 physical assistance, Min assist     Step pivot transfers: Min assist, +2 safety/equipment     General transfer comment: frequint cues for hand placement and safety     Balance Overall balance assessment: Needs assistance Sitting-balance support: No upper extremity supported, Feet supported Sitting balance-Leahy Scale: Fair Sitting balance - Comments: once pt sitting at foot flat, fair static balance; poor dynamic seated balance Postural control: Posterior lean Standing balance support: Bilateral upper extremity supported, Reliant on assistive device for balance Standing balance-Leahy Scale: Poor Standing balance comment: minA +1-2 for static standing at RW, cues for posture; increased assist during peri-care due to increased posterior lean/trunk flexion.                           ADL either performed or assessed with clinical judgement   ADL Overall ADL's : Needs assistance/impaired             Lower Body Bathing: Moderate assistance;Maximal assistance;Sit to/from stand Lower Body Bathing Details (indicate cue type and reason): assistance with cleaning following loose stool when in bed and when standing     Lower Body Dressing: Maximal assistance;Sit to/from stand Lower Body Dressing Details (indicate cue type and reason): max assist to donn socks at bed level and mesh underwear seated/standing  General ADL Comments: patient required  assistance with cleaning following BM in bed and completed cleaning while standing from bedside. Mesh panties donned with max assist    Extremity/Trunk Assessment              Vision       Perception     Praxis      Cognition Arousal/Alertness: Awake/alert Behavior During Therapy: Anxious Overall Cognitive Status: Impaired/Different from baseline Area of Impairment: Orientation, Memory, Safety/judgement, Awareness, Problem solving, Following commands, Attention                   Current Attention Level: Sustained Memory: Decreased short-term memory Following Commands: Follows one step commands with increased time Safety/Judgement: Decreased awareness of safety, Decreased awareness of deficits Awareness: Intellectual Problem Solving: Slow processing, Requires verbal cues, Difficulty sequencing General Comments: perseverated on purewick and frequent urination as well as loose stools        Exercises      Shoulder Instructions       General Comments frequent loose stools    Pertinent Vitals/ Pain       Pain Assessment Pain Assessment: PAINAD Faces Pain Scale: Hurts little more Pain Location: peri area Pain Descriptors / Indicators: Grimacing, Moaning, Sore, Discomfort Pain Intervention(s): Monitored during session, Repositioned  Home Living                                          Prior Functioning/Environment              Frequency  Min 2X/week        Progress Toward Goals  OT Goals(current goals can now be found in the care plan section)  Progress towards OT goals: Progressing toward goals  Acute Rehab OT Goals Patient Stated Goal: get better OT Goal Formulation: With patient Time For Goal Achievement: 07/24/22 Potential to Achieve Goals: Good ADL Goals Pt Will Perform Grooming: with supervision;sitting;standing Pt Will Perform Lower Body Dressing: sit to/from stand;with adaptive equipment;with supervision Pt Will  Transfer to Toilet: with supervision;ambulating;bedside commode Additional ADL Goal #1: Pt will complete bed mobility and maintain sitting balance at EOB for 5 minutes with supervision as precursor to ADLs. Additional ADL Goal #2: Pt will attend to and follow 3 step ADL task with supervision.  Plan Discharge plan remains appropriate    Co-evaluation    PT/OT/SLP Co-Evaluation/Treatment: Yes Reason for Co-Treatment: Necessary to address cognition/behavior during functional activity;For patient/therapist safety;To address functional/ADL transfers PT goals addressed during session: Mobility/safety with mobility;Balance;Proper use of DME;Strengthening/ROM OT goals addressed during session: Proper use of Adaptive equipment and DME      AM-PAC OT "6 Clicks" Daily Activity     Outcome Measure   Help from another person eating meals?: A Little Help from another person taking care of personal grooming?: A Little Help from another person toileting, which includes using toliet, bedpan, or urinal?: A Lot Help from another person bathing (including washing, rinsing, drying)?: A Lot Help from another person to put on and taking off regular upper body clothing?: A Little Help from another person to put on and taking off regular lower body clothing?: A Lot 6 Click Score: 15    End of Session Equipment Utilized During Treatment: Gait belt;Rolling walker (2 wheels)  OT Visit Diagnosis: Other abnormalities of gait and mobility (R26.89);Muscle weakness (generalized) (M62.81);Pain;Other symptoms and signs involving cognitive function  Activity Tolerance Patient tolerated treatment well   Patient Left in chair;with call bell/phone within reach;with chair alarm set   Nurse Communication Mobility status        Time: 4619-0122 OT Time Calculation (min): 38 min  Charges: OT General Charges $OT Visit: 1 Visit OT Treatments $Self Care/Home Management : 8-22 mins  Lodema Hong, [place]  Office [phone]   Trixie Dredge 07/16/2022, 2:18 PM

## 2022-07-16 NOTE — Progress Notes (Signed)
       Overnight   NAME: [name] MRN: [id number] DOB : 1941-03-21    Date of Service   07/16/2022   HPI/Events of Note   Notified by RN for continued drops in CBG in spite of use of hypoglycemic protocol (D50 )    Interventions/ Plan   IV Fluid containing Dextrose is ordered.  2. CBG rechecks ongoing      Gershon Cull BSN MSNA MSN [place]

## 2022-07-16 NOTE — Progress Notes (Signed)
Removed staples on midline incision per policy/per orders.  Pt tolerated well.  Pt to discharge now with PTAR. Going to [facility].

## 2022-07-16 NOTE — Progress Notes (Signed)
Physical Therapy Treatment Patient Details Name: [name] MRN: [id number] DOB: 07-08-1941 Today's Date: 07/16/2022   History of Present Illness 6 F admitted 11/24 with Abd pain and syncope after straining for BM. Hypotensive with lactic acidosis, found to have colitis, sepsis. 11/24 Exploratory laparotomy with normal findings. Intubated 11/24-11/28. Confusion/AMS on 12/4. PMHx: constipation, fibromyalgia, anxiety, insomnia, GERD, AV block, HTN    PT Comments    Pt received in supine, c/o discomfort in peri area and RN leaving room after cleaning pt up/giving bed bath. Pt agreeable to therapy session and with good participation and tolerance for transfers and short gait trial at bedside. Pt limited in standing tolerance due to fatigue/weakness however she is able to perform gait trial ~38f with minA +2 but required chair pulled up behind her when fatigued. BP stable seated post-exertion, SpO2 not reading and cool/blue fingertips, RN notified. Pt with secretion-laden cough and instructed on use of suction and deep breaths, pt may benefit also from incentive spirometer. Pt with noted memory deficits and needing increased time/instruction to perform all tasks but with improved activity tolerance this session compared with previously. Pt up in recliner with chair alarm on for safety and daughter present at end of session. Pt continues to benefit from PT services to progress toward functional mobility goals.   quRecommendations for follow up therapy are one component of a multi-disciplinary discharge planning process, led by the attending physician.  Recommendations may be updated based on patient status, additional functional criteria and insurance authorization.  Follow Up Recommendations  Sk[facility] (<3 hours/day[facility] Can patient physically be transported by private vehicle: No (quick to fatigue)   Assistance Recommended at Discharge Frequent or  constant Supervision/Assistance  Patient can return home with the following A lot of help with bathing/dressing/bathroom;Assistance with cooking/housework;Direct supervision/assist for medications management;Direct supervision/assist for financial management;Assist for transportation;Help with stairs or ramp for entrance;Two people to help with walking and/or transfers   Equipment Recommendations  Rolling walker (2 wheels);Wheelchair (measurements PT)    Recommendations for Other Services       Precautions / Restrictions Precautions Precautions: Fall Precaution Comments: loose stools, AMS Restrictions Weight Bearing Restrictions: No     Mobility  Bed Mobility Overal bed mobility: Needs Assistance Bed Mobility: Sidelying to Sit, Rolling Rolling: Min assist Sidelying to sit: Mod assist, +2 for safety/equipment       General bed mobility comments: instructions on getting BLEs off EOB and rail use with minA to advance hips and to roll and modA +2 for trunk raising and posterior seated scooting.    Transfers Overall transfer level: Needs assistance Equipment used: Rolling walker (2 wheels) Transfers: Sit to/from Stand Sit to Stand: +2 physical assistance, Min assist   Step pivot transfers: Min assist, +2 safety/equipment       General transfer comment: patient able to stand with +1 modA to +2 minA, x3 stands at bedside; poor carryover of safe UE placement needing reminder each attempt.    Ambulation/Gait Ambulation/Gait assistance: Min assist, +2 safety/equipment Gait Distance (Feet): 8 Feet Assistive device: Rolling walker (2 wheels) Gait Pattern/deviations: Decreased stride length, Wide base of support, Decreased dorsiflexion - right, Decreased dorsiflexion - left, Shuffle Gait velocity: decreased     General Gait Details: Chair follow for safety, pt quick to fatigue; minA for stability and RW assist, mod cues for step sequencing and safety; DOE 1/4, pt SpO2 not reading  per pulse ox, RN notified (fingertips blue). Pt denies  dizziness during ambulation but does c/o fatigue/weakness and needing to sit with chair pulled up to her. BP stable seated in chair post-gait.    Balance Overall balance assessment: Needs assistance Sitting-balance support: No upper extremity supported, Feet supported Sitting balance-Leahy Scale: Fair Sitting balance - Comments: once pt sitting at foot flat, fair static balance; poor dynamic seated balance Postural control: Posterior lean Standing balance support: Bilateral upper extremity supported, Reliant on assistive device for balance Standing balance-Leahy Scale: Poor Standing balance comment: minA +1-2 for static standing at RW, cues for posture; increased assist during peri-care due to increased posterior lean/trunk flexion.                            Cognition Arousal/Alertness: Awake/alert Behavior During Therapy: Anxious Overall Cognitive Status: Impaired/Different from baseline Area of Impairment: Orientation, Memory, Safety/judgement, Awareness, Problem solving, Following commands, Attention                   Current Attention Level: Sustained Memory: Decreased short-term memory Following Commands: Follows one step commands with increased time Safety/Judgement: Decreased awareness of safety, Decreased awareness of deficits Awareness: Intellectual Problem Solving: Slow processing, Requires verbal cues, Difficulty sequencing General Comments: followed directions with increased time. Pt perseverating on "no purewick" overnight but when PTA and OT arrived to room, pt had just been changed with new purewick placed (within past 10 mins) and new purewick was already soiled. Pt and daughter notified that with so many loose stools, it's possible the purewick was making her soreness/discomfort worse, at times it is better to have flexiseal placed vs no purewick and more frequent skin checks. Pt daughter receptive,  and was asking if MD would check her for Cdiff, MD/RN notified of pt daughter's question.        Exercises Other Exercises Other Exercises: ankle pumps x10 reps ea, heel cord stretch x30 sec each x1    General Comments General comments (skin integrity, edema, etc.): Frequent loose stools, MD notified pt family asking about Cdiff test (MD says no). BP 133/55 (78) reclined in chair at end of session; SpO2 not reading despite warm blanket and fingertips blue, RN notified.      Pertinent Vitals/Pain Pain Assessment Pain Assessment: PAINAD Breathing: occasional labored breathing, short period of hyperventilation Negative Vocalization: occasional moan/groan, low speech, negative/disapproving quality Facial Expression: smiling or inexpressive Body Language: relaxed Consolability: distracted or reassured by voice/touch PAINAD Score: 3 Pain Location: peri area Pain Descriptors / Indicators: Grimacing, Moaning, Sore, Discomfort ("raw") Pain Intervention(s): Monitored during session, Repositioned, Other (comment) (pt assisted with peri care and placement of pad for absorbency due to pt frequent loose stools)           PT Goals (current goals can now be found in the care plan section) Acute Rehab PT Goals Patient Stated Goal: to return to prior level of mobility PT Goal Formulation: With patient/family Time For Goal Achievement: 07/24/22 Progress towards PT goals: Progressing toward goals    Frequency    Min 3X/week      PT Plan Current plan remains appropriate    Co-evaluation PT/OT/SLP Co-Evaluation/Treatment: Yes Reason for Co-Treatment: Necessary to address cognition/behavior during functional activity;For patient/therapist safety;To address functional/ADL transfers PT goals addressed during session: Mobility/safety with mobility;Balance;Proper use of DME;Strengthening/ROM        AM-PAC PT "6 Clicks" Mobility   Outcome Measure  Help needed turning from your back to  your side while in a flat bed  without using bedrails?: A Lot Help needed moving from lying on your back to sitting on the side of a flat bed without using bedrails?: A Lot Help needed moving to and from a bed to a chair (including a wheelchair)?: Total Help needed standing up from a chair using your arms (e.g., wheelchair or bedside chair)?: Total Help needed to walk in hospital room?: Total Help needed climbing 3-5 steps with a railing? : Total 6 Click Score: 8    End of Session Equipment Utilized During Treatment: Gait belt Activity Tolerance: Patient tolerated treatment well;Patient limited by fatigue Patient left: in chair;with call bell/phone within reach;with chair alarm set;with family/visitor present;Other (comment) (daughter Jeani Hawking (middle name) in room) Nurse Communication: Mobility status;Precautions;Other (comment) (daughter present, has questions about loose stools; MD also notified. Memory deficits; lots of secretions) PT Visit Diagnosis: Other abnormalities of gait and mobility (R26.89);Muscle weakness (generalized) (M62.81)     Time: 8101-7510 PT Time Calculation (min) (ACUTE ONLY): 46 min  Charges:  $Gait Training: 8-22 mins $Therapeutic Activity: 8-22 mins                     Sergei Delo P., PTA Acute Rehabilitation Services Secure Chat Preferred 9a-5:30pm Office: [place] 07/16/2022, 12:16 PM

## 2022-07-16 NOTE — Progress Notes (Signed)
Hypoglycemic Event  CBG: 33  Treatment: 8 oz juice/soda, D50 25 mg  Symptoms: Nervous/irritable  Follow-up CBG: Time: 2045 CBG Result: 98  Possible Reasons for Event: Inadequate meal intake  Comments/MD notified: Clarene Essex, NP notified and hypoglycemia protocol implemented. Pt alert and able to take on PO but refuses to drink more after the initial 8 oz of juice given. Hospitalist made aware and D50 '25mg'$  ordered and administered to pt. Will continue to monitor pt.   [place]

## 2022-07-16 NOTE — Discharge Summary (Addendum)
Physician Discharge Summary  [name] DTO:[id number] DOB: 1941-07-04 DOA: 07/05/2022  PCP: Reynold Bowen, MD  Admit date: 07/05/2022 Discharge date: 07/16/2022  Admitted From: Home Disposition:  SNF  Recommendations for Outpatient Follow-up:  Follow up with PCP in 1-2 weeks, will need to have a CBC check at skilled nursing facility. Please obtain BMP/CBC in one week   Home Health:No Equipment/Devices:None  Discharge Condition:Stable CODE STATUS:Full Diet recommendation: Heart Healthy   Brief/Interim Summary: 81 y.o. female past medical history fibromyalgia, essential hypertension, anxiety and depression history of TIA and AV block Mobitz type I who comes in with shock status post lap chole for concerns of bowel perforation, which was unremarkable admitted to the ICU placed on Levophed has been weaned off and extubated was started empirically on Rocephin and Flagyl on 07/05/2022   Discharge Diagnoses:  Principal Problem:   Septic shock ([place]) Active Problems:   Hypovolemia   Malnutrition of moderate degree  Severe septic shock probably die infectious colitis: CT of the chest abdomen and pelvis show large amount of stool some wall thickening the sigmoid colon suggestive of infectious or inflammatory colitis. Patient decompensated become hypotensive admitted to PCCM intubated and started on pressors General surgery was consulted as there was a concern of bowel perforation she status post exploratory laparotomy in 24 2023. She was extubated off pressors was able to tolerate her diet. Physical therapy evaluated the patient she will need to go home to skilled nursing facility.  Acute respiratory failure with hypoxia: Intubated on admission then extubated subsequently we were able to wean her to room air.  Acute diastolic heart failure: Going back through her chart she weighs around 67-70 kg at her highest weight she was 80 kg is probably contributing to her acute respiratory  failure she was diuresed she diuresed about 6 L she still about 10 L positive but her albumin is 1.2. Now that she is off oxygen and she will show out of diuresis and outpatient, would recommend getting a basic metabolic panel as an outpatient.  Normocytic anemia: No signs of overt bleeding, she has been slowly drifting down her heparin was discontinued. She was transfused 1 unit of packed red blood cells her CBC posttransfusion was 7.8. Will need to check CBC as an outpatient.  Infection colitis: She was started empirically on Rocephin and azithromycins on admission she was having a lot of diarrhea she was already on Metamucil and antibiotics infection improved. She will continue Augmentin for an additional 3 days postdischarge.  Oral thrush: Started on Diflucan and lidocaine viscous gel she continue that as an outpatient.  Moderate protein caloric malnutrition: Continue Ensure 3 times daily.  History of AV Mobitz type I block: She will need to follow-up with cardiology as an outpatient continue Aricept and metoprolol.  No change made to her medication.  Discharge Instructions  Discharge Instructions     Diet - low sodium heart healthy   Complete by: As directed    Increase activity slowly   Complete by: As directed    No wound care   Complete by: As directed       Allergies as of 07/16/2022       Reactions   Morphine And Related Itching, Rash   Given in IV.        Medication List     STOP taking these medications    amitriptyline 25 MG tablet Commonly known as: ELAVIL   Collagen Hydrolysate (Bovine) Powd   meloxicam 7.5 MG tablet Commonly  known as: MOBIC       TAKE these medications    ALPRAZolam 0.5 MG tablet Commonly known as: XANAX Take 1 tablet (0.5 mg total) by mouth 2 (two) times daily as needed. for anxiety What changed:  how much to take when to take this reasons to take this additional instructions   amoxicillin-clavulanate 875-125 MG  tablet Commonly known as: AUGMENTIN Take 1 tablet by mouth every 12 (twelve) hours for 1 day.   aspirin EC 81 MG tablet Take 1 tablet (81 mg total) by mouth daily. Swallow whole. What changed: additional instructions   buPROPion 75 MG tablet Commonly known as: WELLBUTRIN Take 75 mg by mouth daily.   calcium-vitamin D 500-200 MG-UNIT tablet Commonly known as: OSCAL WITH D Take 1 tablet by mouth daily.   Citrucel oral powder Generic drug: methylcellulose 2 tablespoons daily What changed:  how much to take how to take this when to take this additional instructions   diphenoxylate-atropine 2.5-0.025 MG/5ML liquid Commonly known as: LOMOTIL Take 5 mLs by mouth 4 (four) times daily as needed for diarrhea or loose stools.   DRY EYES OP Apply 1 drop to eye 3 (three) times daily as needed (for dry eyes).   DULoxetine 60 MG capsule Commonly known as: CYMBALTA Take 60 mg by mouth daily.   fluconazole 100 MG tablet Commonly known as: Diflucan Take 1 tablet (100 mg total) by mouth daily for 5 days.   levothyroxine 75 MCG tablet Commonly known as: SYNTHROID Take 75 mcg by mouth daily before breakfast.   lidocaine 2 % solution Commonly known as: XYLOCAINE Use as directed 15 mLs in the mouth or throat every 3 (three) hours as needed for mouth pain.   lisinopril 20 MG tablet Commonly known as: ZESTRIL Take 1 tablet (20 mg total) by mouth daily. Start taking on: July 30, 2022 What changed: These instructions start on July 30, 2022. If you are unsure what to do until then, ask your doctor or other care provider.   metoprolol succinate 50 MG 24 hr tablet Commonly known as: TOPROL-XL Take 1 tablet (50 mg total) by mouth daily. Take with or immediately following a meal. NEED ANNUAL VISIT FOR FURTHER REFILLS   multivitamin with minerals tablet Take 1 tablet by mouth daily.   omeprazole 40 MG capsule Commonly known as: PRILOSEC Take 1 capsule (40 mg total) by mouth  daily. NEED ANNUAL VISIT FOR FURTHER REFILLS What changed: additional instructions   psyllium 95 % Pack Commonly known as: HYDROCIL/METAMUCIL Take 1 packet by mouth 2 (two) times daily.   rosuvastatin 10 MG tablet Commonly known as: Crestor Take 1 tablet (10 mg total) by mouth daily.   SENNA LAX PO Take 1 tablet by mouth daily.   traMADol 50 MG tablet Commonly known as: Ultram Take 1 tablet (50 mg total) by mouth every 6 (six) hours as needed for up to 3 days. What changed: See the new instructions.   Zinc Oxide 12.8 % ointment Commonly known as: TRIPLE PASTE Apply topically as needed for irritation.        Contact information for follow-up providers     Ileana Roup, MD Follow up on 08/07/2022.   Specialties: General Surgery, Colon and Rectal Surgery Why: 9:10am, Arrive 30 minutes prior to your appointment time, Please bring your insurance card and photo ID Contact information: [place] [id number] [phone]              Contact  information for after-discharge care     Destination     [facility] Preferred SNF .   Service: Skilled Nursing Contact information: [place] [address] [phone]                    Allergies  Allergen Reactions   Morphine And Related Itching and Rash    Given in IV.    Consultations: PCCM General surgery.   Procedures/Studies: ECHOCARDIOGRAM COMPLETE  Result Date: 07/11/2022    ECHOCARDIOGRAM REPORT   Patient Name:   [name] Date of Exam: 07/06/2022 Medical Rec #:  [id number]       Height:       62.0 in Accession #:    [id number]      Weight:       172.4 lb Date of Birth:  1941-07-27       BSA:          1.795 m Patient Age:    12 years        BP:           110/73 mmHg Patient Gender: F               HR:           97 bpm. Exam Location:  Inpatient Procedure: 2D Echo, Cardiac Doppler, Color Doppler and  Intracardiac            Opacification Agent Indications:     Elevated troponin  History:         Patient has prior history of Echocardiogram examinations, most                  recent 05/01/2022. Risk Factors:Hypertension. Septic shock.  Sonographer:     Clayton Lefort RDCS (AE) Referring Phys:  Brand Males Diagnosing Phys: Collene Mares Custovic  Sonographer Comments: Suboptimal parasternal window and echo performed with patient supine and on artificial respirator. Image acquisition challenging due to respiratory motion. IMPRESSIONS  1. Left ventricular ejection fraction, by estimation, is 50%. Left ventricular ejection fraction by 2D MOD biplane is 45.8 %. The left ventricle has low normal function. The left ventricle has no regional wall motion abnormalities. Left ventricular diastolic parameters are consistent with Grade I diastolic dysfunction (impaired relaxation).  2. Right ventricular systolic function is normal. The right ventricular size is normal.  3. The mitral valve is grossly normal. Trivial mitral valve regurgitation.  4. The aortic valve is calcified. Aortic valve regurgitation is not visualized. Aortic valve sclerosis is present, with no evidence of aortic valve stenosis. FINDINGS  Left Ventricle: Left ventricular ejection fraction, by estimation, is 50%. Left ventricular ejection fraction by 2D MOD biplane is 45.8 %. The left ventricle has low normal function. The left ventricle has no regional wall motion abnormalities. Definity  contrast agent was given IV to delineate the left ventricular endocardial borders. The left ventricular internal cavity size was normal in size. There is no left ventricular hypertrophy. Left ventricular diastolic parameters are consistent with Grade I diastolic dysfunction (impaired relaxation). Right Ventricle: The right ventricular size is normal. No increase in right ventricular wall thickness. Right ventricular systolic function is normal. Left Atrium: Left atrial size was  normal in size. Right Atrium: Right atrial size was normal in size. Pericardium: There is no evidence of pericardial effusion. Mitral Valve: The mitral valve is grossly normal. There is mild thickening of the mitral valve leaflet(s). Trivial mitral valve regurgitation. Tricuspid Valve: The  tricuspid valve is grossly normal. Tricuspid valve regurgitation is not demonstrated. Aortic Valve: The aortic valve is calcified. Aortic valve regurgitation is not visualized. Aortic valve sclerosis is present, with no evidence of aortic valve stenosis. Aortic valve mean gradient measures 4.0 mmHg. Aortic valve peak gradient measures 6.7  mmHg. Aortic valve area, by VTI measures 1.72 cm. Pulmonic Valve: The pulmonic valve was grossly normal. Pulmonic valve regurgitation is not visualized. Aorta: The aortic root is normal in size and structure. IAS/Shunts: No atrial level shunt detected by color flow Doppler.  LEFT VENTRICLE PLAX 2D                        Biplane EF (MOD) LVIDd:         4.50 cm         LV Biplane EF:   Left LVIDs:         3.50 cm                          ventricular LV PW:         1.40 cm                          ejection LV IVS:        1.30 cm                          fraction by LVOT diam:     1.80 cm                          2D MOD LV SV:         32                               biplane is LV SV Index:   18                               45.8 %. LVOT Area:     2.54 cm                                Diastology                                LV e' medial:    5.44 cm/s LV Volumes (MOD)               LV E/e' medial:  7.8 LV vol d, MOD    58.3 ml       LV e' lateral:   7.07 cm/s A2C:                           LV E/e' lateral: 6.0 LV vol d, MOD    85.1 ml A4C: LV vol s, MOD    33.0 ml A2C: LV vol s, MOD    37.9 ml A4C: LV SV MOD A2C:   25.3 ml LV SV MOD A4C:   85.1 ml LV SV MOD BP:    33.4 ml RIGHT VENTRICLE  IVC RV Basal diam:  3.00 cm     IVC diam: 1.20 cm RV S prime:     14.80 cm/s TAPSE (M-mode): 2.0  cm LEFT ATRIUM             Index        RIGHT ATRIUM          Index LA diam:        3.90 cm 2.17 cm/m   RA Area:     9.11 cm LA Vol (A2C):   30.1 ml 16.77 ml/m  RA Volume:   16.90 ml 9.42 ml/m LA Vol (A4C):   39.5 ml 22.01 ml/m LA Biplane Vol: 37.5 ml 20.89 ml/m  AORTIC VALVE AV Area (Vmax):    1.96 cm AV Area (Vmean):   1.75 cm AV Area (VTI):     1.72 cm AV Vmax:           129.00 cm/s AV Vmean:          87.300 cm/s AV VTI:            0.186 m AV Peak Grad:      6.7 mmHg AV Mean Grad:      4.0 mmHg LVOT Vmax:         99.50 cm/s LVOT Vmean:        59.900 cm/s LVOT VTI:          0.126 m LVOT/AV VTI ratio: 0.68  AORTA Ao Root diam: 2.80 cm MITRAL VALVE MV Area (PHT): 2.83 cm    SHUNTS MV Decel Time: 268 msec    Systemic VTI:  0.13 m MV E velocity: 42.70 cm/s  Systemic Diam: 1.80 cm MV A velocity: 59.40 cm/s MV E/A ratio:  0.72 Public relations account executive signed by Floydene Flock Signature Date/Time: 07/11/2022/12:55:03 PM    Final    VAS [place] LOWER EXTREMITY VENOUS (DVT)  Result Date: 07/07/2022  Lower Venous DVT Study Patient Name:  [name]  Date of Exam:   07/07/2022 Medical Rec #: [id number]        Accession #:    [id number] Date of Birth: [birth year]        Patient Gender: F Patient Age:   28 years Exam Location:  [facility] Procedure:      VAS [place] LOWER EXTREMITY VENOUS (DVT) Referring Phys: MURALI RAMASWAMY --------------------------------------------------------------------------------  Indications: Edema.  Risk Factors: None identified. Limitations: Poor ultrasound/tissue interface and patient positioning, patient immobility. Comparison Study: No prior studies. Performing Technologist: Oliver Hum RVT  Examination Guidelines: A complete evaluation includes B-mode imaging, spectral Doppler, color Doppler, and power Doppler as needed of all accessible portions of each vessel. Bilateral testing is considered an integral part of a complete examination. Limited examinations for  reoccurring indications may be performed as noted. The reflux portion of the exam is performed with the patient in reverse Trendelenburg.  +---------+---------------+---------+-----------+----------+--------------+ RIGHT    CompressibilityPhasicitySpontaneityPropertiesThrombus Aging +---------+---------------+---------+-----------+----------+--------------+ CFV      Full           Yes      Yes                                 +---------+---------------+---------+-----------+----------+--------------+ SFJ      Full                                                        +---------+---------------+---------+-----------+----------+--------------+  FV Prox  Full                                                        +---------+---------------+---------+-----------+----------+--------------+ FV Mid                  Yes      Yes                                 +---------+---------------+---------+-----------+----------+--------------+ FV Distal               Yes      Yes                                 +---------+---------------+---------+-----------+----------+--------------+ PFV      Full                                                        +---------+---------------+---------+-----------+----------+--------------+ POP      Full           Yes      Yes                                 +---------+---------------+---------+-----------+----------+--------------+ PTV      Full                                                        +---------+---------------+---------+-----------+----------+--------------+ PERO     Full                                                        +---------+---------------+---------+-----------+----------+--------------+   +---------+---------------+---------+-----------+----------+--------------+ LEFT     CompressibilityPhasicitySpontaneityPropertiesThrombus Aging  +---------+---------------+---------+-----------+----------+--------------+ CFV      Full           Yes      Yes                                 +---------+---------------+---------+-----------+----------+--------------+ SFJ      Full                                                        +---------+---------------+---------+-----------+----------+--------------+ FV Prox  Full                                                        +---------+---------------+---------+-----------+----------+--------------+  FV Mid                  Yes      Yes                                 +---------+---------------+---------+-----------+----------+--------------+ FV DistalFull                                                        +---------+---------------+---------+-----------+----------+--------------+ PFV      Full                                                        +---------+---------------+---------+-----------+----------+--------------+ POP      Full           Yes      Yes                                 +---------+---------------+---------+-----------+----------+--------------+ PTV      Full                                                        +---------+---------------+---------+-----------+----------+--------------+ PERO     Full                                                        +---------+---------------+---------+-----------+----------+--------------+     Summary: RIGHT: - There is no evidence of deep vein thrombosis in the lower extremity. However, portions of this examination were limited- see technologist comments above.  - No cystic structure found in the popliteal fossa.  LEFT: - There is no evidence of deep vein thrombosis in the lower extremity. However, portions of this examination were limited- see technologist comments above.  - No cystic structure found in the popliteal fossa.  *See table(s) above for measurements and observations.  Electronically signed by Harold Barban MD on 07/07/2022 at 12:45:35 PM.    Final    DG Abd Portable 1V  Result Date: 07/06/2022 CLINICAL DATA:  Ileus EXAM: PORTABLE ABDOMEN - 1 VIEW COMPARISON:  None Available. FINDINGS: Nonobstructive pattern of small bowel gas. Gas-filled although not overtly distended colon in the right hemiabdomen. Esophagogastric tube with tip and side port below the diaphragm. No obvious free air in the abdomen on supine radiographs. Status post midline laparotomy. IMPRESSION: 1. Nonobstructive pattern of small bowel gas. Gas-filled although not overtly distended colon in the right hemiabdomen. 2. Esophagogastric tube with tip and side port below the diaphragm. 3. Status post midline laparotomy. Electronically Signed   By: Delanna Ahmadi M.D.   On: 07/06/2022 14:32   DG Chest Portable 1 View  Result Date: 07/05/2022 CLINICAL DATA:  Central line placement. EXAM: PORTABLE CHEST 1 VIEW COMPARISON:  April 30, 2022. FINDINGS: The heart size and mediastinal contours are within normal limits. Interval placement of left internal jugular catheter with distal tip in expected position of the SVC. No pneumothorax is noted. Both lungs are clear. The visualized skeletal structures are unremarkable. IMPRESSION: Interval placement of left internal jugular catheter with distal tip in expected position of the SVC. Electronically Signed   By: Marijo Conception M.D.   On: 07/05/2022 17:42   CT Angio Chest/Abd/Pel for Dissection W and/or Wo Contrast  Result Date: 07/05/2022 CLINICAL DATA:  Acute aortic syndrome. EXAM: CT ANGIOGRAPHY CHEST, ABDOMEN AND PELVIS TECHNIQUE: Non-contrast CT of the chest was initially obtained. Multidetector CT imaging through the chest, abdomen and pelvis was performed using the standard protocol during bolus administration of intravenous contrast. Multiplanar reconstructed images and MIPs were obtained and reviewed to evaluate the vascular anatomy. RADIATION DOSE  REDUCTION: This exam was performed according to the departmental dose-optimization program which includes automated exposure control, adjustment of the mA and/or kV according to patient size and/or use of iterative reconstruction technique. CONTRAST:  43m OMNIPAQUE IOHEXOL 350 MG/ML SOLN COMPARISON:  May 16, 2020. FINDINGS: CTA CHEST FINDINGS Cardiovascular: Preferential opacification of the thoracic aorta. No evidence of thoracic aortic aneurysm or dissection. Normal heart size. No pericardial effusion. Mediastinum/Nodes: Moderate size sliding-type hiatal hernia. Thyroid gland is unremarkable. No adenopathy is noted. Lungs/Pleura: No pneumothorax or pleural effusion is noted. Mild bibasilar subsegmental atelectasis is noted. Musculoskeletal: No chest wall abnormality. No acute or significant osseous findings. Review of the MIP images confirms the above findings. CTA ABDOMEN AND PELVIS FINDINGS VASCULAR Aorta: Normal caliber aorta without aneurysm, dissection, vasculitis or significant stenosis. Celiac: Patent without evidence of aneurysm, dissection, vasculitis or significant stenosis. SMA: Patent without evidence of aneurysm, dissection, vasculitis or significant stenosis. Renals: Both renal arteries are patent without evidence of aneurysm, dissection, vasculitis, fibromuscular dysplasia or significant stenosis. IMA: Patent without evidence of aneurysm, dissection, vasculitis or significant stenosis. Inflow: Patent without evidence of aneurysm, dissection, vasculitis or significant stenosis. Veins: No obvious venous abnormality within the limitations of this arterial phase study. Review of the MIP images confirms the above findings. NON-VASCULAR Hepatobiliary: No focal liver abnormality is seen. No gallstones, gallbladder wall thickening, or biliary dilatation. Pancreas: Unremarkable. No pancreatic ductal dilatation or surrounding inflammatory changes. Spleen: Normal in size without focal abnormality.  Adrenals/Urinary Tract: Adrenal glands are unremarkable. Kidneys are normal, without renal calculi, focal lesion, or hydronephrosis. Bladder is unremarkable. Stomach/Bowel: The stomach is unremarkable. There is no small bowel dilatation. The colon appears to be moderately to severely dilated and full of stool. There appears to be some degree of wall thickening involving the sigmoid colon suggesting some degree of infectious or inflammatory colitis. Lymphatic: No adenopathy is noted. Reproductive: Status post hysterectomy. No adnexal masses. Other: No abdominal wall hernia or abnormality. No abdominopelvic ascites. Musculoskeletal: No acute or significant osseous findings. Review of the MIP images confirms the above findings. IMPRESSION: No evidence of thoracic or abdominal aortic dissection or aneurysm. Moderate to severe dilatation of the colon is noted with large amount of stool seen throughout the colon. There appears to be some degree of wall thickening involving the sigmoid colon suggesting infectious or inflammatory colitis. Moderate size sliding-type hiatal hernia. Mild bibasilar subsegmental atelectasis. Electronically Signed   By: JMarijo ConceptionM.D.   On: 07/05/2022 13:33   CT ANGIO HEAD NECK W WO CM (CODE STROKE)  Result Date: 07/05/2022 CLINICAL DATA:  Left-sided weakness. EXAM: CT ANGIOGRAPHY HEAD  AND NECK TECHNIQUE: Multidetector CT imaging of the head and neck was performed using the standard protocol during bolus administration of intravenous contrast. Multiplanar CT image reconstructions and MIPs were obtained to evaluate the vascular anatomy. Carotid stenosis measurements (when applicable) are obtained utilizing NASCET criteria, using the distal internal carotid diameter as the denominator. RADIATION DOSE REDUCTION: This exam was performed according to the departmental dose-optimization program which includes automated exposure control, adjustment of the mA and/or kV according to patient size  and/or use of iterative reconstruction technique. CONTRAST:  43m OMNIPAQUE IOHEXOL 350 MG/ML SOLN COMPARISON:  Head and neck CTA 04/30/2022 FINDINGS: CTA NECK FINDINGS Aortic arch: Normal variant aortic arch branching pattern with common origin of the brachiocephalic and left common carotid arteries. Wide patency of the brachiocephalic and subclavian arteries. Right carotid system: Patent without evidence of a stenosis, dissection, or significant atherosclerosis. Unchanged beading of the distal cervical ICA. Left carotid system: Patent with minimal atheromatous plaque noted in the carotid bulb. No evidence of a significant stenosis or dissection. Unchanged beading of the distal cervical ICA. Vertebral arteries: Patent without evidence of a stenosis, dissection, or significant atherosclerosis. Strongly dominant right vertebral artery. Unchanged 4 mm inferiorly directed outpouching from the right V3 segment at the C1 level. Skeleton: Advanced disc degeneration at C5-6. Interbody ankylosis at C6-7. Advanced facet arthrosis with ankylosis at C4-5. 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 No evidence of cervical lymphadenopathy. Upper chest: No apical lung consolidation or mass. Review of the MIP images confirms the above findings CTA HEAD FINDINGS Anterior circulation: The internal carotid arteries are widely patent from skull base to carotid termini. ACAs and MCAs are patent without evidence of a proximal branch occlusion or significant proximal stenosis. No aneurysm is identified. Posterior circulation: The intracranial right vertebral artery is widely patent and supplies the basilar. The left vertebral artery is diminutive and poorly visualized distal to the PICA origin. Patent bilateral PICA, right AICA, and bilateral SCA origins are visualized. The basilar artery is widely patent. There are small right and large left posterior communicating arteries with hypoplasia of the left P1  segment. Both PCAs are patent without evidence of a significant proximal stenosis. No aneurysm is identified. Venous sinuses: Poorly evaluated due to arterial contrast timing. Anatomic variants: Predominantly fetal type origin of the left PCA. Review of the MIP images confirms the above findings These results were communicated to Dr. KLeonel Ramsayat [email or web address] on 07/05/2022 by text page via the [facility] system. IMPRESSION: 1. Minimal atherosclerosis without an emergent large vessel occlusion or significant proximal stenosis in the head or neck. 2. 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 3. 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 Electronically Signed   By: ALogan BoresM.D.   On: 07/05/2022 11:38   CT HEAD CODE STROKE WO CONTRAST  Result Date: 07/05/2022 CLINICAL DATA:  Code stroke.  Left-sided weakness. EXAM: CT HEAD WITHOUT CONTRAST TECHNIQUE: Contiguous axial images were obtained from the base of the skull through the vertex without intravenous contrast. RADIATION DOSE REDUCTION: This exam was performed according to the departmental dose-optimization program which includes automated exposure control, adjustment of the mA and/or kV according to patient size and/or use of iterative reconstruction technique. COMPARISON:  Head CT and MRI 04/30/2022 FINDINGS: Brain: There is no evidence of an acute infarct, intracranial hemorrhage, mass, midline shift, or extra-axial fluid collection. The ventricles are normal in size. Hypodensities  in the cerebral white matter bilaterally are unchanged and nonspecific but compatible with mild chronic small vessel ischemic disease. Vascular: Calcified atherosclerosis at the skull base. No hyperdense vessel. Skull: No fracture or suspicious osseous lesion. Small bilateral parietal skull osteomas. Sinuses/Orbits: Bilateral cataract extraction. Visualized paranasal sinuses and mastoid air  cells are clear. Other: None. ASPECTS [facility] Stroke Program Early CT Score) - Ganglionic level infarction (caudate, lentiform nuclei, internal capsule, insula, M1-M3 cortex): 7 - Supraganglionic infarction (M4-M6 cortex): 3 Total score (0-10 with 10 being normal): 10 Preliminary findings were communicated by telephone to Dr. Leonel Ramsay on 07/05/2022 at 11:01 a.m. IMPRESSION: 1. No evidence of acute intracranial abnormality. ASPECTS of 10. 2. Mild chronic small vessel ischemic disease. Electronically Signed   By: Logan Bores M.D.   On: 07/05/2022 11:21   (Echo, Carotid, EGD, Colonoscopy, ERCP)    Subjective: No complaints  Discharge Exam: Vitals:   07/16/22 1100 07/16/22 1442  BP: (!) 133/55 131/67  Pulse:  88  Resp:  18  Temp:  99.5 F (37.5 C)  SpO2:     Vitals:   07/16/22 0438 07/16/22 0913 07/16/22 1100 07/16/22 1442  BP: 125/63 (!) 112/52 (!) 133/55 131/67  Pulse: 89 (!) 101  88  Resp: '20 18  18  '$ Temp: 97.7 F (36.5 C) 98.4 F (36.9 C)  99.5 F (37.5 C)  TempSrc: Oral Axillary  Oral  SpO2: 96% 91%    Weight:      Height:        General: Pt is alert, awake, not in acute distress Cardiovascular: RRR, S1/S2 +, no rubs, no gallops Respiratory: CTA bilaterally, no wheezing, no rhonchi Abdominal: Soft, NT, ND, bowel sounds + Extremities: no edema, no cyanosis    The results of significant diagnostics from this hospitalization (including imaging, microbiology, ancillary and laboratory) are listed below for reference.     Microbiology: Recent Results (from the past 240 hour(s))  Urine Culture     Status: None   Collection Time: 07/06/22  4:40 PM   Specimen: In/Out Cath Urine  Result Value Ref Range Status   Specimen Description IN/OUT CATH URINE  Final   Special Requests NONE  Final   Culture   Final    NO GROWTH Performed at [facility], [address], [place], [place] [address]    Report Status 07/07/2022 FINAL  Final     Labs: BNP (last 3  results) No results for input(s): "BNP" in the last 8760 hours. Basic Metabolic Panel: Recent Labs  Lab 07/10/22 1500 07/11/22 0358 07/12/22 0327 07/13/22 0317 07/14/22 0332 07/16/22 0304  NA 142 142 134* 136 134* 134*  K 3.5 3.1* 3.4* 4.0 3.8 3.3*  CL 105 103 94* 97* 97* 99  CO2 '29 31 30 29 27 27  '$ GLUCOSE 174* 102* 140* 173* 124* 110*  BUN '17 12 8 '$ 5* 6* <5*  CREATININE 0.70 0.66 0.58 0.55 0.59 0.61  CALCIUM 7.4* 8.0* 7.7* 8.3* 8.8* 8.2*  MG 1.9  --   --   --   --   --    Liver Function Tests: Recent Labs  Lab 07/10/22 0355  AST 64*  ALT 20  ALKPHOS 158*  BILITOT 0.4  PROT 4.1*  ALBUMIN 1.6*   No results for input(s): "LIPASE", "AMYLASE" in the last 168 hours. No results for input(s): "AMMONIA" in the last 168 hours. CBC: Recent Labs  Lab 07/11/22 0358 07/12/22 0327 07/13/22 0317 07/14/22 0333 07/15/22 0407 07/16/22 0304  WBC  14.4* 13.6* 10.7* 10.4 7.6  --   NEUTROABS  --  11.7* 9.1*  --   --   --   HGB 9.2* 8.9* 7.6* 7.4* 7.2* 8.1*  HCT 28.0* 27.4* 24.3* 23.7* 22.0* 24.7*  MCV 95.2 94.8 96.4 98.8 94.8  --   PLT 188 240 282 366 373  --    Cardiac Enzymes: No results for input(s): "CKTOTAL", "CKMB", "CKMBINDEX", "TROPONINI" in the last 168 hours. BNP: Invalid input(s): "POCBNP" CBG: Recent Labs  Lab 07/15/22 2359 07/16/22 0420 07/16/22 0519 07/16/22 0857 07/16/22 1225  GLUCAP 140* 58* 72 109* 114*   D-Dimer No results for input(s): "DDIMER" in the last 72 hours. Hgb A1c No results for input(s): "HGBA1C" in the last 72 hours. Lipid Profile No results for input(s): "CHOL", "HDL", "LDLCALC", "TRIG", "CHOLHDL", "LDLDIRECT" in the last 72 hours. Thyroid function studies No results for input(s): "TSH", "T4TOTAL", "T3FREE", "THYROIDAB" in the last 72 hours.  Invalid input(s): "FREET3" Anemia work up No results for input(s): "VITAMINB12", "FOLATE", "FERRITIN", "TIBC", "IRON", "RETICCTPCT" in the last 72 hours. Urinalysis    Component Value  Date/Time   COLORURINE AMBER (A) 07/06/2022 1641   APPEARANCEUR HAZY (A) 07/06/2022 1641   LABSPEC 1.020 07/06/2022 1641   PHURINE 5.0 07/06/2022 1641   GLUCOSEU NEGATIVE 07/06/2022 1641   GLUCOSEU NEGATIVE 05/06/2016 1708   HGBUR SMALL (A) 07/06/2022 1641   BILIRUBINUR NEGATIVE 07/06/2022 1641   BILIRUBINUR negative 06/17/2022 [place] 07/06/2022 1641   PROTEINUR NEGATIVE 07/06/2022 1641   UROBILINOGEN 0.2 06/17/2022 1429   UROBILINOGEN 0.2 05/06/2016 1708   NITRITE NEGATIVE 07/06/2022 1641   LEUKOCYTESUR NEGATIVE 07/06/2022 1641   Sepsis Labs Recent Labs  Lab 07/12/22 0327 07/13/22 0317 07/14/22 0333 07/15/22 0407  WBC 13.6* 10.7* 10.4 7.6   Microbiology Recent Results (from the past 240 hour(s))  Urine Culture     Status: None   Collection Time: 07/06/22  4:40 PM   Specimen: In/Out Cath Urine  Result Value Ref Range Status   Specimen Description IN/OUT CATH URINE  Final   Special Requests NONE  Final   Culture   Final    NO GROWTH Performed at [facility], [place] [address] [place], [place] [address]    Report Status 07/07/2022 FINAL  Final     Time coordinating discharge: Over 30 minutes  SIGNED:   Charlynne Cousins, MD  [facility] 07/16/2022, 3:29 PM Pager   If 7PM-7AM, please contact night-coverage [email or web address] Password [email or web address]

## 2022-07-16 NOTE — Progress Notes (Signed)
Report called to [facility] from [facility].

## 2022-07-18 ENCOUNTER — Other Ambulatory Visit: Payer: Medicare Other

## 2022-07-23 ENCOUNTER — Emergency Department (HOSPITAL_COMMUNITY): Payer: Medicare Other

## 2022-07-23 ENCOUNTER — Emergency Department (HOSPITAL_COMMUNITY)
Admission: EM | Admit: 2022-07-23 | Discharge: 2022-07-23 | Disposition: A | Discharge status: Home / Self Care | Payer: Medicare Other | Attending: Emergency Medicine | Admitting: Emergency Medicine

## 2022-07-23 DIAGNOSIS — G44209 Tension-type headache, unspecified, not intractable: Secondary | ICD-10-CM | POA: Diagnosis not present

## 2022-07-23 DIAGNOSIS — Z7982 Long term (current) use of aspirin: Secondary | ICD-10-CM | POA: Insufficient documentation

## 2022-07-23 DIAGNOSIS — Z79899 Other long term (current) drug therapy: Secondary | ICD-10-CM | POA: Diagnosis not present

## 2022-07-23 DIAGNOSIS — Z79890 Hormone replacement therapy (postmenopausal): Secondary | ICD-10-CM | POA: Diagnosis not present

## 2022-07-23 DIAGNOSIS — R269 Unspecified abnormalities of gait and mobility: Secondary | ICD-10-CM | POA: Insufficient documentation

## 2022-07-23 DIAGNOSIS — E039 Hypothyroidism, unspecified: Secondary | ICD-10-CM | POA: Diagnosis not present

## 2022-07-23 DIAGNOSIS — R519 Headache, unspecified: Secondary | ICD-10-CM | POA: Diagnosis present

## 2022-07-23 DIAGNOSIS — Z85528 Personal history of other malignant neoplasm of kidney: Secondary | ICD-10-CM | POA: Diagnosis not present

## 2022-07-23 DIAGNOSIS — I10 Essential (primary) hypertension: Secondary | ICD-10-CM | POA: Diagnosis not present

## 2022-07-23 MED ORDER — ACETAMINOPHEN 500 MG PO TABS
1000.0000 mg | ORAL_TABLET | Freq: Once | ORAL | Status: AC
  Administered 2022-07-23: 1000 mg via ORAL
  Filled 2022-07-23: qty 2

## 2022-07-23 MED ORDER — LACTATED RINGERS IV BOLUS
500.0000 mL | Freq: Once | INTRAVENOUS | Status: AC
  Administered 2022-07-23: 500 mL via INTRAVENOUS

## 2022-07-23 MED ORDER — PROCHLORPERAZINE EDISYLATE 10 MG/2ML IJ SOLN
5.0000 mg | Freq: Once | INTRAMUSCULAR | Status: AC
  Administered 2022-07-23: 5 mg via INTRAVENOUS
  Filled 2022-07-23: qty 2

## 2022-07-23 MED ORDER — MAGNESIUM SULFATE 2 GM/50ML IV SOLN
2.0000 g | Freq: Once | INTRAVENOUS | Status: AC
  Administered 2022-07-23: 2 g via INTRAVENOUS
  Filled 2022-07-23: qty 50

## 2022-07-23 MED ORDER — DIPHENHYDRAMINE HCL 50 MG/ML IJ SOLN
12.5000 mg | Freq: Once | INTRAMUSCULAR | Status: AC
  Administered 2022-07-23: 12.5 mg via INTRAVENOUS
  Filled 2022-07-23: qty 1

## 2022-07-23 NOTE — ED Notes (Signed)
PT to CT.

## 2022-07-23 NOTE — ED Triage Notes (Signed)
PT BIB [facility] for a HA x2weeks.  Refuses to see facility provider b/c she wants a head scan.  Pt has multiple complaints including wanting to see an ENT d/t a recent intubation r/t sepsis/cholecystitis with intubation.  PT is A&O x4 per EMS.  EMS VS:120/70 97% HR90 [id number]

## 2022-07-23 NOTE — ED Provider Notes (Signed)
[place] EMERGENCY DEPARTMENT Provider Note   CSN: [id number] Arrival date & time: 07/23/22  1007     History {Add pertinent medical, surgical, social history, OB history to HPI:1} Chief Complaint  Patient presents with   Headache    [name] is a 81 y.o. female with HTN, fibromyalgia, hypothyroidism, GERD, h/o mobitz I, spinal stenosis presents with fall.  With her friend and daughter at bedside from a rehab facility.  Per chart review she was recently admitted to the hospital from 07/05/2022 to 07/16/2022 for septic shock probably as result of infectious colitis, had an exploratory laparotomy and was in the ICU intubated on pressors.  Was discharged to a skilled nursing facility for rehab.  Patient reports that for the last 10 days she has had a severe constant nonpulsatile nonradiating headache.  No associated photophobia/phonophobia, blurry vision/double vision, numbness tingling, weakness, nausea vomiting.  She states that the facility will give her Tylenol or oxycodone and then she sleeps for a few hours but wakes up again with the pain.  Endorses the pain currently is a 20 out of 10.  Never had a headache like this before, is not normally a headache person.  No trauma.  Does not take any blood thinners.  Denies any fever/chills.  Has had difficulty swallowing and had a swallow study done yesterday and is now on a liquid diet possibly from complications due to her trach while intubated.  She was already referred to see ENT as an outpatient.  Headache      Home Medications Prior to Admission medications   Medication Sig Start Date End Date Taking? Authorizing Provider  ALPRAZolam Duanne Moron) 0.5 MG tablet Take 1 tablet (0.5 mg total) by mouth 2 (two) times daily as needed. for anxiety 07/16/22   Charlynne Cousins, MD  Artificial Tear Ointment (DRY EYES OP) Apply 1 drop to eye 3 (three) times daily as needed (for dry eyes).    [provider]   aspirin EC 81 MG tablet Take 1 tablet (81 mg total) by mouth daily. Swallow whole. Patient taking differently: Take 81 mg by mouth daily. 05/02/22   Regalado, Belkys A, MD  buPROPion (WELLBUTRIN) 75 MG tablet Take 75 mg by mouth daily. 02/28/20   [provider]  calcium-vitamin D (OSCAL WITH D) 500-200 MG-UNIT per tablet Take 1 tablet by mouth daily.    [provider]  diphenoxylate-atropine (LOMOTIL) 2.5-0.025 MG/5ML liquid Take 5 mLs by mouth 4 (four) times daily as needed for diarrhea or loose stools. 07/16/22   Charlynne Cousins, MD  DULoxetine (CYMBALTA) 60 MG capsule Take 60 mg by mouth daily.    [provider]  levothyroxine (SYNTHROID) 75 MCG tablet Take 75 mcg by mouth daily before breakfast.    [provider]  lidocaine (XYLOCAINE) 2 % solution Use as directed 15 mLs in the mouth or throat every 3 (three) hours as needed for mouth pain. 07/16/22   Charlynne Cousins, MD  lisinopril (ZESTRIL) 20 MG tablet Take 1 tablet (20 mg total) by mouth daily. 07/30/22   Charlynne Cousins, MD  methylcellulose (CITRUCEL) oral powder 2 tablespoons daily Patient taking differently: Take 1 packet by mouth daily. 03/08/21   Irene Shipper, MD  metoprolol succinate (TOPROL-XL) 50 MG 24 hr tablet Take 1 tablet (50 mg total) by mouth daily. Take with or immediately following a meal. NEED ANNUAL VISIT FOR FURTHER REFILLS Patient not taking: Reported on 06/27/2022 04/29/17   Sharlet Salina,  Real Cons, MD  Multiple Vitamins-Minerals (MULTIVITAMIN WITH MINERALS) tablet Take 1 tablet by mouth daily.    [provider]  omeprazole (PRILOSEC) 40 MG capsule Take 1 capsule (40 mg total) by mouth daily. NEED ANNUAL VISIT FOR FURTHER REFILLS Patient taking differently: Take 40 mg by mouth daily. 04/29/17   Hoyt Koch, MD  psyllium (HYDROCIL/METAMUCIL) 95 % PACK Take 1 packet by mouth 2 (two) times daily. 07/16/22   Charlynne Cousins, MD  rosuvastatin (CRESTOR)  10 MG tablet Take 1 tablet (10 mg total) by mouth daily. Patient not taking: Reported on 07/05/2022 05/01/22 05/01/23  Regalado, Jerald Kief A, MD  Sennosides (SENNA LAX PO) Take 1 tablet by mouth daily.    [provider]  Zinc Oxide (TRIPLE PASTE) 12.8 % ointment Apply topically as needed for irritation. 07/16/22   Charlynne Cousins, MD      Allergies    Morphine and related    Review of Systems   Review of Systems  Neurological:  Positive for headaches.   Review of systems {pos/neg:18640::"Negative","Positive"} for ***.  A 10 point review of systems was performed and is negative unless otherwise reported in HPI.  Physical Exam Updated Vital Signs BP 126/62 (BP Location: Right Arm)   Pulse 90   Temp 98.3 F (36.8 C) (Oral)   Resp 16   SpO2 94%  Physical Exam General: Normal appearing female, lying in bed.  HEENT: PERRLA, Sclera anicteric, MMM, trachea midline.  Cardiology: RRR, no murmurs/rubs/gallops. BL radial and DP pulses equal bilaterally.  Resp: Normal respiratory rate and effort. CTAB, no wheezes, rhonchi, crackles.  Abd: Soft, non-tender, non-distended. No rebound tenderness or guarding.  GU: Deferred. MSK: No peripheral edema or signs of trauma. Extremities without deformity or TTP. No cyanosis or clubbing. Skin: warm, dry. No rashes or lesions. Back: No CVA tenderness Neuro: A&Ox4, CNs II-XII grossly intact. MAEs. Sensation grossly intact.  Psych: Normal mood and affect.   ED Results / Procedures / Treatments   Labs (all labs ordered are listed, but only abnormal results are displayed) Labs Reviewed  PROTIME-INR  CBC WITH DIFFERENTIAL/PLATELET  COMPREHENSIVE METABOLIC PANEL    EKG None  Radiology No results found.  Procedures Procedures  {Document cardiac monitor, telemetry assessment procedure when appropriate:1}  Medications Ordered in ED Medications  diphenhydrAMINE (BENADRYL) injection 12.5 mg (has no administration in time range)   acetaminophen (TYLENOL) tablet 1,000 mg (has no administration in time range)  magnesium sulfate IVPB 2 g 50 mL (has no administration in time range)  prochlorperazine (COMPAZINE) injection 5 mg (has no administration in time range)  lactated ringers bolus 500 mL (has no administration in time range)    ED Course/ Medical Decision Making/ A&P                          Medical Decision Making   This patient presents to the ED for concern of headache, this involves an extensive number of treatment options, and is a complaint that carries with it a high risk of complications and morbidity.  I considered the following differential and admission for this acute, potentially life threatening condition.   MDM:    Consider tension vs migraine headache for patient. Consider ICH though no blood thinners or trauma but with new type of headache that is constant and unremitting will get CT head.  No stroke-like symptoms to indicate a CVA.  Clinical Course as of 07/23/22 1145  Tue Jul 23, 2022  1053 Tried to evaluate patient but she requested I wait until family present at bedside who are in waiting room currently. She is calling them to come [HN]    Clinical Course User Index [HN] Audley Hose, MD    Labs: I Ordered, and personally interpreted labs.  The pertinent results include:  ***  Imaging Studies ordered: I ordered imaging studies including *** I independently visualized and interpreted imaging. I agree with the radiologist interpretation  Additional history obtained from ***.  External records from outside source obtained and reviewed including ***  Cardiac Monitoring: The patient was maintained on a cardiac monitor.  I personally viewed and interpreted the cardiac monitored which showed an underlying rhythm of: ***  Reevaluation: After the interventions noted above, I reevaluated the patient and found that they have :{resolved/improved/worsened:23923::"improved"}  Social  Determinants of Health: ***  Disposition:  ***  Co morbidities that complicate the patient evaluation  Past Medical History:  Diagnosis Date   Cancer ([place]) 2014   kidney   Esophageal stricture    Fibromyalgia    GERD (gastroesophageal reflux disease)    Helicobacter pylori gastritis    Hypertension    Intention tremor    Lumbago    Memory loss    Osteoarthritis of spine    Pneumonia    hx of years ago    PUD (peptic ulcer disease)    PVD (peripheral vascular disease) ([place])      Medicines Meds ordered this encounter  Medications   diphenhydrAMINE (BENADRYL) injection 12.5 mg   acetaminophen (TYLENOL) tablet 1,000 mg   magnesium sulfate IVPB 2 g 50 mL   prochlorperazine (COMPAZINE) injection 5 mg   lactated ringers bolus 500 mL    I have reviewed the patients home medicines and have made adjustments as needed  Problem List / ED Course: Problem List Items Addressed This Visit   None        Clinical Course as of 07/23/22 1145  Tue Jul 23, 2022  1053 Tried to evaluate patient but she requested I wait until family present at bedside who are in waiting room currently. She is calling them to come [HN]    Clinical Course User Index [HN] Audley Hose, MD    {Document critical care time when appropriate:1} {Document review of labs and clinical decision tools ie heart score, Chads2Vasc2 etc:1}  {Document your independent review of radiology images, and any outside records:1} {Document your discussion with family members, caretakers, and with consultants:1} {Document social determinants of health affecting pt's care:1} {Document your decision making why or why not admission, treatments were needed:1}  This note was created using dictation software, which may contain spelling or grammatical errors.

## 2022-07-23 NOTE — ED Notes (Signed)
This RN assumed care of patient. Pt reports pressure on whole head "like someone is squeezing an orange". Pt stated she would like imaging and to consult ENT for throat pain after being intubated for 4 days. Pt reports being unhappy with SNF symptom management and is seeking further diagnostic procedures. Pt is non-ambulatory at baseline and presents without limb or facial deficits. Pt reports alleviated headache and is able to sleep when she "begs for an oxy in the middle of the night". Pt denies photophobia and audio sensitivity. Pt denies CP & SOB. NAD noted at this time. Pt is connected to BP & pulse ox.

## 2022-07-23 NOTE — ED Notes (Signed)
PTAR called to go to [facility], ETA 3rd out.

## 2022-07-23 NOTE — ED Notes (Signed)
Pt sleeping on gurney at this time. NAD noted. Support person at bedside.

## 2022-07-23 NOTE — Discharge Instructions (Addendum)
Thank you for coming to [facility] Emergency Department. You were seen for headache. We did an exam, and imaging, and these showed no acute findings. We treated your headache which improved.  Please follow up with your primary care provider within 1 week. Please follow up with ENT as originally scheduled. We have also placed a neurology referral for your headaches, and you can follow in clinic with them. You can take 1,000 mg of tylenol every 8 hours for headaches.  Do not hesitate to return to the ED or call 911 if you experience: -Worsening symptoms -Lightheadedness, passing out -Fevers/chills -Anything else that concerns you

## 2022-07-23 NOTE — ED Notes (Signed)
[ ]   CT, labs, HA cocktail   Audley Hose, MD 07/23/22 1320

## 2022-07-23 NOTE — ED Notes (Signed)
Pt in hallway A&O X3.Marland Kitchen Updated on plan of care. Awaiting transport. Paperwork already completed by previous Therapist, sports. Pt given Ice water and blanket.

## 2022-07-29 ENCOUNTER — Ambulatory Visit: Payer: Medicare Other | Admitting: Surgery

## 2022-08-08 ENCOUNTER — Ambulatory Visit: Payer: Medicare Other | Admitting: Internal Medicine

## 2022-08-08 ENCOUNTER — Encounter: Payer: Self-pay | Admitting: Internal Medicine

## 2022-08-08 VITALS — BP 112/50 | HR 98 | Ht 62.0 in | Wt 138.0 lb

## 2022-08-08 DIAGNOSIS — I10 Essential (primary) hypertension: Secondary | ICD-10-CM

## 2022-08-08 DIAGNOSIS — Z9889 Other specified postprocedural states: Secondary | ICD-10-CM

## 2022-08-08 NOTE — Progress Notes (Signed)
Primary Physician/Referring:  Reynold Bowen, MD  Patient ID: [name], female    DOB: 1941/02/20, 81 y.o.   MRN: [id number]  Chief Complaint  Patient presents with   Abnormal ECG   Follow-up   Results   HPI:    [name]  is a 81 y.o. female with past medical history significant for hypertension and fibromyalgia who is here for a follow-up visit.  Patient has had quite an extensive medical course since our last visit.  About a month ago she was in the hospital where she ended up having an exploratory laparotomy for suspected bowel ischemia.  A part of her intestine was removed and recovery has been taking quite some time.  Patient does not like the rehab facility where she is staying and she would really like to go home with home health care.  She has not yet had a stress test or her event monitor given her recent health scares.  Patient would like to do the event monitor when she goes back home and the stress test can certainly wait.  She does not like being outside of her home and she does not feel like she is being taking care of well at her current facility.  I discussed at length with patient and her son regarding recovery and that a prolonged course in this case is normal.  From a cardiac standpoint patient feels she is doing very well.  Patient denies chest pain, palpitations, diaphoresis, orthopnea, edema, claudication.  Past Medical History:  Diagnosis Date   Cancer ([place]) 2014   kidney   Esophageal stricture    Fibromyalgia    GERD (gastroesophageal reflux disease)    Helicobacter pylori gastritis    Hypertension    Intention tremor    Lumbago    Memory loss    Osteoarthritis of spine    Pneumonia    hx of years ago    PUD (peptic ulcer disease)    PVD (peripheral vascular disease) ([place])    Past Surgical History:  Procedure Laterality Date   ABDOMINAL HYSTERECTOMY  1985   BREAST ENHANCEMENT SURGERY  1974   CATARACT EXTRACTION Bilateral 2014   [facility]    LAPAROSCOPIC PARTIAL NEPHRECTOMY Left April '13   [facility], Dr. Alinda Money   LAPAROTOMY N/A 07/05/2022   Procedure: EXPLORATORY LAPAROTOMY;  Surgeon: Ileana Roup, MD;  Location: [facility];  Service: General;  Laterality: N/A;   LUMBAR FUSION  03/2003   L4-5   TONSILLECTOMY  1958   TUBAL LIGATION  1984   Family History  Problem Relation Age of Onset   Heart disease Mother    COPD Mother    Thyroid disease Mother    Diabetes Father    Heart disease Father        CABG   Stroke Father    Parkinson's disease Father    Heart disease Brother    Stroke Brother    Hyperlipidemia Brother    Diabetes Daughter    Hearing loss Brother    Crohn's disease Other        neice   Hypertension Son    Diabetes Son    Melanoma Son    Colon cancer Neg Hx    Stomach cancer Neg Hx    Esophageal cancer Neg Hx     Social History   Tobacco Use   Smoking status: Never   Smokeless tobacco: Never  Substance Use Topics   Alcohol use: Yes    Alcohol/week:  7.0 standard drinks of alcohol    Types: 7 Glasses of wine per week    Comment: occasional glass of wine with dinner    Marital Status: Widowed  ROS  Review of Systems  Constitutional: Positive for malaise/fatigue.  Cardiovascular:  Negative for dyspnea on exertion and near-syncope.  Gastrointestinal:  Positive for abdominal pain and change in bowel habit.  Neurological:  Positive for light-headedness and weakness. Negative for dizziness.   Objective  Blood pressure (!) 112/50, pulse 98, height '5\' 2"'$  (1.575 m), weight 138 lb (62.6 kg), SpO2 94 %. Body mass index is 25.24 kg/m.     08/08/2022   10:55 AM 07/23/2022   10:11 AM 07/16/2022    2:42 PM  Vitals with BMI  Height '5\' 2"'$     Weight 138 lbs    BMI 67.67    Systolic 209 470 962  Diastolic 50 62 67  Pulse 98 90 88     Physical Exam Vitals reviewed.  Constitutional:      Appearance: Normal appearance.  HENT:     Head: Normocephalic and atraumatic.  Cardiovascular:     Rate and  Rhythm: Normal rate and regular rhythm.     Pulses: Normal pulses.     Heart sounds: Murmur heard.  Pulmonary:     Effort: Pulmonary effort is normal.     Breath sounds: Normal breath sounds.  Abdominal:     General: Bowel sounds are normal.  Musculoskeletal:     Right lower leg: No edema.     Left lower leg: No edema.  Skin:    General: Skin is warm and dry.  Neurological:     Mental Status: She is alert.     Medications and allergies   Allergies  Allergen Reactions   Morphine And Related Itching and Rash    Given in IV.     Medication list after today's encounter   Current Outpatient Medications:    ALPRAZolam (XANAX) 0.5 MG tablet, Take 1 tablet (0.5 mg total) by mouth 2 (two) times daily as needed. for anxiety, Disp: 5 tablet, Rfl: 1   Artificial Tear Ointment (DRY EYES OP), Apply 1 drop to eye 3 (three) times daily as needed (for dry eyes)., Disp: , Rfl:    aspirin EC 81 MG tablet, Take 1 tablet (81 mg total) by mouth daily. Swallow whole. (Patient taking differently: Take 81 mg by mouth daily.), Disp: 30 tablet, Rfl: 12   buPROPion (WELLBUTRIN) 75 MG tablet, Take 75 mg by mouth daily., Disp: , Rfl:    calcium-vitamin D (OSCAL WITH D) 500-200 MG-UNIT per tablet, Take 1 tablet by mouth daily., Disp: , Rfl:    diphenoxylate-atropine (LOMOTIL) 2.5-0.025 MG/5ML liquid, Take 5 mLs by mouth 4 (four) times daily as needed for diarrhea or loose stools., Disp: 60 mL, Rfl: 0   DULoxetine (CYMBALTA) 60 MG capsule, Take 60 mg by mouth daily., Disp: , Rfl:    levothyroxine (SYNTHROID) 75 MCG tablet, Take 75 mcg by mouth daily before breakfast., Disp: , Rfl:    lidocaine (XYLOCAINE) 2 % solution, Use as directed 15 mLs in the mouth or throat every 3 (three) hours as needed for mouth pain., Disp: 90 mL, Rfl: 0   lisinopril (ZESTRIL) 20 MG tablet, Take 1 tablet (20 mg total) by mouth daily., Disp: , Rfl: 3   methylcellulose (CITRUCEL) oral powder, 2 tablespoons daily (Patient taking  differently: Take 1 packet by mouth daily.), Disp: , Rfl:    metoprolol succinate (TOPROL-XL)  50 MG 24 hr tablet, Take 1 tablet (50 mg total) by mouth daily. Take with or immediately following a meal. NEED ANNUAL VISIT FOR FURTHER REFILLS, Disp: 90 tablet, Rfl: 0   Multiple Vitamins-Minerals (MULTIVITAMIN WITH MINERALS) tablet, Take 1 tablet by mouth daily., Disp: , Rfl:    omeprazole (PRILOSEC) 40 MG capsule, Take 1 capsule (40 mg total) by mouth daily. NEED ANNUAL VISIT FOR FURTHER REFILLS (Patient taking differently: Take 40 mg by mouth daily.), Disp: 90 capsule, Rfl: 0   oxycodone (OXY-IR) 5 MG capsule, Take 5 mg by mouth every 4 (four) hours as needed., Disp: , Rfl:    psyllium (HYDROCIL/METAMUCIL) 95 % PACK, Take 1 packet by mouth 2 (two) times daily., Disp: 240 each, Rfl:    Sennosides (SENNA LAX PO), Take 1 tablet by mouth daily., Disp: , Rfl:    Zinc Oxide (TRIPLE PASTE) 12.8 % ointment, Apply topically as needed for irritation., Disp: 56.7 g, Rfl: 0   rosuvastatin (CRESTOR) 10 MG tablet, Take 1 tablet (10 mg total) by mouth daily. (Patient not taking: Reported on 07/05/2022), Disp: 30 tablet, Rfl: 11  Laboratory examination:   Lab Results  Component Value Date   NA 134 (L) 07/16/2022   K 3.3 (L) 07/16/2022   CO2 27 07/16/2022   GLUCOSE 110 (H) 07/16/2022   BUN <5 (L) 07/16/2022   CREATININE 0.61 07/16/2022   CALCIUM 8.2 (L) 07/16/2022   GFRNONAA >60 07/16/2022       Latest Ref Rng & Units 07/16/2022    3:04 AM 07/14/2022    3:32 AM 07/13/2022    3:17 AM  CMP  Glucose 70 - 99 mg/dL 110  124  173   BUN 8 - 23 mg/dL '5  6  5   '$ Creatinine 0.44 - 1.00 mg/dL 0.61  0.59  0.55   Sodium 135 - 145 mmol/L 134  134  136   Potassium 3.5 - 5.1 mmol/L 3.3  3.8  4.0   Chloride 98 - 111 mmol/L 99  97  97   CO2 22 - 32 mmol/L '27  27  29   '$ Calcium 8.9 - 10.3 mg/dL 8.2  8.8  8.3       Latest Ref Rng & Units 07/16/2022    3:04 AM 07/15/2022    4:07 AM 07/14/2022    3:33 AM  CBC  WBC 4.0 -  10.5 K/uL  7.6  10.4   Hemoglobin 12.0 - 15.0 g/dL 8.1  7.2  7.4   Hematocrit 36.0 - 46.0 % 24.7  22.0  23.7   Platelets 150 - 400 K/uL  373  366     Lipid Panel Recent Labs    05/01/22 0757 07/06/22 0312  CHOL 141  --   TRIG 46 93  LDLCALC 80  --   VLDL 9  --   HDL 52  --   CHOLHDL 2.7  --     HEMOGLOBIN A1C Lab Results  Component Value Date   HGBA1C 5.7 (H) 04/30/2022   MPG 116.89 04/30/2022   TSH Recent Labs    04/30/22 1247 07/05/22 2143  TSH 2.444 4.392    External labs:     Radiology:    Cardiac Studies:   07/07/2022 Summary LE Venous Duplex:  RIGHT:  - There is no evidence of deep vein thrombosis in the lower extremity.  However, portions of this examination were limited- see technologist  comments above.  - No cystic structure found in the popliteal fossa.  LEFT:  - There is no evidence of deep vein thrombosis in the lower extremity.  However, portions of this examination were limited- see technologist  comments above.  - No cystic structure found in the popliteal fossa.    IMPRESSIONS ECHO 07/06/2022  1. Left ventricular ejection fraction, by estimation, is 50%. Left  ventricular ejection fraction by 2D MOD biplane is 45.8 %. The left  ventricle has low normal function. The left ventricle has no regional wall  motion abnormalities. Left ventricular  diastolic parameters are consistent with Grade I diastolic dysfunction  (impaired relaxation).   2. Right ventricular systolic function is normal. The right ventricular  size is normal.   3. The mitral valve is grossly normal. Trivial mitral valve  regurgitation.   4. The aortic valve is calcified. Aortic valve regurgitation is not  visualized. Aortic valve sclerosis is present, with no evidence of aortic  valve stenosis.    EKG:   06/27/2022: NSR with rate variation, normal R wave progression, no evidence of ischemia  06/21/2022: NSR with second degree type I AV block, prolonged  QTc  Assessment     ICD-10-CM   1. Essential hypertension  I10     2. S/P exploratory laparotomy  Z98.890        No orders of the defined types were placed in this encounter.   No orders of the defined types were placed in this encounter.   There are no discontinued medications.   Recommendations:   [name] is a 81 y.o.  female with HTN who is s/p ex-lap   S/P exploratory laparotomy  Patient currently in rehab facility She is distressed about her recovery and how long it is taking   Essential hypertension Continue current cardiac medications. Encourage low-sodium diet, less than 2000 mg daily. BP very well controlled at this time   Dizziness Echocardiogram completed and reviewed Patient likely orthostatic and dehydrated Stress test pending   Follow-up in 3 months or sooner if needed    Floydene Flock, DO, [facility]  08/12/2022, 2:07 PM Office: [phone] Pager: [phone]

## 2022-08-14 ENCOUNTER — Ambulatory Visit: Payer: Medicare Other | Admitting: Gastroenterology

## 2022-08-19 ENCOUNTER — Telehealth: Payer: Self-pay | Admitting: Internal Medicine

## 2022-08-19 NOTE — Telephone Encounter (Signed)
Pt states she had emergency colon surgery and is in a rehab facility. She was having diarrhea and went to the surgical office and testes positive for cdiff. They have started her on vanco 4 times a day for 10 days. Discussed with her that is usually what cdiff is treated with first. She wanted to know about another drug dificid. Let her know that is usually used if the vancomycin does not clear the bacteria.

## 2022-08-19 NOTE — Telephone Encounter (Signed)
PT is at [facility] rehab because she just had emergency colon surgery for c-diff and she was prescribed medication and wants to know if the best for her based on her issues. She has lost 5-10 pounds a week and has a lot of diarrhea.Please advise.

## 2022-08-20 NOTE — Telephone Encounter (Signed)
PT is calling back about c diff medication. Says that she is very concerned about the weight loss and that she wants to be prescribed the most powerful prescription to cure this. Please advise

## 2022-08-20 NOTE — Telephone Encounter (Signed)
Called and left message that pt should contact [facility] as they are the ones that have been treating her. Left her the phone number for [facility] on her voicemail.

## 2022-08-28 ENCOUNTER — Encounter (HOSPITAL_COMMUNITY): Payer: Self-pay

## 2022-08-28 ENCOUNTER — Inpatient Hospital Stay (HOSPITAL_COMMUNITY)
Admission: EM | Admit: 2022-08-28 | Discharge: 2022-09-11 | DRG: 385 | Disposition: A | Discharge status: Skilled Nursing Facility | Payer: Medicare Other | Source: Skilled Nursing Facility | Attending: Internal Medicine | Admitting: Internal Medicine

## 2022-08-28 ENCOUNTER — Other Ambulatory Visit: Payer: Self-pay

## 2022-08-28 DIAGNOSIS — E43 Unspecified severe protein-calorie malnutrition: Secondary | ICD-10-CM | POA: Diagnosis present

## 2022-08-28 DIAGNOSIS — F418 Other specified anxiety disorders: Secondary | ICD-10-CM | POA: Diagnosis not present

## 2022-08-28 DIAGNOSIS — Z8249 Family history of ischemic heart disease and other diseases of the circulatory system: Secondary | ICD-10-CM

## 2022-08-28 DIAGNOSIS — G459 Transient cerebral ischemic attack, unspecified: Secondary | ICD-10-CM | POA: Diagnosis present

## 2022-08-28 DIAGNOSIS — E8809 Other disorders of plasma-protein metabolism, not elsewhere classified: Secondary | ICD-10-CM | POA: Diagnosis not present

## 2022-08-28 DIAGNOSIS — Z85528 Personal history of other malignant neoplasm of kidney: Secondary | ICD-10-CM

## 2022-08-28 DIAGNOSIS — Z66 Do not resuscitate: Secondary | ICD-10-CM | POA: Diagnosis present

## 2022-08-28 DIAGNOSIS — Z79890 Hormone replacement therapy (postmenopausal): Secondary | ICD-10-CM

## 2022-08-28 DIAGNOSIS — Z823 Family history of stroke: Secondary | ICD-10-CM

## 2022-08-28 DIAGNOSIS — K5010 Crohn's disease of large intestine without complications: Secondary | ICD-10-CM | POA: Diagnosis not present

## 2022-08-28 DIAGNOSIS — Z885 Allergy status to narcotic agent status: Secondary | ICD-10-CM

## 2022-08-28 DIAGNOSIS — E871 Hypo-osmolality and hyponatremia: Secondary | ICD-10-CM | POA: Diagnosis present

## 2022-08-28 DIAGNOSIS — Z905 Acquired absence of kidney: Secondary | ICD-10-CM

## 2022-08-28 DIAGNOSIS — Z9841 Cataract extraction status, right eye: Secondary | ICD-10-CM

## 2022-08-28 DIAGNOSIS — Z825 Family history of asthma and other chronic lower respiratory diseases: Secondary | ICD-10-CM

## 2022-08-28 DIAGNOSIS — M797 Fibromyalgia: Secondary | ICD-10-CM | POA: Diagnosis present

## 2022-08-28 DIAGNOSIS — K219 Gastro-esophageal reflux disease without esophagitis: Secondary | ICD-10-CM | POA: Diagnosis present

## 2022-08-28 DIAGNOSIS — K633 Ulcer of intestine: Secondary | ICD-10-CM | POA: Diagnosis present

## 2022-08-28 DIAGNOSIS — I10 Essential (primary) hypertension: Secondary | ICD-10-CM | POA: Diagnosis not present

## 2022-08-28 DIAGNOSIS — Z79899 Other long term (current) drug therapy: Secondary | ICD-10-CM

## 2022-08-28 DIAGNOSIS — Z9842 Cataract extraction status, left eye: Secondary | ICD-10-CM

## 2022-08-28 DIAGNOSIS — E876 Hypokalemia: Secondary | ICD-10-CM | POA: Diagnosis not present

## 2022-08-28 DIAGNOSIS — Z981 Arthrodesis status: Secondary | ICD-10-CM

## 2022-08-28 DIAGNOSIS — Z8349 Family history of other endocrine, nutritional and metabolic diseases: Secondary | ICD-10-CM

## 2022-08-28 DIAGNOSIS — R197 Diarrhea, unspecified: Secondary | ICD-10-CM | POA: Diagnosis not present

## 2022-08-28 DIAGNOSIS — A0472 Enterocolitis due to Clostridium difficile, not specified as recurrent: Principal | ICD-10-CM

## 2022-08-28 DIAGNOSIS — E039 Hypothyroidism, unspecified: Secondary | ICD-10-CM | POA: Diagnosis present

## 2022-08-28 DIAGNOSIS — Z6824 Body mass index (BMI) 24.0-24.9, adult: Secondary | ICD-10-CM

## 2022-08-28 DIAGNOSIS — Z7982 Long term (current) use of aspirin: Secondary | ICD-10-CM

## 2022-08-28 DIAGNOSIS — R1310 Dysphagia, unspecified: Secondary | ICD-10-CM | POA: Diagnosis present

## 2022-08-28 DIAGNOSIS — F419 Anxiety disorder, unspecified: Secondary | ICD-10-CM | POA: Diagnosis present

## 2022-08-28 DIAGNOSIS — I739 Peripheral vascular disease, unspecified: Secondary | ICD-10-CM | POA: Diagnosis present

## 2022-08-28 DIAGNOSIS — D638 Anemia in other chronic diseases classified elsewhere: Secondary | ICD-10-CM | POA: Diagnosis present

## 2022-08-28 DIAGNOSIS — E861 Hypovolemia: Secondary | ICD-10-CM | POA: Diagnosis present

## 2022-08-28 DIAGNOSIS — F32A Depression, unspecified: Secondary | ICD-10-CM | POA: Diagnosis present

## 2022-08-28 DIAGNOSIS — K529 Noninfective gastroenteritis and colitis, unspecified: Secondary | ICD-10-CM

## 2022-08-28 DIAGNOSIS — Z8619 Personal history of other infectious and parasitic diseases: Secondary | ICD-10-CM

## 2022-08-28 DIAGNOSIS — Z833 Family history of diabetes mellitus: Secondary | ICD-10-CM

## 2022-08-28 DIAGNOSIS — Z8673 Personal history of transient ischemic attack (TIA), and cerebral infarction without residual deficits: Secondary | ICD-10-CM

## 2022-08-28 DIAGNOSIS — Z820 Family history of epilepsy and other diseases of the nervous system: Secondary | ICD-10-CM

## 2022-08-28 DIAGNOSIS — E860 Dehydration: Secondary | ICD-10-CM | POA: Diagnosis present

## 2022-08-28 DIAGNOSIS — Z83438 Family history of other disorder of lipoprotein metabolism and other lipidemia: Secondary | ICD-10-CM

## 2022-08-28 DIAGNOSIS — E440 Moderate protein-calorie malnutrition: Secondary | ICD-10-CM

## 2022-08-28 DIAGNOSIS — K648 Other hemorrhoids: Secondary | ICD-10-CM | POA: Diagnosis present

## 2022-08-28 LAB — CBC WITH DIFFERENTIAL/PLATELET
Abs Immature Granulocytes: 0.02 10*3/uL (ref 0.00–0.07)
Basophils Absolute: 0 10*3/uL (ref 0.0–0.1)
Basophils Relative: 1 %
Eosinophils Absolute: 0.1 10*3/uL (ref 0.0–0.5)
Eosinophils Relative: 2 %
HCT: 30.6 % — ABNORMAL LOW (ref 36.0–46.0)
Hemoglobin: 9.1 g/dL — ABNORMAL LOW (ref 12.0–15.0)
Immature Granulocytes: 0 %
Lymphocytes Relative: 60 %
Lymphs Abs: 4 10*3/uL (ref 0.7–4.0)
MCH: 29.4 pg (ref 26.0–34.0)
MCHC: 29.7 g/dL — ABNORMAL LOW (ref 30.0–36.0)
MCV: 98.7 fL (ref 80.0–100.0)
Monocytes Absolute: 0.8 10*3/uL (ref 0.1–1.0)
Monocytes Relative: 12 %
Neutro Abs: 1.6 10*3/uL — ABNORMAL LOW (ref 1.7–7.7)
Neutrophils Relative %: 25 %
Platelets: 342 10*3/uL (ref 150–400)
RBC: 3.1 MIL/uL — ABNORMAL LOW (ref 3.87–5.11)
RDW: 16.5 % — ABNORMAL HIGH (ref 11.5–15.5)
WBC: 6.6 10*3/uL (ref 4.0–10.5)
nRBC: 0.3 % — ABNORMAL HIGH (ref 0.0–0.2)

## 2022-08-28 LAB — COMPREHENSIVE METABOLIC PANEL
ALT: 13 U/L (ref 0–44)
AST: 40 U/L (ref 15–41)
Albumin: 1.8 g/dL — ABNORMAL LOW (ref 3.5–5.0)
Alkaline Phosphatase: 53 U/L (ref 38–126)
Anion gap: 11 (ref 5–15)
BUN: 7 mg/dL — ABNORMAL LOW (ref 8–23)
CO2: 19 mmol/L — ABNORMAL LOW (ref 22–32)
Calcium: 7.6 mg/dL — ABNORMAL LOW (ref 8.9–10.3)
Chloride: 99 mmol/L (ref 98–111)
Creatinine, Ser: 0.52 mg/dL (ref 0.44–1.00)
GFR, Estimated: >60 mL/min (ref >60)
Glucose, Bld: 100 mg/dL — ABNORMAL HIGH (ref 70–99)
Potassium: 5.1 mmol/L (ref 3.5–5.1)
Sodium: 129 mmol/L — ABNORMAL LOW (ref 135–145)
Total Bilirubin: 1.2 mg/dL (ref 0.3–1.2)
Total Protein: 4.3 g/dL — ABNORMAL LOW (ref 6.5–8.1)

## 2022-08-28 LAB — MAGNESIUM: Magnesium: 1.8 mg/dL (ref 1.7–2.4)

## 2022-08-28 MED ORDER — ROSUVASTATIN CALCIUM 5 MG PO TABS
10.0000 mg | ORAL_TABLET | Freq: Every day | ORAL | Status: DC
  Administered 2022-08-29 – 2022-09-06 (×9): 10 mg via ORAL
  Administered 2022-09-07: 5 mg via ORAL
  Administered 2022-09-08 – 2022-09-11 (×4): 10 mg via ORAL
  Filled 2022-08-28 (×15): qty 2

## 2022-08-28 MED ORDER — ALPRAZOLAM 0.5 MG PO TABS
0.5000 mg | ORAL_TABLET | Freq: Two times a day (BID) | ORAL | Status: DC | PRN
  Administered 2022-08-29 – 2022-09-09 (×14): 0.5 mg via ORAL
  Filled 2022-08-28 (×15): qty 1

## 2022-08-28 MED ORDER — BUPROPION HCL 75 MG PO TABS
75.0000 mg | ORAL_TABLET | Freq: Every day | ORAL | Status: DC
  Administered 2022-08-29 – 2022-09-11 (×14): 75 mg via ORAL
  Filled 2022-08-28 (×14): qty 1

## 2022-08-28 MED ORDER — PANTOPRAZOLE SODIUM 40 MG PO TBEC
40.0000 mg | DELAYED_RELEASE_TABLET | Freq: Every day | ORAL | Status: DC
  Administered 2022-08-29 – 2022-09-11 (×14): 40 mg via ORAL
  Filled 2022-08-28 (×14): qty 1

## 2022-08-28 MED ORDER — METOPROLOL SUCCINATE ER 50 MG PO TB24
50.0000 mg | ORAL_TABLET | Freq: Every day | ORAL | Status: DC
  Administered 2022-08-29 – 2022-09-11 (×14): 50 mg via ORAL
  Filled 2022-08-28 (×8): qty 1
  Filled 2022-08-28: qty 2
  Filled 2022-08-28 (×5): qty 1

## 2022-08-28 MED ORDER — ACETAMINOPHEN 325 MG PO TABS
650.0000 mg | ORAL_TABLET | Freq: Four times a day (QID) | ORAL | Status: DC | PRN
  Administered 2022-08-29 – 2022-09-11 (×16): 650 mg via ORAL
  Filled 2022-08-28 (×16): qty 2

## 2022-08-28 MED ORDER — LEVOTHYROXINE SODIUM 75 MCG PO TABS
75.0000 ug | ORAL_TABLET | Freq: Every day | ORAL | Status: DC
  Administered 2022-08-29 – 2022-09-11 (×13): 75 ug via ORAL
  Filled 2022-08-28 (×13): qty 1

## 2022-08-28 MED ORDER — LISINOPRIL 20 MG PO TABS
20.0000 mg | ORAL_TABLET | Freq: Every day | ORAL | Status: DC
  Administered 2022-08-29 – 2022-09-11 (×14): 20 mg via ORAL
  Filled 2022-08-28 (×14): qty 1

## 2022-08-28 MED ORDER — OXYCODONE HCL 5 MG PO TABS
5.0000 mg | ORAL_TABLET | ORAL | Status: DC | PRN
  Administered 2022-08-31 – 2022-09-10 (×3): 5 mg via ORAL
  Filled 2022-08-28 (×6): qty 1

## 2022-08-28 MED ORDER — ACETAMINOPHEN 500 MG PO TABS
1000.0000 mg | ORAL_TABLET | Freq: Once | ORAL | Status: AC
  Administered 2022-08-28: 1000 mg via ORAL
  Filled 2022-08-28: qty 2

## 2022-08-28 MED ORDER — ACETAMINOPHEN 650 MG RE SUPP: 650.0000 mg | Freq: Four times a day (QID) | RECTAL | Status: DC | PRN

## 2022-08-28 MED ORDER — ASPIRIN 81 MG PO TBEC
81.0000 mg | DELAYED_RELEASE_TABLET | Freq: Every day | ORAL | Status: DC
  Administered 2022-08-29 – 2022-09-11 (×13): 81 mg via ORAL
  Filled 2022-08-28 (×13): qty 1

## 2022-08-28 MED ORDER — ENOXAPARIN SODIUM 40 MG/0.4ML IJ SOSY
40.0000 mg | PREFILLED_SYRINGE | INTRAMUSCULAR | Status: DC
  Administered 2022-08-29 – 2022-09-11 (×13): 40 mg via SUBCUTANEOUS
  Filled 2022-08-28 (×13): qty 0.4

## 2022-08-28 MED ORDER — DULOXETINE HCL 60 MG PO CPEP
60.0000 mg | ORAL_CAPSULE | Freq: Every day | ORAL | Status: DC
  Administered 2022-08-29 – 2022-09-11 (×14): 60 mg via ORAL
  Filled 2022-08-28 (×12): qty 1
  Filled 2022-08-28: qty 2
  Filled 2022-08-28: qty 1

## 2022-08-28 MED ORDER — SODIUM CHLORIDE 0.9 % IV SOLN: INTRAVENOUS | Status: AC

## 2022-08-28 MED ORDER — SODIUM CHLORIDE 0.9 % IV BOLUS
1000.0000 mL | Freq: Once | INTRAVENOUS | Status: AC
  Administered 2022-08-28: 1000 mL via INTRAVENOUS

## 2022-08-28 NOTE — ED Provider Notes (Signed)
[facility] EMERGENCY DEPARTMENT Provider Note   CSN: [id number] Arrival date & time: 08/28/22  2022     History  Chief Complaint  Patient presents with   Weakness    BIB EMS for weakness from [facility], has had C-diff for the past 2 weeks, c/o not being able to eat or drink anything, dehydration and skin tenting with EMS, BP soft with EMS, NSR, A&Ox4     [name] is a 82 y.o. female presenting from [facility] rehab with concern for persistent diarrhea.  She reports recent diagnosis and treatment for C. difficile infection, completing 10 days of oral vancomycin 2 days ago.  She continues to have significant profuse watery diarrhea, particularly at night.  She feels that she is getting dehydrated.  She reports has been too weak to do any rehab and get out of bed.  Medical records show the patient was discharged from the hospital in December after admission for septic shock and concerns for bowel perforation, requiring ICU admission on Levophed and intubation.  She had an infectious colitis which was removed through emergency surgery at that time.  HPI     Home Medications Prior to Admission medications   Medication Sig Start Date End Date Taking? Authorizing Provider  acetaminophen (TYLENOL) 325 MG tablet Take 650 mg by mouth every 6 (six) hours as needed for mild pain or headache.   Yes [provider]  ALPRAZolam (XANAX) 0.5 MG tablet Take 1 tablet (0.5 mg total) by mouth 2 (two) times daily as needed. for anxiety 07/16/22  Yes Charlynne Cousins, MD  aspirin EC 81 MG tablet Take 1 tablet (81 mg total) by mouth daily. Swallow whole. Patient taking differently: Take 81 mg by mouth daily. 05/02/22  Yes Regalado, Belkys A, MD  buPROPion (WELLBUTRIN) 75 MG tablet Take 75 mg by mouth daily. 02/28/20  Yes [provider]  calcium-vitamin D (OSCAL WITH D) 500-200 MG-UNIT per tablet Take 1 tablet by mouth daily.   Yes [provider]  DULoxetine (CYMBALTA) 60 MG capsule Take 60 mg by mouth daily.   Yes [provider]  Glycerin-Hypromellose-PEG 400 (DRY EYE RELIEF DROPS OP) Place 1 drop into both eyes 3 (three) times daily as needed (for dry eyes).   Yes [provider]  levothyroxine (SYNTHROID) 75 MCG tablet Take 75 mcg by mouth daily before breakfast.   Yes [provider]  lidocaine (XYLOCAINE) 2 % solution Use as directed 15 mLs in the mouth or throat every 3 (three) hours as needed for mouth pain. 07/16/22  Yes Charlynne Cousins, MD  lisinopril (ZESTRIL) 20 MG tablet Take 1 tablet (20 mg total) by mouth daily. 07/30/22  Yes Charlynne Cousins, MD  methylcellulose (CITRUCEL) oral powder 2 tablespoons daily Patient taking differently: Take 1 packet by mouth daily. 03/08/21  Yes Irene Shipper, MD  metoprolol succinate (TOPROL-XL) 50 MG 24 hr tablet Take 1 tablet (50 mg total) by mouth daily. Take with or immediately following a meal. NEED ANNUAL VISIT FOR FURTHER REFILLS 04/29/17  Yes Hoyt Koch, MD  Multiple Vitamins-Minerals (MULTIVITAMIN WITH MINERALS) tablet Take 1 tablet by mouth daily.   Yes [provider]  omeprazole (PRILOSEC) 40 MG capsule Take 1 capsule (40 mg total) by mouth daily. NEED ANNUAL VISIT FOR FURTHER REFILLS Patient taking differently: Take 40 mg by mouth daily. 04/29/17  Yes Hoyt Koch, MD  ondansetron (ZOFRAN-ODT) 4 MG disintegrating tablet Take  4 mg by mouth every 4 (four) hours as needed for nausea or vomiting.   Yes [provider]  oxycodone (OXY-IR) 5 MG capsule Take 5 mg by mouth every 6 (six) hours as needed for pain.   Yes [provider]  OXYGEN Inhale 2 L into the lungs continuous.   Yes [provider]  rosuvastatin (CRESTOR) 10 MG tablet Take 1 tablet (10 mg total) by mouth daily. 05/01/22 05/01/23 Yes Regalado, Belkys A, MD  senna (SENOKOT) 8.6 MG tablet Take 8.6 mg by mouth daily.   Yes  [provider]  vancomycin (VANCOCIN) 125 MG capsule Take 125 mg by mouth 4 (four) times daily. For 10 days Patient not taking: Reported on 08/29/2022    [provider]      Allergies    Morphine and related    Review of Systems   Review of Systems  Physical Exam Updated Vital Signs BP (!) 107/55 (BP Location: Right Arm)   Pulse 77   Temp 97.8 F (36.6 C)   Resp 18   Ht '5\' 2"'$  (1.575 m)   Wt 60.3 kg   SpO2 95%   BMI 24.31 kg/m  Physical Exam Constitutional:      General: She is not in acute distress. HENT:     Head: Normocephalic and atraumatic.  Eyes:     Conjunctiva/sclera: Conjunctivae normal.     Pupils: Pupils are equal, round, and reactive to light.  Cardiovascular:     Rate and Rhythm: Normal rate and regular rhythm.  Pulmonary:     Effort: Pulmonary effort is normal. No respiratory distress.  Abdominal:     General: There is no distension.     Tenderness: There is no abdominal tenderness.  Skin:    General: Skin is warm and dry.  Neurological:     General: No focal deficit present.     Mental Status: She is alert. Mental status is at baseline.  Psychiatric:        Mood and Affect: Mood normal.        Behavior: Behavior normal.     ED Results / Procedures / Treatments   Labs (all labs ordered are listed, but only abnormal results are displayed) Labs Reviewed  COMPREHENSIVE METABOLIC PANEL - Abnormal; Notable for the following components:      Result Value   Sodium 129 (*)    CO2 19 (*)    Glucose, Bld 100 (*)    BUN 7 (*)    Calcium 7.6 (*)    Total Protein 4.3 (*)    Albumin 1.8 (*)    All other components within normal limits  CBC WITH DIFFERENTIAL/PLATELET - Abnormal; Notable for the following components:   RBC 3.10 (*)    Hemoglobin 9.1 (*)    HCT 30.6 (*)    MCHC 29.7 (*)    RDW 16.5 (*)    nRBC 0.3 (*)    Neutro Abs 1.6 (*)    All other components within normal limits  BASIC METABOLIC PANEL - Abnormal; Notable for  the following components:   Sodium 132 (*)    CO2 20 (*)    Glucose, Bld 107 (*)    BUN 5 (*)    Creatinine, Ser 0.43 (*)    Calcium 7.3 (*)    All other components within normal limits  MAGNESIUM - Abnormal; Notable for the following components:   Magnesium 1.6 (*)    All other components within normal limits  CBC -  Abnormal; Notable for the following components:   RBC 2.93 (*)    Hemoglobin 8.8 (*)    HCT 28.5 (*)    RDW 16.1 (*)    Platelets 403 (*)    All other components within normal limits  TSH - Abnormal; Notable for the following components:   TSH 4.935 (*)    All other components within normal limits  CBC - Abnormal; Notable for the following components:   RBC 3.20 (*)    Hemoglobin 9.6 (*)    HCT 30.2 (*)    RDW 16.2 (*)    Platelets 440 (*)    nRBC 0.3 (*)    All other components within normal limits  COMPREHENSIVE METABOLIC PANEL - Abnormal; Notable for the following components:   Sodium 133 (*)    Potassium 3.1 (*)    Glucose, Bld 119 (*)    BUN <5 (*)    Calcium 7.4 (*)    Total Protein 4.4 (*)    Albumin 1.7 (*)    All other components within normal limits  GASTROINTESTINAL PANEL BY PCR, STOOL (REPLACES STOOL CULTURE)  C DIFFICILE QUICK SCREEN W PCR REFLEX    MAGNESIUM  MAGNESIUM  SURGICAL PATHOLOGY    EKG None  Radiology DG Abd 1 View  Result Date: 08/29/2022 CLINICAL DATA:  Diarrhea and weakness. EXAM: ABDOMEN - 1 VIEW COMPARISON:  07/06/2022 FINDINGS: Gas visible in the nondistended stomach. Air is scattered through nondilated small bowel in the left abdomen with some air in stool visualized in the right colon. Bowel gas pattern is nonobstructive and no findings to suggest ileus. Curvilinear lucency overlies the left abdomen, indeterminate although pneumatosis would be a consideration. Fusion hardware noted lower lumbar spine. IMPRESSION: Nonobstructive bowel gas pattern. Linear lucency in the left abdomen is indeterminate. Pneumatosis cannot be  excluded. Correlate clinically and consider CT abdomen/pelvis to further evaluate as warranted. Electronically Signed   By: Misty Stanley M.D.   On: 08/29/2022 13:33    Procedures Procedures    Medications Ordered in ED Medications  aspirin EC tablet 81 mg ( Oral MAR Unhold 08/30/22 1225)  lisinopril (ZESTRIL) tablet 20 mg ( Oral MAR Unhold 08/30/22 1225)  metoprolol succinate (TOPROL-XL) 24 hr tablet 50 mg ( Oral MAR Unhold 08/30/22 1225)  rosuvastatin (CRESTOR) tablet 10 mg ( Oral MAR Unhold 08/30/22 1225)  ALPRAZolam (XANAX) tablet 0.5 mg ( Oral MAR Unhold 08/30/22 1225)  buPROPion (WELLBUTRIN) tablet 75 mg ( Oral MAR Unhold 08/30/22 1225)  DULoxetine (CYMBALTA) DR capsule 60 mg ( Oral MAR Unhold 08/30/22 1225)  levothyroxine (SYNTHROID) tablet 75 mcg ( Oral MAR Unhold 08/30/22 1225)  pantoprazole (PROTONIX) EC tablet 40 mg ( Oral MAR Unhold 08/30/22 1225)  enoxaparin (LOVENOX) injection 40 mg ( Subcutaneous MAR Unhold 08/30/22 1225)  acetaminophen (TYLENOL) tablet 650 mg ( Oral MAR Unhold 08/30/22 1225)    Or  acetaminophen (TYLENOL) suppository 650 mg ( Rectal MAR Unhold 08/30/22 1225)  0.9 %  sodium chloride infusion ( Intravenous New Bag/Given 08/28/22 2329)  oxyCODONE (Oxy IR/ROXICODONE) immediate release tablet 5 mg ( Oral MAR Unhold 08/30/22 1225)  methylPREDNISolone sodium succinate (SOLU-MEDROL) 125 mg/2 mL injection 60 mg (60 mg Intravenous Given 08/30/22 1341)  acetaminophen (TYLENOL) tablet 1,000 mg (1,000 mg Oral Given 08/28/22 2225)  sodium chloride 0.9 % bolus 1,000 mL (0 mLs Intravenous Stopped 08/28/22 2325)  magnesium sulfate IVPB 2 g 50 mL (0 g Intravenous Stopped 08/29/22 1137)  peg 3350 powder (MOVIPREP) kit 100 g (100 g  Oral Given 08/29/22 1722)    And  peg 3350 powder (MOVIPREP) kit 100 g (100 g Oral Given 08/29/22 2059)  metoCLOPramide (REGLAN) injection 10 mg (10 mg Intravenous Given 08/29/22 1721)  metoCLOPramide (REGLAN) injection 10 mg (10 mg Intravenous Given 08/29/22 2058)   potassium chloride SA (KLOR-CON M) CR tablet 40 mEq (40 mEq Oral Given 08/30/22 1621)    ED Course/ Medical Decision Making/ A&P Clinical Course as of 08/30/22 1657  Wed Aug 28, 2022  2258 Admitted to hospitalist dr opyd [MT]    Clinical Course User Index [MT] Wyvonnia Dusky, MD                             Medical Decision Making Amount and/or Complexity of Data Reviewed Labs: ordered.  Risk OTC drugs. Decision regarding hospitalization.   This patient presents to the ED with concern for persistent diarrhea, concern for ongoing C Diff colitis.   Patient was treated for C Diff appropriately with vancomycin x 10 days but I am concerned this may not have been effective with her degree of diarrhea, water loss, weight loss.  Alternatively may be malabsorptive diarrhea with alb 1.8, T pro 4.3  Labs reviewed - K 5.1, Alb low, wbc wnl  Doubt ongoing acute ischemic bowel - no significant abdominal pain or tenderness at this time.  I do not see indication for repeat abdominal CT imaging.  Additional history obtained from EMS  External records from outside source obtained and reviewed including hospital discharge summary from Dec 2023   I ordered medication including IV fluids , tylenol for headache   After the interventions noted above, I reevaluated the patient and found that they have: stayed the same   Dispostion:  After consideration of the diagnostic results and the patients response to treatment, I feel that the patent would benefit from medical admission.         Final Clinical Impression(s) / ED Diagnoses Final diagnoses:  C. difficile colitis  Diarrhea, unspecified type  Hypoalbuminemia    Rx / DC Orders ED Discharge Orders     None         Wyvonnia Dusky, MD 08/30/22 1657

## 2022-08-28 NOTE — H&P (Signed)
History and Physical    [name] QHU:[id number] DOB: 10-10-1940 DOA: 08/28/2022  PCP: Reynold Bowen, MD   Patient coming from: SNF   Chief Complaint: Diarrhea   HPI: [name] is a 82 y.o. female with medical history significant for hypertension, fibromyalgia, hypothyroidism, depression, and anxiety, presenting to the emergency department with diarrhea.  Patient reports 4-5 loose stools daily, and also notes that she has been passing large volume of liquid stool in her sleep.  That she was recently diagnosed with C. difficile colitis and given a 10-day course of oral vancomycin which she completed 2 days ago.  She continues to have watery diarrhea despite this and feels dehydrated and generally weak. She reports weight loss of at least 10 lbs.   She denies any abdominal pain, fever, or chills associated with this.    ED Course: Upon arrival to the ED, patient is found to be afebrile and saturating well on room air with normal heart rate and stable blood pressure.  Chemistry panel notable for sodium 129 and albumin 1.8.  CBC with hemoglobin 9.1.  C. difficile testing and GI pathogen panel were ordered by the ED physician and the patient was given a liter of normal saline and acetaminophen.  Review of Systems:  All other systems reviewed and apart from HPI, are negative.  Past Medical History:  Diagnosis Date   Cancer ([place]) 2014   kidney   Esophageal stricture    Fibromyalgia    GERD (gastroesophageal reflux disease)    Helicobacter pylori gastritis    Hypertension    Intention tremor    Lumbago    Memory loss    Osteoarthritis of spine    Pneumonia    hx of years ago    PUD (peptic ulcer disease)    PVD (peripheral vascular disease) ([place])     Past Surgical History:  Procedure Laterality Date   [place]   CATARACT EXTRACTION Bilateral 2014   [facility]   LAPAROSCOPIC PARTIAL NEPHRECTOMY Left April '13    [facility], Dr. Alinda Money   LAPAROTOMY N/A 07/05/2022   Procedure: EXPLORATORY LAPAROTOMY;  Surgeon: Ileana Roup, MD;  Location: [facility];  Service: General;  Laterality: N/A;   LUMBAR FUSION  03/2003   L4-5   [place]    Social History:   reports that she has never smoked. She has never used smokeless tobacco. She reports current alcohol use of about 7.0 standard drinks of alcohol per week. She reports that she does not use drugs.  Allergies  Allergen Reactions   Morphine And Related Itching and Rash    Given in IV.    Family History  Problem Relation Age of Onset   Heart disease Mother    COPD Mother    Thyroid disease Mother    Diabetes Father    Heart disease Father        CABG   Stroke Father    Parkinson's disease Father    Heart disease Brother    Stroke Brother    Hyperlipidemia Brother    Diabetes Daughter    Hearing loss Brother    Crohn's disease Other        neice   Hypertension Son    Diabetes Son    Melanoma Son    Colon cancer Neg Hx    Stomach cancer Neg Hx    Esophageal cancer Neg Hx  Prior to Admission medications   Medication Sig Start Date End Date Taking? Authorizing Provider  ALPRAZolam Duanne Moron) 0.5 MG tablet Take 1 tablet (0.5 mg total) by mouth 2 (two) times daily as needed. for anxiety 07/16/22   Charlynne Cousins, MD  Artificial Tear Ointment (DRY EYES OP) Apply 1 drop to eye 3 (three) times daily as needed (for dry eyes).    [provider]  aspirin EC 81 MG tablet Take 1 tablet (81 mg total) by mouth daily. Swallow whole. Patient taking differently: Take 81 mg by mouth daily. 05/02/22   Regalado, Belkys A, MD  buPROPion (WELLBUTRIN) 75 MG tablet Take 75 mg by mouth daily. 02/28/20   [provider]  calcium-vitamin D (OSCAL WITH D) 500-200 MG-UNIT per tablet Take 1 tablet by mouth daily.    [provider]  diphenoxylate-atropine (LOMOTIL) 2.5-0.025 MG/5ML liquid Take 5 mLs by  mouth 4 (four) times daily as needed for diarrhea or loose stools. 07/16/22   Charlynne Cousins, MD  DULoxetine (CYMBALTA) 60 MG capsule Take 60 mg by mouth daily.    [provider]  levothyroxine (SYNTHROID) 75 MCG tablet Take 75 mcg by mouth daily before breakfast.    [provider]  lidocaine (XYLOCAINE) 2 % solution Use as directed 15 mLs in the mouth or throat every 3 (three) hours as needed for mouth pain. 07/16/22   Charlynne Cousins, MD  lisinopril (ZESTRIL) 20 MG tablet Take 1 tablet (20 mg total) by mouth daily. 07/30/22   Charlynne Cousins, MD  methylcellulose (CITRUCEL) oral powder 2 tablespoons daily Patient taking differently: Take 1 packet by mouth daily. 03/08/21   Irene Shipper, MD  metoprolol succinate (TOPROL-XL) 50 MG 24 hr tablet Take 1 tablet (50 mg total) by mouth daily. Take with or immediately following a meal. NEED ANNUAL VISIT FOR FURTHER REFILLS 04/29/17   Hoyt Koch, MD  Multiple Vitamins-Minerals (MULTIVITAMIN WITH MINERALS) tablet Take 1 tablet by mouth daily.    [provider]  omeprazole (PRILOSEC) 40 MG capsule Take 1 capsule (40 mg total) by mouth daily. NEED ANNUAL VISIT FOR FURTHER REFILLS Patient taking differently: Take 40 mg by mouth daily. 04/29/17   Hoyt Koch, MD  oxycodone (OXY-IR) 5 MG capsule Take 5 mg by mouth every 4 (four) hours as needed.    [provider]  psyllium (HYDROCIL/METAMUCIL) 95 % PACK Take 1 packet by mouth 2 (two) times daily. 07/16/22   Charlynne Cousins, MD  rosuvastatin (CRESTOR) 10 MG tablet Take 1 tablet (10 mg total) by mouth daily. Patient not taking: Reported on 07/05/2022 05/01/22 05/01/23  Regalado, Jerald Kief A, MD  Sennosides (SENNA LAX PO) Take 1 tablet by mouth daily.    [provider]  Zinc Oxide (TRIPLE PASTE) 12.8 % ointment Apply topically as needed for irritation. 07/16/22   Charlynne Cousins, MD    Physical Exam: Vitals:   08/28/22 2045  08/28/22 2100 08/28/22 2200 08/28/22 2215  BP: 123/63 137/71 (!) 117/56 128/60  Pulse: 80   89  Resp: '18 18 19 19  '$ Temp:      TempSrc:      SpO2: 99%   97%  Weight:      Height:        Constitutional: NAD, calm  Eyes: PERTLA, lids and conjunctivae normal ENMT: Mucous membranes are moist. Posterior pharynx clear of any exudate or lesions.   Neck: supple, no masses  Respiratory: no wheezing, no crackles. No  accessory muscle use.  Cardiovascular: S1 & S2 heard, regular rate and rhythm. No extremity edema.  Abdomen: Soft, no distension, no tenderness. Bowel sounds active.  Musculoskeletal: no clubbing / cyanosis. No joint deformity upper and lower extremities.   Skin: no significant rashes, lesions, ulcers. Warm, dry, well-perfused. Neurologic: CN 2-12 grossly intact. Moving all extremities. Alert and oriented.  Psychiatric: Pleasant. Cooperative.    Labs and Imaging on Admission: I have personally reviewed following labs and imaging studies  CBC: Recent Labs  Lab 08/28/22 2042  WBC 6.6  NEUTROABS 1.6*  HGB 9.1*  HCT 30.6*  MCV 98.7  PLT 774   Basic Metabolic Panel: Recent Labs  Lab 08/28/22 2042  NA 129*  K 5.1  CL 99  CO2 19*  GLUCOSE 100*  BUN 7*  CREATININE 0.52  CALCIUM 7.6*  MG 1.8   GFR: Estimated Creatinine Clearance: 47.2 mL/min (by C-G formula based on SCr of 0.52 mg/dL). Liver Function Tests: Recent Labs  Lab 08/28/22 2042  AST 40  ALT 13  ALKPHOS 53  BILITOT 1.2  PROT 4.3*  ALBUMIN 1.8*   No results for input(s): "LIPASE", "AMYLASE" in the last 168 hours. No results for input(s): "AMMONIA" in the last 168 hours. Coagulation Profile: No results for input(s): "INR", "PROTIME" in the last 168 hours. Cardiac Enzymes: No results for input(s): "CKTOTAL", "CKMB", "CKMBINDEX", "TROPONINI" in the last 168 hours. BNP (last 3 results) No results for input(s): "PROBNP" in the last 8760 hours. HbA1C: No results for input(s): "HGBA1C" in the last 72  hours. CBG: No results for input(s): "GLUCAP" in the last 168 hours. Lipid Profile: No results for input(s): "CHOL", "HDL", "LDLCALC", "TRIG", "CHOLHDL", "LDLDIRECT" in the last 72 hours. Thyroid Function Tests: No results for input(s): "TSH", "T4TOTAL", "FREET4", "T3FREE", "THYROIDAB" in the last 72 hours. Anemia Panel: No results for input(s): "VITAMINB12", "FOLATE", "FERRITIN", "TIBC", "IRON", "RETICCTPCT" in the last 72 hours. Urine analysis:    Component Value Date/Time   COLORURINE AMBER (A) 07/06/2022 1641   APPEARANCEUR HAZY (A) 07/06/2022 1641   LABSPEC 1.020 07/06/2022 1641   PHURINE 5.0 07/06/2022 1641   GLUCOSEU NEGATIVE 07/06/2022 1641   GLUCOSEU NEGATIVE 05/06/2016 1708   HGBUR SMALL (A) 07/06/2022 1641   BILIRUBINUR NEGATIVE 07/06/2022 1641   BILIRUBINUR negative 06/17/2022 1429   KETONESUR NEGATIVE 07/06/2022 1641   PROTEINUR NEGATIVE 07/06/2022 1641   UROBILINOGEN 0.2 06/17/2022 1429   UROBILINOGEN 0.2 05/06/2016 1708   NITRITE NEGATIVE 07/06/2022 1641   LEUKOCYTESUR NEGATIVE 07/06/2022 1641   Sepsis Labs: '@LABRCNTIP'$ (procalcitonin:4,lacticidven:4) )No results found for this or any previous visit (from the past 240 hour(s)).   Radiological Exams on Admission: No results found.  Assessment/Plan   1. Diarrhea with dehydration  - Presents with persistent diarrhea despite completing 10-day course of oral vancomycin for C diff  - She is afebrile with no leukocytosis and abdominal exam is benign   - Continue IVF hydration, monitor electrolytes, follow up on results of stool studies    2. Hyponatremia  - Serum sodium is 129 in setting of hypovolemia  - Continue hydration with isotonic IVF and repeat chem panel in am    3. Hypertension  - Continue lisinopril and metoprolol as tolerated    4. Hx of TIA  - Continue ASA and statin    5. Depression, anxiety  - Continue Wellbutrin, Cymbalta, and as-needed Xanax     DVT prophylaxis: Lovenox  Code Status: DNR   Level of Care: Level of care: Med-Surg Family Communication:  none present  Disposition Plan:  Patient is from: SNF  Anticipated d/c is to: SNF  Anticipated d/c date is: Possibly as early as 1/18 or 08/30/22  Patient currently: Pending stable renal function and electrolytes, tolerance of adequate oral intake  Consults called: none  Admission status: Observation     Vianne Bulls, MD [facility]  08/28/2022, 11:03 PM

## 2022-08-29 ENCOUNTER — Observation Stay (HOSPITAL_COMMUNITY): Payer: Medicare Other

## 2022-08-29 DIAGNOSIS — E871 Hypo-osmolality and hyponatremia: Secondary | ICD-10-CM | POA: Diagnosis not present

## 2022-08-29 DIAGNOSIS — R197 Diarrhea, unspecified: Secondary | ICD-10-CM | POA: Diagnosis not present

## 2022-08-29 DIAGNOSIS — E8809 Other disorders of plasma-protein metabolism, not elsewhere classified: Secondary | ICD-10-CM | POA: Diagnosis not present

## 2022-08-29 LAB — BASIC METABOLIC PANEL
Anion gap: 7 (ref 5–15)
BUN: 5 mg/dL — ABNORMAL LOW (ref 8–23)
CO2: 20 mmol/L — ABNORMAL LOW (ref 22–32)
Calcium: 7.3 mg/dL — ABNORMAL LOW (ref 8.9–10.3)
Chloride: 105 mmol/L (ref 98–111)
Creatinine, Ser: 0.43 mg/dL — ABNORMAL LOW (ref 0.44–1.00)
GFR, Estimated: >60 mL/min (ref >60)
Glucose, Bld: 107 mg/dL — ABNORMAL HIGH (ref 70–99)
Potassium: 3.7 mmol/L (ref 3.5–5.1)
Sodium: 132 mmol/L — ABNORMAL LOW (ref 135–145)

## 2022-08-29 LAB — GASTROINTESTINAL PANEL BY PCR, STOOL (REPLACES STOOL CULTURE)

## 2022-08-29 LAB — CBC
HCT: 28.5 % — ABNORMAL LOW (ref 36.0–46.0)
Hemoglobin: 8.8 g/dL — ABNORMAL LOW (ref 12.0–15.0)
MCH: 30 pg (ref 26.0–34.0)
MCHC: 30.9 g/dL (ref 30.0–36.0)
MCV: 97.3 fL (ref 80.0–100.0)
Platelets: 403 10*3/uL — ABNORMAL HIGH (ref 150–400)
RBC: 2.93 MIL/uL — ABNORMAL LOW (ref 3.87–5.11)
RDW: 16.1 % — ABNORMAL HIGH (ref 11.5–15.5)
WBC: 5.7 10*3/uL (ref 4.0–10.5)
nRBC: 0 % (ref 0.0–0.2)

## 2022-08-29 LAB — TSH: TSH: 4.935 u[IU]/mL — ABNORMAL HIGH (ref 0.350–4.500)

## 2022-08-29 LAB — C DIFFICILE QUICK SCREEN W PCR REFLEX
C Diff antigen: NEGATIVE
C Diff interpretation: NOT DETECTED
C Diff toxin: NEGATIVE

## 2022-08-29 LAB — MAGNESIUM: Magnesium: 1.6 mg/dL — ABNORMAL LOW (ref 1.7–2.4)

## 2022-08-29 MED ORDER — PEG-KCL-NACL-NASULF-NA ASC-C 100 G PO SOLR
0.5000 | Freq: Once | ORAL | Status: AC
  Administered 2022-08-29: 100 g via ORAL
  Filled 2022-08-29: qty 1

## 2022-08-29 MED ORDER — PEG-KCL-NACL-NASULF-NA ASC-C 100 G PO SOLR: 1.0000 | Freq: Once | ORAL | Status: DC

## 2022-08-29 MED ORDER — SODIUM CHLORIDE 0.9 % IV SOLN: INTRAVENOUS | Status: DC

## 2022-08-29 MED ORDER — METOCLOPRAMIDE HCL 5 MG/ML IJ SOLN
10.0000 mg | Freq: Once | INTRAMUSCULAR | Status: AC
  Administered 2022-08-29: 10 mg via INTRAVENOUS
  Filled 2022-08-29: qty 2

## 2022-08-29 MED ORDER — MAGNESIUM SULFATE 2 GM/50ML IV SOLN
2.0000 g | Freq: Once | INTRAVENOUS | Status: AC
  Administered 2022-08-29: 2 g via INTRAVENOUS
  Filled 2022-08-29: qty 50

## 2022-08-29 NOTE — ED Notes (Signed)
Got patient some warm blankets patient is resting with call bell in reach

## 2022-08-29 NOTE — Consult Note (Signed)
Consultation Note   Referring Provider:  [facility] PCP: Kendra Bowen, MD Primary Gastroenterologist: Kendra Shorts, MD  Reason for consultation: diarrhea, weight loss  Hospital Day: 2  Patient Narrative:  [name] is a 82 y.o. female with a past medical history significant for  GERD / esophageal stricture / hiatal hernia, PUD, HTN, TIA,  OA, AV Mobitz type I block, hypothyroidism.  See PMH for any additional medical problems.  Assessment    82 yo female with diarrhea / weight loss with malnutrition. Recently hospitalized with septic shock / ischemic colitis. Exploratory laparotomy overall unrevealing. Now admitted with diarrhea and reportedly recent C-diff infection for which she took 10 days of Vancomycin without improvement. Stool studies this admission are negative. WBC normal. No significant abdominal pain / tenderness. Etiology of persistent diarrhea not yet clear.. TSH is okay. Weight loss is most likely multifactorial ( recent prolonged illness, diarrhea and difficulty chewing food after partial broken during intubation). Note that senokot on home med list marked as taking but seems unlikely rehab would be giving it to her with diarrhea  # Chamizal anemia in setting of recent prolonged illness. Baseline hgb 14 declining into 7 range during recent prolonged admission for sepsis / exp lap. Hgb currently stable at 8.8. No overt GI bleeding   # See PMH for additional medical problems  Plan   C-diff and stool path panel but will add fecal leukocytes Obtain KUB just to be sure this isn't overflow diarrhea as she has chronic constipation.  If KUB okay then will add imodium to give relief until this can be sorted out. Marland Kitchen    HPI   [name] was hospitalized for nearly 3 weeks in late November 2023 after a pre-syncopal event at home during defecation. CT showed severe dilation of colon with a large amount of stool and wall thickening of the  colon. She developed septic shock. Underwent exploratory laparotomy on 11/24 due to concern for perforated bowel.  No ischemic bowel found and no obvious source of sepsis   Exp lap findings:  Omental adhesion in the pelvis partially kinking the sigmoid colon but not obstructing it.  Severe constipation.  Red striations about her sigmoid which is otherwise completely normal in appearance and well-perfused. No stercoral perforation of her rectum or sigmoid colon.  Dilated but otherwise normal-appearing cecum and appendix.  No evidence of ischemia.  Normal small bowel.  Normal stomach.  Normal duodenal bulb.  Normal gallbladder.  Soft pancreas.  Hospitalization complicated by acute diastolic heart failure, malnutrition and anemia. Discharged on 07/16/22.   Interval History:  Patient presented to ED yesterday with persistent diarrhea. Apparently she was recently diagnosed and treated for C-diff with 10 days of Vancomycin. There is some confusion about whether she was truly positive. She had no improvement at all on Vancomycin.  She has been having several loose BMs a day, mainly after eating. At night she is incontinent of large loose stool. Problems started just as she got to [facility] after hospital discharge. She says she hasn't started any new meds lately. No significant abdominal pain. No blood in stool . She reports a recent 10 pound weight loss. After intubation she had some problems swallowing. Tells me her  /  partial.  In ED her sodium was low at 129, albumin 1.8, Hgb 9.1 ( stable).  K+ normal , Mg + low. Renal function normal. GI path panel and C-diff negative.    Previous GI Evaluation     Colonoscopy June 2017 Diverticulosis Fair prep  Recent Labs and Imaging No results found.  Labs:  Recent Labs    08/28/22 2042 08/29/22 0424  WBC 6.6 5.7  HGB 9.1* 8.8*  HCT 30.6* 28.5*  PLT 342 403*   Recent Labs    08/28/22 2042 08/29/22 0424  NA 129* 132*  K 5.1 3.7  CL 99 105  CO2  19* 20*  GLUCOSE 100* 107*  BUN 7* 5*  CREATININE 0.52 0.43*  CALCIUM 7.6* 7.3*   Recent Labs    08/28/22 2042  PROT 4.3*  ALBUMIN 1.8*  AST 40  ALT 13  ALKPHOS 53  BILITOT 1.2   No results for input(s): "HEPBSAG", "HCVAB", "HEPAIGM", "HEPBIGM" in the last 72 hours. No results for input(s): "LABPROT", "INR" in the last 72 hours.  Past Medical History:  Diagnosis Date   Cancer ([place]) 2014   kidney   Esophageal stricture    Fibromyalgia    GERD (gastroesophageal reflux disease)    Helicobacter pylori gastritis    Hypertension    Intention tremor    Lumbago    Memory loss    Osteoarthritis of spine    Pneumonia    hx of years ago    PUD (peptic ulcer disease)    PVD (peripheral vascular disease) ([place])     Past Surgical History:  Procedure Laterality Date   ABDOMINAL HYSTERECTOMY  1985   BREAST ENHANCEMENT SURGERY  1974   CATARACT EXTRACTION Bilateral 2014   shapiro   LAPAROSCOPIC PARTIAL NEPHRECTOMY Left April '13   [facility], Dr. Alinda [name]   LAPAROTOMY N/A 07/05/2022   Procedure: EXPLORATORY LAPAROTOMY;  Surgeon: Kendra Roup, MD;  Location: [place];  Service: General;  Laterality: N/A;   LUMBAR FUSION  03/2003   L4-5   TONSILLECTOMY  1958   TUBAL LIGATION  1984    Family History  Problem Relation Age of Onset   Heart disease Mother    COPD Mother    Thyroid disease Mother    Diabetes Father    Heart disease Father        CABG   Stroke Father    Parkinson's disease Father    Heart disease Brother    Stroke Brother    Hyperlipidemia Brother    Diabetes Daughter    Hearing loss Brother    Crohn's disease Other        neice   Hypertension Son    Diabetes Son    Melanoma Son    Colon cancer Neg Hx    Stomach cancer Neg Hx    Esophageal cancer Neg Hx     Prior to Admission medications   Medication Sig Start Date End Date Taking? Authorizing Provider  acetaminophen (TYLENOL) 325 MG tablet Take 650 mg by mouth every 6 (six) hours as needed for mild  pain or headache.   Yes [provider]  ALPRAZolam (XANAX) 0.5 MG tablet Take 1 tablet (0.5 mg total) by mouth 2 (two) times daily as needed. for anxiety 07/16/22  Yes Kendra Cousins, MD  aspirin EC 81 MG tablet Take 1 tablet (81 mg total) by mouth daily. Swallow whole. Patient taking differently: Take 81 mg by mouth daily. 05/02/22  Yes Regalado, Belkys A,  MD  buPROPion (WELLBUTRIN) 75 MG tablet Take 75 mg by mouth daily. 02/28/20  Yes [provider]  calcium-vitamin D (OSCAL WITH D) 500-200 MG-UNIT per tablet Take 1 tablet by mouth daily.   Yes [provider]  DULoxetine (CYMBALTA) 60 MG capsule Take 60 mg by mouth daily.   Yes [provider]  Glycerin-Hypromellose-PEG 400 (DRY EYE RELIEF DROPS OP) Place 1 drop into both eyes 3 (three) times daily as needed (for dry eyes).   Yes [provider]  levothyroxine (SYNTHROID) 75 MCG tablet Take 75 mcg by mouth daily before breakfast.   Yes [provider]  lidocaine (XYLOCAINE) 2 % solution Use as directed 15 mLs in the mouth or throat every 3 (three) hours as needed for mouth pain. 07/16/22  Yes Kendra Cousins, MD  lisinopril (ZESTRIL) 20 MG tablet Take 1 tablet (20 mg total) by mouth daily. 07/30/22  Yes Kendra Cousins, MD  methylcellulose (CITRUCEL) oral powder 2 tablespoons daily Patient taking differently: Take 1 packet by mouth daily. 03/08/21  Yes Irene Shipper, MD  metoprolol succinate (TOPROL-XL) 50 MG 24 hr tablet Take 1 tablet (50 mg total) by mouth daily. Take with or immediately following a meal. NEED ANNUAL VISIT FOR FURTHER REFILLS 04/29/17  Yes Hoyt Koch, MD  Multiple Vitamins-Minerals (MULTIVITAMIN WITH MINERALS) tablet Take 1 tablet by mouth daily.   Yes [provider]  omeprazole (PRILOSEC) 40 MG capsule Take 1 capsule (40 mg total) by mouth daily. NEED ANNUAL VISIT FOR FURTHER REFILLS Patient taking differently: Take 40 mg by mouth daily.  04/29/17  Yes Hoyt Koch, MD  ondansetron (ZOFRAN-ODT) 4 MG disintegrating tablet Take 4 mg by mouth every 4 (four) hours as needed for nausea or vomiting.   Yes [provider]  oxycodone (OXY-IR) 5 MG capsule Take 5 mg by mouth every 6 (six) hours as needed for pain.   Yes [provider]  OXYGEN Inhale 2 L into the lungs continuous.   Yes [provider]  rosuvastatin (CRESTOR) 10 MG tablet Take 1 tablet (10 mg total) by mouth daily. 05/01/22 05/01/23 Yes Regalado, Belkys A, MD  senna (SENOKOT) 8.6 MG tablet Take 8.6 mg by mouth daily.   Yes [provider]  vancomycin (VANCOCIN) 125 MG capsule Take 125 mg by mouth 4 (four) times daily. For 10 days Patient not taking: Reported on 08/29/2022    [provider]    Current Facility-Administered Medications  Medication Dose Route Frequency Provider Last Rate Last Admin   0.9 %  sodium chloride infusion   Intravenous Continuous Opyd, Ilene Qua, MD 125 mL/hr at 08/28/22 2329 New Bag at 08/28/22 2329   acetaminophen (TYLENOL) tablet 650 mg  650 mg Oral Q6H PRN Opyd, Ilene Qua, MD       Or   acetaminophen (TYLENOL) suppository 650 mg  650 mg Rectal Q6H PRN Opyd, Ilene Qua, MD       ALPRAZolam Duanne Moron) tablet 0.5 mg  0.5 mg Oral BID PRN Opyd, Ilene Qua, MD       aspirin EC tablet 81 mg  81 mg Oral Daily Opyd, Ilene Qua, MD   81 mg at 08/29/22 0944   buPROPion [facility]) tablet 75 mg  75 mg Oral Daily Opyd, Ilene Qua, MD   75 mg at 08/29/22 1137   DULoxetine (CYMBALTA) DR capsule 60 mg  60 mg Oral Daily Opyd, Ilene Qua, MD   60 mg at 08/29/22 0944   enoxaparin (  LOVENOX) injection 40 mg  40 mg Subcutaneous Q24H Opyd, Ilene Qua, MD   40 mg at 08/29/22 0944   levothyroxine (SYNTHROID) tablet 75 mcg  75 mcg Oral QAC breakfast Opyd, Ilene Qua, MD   75 mcg at 08/29/22 0805   lisinopril (ZESTRIL) tablet 20 mg  20 mg Oral Daily Opyd, Ilene Qua, MD   20 mg at 08/29/22 1137   metoprolol succinate (TOPROL-XL)  24 hr tablet 50 mg  50 mg Oral Daily Opyd, Ilene Qua, MD   50 mg at 08/29/22 1137   oxyCODONE (Oxy IR/ROXICODONE) immediate release tablet 5 mg  5 mg Oral Q4H PRN Opyd, Ilene Qua, MD       pantoprazole (PROTONIX) EC tablet 40 mg  40 mg Oral Daily Opyd, Ilene Qua, MD   40 mg at 08/29/22 0944   rosuvastatin (CRESTOR) tablet 10 mg  10 mg Oral Daily Opyd, Ilene Qua, MD   10 mg at 08/29/22 0945   Current Outpatient Medications  Medication Sig Dispense Refill   acetaminophen (TYLENOL) 325 MG tablet Take 650 mg by mouth every 6 (six) hours as needed for mild pain or headache.     ALPRAZolam (XANAX) 0.5 MG tablet Take 1 tablet (0.5 mg total) by mouth 2 (two) times daily as needed. for anxiety 5 tablet 1   aspirin EC 81 MG tablet Take 1 tablet (81 mg total) by mouth daily. Swallow whole. (Patient taking differently: Take 81 mg by mouth daily.) 30 tablet 12   buPROPion (WELLBUTRIN) 75 MG tablet Take 75 mg by mouth daily.     calcium-vitamin D (OSCAL WITH D) 500-200 MG-UNIT per tablet Take 1 tablet by mouth daily.     DULoxetine (CYMBALTA) 60 MG capsule Take 60 mg by mouth daily.     Glycerin-Hypromellose-PEG 400 (DRY EYE RELIEF DROPS OP) Place 1 drop into both eyes 3 (three) times daily as needed (for dry eyes).     levothyroxine (SYNTHROID) 75 MCG tablet Take 75 mcg by mouth daily before breakfast.     lidocaine (XYLOCAINE) 2 % solution Use as directed 15 mLs in the mouth or throat every 3 (three) hours as needed for mouth pain. 90 mL 0   lisinopril (ZESTRIL) 20 MG tablet Take 1 tablet (20 mg total) by mouth daily.  3   methylcellulose (CITRUCEL) oral powder 2 tablespoons daily (Patient taking differently: Take 1 packet by mouth daily.)     metoprolol succinate (TOPROL-XL) 50 MG 24 hr tablet Take 1 tablet (50 mg total) by mouth daily. Take with or immediately following a meal. NEED ANNUAL VISIT FOR FURTHER REFILLS 90 tablet 0   Multiple Vitamins-Minerals (MULTIVITAMIN WITH MINERALS) tablet Take 1 tablet by  mouth daily.     omeprazole (PRILOSEC) 40 MG capsule Take 1 capsule (40 mg total) by mouth daily. NEED ANNUAL VISIT FOR FURTHER REFILLS (Patient taking differently: Take 40 mg by mouth daily.) 90 capsule 0   ondansetron (ZOFRAN-ODT) 4 MG disintegrating tablet Take 4 mg by mouth every 4 (four) hours as needed for nausea or vomiting.     oxycodone (OXY-IR) 5 MG capsule Take 5 mg by mouth every 6 (six) hours as needed for pain.     OXYGEN Inhale 2 L into the lungs continuous.     rosuvastatin (CRESTOR) 10 MG tablet Take 1 tablet (10 mg total) by mouth daily. 30 tablet 11   senna (SENOKOT) 8.6 MG tablet Take 8.6 mg by mouth daily.     vancomycin (VANCOCIN) 125 MG  capsule Take 125 mg by mouth 4 (four) times daily. 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  Tobacco Use   Smoking status: Never   Smokeless tobacco: Never  Vaping Use   Vaping Use: Never used  Substance and Sexual Activity   Alcohol use: Yes    Alcohol/week: 7.0 standard drinks of alcohol    Types: 7 Glasses of wine per week    Comment: occasional glass of wine with dinner    Drug use: No   Sexual activity: Not Currently  Other Topics Concern   Not on file  Social History Narrative   Lives alone   Right Handed   Drinks 2 cups coffee daily   Social Determinants of Health   Financial Resource Strain: Not on file  Food Insecurity: Not on file  Transportation Needs: Not on file  Physical Activity: Not on file  Stress: Not on file  Social Connections: Not on file  Intimate Partner  Violence: Not on file    Review of Systems: All systems reviewed and negative except where noted in HPI.  Physical Exam: Vital signs in last 24 hours: Temp:  [97.8 F (36.6 C)-98.2 F (36.8 C)] 97.9 F (36.6 C) (01/18 0811) Pulse Rate:  [69-89] 86 (01/18 0945) Resp:  [16-22] 17 (01/18 0945) BP: (94-137)/(48-80) 94/78 (01/18 0945) SpO2:  [90 %-100 %] 99 % (01/18 0945) Weight:  [60.3 kg] 60.3 kg (01/17 2028)    General:  Alert female in NAD Psych:  Pleasant, cooperative. Normal mood and affect Eyes: Pupils equal Ears:  Normal auditory acuity Nose: No deformity, discharge or lesions Neck:  Supple, no masses felt Lungs:  Clear to auscultation.  Heart:  Regular rate, regular rhythm.  Abdomen:  Soft, nondistended, nontender, active bowel sounds, no masses felt Rectal :  No fecal impaction. Scant amount of unformed brown stool in vault.  Msk: Symmetrical without gross deformities.  Neurologic:  Alert, oriented, grossly normal neurologically Extremities : No edema Skin:  Intact without significant lesions.    Intake/Output from previous day: 01/17 0701 - 01/18 0700 In: 716 [IV Piggyback:716] Out: -  Intake/Output this shift:  No intake/output data recorded.    Principal Problem:   Diarrhea with dehydration Active Problems:   Fibromyalgia   Essential hypertension   Hyponatremia   Anxiety with depression   TIA (transient ischemic attack)   Malnutrition of moderate degree    Tye Savoy, NP-C @  08/29/2022, 11:44 AM

## 2022-08-29 NOTE — ED Notes (Signed)
Pt in room A&O x4 requesting someone check her med list. Pharm tech notified. Pt adjusted in bed. Updated on plan of care. Denies any needs at this time.

## 2022-08-29 NOTE — ED Notes (Signed)
Cleaned patient up placed new sheets and pads put a new brief patient is sitting up eating breakfast with call bell in reach

## 2022-08-29 NOTE — Progress Notes (Signed)
PROGRESS NOTE  [name] HWE:[id number] DOB: [birth year] DOA: 08/28/2022 PCP: Reynold Bowen, MD   LOS: 0 days   Brief Narrative / Interim history: 82 year old female with history of HTN, hypothyroidism, depression/anxiety who comes to the hospital with diarrhea.  She was recently hospitalized at the end of 2023 with abdominal pain, septic shock, elevated lactic acid.  She was felt to have large intestinal ischemia and was taken to OR for an ex lap by Dr. Dema Severin, without any evidence of malperfusion of any of her intestine.  Was felt to have some sort of infectious colitis.  She was discharged to [place], but in the last 2 to 3 weeks she has developed significant watery diarrhea.  She was treated for C. difficile colitis with p.o. vancomycin QID, and apparently finished antibiotics about 4 days prior to this admission.  She is not sure whether C. difficile was ever sent or she was treated empirically.  Despite treatment, she has been having persistent diarrhea with 5 watery bowel movements per day, and in the last week she has also noticed nighttime stool incontinence while asleep and wakes up soiled.  This is never happened to her, and prior to her surgery she has been actually constipated for most of her life.  She denies any blood in her stools, she does report occasional cramping with nighttime bowel movements.  She reports progressive weakness, failure to progress with PT, weight loss  Subjective / 24h Interval events: Still complains of watery diarrhea, complains of generalized weakness.  Assesement and Plan: Principal Problem:   Diarrhea with dehydration Active Problems:   Fibromyalgia   Essential hypertension   Hyponatremia   Anxiety with depression   TIA (transient ischemic attack)   Malnutrition of moderate degree   Principal problem Nonspecified diarrheal illness -patient with progressive diarrhea, 10 pound weight loss and progressive weakness mostly over the last several  weeks.  This has persisted despite treating for C. difficile.  I do not see any positive C. difficile in our system or Care Everywhere -C. difficile testing now is negative, no need for p.o. vancomycin -GI pathogen panel pending -Gastroenterology consulted this morning, appreciate input. ?  Repeat colonoscopy versus CT scan of the abdomen and pelvis, defer next step to GI team  Active problems Hyponatremia -due to significant dehydration, sodium improving, continue fluids for now  Essential hypertension-continue home medications as below  Hypothyroidism-continue Synthroid, recheck a TSH given significant diarrhea  Hypomagnesemia-replete magnesium  Anemia, of chronic disease-no bleeding, monitor  History of TIA-continue aspirin, statin  Depression/anxiety -continue home medications as below  Status post ex lap December 2023 - noted  Scheduled Meds:  aspirin EC  81 mg Oral Daily   buPROPion  75 mg Oral Daily   DULoxetine  60 mg Oral Daily   enoxaparin (LOVENOX) injection  40 mg Subcutaneous Q24H   levothyroxine  75 mcg Oral QAC breakfast   lisinopril  20 mg Oral Daily   metoprolol succinate  50 mg Oral Daily   pantoprazole  40 mg Oral Daily   rosuvastatin  10 mg Oral Daily   Continuous Infusions:  sodium chloride 125 mL/hr at 08/28/22 2329   PRN Meds:.acetaminophen **OR** acetaminophen, ALPRAZolam, oxyCODONE  Current Outpatient Medications  Medication Instructions   acetaminophen (TYLENOL) 650 mg, Oral, Every 6 hours PRN   ALPRAZolam (XANAX) 0.5 mg, Oral, 2 times daily PRN, for anxiety   aspirin EC 81 mg, Oral, Daily, Swallow whole.   buPROPion (WELLBUTRIN) 75 mg, Oral, Daily  calcium-vitamin D (OSCAL WITH D) 500-200 MG-UNIT per tablet 1 tablet, Oral, Daily   DULoxetine (CYMBALTA) 60 mg, Oral, Daily   Glycerin-Hypromellose-PEG 400 (DRY EYE RELIEF DROPS OP) 1 drop, Both Eyes, 3 times daily PRN   levothyroxine (SYNTHROID) 75 mcg, Oral, Daily before breakfast   lidocaine  (XYLOCAINE) 2 % solution 15 mLs, Mouth/Throat, Every  3 hours PRN   lisinopril (ZESTRIL) 20 mg, Oral, Daily   methylcellulose (CITRUCEL) oral powder 2 tablespoons daily   metoprolol succinate (TOPROL-XL) 50 mg, Oral, Daily, Take with or immediately following a meal. NEED ANNUAL VISIT FOR FURTHER REFILLS   Multiple Vitamins-Minerals (MULTIVITAMIN WITH MINERALS) tablet 1 tablet, Oral, Daily   omeprazole (PRILOSEC) 40 mg, Oral, Daily, NEED ANNUAL VISIT FOR FURTHER REFILLS   ondansetron (ZOFRAN-ODT) 4 mg, Oral, Every 4 hours PRN   oxycodone (OXY-IR) 5 mg, Oral, Every 6 hours PRN   OXYGEN 2 L, Inhalation, Continuous   rosuvastatin (CRESTOR) 10 mg, Oral, Daily   senna (SENOKOT) 8.6 mg, Oral, Daily   vancomycin (VANCOCIN) 125 mg, 4 times daily    Diet Orders (From admission, onward)     Start     Ordered   08/28/22 2303  Diet regular Room service appropriate? Yes; Fluid consistency: Thin  Diet effective now       Question Answer Comment  Room service appropriate? Yes   Fluid consistency: Thin      08/28/22 2303            DVT prophylaxis: enoxaparin (LOVENOX) injection 40 mg Start: 08/29/22 0800   Lab Results  Component Value Date   PLT 403 (H) 08/29/2022      Code Status: DNR  Family Communication: no family at bedside   Status is: Observation The patient will require care spanning > 2 midnights and should be moved to inpatient because: Persistent weakness, diarrhea, hyponatremia   Level of care: Med-Surg  Consultants:  GI  Objective: Vitals:   08/29/22 0545 08/29/22 0600 08/29/22 0617 08/29/22 0811  BP: 107/65 121/66    Pulse: 79 76    Resp: (!) 21 19    Temp:   98.2 F (36.8 C) 97.9 F (36.6 C)  TempSrc:   Oral Oral  SpO2: 96% 94%    Weight:      Height:        Intake/Output Summary (Last 24 hours) at 08/29/2022 7026 Last data filed at 08/28/2022 2325 Gross per 24 hour  Intake 715.95 ml  Output --  Net 715.95 ml   Wt Readings from Last 3 Encounters:   08/28/22 60.3 kg  08/08/22 62.6 kg  07/16/22 75.4 kg    Examination:  Constitutional: NAD Eyes: no scleral icterus ENMT: Mucous membranes are moist.  Neck: normal, supple Respiratory: clear to auscultation bilaterally, no wheezing, no crackles. Normal respiratory effort. No accessory muscle use.  Cardiovascular: Regular rate and rhythm, no murmurs / rubs / gallops. No LE edema.  Abdomen: non distended, no tenderness. Bowel sounds positive.  Musculoskeletal: no clubbing / cyanosis.   Data Reviewed: I have independently reviewed following labs and imaging studies   CBC Recent Labs  Lab 08/28/22 2042 08/29/22 0424  WBC 6.6 5.7  HGB 9.1* 8.8*  HCT 30.6* 28.5*  PLT 342 403*  MCV 98.7 97.3  MCH 29.4 30.0  MCHC 29.7* 30.9  RDW 16.5* 16.1*  LYMPHSABS 4.0  --   MONOABS 0.8  --   EOSABS 0.1  --   BASOSABS 0.0  --  Recent Labs  Lab 08/28/22 2042 08/29/22 0424  NA 129* 132*  K 5.1 3.7  CL 99 105  CO2 19* 20*  GLUCOSE 100* 107*  BUN 7* 5*  CREATININE 0.52 0.43*  CALCIUM 7.6* 7.3*  AST 40  --   ALT 13  --   ALKPHOS 53  --   BILITOT 1.2  --   ALBUMIN 1.8*  --   MG 1.8 1.6*    ------------------------------------------------------------------------------------------------------------------ No results for input(s): "CHOL", "HDL", "LDLCALC", "TRIG", "CHOLHDL", "LDLDIRECT" in the last 72 hours.  Lab Results  Component Value Date   HGBA1C 5.7 (H) 04/30/2022   ------------------------------------------------------------------------------------------------------------------ No results for input(s): "TSH", "T4TOTAL", "T3FREE", "THYROIDAB" in the last 72 hours.  Invalid input(s): "FREET3"  Cardiac Enzymes No results for input(s): "CKMB", "TROPONINI", "MYOGLOBIN" in the last 168 hours.  Invalid input(s): "CK" ------------------------------------------------------------------------------------------------------------------ No results found for: "BNP"  CBG: No  results for input(s): "GLUCAP" in the last 168 hours.  Recent Results (from the past 240 hour(s))  C Difficile Quick Screen w PCR reflex     Status: None   Collection Time: 08/29/22  2:57 AM   Specimen: Stool  Result Value Ref Range Status   C Diff antigen NEGATIVE NEGATIVE Final   C Diff toxin NEGATIVE NEGATIVE Final   C Diff interpretation No C. difficile detected.  Final    Comment: Performed at [facility], [facility] [address], [place], [place] [address]     Radiology Studies: No results found.   Marzetta Board, MD, PhD [facility]  Between 7 am - 7 pm I am available, please contact me via Amion (for emergencies) or Securechat (non urgent messages)  Between 7 pm - 7 am I am not available, please contact night coverage MD/APP via Amion

## 2022-08-29 NOTE — H&P (View-Only) (Signed)
Consultation Note   Referring Provider:  [facility] PCP: Reynold Bowen, MD Primary Gastroenterologist: Scarlette Shorts, MD  Reason for consultation: diarrhea, weight loss  Hospital Day: 2  Patient Narrative:  [name] is a 82 y.o. female with a past medical history significant for  GERD / esophageal stricture / hiatal hernia, PUD, HTN, TIA,  OA, AV Mobitz type I block, hypothyroidism.  See PMH for any additional medical problems.  Assessment    82 yo female with diarrhea / weight loss with malnutrition. Recently hospitalized with septic shock / ischemic colitis. Exploratory laparotomy overall unrevealing. Now admitted with diarrhea and reportedly recent C-diff infection for which she took 10 days of Vancomycin without improvement. Stool studies this admission are negative. WBC normal. No significant abdominal pain / tenderness. Etiology of persistent diarrhea not yet clear.. TSH is okay. Weight loss is most likely multifactorial ( recent prolonged illness, diarrhea and difficulty chewing food after partial broken during intubation). Note that senokot on home med list marked as taking but seems unlikely rehab would be giving it to her with diarrhea  # [place] anemia in setting of recent prolonged illness. Baseline hgb 14 declining into 7 range during recent prolonged admission for sepsis / exp lap. Hgb currently stable at 8.8. No overt GI bleeding   # See PMH for additional medical problems  Plan   C-diff and stool path panel but will add fecal leukocytes Obtain KUB just to be sure this isn't overflow diarrhea as she has chronic constipation.  If KUB okay then will add imodium to give relief until this can be sorted out. Marland Kitchen    HPI   [name] was hospitalized for nearly 3 weeks in late November 2023 after a pre-syncopal event at home during defecation. CT showed severe dilation of colon with a large amount of stool and wall thickening of the  colon. She developed septic shock. Underwent exploratory laparotomy on 11/24 due to concern for perforated bowel.  No ischemic bowel found and no obvious source of sepsis   Exp lap findings:  Omental adhesion in the pelvis partially kinking the sigmoid colon but not obstructing it.  Severe constipation.  Red striations about her sigmoid which is otherwise completely normal in appearance and well-perfused. No stercoral perforation of her rectum or sigmoid colon.  Dilated but otherwise normal-appearing cecum and appendix.  No evidence of ischemia.  Normal small bowel.  Normal stomach.  Normal duodenal bulb.  Normal gallbladder.  Soft pancreas.  Hospitalization complicated by acute diastolic heart failure, malnutrition and anemia. Discharged on 07/16/22.   Interval History:  Patient presented to ED yesterday with persistent diarrhea. Apparently she was recently diagnosed and treated for C-diff with 10 days of Vancomycin. There is some confusion about whether she was truly positive. She had no improvement at all on Vancomycin.  She has been having several loose BMs a day, mainly after eating. At night she is incontinent of large loose stool. Problems started just as she got to [facility] after hospital discharge. She says she hasn't started any new meds lately. No significant abdominal pain. No blood in stool . She reports a recent 10 pound weight loss. After intubation she had some problems swallowing. Tells me her  /  partial.  In ED her sodium was low at 129, albumin 1.8, Hgb 9.1 ( stable).  K+ normal , Mg + low. Renal function normal. GI path panel and C-diff negative.    Previous GI Evaluation     Colonoscopy June 2017 Diverticulosis Fair prep  Recent Labs and Imaging No results found.  Labs:  Recent Labs    08/28/22 2042 08/29/22 0424  WBC 6.6 5.7  HGB 9.1* 8.8*  HCT 30.6* 28.5*  PLT 342 403*   Recent Labs    08/28/22 2042 08/29/22 0424  NA 129* 132*  K 5.1 3.7  CL 99 105  CO2  19* 20*  GLUCOSE 100* 107*  BUN 7* 5*  CREATININE 0.52 0.43*  CALCIUM 7.6* 7.3*   Recent Labs    08/28/22 2042  PROT 4.3*  ALBUMIN 1.8*  AST 40  ALT 13  ALKPHOS 53  BILITOT 1.2   No results for input(s): "HEPBSAG", "HCVAB", "HEPAIGM", "HEPBIGM" in the last 72 hours. No results for input(s): "LABPROT", "INR" in the last 72 hours.  Past Medical History:  Diagnosis Date   Cancer ([place]) 2014   kidney   Esophageal stricture    Fibromyalgia    GERD (gastroesophageal reflux disease)    Helicobacter pylori gastritis    Hypertension    Intention tremor    Lumbago    Memory loss    Osteoarthritis of spine    Pneumonia    hx of years ago    PUD (peptic ulcer disease)    PVD (peripheral vascular disease) ([place])     Past Surgical History:  Procedure Laterality Date   ABDOMINAL HYSTERECTOMY  1985   BREAST ENHANCEMENT SURGERY  1974   CATARACT EXTRACTION Bilateral 2014   shapiro   LAPAROSCOPIC PARTIAL NEPHRECTOMY Left April '13   [facility], Dr. Alinda Money   LAPAROTOMY N/A 07/05/2022   Procedure: EXPLORATORY LAPAROTOMY;  Surgeon: Ileana Roup, MD;  Location: [facility];  Service: General;  Laterality: N/A;   LUMBAR FUSION  03/2003   L4-5   TONSILLECTOMY  1958   TUBAL LIGATION  1984    Family History  Problem Relation Age of Onset   Heart disease Mother    COPD Mother    Thyroid disease Mother    Diabetes Father    Heart disease Father        CABG   Stroke Father    Parkinson's disease Father    Heart disease Brother    Stroke Brother    Hyperlipidemia Brother    Diabetes Daughter    Hearing loss Brother    Crohn's disease Other        neice   Hypertension Son    Diabetes Son    Melanoma Son    Colon cancer Neg Hx    Stomach cancer Neg Hx    Esophageal cancer Neg Hx     Prior to Admission medications   Medication Sig Start Date End Date Taking? Authorizing Provider  acetaminophen (TYLENOL) 325 MG tablet Take 650 mg by mouth every 6 (six) hours as needed for mild  pain or headache.   Yes [provider]  ALPRAZolam (XANAX) 0.5 MG tablet Take 1 tablet (0.5 mg total) by mouth 2 (two) times daily as needed. for anxiety 07/16/22  Yes Charlynne Cousins, MD  aspirin EC 81 MG tablet Take 1 tablet (81 mg total) by mouth daily. Swallow whole. Patient taking differently: Take 81 mg by mouth daily. 05/02/22  Yes Regalado, Belkys A,  MD  buPROPion (WELLBUTRIN) 75 MG tablet Take 75 mg by mouth daily. 02/28/20  Yes [provider]  calcium-vitamin D (OSCAL WITH D) 500-200 MG-UNIT per tablet Take 1 tablet by mouth daily.   Yes [provider]  DULoxetine (CYMBALTA) 60 MG capsule Take 60 mg by mouth daily.   Yes [provider]  Glycerin-Hypromellose-PEG 400 (DRY EYE RELIEF DROPS OP) Place 1 drop into both eyes 3 (three) times daily as needed (for dry eyes).   Yes [provider]  levothyroxine (SYNTHROID) 75 MCG tablet Take 75 mcg by mouth daily before breakfast.   Yes [provider]  lidocaine (XYLOCAINE) 2 % solution Use as directed 15 mLs in the mouth or throat every 3 (three) hours as needed for mouth pain. 07/16/22  Yes Charlynne Cousins, MD  lisinopril (ZESTRIL) 20 MG tablet Take 1 tablet (20 mg total) by mouth daily. 07/30/22  Yes Charlynne Cousins, MD  methylcellulose (CITRUCEL) oral powder 2 tablespoons daily Patient taking differently: Take 1 packet by mouth daily. 03/08/21  Yes Irene Shipper, MD  metoprolol succinate (TOPROL-XL) 50 MG 24 hr tablet Take 1 tablet (50 mg total) by mouth daily. Take with or immediately following a meal. NEED ANNUAL VISIT FOR FURTHER REFILLS 04/29/17  Yes Hoyt Koch, MD  Multiple Vitamins-Minerals (MULTIVITAMIN WITH MINERALS) tablet Take 1 tablet by mouth daily.   Yes [provider]  omeprazole (PRILOSEC) 40 MG capsule Take 1 capsule (40 mg total) by mouth daily. NEED ANNUAL VISIT FOR FURTHER REFILLS Patient taking differently: Take 40 mg by mouth daily.  04/29/17  Yes Hoyt Koch, MD  ondansetron (ZOFRAN-ODT) 4 MG disintegrating tablet Take 4 mg by mouth every 4 (four) hours as needed for nausea or vomiting.   Yes [provider]  oxycodone (OXY-IR) 5 MG capsule Take 5 mg by mouth every 6 (six) hours as needed for pain.   Yes [provider]  OXYGEN Inhale 2 L into the lungs continuous.   Yes [provider]  rosuvastatin (CRESTOR) 10 MG tablet Take 1 tablet (10 mg total) by mouth daily. 05/01/22 05/01/23 Yes Regalado, Belkys A, MD  senna (SENOKOT) 8.6 MG tablet Take 8.6 mg by mouth daily.   Yes [provider]  vancomycin (VANCOCIN) 125 MG capsule Take 125 mg by mouth 4 (four) times daily. For 10 days Patient not taking: Reported on 08/29/2022    [provider]    Current Facility-Administered Medications  Medication Dose Route Frequency Provider Last Rate Last Admin   0.9 %  sodium chloride infusion   Intravenous Continuous Opyd, Ilene Qua, MD 125 mL/hr at 08/28/22 2329 New Bag at 08/28/22 2329   acetaminophen (TYLENOL) tablet 650 mg  650 mg Oral Q6H PRN Opyd, Ilene Qua, MD       Or   acetaminophen (TYLENOL) suppository 650 mg  650 mg Rectal Q6H PRN Opyd, Ilene Qua, MD       ALPRAZolam Duanne Moron) tablet 0.5 mg  0.5 mg Oral BID PRN Opyd, Ilene Qua, MD       aspirin EC tablet 81 mg  81 mg Oral Daily Opyd, Ilene Qua, MD   81 mg at 08/29/22 0944   buPROPion [facility]) tablet 75 mg  75 mg Oral Daily Opyd, Ilene Qua, MD   75 mg at 08/29/22 1137   DULoxetine (CYMBALTA) DR capsule 60 mg  60 mg Oral Daily Opyd, Ilene Qua, MD   60 mg at 08/29/22 0944   enoxaparin (  LOVENOX) injection 40 mg  40 mg Subcutaneous Q24H Opyd, Ilene Qua, MD   40 mg at 08/29/22 0944   levothyroxine (SYNTHROID) tablet 75 mcg  75 mcg Oral QAC breakfast Opyd, Ilene Qua, MD   75 mcg at 08/29/22 0805   lisinopril (ZESTRIL) tablet 20 mg  20 mg Oral Daily Opyd, Ilene Qua, MD   20 mg at 08/29/22 1137   metoprolol succinate (TOPROL-XL)  24 hr tablet 50 mg  50 mg Oral Daily Opyd, Ilene Qua, MD   50 mg at 08/29/22 1137   oxyCODONE (Oxy IR/ROXICODONE) immediate release tablet 5 mg  5 mg Oral Q4H PRN Opyd, Ilene Qua, MD       pantoprazole (PROTONIX) EC tablet 40 mg  40 mg Oral Daily Opyd, Ilene Qua, MD   40 mg at 08/29/22 0944   rosuvastatin (CRESTOR) tablet 10 mg  10 mg Oral Daily Opyd, Ilene Qua, MD   10 mg at 08/29/22 0945   Current Outpatient Medications  Medication Sig Dispense Refill   acetaminophen (TYLENOL) 325 MG tablet Take 650 mg by mouth every 6 (six) hours as needed for mild pain or headache.     ALPRAZolam (XANAX) 0.5 MG tablet Take 1 tablet (0.5 mg total) by mouth 2 (two) times daily as needed. for anxiety 5 tablet 1   aspirin EC 81 MG tablet Take 1 tablet (81 mg total) by mouth daily. Swallow whole. (Patient taking differently: Take 81 mg by mouth daily.) 30 tablet 12   buPROPion (WELLBUTRIN) 75 MG tablet Take 75 mg by mouth daily.     calcium-vitamin D (OSCAL WITH D) 500-200 MG-UNIT per tablet Take 1 tablet by mouth daily.     DULoxetine (CYMBALTA) 60 MG capsule Take 60 mg by mouth daily.     Glycerin-Hypromellose-PEG 400 (DRY EYE RELIEF DROPS OP) Place 1 drop into both eyes 3 (three) times daily as needed (for dry eyes).     levothyroxine (SYNTHROID) 75 MCG tablet Take 75 mcg by mouth daily before breakfast.     lidocaine (XYLOCAINE) 2 % solution Use as directed 15 mLs in the mouth or throat every 3 (three) hours as needed for mouth pain. 90 mL 0   lisinopril (ZESTRIL) 20 MG tablet Take 1 tablet (20 mg total) by mouth daily.  3   methylcellulose (CITRUCEL) oral powder 2 tablespoons daily (Patient taking differently: Take 1 packet by mouth daily.)     metoprolol succinate (TOPROL-XL) 50 MG 24 hr tablet Take 1 tablet (50 mg total) by mouth daily. Take with or immediately following a meal. NEED ANNUAL VISIT FOR FURTHER REFILLS 90 tablet 0   Multiple Vitamins-Minerals (MULTIVITAMIN WITH MINERALS) tablet Take 1 tablet by  mouth daily.     omeprazole (PRILOSEC) 40 MG capsule Take 1 capsule (40 mg total) by mouth daily. NEED ANNUAL VISIT FOR FURTHER REFILLS (Patient taking differently: Take 40 mg by mouth daily.) 90 capsule 0   ondansetron (ZOFRAN-ODT) 4 MG disintegrating tablet Take 4 mg by mouth every 4 (four) hours as needed for nausea or vomiting.     oxycodone (OXY-IR) 5 MG capsule Take 5 mg by mouth every 6 (six) hours as needed for pain.     OXYGEN Inhale 2 L into the lungs continuous.     rosuvastatin (CRESTOR) 10 MG tablet Take 1 tablet (10 mg total) by mouth daily. 30 tablet 11   senna (SENOKOT) 8.6 MG tablet Take 8.6 mg by mouth daily.     vancomycin (VANCOCIN) 125 MG  capsule Take 125 mg by mouth 4 (four) times daily. 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  Tobacco Use   Smoking status: Never   Smokeless tobacco: Never  Vaping Use   Vaping Use: Never used  Substance and Sexual Activity   Alcohol use: Yes    Alcohol/week: 7.0 standard drinks of alcohol    Types: 7 Glasses of wine per week    Comment: occasional glass of wine with dinner    Drug use: No   Sexual activity: Not Currently  Other Topics Concern   Not on file  Social History Narrative   Lives alone   Right Handed   Drinks 2 cups coffee daily   Social Determinants of Health   Financial Resource Strain: Not on file  Food Insecurity: Not on file  Transportation Needs: Not on file  Physical Activity: Not on file  Stress: Not on file  Social Connections: Not on file  Intimate Partner  Violence: Not on file    Review of Systems: All systems reviewed and negative except where noted in HPI.  Physical Exam: Vital signs in last 24 hours: Temp:  [97.8 F (36.6 C)-98.2 F (36.8 C)] 97.9 F (36.6 C) (01/18 0811) Pulse Rate:  [69-89] 86 (01/18 0945) Resp:  [16-22] 17 (01/18 0945) BP: (94-137)/(48-80) 94/78 (01/18 0945) SpO2:  [90 %-100 %] 99 % (01/18 0945) Weight:  [60.3 kg] 60.3 kg (01/17 2028)    General:  Alert female in NAD Psych:  Pleasant, cooperative. Normal mood and affect Eyes: Pupils equal Ears:  Normal auditory acuity Nose: No deformity, discharge or lesions Neck:  Supple, no masses felt Lungs:  Clear to auscultation.  Heart:  Regular rate, regular rhythm.  Abdomen:  Soft, nondistended, nontender, active bowel sounds, no masses felt Rectal :  No fecal impaction. Scant amount of unformed brown stool in vault.  Msk: Symmetrical without gross deformities.  Neurologic:  Alert, oriented, grossly normal neurologically Extremities : No edema Skin:  Intact without significant lesions.    Intake/Output from previous day: 01/17 0701 - 01/18 0700 In: 716 [IV Piggyback:716] Out: -  Intake/Output this shift:  No intake/output data recorded.    Principal Problem:   Diarrhea with dehydration Active Problems:   Fibromyalgia   Essential hypertension   Hyponatremia   Anxiety with depression   TIA (transient ischemic attack)   Malnutrition of moderate degree    Tye Savoy, NP-C @  08/29/2022, 11:44 AM

## 2022-08-29 NOTE — ED Notes (Signed)
Placed patient on a bedpan bed got patient off placed another brief patient is sitting with her breakfast with call bell in reach

## 2022-08-29 NOTE — ED Notes (Signed)
ED TO INPATIENT HANDOFF REPORT  ED Nurse Name and Phone #: Mayrene Bastarache [phone]  S Name/Age/Gender [name] 82 y.o. female Room/Bed: 011C/011C  Code Status   Code Status: DNR  Home/SNF/Other Home Patient oriented to: self, place, time, and situation Is this baseline? Yes   Triage Complete: Triage complete  Chief Complaint Diarrhea with dehydration [R19.7]  Triage Note No notes on file   Allergies Allergies  Allergen Reactions   Morphine And Related Itching and Rash    Given in IV.    Level of Care/Admitting Diagnosis ED Disposition     ED Disposition  Admit   Condition  --   [place] [[id number]]  Level of Care: Med-Surg [16]  May place patient in observation at [facility] or [place] if equivalent level of care is available:: Yes  Covid Evaluation: Asymptomatic - no recent exposure (last 10 days) testing not required  Diagnosis: Diarrhea with dehydration [967893]  Admitting Physician: Vianne Bulls [[id number]]  Attending Physician: Vianne Bulls [[id number]]          B Medical/Surgery History Past Medical History:  Diagnosis Date   Cancer ([place]) 2014   kidney   Esophageal stricture    Fibromyalgia    GERD (gastroesophageal reflux disease)    Helicobacter pylori gastritis    Hypertension    Intention tremor    Lumbago    Memory loss    Osteoarthritis of spine    Pneumonia    hx of years ago    PUD (peptic ulcer disease)    PVD (peripheral vascular disease) ([place])    Past Surgical History:  Procedure Laterality Date   [place]   CATARACT EXTRACTION Bilateral 2014   [facility]   LAPAROSCOPIC PARTIAL NEPHRECTOMY Left April '13   [place], Dr. Alinda Money   LAPAROTOMY N/A 07/05/2022   Procedure: EXPLORATORY LAPAROTOMY;  Surgeon: Ileana Roup, MD;  Location: [facility] OR;  Service: General;  Laterality: N/A;   LUMBAR FUSION  03/2003   L4-5   [place]     A IV Location/Drains/Wounds Patient Lines/Drains/Airways Status     Active Line/Drains/Airways     Name Placement date Placement time Site Days   Peripheral IV 08/28/22 18 G Anterior;Left;Proximal Forearm 08/28/22  2046  Forearm  1   External Urinary Catheter 07/11/22  2230  --  49   Incision (Closed) 07/05/22 Abdomen Other (Comment) 07/05/22  1846  -- 55            Intake/Output Last 24 hours  Intake/Output Summary (Last 24 hours) at 08/29/2022 1233 Last data filed at 08/28/2022 2325 Gross per 24 hour  Intake 715.95 ml  Output --  Net 715.95 ml    Labs/Imaging Results for orders placed or performed during the hospital encounter of 08/28/22 (from the past 48 hour(s))  Comprehensive metabolic panel     Status: Abnormal   Collection Time: 08/28/22  8:42 PM  Result Value Ref Range   Sodium 129 (L) 135 - 145 mmol/L   Potassium 5.1 3.5 - 5.1 mmol/L    Comment: HEMOLYSIS AT THIS LEVEL MAY AFFECT RESULT   Chloride 99 98 - 111 mmol/L   CO2 19 (L) 22 - 32 mmol/L   Glucose, Bld 100 (H) 70 - 99 mg/dL    Comment: Glucose reference range applies only to samples taken after fasting for at  least 8 hours.   BUN 7 (L) 8 - 23 mg/dL   Creatinine, Ser 0.52 0.44 - 1.00 mg/dL   Calcium 7.6 (L) 8.9 - 10.3 mg/dL   Total Protein 4.3 (L) 6.5 - 8.1 g/dL   Albumin 1.8 (L) 3.5 - 5.0 g/dL   AST 40 15 - 41 U/L    Comment: HEMOLYSIS AT THIS LEVEL MAY AFFECT RESULT   ALT 13 0 - 44 U/L    Comment: HEMOLYSIS AT THIS LEVEL MAY AFFECT RESULT   Alkaline Phosphatase 53 38 - 126 U/L    Comment: HEMOLYSIS AT THIS LEVEL MAY AFFECT RESULT   Total Bilirubin 1.2 0.3 - 1.2 mg/dL    Comment: HEMOLYSIS AT THIS LEVEL MAY AFFECT RESULT   GFR, Estimated >60 >60 mL/min    Comment: (NOTE) Calculated using the CKD-EPI Creatinine Equation (2021)    Anion gap 11 5 - 15    Comment: Performed at [facility], [place] [address], [place], [place] [address]  CBC with Differential      Status: Abnormal   Collection Time: 08/28/22  8:42 PM  Result Value Ref Range   WBC 6.6 4.0 - 10.5 K/uL   RBC 3.10 (L) 3.87 - 5.11 MIL/uL   Hemoglobin 9.1 (L) 12.0 - 15.0 g/dL   HCT 30.6 (L) 36.0 - 46.0 %   MCV 98.7 80.0 - 100.0 fL   MCH 29.4 26.0 - 34.0 pg   MCHC 29.7 (L) 30.0 - 36.0 g/dL   RDW 16.5 (H) 11.5 - 15.5 %   Platelets 342 150 - 400 K/uL   nRBC 0.3 (H) 0.0 - 0.2 %   Neutrophils Relative % 25 %   Neutro Abs 1.6 (L) 1.7 - 7.7 K/uL   Lymphocytes Relative 60 %   Lymphs Abs 4.0 0.7 - 4.0 K/uL   Monocytes Relative 12 %   Monocytes Absolute 0.8 0.1 - 1.0 K/uL   Eosinophils Relative 2 %   Eosinophils Absolute 0.1 0.0 - 0.5 K/uL   Basophils Relative 1 %   Basophils Absolute 0.0 0.0 - 0.1 K/uL   Immature Granulocytes 0 %   Abs Immature Granulocytes 0.02 0.00 - 0.07 K/uL    Comment: Performed at [facility], [place] [address] [place], [place] [address]  Magnesium     Status: None   Collection Time: 08/28/22  8:42 PM  Result Value Ref Range   Magnesium 1.8 1.7 - 2.4 mg/dL    Comment: HEMOLYSIS AT THIS LEVEL MAY AFFECT RESULT Performed at [facility], [place] [address], [place],  [address]   Gastrointestinal Panel by PCR , Stool     Status: None   Collection Time: 08/29/22  2:57 AM   Specimen: Stool  Result Value Ref Range   Campylobacter species NOT DETECTED NOT DETECTED   Plesimonas shigelloides NOT DETECTED NOT DETECTED   Salmonella species NOT DETECTED NOT DETECTED   Yersinia enterocolitica NOT DETECTED NOT DETECTED   Vibrio species NOT DETECTED NOT DETECTED   Vibrio cholerae NOT DETECTED NOT DETECTED   Enteroaggregative E coli (EAEC) NOT DETECTED NOT DETECTED   Enteropathogenic E coli (EPEC) NOT DETECTED NOT DETECTED   Enterotoxigenic E coli (ETEC) NOT DETECTED NOT DETECTED   Shiga like toxin producing E coli (STEC) NOT DETECTED NOT DETECTED   Shigella/Enteroinvasive E coli (EIEC) NOT DETECTED NOT DETECTED   Cryptosporidium NOT DETECTED NOT DETECTED    Cyclospora cayetanensis NOT DETECTED NOT DETECTED   Entamoeba histolytica NOT DETECTED NOT  DETECTED   Giardia lamblia NOT DETECTED NOT DETECTED   Adenovirus F40/41 NOT DETECTED NOT DETECTED   Astrovirus NOT DETECTED NOT DETECTED   Norovirus GI/GII NOT DETECTED NOT DETECTED   Rotavirus A NOT DETECTED NOT DETECTED   Sapovirus (I, II, IV, and V) NOT DETECTED NOT DETECTED    Comment: Performed at [facility], [address] [place], [place] [address]  C Difficile Quick Screen w PCR reflex     Status: None   Collection Time: 08/29/22  2:57 AM   Specimen: Stool  Result Value Ref Range   C Diff antigen NEGATIVE NEGATIVE   C Diff toxin NEGATIVE NEGATIVE   C Diff interpretation No C. difficile detected.     Comment: Performed at [facility], [place] [address] [place], [place] [address]  Basic metabolic panel     Status: Abnormal   Collection Time: 08/29/22  4:24 AM  Result Value Ref Range   Sodium 132 (L) 135 - 145 mmol/L   Potassium 3.7 3.5 - 5.1 mmol/L   Chloride 105 98 - 111 mmol/L   CO2 20 (L) 22 - 32 mmol/L   Glucose, Bld 107 (H) 70 - 99 mg/dL    Comment: Glucose reference range applies only to samples taken after fasting for at least 8 hours.   BUN 5 (L) 8 - 23 mg/dL   Creatinine, Ser 0.43 (L) 0.44 - 1.00 mg/dL   Calcium 7.3 (L) 8.9 - 10.3 mg/dL   GFR, Estimated >60 >60 mL/min    Comment: (NOTE) Calculated using the CKD-EPI Creatinine Equation (2021)    Anion gap 7 5 - 15    Comment: Performed at [facility] [address], [place], [place] [address]  Magnesium     Status: Abnormal   Collection Time: 08/29/22  4:24 AM  Result Value Ref Range   Magnesium 1.6 (L) 1.7 - 2.4 mg/dL    Comment: Performed at [facility] [address], [place], [place] [address]  CBC     Status: Abnormal   Collection Time: 08/29/22  4:24 AM  Result Value Ref Range   WBC 5.7 4.0 - 10.5 K/uL   RBC 2.93 (L) 3.87 - 5.11 MIL/uL   Hemoglobin 8.8 (L) 12.0 - 15.0 g/dL    HCT 28.5 (L) 36.0 - 46.0 %   MCV 97.3 80.0 - 100.0 fL   MCH 30.0 26.0 - 34.0 pg   MCHC 30.9 30.0 - 36.0 g/dL   RDW 16.1 (H) 11.5 - 15.5 %   Platelets 403 (H) 150 - 400 K/uL   nRBC 0.0 0.0 - 0.2 %    Comment: Performed at [facility] [address], [place], [place] [address]  TSH     Status: Abnormal   Collection Time: 08/29/22  4:24 AM  Result Value Ref Range   TSH 4.935 (H) 0.350 - 4.500 uIU/mL    Comment: Performed by a 3rd Generation assay with a functional sensitivity of <=0.01 uIU/mL. Performed at [facility], [place] [address], [place],  [address]    No results found.  Pending Labs Unresulted Labs (From admission, onward)     Start     Ordered   09/04/22 0500  Creatinine, serum  (enoxaparin (LOVENOX)    CrCl >/= 30 ml/min)  Weekly,   R     Comments: while on enoxaparin therapy    08/28/22 2303   08/30/22 0500  Comprehensive metabolic panel  Tomorrow  morning,   R        08/29/22 0952   08/30/22 0500  CBC  Tomorrow morning,   R        08/29/22 0952   08/30/22 0500  Magnesium  Tomorrow morning,   R        08/29/22 0952   08/29/22 0034  Basic metabolic panel  Daily,   R      08/28/22 2303   08/29/22 0500  CBC  Daily,   R      08/28/22 2303            Vitals/Pain Today's Vitals   08/29/22 0617 08/29/22 0811 08/29/22 0945 08/29/22 1200  BP:   94/78 134/74  Pulse:   86 89  Resp:   17 (!) 23  Temp: 98.2 F (36.8 C) 97.9 F (36.6 C)  98 F (36.7 C)  TempSrc: Oral Oral  Oral  SpO2:   99% 98%  Weight:      Height:      PainSc:        Isolation Precautions Enteric precautions (UV disinfection)  Medications Medications  aspirin EC tablet 81 mg (81 mg Oral Given 08/29/22 0944)  lisinopril (ZESTRIL) tablet 20 mg (20 mg Oral Given 08/29/22 1137)  metoprolol succinate (TOPROL-XL) 24 hr tablet 50 mg (50 mg Oral Given 08/29/22 1137)  rosuvastatin (CRESTOR) tablet 10 mg (10 mg Oral Given 08/29/22 0945)  ALPRAZolam (XANAX) tablet 0.5 mg (has no  administration in time range)  buPROPion (WELLBUTRIN) tablet 75 mg (75 mg Oral Given 08/29/22 1137)  DULoxetine (CYMBALTA) DR capsule 60 mg (60 mg Oral Given 08/29/22 0944)  levothyroxine (SYNTHROID) tablet 75 mcg (75 mcg Oral Given 08/29/22 0805)  pantoprazole (PROTONIX) EC tablet 40 mg (40 mg Oral Given 08/29/22 0944)  enoxaparin (LOVENOX) injection 40 mg (40 mg Subcutaneous Given 08/29/22 0944)  acetaminophen (TYLENOL) tablet 650 mg (has no administration in time range)    Or  acetaminophen (TYLENOL) suppository 650 mg (has no administration in time range)  0.9 %  sodium chloride infusion ( Intravenous New Bag/Given 08/28/22 2329)  oxyCODONE (Oxy IR/ROXICODONE) immediate release tablet 5 mg (has no administration in time range)  acetaminophen (TYLENOL) tablet 1,000 mg (1,000 mg Oral Given 08/28/22 2225)  sodium chloride 0.9 % bolus 1,000 mL (0 mLs Intravenous Stopped 08/28/22 2325)  magnesium sulfate IVPB 2 g 50 mL (0 g Intravenous Stopped 08/29/22 1137)    Mobility non-ambulatory     Focused Assessments    R Recommendations: See Admitting Provider Note  Report given to:   Additional Notes:

## 2022-08-30 ENCOUNTER — Encounter (HOSPITAL_COMMUNITY): Payer: Self-pay | Admitting: Family Medicine

## 2022-08-30 ENCOUNTER — Encounter (HOSPITAL_COMMUNITY)
Admission: EM | Disposition: A | Discharge status: Skilled Nursing Facility | Payer: Self-pay | Source: Skilled Nursing Facility | Attending: Internal Medicine

## 2022-08-30 ENCOUNTER — Observation Stay (HOSPITAL_COMMUNITY): Payer: Medicare Other | Admitting: Critical Care Medicine

## 2022-08-30 DIAGNOSIS — I10 Essential (primary) hypertension: Secondary | ICD-10-CM

## 2022-08-30 DIAGNOSIS — K529 Noninfective gastroenteritis and colitis, unspecified: Secondary | ICD-10-CM | POA: Diagnosis not present

## 2022-08-30 DIAGNOSIS — F419 Anxiety disorder, unspecified: Secondary | ICD-10-CM | POA: Diagnosis present

## 2022-08-30 DIAGNOSIS — E860 Dehydration: Secondary | ICD-10-CM | POA: Diagnosis present

## 2022-08-30 DIAGNOSIS — Z66 Do not resuscitate: Secondary | ICD-10-CM | POA: Diagnosis present

## 2022-08-30 DIAGNOSIS — K5010 Crohn's disease of large intestine without complications: Secondary | ICD-10-CM | POA: Diagnosis present

## 2022-08-30 DIAGNOSIS — Z79890 Hormone replacement therapy (postmenopausal): Secondary | ICD-10-CM | POA: Diagnosis not present

## 2022-08-30 DIAGNOSIS — E43 Unspecified severe protein-calorie malnutrition: Secondary | ICD-10-CM | POA: Diagnosis present

## 2022-08-30 DIAGNOSIS — F32A Depression, unspecified: Secondary | ICD-10-CM | POA: Diagnosis present

## 2022-08-30 DIAGNOSIS — M797 Fibromyalgia: Secondary | ICD-10-CM | POA: Diagnosis present

## 2022-08-30 DIAGNOSIS — K50118 Crohn's disease of large intestine with other complication: Secondary | ICD-10-CM | POA: Diagnosis not present

## 2022-08-30 DIAGNOSIS — E039 Hypothyroidism, unspecified: Secondary | ICD-10-CM | POA: Diagnosis present

## 2022-08-30 DIAGNOSIS — D638 Anemia in other chronic diseases classified elsewhere: Secondary | ICD-10-CM | POA: Diagnosis present

## 2022-08-30 DIAGNOSIS — I739 Peripheral vascular disease, unspecified: Secondary | ICD-10-CM | POA: Diagnosis present

## 2022-08-30 DIAGNOSIS — E8809 Other disorders of plasma-protein metabolism, not elsewhere classified: Secondary | ICD-10-CM | POA: Diagnosis present

## 2022-08-30 DIAGNOSIS — E876 Hypokalemia: Secondary | ICD-10-CM | POA: Diagnosis not present

## 2022-08-30 DIAGNOSIS — E871 Hypo-osmolality and hyponatremia: Secondary | ICD-10-CM | POA: Diagnosis present

## 2022-08-30 DIAGNOSIS — F418 Other specified anxiety disorders: Secondary | ICD-10-CM | POA: Diagnosis not present

## 2022-08-30 DIAGNOSIS — K648 Other hemorrhoids: Secondary | ICD-10-CM | POA: Diagnosis not present

## 2022-08-30 DIAGNOSIS — Z8249 Family history of ischemic heart disease and other diseases of the circulatory system: Secondary | ICD-10-CM | POA: Diagnosis not present

## 2022-08-30 DIAGNOSIS — Z9841 Cataract extraction status, right eye: Secondary | ICD-10-CM | POA: Diagnosis not present

## 2022-08-30 DIAGNOSIS — A0472 Enterocolitis due to Clostridium difficile, not specified as recurrent: Secondary | ICD-10-CM | POA: Diagnosis not present

## 2022-08-30 DIAGNOSIS — K633 Ulcer of intestine: Secondary | ICD-10-CM | POA: Diagnosis present

## 2022-08-30 DIAGNOSIS — K219 Gastro-esophageal reflux disease without esophagitis: Secondary | ICD-10-CM | POA: Diagnosis present

## 2022-08-30 DIAGNOSIS — Z825 Family history of asthma and other chronic lower respiratory diseases: Secondary | ICD-10-CM | POA: Diagnosis not present

## 2022-08-30 DIAGNOSIS — Z905 Acquired absence of kidney: Secondary | ICD-10-CM | POA: Diagnosis not present

## 2022-08-30 DIAGNOSIS — Z79899 Other long term (current) drug therapy: Secondary | ICD-10-CM | POA: Diagnosis not present

## 2022-08-30 DIAGNOSIS — Z9842 Cataract extraction status, left eye: Secondary | ICD-10-CM | POA: Diagnosis not present

## 2022-08-30 DIAGNOSIS — R197 Diarrhea, unspecified: Secondary | ICD-10-CM | POA: Diagnosis not present

## 2022-08-30 HISTORY — PX: BIOPSY: SHX5522

## 2022-08-30 HISTORY — PX: COLONOSCOPY WITH PROPOFOL: SHX5780

## 2022-08-30 LAB — CBC
HCT: 30.2 % — ABNORMAL LOW (ref 36.0–46.0)
Hemoglobin: 9.6 g/dL — ABNORMAL LOW (ref 12.0–15.0)
MCH: 30 pg (ref 26.0–34.0)
MCHC: 31.8 g/dL (ref 30.0–36.0)
MCV: 94.4 fL (ref 80.0–100.0)
Platelets: 440 10*3/uL — ABNORMAL HIGH (ref 150–400)
RBC: 3.2 MIL/uL — ABNORMAL LOW (ref 3.87–5.11)
RDW: 16.2 % — ABNORMAL HIGH (ref 11.5–15.5)
WBC: 5.9 10*3/uL (ref 4.0–10.5)
nRBC: 0.3 % — ABNORMAL HIGH (ref 0.0–0.2)

## 2022-08-30 LAB — COMPREHENSIVE METABOLIC PANEL
ALT: 15 U/L (ref 0–44)
AST: 18 U/L (ref 15–41)
Albumin: 1.7 g/dL — ABNORMAL LOW (ref 3.5–5.0)
Alkaline Phosphatase: 52 U/L (ref 38–126)
Anion gap: 6 (ref 5–15)
BUN: <5 mg/dL — ABNORMAL LOW (ref 8–23)
CO2: 22 mmol/L (ref 22–32)
Calcium: 7.4 mg/dL — ABNORMAL LOW (ref 8.9–10.3)
Chloride: 105 mmol/L (ref 98–111)
Creatinine, Ser: 0.52 mg/dL (ref 0.44–1.00)
GFR, Estimated: >60 mL/min (ref >60)
Glucose, Bld: 119 mg/dL — ABNORMAL HIGH (ref 70–99)
Potassium: 3.1 mmol/L — ABNORMAL LOW (ref 3.5–5.1)
Sodium: 133 mmol/L — ABNORMAL LOW (ref 135–145)
Total Bilirubin: 0.5 mg/dL (ref 0.3–1.2)
Total Protein: 4.4 g/dL — ABNORMAL LOW (ref 6.5–8.1)

## 2022-08-30 LAB — MAGNESIUM: Magnesium: 1.9 mg/dL (ref 1.7–2.4)

## 2022-08-30 SURGERY — COLONOSCOPY WITH PROPOFOL
Anesthesia: Monitor Anesthesia Care

## 2022-08-30 MED ORDER — LACTATED RINGERS IV SOLN: INTRAVENOUS | Status: DC

## 2022-08-30 MED ORDER — BANATROL TF EN LIQD
60.0000 mL | Freq: Two times a day (BID) | ENTERAL | Status: DC
  Administered 2022-08-30 – 2022-09-11 (×24): 60 mL via ORAL
  Filled 2022-08-30 (×25): qty 60

## 2022-08-30 MED ORDER — PROPOFOL 500 MG/50ML IV EMUL
INTRAVENOUS | Status: DC | PRN
  Administered 2022-08-30: 125 ug/kg/min via INTRAVENOUS

## 2022-08-30 MED ORDER — PROSOURCE PLUS PO LIQD
30.0000 mL | Freq: Two times a day (BID) | ORAL | Status: DC
  Administered 2022-08-31 – 2022-09-11 (×22): 30 mL via ORAL
  Filled 2022-08-30 (×22): qty 30

## 2022-08-30 MED ORDER — METHYLPREDNISOLONE SODIUM SUCC 125 MG IJ SOLR
60.0000 mg | Freq: Every day | INTRAMUSCULAR | Status: DC
  Administered 2022-08-30 – 2022-09-02 (×4): 60 mg via INTRAVENOUS
  Filled 2022-08-30 (×6): qty 2

## 2022-08-30 MED ORDER — ADULT MULTIVITAMIN W/MINERALS CH
1.0000 | ORAL_TABLET | Freq: Every day | ORAL | Status: DC
  Administered 2022-08-30 – 2022-09-11 (×13): 1 via ORAL
  Filled 2022-08-30 (×13): qty 1

## 2022-08-30 MED ORDER — PROPOFOL 10 MG/ML IV BOLUS
INTRAVENOUS | Status: DC | PRN
  Administered 2022-08-30 (×2): 10 mg via INTRAVENOUS
  Administered 2022-08-30: 20 mg via INTRAVENOUS
  Administered 2022-08-30: 10 mg via INTRAVENOUS

## 2022-08-30 MED ORDER — POTASSIUM CHLORIDE CRYS ER 20 MEQ PO TBCR
40.0000 meq | EXTENDED_RELEASE_TABLET | ORAL | Status: AC
  Administered 2022-08-30 (×2): 40 meq via ORAL
  Filled 2022-08-30 (×4): qty 2

## 2022-08-30 SURGICAL SUPPLY — 22 items

## 2022-08-30 NOTE — Progress Notes (Signed)
Initial Nutrition Assessment  DOCUMENTATION CODES:   Not applicable  INTERVENTION:  - Add MVI q day   - Add Prosource Plus BID.   - Add Banatrol BID.   NUTRITION DIAGNOSIS:   Severe Malnutrition related to acute illness as evidenced by energy intake < 75% for > 7 days, percent weight loss, mild fat depletion, mild muscle depletion.  GOAL:   Patient will meet greater than or equal to 90% of their needs  MONITOR:   PO intake, Supplement acceptance  REASON FOR ASSESSMENT:   Consult Assessment of nutrition requirement/status  ASSESSMENT:   82 y.o. female admits related to diarrhea from SNF. PMH includes: cancer, esophageal stricture, fibromyalgia, GERD, HTN, pneumonia, PUD, PVD. Pt is currently receiving medical management related to diarrhea with dehydration.  Meds reviewed. IVF: NS @ 20 mL/hr. Labs reviewed: Na low, K low.   The pt reports that she has had no appetite for the past 30 days due to ongoing diarrhea. She states that she has lost about 12 lbs in this time frame. Per record, pt has experienced a 9.5% wt loss in 2 months, which is significant. RD will add Prosource Plus BID as well as Banatrol BID. Will continue to monitor PO intakes.   NUTRITION - FOCUSED PHYSICAL EXAM:  Flowsheet Row Most Recent Value  Orbital Region Mild depletion  Upper Arm Region Mild depletion  Thoracic and Lumbar Region Mild depletion  Buccal Region Mild depletion  Temple Region Mild depletion  Clavicle Bone Region Mild depletion  Clavicle and Acromion Bone Region Mild depletion  Scapular Bone Region Mild depletion  Dorsal Hand Mild depletion  Patellar Region Mild depletion  Anterior Thigh Region Mild depletion  Posterior Calf Region Mild depletion  Edema (RD Assessment) None  Hair Reviewed  Eyes Reviewed  Mouth Reviewed  Skin Reviewed  Nails Reviewed       Diet Order:   Diet Order             Diet regular Room service appropriate? Yes; Fluid consistency: Thin  Diet  effective now                   EDUCATION NEEDS:   Not appropriate for education at this time  Skin:  Skin Assessment: Reviewed RN Assessment  Last BM:  1/19 (rectal tube noted in flowsheet)  Height:   Ht Readings from Last 1 Encounters:  08/30/22 '5\' 2"'$  (1.575 m)    Weight:   Wt Readings from Last 1 Encounters:  08/30/22 60.3 kg    Ideal Body Weight:     BMI:  Body mass index is 24.31 kg/m.  Estimated Nutritional Needs:   Kcal:  1505-1810 kcals  Protein:  75-90 gm  Fluid:  >/= 1.5 L  Thalia Bloodgood, RD, LDN, CNSC.

## 2022-08-30 NOTE — Evaluation (Signed)
Clinical/Bedside Swallow Evaluation Patient Details  Name: [name] MRN: [id number] Date of Birth: [birth year]  Today's Date: 08/30/2022 Time: SLP Start Time (ACUTE ONLY): 1025 SLP Stop Time (ACUTE ONLY): 8527 SLP Time Calculation (min) (ACUTE ONLY): 18 min  Past Medical History:  Past Medical History:  Diagnosis Date   Cancer ([place]) 2014   kidney   Esophageal stricture    Fibromyalgia    GERD (gastroesophageal reflux disease)    Helicobacter pylori gastritis    Hypertension    Intention tremor    Lumbago    Memory loss    Osteoarthritis of spine    Pneumonia    hx of years ago    PUD (peptic ulcer disease)    PVD (peripheral vascular disease) ([place])    Past Surgical History:  Past Surgical History:  Procedure Laterality Date   [place]   CATARACT EXTRACTION Bilateral 2014   [facility]   LAPAROSCOPIC PARTIAL NEPHRECTOMY Left April '13   [place], Dr. Alinda Money   LAPAROTOMY N/A 07/05/2022   Procedure: EXPLORATORY LAPAROTOMY;  Surgeon: Ileana Roup, MD;  Location: [facility] OR;  Service: General;  Laterality: N/A;   LUMBAR FUSION  03/2003   L4-5   TONSILLECTOMY  1958   TUBAL LIGATION  1984   HPI:  [name] is a 82 y.o. female with medical history significant for GERD, esophageal stricture, hypertension, fibromyalgia, hypothyroidism, depression, and anxiety, presenting to the emergency department with diarrhea and weight loss. Pt currently NPO for colonoscopy 1/19.    Assessment / Plan / Recommendation  Clinical Impression  Pt reports that her history of esophageal stricture is not giving her any difficulty. She describes intermittent pharyngeal globus sensation (x1), went to "[facility] and pt described being scoped and was told she has a "blister and could have trouble swallowing". DIfficult to ascertain all info from pt but was told this may have been from being intubated. She denies coughing and reported globus   sensation only "one time." Pt reported she was told to not eat salads or apples and was on a "liquid diet" for 3 weeks at [facility]. From clinical observation, her oropharyngeal swallow appeared within normal limits without signs of aspiration and no complaints of globus sensation throughout evaluation. Her risk of aspiration appears low and does not seem to have apparent esophageal impairment. Encouraged her if symptoms worsen, she could consult GI for upper endoscopy which she verbalized understanding. Even without upper dentition (partial plate broke) she masticated solid texture timely and thoroughly. She was in agreement that she could masticate salads if chopped in smaller pieces and therapist encouraged her to do so. Pt has good awareness to continue regular texture, thin liquids, pills with thin. No further ST needed. SLP Visit Diagnosis: Dysphagia, unspecified (R13.10)    Aspiration Risk  No limitations    Diet Recommendation Regular;Thin liquid   Liquid Administration via: Straw;Cup Medication Administration: Whole meds with liquid Supervision: Patient able to self feed Postural Changes: Seated upright at 90 degrees    Other  Recommendations Recommended Consults: Consider GI evaluation (if pt's symptoms persist or worsen) Oral Care Recommendations: Oral care BID    Recommendations for follow up therapy are one component of a multi-disciplinary discharge planning process, led by the attending physician.  Recommendations may be updated based on patient status, additional functional criteria and insurance authorization.  Follow up Recommendations No SLP follow up      Assistance  Recommended at Discharge    Functional Status Assessment Patient has not had a recent decline in their functional status  Frequency and Duration            Prognosis        Swallow Study   General Date of Onset: 08/28/22 HPI: [name] is a 82 y.o. female with medical history  significant for GERD, esophageal stricture, hypertension, fibromyalgia, hypothyroidism, depression, and anxiety, presenting to the emergency department with diarrhea and weight loss. Pt currently NPO for colonoscopy 1/19. Type of Study: Bedside Swallow Evaluation Previous Swallow Assessment: none Diet Prior to this Study: Regular Temperature Spikes Noted: No Respiratory Status: Room air History of Recent Intubation: No Behavior/Cognition: Alert;Cooperative;Pleasant mood Oral Cavity Assessment: Within Functional Limits Oral Care Completed by SLP: No Oral Cavity - Dentition: Other (Comment) (upper missing- "partial is broken", natural lower) Vision: Functional for self-feeding Self-Feeding Abilities: Able to feed self Patient Positioning: Upright in bed Baseline Vocal Quality: Normal Volitional Cough: Strong Volitional Swallow: Able to elicit    Oral/Motor/Sensory Function Overall Oral Motor/Sensory Function: Within functional limits   Ice Chips Ice chips: Not tested   Thin Liquid Thin Liquid: Within functional limits Presentation: Straw    Nectar Thick Nectar Thick Liquid: Not tested   Honey Thick Honey Thick Liquid: Not tested   Puree Puree: Within functional limits   Solid     Solid: Within functional limits      Houston Siren 08/30/2022,2:54 PM

## 2022-08-30 NOTE — Interval H&P Note (Signed)
History and Physical Interval Note: PAtient was able to prep - states she is clear. Otherwise no interval changes. Plan for colonoscopy today - will take biopsies and await findings with further recommendations. She understands risks / benefits and wishes to proceed.  08/30/2022 10:53 AM  [name]  has presented today for surgery, with the diagnosis of diarrhea.  The various methods of treatment have been discussed with the patient and family. After consideration of risks, benefits and other options for treatment, the patient has consented to  Procedure(s): COLONOSCOPY WITH PROPOFOL (N/A) as a surgical intervention.  The patient's history has been reviewed, patient examined, no change in status, stable for surgery.  I have reviewed the patient's chart and labs.  Questions were answered to the patient's satisfaction.     [place]

## 2022-08-30 NOTE — Op Note (Signed)
[facility] Patient Name: [name] Procedure Date : 08/30/2022 MRN: [id number] Attending MD: Carlota Raspberry. Havery Moros , MD, [id number] Date of Birth: 09/16/1940 CSN: [id number] Age: 82 Admit Type: Inpatient Procedure:                Colonoscopy Indications:              Clinically significant diarrhea of unexplained                            origin - recent infectious workup negative (C Diff,                            GI pathogen panel), patient is not immunosuppressed. Providers:                Carlota Raspberry. Havery Moros, MD, Kendra Johns, RN,                            [name], Technician Referring MD:              Medicines:                Monitored Anesthesia Care Complications:            No immediate complications. Estimated blood loss:                            Minimal. Estimated Blood Loss:     Estimated blood loss was minimal. Procedure:                Pre-Anesthesia Assessment:                           - Prior to the procedure, a History and Physical                            was performed, and patient medications and                            allergies were reviewed. The patient's tolerance of                            previous anesthesia was also reviewed. The risks                            and benefits of the procedure and the sedation                            options and risks were discussed with the patient.                            All questions were answered, and informed consent                            was obtained. Prior Anticoagulants: The patient has  taken no anticoagulant or antiplatelet agents. ASA                            Grade Assessment: III - A patient with severe                            systemic disease. After reviewing the risks and                            benefits, the patient was deemed in satisfactory                            condition to undergo the procedure.                            After obtaining informed consent, the colonoscope                            was passed under direct vision. Throughout the                            procedure, the patient's blood pressure, pulse, and                            oxygen saturations were monitored continuously. The                            CF-HQ190L ([id number] Olympus coloscope was                            introduced through the anus with the intention of                            advancing to the cecum. The scope was advanced to                            the transverse colon before the procedure was                            aborted. Medications were given. The colonoscopy                            was performed without difficulty. The patient                            tolerated the procedure well. The quality of the                            bowel preparation was poor. The rectum was                            photographed. Scope In: 11:08:32 AM Scope Out: 11:19:30 AM Scope Withdrawal Time: 0 hours 7 minutes 37 seconds  Total Procedure Duration: 0 hours 10  minutes 58 seconds  Findings:      The perianal and digital rectal examinations were normal.      Diffuse severe inflammation characterized by erythema, friability,       granularity, loss of vascularity, mucus and serpentine ulcerations was       found in the sigmoid colon, in the descending colon and in the       transverse colon. Ulcerations noted in sigmoid colon, no ulcerations in       the descending / transverse but inflammation was severe. Given poor prep       and significant inflammation, procedure aborted in the proximal       transverse colon, full colonoscopy not done. Biopsies were taken with a       cold forceps for histology.      Internal hemorrhoids were found during retroflexion. The hemorrhoids       were small.      The prep was poor - inadequate for screening purposes. Rectosigmoid and       rectum were normal. Impression:                - Preparation of the colon was poor.                           - Diffuse severe inflammation was found in the                            sigmoid colon, in the descending colon and in the                            transverse colon. Biopsied.                           - Internal hemorrhoids.                           - Normal rectosigmoid / rectum.                           Awaiting biopsy results but likely will not be back                            for another 3-4 days (heading into weekend).                            Infectious seems less likely at this point given                            chronicity of symptoms and stool tests. Viral                            possible but seems less likely, patient is not                            immunosuppresed. Crohn's could certainly appear                            like this,  given chronicity of symptoms favor                            starting steroids while biopsies are pending. Will                            discuss with the patient. Recommendation:           - Return patient to hospital ward for ongoing care.                           - Advance diet as tolerated.                           - Continue present medications.                           - Await pathology results.                           - May start solumedrol empirically as above as                            biopsy results will take some time to return - will                            discuss with the patient                           - We will continue to follow Procedure Code(s):        --- Professional ---                           [id number], 52, Colonoscopy, flexible; with biopsy,                            single or multiple Diagnosis Code(s):        --- Professional ---                           K64.8, Other hemorrhoids                           K52.9, Noninfective gastroenteritis and colitis,                            unspecified                            R19.7, Diarrhea, unspecified CPT copyright 2022 American [employer]. All rights reserved. The codes documented in this report are preliminary and upon coder review may  be revised to meet current compliance requirements. Remo Lipps P. Ranferi Clingan, MD 08/30/2022 11:38:33 AM This report has been signed electronically. Number of Addenda: 0

## 2022-08-30 NOTE — Progress Notes (Signed)
SLP Cancellation Note  Patient Details Name: [name] MRN: [id number] DOB: 1941-05-09   Cancelled treatment:       Reason Eval/Treat Not Completed: Other (comment) (NPO for colonoscopy today). Received order for swallow assessment. Pt is currently NPO for colonoscopy today. Will continue efforts.    Houston Siren 08/30/2022, 9:01 AM

## 2022-08-30 NOTE — Progress Notes (Signed)
PROGRESS NOTE  [name] BSJ:[id number] DOB: September 17, 1940 DOA: 08/28/2022 PCP: Reynold Bowen, MD   LOS: 0 days   Brief Narrative / Interim history: 82 year old female with history of HTN, hypothyroidism, depression/anxiety who comes to the hospital with diarrhea.  She was recently hospitalized at the end of 2023 with abdominal pain, septic shock, elevated lactic acid.  She was felt to have large intestinal ischemia and was taken to OR for an ex lap by Dr. Dema Severin, without any evidence of malperfusion of any of her intestine.  Was felt to have some sort of infectious colitis.  She was discharged to [facility], but in the last 2 to 3 weeks she has developed significant watery diarrhea.  She was treated for C. difficile colitis with p.o. vancomycin QID, and apparently finished antibiotics about 4 days prior to this admission.  She is not sure whether C. difficile was ever sent or she was treated empirically.  Despite treatment, she has been having persistent diarrhea with 5 watery bowel movements per day, and in the last week she has also noticed nighttime stool incontinence while asleep and wakes up soiled.  This is never happened to her, and prior to her surgery she has been actually constipated for most of her life.  She denies any blood in her stools, she does report occasional cramping with nighttime bowel movements.  She reports progressive weakness, failure to progress with PT, weight loss  Subjective / 24h Interval events: She continues to report dysphagia this morning, difficulties eating.  Bowel prep was "difficult" overnight but she has a Flexi-Seal tube in this morning  Assesement and Plan: Principal Problem:   Diarrhea Active Problems:   Fibromyalgia   Essential hypertension   Hyponatremia   Anxiety with depression   TIA (transient ischemic attack)   Malnutrition of moderate degree   Hypoalbuminemia   Principal problem Nonspecified diarrheal illness -patient with progressive  diarrhea, 10 pound weight loss and progressive weakness mostly over the last several weeks.  This has persisted despite treating for C. difficile.  I do not see any positive C. difficile in our system or Care Everywhere -C. difficile and GI pathogen panel negative -Gastroenterology consulted, she is scheduled for colonoscopy today  Active problems Hyponatremia -due to significant dehydration, sodium, needs to improve, continue fluids  Essential hypertension-continue home medications as below  Hypothyroidism-continue Synthroid, recheck TSH within acceptable parameters  Hypomagnesemia-replete magnesium, much better this morning at 1.9  Hypokalemia-replete potassium today  Dysphagia - she is n.p.o. right now, awaiting speech eval.  She tells me this is much worse after she was intubated last December  Anemia, of chronic disease-no bleeding, monitor  History of TIA-continue aspirin, statin  Depression/anxiety -continue home medications as below  Status post ex lap December 2023 - noted  Scheduled Meds:  [MAR Hold] aspirin EC  81 mg Oral Daily   [MAR Hold] buPROPion  75 mg Oral Daily   [MAR Hold] DULoxetine  60 mg Oral Daily   [MAR Hold] enoxaparin (LOVENOX) injection  40 mg Subcutaneous Q24H   [MAR Hold] levothyroxine  75 mcg Oral QAC breakfast   [MAR Hold] lisinopril  20 mg Oral Daily   [MAR Hold] metoprolol succinate  50 mg Oral Daily   [MAR Hold] pantoprazole  40 mg Oral Daily   potassium chloride  40 mEq Oral Q3H   [MAR Hold] rosuvastatin  10 mg Oral Daily   Continuous Infusions:  sodium chloride 20 mL/hr at 08/30/22 0330   lactated ringers 10  mL/hr at 08/30/22 0958   PRN Meds:.[MAR Hold] acetaminophen **OR** [MAR Hold] acetaminophen, [MAR Hold] ALPRAZolam, [MAR Hold] oxyCODONE  Current Outpatient Medications  Medication Instructions   acetaminophen (TYLENOL) 650 mg, Oral, Every 6 hours PRN   ALPRAZolam (XANAX) 0.5 mg, Oral, 2 times daily PRN, for anxiety   aspirin EC  81 mg, Oral, Daily, Swallow whole.   buPROPion (WELLBUTRIN) 75 mg, Oral, Daily   calcium-vitamin D (OSCAL WITH D) 500-200 MG-UNIT per tablet 1 tablet, Oral, Daily   DULoxetine (CYMBALTA) 60 mg, Oral, Daily   Glycerin-Hypromellose-PEG 400 (DRY EYE RELIEF DROPS OP) 1 drop, Both Eyes, 3 times daily PRN   levothyroxine (SYNTHROID) 75 mcg, Oral, Daily before breakfast   lidocaine (XYLOCAINE) 2 % solution 15 mLs, Mouth/Throat, Every  3 hours PRN   lisinopril (ZESTRIL) 20 mg, Oral, Daily   methylcellulose (CITRUCEL) oral powder 2 tablespoons daily   metoprolol succinate (TOPROL-XL) 50 mg, Oral, Daily, Take with or immediately following a meal. NEED ANNUAL VISIT FOR FURTHER REFILLS   Multiple Vitamins-Minerals (MULTIVITAMIN WITH MINERALS) tablet 1 tablet, Oral, Daily   omeprazole (PRILOSEC) 40 mg, Oral, Daily, NEED ANNUAL VISIT FOR FURTHER REFILLS   ondansetron (ZOFRAN-ODT) 4 mg, Oral, Every 4 hours PRN   oxycodone (OXY-IR) 5 mg, Oral, Every 6 hours PRN   OXYGEN 2 L, Inhalation, Continuous   rosuvastatin (CRESTOR) 10 mg, Oral, Daily   senna (SENOKOT) 8.6 mg, Oral, Daily   vancomycin (VANCOCIN) 125 mg, 4 times daily    Diet Orders (From admission, onward)     Start     Ordered   08/30/22 0001  Diet NPO time specified Except for: Sips with Meds  Diet effective midnight       Question:  Except for  Answer:  Sips with Meds   08/29/22 1537            DVT prophylaxis: enoxaparin (LOVENOX) injection 40 mg Start: 08/29/22 0800   Lab Results  Component Value Date   PLT 440 (H) 08/30/2022      Code Status: DNR  Family Communication: no family at bedside   Status is: Observation  The patient will require care spanning > 2 midnights and should be moved to inpatient because: Persistent weakness, needs PT, speech, colonoscopy.  Unstable to rush her out until we know her diarrhea is improving and she is not going to continue to fail to thrive   Level of care: Med-Surg  Consultants:   GI  Objective: Vitals:   08/29/22 1408 08/29/22 2047 08/30/22 0825 08/30/22 0953  BP: (!) 129/39 (!) 153/71 125/73 112/71  Pulse: 87 80 97 94  Resp: '17  16 20  '$ Temp: 97.7 F (36.5 C) 99.5 F (37.5 C) 98.8 F (37.1 C) 97.9 F (36.6 C)  TempSrc: Oral Oral Oral Temporal  SpO2: 100% 97% 94% 92%  Weight:    60.3 kg  Height:    '5\' 2"'$  (1.575 m)    Intake/Output Summary (Last 24 hours) at 08/30/2022 1003 Last data filed at 08/30/2022 0700 Gross per 24 hour  Intake 128.18 ml  Output 1150 ml  Net -1021.82 ml    Wt Readings from Last 3 Encounters:  08/30/22 60.3 kg  08/08/22 62.6 kg  07/16/22 75.4 kg    Examination:  Constitutional: NAD Eyes: lids and conjunctivae normal, no scleral icterus ENMT: mmm Neck: normal, supple Respiratory: clear to auscultation bilaterally, no wheezing, no crackles. Normal respiratory effort.  Cardiovascular: Regular rate and rhythm, no murmurs / rubs / gallops.  No LE edema. Abdomen: soft, no distention, no tenderness. Bowel sounds positive.  Skin: no rashes Neurologic: no focal deficits, equal strength  Data Reviewed: I have independently reviewed following labs and imaging studies   CBC Recent Labs  Lab 08/28/22 2042 08/29/22 0424 08/30/22 0638  WBC 6.6 5.7 5.9  HGB 9.1* 8.8* 9.6*  HCT 30.6* 28.5* 30.2*  PLT 342 403* 440*  MCV 98.7 97.3 94.4  MCH 29.4 30.0 30.0  MCHC 29.7* 30.9 31.8  RDW 16.5* 16.1* 16.2*  LYMPHSABS 4.0  --   --   MONOABS 0.8  --   --   EOSABS 0.1  --   --   BASOSABS 0.0  --   --      Recent Labs  Lab 08/28/22 2042 08/29/22 0424 08/30/22 0638  NA 129* 132* 133*  K 5.1 3.7 3.1*  CL 99 105 105  CO2 19* 20* 22  GLUCOSE 100* 107* 119*  BUN 7* 5* <5*  CREATININE 0.52 0.43* 0.52  CALCIUM 7.6* 7.3* 7.4*  AST 40  --  18  ALT 13  --  15  ALKPHOS 53  --  52  BILITOT 1.2  --  0.5  ALBUMIN 1.8*  --  1.7*  MG 1.8 1.6* 1.9  TSH  --  4.935*  --       ------------------------------------------------------------------------------------------------------------------ No results for input(s): "CHOL", "HDL", "LDLCALC", "TRIG", "CHOLHDL", "LDLDIRECT" in the last 72 hours.  Lab Results  Component Value Date   HGBA1C 5.7 (H) 04/30/2022   ------------------------------------------------------------------------------------------------------------------ Recent Labs    08/29/22 0424  TSH 4.935*    Cardiac Enzymes No results for input(s): "CKMB", "TROPONINI", "MYOGLOBIN" in the last 168 hours.  Invalid input(s): "CK" ------------------------------------------------------------------------------------------------------------------ No results found for: "BNP"  CBG: No results for input(s): "GLUCAP" in the last 168 hours.  Recent Results (from the past 240 hour(s))  Gastrointestinal Panel by PCR , Stool     Status: None   Collection Time: 08/29/22  2:57 AM   Specimen: Stool  Result Value Ref Range Status   Campylobacter species NOT DETECTED NOT DETECTED Final   Plesimonas shigelloides NOT DETECTED NOT DETECTED Final   Salmonella species NOT DETECTED NOT DETECTED Final   Yersinia enterocolitica NOT DETECTED NOT DETECTED Final   Vibrio species NOT DETECTED NOT DETECTED Final   Vibrio cholerae NOT DETECTED NOT DETECTED Final   Enteroaggregative E coli (EAEC) NOT DETECTED NOT DETECTED Final   Enteropathogenic E coli (EPEC) NOT DETECTED NOT DETECTED Final   Enterotoxigenic E coli (ETEC) NOT DETECTED NOT DETECTED Final   Shiga like toxin producing E coli (STEC) NOT DETECTED NOT DETECTED Final   Shigella/Enteroinvasive E coli (EIEC) NOT DETECTED NOT DETECTED Final   Cryptosporidium NOT DETECTED NOT DETECTED Final   Cyclospora cayetanensis NOT DETECTED NOT DETECTED Final   Entamoeba histolytica NOT DETECTED NOT DETECTED Final   Giardia lamblia NOT DETECTED NOT DETECTED Final   Adenovirus F40/41 NOT DETECTED NOT DETECTED Final    Astrovirus NOT DETECTED NOT DETECTED Final   Norovirus GI/GII NOT DETECTED NOT DETECTED Final   Rotavirus A NOT DETECTED NOT DETECTED Final   Sapovirus (I, II, IV, and V) NOT DETECTED NOT DETECTED Final    Comment: Performed at [facility], [address] [place], [place] [address]  C Difficile Quick Screen w PCR reflex     Status: None   Collection Time: 08/29/22  2:57 AM   Specimen: Stool  Result Value Ref Range Status   C Diff antigen  NEGATIVE NEGATIVE Final   C Diff toxin NEGATIVE NEGATIVE Final   C Diff interpretation No C. difficile detected.  Final    Comment: Performed at [facility], [place] [address], [place], [place] [address]     Radiology Studies: DG Abd 1 View  Result Date: 08/29/2022 CLINICAL DATA:  Diarrhea and weakness. EXAM: ABDOMEN - 1 VIEW COMPARISON:  07/06/2022 FINDINGS: Gas visible in the nondistended stomach. Air is scattered through nondilated small bowel in the left abdomen with some air in stool visualized in the right colon. Bowel gas pattern is nonobstructive and no findings to suggest ileus. Curvilinear lucency overlies the left abdomen, indeterminate although pneumatosis would be a consideration. Fusion hardware noted lower lumbar spine. IMPRESSION: Nonobstructive bowel gas pattern. Linear lucency in the left abdomen is indeterminate. Pneumatosis cannot be excluded. Correlate clinically and consider CT abdomen/pelvis to further evaluate as warranted. Electronically Signed   By: Misty Stanley M.D.   On: 08/29/2022 13:33     Marzetta Board, MD, PhD [facility]  Between 7 am - 7 pm I am available, please contact me via Amion (for emergencies) or Securechat (non urgent messages)  Between 7 pm - 7 am I am not available, please contact night coverage MD/APP via Amion

## 2022-08-30 NOTE — Transfer of Care (Signed)
Immediate Anesthesia Transfer of Care Note  Patient: [name]  Procedure(s) Performed: COLONOSCOPY WITH PROPOFOL  Patient Location: PACU  Anesthesia Type:MAC  Level of Consciousness: awake, alert , and oriented  Airway & Oxygen Therapy: Patient Spontanous Breathing  Post-op Assessment: Report given to RN and Post -op Vital signs reviewed and stable  Post vital signs: Reviewed and stable  Last Vitals:  Vitals Value Taken Time  BP 138/80 08/30/22 1130  Temp    Pulse 98 08/30/22 1130  Resp 18 08/30/22 1130  SpO2 88 % 08/30/22 1130  Vitals shown include unvalidated device data.  Last Pain:  Vitals:   08/30/22 0953  TempSrc: Temporal  PainSc: 0-No pain         Complications: No notable events documented.

## 2022-08-30 NOTE — Anesthesia Preprocedure Evaluation (Addendum)
Anesthesia Evaluation  Patient identified by MRN, date of birth, ID band Patient awake    Reviewed: Allergy & Precautions, NPO status , Patient's Chart, lab work & pertinent test results  Airway Mallampati: II  TM Distance: >3 FB Neck ROM: Full    Dental  (+) Edentulous Upper   Pulmonary neg pulmonary ROS   Pulmonary exam normal        Cardiovascular hypertension, Pt. on medications and Pt. on home beta blockers + Peripheral Vascular Disease  Normal cardiovascular exam     Neuro/Psych  PSYCHIATRIC DISORDERS Anxiety Depression    TIA   GI/Hepatic Neg liver ROS, PUD, Bowel prep,GERD  Medicated and Controlled,,  Endo/Other  Hypothyroidism    Renal/GU negative Renal ROS     Musculoskeletal  (+) Arthritis ,  Fibromyalgia -  Abdominal   Peds  Hematology  (+) Blood dyscrasia, anemia   Anesthesia Other Findings diarrhea  Reproductive/Obstetrics                             Anesthesia Physical Anesthesia Plan  ASA: 3  Anesthesia Plan: MAC   Post-op Pain Management:    Induction: Intravenous  PONV Risk Score and Plan: 2 and Propofol infusion and Treatment may vary due to age or medical condition  Airway Management Planned: Nasal Cannula  Additional Equipment:   Intra-op Plan:   Post-operative Plan:   Informed Consent: I have reviewed the patients History and Physical, chart, labs and discussed the procedure including the risks, benefits and alternatives for the proposed anesthesia with the patient or authorized representative who has indicated his/her understanding and acceptance.   Patient has DNR.  Discussed DNR with patient and Suspend DNR.   Dental advisory given  Plan Discussed with: CRNA  Anesthesia Plan Comments:        Anesthesia Quick Evaluation

## 2022-08-30 NOTE — Evaluation (Signed)
Occupational Therapy Evaluation Patient Details Name: [name] MRN: [id number] DOB: [birth year] Today's Date: 08/30/2022   History of Present Illness Patient is a 82 yo female presenting to the ED with persistent diarrhea on 08/29/22. C-diff negative. Patient with recent hospitalization for septic shock / ischemic colitis. Exploratory laparotomy overall unrevealing. PMHx: constipation, fibromyalgia, anxiety, insomnia, GERD, AV block, HTN.   Clinical Impression   Prior to this admission, patient at [facility] receiving rehab. Patient reports she can transfer herself to w/c, can stand with external assist (does not currently ambulate), and receives assist for all ADLs. Currently, patient minimally perseverative on her current medical history, and is min guard for bed mobility, and mod A for ADL management. Patient did not attempt to stand as patient was found with fair amount of stool (flexi-seal still in place) and requiring peri-care. OT recommending return to SNF rehab prior to transitioning to [facility] on the 3rd of February. OT will continue to follow.      Recommendations for follow up therapy are one component of a multi-disciplinary discharge planning process, led by the attending physician.  Recommendations may be updated based on patient status, additional functional criteria and insurance authorization.   Follow Up Recommendations  Skilled nursing-short term rehab (<3 hours/day) (Patient does not want to return to [facility], has a slot at [facility] on 09/14/22.)     Assistance Recommended at Discharge Intermittent Supervision/Assistance  Patient can return home with the following A lot of help with walking and/or transfers;A lot of help with bathing/dressing/bathroom;Assistance with cooking/housework;Assist for transportation;Help with stairs or ramp for entrance    Functional Status Assessment  Patient has had a recent decline in their functional status  and demonstrates the ability to make significant improvements in function in a reasonable and predictable amount of time.  Equipment Recommendations  Other (comment) (Defer to next venue)    Recommendations for Other Services       Precautions / Restrictions Precautions Precautions: Fall Precaution Comments: flexi-seal Restrictions Weight Bearing Restrictions: No      Mobility Bed Mobility Overal bed mobility: Needs Assistance Bed Mobility: Supine to Sit, Sit to Supine     Supine to sit: Min guard, HOB elevated Sit to supine: Min guard, HOB elevated   General bed mobility comments: use of bed rail to complete, close min gaurd provided, however did not require external assist    Transfers Overall transfer level: Needs assistance                 General transfer comment: no RW in room, then patient found to be soiled and requiring peri-care therefore sit<>stand not attempted      Balance Overall balance assessment: Mild deficits observed, not formally tested                                         ADL either performed or assessed with clinical judgement   ADL Overall ADL's : Needs assistance/impaired Eating/Feeding: NPO   Grooming: Set up;Sitting   Upper Body Bathing: Min guard;Sitting   Lower Body Bathing: Moderate assistance;Sitting/lateral leans;Sit to/from stand   Upper Body Dressing : Min guard;Sitting   Lower Body Dressing: Maximal assistance;Sitting/lateral leans;Sit to/from Health and safety inspector Details (indicate cue type and reason): currently has flexi-seal, unable to attempt standing due to loose stools Toileting- Clothing Manipulation and Hygiene: Total  assistance;Bed level Toileting - Clothing Manipulation Details (indicate cue type and reason): able to roll back and forth multiple times to complete peri care     Functional mobility during ADLs: Maximal assistance;+2 for physical assistance;+2 for  safety/equipment General ADL Comments: Patient presenting with decreased activity tolerance, and need for increased assist for all ADLs     Vision Baseline Vision/History: 1 Wears glasses Ability to See in Adequate Light: 0 Adequate Patient Visual Report: No change from baseline       Perception     Praxis      Pertinent Vitals/Pain Pain Assessment Pain Assessment: No/denies pain     Hand Dominance Right   Extremity/Trunk Assessment Upper Extremity Assessment Upper Extremity Assessment: Generalized weakness   Lower Extremity Assessment Lower Extremity Assessment: Defer to PT evaluation   Cervical / Trunk Assessment Cervical / Trunk Assessment: Kyphotic (minimally)   Communication Communication Communication: No difficulties   Cognition Arousal/Alertness: Awake/alert Behavior During Therapy: WFL for tasks assessed/performed Overall Cognitive Status: Within Functional Limits for tasks assessed                                 General Comments: She's alert and oriented, but then perseverates on on her previous SNF stay, could tell me that I was OT, then asking if I was a nurse     General Comments       Exercises     Shoulder Instructions      Home Living Family/patient expects to be discharged to:: Skilled nursing facility Living Arrangements: Alone                               Additional Comments: transfers to a w/c with assist      Prior Functioning/Environment Prior Level of Function : Needs assist             Mobility Comments: transfers to a w/c with assist, can stand pivot with assist ADLs Comments: needs assist for all aspects of care        OT Problem List: Decreased strength;Decreased activity tolerance;Impaired balance (sitting and/or standing);Decreased coordination;Decreased safety awareness;Decreased knowledge of use of DME or AE;Decreased knowledge of precautions      OT Treatment/Interventions:  Self-care/ADL training;Therapeutic exercise;DME and/or AE instruction;Energy conservation;Manual therapy;Patient/family education;Balance training    OT Goals(Current goals can be found in the care plan section) Acute Rehab OT Goals Patient Stated Goal: to use a walker and walk OT Goal Formulation: With patient Time For Goal Achievement: 09/13/22 Potential to Achieve Goals: Fair  OT Frequency: Min 2X/week    Co-evaluation              AM-PAC OT "6 Clicks" Daily Activity     Outcome Measure Help from another person eating meals?: Total (NPO) Help from another person taking care of personal grooming?: A Little Help from another person toileting, which includes using toliet, bedpan, or urinal?: Total Help from another person bathing (including washing, rinsing, drying)?: A Lot Help from another person to put on and taking off regular upper body clothing?: A Little Help from another person to put on and taking off regular lower body clothing?: A Lot 6 Click Score: 12   End of Session Nurse Communication: Mobility status  Activity Tolerance: Patient limited by fatigue Patient left: in bed;with call bell/phone within reach;with nursing/sitter in room  OT Visit Diagnosis: Unsteadiness on  feet (R26.81);Other abnormalities of gait and mobility (R26.89);Muscle weakness (generalized) (M62.81);Adult, failure to thrive (R62.7)                Time: 8307-4600 OT Time Calculation (min): 32 min Charges:  OT General Charges $OT Visit: 1 Visit OT Evaluation $OT Eval Moderate Complexity: 1 Mod OT Treatments $Self Care/Home Management : 8-22 mins  Corinne Ports E. Surena Welge, OTR/L Acute Rehabilitation Services [phone]   Ascencion Dike 08/30/2022, 9:49 AM

## 2022-08-31 DIAGNOSIS — R197 Diarrhea, unspecified: Secondary | ICD-10-CM | POA: Diagnosis not present

## 2022-08-31 DIAGNOSIS — E8809 Other disorders of plasma-protein metabolism, not elsewhere classified: Secondary | ICD-10-CM | POA: Diagnosis not present

## 2022-08-31 DIAGNOSIS — I10 Essential (primary) hypertension: Secondary | ICD-10-CM | POA: Diagnosis not present

## 2022-08-31 LAB — COMPREHENSIVE METABOLIC PANEL
ALT: 13 U/L (ref 0–44)
AST: 18 U/L (ref 15–41)
Albumin: 1.8 g/dL — ABNORMAL LOW (ref 3.5–5.0)
Alkaline Phosphatase: 51 U/L (ref 38–126)
Anion gap: 6 (ref 5–15)
BUN: <5 mg/dL — ABNORMAL LOW (ref 8–23)
CO2: 24 mmol/L (ref 22–32)
Calcium: 7.8 mg/dL — ABNORMAL LOW (ref 8.9–10.3)
Chloride: 101 mmol/L (ref 98–111)
Creatinine, Ser: 0.46 mg/dL (ref 0.44–1.00)
GFR, Estimated: >60 mL/min (ref >60)
Glucose, Bld: 155 mg/dL — ABNORMAL HIGH (ref 70–99)
Potassium: 4 mmol/L (ref 3.5–5.1)
Sodium: 131 mmol/L — ABNORMAL LOW (ref 135–145)
Total Bilirubin: 0.4 mg/dL (ref 0.3–1.2)
Total Protein: 4.5 g/dL — ABNORMAL LOW (ref 6.5–8.1)

## 2022-08-31 LAB — MAGNESIUM: Magnesium: 1.8 mg/dL (ref 1.7–2.4)

## 2022-08-31 LAB — CBC
HCT: 30.5 % — ABNORMAL LOW (ref 36.0–46.0)
Hemoglobin: 9.9 g/dL — ABNORMAL LOW (ref 12.0–15.0)
MCH: 29.9 pg (ref 26.0–34.0)
MCHC: 32.5 g/dL (ref 30.0–36.0)
MCV: 92.1 fL (ref 80.0–100.0)
Platelets: 474 10*3/uL — ABNORMAL HIGH (ref 150–400)
RBC: 3.31 MIL/uL — ABNORMAL LOW (ref 3.87–5.11)
RDW: 15.8 % — ABNORMAL HIGH (ref 11.5–15.5)
WBC: 5.2 10*3/uL (ref 4.0–10.5)
nRBC: 0 % (ref 0.0–0.2)

## 2022-08-31 NOTE — Anesthesia Postprocedure Evaluation (Signed)
Anesthesia Post Note  Patient: [name]  Procedure(s) Performed: COLONOSCOPY WITH PROPOFOL BIOPSY     Patient location during evaluation: Endoscopy Anesthesia Type: MAC Level of consciousness: awake Pain management: pain level controlled Vital Signs Assessment: post-procedure vital signs reviewed and stable Respiratory status: spontaneous breathing, nonlabored ventilation and respiratory function stable Cardiovascular status: blood pressure returned to baseline and stable Postop Assessment: no apparent nausea or vomiting Anesthetic complications: no   No notable events documented.  Last Vitals:  Vitals:   08/30/22 2104 08/31/22 0502  BP: 136/67 134/73  Pulse: 88 88  Resp: 19 19  Temp: 37 C 36.9 C  SpO2: 96% 95%    Last Pain:  Vitals:   08/31/22 0502  TempSrc: Oral  PainSc:                  Karyl Kinnier Athelene Hursey

## 2022-08-31 NOTE — Progress Notes (Signed)
Progress Note   Subjective  Patient has had only one stool since colonoscopy. Seems to have a better appetite, eating on rounds today. She had several questions about colitis.    Objective   Vital signs in last 24 hours: Temp:  [97.8 F (36.6 C)-98.6 F (37 C)] 98.3 F (36.8 C) (01/20 1232) Pulse Rate:  [77-88] 87 (01/20 1232) Resp:  [18-19] 18 (01/20 1232) BP: (107-136)/(55-73) 131/68 (01/20 1232) SpO2:  [94 %-96 %] 94 % (01/20 1232) Last BM Date : 08/30/22 (rectal seal) General:    white female in NAD Neurologic:  Alert and oriented,  grossly normal neurologically. Psych:  Cooperative. Normal mood and affect.  Intake/Output from previous day: 01/19 0701 - 01/20 0700 In: 400 [I.V.:400] Out: 1500 [Urine:1500] Intake/Output this shift: Total I/O In: 240 [P.O.:240] Out: -   Lab Results: Recent Labs    08/29/22 0424 08/30/22 0638 08/31/22 0831  WBC 5.7 5.9 5.2  HGB 8.8* 9.6* 9.9*  HCT 28.5* 30.2* 30.5*  PLT 403* 440* 474*   BMET Recent Labs    08/29/22 0424 08/30/22 0638 08/31/22 0831  NA 132* 133* 131*  K 3.7 3.1* 4.0  CL 105 105 101  CO2 20* 22 24  GLUCOSE 107* 119* 155*  BUN 5* <5* <5*  CREATININE 0.43* 0.52 0.46  CALCIUM 7.3* 7.4* 7.8*   LFT Recent Labs    08/31/22 0831  PROT 4.5*  ALBUMIN 1.8*  AST 18  ALT 13  ALKPHOS 51  BILITOT 0.4   PT/INR No results for input(s): "LABPROT", "INR" in the last 72 hours.  Studies/Results: No results found.     Assessment / Plan:    82 y/o female with the following:  Colitis Severe diarrhea with malnutrition / hypoalbuminemia  Several weeks worth of symptoms. Infectious workup negative. She is not immunosuppressed. Colonoscopy yesterday with severe colitis from sigmoid through transverse colon - I did not evaluate the right colon due to severe inflammatory changes. Biopsies taken and will not be back until next week. Infectious etiology could certainly look like this but stool tests negative  so far. Biopsies will assess for viral causes but she is not immunosuppressed. No new meds she is taking that would cause this. Empirically started her on solumedrol yesterday after discussion of options. She is feeling better today. She has a lot of questions about her disposition and where she will go post hospitalization which we will sort out in the upcoming days. Good to see her eating better.  Continue steroids for now Will await pathology - hopefully back Monday or Tuesday  We will see her again on Monday, or sooner as needed. Dr. Silverio Decamp will assume her GI care Monday.  Jolly Mango, MD [facility]

## 2022-08-31 NOTE — Progress Notes (Addendum)
PROGRESS NOTE  [name] VEL:[id number] DOB: 08/01/1941 DOA: 08/28/2022 PCP: Reynold Bowen, MD   LOS: 1 day   Brief Narrative / Interim history: 82 year old female with history of HTN, hypothyroidism, depression/anxiety who comes to the hospital with diarrhea.  She was recently hospitalized at the end of 2023 with abdominal pain, septic shock, elevated lactic acid.  She was felt to have large intestinal ischemia and was taken to OR for an ex lap by Dr. Dema Severin, without any evidence of malperfusion of any of her intestine.  Was felt to have some sort of infectious colitis.  She was discharged to [facility], but in the last 2 to 3 weeks she has developed significant watery diarrhea.  She was treated for C. difficile colitis with p.o. vancomycin QID, and apparently finished antibiotics about 4 days prior to this admission.  She is not sure whether C. difficile was ever sent or she was treated empirically.  Despite treatment, she has been having persistent diarrhea with 5 watery bowel movements per day, and in the last week she has also noticed nighttime stool incontinence while asleep and wakes up soiled.  This is never happened to her, and prior to her surgery she has been actually constipated for most of her life.  She denies any blood in her stools, she does report occasional cramping with nighttime bowel movements.  She reports progressive weakness, failure to progress with PT, weight loss  Subjective / 24h Interval events: No bowel movements overnight after the colonoscopy yesterday.  No abdominal pain, nausea or vomiting.  Still feeling weak today  Assesement and Plan: Principal Problem:   Diarrhea Active Problems:   Fibromyalgia   Essential hypertension   Hyponatremia   Anxiety with depression   TIA (transient ischemic attack)   Malnutrition of moderate degree   Hypoalbuminemia   Chronic diarrhea   Noninfectious gastroenteritis   Colitis   Principal problem Severe  colitis-patient with progressive diarrhea, 10 pound weight loss and progressive weakness mostly over the last several weeks.  It appears that she was empirically treated for C. difficile in her SNF but no C. difficile positive testing was available, and patient thinks it was never done -C. difficile and GI pathogen panel negative this hospitalization -Gastroenterology consulted, she underwent a colonoscopy on 1/19 with diffuse severe inflammation in the sigmoid, descending and transverse colon, status post biopsies.  Discussed with GI, concern for Crohn's disease, she was started on steroids.  Continue.  Advance diet as tolerated  Active problems Hyponatremia -sodium overall stable, continue to monitor  Essential hypertension-continue home medications as below  Hypothyroidism-continue Synthroid, recheck TSH within acceptable parameters  Hypomagnesemia-replete magnesium, magnesium improved, stable at 1.8  Hypokalemia-potassium normalized today  Dysphagia -on a regular diet after speech evaluation.  She needs to work to fix her dentures  Anemia, of chronic disease-no bleeding, monitor  History of TIA-continue aspirin, statin  Depression/anxiety -continue home medications as below  Status post ex lap December 2023 - noted  Scheduled Meds:  (feeding supplement) PROSource Plus  30 mL Oral BID BM   aspirin EC  81 mg Oral Daily   buPROPion  75 mg Oral Daily   DULoxetine  60 mg Oral Daily   enoxaparin (LOVENOX) injection  40 mg Subcutaneous Q24H   fiber supplement (BANATROL TF)  60 mL Oral BID   levothyroxine  75 mcg Oral QAC breakfast   lisinopril  20 mg Oral Daily   methylPREDNISolone (SOLU-MEDROL) injection  60 mg Intravenous Daily  metoprolol succinate  50 mg Oral Daily   multivitamin with minerals  1 tablet Oral Daily   pantoprazole  40 mg Oral Daily   rosuvastatin  10 mg Oral Daily   Continuous Infusions:   PRN Meds:.acetaminophen **OR** acetaminophen, ALPRAZolam,  oxyCODONE  Current Outpatient Medications  Medication Instructions   acetaminophen (TYLENOL) 650 mg, Oral, Every 6 hours PRN   ALPRAZolam (XANAX) 0.5 mg, Oral, 2 times daily PRN, for anxiety   aspirin EC 81 mg, Oral, Daily, Swallow whole.   buPROPion (WELLBUTRIN) 75 mg, Oral, Daily   calcium-vitamin D (OSCAL WITH D) 500-200 MG-UNIT per tablet 1 tablet, Oral, Daily   DULoxetine (CYMBALTA) 60 mg, Oral, Daily   Glycerin-Hypromellose-PEG 400 (DRY EYE RELIEF DROPS OP) 1 drop, Both Eyes, 3 times daily PRN   levothyroxine (SYNTHROID) 75 mcg, Oral, Daily before breakfast   lidocaine (XYLOCAINE) 2 % solution 15 mLs, Mouth/Throat, Every  3 hours PRN   lisinopril (ZESTRIL) 20 mg, Oral, Daily   methylcellulose (CITRUCEL) oral powder 2 tablespoons daily   metoprolol succinate (TOPROL-XL) 50 mg, Oral, Daily, Take with or immediately following a meal. NEED ANNUAL VISIT FOR FURTHER REFILLS   Multiple Vitamins-Minerals (MULTIVITAMIN WITH MINERALS) tablet 1 tablet, Oral, Daily   omeprazole (PRILOSEC) 40 mg, Oral, Daily, NEED ANNUAL VISIT FOR FURTHER REFILLS   ondansetron (ZOFRAN-ODT) 4 mg, Oral, Every 4 hours PRN   oxycodone (OXY-IR) 5 mg, Oral, Every 6 hours PRN   OXYGEN 2 L, Inhalation, Continuous   rosuvastatin (CRESTOR) 10 mg, Oral, Daily   senna (SENOKOT) 8.6 mg, Oral, Daily   vancomycin (VANCOCIN) 125 mg, 4 times daily    Diet Orders (From admission, onward)     Start     Ordered   08/30/22 1228  Diet regular Room service appropriate? Yes; Fluid consistency: Thin  Diet effective now       Question Answer Comment  Room service appropriate? Yes   Fluid consistency: Thin      08/30/22 1227            DVT prophylaxis: enoxaparin (LOVENOX) injection 40 mg Start: 08/29/22 0800   Lab Results  Component Value Date   PLT 474 (H) 08/31/2022      Code Status: DNR  Family Communication: no family at bedside, called son [name], no answer, left message  Status is: Inpatient, severity of  illness   Level of care: Med-Surg  Consultants:  GI  Objective: Vitals:   08/30/22 1215 08/30/22 1609 08/30/22 2104 08/31/22 0502  BP: (!) 113/55 (!) 107/55 136/67 134/73  Pulse: 87 77 88 88  Resp: '20 18 19 19  '$ Temp: 98.1 F (36.7 C) 97.8 F (36.6 C) 98.6 F (37 C) 98.4 F (36.9 C)  TempSrc:   Oral Oral  SpO2: 94% 95% 96% 95%  Weight:      Height:        Intake/Output Summary (Last 24 hours) at 08/31/2022 1039 Last data filed at 08/31/2022 0745 Gross per 24 hour  Intake 640 ml  Output 1500 ml  Net -860 ml    Wt Readings from Last 3 Encounters:  08/30/22 60.3 kg  08/08/22 62.6 kg  07/16/22 75.4 kg    Examination:  Constitutional: NAD Eyes: lids and conjunctivae normal, no scleral icterus ENMT: mmm Neck: normal, supple Respiratory: clear to auscultation bilaterally, no wheezing, no crackles. Normal respiratory effort.  Cardiovascular: Regular rate and rhythm, no murmurs / rubs / gallops.  Abdomen: soft, no distention, no tenderness. Bowel sounds positive.  Skin: no rashes Neurologic: no focal deficits, equal strength  Data Reviewed: I have independently reviewed following labs and imaging studies   CBC Recent Labs  Lab 08/28/22 2042 08/29/22 0424 08/30/22 0638 08/31/22 0831  WBC 6.6 5.7 5.9 5.2  HGB 9.1* 8.8* 9.6* 9.9*  HCT 30.6* 28.5* 30.2* 30.5*  PLT 342 403* 440* 474*  MCV 98.7 97.3 94.4 92.1  MCH 29.4 30.0 30.0 29.9  MCHC 29.7* 30.9 31.8 32.5  RDW 16.5* 16.1* 16.2* 15.8*  LYMPHSABS 4.0  --   --   --   MONOABS 0.8  --   --   --   EOSABS 0.1  --   --   --   BASOSABS 0.0  --   --   --      Recent Labs  Lab 08/28/22 2042 08/29/22 0424 08/30/22 0638 08/31/22 0831  NA 129* 132* 133* 131*  K 5.1 3.7 3.1* 4.0  CL 99 105 105 101  CO2 19* 20* 22 24  GLUCOSE 100* 107* 119* 155*  BUN 7* 5* <5* <5*  CREATININE 0.52 0.43* 0.52 0.46  CALCIUM 7.6* 7.3* 7.4* 7.8*  AST 40  --  18 18  ALT 13  --  15 13  ALKPHOS 53  --  52 51  BILITOT 1.2  --   0.5 0.4  ALBUMIN 1.8*  --  1.7* 1.8*  MG 1.8 1.6* 1.9 1.8  TSH  --  4.935*  --   --      ------------------------------------------------------------------------------------------------------------------ No results for input(s): "CHOL", "HDL", "LDLCALC", "TRIG", "CHOLHDL", "LDLDIRECT" in the last 72 hours.  Lab Results  Component Value Date   HGBA1C 5.7 (H) 04/30/2022   ------------------------------------------------------------------------------------------------------------------ Recent Labs    08/29/22 0424  TSH 4.935*     Cardiac Enzymes No results for input(s): "CKMB", "TROPONINI", "MYOGLOBIN" in the last 168 hours.  Invalid input(s): "CK" ------------------------------------------------------------------------------------------------------------------ No results found for: "BNP"  CBG: No results for input(s): "GLUCAP" in the last 168 hours.  Recent Results (from the past 240 hour(s))  Gastrointestinal Panel by PCR , Stool     Status: None   Collection Time: 08/29/22  2:57 AM   Specimen: Stool  Result Value Ref Range Status   Campylobacter species NOT DETECTED NOT DETECTED Final   Plesimonas shigelloides NOT DETECTED NOT DETECTED Final   Salmonella species NOT DETECTED NOT DETECTED Final   Yersinia enterocolitica NOT DETECTED NOT DETECTED Final   Vibrio species NOT DETECTED NOT DETECTED Final   Vibrio cholerae NOT DETECTED NOT DETECTED Final   Enteroaggregative E coli (EAEC) NOT DETECTED NOT DETECTED Final   Enteropathogenic E coli (EPEC) NOT DETECTED NOT DETECTED Final   Enterotoxigenic E coli (ETEC) NOT DETECTED NOT DETECTED Final   Shiga like toxin producing E coli (STEC) NOT DETECTED NOT DETECTED Final   Shigella/Enteroinvasive E coli (EIEC) NOT DETECTED NOT DETECTED Final   Cryptosporidium NOT DETECTED NOT DETECTED Final   Cyclospora cayetanensis NOT DETECTED NOT DETECTED Final   Entamoeba histolytica NOT DETECTED NOT DETECTED Final   Giardia lamblia NOT  DETECTED NOT DETECTED Final   Adenovirus F40/41 NOT DETECTED NOT DETECTED Final   Astrovirus NOT DETECTED NOT DETECTED Final   Norovirus GI/GII NOT DETECTED NOT DETECTED Final   Rotavirus A NOT DETECTED NOT DETECTED Final   Sapovirus (I, II, IV, and V) NOT DETECTED NOT DETECTED Final    Comment: Performed at [facility], [address] [place], [place] [address]  C Difficile Quick Screen w PCR reflex  Status: None   Collection Time: 08/29/22  2:57 AM   Specimen: Stool  Result Value Ref Range Status   C Diff antigen NEGATIVE NEGATIVE Final   C Diff toxin NEGATIVE NEGATIVE Final   C Diff interpretation No C. difficile detected.  Final    Comment: Performed at [facility], [place] [address] [place], [place] [address]     Radiology Studies: No results found.   Marzetta Board, MD, PhD [facility]  Between 7 am - 7 pm I am available, please contact me via Amion (for emergencies) or Securechat (non urgent messages)  Between 7 pm - 7 am I am not available, please contact night coverage MD/APP via Amion

## 2022-08-31 NOTE — Evaluation (Signed)
Physical Therapy Evaluation Patient Details Name: Brodie Scovell MRN: [id number] DOB: February 15, 1941 Today's Date: 08/31/2022  History of Present Illness  Patient is a 82 yo female presenting to the ED with persistent diarrhea on 08/29/22. C-diff negative. Patient with recent hospitalization for septic shock / ischemic colitis. Exploratory laparotomy overall unrevealing. PMHx: constipation, fibromyalgia, anxiety, insomnia, GERD, AV block, HTN.   Clinical Impression  Pt presents with condition above and deficits mentioned below, see PT Problem List. PTA, patient was at [facility] receiving rehab. She reports she had progressed to transferring and ambulating up to ~175 ft with a RW and +1 assistance there. She reports she will not go back there due to concerns in regards to her care. Currently, pt is demonstrating deficits in gross strength, bil ankle dorsiflexion and eversion ROM and strength, power, activity tolerance, and balance. She tends to lean posteriorly when standing and display ankle instability that places her at risk for an ankle inversion injury and at risk for falls. These deficits could be due to her limitations in ankle dorsiflexion and eversion ROM and strength. She required modA to transfer to stand and take a few steps with a RW today. Pt would benefit from further rehab at a SNF, but she does not want to return to [facility]. She plans to go to [facility] once she is able, which she reports should now be 09/06/22. If she instead goes directly to the ALF, then recommend HHPT there. Will continue to follow acutely.     Recommendations for follow up therapy are one component of a multi-disciplinary discharge planning process, led by the attending physician.  Recommendations may be updated based on patient status, additional functional criteria and insurance authorization.  Follow Up Recommendations Skilled nursing-short term rehab (<3 hours/day) (Patient does not want to return  to [facility], has a slot at [place] now changed to 09/06/22) Can patient physically be transported by private vehicle: Yes    Assistance Recommended at Discharge Frequent or constant Supervision/Assistance  Patient can return home with the following  A lot of help with walking and/or transfers;A little help with bathing/dressing/bathroom;Assistance with cooking/housework;Help with stairs or ramp for entrance;Assist for transportation    Equipment Recommendations Rolling walker (2 wheels);BSC/3in1;Wheelchair (measurements PT);Wheelchair cushion (measurements PT)  Recommendations for Other Services       Functional Status Assessment Patient has had a recent decline in their functional status and demonstrates the ability to make significant improvements in function in a reasonable and predictable amount of time.     Precautions / Restrictions Precautions Precautions: Fall Restrictions Weight Bearing Restrictions: No      Mobility  Bed Mobility Overal bed mobility: Needs Assistance Bed Mobility: Supine to Sit     Supine to sit: Min guard, HOB elevated     General bed mobility comments: Extra time to come to sit R EOB using bed rails with HOB elevated. Increased time to scoot buttocks to edge.    Transfers Overall transfer level: Needs assistance Equipment used: Rolling walker (2 wheels) Transfers: Sit to/from Stand, Bed to chair/wheelchair/BSC Sit to Stand: Mod assist   Step pivot transfers: Mod assist       General transfer comment: Pt with posterior lean and flexed posture, needing cues to stand upright and place weight anteriorly through toes and RW, mod success on 2nd stand. ModA to power up to stand and gain stability with x2 transfers to stand from EOB and x1 from recliner. ModA for stability  and RW management to stand step to L bed > chair.    Ambulation/Gait Ambulation/Gait assistance: Mod assist Gait Distance (Feet): 3 Feet (x2 bouts of ~2 ft > ~3  ft) Assistive device: Rolling walker (2 wheels) Gait Pattern/deviations: Step-to pattern, Decreased step length - right, Decreased step length - left, Decreased stride length, Decreased dorsiflexion - right, Decreased dorsiflexion - left, Trunk flexed, Leaning posteriorly, Narrow base of support Gait velocity: reduced Gait velocity interpretation: <1.31 ft/sec, indicative of household ambulator   General Gait Details: Pt with noted ankle instability, cuing pt to widen BOS to try to prevent ankle inversion injury, noted success but still unstable. Pt with slow, small unsteady steps, needing modA for stability and cuing to improve posture and reduce posterior lean. Quick to sit 1x.  Stairs            Wheelchair Mobility    Modified Rankin (Stroke Patients Only)       Balance Overall balance assessment: Needs assistance Sitting-balance support: No upper extremity supported, Feet supported Sitting balance-Leahy Scale: Good Sitting balance - Comments: Reaches off BOS to donn socks EOB with min guard Postural control: Posterior lean Standing balance support: Bilateral upper extremity supported, During functional activity, Reliant on assistive device for balance Standing balance-Leahy Scale: Poor Standing balance comment: Reliant on RW and up to modA for standing balance, posterior lean noted.                             Pertinent Vitals/Pain Pain Assessment Pain Assessment: Faces Faces Pain Scale: No hurt Pain Intervention(s): Monitored during session    Home Living Family/patient expects to be discharged to:: Skilled nursing facility Living Arrangements: Alone                 Additional Comments: transfers to a w/c with assist    Prior Function Prior Level of Function : Needs assist             Mobility Comments: transfers to a w/c with assist, can stand pivot with assist, had progressed to walking ~175 ft with +1 assistance using the RW while at the  SNF ADLs Comments: needs assist for all aspects of care     Hand Dominance   Dominant Hand: Right    Extremity/Trunk Assessment   Upper Extremity Assessment Upper Extremity Assessment: Defer to OT evaluation    Lower Extremity Assessment Lower Extremity Assessment: Generalized weakness;RLE deficits/detail;LLE deficits/detail RLE Deficits / Details: noted decreased ankle dorsiflexion PROM to about -5'; gross weakness, especially at ankles LLE Deficits / Details: noted decreased ankle dorsiflexion PROM to about -5'; gross weakness, especially at ankles    Cervical / Trunk Assessment Cervical / Trunk Assessment: Kyphotic (minimally)  Communication   Communication: No difficulties  Cognition Arousal/Alertness: Awake/alert Behavior During Therapy: WFL for tasks assessed/performed Overall Cognitive Status: Within Functional Limits for tasks assessed                                 General Comments: perseverates on on her previous SNF stay, upset in regards to her condition and aspects of her SNF stay        General Comments      Exercises General Exercises - Lower Extremity Ankle Circles/Pumps: AROM, Both, 5 reps, Seated (encouraged ankle dorsiflexion and eversion specifically) Other Exercises Other Exercises: self stretch into ankle dorsiflexion with knees extended using sheet, 1x ~  30 sec bil Other Exercises: Positioned pt end of session sitting up in chair with blankets stacked under feet on floor to encourage ankle dorsiflexion   Assessment/Plan    PT Assessment Patient needs continued PT services  PT Problem List Decreased strength;Decreased range of motion;Decreased activity tolerance;Decreased balance;Decreased mobility;Decreased coordination       PT Treatment Interventions DME instruction;Gait training;Functional mobility training;Therapeutic activities;Balance training;Therapeutic exercise;Neuromuscular re-education;Patient/family education    PT  Goals (Current goals can be found in the Care Plan section)  Acute Rehab PT Goals Patient Stated Goal: to walk PT Goal Formulation: With patient Time For Goal Achievement: 09/14/22 Potential to Achieve Goals: Good    Frequency Min 3X/week     Co-evaluation               AM-PAC PT "6 Clicks" Mobility  Outcome Measure Help needed turning from your back to your side while in a flat bed without using bedrails?: A Little Help needed moving from lying on your back to sitting on the side of a flat bed without using bedrails?: A Little Help needed moving to and from a bed to a chair (including a wheelchair)?: A Lot Help needed standing up from a chair using your arms (e.g., wheelchair or bedside chair)?: A Lot Help needed to walk in hospital room?: Total Help needed climbing 3-5 steps with a railing? : Total 6 Click Score: 12    End of Session Equipment Utilized During Treatment: Gait belt Activity Tolerance: Patient tolerated treatment well Patient left: in chair;with call bell/phone within reach;with chair alarm set (no grey cord from wall to alarm box-notified RN) Nurse Communication: Mobility status;Other (comment) (no grey cord from wall to alarm box) PT Visit Diagnosis: Unsteadiness on feet (R26.81);Other abnormalities of gait and mobility (R26.89);Muscle weakness (generalized) (M62.81);Difficulty in walking, not elsewhere classified (R26.2)    Time: 0175-1025 PT Time Calculation (min) (ACUTE ONLY): 33 min   Charges:   PT Evaluation $PT Eval Moderate Complexity: 1 Mod PT Treatments $Therapeutic Activity: 8-22 mins        Moishe Spice, PT, DPT Acute Rehabilitation Services  Office: [phone]   Orvan Falconer 08/31/2022, 4:53 PM

## 2022-09-01 ENCOUNTER — Encounter (HOSPITAL_COMMUNITY): Payer: Self-pay | Admitting: Gastroenterology

## 2022-09-01 DIAGNOSIS — R197 Diarrhea, unspecified: Secondary | ICD-10-CM | POA: Diagnosis not present

## 2022-09-01 DIAGNOSIS — E8809 Other disorders of plasma-protein metabolism, not elsewhere classified: Secondary | ICD-10-CM | POA: Diagnosis not present

## 2022-09-01 LAB — COMPREHENSIVE METABOLIC PANEL
ALT: 16 U/L (ref 0–44)
AST: 24 U/L (ref 15–41)
Albumin: 1.7 g/dL — ABNORMAL LOW (ref 3.5–5.0)
Alkaline Phosphatase: 50 U/L (ref 38–126)
Anion gap: 7 (ref 5–15)
BUN: 6 mg/dL — ABNORMAL LOW (ref 8–23)
CO2: 22 mmol/L (ref 22–32)
Calcium: 7.9 mg/dL — ABNORMAL LOW (ref 8.9–10.3)
Chloride: 102 mmol/L (ref 98–111)
Creatinine, Ser: 0.46 mg/dL (ref 0.44–1.00)
GFR, Estimated: >60 mL/min (ref >60)
Glucose, Bld: 137 mg/dL — ABNORMAL HIGH (ref 70–99)
Potassium: 3.9 mmol/L (ref 3.5–5.1)
Sodium: 131 mmol/L — ABNORMAL LOW (ref 135–145)
Total Bilirubin: 0.2 mg/dL — ABNORMAL LOW (ref 0.3–1.2)
Total Protein: 4.3 g/dL — ABNORMAL LOW (ref 6.5–8.1)

## 2022-09-01 LAB — CBC
HCT: 26.8 % — ABNORMAL LOW (ref 36.0–46.0)
Hemoglobin: 8.8 g/dL — ABNORMAL LOW (ref 12.0–15.0)
MCH: 30.3 pg (ref 26.0–34.0)
MCHC: 32.8 g/dL (ref 30.0–36.0)
MCV: 92.4 fL (ref 80.0–100.0)
Platelets: 443 10*3/uL — ABNORMAL HIGH (ref 150–400)
RBC: 2.9 MIL/uL — ABNORMAL LOW (ref 3.87–5.11)
RDW: 15.7 % — ABNORMAL HIGH (ref 11.5–15.5)
WBC: 4.8 10*3/uL (ref 4.0–10.5)
nRBC: 0.4 % — ABNORMAL HIGH (ref 0.0–0.2)

## 2022-09-01 LAB — MAGNESIUM: Magnesium: 1.8 mg/dL (ref 1.7–2.4)

## 2022-09-01 NOTE — Progress Notes (Signed)
PROGRESS NOTE  [name] QBH:[id number] DOB: 07-18-1941 DOA: 08/28/2022 PCP: Reynold Bowen, MD   LOS: 2 days   Brief Narrative / Interim history: 82 year old female with history of HTN, hypothyroidism, depression/anxiety who comes to the hospital with diarrhea.  She was recently hospitalized at the end of 2023 with abdominal pain, septic shock, elevated lactic acid.  She was felt to have large intestinal ischemia and was taken to OR for an ex lap by Dr. Dema Severin, without any evidence of malperfusion of any of her intestine.  Was felt to have some sort of infectious colitis.  She was discharged to [facility], but in the last 2 to 3 weeks she has developed significant watery diarrhea.  She was treated for C. difficile colitis with p.o. vancomycin QID, and apparently finished antibiotics about 4 days prior to this admission.  She is not sure whether C. difficile was ever sent or she was treated empirically.  Despite treatment, she has been having persistent diarrhea with 5 watery bowel movements per day, and in the last week she has also noticed nighttime stool incontinence while asleep and wakes up soiled.  This is never happened to her, and prior to her surgery she has been actually constipated for most of her life.  She denies any blood in her stools, she does report occasional cramping with nighttime bowel movements.  She reports progressive weakness, failure to progress with PT, weight loss  Subjective / 24h Interval events: Only 1 bowel movement last night, liquid.  No abdominal pain, no nausea or vomiting this morning.  She tells me that her appetite is good.  Assesement and Plan: Principal Problem:   Diarrhea Active Problems:   Fibromyalgia   Essential hypertension   Hyponatremia   Anxiety with depression   TIA (transient ischemic attack)   Malnutrition of moderate degree   Hypoalbuminemia   Chronic diarrhea   Noninfectious gastroenteritis   Colitis   Principal problem Severe  colitis-patient with progressive diarrhea, 10 pound weight loss and progressive weakness mostly over the last several weeks.  It appears that she was empirically treated for C. difficile in her SNF but no C. difficile positive testing was available, and patient thinks it was never done -C. difficile and GI pathogen panel negative this hospitalization -Gastroenterology consulted, she underwent a colonoscopy on 1/19 with diffuse severe inflammation in the sigmoid, descending and transverse colon, status post biopsies.  Discussed with GI, concern for Crohn's disease, she was started on steroids.  Continue steroids, continue to closely monitor  Active problems Hyponatremia -sodium overall stable at 131 this morning  Essential hypertension-continue home medications as below  Hypothyroidism-continue Synthroid, recheck TSH within acceptable parameters  Hypomagnesemia-replete magnesium, magnesium improved, stable this morning at 1.8  Hypokalemia-potassium remains normal  Dysphagia -on a regular diet after speech evaluation.  She needs to work to fix her dentures  Anemia, of chronic disease-no bleeding, monitor  History of TIA-continue aspirin, statin  Depression/anxiety -continue home medications as below  Status post ex lap December 2023 - noted  Scheduled Meds:  (feeding supplement) PROSource Plus  30 mL Oral BID BM   aspirin EC  81 mg Oral Daily   buPROPion  75 mg Oral Daily   DULoxetine  60 mg Oral Daily   enoxaparin (LOVENOX) injection  40 mg Subcutaneous Q24H   fiber supplement (BANATROL TF)  60 mL Oral BID   levothyroxine  75 mcg Oral QAC breakfast   lisinopril  20 mg Oral Daily   methylPREDNISolone (  SOLU-MEDROL) injection  60 mg Intravenous Daily   metoprolol succinate  50 mg Oral Daily   multivitamin with minerals  1 tablet Oral Daily   pantoprazole  40 mg Oral Daily   rosuvastatin  10 mg Oral Daily   Continuous Infusions:   PRN Meds:.acetaminophen **OR** acetaminophen,  ALPRAZolam, oxyCODONE  Current Outpatient Medications  Medication Instructions   acetaminophen (TYLENOL) 650 mg, Oral, Every 6 hours PRN   ALPRAZolam (XANAX) 0.5 mg, Oral, 2 times daily PRN, for anxiety   aspirin EC 81 mg, Oral, Daily, Swallow whole.   buPROPion (WELLBUTRIN) 75 mg, Oral, Daily   calcium-vitamin D (OSCAL WITH D) 500-200 MG-UNIT per tablet 1 tablet, Oral, Daily   DULoxetine (CYMBALTA) 60 mg, Oral, Daily   Glycerin-Hypromellose-PEG 400 (DRY EYE RELIEF DROPS OP) 1 drop, Both Eyes, 3 times daily PRN   levothyroxine (SYNTHROID) 75 mcg, Oral, Daily before breakfast   lidocaine (XYLOCAINE) 2 % solution 15 mLs, Mouth/Throat, Every  3 hours PRN   lisinopril (ZESTRIL) 20 mg, Oral, Daily   methylcellulose (CITRUCEL) oral powder 2 tablespoons daily   metoprolol succinate (TOPROL-XL) 50 mg, Oral, Daily, Take with or immediately following a meal. NEED ANNUAL VISIT FOR FURTHER REFILLS   Multiple Vitamins-Minerals (MULTIVITAMIN WITH MINERALS) tablet 1 tablet, Oral, Daily   omeprazole (PRILOSEC) 40 mg, Oral, Daily, NEED ANNUAL VISIT FOR FURTHER REFILLS   ondansetron (ZOFRAN-ODT) 4 mg, Oral, Every 4 hours PRN   oxycodone (OXY-IR) 5 mg, Oral, Every 6 hours PRN   OXYGEN 2 L, Inhalation, Continuous   rosuvastatin (CRESTOR) 10 mg, Oral, Daily   senna (SENOKOT) 8.6 mg, Oral, Daily   vancomycin (VANCOCIN) 125 mg, 4 times daily    Diet Orders (From admission, onward)     Start     Ordered   08/30/22 1228  Diet regular Room service appropriate? Yes; Fluid consistency: Thin  Diet effective now       Question Answer Comment  Room service appropriate? Yes   Fluid consistency: Thin      08/30/22 1227            DVT prophylaxis: enoxaparin (LOVENOX) injection 40 mg Start: 08/29/22 0800   Lab Results  Component Value Date   PLT 443 (H) 09/01/2022      Code Status: DNR  Family Communication: no family at bedside, called son Jori Moll, no answer, left message  Status is: Inpatient,  severity of illness   Level of care: Med-Surg  Consultants:  GI  Objective: Vitals:   08/31/22 2035 09/01/22 0356 09/01/22 0500 09/01/22 1001  BP: 131/76 132/65  (!) 123/59  Pulse: 85 79  75  Resp: '18 17  17  '$ Temp: 97.8 F (36.6 C) 98.1 F (36.7 C)  98.3 F (36.8 C)  TempSrc: Oral Oral  Oral  SpO2: 100% 97%  98%  Weight:   60.6 kg   Height:        Intake/Output Summary (Last 24 hours) at 09/01/2022 1107 Last data filed at 09/01/2022 0356 Gross per 24 hour  Intake 240 ml  Output 700 ml  Net -460 ml    Wt Readings from Last 3 Encounters:  09/01/22 60.6 kg  08/08/22 62.6 kg  07/16/22 75.4 kg    Examination: Constitutional: NAD Eyes: lids and conjunctivae normal, no scleral icterus ENMT: mmm Neck: normal, supple Respiratory: clear to auscultation bilaterally, no wheezing, no crackles. Normal respiratory effort.  Cardiovascular: Regular rate and rhythm, no murmurs / rubs / gallops. No LE edema. Abdomen: soft, no  distention, no tenderness. Bowel sounds positive.   Data Reviewed: I have independently reviewed following labs and imaging studies   CBC Recent Labs  Lab 08/28/22 2042 08/29/22 0424 08/30/22 0638 08/31/22 0831 09/01/22 0442  WBC 6.6 5.7 5.9 5.2 4.8  HGB 9.1* 8.8* 9.6* 9.9* 8.8*  HCT 30.6* 28.5* 30.2* 30.5* 26.8*  PLT 342 403* 440* 474* 443*  MCV 98.7 97.3 94.4 92.1 92.4  MCH 29.4 30.0 30.0 29.9 30.3  MCHC 29.7* 30.9 31.8 32.5 32.8  RDW 16.5* 16.1* 16.2* 15.8* 15.7*  LYMPHSABS 4.0  --   --   --   --   MONOABS 0.8  --   --   --   --   EOSABS 0.1  --   --   --   --   BASOSABS 0.0  --   --   --   --      Recent Labs  Lab 08/28/22 2042 08/29/22 0424 08/30/22 0638 08/31/22 0831 09/01/22 0442  NA 129* 132* 133* 131* 131*  K 5.1 3.7 3.1* 4.0 3.9  CL 99 105 105 101 102  CO2 19* 20* '22 24 22  '$ GLUCOSE 100* 107* 119* 155* 137*  BUN 7* 5* <5* <5* 6*  CREATININE 0.52 0.43* 0.52 0.46 0.46  CALCIUM 7.6* 7.3* 7.4* 7.8* 7.9*  AST 40  --  '18 18 24   '$ ALT 13  --  '15 13 16  '$ ALKPHOS 53  --  52 51 50  BILITOT 1.2  --  0.5 0.4 0.2*  ALBUMIN 1.8*  --  1.7* 1.8* 1.7*  MG 1.8 1.6* 1.9 1.8 1.8  TSH  --  4.935*  --   --   --      ------------------------------------------------------------------------------------------------------------------ No results for input(s): "CHOL", "HDL", "LDLCALC", "TRIG", "CHOLHDL", "LDLDIRECT" in the last 72 hours.  Lab Results  Component Value Date   HGBA1C 5.7 (H) 04/30/2022   ------------------------------------------------------------------------------------------------------------------ No results for input(s): "TSH", "T4TOTAL", "T3FREE", "THYROIDAB" in the last 72 hours.  Invalid input(s): "FREET3"   Cardiac Enzymes No results for input(s): "CKMB", "TROPONINI", "MYOGLOBIN" in the last 168 hours.  Invalid input(s): "CK" ------------------------------------------------------------------------------------------------------------------ No results found for: "BNP"  CBG: No results for input(s): "GLUCAP" in the last 168 hours.  Recent Results (from the past 240 hour(s))  Gastrointestinal Panel by PCR , Stool     Status: None   Collection Time: 08/29/22  2:57 AM   Specimen: Stool  Result Value Ref Range Status   Campylobacter species NOT DETECTED NOT DETECTED Final   Plesimonas shigelloides NOT DETECTED NOT DETECTED Final   Salmonella species NOT DETECTED NOT DETECTED Final   Yersinia enterocolitica NOT DETECTED NOT DETECTED Final   Vibrio species NOT DETECTED NOT DETECTED Final   Vibrio cholerae NOT DETECTED NOT DETECTED Final   Enteroaggregative E coli (EAEC) NOT DETECTED NOT DETECTED Final   Enteropathogenic E coli (EPEC) NOT DETECTED NOT DETECTED Final   Enterotoxigenic E coli (ETEC) NOT DETECTED NOT DETECTED Final   Shiga like toxin producing E coli (STEC) NOT DETECTED NOT DETECTED Final   Shigella/Enteroinvasive E coli (EIEC) NOT DETECTED NOT DETECTED Final   Cryptosporidium NOT DETECTED  NOT DETECTED Final   Cyclospora cayetanensis NOT DETECTED NOT DETECTED Final   Entamoeba histolytica NOT DETECTED NOT DETECTED Final   Giardia lamblia NOT DETECTED NOT DETECTED Final   Adenovirus F40/41 NOT DETECTED NOT DETECTED Final   Astrovirus NOT DETECTED NOT DETECTED Final   Norovirus GI/GII NOT DETECTED NOT DETECTED Final  Rotavirus A NOT DETECTED NOT DETECTED Final   Sapovirus (I, II, IV, and V) NOT DETECTED NOT DETECTED Final    Comment: Performed at [facility], [address] [place], [place] [address]  C Difficile Quick Screen w PCR reflex     Status: None   Collection Time: 08/29/22  2:57 AM   Specimen: Stool  Result Value Ref Range Status   C Diff antigen NEGATIVE NEGATIVE Final   C Diff toxin NEGATIVE NEGATIVE Final   C Diff interpretation No C. difficile detected.  Final    Comment: Performed at [facility], [place] [address] [place], [place] [address]     Radiology Studies: No results found.   Marzetta Board, MD, PhD [facility]  Between 7 am - 7 pm I am available, please contact me via Amion (for emergencies) or Securechat (non urgent messages)  Between 7 pm - 7 am I am not available, please contact night coverage MD/APP via Amion

## 2022-09-02 DIAGNOSIS — I10 Essential (primary) hypertension: Secondary | ICD-10-CM | POA: Diagnosis not present

## 2022-09-02 DIAGNOSIS — E43 Unspecified severe protein-calorie malnutrition: Secondary | ICD-10-CM

## 2022-09-02 DIAGNOSIS — K50118 Crohn's disease of large intestine with other complication: Secondary | ICD-10-CM | POA: Diagnosis not present

## 2022-09-02 DIAGNOSIS — E8809 Other disorders of plasma-protein metabolism, not elsewhere classified: Secondary | ICD-10-CM | POA: Diagnosis not present

## 2022-09-02 DIAGNOSIS — R197 Diarrhea, unspecified: Secondary | ICD-10-CM | POA: Diagnosis not present

## 2022-09-02 LAB — MAGNESIUM: Magnesium: 1.8 mg/dL (ref 1.7–2.4)

## 2022-09-02 LAB — CBC
HCT: 28.7 % — ABNORMAL LOW (ref 36.0–46.0)
Hemoglobin: 9.3 g/dL — ABNORMAL LOW (ref 12.0–15.0)
MCH: 30.2 pg (ref 26.0–34.0)
MCHC: 32.4 g/dL (ref 30.0–36.0)
MCV: 93.2 fL (ref 80.0–100.0)
Platelets: 478 K/uL — ABNORMAL HIGH (ref 150–400)
RBC: 3.08 MIL/uL — ABNORMAL LOW (ref 3.87–5.11)
RDW: 15.4 % (ref 11.5–15.5)
WBC: 9.4 K/uL (ref 4.0–10.5)
nRBC: 0.2 % (ref 0.0–0.2)

## 2022-09-02 LAB — BASIC METABOLIC PANEL
Anion gap: 7 (ref 5–15)
BUN: 8 mg/dL (ref 8–23)
CO2: 25 mmol/L (ref 22–32)
Calcium: 7.8 mg/dL — ABNORMAL LOW (ref 8.9–10.3)
Chloride: 101 mmol/L (ref 98–111)
Creatinine, Ser: 0.4 mg/dL — ABNORMAL LOW (ref 0.44–1.00)
GFR, Estimated: >60 mL/min (ref >60)
Glucose, Bld: 123 mg/dL — ABNORMAL HIGH (ref 70–99)
Potassium: 3.4 mmol/L — ABNORMAL LOW (ref 3.5–5.1)
Sodium: 133 mmol/L — ABNORMAL LOW (ref 135–145)

## 2022-09-02 LAB — SURGICAL PATHOLOGY

## 2022-09-02 MED ORDER — KATE FARMS STANDARD 1.4 PO LIQD
325.0000 mL | Freq: Two times a day (BID) | ORAL | Status: DC
  Administered 2022-09-02 – 2022-09-11 (×18): 325 mL via ORAL
  Filled 2022-09-02 (×22): qty 325

## 2022-09-02 MED ORDER — POTASSIUM CHLORIDE CRYS ER 20 MEQ PO TBCR
40.0000 meq | EXTENDED_RELEASE_TABLET | Freq: Once | ORAL | Status: AC
  Administered 2022-09-02: 40 meq via ORAL
  Filled 2022-09-02: qty 2

## 2022-09-02 NOTE — Progress Notes (Signed)
PROGRESS NOTE  [name] CVE:[id number] DOB: 04/25/1941 DOA: 08/28/2022 PCP: Reynold Bowen, MD   LOS: 3 days   Brief Narrative / Interim history: 82 year old female with history of HTN, hypothyroidism, depression/anxiety who comes to the hospital with diarrhea.  She was recently hospitalized at the end of 2023 with abdominal pain, septic shock, elevated lactic acid.  She was felt to have large intestinal ischemia and was taken to OR for an ex lap by Dr. Dema Severin, without any evidence of malperfusion of any of her intestine.  Was felt to have some sort of infectious colitis.  She was discharged to [facility], but in the last 2 to 3 weeks she has developed significant watery diarrhea.  She was treated for C. difficile colitis with p.o. vancomycin QID, and apparently finished antibiotics about 4 days prior to this admission.  She is not sure whether C. difficile was ever sent or she was treated empirically.  Despite treatment, she has been having persistent diarrhea with 5 watery bowel movements per day, and in the last week she has also noticed nighttime stool incontinence while asleep and wakes up soiled.  This is never happened to her, and prior to her surgery she has been actually constipated for most of her life.  She denies any blood in her stools, she does report occasional cramping with nighttime bowel movements.  She reports progressive weakness, failure to progress with PT, weight loss  Subjective / 24h Interval events: She only had 1 bowel movement yesterday, loose.  Overall appreciates improvement.  Appetite is improved and she is eating more.  Assesement and Plan: Principal Problem:   Diarrhea Active Problems:   Fibromyalgia   Essential hypertension   Hyponatremia   Anxiety with depression   TIA (transient ischemic attack)   Malnutrition of moderate degree   Hypoalbuminemia   Chronic diarrhea   Noninfectious gastroenteritis   Colitis   Principal problem Severe  colitis-patient with progressive diarrhea, 10 pound weight loss and progressive weakness mostly over the last several weeks.  It appears that she was empirically treated for C. difficile in her SNF but no C. difficile positive testing was available, and patient thinks it was never done -C. difficile and GI pathogen panel negative this hospitalization -Gastroenterology consulted, she underwent a colonoscopy on 1/19 with diffuse severe inflammation in the sigmoid, descending and transverse colon, status post biopsies.  Discussed with GI, concern for Crohn's disease, she was started on steroids.  Continue steroids, will waiting biopsy results, hopefully will have some answers today or tomorrow.  Active problems Hyponatremia -sodium stable 133 this morning  Essential hypertension-continue home medications as below  Hypothyroidism-continue Synthroid, recheck TSH within acceptable parameters  Hypomagnesemia-replete magnesium, stable at 1.8  Hypokalemia-replete again potassium today  Dysphagia -on a regular diet after speech evaluation.  She needs to work to fix her dentures  Anemia, of chronic disease-no bleeding, monitor  History of TIA-continue aspirin, statin  Depression/anxiety -continue home medications as below  Status post ex lap December 2023 - noted  Scheduled Meds:  (feeding supplement) PROSource Plus  30 mL Oral BID BM   aspirin EC  81 mg Oral Daily   buPROPion  75 mg Oral Daily   DULoxetine  60 mg Oral Daily   enoxaparin (LOVENOX) injection  40 mg Subcutaneous Q24H   feeding supplement (KATE FARMS STANDARD 1.4)  325 mL Oral BID BM   fiber supplement (BANATROL TF)  60 mL Oral BID   levothyroxine  75 mcg Oral QAC  breakfast   lisinopril  20 mg Oral Daily   methylPREDNISolone (SOLU-MEDROL) injection  60 mg Intravenous Daily   metoprolol succinate  50 mg Oral Daily   multivitamin with minerals  1 tablet Oral Daily   pantoprazole  40 mg Oral Daily   rosuvastatin  10 mg Oral  Daily   Continuous Infusions:   PRN Meds:.acetaminophen **OR** acetaminophen, ALPRAZolam, oxyCODONE  Current Outpatient Medications  Medication Instructions   acetaminophen (TYLENOL) 650 mg, Oral, Every 6 hours PRN   ALPRAZolam (XANAX) 0.5 mg, Oral, 2 times daily PRN, for anxiety   aspirin EC 81 mg, Oral, Daily, Swallow whole.   buPROPion (WELLBUTRIN) 75 mg, Oral, Daily   calcium-vitamin D (OSCAL WITH D) 500-200 MG-UNIT per tablet 1 tablet, Oral, Daily   DULoxetine (CYMBALTA) 60 mg, Oral, Daily   Glycerin-Hypromellose-PEG 400 (DRY EYE RELIEF DROPS OP) 1 drop, Both Eyes, 3 times daily PRN   levothyroxine (SYNTHROID) 75 mcg, Oral, Daily before breakfast   lidocaine (XYLOCAINE) 2 % solution 15 mLs, Mouth/Throat, Every  3 hours PRN   lisinopril (ZESTRIL) 20 mg, Oral, Daily   methylcellulose (CITRUCEL) oral powder 2 tablespoons daily   metoprolol succinate (TOPROL-XL) 50 mg, Oral, Daily, Take with or immediately following a meal. NEED ANNUAL VISIT FOR FURTHER REFILLS   Multiple Vitamins-Minerals (MULTIVITAMIN WITH MINERALS) tablet 1 tablet, Oral, Daily   omeprazole (PRILOSEC) 40 mg, Oral, Daily, NEED ANNUAL VISIT FOR FURTHER REFILLS   ondansetron (ZOFRAN-ODT) 4 mg, Oral, Every 4 hours PRN   oxycodone (OXY-IR) 5 mg, Oral, Every 6 hours PRN   OXYGEN 2 L, Inhalation, Continuous   rosuvastatin (CRESTOR) 10 mg, Oral, Daily   senna (SENOKOT) 8.6 mg, Oral, Daily   vancomycin (VANCOCIN) 125 mg, 4 times daily    Diet Orders (From admission, onward)     Start     Ordered   08/30/22 1228  Diet regular Room service appropriate? Yes; Fluid consistency: Thin  Diet effective now       Question Answer Comment  Room service appropriate? Yes   Fluid consistency: Thin      08/30/22 1227            DVT prophylaxis: enoxaparin (LOVENOX) injection 40 mg Start: 08/29/22 0800   Lab Results  Component Value Date   PLT 478 (H) 09/02/2022      Code Status: DNR  Family Communication: no  family at bedside, discussed with son Jori Moll over the phone  Status is: Inpatient, severity of illness   Level of care: Med-Surg  Consultants:  GI  Objective: Vitals:   09/01/22 1929 09/02/22 0326 09/02/22 0500 09/02/22 0840  BP: (!) 144/80 (!) 140/65  (!) 140/78  Pulse: 92 67  88  Resp: '16 17  16  '$ Temp: 98.1 F (36.7 C) 97.6 F (36.4 C)  98.8 F (37.1 C)  TempSrc: Oral Oral  Oral  SpO2: 97% 97%  97%  Weight:   61.2 kg   Height:        Intake/Output Summary (Last 24 hours) at 09/02/2022 0950 Last data filed at 09/02/2022 6314 Gross per 24 hour  Intake 360 ml  Output 2100 ml  Net -1740 ml    Wt Readings from Last 3 Encounters:  09/02/22 61.2 kg  08/08/22 62.6 kg  07/16/22 75.4 kg    Examination: Constitutional: NAD Eyes: lids and conjunctivae normal, no scleral icterus ENMT: mmm Neck: normal, supple Respiratory: clear to auscultation bilaterally, no wheezing, no crackles. Normal respiratory effort.  Cardiovascular: Regular rate  and rhythm, no murmurs / rubs / gallops. No LE edema. Abdomen: soft, no distention, no tenderness. Bowel sounds positive.  Skin: no rashes Neurologic: no focal deficits, equal strength   Data Reviewed: I have independently reviewed following labs and imaging studies   CBC Recent Labs  Lab 08/28/22 2042 08/29/22 0424 08/30/22 4034 08/31/22 0831 09/01/22 0442 09/02/22 0439  WBC 6.6 5.7 5.9 5.2 4.8 9.4  HGB 9.1* 8.8* 9.6* 9.9* 8.8* 9.3*  HCT 30.6* 28.5* 30.2* 30.5* 26.8* 28.7*  PLT 342 403* 440* 474* 443* 478*  MCV 98.7 97.3 94.4 92.1 92.4 93.2  MCH 29.4 30.0 30.0 29.9 30.3 30.2  MCHC 29.7* 30.9 31.8 32.5 32.8 32.4  RDW 16.5* 16.1* 16.2* 15.8* 15.7* 15.4  LYMPHSABS 4.0  --   --   --   --   --   MONOABS 0.8  --   --   --   --   --   EOSABS 0.1  --   --   --   --   --   BASOSABS 0.0  --   --   --   --   --      Recent Labs  Lab 08/28/22 2042 08/29/22 0424 08/30/22 0638 08/31/22 0831 09/01/22 0442 09/02/22 0439  NA  129* 132* 133* 131* 131* 133*  K 5.1 3.7 3.1* 4.0 3.9 3.4*  CL 99 105 105 101 102 101  CO2 19* 20* '22 24 22 25  '$ GLUCOSE 100* 107* 119* 155* 137* 123*  BUN 7* 5* <5* <5* 6* 8  CREATININE 0.52 0.43* 0.52 0.46 0.46 0.40*  CALCIUM 7.6* 7.3* 7.4* 7.8* 7.9* 7.8*  AST 40  --  '18 18 24  '$ --   ALT 13  --  '15 13 16  '$ --   ALKPHOS 53  --  52 51 50  --   BILITOT 1.2  --  0.5 0.4 0.2*  --   ALBUMIN 1.8*  --  1.7* 1.8* 1.7*  --   MG 1.8 1.6* 1.9 1.8 1.8 1.8  TSH  --  4.935*  --   --   --   --      ------------------------------------------------------------------------------------------------------------------ No results for input(s): "CHOL", "HDL", "LDLCALC", "TRIG", "CHOLHDL", "LDLDIRECT" in the last 72 hours.  Lab Results  Component Value Date   HGBA1C 5.7 (H) 04/30/2022   ------------------------------------------------------------------------------------------------------------------ No results for input(s): "TSH", "T4TOTAL", "T3FREE", "THYROIDAB" in the last 72 hours.  Invalid input(s): "FREET3"   Cardiac Enzymes No results for input(s): "CKMB", "TROPONINI", "MYOGLOBIN" in the last 168 hours.  Invalid input(s): "CK" ------------------------------------------------------------------------------------------------------------------ No results found for: "BNP"  CBG: No results for input(s): "GLUCAP" in the last 168 hours.  Recent Results (from the past 240 hour(s))  Gastrointestinal Panel by PCR , Stool     Status: None   Collection Time: 08/29/22  2:57 AM   Specimen: Stool  Result Value Ref Range Status   Campylobacter species NOT DETECTED NOT DETECTED Final   Plesimonas shigelloides NOT DETECTED NOT DETECTED Final   Salmonella species NOT DETECTED NOT DETECTED Final   Yersinia enterocolitica NOT DETECTED NOT DETECTED Final   Vibrio species NOT DETECTED NOT DETECTED Final   Vibrio cholerae NOT DETECTED NOT DETECTED Final   Enteroaggregative E coli (EAEC) NOT DETECTED NOT  DETECTED Final   Enteropathogenic E coli (EPEC) NOT DETECTED NOT DETECTED Final   Enterotoxigenic E coli (ETEC) NOT DETECTED NOT DETECTED Final   Shiga like toxin producing E coli (STEC) NOT DETECTED NOT DETECTED  Final   Shigella/Enteroinvasive E coli (EIEC) NOT DETECTED NOT DETECTED Final   Cryptosporidium NOT DETECTED NOT DETECTED Final   Cyclospora cayetanensis NOT DETECTED NOT DETECTED Final   Entamoeba histolytica NOT DETECTED NOT DETECTED Final   Giardia lamblia NOT DETECTED NOT DETECTED Final   Adenovirus F40/41 NOT DETECTED NOT DETECTED Final   Astrovirus NOT DETECTED NOT DETECTED Final   Norovirus GI/GII NOT DETECTED NOT DETECTED Final   Rotavirus A NOT DETECTED NOT DETECTED Final   Sapovirus (I, II, IV, and V) NOT DETECTED NOT DETECTED Final    Comment: Performed at [facility], [address] [place], [place] [address]  C Difficile Quick Screen w PCR reflex     Status: None   Collection Time: 08/29/22  2:57 AM   Specimen: Stool  Result Value Ref Range Status   C Diff antigen NEGATIVE NEGATIVE Final   C Diff toxin NEGATIVE NEGATIVE Final   C Diff interpretation No C. difficile detected.  Final    Comment: Performed at [facility], [place] [address] [place], [place] [address]     Radiology Studies: No results found.   Marzetta Board, MD, PhD [facility]  Between 7 am - 7 pm I am available, please contact me via Amion (for emergencies) or Securechat (non urgent messages)  Between 7 pm - 7 am I am not available, please contact night coverage MD/APP via Amion

## 2022-09-02 NOTE — Care Management Important Message (Signed)
Important Message  Patient Details  Name: [name] MRN: [id number] Date of Birth: 1940/12/11   Medicare Important Message Given:  Yes     Orbie Pyo 09/02/2022, 2:51 PM

## 2022-09-02 NOTE — Progress Notes (Signed)
CSW spoke with patient at bedside to discuss her discharge plan. Patient states she came to the hospital from [facility] after being there for approximately 30 days. Patient reports she was paying privately for SNF after being cut by insurance. Patient is adamant that she does not want to return to SNF due to the poor experience she had at [facility]. Patient reports she is scheduled to move into [facility] on 09/11/22. Patient reports she desires to remain hospitalized until she is able to move into the apartment at [facility].  CSW informed MD of information.  Madilyn Fireman, MSW, LCSW Transitions of Care  Clinical Social Worker II [phone]

## 2022-09-02 NOTE — Care Management Important Message (Signed)
Important Message  Patient Details  Name: [name] MRN: [id number] Date of Birth: [birth year]   Medicare Important Message Given:  Yes     Orbie Pyo 09/02/2022, 2:52 PM

## 2022-09-02 NOTE — Progress Notes (Signed)
Nutrition Follow-up  DOCUMENTATION CODES:   Not applicable  INTERVENTION:  - Continue Banatrol BID.   - Continue [employer] 1.4 PO BID, each supplement provides 455 kcal and 20 grams protein.  - Continue Prosource Plus BID.   NUTRITION DIAGNOSIS:   Severe Malnutrition related to acute illness as evidenced by energy intake < 75% for > 7 days, percent weight loss, mild fat depletion, mild muscle depletion.  GOAL:   Patient will meet greater than or equal to 90% of their needs  MONITOR:   PO intake, Supplement acceptance  REASON FOR ASSESSMENT:   Consult Assessment of nutrition requirement/status  ASSESSMENT:   82 y.o. female admits related to diarrhea from SNF. PMH includes: cancer, esophageal stricture, fibromyalgia, GERD, HTN, pneumonia, PUD, PVD. Pt is currently receiving medical management related to diarrhea with dehydration.  Meds reviewed:  banatrol BID,  solu-medrol, MVI. Labs reviewed: Na low, K low.   Pt reports that her appetite is improving and she has been tolerating more food. RD added [employer] protein shake BID. Pt states that she enjoys them. Pt also reports that she has been taking Banatrol supplement as well and that Bms are improving. RD will continue to monitor PO intakes.   Diet Order:   Diet Order             Diet regular Room service appropriate? Yes; Fluid consistency: Thin  Diet effective now                   EDUCATION NEEDS:   Not appropriate for education at this time  Skin:  Skin Assessment: Reviewed RN Assessment  Last BM:  1/22 - type 7  Height:   Ht Readings from Last 1 Encounters:  08/30/22 '5\' 2"'$  (1.575 m)    Weight:   Wt Readings from Last 1 Encounters:  09/02/22 61.2 kg    Ideal Body Weight:     BMI:  Body mass index is 24.68 kg/m.  Estimated Nutritional Needs:   Kcal:  1505-1810 kcals  Protein:  75-90 gm  Fluid:  >/= 1.5 L  Thalia Bloodgood, RD, LDN, CNSC.

## 2022-09-02 NOTE — Progress Notes (Addendum)
Daily Rounding Note  09/02/2022, 11:52 AM  LOS: 3 days   SUBJECTIVE:   Chief complaint:  colitis, watery/nonbloody diarrhea.    Still has loose, watery stool but just 1 yesterday over 24 hours.  No abdominal pain.  Tolerating solid food.  Optimistic now that diarrhea is improving.  OBJECTIVE:         Vital signs in last 24 hours:    Temp:  [97.6 F (36.4 C)-98.8 F (37.1 C)] 98.8 F (37.1 C) (01/22 0840) Pulse Rate:  [67-92] 88 (01/22 0840) Resp:  [16-18] 16 (01/22 0840) BP: (127-144)/(65-80) 140/78 (01/22 0840) SpO2:  [96 %-97 %] 97 % (01/22 0840) Weight:  [61.2 kg] 61.2 kg (01/22 0500) Last BM Date : 09/02/22 Filed Weights   08/30/22 0953 09/01/22 0500 09/02/22 0500  Weight: 60.3 kg 60.6 kg 61.2 kg   General: Looks well, appears younger than stated age.  No distress. Heart: RRR. Chest: No labored breathing or cough Abdomen: Soft, nontender, nondistended, active bowel sounds Extremities: No CCE. Neuro/Psych: Loquacious.  Somewhat pressured speech which is appropriate and informative.  No gross deficits.  Intake/Output from previous day: 01/21 0701 - 01/22 0700 In: -  Out: 1600 [Urine:1600]  Intake/Output this shift: Total I/O In: 360 [P.O.:360] Out: 1000 [Urine:1000]  Lab Results: Recent Labs    08/31/22 0831 09/01/22 0442 09/02/22 0439  WBC 5.2 4.8 9.4  HGB 9.9* 8.8* 9.3*  HCT 30.5* 26.8* 28.7*  PLT 474* 443* 478*   BMET Recent Labs    08/31/22 0831 09/01/22 0442 09/02/22 0439  NA 131* 131* 133*  K 4.0 3.9 3.4*  CL 101 102 101  CO2 '24 22 25  '$ GLUCOSE 155* 137* 123*  BUN <5* 6* 8  CREATININE 0.46 0.46 0.40*  CALCIUM 7.8* 7.9* 7.8*   LFT Recent Labs    08/31/22 0831 09/01/22 0442  PROT 4.5* 4.3*  ALBUMIN 1.8* 1.7*  AST 18 24  ALT 13 16  ALKPHOS 51 50  BILITOT 0.4 0.2*   PT/INR No results for input(s): "LABPROT", "INR" in the last 72 hours. Hepatitis Panel No results for  input(s): "HEPBSAG", "HCVAB", "HEPAIGM", "HEPBIGM" in the last 72 hours.  Studies/Results: No results found.  Scheduled Meds:  (feeding supplement) PROSource Plus  30 mL Oral BID BM   aspirin EC  81 mg Oral Daily   buPROPion  75 mg Oral Daily   DULoxetine  60 mg Oral Daily   enoxaparin (LOVENOX) injection  40 mg Subcutaneous Q24H   feeding supplement (KATE FARMS STANDARD 1.4)  325 mL Oral BID BM   fiber supplement (BANATROL TF)  60 mL Oral BID   levothyroxine  75 mcg Oral QAC breakfast   lisinopril  20 mg Oral Daily   methylPREDNISolone (SOLU-MEDROL) injection  60 mg Intravenous Daily   metoprolol succinate  50 mg Oral Daily   multivitamin with minerals  1 tablet Oral Daily   pantoprazole  40 mg Oral Daily   rosuvastatin  10 mg Oral Daily   Continuous Infusions: PRN Meds:.acetaminophen **OR** acetaminophen, ALPRAZolam, oxyCODONE   ASSESMENT:   Severe diarrhea associated with malnutrition/hypoalbuminemia.  Stool studies negative for infectious etiology.  Has not had fecal leukocytes or fecal elastase assayed. 08/30/2022 colonoscopy with severe colitis extending sigmoid to transverse colon, Dr. Havery Moros did not intubate right colon due to severity of left colon changes.  Biopsies obtained, path is pending. RO viral etiology vs IBD.  Empiric Solu-Medrol initiated 1/19, so day  4.    Imbler anemia.  Hgb stable, improved: 8.8... 9.3.    Hyponatremia.  Hypokalemia.    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   PLAN     Await pathology, continue Solumedrol.  Continue regular diet.    Azucena Freed  09/02/2022, 11:52 AM Phone [phone]   Attending physician's note   I have taken a history, reviewed the chart and examined the patient. I performed a substantive portion of this encounter, including complete performance of at least one of the key components, in conjunction with the APP. I agree with the APP's note, impression and recommendations.    Diarrhea is improving, 1 loose BM since this  AM  Colon biopsies consistent with Crohn's disease, await CMV  Will transition to oral prednisone '30mg'$  BID and taper by 10 mg every 5 days Patient will benefit from biologics (Rinvoq ) to achieve remission, will request pharmacy/GI office to investigate benefits  Protein Calorie Malnutrition, continue regular diet, consider adding ensure in between meals twice daily  Outpatient GI follow up with Dr Henrene Pastor or APP with in 2 weeks   The patient was provided an opportunity to ask questions and all were answered. The patient agreed with the plan and demonstrated an understanding of the instructions.   Damaris Hippo , MD [phone]

## 2022-09-03 ENCOUNTER — Ambulatory Visit: Payer: Medicare Other | Admitting: Gastroenterology

## 2022-09-03 DIAGNOSIS — R197 Diarrhea, unspecified: Secondary | ICD-10-CM | POA: Diagnosis not present

## 2022-09-03 DIAGNOSIS — I10 Essential (primary) hypertension: Secondary | ICD-10-CM | POA: Diagnosis not present

## 2022-09-03 DIAGNOSIS — K50118 Crohn's disease of large intestine with other complication: Secondary | ICD-10-CM | POA: Diagnosis not present

## 2022-09-03 DIAGNOSIS — E8809 Other disorders of plasma-protein metabolism, not elsewhere classified: Secondary | ICD-10-CM | POA: Diagnosis not present

## 2022-09-03 LAB — MAGNESIUM: Magnesium: 1.9 mg/dL (ref 1.7–2.4)

## 2022-09-03 LAB — CBC
HCT: 34.2 % — ABNORMAL LOW (ref 36.0–46.0)
Hemoglobin: 11.2 g/dL — ABNORMAL LOW (ref 12.0–15.0)
MCH: 30.5 pg (ref 26.0–34.0)
MCHC: 32.7 g/dL (ref 30.0–36.0)
MCV: 93.2 fL (ref 80.0–100.0)
Platelets: 550 10*3/uL — ABNORMAL HIGH (ref 150–400)
RBC: 3.67 MIL/uL — ABNORMAL LOW (ref 3.87–5.11)
RDW: 15.7 % — ABNORMAL HIGH (ref 11.5–15.5)
WBC: 6.8 10*3/uL (ref 4.0–10.5)
nRBC: 0.3 % — ABNORMAL HIGH (ref 0.0–0.2)

## 2022-09-03 LAB — BASIC METABOLIC PANEL
Anion gap: 5 (ref 5–15)
BUN: 8 mg/dL (ref 8–23)
CO2: 27 mmol/L (ref 22–32)
Calcium: 8.3 mg/dL — ABNORMAL LOW (ref 8.9–10.3)
Chloride: 99 mmol/L (ref 98–111)
Creatinine, Ser: 0.53 mg/dL (ref 0.44–1.00)
GFR, Estimated: >60 mL/min (ref >60)
Glucose, Bld: 109 mg/dL — ABNORMAL HIGH (ref 70–99)
Potassium: 3.9 mmol/L (ref 3.5–5.1)
Sodium: 131 mmol/L — ABNORMAL LOW (ref 135–145)

## 2022-09-03 MED ORDER — PREDNISONE 20 MG PO TABS
25.0000 mg | ORAL_TABLET | Freq: Two times a day (BID) | ORAL | Status: AC
  Administered 2022-09-06 – 2022-09-10 (×10): 25 mg via ORAL
  Filled 2022-09-03 (×11): qty 1

## 2022-09-03 MED ORDER — PREDNISONE 20 MG PO TABS
20.0000 mg | ORAL_TABLET | Freq: Two times a day (BID) | ORAL | Status: DC
  Administered 2022-09-11: 20 mg via ORAL
  Filled 2022-09-03: qty 1

## 2022-09-03 MED ORDER — PREDNISONE 20 MG PO TABS
30.0000 mg | ORAL_TABLET | Freq: Two times a day (BID) | ORAL | Status: AC
  Administered 2022-09-03 – 2022-09-05 (×5): 30 mg via ORAL
  Filled 2022-09-03 (×5): qty 1

## 2022-09-03 NOTE — Progress Notes (Signed)
Physical Therapy Treatment Patient Details Name: [name] MRN: [id number] DOB: 08-12-1941 Today's Date: 09/03/2022   History of Present Illness Patient is a 82 yo female presenting to the ED with persistent diarrhea on 08/29/22. C-diff negative. Patient with recent hospitalization for septic shock / ischemic colitis. Exploratory laparotomy overall unrevealing. PMHx: constipation, fibromyalgia, anxiety, insomnia, GERD, AV block, HTN.    PT Comments    Pt progressing slowly with mobility, ambulated short room distance x2 requiring seated rest break to recover fatigue. Pt noted to have bilat foot clearance deficits, and states her feet are "numb". Overall pt requiring min-mod physical assist for mobility at this time, PT to continue to follow.     Recommendations for follow up therapy are one component of a multi-disciplinary discharge planning process, led by the attending physician.  Recommendations may be updated based on patient status, additional functional criteria and insurance authorization.  Follow Up Recommendations  Other (comment) (ALF, PT services there) Can patient physically be transported by private vehicle: Yes   Assistance Recommended at Discharge Frequent or constant Supervision/Assistance  Patient can return home with the following A lot of help with walking and/or transfers;A little help with bathing/dressing/bathroom;Assistance with cooking/housework;Help with stairs or ramp for entrance;Assist for transportation   Equipment Recommendations  Rolling walker (2 wheels);BSC/3in1;Wheelchair (measurements PT);Wheelchair cushion (measurements PT)    Recommendations for Other Services       Precautions / Restrictions Precautions Precautions: Fall Restrictions Weight Bearing Restrictions: No     Mobility  Bed Mobility Overal bed mobility: Needs Assistance             General bed mobility comments: up in chair    Transfers Overall transfer level: Needs  assistance Equipment used: Rolling walker (2 wheels) Transfers: Sit to/from Stand Sit to Stand: Mod assist           General transfer comment: assist for rise, steady, correcting posterior bias. STS x3, from EOB x1, from chair x2.    Ambulation/Gait Ambulation/Gait assistance: Min assist Gait Distance (Feet): 25 Feet (+10) Assistive device: Rolling walker (2 wheels) Gait Pattern/deviations: Decreased stride length, Decreased dorsiflexion - right, Decreased dorsiflexion - left, Trunk flexed, Leaning posteriorly, Narrow base of support, Step-through pattern Gait velocity: decr     General Gait Details: cues for upright posture and increasing foot clearance, assist to steady   Stairs             Wheelchair Mobility    Modified Rankin (Stroke Patients Only)       Balance Overall balance assessment: Needs assistance Sitting-balance support: No upper extremity supported, Feet supported Sitting balance-Leahy Scale: Good   Postural control: Posterior lean Standing balance support: Bilateral upper extremity supported, During functional activity, Reliant on assistive device for balance Standing balance-Leahy Scale: Poor Standing balance comment: Reliant on RW and up to modA for standing balance, posterior lean noted.                            Cognition Arousal/Alertness: Awake/alert Behavior During Therapy: Flat affect Overall Cognitive Status: Within Functional Limits for tasks assessed                                          Exercises General Exercises - Lower Extremity Long Arc Quad: AROM, Both, 15 reps, Seated Hip Flexion/Marching: AROM, Both, 10 reps, Seated Toe  Raises: AROM, Both, 10 reps, Seated Heel Raises: AROM, Both, 10 reps, Seated    General Comments General comments (skin integrity, edema, etc.): VSS on RA      Pertinent Vitals/Pain Pain Assessment Pain Assessment: Faces Faces Pain Scale: Hurts even more Pain  Location: abdomen Pain Descriptors / Indicators: Cramping Pain Intervention(s): Limited activity within patient's tolerance, Monitored during session, Patient requesting pain meds-RN notified, Repositioned    Home Living                          Prior Function            PT Goals (current goals can now be found in the care plan section) Acute Rehab PT Goals Patient Stated Goal: to walk PT Goal Formulation: With patient Time For Goal Achievement: 09/14/22 Potential to Achieve Goals: Good Progress towards PT goals: Progressing toward goals    Frequency    Min 3X/week      PT Plan Current plan remains appropriate    Co-evaluation              AM-PAC PT "6 Clicks" Mobility   Outcome Measure  Help needed turning from your back to your side while in a flat bed without using bedrails?: A Little Help needed moving from lying on your back to sitting on the side of a flat bed without using bedrails?: A Little Help needed moving to and from a bed to a chair (including a wheelchair)?: A Little Help needed standing up from a chair using your arms (e.g., wheelchair or bedside chair)?: A Little Help needed to walk in hospital room?: A Lot Help needed climbing 3-5 steps with a railing? : A Lot 6 Click Score: 16    End of Session   Activity Tolerance: Patient tolerated treatment well Patient left: in chair;with call bell/phone within reach;with chair alarm set Nurse Communication: Mobility status PT Visit Diagnosis: Unsteadiness on feet (R26.81);Other abnormalities of gait and mobility (R26.89);Muscle weakness (generalized) (M62.81);Difficulty in walking, not elsewhere classified (R26.2)     Time: 0932-3557 PT Time Calculation (min) (ACUTE ONLY): 18 min  Charges:  $Therapeutic Activity: 8-22 mins                     Stacie Glaze, PT DPT Acute Rehabilitation Services Pager [phone]  Office [phone]    Roxine Caddy E Ruffin Pyo 09/03/2022, 5:29 PM

## 2022-09-03 NOTE — Progress Notes (Signed)
PROGRESS NOTE  [name] EHU:[id number] DOB: 11-28-1940 DOA: 08/28/2022 PCP: Reynold Bowen, MD   LOS: 4 days   Brief Narrative / Interim history: 82 year old female with history of HTN, hypothyroidism, depression/anxiety who comes to the hospital with diarrhea.  She was recently hospitalized at the end of 2023 with abdominal pain, septic shock, elevated lactic acid.  She was felt to have large intestinal ischemia and was taken to OR for an ex lap by Dr. Dema Severin, without any evidence of malperfusion of any of her intestine.  Was felt to have some sort of infectious colitis.  She was discharged to [facility], but in the last 2 to 3 weeks she has developed significant watery diarrhea.  She was treated for C. difficile colitis with p.o. vancomycin QID, and apparently finished antibiotics about 4 days prior to this admission.  She is not sure whether C. difficile was ever sent or she was treated empirically.  Despite treatment, she has been having persistent diarrhea with 5 watery bowel movements per day, and in the last week she has also noticed nighttime stool incontinence while asleep and wakes up soiled.  This is never happened to her, and prior to her surgery she has been actually constipated for most of her life.  She denies any blood in her stools, she does report occasional cramping with nighttime bowel movements.  She reports progressive weakness, failure to progress with PT, weight loss.  GI consulted, underwent a colonoscopy which showed findings concerning for Crohn's disease.  She was started on steroids  Subjective / 24h Interval events: Her diarrhea is improving.  Yesterday she only had 1 loose bowel movement.  She is eating better, appetite is increased.  No abdominal pain, no nausea or vomiting.  Assesement and Plan: Principal Problem:   Diarrhea Active Problems:   Fibromyalgia   Essential hypertension   Hyponatremia   Anxiety with depression   TIA (transient ischemic attack)    Severe protein-calorie malnutrition (HCC)   Hypoalbuminemia   Chronic diarrhea   Noninfectious gastroenteritis   CC (Crohn's colitis), other complication ([place])   Principal problem Severe colitis, concern for Crohn's-patient with progressive diarrhea, 10 pound weight loss and progressive weakness mostly over the last several weeks.  It appears that she was empirically treated for C. difficile in her SNF but no C. difficile positive testing was available, and patient thinks it was never done -C. difficile and GI pathogen panel negative this hospitalization -Gastroenterology consulted, she underwent a colonoscopy on 1/19 with diffuse severe inflammation in the sigmoid, descending and transverse colon, status post biopsies.  Discussed with GI, concern for Crohn's disease, she was started on steroids.  Continue steroids, transition to oral prednisone per GI -Diarrhea improving, appetite is improving  Active problems Hyponatremia -sodium stable 131 this  Essential hypertension-continue home medications as below, blood pressure overall acceptable  Hypothyroidism-continue Synthroid, recheck TSH within acceptable parameters  Hypomagnesemia-replete magnesium, stable at 1.9 this morning  Hypokalemia-potassium normalized to 3.9 today  Dysphagia -on a regular diet after speech evaluation.  She needs to work to fix her dentures  Anemia, of chronic disease-no bleeding, monitor  History of TIA-continue aspirin, statin  Depression/anxiety -continue home medications as below  Status post ex lap December 2023 - noted  Disposition-PT recommends SNF.  She had a really bad experience at [facility] and is adamant that she will refuse to return there.  She is scheduled to move into ALF on 1/31, she has no family in town and is  unsafe to be home alone at this point  Scheduled Meds:  (feeding supplement) PROSource Plus  30 mL Oral BID BM   aspirin EC  81 mg Oral Daily   buPROPion  75 mg Oral Daily    DULoxetine  60 mg Oral Daily   enoxaparin (LOVENOX) injection  40 mg Subcutaneous Q24H   feeding supplement (KATE FARMS STANDARD 1.4)  325 mL Oral BID BM   fiber supplement (BANATROL TF)  60 mL Oral BID   levothyroxine  75 mcg Oral QAC breakfast   lisinopril  20 mg Oral Daily   metoprolol succinate  50 mg Oral Daily   multivitamin with minerals  1 tablet Oral Daily   pantoprazole  40 mg Oral Daily   predniSONE  30 mg Oral BID WC   rosuvastatin  10 mg Oral Daily   Continuous Infusions:   PRN Meds:.acetaminophen **OR** acetaminophen, ALPRAZolam, oxyCODONE  Current Outpatient Medications  Medication Instructions   acetaminophen (TYLENOL) 650 mg, Oral, Every 6 hours PRN   ALPRAZolam (XANAX) 0.5 mg, Oral, 2 times daily PRN, for anxiety   aspirin EC 81 mg, Oral, Daily, Swallow whole.   buPROPion (WELLBUTRIN) 75 mg, Oral, Daily   calcium-vitamin D (OSCAL WITH D) 500-200 MG-UNIT per tablet 1 tablet, Oral, Daily   DULoxetine (CYMBALTA) 60 mg, Oral, Daily   Glycerin-Hypromellose-PEG 400 (DRY EYE RELIEF DROPS OP) 1 drop, Both Eyes, 3 times daily PRN   levothyroxine (SYNTHROID) 75 mcg, Oral, Daily before breakfast   lidocaine (XYLOCAINE) 2 % solution 15 mLs, Mouth/Throat, Every  3 hours PRN   lisinopril (ZESTRIL) 20 mg, Oral, Daily   methylcellulose (CITRUCEL) oral powder 2 tablespoons daily   metoprolol succinate (TOPROL-XL) 50 mg, Oral, Daily, Take with or immediately following a meal. NEED ANNUAL VISIT FOR FURTHER REFILLS   Multiple Vitamins-Minerals (MULTIVITAMIN WITH MINERALS) tablet 1 tablet, Oral, Daily   omeprazole (PRILOSEC) 40 mg, Oral, Daily, NEED ANNUAL VISIT FOR FURTHER REFILLS   ondansetron (ZOFRAN-ODT) 4 mg, Oral, Every 4 hours PRN   oxycodone (OXY-IR) 5 mg, Oral, Every 6 hours PRN   OXYGEN 2 L, Inhalation, Continuous   rosuvastatin (CRESTOR) 10 mg, Oral, Daily   senna (SENOKOT) 8.6 mg, Oral, Daily   vancomycin (VANCOCIN) 125 mg, 4 times daily    Diet Orders (From  admission, onward)     Start     Ordered   08/30/22 1228  Diet regular Room service appropriate? Yes; Fluid consistency: Thin  Diet effective now       Question Answer Comment  Room service appropriate? Yes   Fluid consistency: Thin      08/30/22 1227            DVT prophylaxis: enoxaparin (LOVENOX) injection 40 mg Start: 08/29/22 0800   Lab Results  Component Value Date   PLT 550 (H) 09/03/2022      Code Status: DNR  Family Communication: no family at bedside, discussed with son Jori Moll over the phone  Status is: Inpatient, severity of illness   Level of care: Med-Surg  Consultants:  GI  Objective: Vitals:   09/02/22 1537 09/02/22 2012 09/03/22 0331 09/03/22 0801  BP: 132/75 136/76 (!) 162/76 (!) 149/88  Pulse: 95 97 64 (!) 106  Resp: '16 17 16 18  '$ Temp: 97.9 F (36.6 C) 98.2 F (36.8 C) 98.7 F (37.1 C) 99 F (37.2 C)  TempSrc: Oral Oral Oral Oral  SpO2: 93% 96% 97% 97%  Weight:      Height:  Intake/Output Summary (Last 24 hours) at 09/03/2022 1022 Last data filed at 09/03/2022 0520 Gross per 24 hour  Intake 592 ml  Output 1450 ml  Net -858 ml    Wt Readings from Last 3 Encounters:  09/02/22 61.2 kg  08/08/22 62.6 kg  07/16/22 75.4 kg    Examination: Constitutional: NAD Eyes: lids and conjunctivae normal, no scleral icterus ENMT: mmm Neck: normal, supple Respiratory: clear to auscultation bilaterally, no wheezing, no crackles. Normal respiratory effort.  Cardiovascular: Regular rate and rhythm, no murmurs / rubs / gallops. No LE edema. Abdomen: soft, no distention, no tenderness. Bowel sounds positive.  Skin: no rashes Neurologic: no focal deficits, equal strength  Data Reviewed: I have independently reviewed following labs and imaging studies   CBC Recent Labs  Lab 08/28/22 2042 08/29/22 0424 08/30/22 4098 08/31/22 0831 09/01/22 0442 09/02/22 0439 09/03/22 0816  WBC 6.6   < > 5.9 5.2 4.8 9.4 6.8  HGB 9.1*   < > 9.6* 9.9*  8.8* 9.3* 11.2*  HCT 30.6*   < > 30.2* 30.5* 26.8* 28.7* 34.2*  PLT 342   < > 440* 474* 443* 478* 550*  MCV 98.7   < > 94.4 92.1 92.4 93.2 93.2  MCH 29.4   < > 30.0 29.9 30.3 30.2 30.5  MCHC 29.7*   < > 31.8 32.5 32.8 32.4 32.7  RDW 16.5*   < > 16.2* 15.8* 15.7* 15.4 15.7*  LYMPHSABS 4.0  --   --   --   --   --   --   MONOABS 0.8  --   --   --   --   --   --   EOSABS 0.1  --   --   --   --   --   --   BASOSABS 0.0  --   --   --   --   --   --    < > = values in this interval not displayed.     Recent Labs  Lab 08/28/22 2042 08/29/22 0424 08/30/22 1191 08/31/22 0831 09/01/22 0442 09/02/22 0439 09/03/22 0816  NA 129* 132* 133* 131* 131* 133* 131*  K 5.1 3.7 3.1* 4.0 3.9 3.4* 3.9  CL 99 105 105 101 102 101 99  CO2 19* 20* '22 24 22 25 27  '$ GLUCOSE 100* 107* 119* 155* 137* 123* 109*  BUN 7* 5* <5* <5* 6* 8 8  CREATININE 0.52 0.43* 0.52 0.46 0.46 0.40* 0.53  CALCIUM 7.6* 7.3* 7.4* 7.8* 7.9* 7.8* 8.3*  AST 40  --  '18 18 24  '$ --   --   ALT 13  --  '15 13 16  '$ --   --   ALKPHOS 53  --  52 51 50  --   --   BILITOT 1.2  --  0.5 0.4 0.2*  --   --   ALBUMIN 1.8*  --  1.7* 1.8* 1.7*  --   --   MG 1.8 1.6* 1.9 1.8 1.8 1.8 1.9  TSH  --  4.935*  --   --   --   --   --      ------------------------------------------------------------------------------------------------------------------ No results for input(s): "CHOL", "HDL", "LDLCALC", "TRIG", "CHOLHDL", "LDLDIRECT" in the last 72 hours.  Lab Results  Component Value Date   HGBA1C 5.7 (H) 04/30/2022   ------------------------------------------------------------------------------------------------------------------ No results for input(s): "TSH", "T4TOTAL", "T3FREE", "THYROIDAB" in the last 72 hours.  Invalid input(s): "FREET3"   Cardiac Enzymes No results for  input(s): "CKMB", "TROPONINI", "MYOGLOBIN" in the last 168 hours.  Invalid input(s):  "CK" ------------------------------------------------------------------------------------------------------------------ No results found for: "BNP"  CBG: No results for input(s): "GLUCAP" in the last 168 hours.  Recent Results (from the past 240 hour(s))  Gastrointestinal Panel by PCR , Stool     Status: None   Collection Time: 08/29/22  2:57 AM   Specimen: Stool  Result Value Ref Range Status   Campylobacter species NOT DETECTED NOT DETECTED Final   Plesimonas shigelloides NOT DETECTED NOT DETECTED Final   Salmonella species NOT DETECTED NOT DETECTED Final   Yersinia enterocolitica NOT DETECTED NOT DETECTED Final   Vibrio species NOT DETECTED NOT DETECTED Final   Vibrio cholerae NOT DETECTED NOT DETECTED Final   Enteroaggregative E coli (EAEC) NOT DETECTED NOT DETECTED Final   Enteropathogenic E coli (EPEC) NOT DETECTED NOT DETECTED Final   Enterotoxigenic E coli (ETEC) NOT DETECTED NOT DETECTED Final   Shiga like toxin producing E coli (STEC) NOT DETECTED NOT DETECTED Final   Shigella/Enteroinvasive E coli (EIEC) NOT DETECTED NOT DETECTED Final   Cryptosporidium NOT DETECTED NOT DETECTED Final   Cyclospora cayetanensis NOT DETECTED NOT DETECTED Final   Entamoeba histolytica NOT DETECTED NOT DETECTED Final   Giardia lamblia NOT DETECTED NOT DETECTED Final   Adenovirus F40/41 NOT DETECTED NOT DETECTED Final   Astrovirus NOT DETECTED NOT DETECTED Final   Norovirus GI/GII NOT DETECTED NOT DETECTED Final   Rotavirus A NOT DETECTED NOT DETECTED Final   Sapovirus (I, II, IV, and V) NOT DETECTED NOT DETECTED Final    Comment: Performed at [facility], [address] [place], [place] [address]  C Difficile Quick Screen w PCR reflex     Status: None   Collection Time: 08/29/22  2:57 AM   Specimen: Stool  Result Value Ref Range Status   C Diff antigen NEGATIVE NEGATIVE Final   C Diff toxin NEGATIVE NEGATIVE Final   C Diff interpretation No C. difficile detected.  Final     Comment: Performed at [facility], [place] [address], [place], [place] [address]     Radiology Studies: No results found.   Marzetta Board, MD, PhD [facility]  Between 7 am - 7 pm I am available, please contact me via Amion (for emergencies) or Securechat (non urgent messages)  Between 7 pm - 7 am I am not available, please contact night coverage MD/APP via Amion

## 2022-09-03 NOTE — Progress Notes (Addendum)
Daily Rounding Note  09/03/2022, 11:09 AM  LOS: 4 days   SUBJECTIVE:   Chief complaint:      1 watery stool yesterday, 1 watery stool today.  No bleeding.  Some cramping yesterday evening, resolved.  Overall feels better.  Very anxious regarding where she will be living after discharge.  Apparently she is set up to enter [facility] assisted living on January 30 but needs rehab stay for physical debilitation up until then if not longer.  OBJECTIVE:         Vital signs in last 24 hours:    Temp:  [97.9 F (36.6 C)-99 F (37.2 C)] 99 F (37.2 C) (01/23 0801) Pulse Rate:  [64-106] 106 (01/23 0801) Resp:  [16-18] 18 (01/23 0801) BP: (132-162)/(75-88) 149/88 (01/23 0801) SpO2:  [93 %-97 %] 97 % (01/23 0801) Last BM Date : 09/02/22 Filed Weights   08/30/22 0953 09/01/22 0500 09/02/22 0500  Weight: 60.3 kg 60.6 kg 61.2 kg   General: Anxious, slightly ill but overall looks well. Heart: RRR. Chest: Clear Abdomen: Soft.  Minimal tenderness.  Not distended.  Active bowel sounds Extremities: No CCE. Neuro/Psych: Oriented x 3.  No gross deficits.  Fluid speech.  Anxious.  Intake/Output from previous day: 01/22 0701 - 01/23 0700 In: 952 [P.O.:952] Out: 2450 [Urine:2450]  Intake/Output this shift: No intake/output data recorded.  Lab Results: Recent Labs    09/01/22 0442 09/02/22 0439 09/03/22 0816  WBC 4.8 9.4 6.8  HGB 8.8* 9.3* 11.2*  HCT 26.8* 28.7* 34.2*  PLT 443* 478* 550*   BMET Recent Labs    09/01/22 0442 09/02/22 0439 09/03/22 0816  NA 131* 133* 131*  K 3.9 3.4* 3.9  CL 102 101 99  CO2 '22 25 27  '$ GLUCOSE 137* 123* 109*  BUN 6* 8 8  CREATININE 0.46 0.40* 0.53  CALCIUM 7.9* 7.8* 8.3*   LFT Recent Labs    09/01/22 0442  PROT 4.3*  ALBUMIN 1.7*  AST 24  ALT 16  ALKPHOS 50  BILITOT 0.2*   PT/INR No results for input(s): "LABPROT", "INR" in the last 72 hours. Hepatitis Panel No  results for input(s): "HEPBSAG", "HCVAB", "HEPAIGM", "HEPBIGM" in the last 72 hours.  Studies/Results: No results found.  Scheduled Meds:  (feeding supplement) PROSource Plus  30 mL Oral BID BM   aspirin EC  81 mg Oral Daily   buPROPion  75 mg Oral Daily   DULoxetine  60 mg Oral Daily   enoxaparin (LOVENOX) injection  40 mg Subcutaneous Q24H   feeding supplement (KATE FARMS STANDARD 1.4)  325 mL Oral BID BM   fiber supplement (BANATROL TF)  60 mL Oral BID   levothyroxine  75 mcg Oral QAC breakfast   lisinopril  20 mg Oral Daily   metoprolol succinate  50 mg Oral Daily   multivitamin with minerals  1 tablet Oral Daily   pantoprazole  40 mg Oral Daily   predniSONE  30 mg Oral BID WC   rosuvastatin  10 mg Oral Daily   Continuous Infusions: PRN Meds:.acetaminophen **OR** acetaminophen, ALPRAZolam, oxyCODONE   ASSESMENT:     Colitis.   Ospitalization 06/2022 with septic shock/ischemic colitis underwent exploratory laparotomy for concern of bowel perforation.  This showed no perforation but confirmed severe constipation, partial kinking without obstruction of sigmoid.  No evidence for ischemia/necrosis.  Stool studies negative for infectious etiology.  Has not had fecal leukocytes or fecal elastase assayed.  08/30/2022  colonoscopy with severe colitis extending sigmoid to transverse colon, Dr. Havery Moros did not intubate right colon due to severity of left colon changes.   Path:Ulcer with granulation tissue and exudate.  Colonic fragments with focally altered crypt architecture.  Differential diagnosis includes Crohn's.  No granulomas identified.   Solu-Medrol initiated 1/19-1/22.  Now day 1 on prednisone taper.  Diarrhea frequency significantly improved.  But still has watery stool    PCM  Anxiety.   PLAN     prednisone '30mg'$  BID and taper by 10 mg every 5 days.  Will discuss adding Questran or conservative Imodium with Dr. Silverio Decamp.  May be best to just wait and let her bowels  resolve the watery stool on their own.  Pharmacy/GI office to investigate possible benefits to assist with obtaining biologic (Rinvoq)    Messaged GI office to set up fup ~ 2 weeks w Dr Henrene Pastor or APP.    GI signing off.  Please call if reinvolvement needed or questions     [name]  09/03/2022, 11:09 AM Phone [phone]   Attending physician's note   I have taken a history, reviewed the chart and examined the patient. I performed a substantive portion of this encounter, including complete performance of at least one of the key components, in conjunction with the APP. I agree with the APP's note, impression and recommendations.    Severe colitis with ulceration, biopsies suggestive of Crohn's disease Transitioned to oral prednisone 30 mg twice daily, decrease by 10 mg every 5 days until reach 10 mg daily X 5 days then decrease to 5 mg daily for additional 10 days and stop Patient is hesitant to start immunosuppressive therapy, can rediscuss as outpatient.   Given her age may opt to manage conservatively especially if her symptoms resolve with the steroid taper  Follow-up in GI office on discharge  GI will sign off, available if have any questions   The patient was provided an opportunity to ask questions and all were answered. The patient agreed with the plan and demonstrated an understanding of the instructions.   Damaris Hippo , MD [phone]

## 2022-09-03 NOTE — Progress Notes (Signed)
Occupational Therapy Treatment Patient Details Name: [name] MRN: [id number] DOB: 1941/06/04 Today's Date: 09/03/2022   History of present illness Patient is a 82 yo female presenting to the ED with persistent diarrhea on 08/29/22. C-diff negative. Patient with recent hospitalization for septic shock / ischemic colitis. Exploratory laparotomy overall unrevealing. PMHx: constipation, fibromyalgia, anxiety, insomnia, GERD, AV block, HTN.   OT comments  Pt progressing toward goals this session, needing mod A for LB dressing and simulated toilet transfer. Pt min guard A for bed mobility, and demo's good balance at EOB to perform ADL. Pt with posterior lean in standing, cues to weight shift forward, increased reliance on RW support. Pt presenting with impairments listed below, will follow acutely. Continue to recommend SNF at d/c.   Recommendations for follow up therapy are one component of a multi-disciplinary discharge planning process, led by the attending physician.  Recommendations may be updated based on patient status, additional functional criteria and insurance authorization.    Follow Up Recommendations  Skilled nursing-short term rehab (<3 hours/day) (Patient does not want to return to [facility], has a slot at [place] on 09/14/22.)     Assistance Recommended at Discharge Intermittent Supervision/Assistance  Patient can return home with the following  A lot of help with walking and/or transfers;A lot of help with bathing/dressing/bathroom;Assistance with cooking/housework;Assist for transportation;Help with stairs or ramp for entrance   Equipment Recommendations  Other (comment) (defer)    Recommendations for Other Services      Precautions / Restrictions Precautions Precautions: Fall Restrictions Weight Bearing Restrictions: No       Mobility Bed Mobility Overal bed mobility: Needs Assistance Bed Mobility: Supine to Sit     Supine to sit: Min guard      General bed mobility comments: increased time    Transfers Overall transfer level: Needs assistance Equipment used: Rolling walker (2 wheels) Transfers: Sit to/from Stand, Bed to chair/wheelchair/BSC Sit to Stand: Mod assist     Step pivot transfers: Mod assist     General transfer comment: posterior lean in standing, cues to weight shift, pt stating " I have to lean forward"     Balance Overall balance assessment: Needs assistance Sitting-balance support: No upper extremity supported, Feet supported Sitting balance-Leahy Scale: Good   Postural control: Posterior lean Standing balance support: Bilateral upper extremity supported, During functional activity, Reliant on assistive device for balance Standing balance-Leahy Scale: Poor Standing balance comment: Reliant on RW and up to modA for standing balance, posterior lean noted.                           ADL either performed or assessed with clinical judgement   ADL Overall ADL's : Needs assistance/impaired                     Lower Body Dressing: Moderate assistance Lower Body Dressing Details (indicate cue type and reason): donning socks/shoes at [employer] Transfer: Moderate assistance;BSC/3in1;Stand-pivot;Rolling walker (2 wheels)           Functional mobility during ADLs: Moderate assistance;Rolling walker (2 wheels);Cueing for safety;Cueing for sequencing      Extremity/Trunk Assessment Upper Extremity Assessment Upper Extremity Assessment: Generalized weakness   Lower Extremity Assessment Lower Extremity Assessment: Defer to PT evaluation        Vision   Vision Assessment?: No apparent visual deficits   Perception Perception Perception: Not tested   Praxis Praxis Praxis: Not tested  Cognition Arousal/Alertness: Awake/alert Behavior During Therapy: WFL for tasks assessed/performed Overall Cognitive Status: Within Functional Limits for tasks assessed                                  General Comments: reports she is motivated to mobilize more, wants to get OOB daily        Exercises      Shoulder Instructions       General Comments VSS on RA    Pertinent Vitals/ Pain       Pain Assessment Pain Assessment: Faces Pain Score: 2  Faces Pain Scale: Hurts a little bit Pain Location: abdomen Pain Descriptors / Indicators: Cramping Pain Intervention(s): Monitored during session, Repositioned  Home Living                                          Prior Functioning/Environment              Frequency  Min 2X/week        Progress Toward Goals  OT Goals(current goals can now be found in the care plan section)  Progress towards OT goals: Progressing toward goals  Acute Rehab OT Goals Patient Stated Goal: to get OOB OT Goal Formulation: With patient Time For Goal Achievement: 09/13/22 Potential to Achieve Goals: Fair ADL Goals Pt Will Perform Lower Body Bathing: with min assist;sitting/lateral leans;sit to/from stand Pt Will Perform Lower Body Dressing: with min assist;sitting/lateral leans;sit to/from stand Pt Will Transfer to Toilet: with min assist;bedside commode;stand pivot transfer Pt Will Perform Toileting - Clothing Manipulation and hygiene: with min assist;sitting/lateral leans;sit to/from stand Additional ADL Goal #1: Patient will be able to stand EOB and complete functional task for 2-4 minutes without need for seated rest break as precursor to functional mobility and increased independence.  Plan Discharge plan remains appropriate;Frequency remains appropriate    Co-evaluation                 AM-PAC OT "6 Clicks" Daily Activity     Outcome Measure   Help from another person eating meals?: A Little Help from another person taking care of personal grooming?: A Little Help from another person toileting, which includes using toliet, bedpan, or urinal?: A Lot Help from another person  bathing (including washing, rinsing, drying)?: A Lot Help from another person to put on and taking off regular upper body clothing?: A Little Help from another person to put on and taking off regular lower body clothing?: A Lot 6 Click Score: 15    End of Session Equipment Utilized During Treatment: Gait belt;Rolling walker (2 wheels)  OT Visit Diagnosis: Unsteadiness on feet (R26.81);Other abnormalities of gait and mobility (R26.89);Muscle weakness (generalized) (M62.81);Adult, failure to thrive (R62.7)   Activity Tolerance Patient limited by fatigue   Patient Left in chair;with call bell/phone within reach;with chair alarm set   Nurse Communication Mobility status        Time: 1447-1510 OT Time Calculation (min): 23 min  Charges: OT General Charges $OT Visit: 1 Visit OT Treatments $Self Care/Home Management : 8-22 mins $Therapeutic Activity: 8-22 mins  Renaye Rakers, OTD, OTR/L SecureChat Preferred Acute Rehab [phone] - [place] 09/03/2022, 4:04 PM

## 2022-09-04 DIAGNOSIS — R197 Diarrhea, unspecified: Secondary | ICD-10-CM | POA: Diagnosis not present

## 2022-09-04 DIAGNOSIS — E8809 Other disorders of plasma-protein metabolism, not elsewhere classified: Secondary | ICD-10-CM | POA: Diagnosis not present

## 2022-09-04 LAB — COMPREHENSIVE METABOLIC PANEL
ALT: 35 U/L (ref 0–44)
AST: 25 U/L (ref 15–41)
Albumin: 2.3 g/dL — ABNORMAL LOW (ref 3.5–5.0)
Alkaline Phosphatase: 59 U/L (ref 38–126)
Anion gap: 7 (ref 5–15)
BUN: 10 mg/dL (ref 8–23)
CO2: 28 mmol/L (ref 22–32)
Calcium: 8.2 mg/dL — ABNORMAL LOW (ref 8.9–10.3)
Chloride: 98 mmol/L (ref 98–111)
Creatinine, Ser: 0.55 mg/dL (ref 0.44–1.00)
GFR, Estimated: >60 mL/min (ref >60)
Glucose, Bld: 95 mg/dL (ref 70–99)
Potassium: 3.6 mmol/L (ref 3.5–5.1)
Sodium: 133 mmol/L — ABNORMAL LOW (ref 135–145)
Total Bilirubin: 0.2 mg/dL — ABNORMAL LOW (ref 0.3–1.2)
Total Protein: 5.1 g/dL — ABNORMAL LOW (ref 6.5–8.1)

## 2022-09-04 LAB — CBC
HCT: 36.2 % (ref 36.0–46.0)
Hemoglobin: 11.2 g/dL — ABNORMAL LOW (ref 12.0–15.0)
MCH: 29.5 pg (ref 26.0–34.0)
MCHC: 30.9 g/dL (ref 30.0–36.0)
MCV: 95.3 fL (ref 80.0–100.0)
Platelets: 556 10*3/uL — ABNORMAL HIGH (ref 150–400)
RBC: 3.8 MIL/uL — ABNORMAL LOW (ref 3.87–5.11)
RDW: 15.9 % — ABNORMAL HIGH (ref 11.5–15.5)
WBC: 6.9 10*3/uL (ref 4.0–10.5)
nRBC: 0 % (ref 0.0–0.2)

## 2022-09-04 LAB — MAGNESIUM: Magnesium: 2.1 mg/dL (ref 1.7–2.4)

## 2022-09-04 NOTE — Progress Notes (Signed)
Progress Note   Patient: [name] PJK:[id number] DOB: 18-Sep-1940 DOA: 08/28/2022     5 DOS: the patient was seen and examined on 09/04/2022   Brief hospital course: 82 year old female with history of HTN, hypothyroidism, depression/anxiety who comes to the hospital with diarrhea.  She was recently hospitalized at the end of 2023 with abdominal pain, septic shock, elevated lactic acid.  She was felt to have large intestinal ischemia and was taken to OR for an ex lap by Dr. Dema Severin, without any evidence of malperfusion of any of her intestine.  Was felt to have some sort of infectious colitis.  She was discharged to [place], but in the last 2 to 3 weeks she has developed significant watery diarrhea.  She was treated for C. difficile colitis with p.o. vancomycin QID, and apparently finished antibiotics about 4 days prior to this admission.  She is not sure whether C. difficile was ever sent or she was treated empirically.  Despite treatment, she has been having persistent diarrhea with 5 watery bowel movements per day, and in the last week she has also noticed nighttime stool incontinence while asleep and wakes up soiled.  This is never happened to her, and prior to her surgery she has been actually constipated for most of her life.  She denies any blood in her stools, she does report occasional cramping with nighttime bowel movements.  She reports progressive weakness, failure to progress with PT, weight loss.  GI consulted, underwent a colonoscopy which showed findings concerning for Crohn's disease.  She was started on steroids   Assessment and Plan: Severe colitis, concern for Crohn's-patient with progressive diarrhea, 10 pound weight loss and progressive weakness mostly over the last several weeks.  It appears that she was empirically treated for C. difficile in her SNF but no C. difficile positive testing was available, and patient thinks it was never done -C. difficile and GI pathogen panel  negative this hospitalization -Gastroenterology consulted, she underwent a colonoscopy on 1/19 with diffuse severe inflammation in the sigmoid, descending and transverse colon, status post biopsies.  Dr. Cruzita Lederer discussed with GI, concern for Crohn's disease, she was started on steroids.  Continue steroids, transition to oral prednisone per GI -Continue to encourage PO as tolerated   Active problems Hyponatremia -sodium improved to 133   Essential hypertension-continue home medications as below -BP stable   Hypothyroidism-continue Synthroid -TSH 4.935   Hypomagnesemia-corrected   Hypokalemia-K remains within normal limits   Dysphagia -on a regular diet after speech evaluation.  She needs to work to fix her dentures   Anemia, of chronic disease-no bleeding, monitor   History of TIA-continue aspirin, statin   Depression/anxiety -continue home medications as below   Status post ex lap December 2023 - noted      Subjective: Very eager to get stronger with therapy  Physical Exam: Vitals:   09/03/22 2142 09/04/22 0300 09/04/22 0500 09/04/22 0748  BP: 120/74 126/63  128/74  Pulse: 77 72  (!) 101  Resp: '17 18  18  '$ Temp: 98.7 F (37.1 C) 98.2 F (36.8 C)  97.9 F (36.6 C)  TempSrc:  Oral  Oral  SpO2: 98% 96%  99%  Weight:   61.3 kg   Height:       General exam: Awake, laying in bed, in nad Respiratory system: Normal respiratory effort, no wheezing Cardiovascular system: regular rate, s1, s2 Gastrointestinal system: Soft, nondistended, positive BS Central nervous system: CN2-12 grossly intact, strength intact Extremities: Perfused, no  clubbing Skin: Normal skin turgor, no notable skin lesions seen Psychiatry: Mood normal // no visual hallucinations   Data Reviewed:  Labs reviewed: Na 133, K 3.6, Cr 0.55, Hgb 11.2  Family Communication: Pt in room, family not at bedside  Disposition: Status is: Inpatient Remains inpatient appropriate because: Severity of illness   Planned Discharge Destination:  [facility]    Author: Marylu Lund, MD 09/04/2022 1:58 PM  For on call review [email or web address].

## 2022-09-04 NOTE — Hospital Course (Signed)
82 year old female with history of HTN, hypothyroidism, depression/anxiety who comes to the hospital with diarrhea.  She was recently hospitalized at the end of 2023 with abdominal pain, septic shock, elevated lactic acid.  She was felt to have large intestinal ischemia and was taken to OR for an ex lap by Dr. Dema Severin, without any evidence of malperfusion of any of her intestine.  Was felt to have some sort of infectious colitis.  She was discharged to [place], but in the last 2 to 3 weeks she has developed significant watery diarrhea.  She was treated for C. difficile colitis with p.o. vancomycin QID, and apparently finished antibiotics about 4 days prior to this admission.  She is not sure whether C. difficile was ever sent or she was treated empirically.  Despite treatment, she has been having persistent diarrhea with 5 watery bowel movements per day, and in the last week she has also noticed nighttime stool incontinence while asleep and wakes up soiled.  This is never happened to her, and prior to her surgery she has been actually constipated for most of her life.  She denies any blood in her stools, she does report occasional cramping with nighttime bowel movements.  She reports progressive weakness, failure to progress with PT, weight loss.  GI consulted, underwent a colonoscopy which showed findings concerning for Crohn's disease.  She was started on steroids

## 2022-09-04 NOTE — Progress Notes (Signed)
Mobility Specialist Progress Note   09/04/22 1035  Mobility  Activity Ambulated with assistance in hallway;Transferred from bed to chair  Level of Assistance Contact guard assist, steadying assist  Assistive Device Front wheel walker  Distance Ambulated (ft) 320 ft  Range of Motion/Exercises Active;All extremities  Activity Response Tolerated well   Patient received in supine and eager to participate. Presented with awareness and understanding of her current deficits and gait deviations. Session focused on increasing hip flexor strength to improve foot clearance, and increase leg strength and stability when standing and weight shifting. Required minimal HHA for bed mobility and stood with min A + cues for hand/foot placement. Initially needed min A for balance with standing secondary to mild instability that improved with cues to widen BOS. Was able to march in place and weight shift with min guard and without LOB.  Ambulated min guard in hallway with slow steady gait. Required standing breaks x2 secondary to fatigue. Returned to room without complaint or incident. Was left in recliner with all needs met, call bell in reach.   [place] Yavonne Kiss, BS EXP Mobility Specialist Please contact via SecureChat or Rehab office at [phone]

## 2022-09-04 NOTE — Progress Notes (Addendum)
CSW spoke with Cyril Mourning at [facility] who states she will discuss the possibility of allowing the patient to move in earlier than her planned date (09/11/22) with the patient's son to determine if that is an option. Cyril Mourning to return call to [place] with updates.  Madilyn Fireman, MSW, LCSW Transitions of Care  Clinical Social Worker II [phone]

## 2022-09-05 ENCOUNTER — Telehealth: Payer: Self-pay | Admitting: Neurology

## 2022-09-05 DIAGNOSIS — A0472 Enterocolitis due to Clostridium difficile, not specified as recurrent: Secondary | ICD-10-CM

## 2022-09-05 DIAGNOSIS — R197 Diarrhea, unspecified: Secondary | ICD-10-CM | POA: Diagnosis not present

## 2022-09-05 DIAGNOSIS — E8809 Other disorders of plasma-protein metabolism, not elsewhere classified: Secondary | ICD-10-CM | POA: Diagnosis not present

## 2022-09-05 MED ORDER — CALCIUM CARBONATE ANTACID 500 MG PO CHEW
1.0000 | CHEWABLE_TABLET | Freq: Every day | ORAL | Status: DC | PRN
  Administered 2022-09-05 – 2022-09-11 (×9): 200 mg via ORAL
  Filled 2022-09-05 (×10): qty 1

## 2022-09-05 NOTE — Progress Notes (Signed)
TRH night cross cover note:   I was notified by RN of the patient's request for medication for indigestion.  I subsequently placed order for Tums Qdaily prn for indigestion.     Babs Bertin, DO Hospitalist

## 2022-09-05 NOTE — Progress Notes (Signed)
Mobility Specialist: Progress Note   09/05/22 1731  Mobility  Activity Ambulated with assistance in hallway  Level of Assistance Minimal assist, patient does 75% or more  Assistive Device Front wheel walker  Distance Ambulated (ft) 270 ft  Activity Response Tolerated well  Mobility Referral Yes  $Mobility charge 1 Mobility   Pt received in the bed and agreeable to mobility. Mod I with bed mobility and minA to stand. C/o Rt foot weakness during ambulation. No c/o pain, dizziness, or SOB. Pt back to the bed after session with call bell and phone at her side.   [place] Lucas Exline Mobility Specialist Please contact via SecureChat or Rehab office at [phone]

## 2022-09-05 NOTE — Progress Notes (Signed)
Physical Therapy Treatment Patient Details Name: [name] MRN: [id number] DOB: 02-18-1941 Today's Date: 09/05/2022   History of Present Illness 82 yo female admitted 08/28/22 with persistent diarrhea. C-diff negative. 1/19 colonoscopy with concern for Chron's disease. PMHx: constipation, fibromyalgia, anxiety, insomnia, GERD, AV block, HTN.    PT Comments    Pt anxious regarding bil LE weakness she reports has developed since admission. 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 However, pt able to normalize gait during practice when distracted talking and demonstrate appropriate strength against gravity with HEP. Pt benefits from reassurance and education for gait with RW as well as continued HEP to strengthen bil LE to ease transitions and function.     Recommendations for follow up therapy are one component of a multi-disciplinary discharge planning process, led by the attending physician.  Recommendations may be updated based on patient status, additional functional criteria and insurance authorization.  Follow Up Recommendations  Home health PT (at ALF) Can patient physically be transported by private vehicle: Yes   Assistance Recommended at Discharge Frequent or constant Supervision/Assistance  Patient can return home with the following A lot of help with walking and/or transfers;A little help with bathing/dressing/bathroom;Assistance with cooking/housework;Help with stairs or ramp for entrance;Assist for transportation   Equipment Recommendations  Rolling walker (2 wheels);BSC/3in1;Wheelchair (measurements PT);Wheelchair cushion (measurements PT)    Recommendations for Other Services       Precautions / Restrictions Precautions Precautions: Fall     Mobility  Bed Mobility Overal bed mobility: Modified Independent Bed Mobility: Supine to Sit     Supine to sit: HOB elevated     General bed mobility comments: HOB 20 degrees with use of rail     Transfers Overall transfer level: Needs assistance   Transfers: Sit to/from Stand Sit to Stand: Min assist           General transfer comment: min assist to rise and steady due to posterior bias with min-mod assist initially for standing balance with cues for anterior translation    Ambulation/Gait Ambulation/Gait assistance: Min assist, Mod assist, Min guard Gait Distance (Feet): 150 Feet Assistive device: Rolling walker (2 wheels) Gait Pattern/deviations: Step-to pattern, Decreased stance time - right   Gait velocity interpretation: <1.8 ft/sec, indicate of risk for recurrent falls   General Gait Details: initial 72' of gait pt with frequent posterior LOB requiring mod assist to steady and correct. 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 With distraction, talking and cues for step through pattern pt able to normalize gait and walk additional 150' with improved foot clearance and minguard assist   Stairs             Wheelchair Mobility    Modified Rankin (Stroke Patients Only)       Balance Overall balance assessment: Needs assistance Sitting-balance support: No upper extremity supported, Feet supported Sitting balance-Leahy Scale: Fair Sitting balance - Comments: able to reach toward foot but needs min assist to don shoes   Standing balance support: Bilateral upper extremity supported, During functional activity, Reliant on assistive device for balance Standing balance-Leahy Scale: Poor Standing balance comment: initial mod assist with RW due to posterior bias with progression to minguard                            Cognition Arousal/Alertness: Awake/alert Behavior During Therapy: Anxious Overall  Cognitive Status: Impaired/Different from baseline Area of Impairment: Safety/judgement                         Safety/Judgement: Decreased awareness of deficits      General Comments: pt convinced she has bil foot drop despite good strength and with distraction is able to normalize gait. anxious about having other things going wrong but benefits from reassurance, encouragement and distraction        Exercises General Exercises - Lower Extremity Long Arc Quad: AROM, 15 reps, Seated, Both Hip Flexion/Marching: AROM, Both, Seated, 20 reps Toe Raises: AROM, Both, 20 reps, Seated    General Comments        Pertinent Vitals/Pain Pain Assessment Pain Assessment: No/denies pain    Home Living                          Prior Function            PT Goals (current goals can now be found in the care plan section) Progress towards PT goals: Progressing toward goals    Frequency    Min 3X/week      PT Plan Current plan remains appropriate    Co-evaluation              AM-PAC PT "6 Clicks" Mobility   Outcome Measure  Help needed turning from your back to your side while in a flat bed without using bedrails?: A Little Help needed moving from lying on your back to sitting on the side of a flat bed without using bedrails?: A Little Help needed moving to and from a bed to a chair (including a wheelchair)?: A Little Help needed standing up from a chair using your arms (e.g., wheelchair or bedside chair)?: A Little Help needed to walk in hospital room?: A Lot Help needed climbing 3-5 steps with a railing? : A Lot 6 Click Score: 16    End of Session   Activity Tolerance: Patient tolerated treatment well Patient left: in chair;with call bell/phone within reach;with chair alarm set Nurse Communication: Mobility status PT Visit Diagnosis: Unsteadiness on feet (R26.81);Other abnormalities of gait and mobility (R26.89);Muscle weakness (generalized) (M62.81);Difficulty in walking, not elsewhere classified (R26.2)     Time: 8453-6468 PT Time Calculation (min) (ACUTE ONLY): 29 min  Charges:  $Gait Training: 8-22  mins $Therapeutic Activity: 8-22 mins                     Bayard Males, PT Acute Rehabilitation Services Office: [place] 09/05/2022, 11:57 AM

## 2022-09-05 NOTE — Telephone Encounter (Signed)
LVM and sent mychart msg informing pt of r/s needed for 2/22 appt- MD out.

## 2022-09-05 NOTE — Progress Notes (Signed)
Progress Note   Patient: [name] [id number]:[id number] DOB: 1940-11-24 DOA: 08/28/2022     6 DOS: the patient was seen and examined on 09/05/2022   Brief hospital course: 82 year old female with history of HTN, hypothyroidism, depression/anxiety who comes to the hospital with diarrhea.  She was recently hospitalized at the end of 2023 with abdominal pain, septic shock, elevated lactic acid.  She was felt to have large intestinal ischemia and was taken to OR for an ex lap by Dr. Dema Severin, without any evidence of malperfusion of any of her intestine.  Was felt to have some sort of infectious colitis.  She was discharged to [facility], but in the last 2 to 3 weeks she has developed significant watery diarrhea.  She was treated for C. difficile colitis with p.o. vancomycin QID, and apparently finished antibiotics about 4 days prior to this admission.  She is not sure whether C. difficile was ever sent or she was treated empirically.  Despite treatment, she has been having persistent diarrhea with 5 watery bowel movements per day, and in the last week she has also noticed nighttime stool incontinence while asleep and wakes up soiled.  This is never happened to her, and prior to her surgery she has been actually constipated for most of her life.  She denies any blood in her stools, she does report occasional cramping with nighttime bowel movements.  She reports progressive weakness, failure to progress with PT, weight loss.  GI consulted, underwent a colonoscopy which showed findings concerning for Crohn's disease.  She was started on steroids   Assessment and Plan: Severe colitis, concern for Crohn's-patient with progressive diarrhea, 10 pound weight loss and progressive weakness mostly over the last several weeks.  It appears that she was empirically treated for C. difficile in her SNF but no C. difficile positive testing was available, and patient thinks it was never done -C. difficile and GI pathogen panel  negative this hospitalization -Gastroenterology consulted, she underwent a colonoscopy on 1/19 with diffuse severe inflammation in the sigmoid, descending and transverse colon, status post biopsies.  Dr. Cruzita Lederer discussed with GI, concern for Crohn's disease, she was started on steroids.  Continue steroids, on oral prednisone per GI -Continue to encourage PO as tolerated   Active problems Hyponatremia -sodium improved to 133   Essential hypertension-continue home medications as below -BP stable   Hypothyroidism-continue Synthroid -TSH 4.935   Hypomagnesemia-corrected   Hypokalemia-K remains within normal limits   Dysphagia -on a regular diet after speech evaluation.  She needs to work to fix her dentures   Anemia, of chronic disease-no bleeding, monitor   History of TIA-continue aspirin, statin   Depression/anxiety -continue home medications as below   Status post ex lap December 2023 - noted, seems stable      Subjective: Remains motivated to work with therapy an increase activity  Physical Exam: Vitals:   09/05/22 0359 09/05/22 0500 09/05/22 0742 09/05/22 0742  BP: 127/85   133/76  Pulse: 98   94  Resp: 16     Temp: (!) 97.4 F (36.3 C)  98 F (36.7 C) 98 F (36.7 C)  TempSrc: Oral  Oral   SpO2: 98%   100%  Weight:  61.2 kg    Height:       General exam: Conversant, in no acute distress Respiratory system: normal chest rise, clear, no audible wheezing Cardiovascular system: regular rhythm, s1-s2 Gastrointestinal system: Nondistended, nontender, pos BS Central nervous system: No seizures, no tremors Extremities:  No cyanosis, no joint deformities Skin: No rashes, no pallor Psychiatry: Affect normal // no auditory hallucinations   Data Reviewed:  There are no new results to review at this time.  Family Communication: Pt in room, family not at bedside  Disposition: Status is: Inpatient Remains inpatient appropriate because: Severity of illness  Planned  Discharge Destination:  [facility]    Author: Marylu Lund, MD 09/05/2022 3:19 PM  For on call review [email or web address].

## 2022-09-06 DIAGNOSIS — E8809 Other disorders of plasma-protein metabolism, not elsewhere classified: Secondary | ICD-10-CM | POA: Diagnosis not present

## 2022-09-06 DIAGNOSIS — R197 Diarrhea, unspecified: Secondary | ICD-10-CM | POA: Diagnosis not present

## 2022-09-06 LAB — CBC
HCT: 33.1 % — ABNORMAL LOW (ref 36.0–46.0)
Hemoglobin: 10.7 g/dL — ABNORMAL LOW (ref 12.0–15.0)
MCH: 30.5 pg (ref 26.0–34.0)
MCHC: 32.3 g/dL (ref 30.0–36.0)
MCV: 94.3 fL (ref 80.0–100.0)
Platelets: 556 10*3/uL — ABNORMAL HIGH (ref 150–400)
RBC: 3.51 MIL/uL — ABNORMAL LOW (ref 3.87–5.11)
RDW: 16.3 % — ABNORMAL HIGH (ref 11.5–15.5)
WBC: 9 10*3/uL (ref 4.0–10.5)
nRBC: 0 % (ref 0.0–0.2)

## 2022-09-06 LAB — COMPREHENSIVE METABOLIC PANEL
ALT: 27 U/L (ref 0–44)
AST: 20 U/L (ref 15–41)
Albumin: 2.3 g/dL — ABNORMAL LOW (ref 3.5–5.0)
Alkaline Phosphatase: 53 U/L (ref 38–126)
Anion gap: 10 (ref 5–15)
BUN: 11 mg/dL (ref 8–23)
CO2: 27 mmol/L (ref 22–32)
Calcium: 8.6 mg/dL — ABNORMAL LOW (ref 8.9–10.3)
Chloride: 97 mmol/L — ABNORMAL LOW (ref 98–111)
Creatinine, Ser: 0.63 mg/dL (ref 0.44–1.00)
GFR, Estimated: >60 mL/min (ref >60)
Glucose, Bld: 114 mg/dL — ABNORMAL HIGH (ref 70–99)
Potassium: 4.2 mmol/L (ref 3.5–5.1)
Sodium: 134 mmol/L — ABNORMAL LOW (ref 135–145)
Total Bilirubin: 0.4 mg/dL (ref 0.3–1.2)
Total Protein: 5.1 g/dL — ABNORMAL LOW (ref 6.5–8.1)

## 2022-09-06 MED ORDER — MAGIC MOUTHWASH
5.0000 mL | Freq: Four times a day (QID) | ORAL | Status: DC | PRN
  Administered 2022-09-06 – 2022-09-11 (×5): 5 mL via ORAL
  Filled 2022-09-06 (×6): qty 5

## 2022-09-06 NOTE — Progress Notes (Signed)
Physical Therapy Treatment Patient Details Name: [name] MRN: [id number] DOB: [birth year] Today's Date: 09/06/2022   History of Present Illness 82 yo female admitted 08/28/22 with persistent diarrhea. C-diff negative. 1/19 colonoscopy with concern for Chron's disease. PMHx: constipation, fibromyalgia, anxiety, insomnia, GERD, AV block, HTN.    PT Comments    Pt with improved awareness, stability and gait today. Pt continues to demonstrate posterior bias with initial standing with improvement during repeated trials. Pt benefit from distraction in gait to maximize distance and foot clearance. Will continue to follow.     Recommendations for follow up therapy are one component of a multi-disciplinary discharge planning process, led by the attending physician.  Recommendations may be updated based on patient status, additional functional criteria and insurance authorization.  Follow Up Recommendations  Home health PT Can patient physically be transported by private vehicle: Yes   Assistance Recommended at Discharge Frequent or constant Supervision/Assistance  Patient can return home with the following A little help with walking and/or transfers;A little help with bathing/dressing/bathroom;Assist for transportation;Assistance with cooking/housework   Equipment Recommendations  Rolling walker (2 wheels)    Recommendations for Other Services       Precautions / Restrictions Precautions Precautions: Fall     Mobility  Bed Mobility Overal bed mobility: Modified Independent                  Transfers Overall transfer level: Needs assistance   Transfers: Sit to/from Stand Sit to Stand: Min assist           General transfer comment: min assist to rise and steady due to posterior bias with min assist to steady and rise. Pt performed 10 sit to stand trials from chair with cues for nose over toes, foot placement and hand placement. With progression from min to minguard  assist.    Ambulation/Gait Ambulation/Gait assistance: Min guard Gait Distance (Feet): 450 Feet Assistive device: Rolling walker (2 wheels) Gait Pattern/deviations: Step-through pattern, Decreased stride length   Gait velocity interpretation: 1.31 - 2.62 ft/sec, indicative of limited community ambulator   General Gait Details: pt with improved confidence and stride with gait and reliance on use of RW. Pt with some noted Rt ankle inversion with gait with cues for midline and correction. pt self-determining distance   Stairs             Wheelchair Mobility    Modified Rankin (Stroke Patients Only)       Balance Overall balance assessment: Needs assistance   Sitting balance-Leahy Scale: Fair Sitting balance - Comments: able to don shoes EOB   Standing balance support: Bilateral upper extremity supported, During functional activity, Reliant on assistive device for balance Standing balance-Leahy Scale: Poor Standing balance comment: min assist due to posterior bias                            Cognition Arousal/Alertness: Awake/alert Behavior During Therapy: Anxious Overall Cognitive Status: Impaired/Different from baseline Area of Impairment: Safety/judgement                         Safety/Judgement: Decreased awareness of deficits     General Comments: pt with improved mobility but lacks awarenes of posterior bias and rt ankle inversion at times with gait        Exercises General Exercises - Lower Extremity Long Arc Quad: AROM, Both, Seated, 20 reps Toe Raises: AROM, Both, 20 reps, Seated  General Comments        Pertinent Vitals/Pain Pain Assessment Pain Assessment: No/denies pain    Home Living                          Prior Function            PT Goals (current goals can now be found in the care plan section) Progress towards PT goals: Progressing toward goals    Frequency    Min 3X/week      PT Plan  Current plan remains appropriate    Co-evaluation              AM-PAC PT "6 Clicks" Mobility   Outcome Measure  Help needed turning from your back to your side while in a flat bed without using bedrails?: None Help needed moving from lying on your back to sitting on the side of a flat bed without using bedrails?: A Little Help needed moving to and from a bed to a chair (including a wheelchair)?: A Little Help needed standing up from a chair using your arms (e.g., wheelchair or bedside chair)?: A Little Help needed to walk in hospital room?: A Little Help needed climbing 3-5 steps with a railing? : A Lot 6 Click Score: 18    End of Session Equipment Utilized During Treatment: Gait belt Activity Tolerance: Patient tolerated treatment well Patient left: in chair;with call bell/phone within reach;with chair alarm set Nurse Communication: Mobility status PT Visit Diagnosis: Unsteadiness on feet (R26.81);Other abnormalities of gait and mobility (R26.89);Muscle weakness (generalized) (M62.81);Difficulty in walking, not elsewhere classified (R26.2)     Time: 7858-8502 PT Time Calculation (min) (ACUTE ONLY): 31 min  Charges:  $Gait Training: 8-22 mins $Therapeutic Activity: 8-22 mins                     Bayard Males, PT Acute Rehabilitation Services Office: [place] 09/06/2022, 10:56 AM

## 2022-09-06 NOTE — Progress Notes (Signed)
Occupational Therapy Treatment Patient Details Name: [name] MRN: [id number] DOB: Jan 13, 1941 Today's Date: 09/06/2022   History of present illness 82 yo female admitted 08/28/22 with persistent diarrhea. C-diff negative. 1/19 colonoscopy with concern for Chron's disease. PMHx: constipation, fibromyalgia, anxiety, insomnia, GERD, AV block, HTN.   OT comments  Patient up in recliner and eager to participate. Patient demonstrated good gains this treatment session with patient able to ambulate to sink and perform grooming tasks, tolerating 3+ minutes of standing. Patient required seated rest break following and increased time to rest before ambulating to EOB. Patient able to donn and doff shoes with min assist, progressing from mod assist. Patient would benefit from continued OT treatment to increase independence and safety with functional transfers and self care tasks. Acute OT to continue to follow to address LB dressing and standing tolerance.    Recommendations for follow up therapy are one component of a multi-disciplinary discharge planning process, led by the attending physician.  Recommendations may be updated based on patient status, additional functional criteria and insurance authorization.    Follow Up Recommendations  Skilled nursing-short term rehab (<3 hours/day) (Patient does not want to return to [facility], has a slot at [place] on 09/14/22)     Assistance Recommended at Discharge Intermittent Supervision/Assistance  Patient can return home with the following  Assistance with cooking/housework;Assist for transportation;Help with stairs or ramp for entrance;A little help with walking and/or transfers;A little help with bathing/dressing/bathroom   Equipment Recommendations  Other (comment) (defer)    Recommendations for Other Services      Precautions / Restrictions Precautions Precautions: Fall Restrictions Weight Bearing Restrictions: No       Mobility  Bed Mobility Overal bed mobility: Modified Independent Bed Mobility: Sit to Supine       Sit to supine: Modified independent (Device/Increase time)   General bed mobility comments: no assistance needed to return to supine    Transfers Overall transfer level: Needs assistance Equipment used: Rolling walker (2 wheels) Transfers: Sit to/from Stand Sit to Stand: Min assist           General transfer comment: min assist to power up and occasional min assist for walker management     Balance Overall balance assessment: Needs assistance Sitting-balance support: No upper extremity supported, Feet supported Sitting balance-Leahy Scale: Fair Sitting balance - Comments: able to doff shoes seated on EOB   Standing balance support: Single extremity supported, Bilateral upper extremity supported, During functional activity Standing balance-Leahy Scale: Poor Standing balance comment: stood at sink for grooming tasks with min guard to min assist for balance                           ADL either performed or assessed with clinical judgement   ADL Overall ADL's : Needs assistance/impaired     Grooming: Wash/dry hands;Wash/dry face;Oral care;Minimal assistance;Standing Grooming Details (indicate cue type and reason): stood at sink for 3+ minutes to perform grooming tasks             Lower Body Dressing: Minimal assistance;Sitting/lateral leans Lower Body Dressing Details (indicate cue type and reason): donning socks/shoes at [employer] Transfer: Minimal assistance;Ambulation Toilet Transfer Details (indicate cue type and reason): simulated to recliner           General ADL Comments: able to stand for grooming but required seated rest break following and increased time to rest    Extremity/Trunk Assessment  Vision       Perception     Praxis      Cognition Arousal/Alertness: Awake/alert Behavior During Therapy: Anxious Overall  Cognitive Status: Impaired/Different from baseline Area of Impairment: Safety/judgement                         Safety/Judgement: Decreased awareness of deficits              Exercises      Shoulder Instructions       General Comments      Pertinent Vitals/ Pain       Pain Assessment Pain Assessment: No/denies pain Pain Intervention(s): Monitored during session  Home Living                                          Prior Functioning/Environment              Frequency  Min 2X/week        Progress Toward Goals  OT Goals(current goals can now be found in the care plan section)  Progress towards OT goals: Progressing toward goals  Acute Rehab OT Goals Patient Stated Goal: get better OT Goal Formulation: With patient Time For Goal Achievement: 09/13/22 Potential to Achieve Goals: Fair ADL Goals Pt Will Perform Lower Body Bathing: with min assist;sitting/lateral leans;sit to/from stand Pt Will Perform Lower Body Dressing: with min assist;sitting/lateral leans;sit to/from stand Pt Will Transfer to Toilet: with min assist;bedside commode;stand pivot transfer Pt Will Perform Toileting - Clothing Manipulation and hygiene: with min assist;sitting/lateral leans;sit to/from stand Additional ADL Goal #1: Patient will be able to stand EOB and complete functional task for 2-4 minutes without need for seated rest break as precursor to functional mobility and increased independence.  Plan Discharge plan remains appropriate;Frequency remains appropriate    Co-evaluation                 AM-PAC OT "6 Clicks" Daily Activity     Outcome Measure   Help from another person eating meals?: A Little Help from another person taking care of personal grooming?: A Little Help from another person toileting, which includes using toliet, bedpan, or urinal?: A Lot Help from another person bathing (including washing, rinsing, drying)?: A Lot Help from  another person to put on and taking off regular upper body clothing?: A Little Help from another person to put on and taking off regular lower body clothing?: A Little 6 Click Score: 16    End of Session Equipment Utilized During Treatment: Gait belt;Rolling walker (2 wheels)  OT Visit Diagnosis: Unsteadiness on feet (R26.81);Other abnormalities of gait and mobility (R26.89);Muscle weakness (generalized) (M62.81);Adult, failure to thrive (R62.7)   Activity Tolerance Patient tolerated treatment well   Patient Left in bed;with call bell/phone within reach;with bed alarm set   Nurse Communication Mobility status        Time: 9518-8416 OT Time Calculation (min): 26 min  Charges: OT General Charges $OT Visit: 1 Visit OT Treatments $Self Care/Home Management : 23-37 mins  Lodema Hong, [place]  Office [place] 09/06/2022, 2:24 PM

## 2022-09-06 NOTE — Progress Notes (Signed)
Progress Note   Patient: [name] DVV:[id number] DOB: [birth year] DOA: 08/28/2022     7 DOS: the patient was seen and examined on 09/06/2022   Brief hospital course: 82 year old female with history of HTN, hypothyroidism, depression/anxiety who comes to the hospital with diarrhea.  She was recently hospitalized at the end of 2023 with abdominal pain, septic shock, elevated lactic acid.  She was felt to have large intestinal ischemia and was taken to OR for an ex lap by Dr. Dema Severin, without any evidence of malperfusion of any of her intestine.  Was felt to have some sort of infectious colitis.  She was discharged to [facility], but in the last 2 to 3 weeks she has developed significant watery diarrhea.  She was treated for C. difficile colitis with p.o. vancomycin QID, and apparently finished antibiotics about 4 days prior to this admission.  She is not sure whether C. difficile was ever sent or she was treated empirically.  Despite treatment, she has been having persistent diarrhea with 5 watery bowel movements per day, and in the last week she has also noticed nighttime stool incontinence while asleep and wakes up soiled.  This is never happened to her, and prior to her surgery she has been actually constipated for most of her life.  She denies any blood in her stools, she does report occasional cramping with nighttime bowel movements.  She reports progressive weakness, failure to progress with PT, weight loss.  GI consulted, underwent a colonoscopy which showed findings concerning for Crohn's disease.  She was started on steroids   Assessment and Plan: Severe colitis, concern for Crohn's-patient with progressive diarrhea, 10 pound weight loss and progressive weakness mostly over the last several weeks.  It appears that she was empirically treated for C. difficile in her SNF but no C. difficile positive testing was available, and patient thinks it was never done -C. difficile and GI pathogen panel  negative this hospitalization -Gastroenterology consulted, she underwent a colonoscopy on 1/19 with diffuse severe inflammation in the sigmoid, descending and transverse colon, status post biopsies.  Dr. Cruzita Lederer discussed with GI, concern for Crohn's disease, she was started on steroids.  Continue steroids, on oral prednisone per GI -continue with po as tolerated.   Active problems Hyponatremia -sodium improved to 133   Essential hypertension-continue home medications as below -BP stable   Hypothyroidism-continue Synthroid -TSH 4.935   Hypomagnesemia-corrected   Hypokalemia-K remains within normal limits   Dysphagia -on a regular diet after speech evaluation.  She needs to work to fix her dentures   Anemia, of chronic disease-no bleeding, monitor   History of TIA-continue aspirin, statin   Depression/anxiety -continue home medications as below   Status post ex lap December 2023 - noted, remains stable      Subjective: Eager to get stronger with therapy  Physical Exam: Vitals:   09/05/22 0742 09/05/22 2051 09/06/22 0441 09/06/22 0449  BP: 133/76 (!) 129/55 129/63   Pulse: 94 71    Resp:  18    Temp: 98 F (36.7 C) 98 F (36.7 C) 98.3 F (36.8 C)   TempSrc:   Oral   SpO2: 100% 97% 100%   Weight:    57.6 kg  Height:       General exam: Awake, laying in bed, in nad Respiratory system: Normal respiratory effort, no wheezing Cardiovascular system: regular rate, s1, s2 Gastrointestinal system: Soft, nondistended, positive BS Central nervous system: CN2-12 grossly intact, strength intact Extremities: Perfused, no clubbing  Skin: Normal skin turgor, no notable skin lesions seen Psychiatry: Mood normal // no visual hallucinations   Data Reviewed:  Na 134, K 4.2, Cr 0.63, WBC 9.0, Hgb 10.7  Family Communication: Pt in room, family not at bedside  Disposition: Status is: Inpatient Remains inpatient appropriate because: Severity of illness  Planned Discharge  Destination:  [facility]    Author: Marylu Lund, MD 09/06/2022 2:28 PM  For on call review [email or web address].

## 2022-09-07 DIAGNOSIS — R197 Diarrhea, unspecified: Secondary | ICD-10-CM | POA: Diagnosis not present

## 2022-09-07 DIAGNOSIS — E8809 Other disorders of plasma-protein metabolism, not elsewhere classified: Secondary | ICD-10-CM | POA: Diagnosis not present

## 2022-09-07 LAB — COMPREHENSIVE METABOLIC PANEL
ALT: 24 U/L (ref 0–44)
AST: 18 U/L (ref 15–41)
Albumin: 2.1 g/dL — ABNORMAL LOW (ref 3.5–5.0)
Alkaline Phosphatase: 50 U/L (ref 38–126)
Anion gap: 9 (ref 5–15)
BUN: 15 mg/dL (ref 8–23)
CO2: 23 mmol/L (ref 22–32)
Calcium: 8.1 mg/dL — ABNORMAL LOW (ref 8.9–10.3)
Chloride: 101 mmol/L (ref 98–111)
Creatinine, Ser: 0.57 mg/dL (ref 0.44–1.00)
GFR, Estimated: >60 mL/min (ref >60)
Glucose, Bld: 100 mg/dL — ABNORMAL HIGH (ref 70–99)
Potassium: 3.8 mmol/L (ref 3.5–5.1)
Sodium: 133 mmol/L — ABNORMAL LOW (ref 135–145)
Total Bilirubin: 0.3 mg/dL (ref 0.3–1.2)
Total Protein: 4.7 g/dL — ABNORMAL LOW (ref 6.5–8.1)

## 2022-09-07 LAB — CBC
HCT: 31.5 % — ABNORMAL LOW (ref 36.0–46.0)
Hemoglobin: 10.1 g/dL — ABNORMAL LOW (ref 12.0–15.0)
MCH: 30.1 pg (ref 26.0–34.0)
MCHC: 32.1 g/dL (ref 30.0–36.0)
MCV: 93.8 fL (ref 80.0–100.0)
Platelets: 449 10*3/uL — ABNORMAL HIGH (ref 150–400)
RBC: 3.36 MIL/uL — ABNORMAL LOW (ref 3.87–5.11)
RDW: 15.9 % — ABNORMAL HIGH (ref 11.5–15.5)
WBC: 7.9 10*3/uL (ref 4.0–10.5)
nRBC: 0 % (ref 0.0–0.2)

## 2022-09-07 NOTE — Progress Notes (Signed)
Mobility Specialist Progress Note:   09/07/22 1455  Mobility  Activity Ambulated with assistance in hallway  Level of Assistance Minimal assist, patient does 75% or more  Assistive Device Front wheel walker  Distance Ambulated (ft) 100 ft  Activity Response Tolerated fair  Mobility Referral Yes  $Mobility charge 1 Mobility   Pt received sitting EOB and eager. C/o fatigue throughout ambulation, requiring 2x brief standing rest breaks. Pt returned to bed with all needs met and call bell in reach.   Andrey Campanile Mobility Specialist Please contact via SecureChat or  Rehab office at [phone]

## 2022-09-07 NOTE — Progress Notes (Signed)
Progress Note   Patient: [name] VHQ:[id number] DOB: 03-Nov-1940 DOA: 08/28/2022     8 DOS: the patient was seen and examined on 09/07/2022   Brief hospital course: 82 year old female with history of HTN, hypothyroidism, depression/anxiety who comes to the hospital with diarrhea.  She was recently hospitalized at the end of 2023 with abdominal pain, septic shock, elevated lactic acid.  She was felt to have large intestinal ischemia and was taken to OR for an ex lap by Dr. Dema Severin, without any evidence of malperfusion of any of her intestine.  Was felt to have some sort of infectious colitis.  She was discharged to [place], but in the last 2 to 3 weeks she has developed significant watery diarrhea.  She was treated for C. difficile colitis with p.o. vancomycin QID, and apparently finished antibiotics about 4 days prior to this admission.  She is not sure whether C. difficile was ever sent or she was treated empirically.  Despite treatment, she has been having persistent diarrhea with 5 watery bowel movements per day, and in the last week she has also noticed nighttime stool incontinence while asleep and wakes up soiled.  This is never happened to her, and prior to her surgery she has been actually constipated for most of her life.  She denies any blood in her stools, she does report occasional cramping with nighttime bowel movements.  She reports progressive weakness, failure to progress with PT, weight loss.  GI consulted, underwent a colonoscopy which showed findings concerning for Crohn's disease.  She was started on steroids   Assessment and Plan: Severe colitis, concern for Crohn's-patient with progressive diarrhea, 10 pound weight loss and progressive weakness mostly over the last several weeks.  It appears that she was empirically treated for C. difficile in her SNF but no C. difficile positive testing was available, and patient thinks it was never done -C. difficile and GI pathogen panel  negative this hospitalization -Gastroenterology consulted, she underwent a colonoscopy on 1/19 with diffuse severe inflammation in the sigmoid, descending and transverse colon, status post biopsies.  Dr. Cruzita Lederer discussed with GI, concern for Crohn's disease, she was started on steroids.  Continue steroids, cont prednisone per GI -continue with po as tolerated.   Active problems Hyponatremia -sodium improved to 133   Essential hypertension-continue home medications as below -BP stable   Hypothyroidism-continue Synthroid -TSH 4.935   Hypomagnesemia-corrected   Hypokalemia-K remains within normal limits   Dysphagia -on a regular diet after speech evaluation.  She needs to work to fix her dentures   Anemia, of chronic disease-no bleeding, monitor   History of TIA-continue aspirin, statin   Depression/anxiety -continue home medications as below   Status post ex lap December 2023 - noted, remains stable      Subjective: Reporting two episodes of loose stools this AM, tolerating diet  Physical Exam: Vitals:   09/06/22 2003 09/07/22 0438 09/07/22 0833 09/07/22 0833  BP: 128/72 127/68  125/65  Pulse: 72 69  77  Resp:  16  17  Temp: 98.7 F (37.1 C) 97.9 F (36.6 C)  98.3 F (36.8 C)  TempSrc: Oral Oral    SpO2: 97% 100%  100%  Weight:   57.8 kg   Height:       General exam: Conversant, in no acute distress Respiratory system: normal chest rise, clear, no audible wheezing Cardiovascular system: regular rhythm, s1-s2 Gastrointestinal system: Nondistended, nontender, pos BS Central nervous system: No seizures, no tremors Extremities: No cyanosis,  no joint deformities Skin: No rashes, no pallor Psychiatry: Affect normal // no auditory hallucinations   Data Reviewed:  Na 133, k 3.8, Cr 0.57, WBC 7.9, Hgb 10.1  Family Communication: Pt in room, family not at bedside  Disposition: Status is: Inpatient Remains inpatient appropriate because: Severity of illness  Planned  Discharge Destination:  [facility]    Author: Marylu Lund, MD 09/07/2022 2:39 PM  For on call review [email or web address].

## 2022-09-08 DIAGNOSIS — A0472 Enterocolitis due to Clostridium difficile, not specified as recurrent: Secondary | ICD-10-CM | POA: Diagnosis not present

## 2022-09-08 DIAGNOSIS — R197 Diarrhea, unspecified: Secondary | ICD-10-CM | POA: Diagnosis not present

## 2022-09-08 DIAGNOSIS — E8809 Other disorders of plasma-protein metabolism, not elsewhere classified: Secondary | ICD-10-CM | POA: Diagnosis not present

## 2022-09-08 LAB — CBC
HCT: 34.2 % — ABNORMAL LOW (ref 36.0–46.0)
Hemoglobin: 10.8 g/dL — ABNORMAL LOW (ref 12.0–15.0)
MCH: 29.8 pg (ref 26.0–34.0)
MCHC: 31.6 g/dL (ref 30.0–36.0)
MCV: 94.5 fL (ref 80.0–100.0)
Platelets: 471 10*3/uL — ABNORMAL HIGH (ref 150–400)
RBC: 3.62 MIL/uL — ABNORMAL LOW (ref 3.87–5.11)
RDW: 15.8 % — ABNORMAL HIGH (ref 11.5–15.5)
WBC: 8 10*3/uL (ref 4.0–10.5)
nRBC: 0 % (ref 0.0–0.2)

## 2022-09-08 LAB — COMPREHENSIVE METABOLIC PANEL
ALT: 23 U/L (ref 0–44)
AST: 16 U/L (ref 15–41)
Albumin: 2.1 g/dL — ABNORMAL LOW (ref 3.5–5.0)
Alkaline Phosphatase: 53 U/L (ref 38–126)
Anion gap: 7 (ref 5–15)
BUN: 15 mg/dL (ref 8–23)
CO2: 27 mmol/L (ref 22–32)
Calcium: 8 mg/dL — ABNORMAL LOW (ref 8.9–10.3)
Chloride: 99 mmol/L (ref 98–111)
Creatinine, Ser: 0.57 mg/dL (ref 0.44–1.00)
GFR, Estimated: >60 mL/min (ref >60)
Glucose, Bld: 134 mg/dL — ABNORMAL HIGH (ref 70–99)
Potassium: 4.1 mmol/L (ref 3.5–5.1)
Sodium: 133 mmol/L — ABNORMAL LOW (ref 135–145)
Total Bilirubin: 0.3 mg/dL (ref 0.3–1.2)
Total Protein: 4.7 g/dL — ABNORMAL LOW (ref 6.5–8.1)

## 2022-09-08 MED ORDER — SACCHAROMYCES BOULARDII 250 MG PO CAPS
250.0000 mg | ORAL_CAPSULE | Freq: Two times a day (BID) | ORAL | Status: DC
  Administered 2022-09-08 – 2022-09-11 (×7): 250 mg via ORAL
  Filled 2022-09-08 (×7): qty 1

## 2022-09-08 NOTE — Progress Notes (Signed)
Mobility Specialist Progress Note:   09/08/22 1647  Mobility  Activity Ambulated with assistance in hallway  Level of Assistance Minimal assist, patient does 75% or more  Assistive Device Front wheel walker  Distance Ambulated (ft) 150 ft  Activity Response Tolerated fair  Mobility Referral Yes  $Mobility charge 1 Mobility   Pt received in bed and eager. Upon standing from EOB, pt had difficultly coming into an upright position and had a posterior lean, requiring MinA to keep balance while standing. Pt CG with ambulation and requested to practice sit to stands at end of ambulation. Pt returned to bed with all needs met and call bell in reach.   Andrey Campanile Mobility Specialist Please contact via SecureChat or  Rehab office at [phone]

## 2022-09-08 NOTE — Progress Notes (Signed)
Progress Note   Patient: [name] LZJ:[id number] DOB: 05-24-1941 DOA: 08/28/2022     9 DOS: the patient was seen and examined on 09/08/2022   Brief hospital course: 82 year old female with history of HTN, hypothyroidism, depression/anxiety who comes to the hospital with diarrhea.  She was recently hospitalized at the end of 2023 with abdominal pain, septic shock, elevated lactic acid.  She was felt to have large intestinal ischemia and was taken to OR for an ex lap by Dr. Dema Severin, without any evidence of malperfusion of any of her intestine.  Was felt to have some sort of infectious colitis.  She was discharged to [place], but in the last 2 to 3 weeks she has developed significant watery diarrhea.  She was treated for C. difficile colitis with p.o. vancomycin QID, and apparently finished antibiotics about 4 days prior to this admission.  She is not sure whether C. difficile was ever sent or she was treated empirically.  Despite treatment, she has been having persistent diarrhea with 5 watery bowel movements per day, and in the last week she has also noticed nighttime stool incontinence while asleep and wakes up soiled.  This is never happened to her, and prior to her surgery she has been actually constipated for most of her life.  She denies any blood in her stools, she does report occasional cramping with nighttime bowel movements.  She reports progressive weakness, failure to progress with PT, weight loss.  GI consulted, underwent a colonoscopy which showed findings concerning for Crohn's disease.  She was started on steroids   Assessment and Plan: Severe colitis, concern for Crohn's-patient with progressive diarrhea, 10 pound weight loss and progressive weakness mostly over the last several weeks.  It appears that she was empirically treated for C. difficile in her SNF but no C. difficile positive testing was available, and patient thinks it was never done -C. difficile and GI pathogen panel  negative this hospitalization -Gastroenterology consulted, she underwent a colonoscopy on 1/19 with diffuse severe inflammation in the sigmoid, descending and transverse colon, status post biopsies.  Dr. Cruzita Lederer discussed with GI, concern for Crohn's disease, she was started on steroids. -Pt complaining of continued loose stools, up to 4 bouts yesterday. Discussed with GI who recommends continue steroids, not to wean below '40mg'$  prednisone daily, close follow up with GI  Active problems Hyponatremia -sodium improved to 133   Essential hypertension-continue home medications as below -BP stable   Hypothyroidism-continue Synthroid -TSH 4.935   Hypomagnesemia-corrected   Hypokalemia-K remains within normal limits   Dysphagia -on a regular diet after speech evaluation.    Anemia, of chronic disease-no bleeding, monitor   History of TIA-continue aspirin, statin   Depression/anxiety -continue home medications as below   Status post ex lap December 2023 - noted, remains stable      Subjective: Complaining of loose stools  Physical Exam: Vitals:   09/07/22 1928 09/08/22 0416 09/08/22 0813 09/08/22 1001  BP: (!) 123/57 128/69  119/77  Pulse: 75 79  100  Resp: '20 18  18  '$ Temp: 98.2 F (36.8 C) 98.3 F (36.8 C)  98.4 F (36.9 C)  TempSrc: Oral Oral  Oral  SpO2: 99% 100%  100%  Weight:   57.6 kg   Height:       General exam: Awake, laying in bed, in nad Respiratory system: Normal respiratory effort, no wheezing Cardiovascular system: regular rate, s1, s2 Gastrointestinal system: Soft, nondistended, positive BS Central nervous system: CN2-12 grossly intact,  strength intact Extremities: Perfused, no clubbing Skin: Normal skin turgor, no notable skin lesions seen Psychiatry: Mood normal // no visual hallucinations   Data Reviewed:  Labs reviewed: Na 133, K 4.1, Cr 0.57, Hgb 10.8  Family Communication: Pt in room, family not at bedside  Disposition: Status is:  Inpatient Remains inpatient appropriate because: Severity of illness  Planned Discharge Destination:  [facility]    Author: Marylu Lund, MD 09/08/2022 3:08 PM  For on call review [email or web address].

## 2022-09-09 ENCOUNTER — Other Ambulatory Visit (HOSPITAL_COMMUNITY): Payer: Self-pay

## 2022-09-09 DIAGNOSIS — E8809 Other disorders of plasma-protein metabolism, not elsewhere classified: Secondary | ICD-10-CM | POA: Diagnosis not present

## 2022-09-09 DIAGNOSIS — R197 Diarrhea, unspecified: Secondary | ICD-10-CM | POA: Diagnosis not present

## 2022-09-09 LAB — CBC
HCT: 33.3 % — ABNORMAL LOW (ref 36.0–46.0)
Hemoglobin: 10.5 g/dL — ABNORMAL LOW (ref 12.0–15.0)
MCH: 29.9 pg (ref 26.0–34.0)
MCHC: 31.5 g/dL (ref 30.0–36.0)
MCV: 94.9 fL (ref 80.0–100.0)
Platelets: 451 10*3/uL — ABNORMAL HIGH (ref 150–400)
RBC: 3.51 MIL/uL — ABNORMAL LOW (ref 3.87–5.11)
RDW: 15.8 % — ABNORMAL HIGH (ref 11.5–15.5)
WBC: 6.5 10*3/uL (ref 4.0–10.5)
nRBC: 0 % (ref 0.0–0.2)

## 2022-09-09 LAB — COMPREHENSIVE METABOLIC PANEL
ALT: 21 U/L (ref 0–44)
AST: 21 U/L (ref 15–41)
Albumin: 2 g/dL — ABNORMAL LOW (ref 3.5–5.0)
Alkaline Phosphatase: 56 U/L (ref 38–126)
Anion gap: 8 (ref 5–15)
BUN: 13 mg/dL (ref 8–23)
CO2: 26 mmol/L (ref 22–32)
Calcium: 8.2 mg/dL — ABNORMAL LOW (ref 8.9–10.3)
Chloride: 97 mmol/L — ABNORMAL LOW (ref 98–111)
Creatinine, Ser: 0.49 mg/dL (ref 0.44–1.00)
GFR, Estimated: >60 mL/min (ref >60)
Glucose, Bld: 146 mg/dL — ABNORMAL HIGH (ref 70–99)
Potassium: 4.1 mmol/L (ref 3.5–5.1)
Sodium: 131 mmol/L — ABNORMAL LOW (ref 135–145)
Total Bilirubin: 0.3 mg/dL (ref 0.3–1.2)
Total Protein: 4.7 g/dL — ABNORMAL LOW (ref 6.5–8.1)

## 2022-09-09 MED ORDER — VALACYCLOVIR HCL 1 G PO TABS
1000.0000 mg | ORAL_TABLET | Freq: Two times a day (BID) | ORAL | 0 refills | Status: DC
  Filled 2022-09-09: qty 12, 6d supply, fill #0

## 2022-09-09 MED ORDER — SACCHAROMYCES BOULARDII 250 MG PO CAPS
250.0000 mg | ORAL_CAPSULE | Freq: Two times a day (BID) | ORAL | 0 refills | Status: DC
  Filled 2022-09-09: qty 28, 14d supply, fill #0

## 2022-09-09 MED ORDER — PROSOURCE PLUS PO LIQD
30.0000 mL | Freq: Two times a day (BID) | ORAL | 0 refills | Status: DC
  Filled 2022-09-09: qty 887, 15d supply, fill #0

## 2022-09-09 MED ORDER — VALACYCLOVIR HCL 500 MG PO TABS
1000.0000 mg | ORAL_TABLET | Freq: Two times a day (BID) | ORAL | Status: DC
  Administered 2022-09-09 – 2022-09-11 (×4): 1000 mg via ORAL
  Filled 2022-09-09 (×5): qty 2

## 2022-09-09 MED ORDER — KATE FARMS STANDARD 1.4 PO LIQD
325.0000 mL | Freq: Two times a day (BID) | ORAL | 0 refills | Status: DC
  Filled 2022-09-09: qty 325, 1d supply, fill #0

## 2022-09-09 MED ORDER — PREDNISONE 20 MG PO TABS
20.0000 mg | ORAL_TABLET | Freq: Two times a day (BID) | ORAL | 0 refills | Status: DC
  Filled 2022-09-09: qty 60, 30d supply, fill #0

## 2022-09-09 NOTE — Progress Notes (Signed)
CSW spoke with Erasmo Downer at [facility] who states the plan for the patient to move into the facility on Wednesday is still in place. Erasmo Downer requesting a copy of [place] orders be e-mailed to her when patient is ready for discharge.   CSW sent secure chat to MD to request orders be placed for [facility] PT.  Madilyn Fireman, MSW, LCSW Transitions of Care  Clinical Social Worker II [phone]

## 2022-09-09 NOTE — Plan of Care (Signed)

## 2022-09-09 NOTE — Progress Notes (Signed)
Progress Note   Patient: [name] [id number]:[id number] DOB: July 19, 1941 DOA: 08/28/2022     10 DOS: the patient was seen and examined on 09/09/2022   Brief hospital course: 82 year old female with history of HTN, hypothyroidism, depression/anxiety who comes to the hospital with diarrhea.  She was recently hospitalized at the end of 2023 with abdominal pain, septic shock, elevated lactic acid.  She was felt to have large intestinal ischemia and was taken to OR for an ex lap by Dr. Dema Severin, without any evidence of malperfusion of any of her intestine.  Was felt to have some sort of infectious colitis.  She was discharged to [place], but in the last 2 to 3 weeks she has developed significant watery diarrhea.  She was treated for C. difficile colitis with p.o. vancomycin QID, and apparently finished antibiotics about 4 days prior to this admission.  She is not sure whether C. difficile was ever sent or she was treated empirically.  Despite treatment, she has been having persistent diarrhea with 5 watery bowel movements per day, and in the last week she has also noticed nighttime stool incontinence while asleep and wakes up soiled.  This is never happened to her, and prior to her surgery she has been actually constipated for most of her life.  She denies any blood in her stools, she does report occasional cramping with nighttime bowel movements.  She reports progressive weakness, failure to progress with PT, weight loss.  GI consulted, underwent a colonoscopy which showed findings concerning for Crohn's disease.  She was started on steroids   Assessment and Plan: Severe colitis, concern for Crohn's-patient with progressive diarrhea, 10 pound weight loss and progressive weakness mostly over the last several weeks.  It appears that she was empirically treated for C. difficile in her SNF but no C. difficile positive testing was available, and patient thinks it was never done -C. difficile and GI pathogen panel  negative this hospitalization -Gastroenterology consulted, she underwent a colonoscopy on 1/19 with diffuse severe inflammation in the sigmoid, descending and transverse colon, status post biopsies.  Dr. Cruzita Lederer discussed with GI, concern for Crohn's disease, she was started on steroids. -Pt had some continued loose stools that has improved as of today -GI had recommended continue steroids, not to wean below '40mg'$  prednisone daily, close follow up with GI  Active problems Hyponatremia -sodium stable at 131   Essential hypertension-continue home medications as below -BP stable and controlled   Hypothyroidism-continue Synthroid -TSH 4.935   Hypomagnesemia-corrected   Hypokalemia-K remains within normal limits   Dysphagia -on a regular diet after speech evaluation.    Anemia, of chronic disease-no bleeding, monitor   History of TIA-continue aspirin, statin   Depression/anxiety -continue home medications as below   Status post ex lap December 2023 - noted, remains stable  Oral sores/ulcerations -multiple well-demarcated sores across lower lip on exam. 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 Will prescribe here -Continue magic mouthwash as needed      Subjective: complaining of mouth sores  Physical Exam: Vitals:   09/08/22 2107 09/09/22 0339 09/09/22 0344 09/09/22 0745  BP: (!) 120/58 123/66 122/60 (!) 124/57  Pulse: 80 70 82 98  Resp: '16 16 18 18  '$ Temp: 98.2 F (36.8 C) 98.1 F (36.7 C) 98.6 F (37 C) 98 F (36.7 C)  TempSrc: Oral Oral Oral Oral  SpO2: 99% 96% 100% 98%  Weight:      Height:  General exam: Conversant, in no acute distress Respiratory system: normal chest rise, clear, no audible wheezing Cardiovascular system: regular rhythm, s1-s2 Gastrointestinal system: Nondistended, nontender, pos BS Central nervous system: No seizures, no tremors Extremities: No cyanosis, no joint deformities Skin: No rashes, no pallor Psychiatry: Affect  normal // no auditory hallucinations   Data Reviewed:  Labs reviewed: Na 131, K 4.1, Cr 0.49, hgb 10.5  Family Communication: Pt in room, family not at bedside  Disposition: Status is: Inpatient Remains inpatient appropriate because: Severity of illness  Planned Discharge Destination:  [facility]    Author: Marylu Lund, MD 09/09/2022 3:31 PM  For on call review [email or web address].

## 2022-09-09 NOTE — Progress Notes (Signed)
Mobility Specialist Progress Note:   09/09/22 1153  Mobility  Activity Ambulated with assistance in hallway  Level of Assistance Contact guard assist, steadying assist  Assistive Device Front wheel walker  Distance Ambulated (ft) 300 ft  Activity Response Tolerated well  Mobility Referral Yes  $Mobility charge 1 Mobility   Pt received in bed and eager. No complaints of pain or SOB. Pt returned to chair with all needs met and call bell in reach.   Andrey Campanile Mobility Specialist Please contact via SecureChat or  Rehab office at [phone]

## 2022-09-10 ENCOUNTER — Other Ambulatory Visit (HOSPITAL_COMMUNITY): Payer: Self-pay

## 2022-09-10 DIAGNOSIS — A0472 Enterocolitis due to Clostridium difficile, not specified as recurrent: Secondary | ICD-10-CM | POA: Diagnosis not present

## 2022-09-10 DIAGNOSIS — R197 Diarrhea, unspecified: Secondary | ICD-10-CM | POA: Diagnosis not present

## 2022-09-10 DIAGNOSIS — E8809 Other disorders of plasma-protein metabolism, not elsewhere classified: Secondary | ICD-10-CM | POA: Diagnosis not present

## 2022-09-10 LAB — COMPREHENSIVE METABOLIC PANEL
ALT: 23 U/L (ref 0–44)
AST: 19 U/L (ref 15–41)
Albumin: 2.3 g/dL — ABNORMAL LOW (ref 3.5–5.0)
Alkaline Phosphatase: 52 U/L (ref 38–126)
Anion gap: 12 (ref 5–15)
BUN: 14 mg/dL (ref 8–23)
CO2: 25 mmol/L (ref 22–32)
Calcium: 8.6 mg/dL — ABNORMAL LOW (ref 8.9–10.3)
Chloride: 97 mmol/L — ABNORMAL LOW (ref 98–111)
Creatinine, Ser: 0.52 mg/dL (ref 0.44–1.00)
GFR, Estimated: >60 mL/min (ref >60)
Glucose, Bld: 94 mg/dL (ref 70–99)
Potassium: 3.8 mmol/L (ref 3.5–5.1)
Sodium: 134 mmol/L — ABNORMAL LOW (ref 135–145)
Total Bilirubin: 0.3 mg/dL (ref 0.3–1.2)
Total Protein: 5.1 g/dL — ABNORMAL LOW (ref 6.5–8.1)

## 2022-09-10 LAB — CBC
HCT: 37.2 % (ref 36.0–46.0)
Hemoglobin: 11.6 g/dL — ABNORMAL LOW (ref 12.0–15.0)
MCH: 30 pg (ref 26.0–34.0)
MCHC: 31.2 g/dL (ref 30.0–36.0)
MCV: 96.1 fL (ref 80.0–100.0)
Platelets: 466 10*3/uL — ABNORMAL HIGH (ref 150–400)
RBC: 3.87 MIL/uL (ref 3.87–5.11)
RDW: 15.9 % — ABNORMAL HIGH (ref 11.5–15.5)
WBC: 7.6 10*3/uL (ref 4.0–10.5)
nRBC: 0 % (ref 0.0–0.2)

## 2022-09-10 MED ORDER — PROSOURCE PLUS PO LIQD: 30.0000 mL | Freq: Two times a day (BID) | ORAL | 0 refills | Status: DC

## 2022-09-10 MED ORDER — SACCHAROMYCES BOULARDII 250 MG PO CAPS: 250.0000 mg | ORAL_CAPSULE | Freq: Two times a day (BID) | ORAL | 0 refills | Status: DC

## 2022-09-10 MED ORDER — KATE FARMS STANDARD 1.4 PO LIQD: 325.0000 mL | Freq: Two times a day (BID) | ORAL | 0 refills | Status: DC

## 2022-09-10 MED ORDER — PREDNISONE 20 MG PO TABS: 20.0000 mg | ORAL_TABLET | Freq: Two times a day (BID) | ORAL | 0 refills | Status: DC

## 2022-09-10 MED ORDER — VALACYCLOVIR HCL 1 G PO TABS: 1000.0000 mg | ORAL_TABLET | Freq: Two times a day (BID) | ORAL | 0 refills | Status: AC

## 2022-09-10 NOTE — Progress Notes (Signed)
Occupational Therapy Treatment Patient Details Name: [name] MRN: [id number] DOB: 1941-04-03 Today's Date: 09/10/2022   History of present illness 82 yo female admitted 08/28/22 with persistent diarrhea. C-diff negative. 1/19 colonoscopy with concern for Chron's disease. PMHx: constipation, fibromyalgia, anxiety, insomnia, GERD, AV block, HTN.   OT comments  Pt making steady progress towards OT goals this session. Pt continues to present with impaired balance, impaired body awareness and gait abnormalities impacting pts ability to complete ambulatory ADLs independently. Pt currently requires [place]  for functional ambulation greater than a household distance with RW, decreased ability to kick LLE through during gait with R ankle inversion noted. Reviewed BLE HEP from previous PT session. Pt currently requires supervision for seated UB/LB ADLs. Pt very motivated to return to PLOF. Pt would continue to benefit from skilled occupational therapy while admitted and after d/c to address the below listed limitations in order to improve overall functional mobility and facilitate independence with BADL participation. DC plan remains appropriate, will follow acutely per POC.      Recommendations for follow up therapy are one component of a multi-disciplinary discharge planning process, led by the attending physician.  Recommendations may be updated based on patient status, additional functional criteria and insurance authorization.    Follow Up Recommendations  Skilled nursing-short term rehab (<3 hours/day) (Patient does not want to return to [facility], has a slot at [facility] on 09/14/22)     Assistance Recommended at Discharge Intermittent Supervision/Assistance  Patient can return home with the following  Assistance with cooking/housework;Assist for transportation;Help with stairs or ramp for entrance;A little help with walking and/or transfers;A little help with  bathing/dressing/bathroom   Equipment Recommendations  Other (comment) (defer)    Recommendations for Other Services      Precautions / Restrictions Precautions Precautions: Fall Restrictions Weight Bearing Restrictions: No       Mobility Bed Mobility Overal bed mobility: Modified Independent Bed Mobility: Sit to Supine       Sit to supine: Modified independent (Device/Increase time)        Transfers Overall transfer level: Needs assistance Equipment used: Rolling walker (2 wheels) Transfers: Sit to/from Stand Sit to Stand: Min assist, Mod assist           General transfer comment: MOD A initially to rise from recliner d/t posterior lean, MIN A as pt progressed with cues for hand placement     Balance Overall balance assessment: Needs assistance Sitting-balance support: No upper extremity supported, Feet supported Sitting balance-Leahy Scale: Fair     Standing balance support: Single extremity supported, Bilateral upper extremity supported, During functional activity Standing balance-Leahy Scale: Poor                             ADL either performed or assessed with clinical judgement   ADL Overall ADL's : Needs assistance/impaired             Lower Body Bathing: Supervison/ safety;Sitting/lateral leans Lower Body Bathing Details (indicate cue type and reason): simulated via LB Dresssing Upper Body Dressing : Supervision/safety;Set up;Sitting Upper Body Dressing Details (indicate cue type and reason): to don back side gown Lower Body Dressing: Supervision/safety;Sitting/lateral leans Lower Body Dressing Details (indicate cue type and reason): to doff shoes from EOB Toilet Transfer: Minimal assistance;Ambulation Toilet Transfer Details (indicate cue type and reason): simulated via functional mobility         Functional mobility during ADLs: Minimal assistance;Rolling walker (  2 wheels);Cueing for sequencing General ADL Comments: ADL  participation impacted by impaired balance and generalized weakness    Extremity/Trunk Assessment Upper Extremity Assessment Upper Extremity Assessment: Generalized weakness   Lower Extremity Assessment Lower Extremity Assessment: Defer to PT evaluation        Vision Baseline Vision/History: 1 Wears glasses Patient Visual Report: No change from baseline     Perception Perception Perception: Within Functional Limits   Praxis Praxis Praxis: Intact    Cognition Arousal/Alertness: Awake/alert Behavior During Therapy: WFL for tasks assessed/performed Overall Cognitive Status: Within Functional Limits for tasks assessed (overall WFL for simple tasks)                                          Exercises General Exercises - Lower Extremity Long Arc Quad: AROM, Seated, 15 reps, Right Hip Flexion/Marching: AROM, Seated, 15 reps, Right Heel Raises: AROM, Both, 10 reps, Seated    Shoulder Instructions       General Comments      Pertinent Vitals/ Pain       Pain Assessment Pain Assessment: No/denies pain  Home Living                                          Prior Functioning/Environment              Frequency  Min 2X/week        Progress Toward Goals  OT Goals(current goals can now be found in the care plan section)  Progress towards OT goals: Progressing toward goals  Acute Rehab OT Goals Patient Stated Goal: to go to rehab OT Goal Formulation: With patient Time For Goal Achievement: 09/13/22 Potential to Achieve Goals: [place] Discharge plan remains appropriate;Frequency remains appropriate    Co-evaluation                 AM-PAC OT "6 Clicks" Daily Activity     Outcome Measure   Help from another person eating meals?: None Help from another person taking care of personal grooming?: A Little Help from another person toileting, which includes using toliet, bedpan, or urinal?: A Little Help from another  person bathing (including washing, rinsing, drying)?: A Little Help from another person to put on and taking off regular upper body clothing?: A Little Help from another person to put on and taking off regular lower body clothing?: A Little 6 Click Score: 19    End of Session Equipment Utilized During Treatment: Gait belt;Rolling walker (2 wheels)  OT Visit Diagnosis: Unsteadiness on feet (R26.81);Other abnormalities of gait and mobility (R26.89);Muscle weakness (generalized) (M62.81);Adult, failure to thrive (R62.7)   Activity Tolerance Patient tolerated treatment well   Patient Left in bed;with call bell/phone within reach;with bed alarm set   Nurse Communication Mobility status;Other (comment) (via secure chat)        Time: 4496-7591 OT Time Calculation (min): 18 min  Charges: OT General Charges $OT Visit: 1 Visit OT Treatments $Therapeutic Activity: 8-22 mins  Harley Alto., COTA/L Acute Rehabilitation Services [phone]   Precious Haws 09/10/2022, 1:45 PM

## 2022-09-10 NOTE — Progress Notes (Signed)
Physical Therapy Treatment Patient Details Name: [name] MRN: [id number] DOB: [birth year] Today's Date: 09/10/2022   History of Present Illness 82 yo female admitted 08/28/22 with persistent diarrhea. C-diff negative. 1/19 colonoscopy with concern for Chron's disease. PMHx: constipation, fibromyalgia, anxiety, insomnia, GERD, AV block, HTN.    PT Comments    Pt pleasant and remains eager to progress balance and mobility. Pt with improved standing trials but still struggles with maintaining anterior translation. Reinforced education for bil LE HEP and ankle strengthening to prevent inversion with gait. Plan appropriate and will continue to follow.     Recommendations for follow up therapy are one component of a multi-disciplinary discharge planning process, led by the attending physician.  Recommendations may be updated based on patient status, additional functional criteria and insurance authorization.  Follow Up Recommendations  Home health PT Can patient physically be transported by private vehicle: Yes   Assistance Recommended at Discharge Frequent or constant Supervision/Assistance  Patient can return home with the following A little help with walking and/or transfers;A little help with bathing/dressing/bathroom;Assist for transportation;Assistance with cooking/housework   Equipment Recommendations  Rolling walker (2 wheels)    Recommendations for Other Services       Precautions / Restrictions Precautions Precautions: Fall     Mobility  Bed Mobility Overal bed mobility: Modified Independent                  Transfers Overall transfer level: Needs assistance   Transfers: Sit to/from Stand Sit to Stand: Min assist Stand pivot transfers: Min guard         General transfer comment: minguard with occasional min assist for anterior translation. Pt performed a total of 13 trials from bed and chair with cues for sequence and anterior translation. Pivot bed  <>chair without RW with use of rail to stabilize and guarding for safety    Ambulation/Gait Ambulation/Gait assistance: Min guard Gait Distance (Feet): 300 Feet Assistive device: Rolling walker (2 wheels) Gait Pattern/deviations: Step-through pattern, Decreased stride length, Step-to pattern   Gait velocity interpretation: <1.8 ft/sec, indicate of risk for recurrent falls   General Gait Details: step to pattern at times with cues to increase stride and RT ankle eversion with gait, progression with step through pattern but does still need cues to prevent step to   [employer] Mobility    Modified Rankin (Stroke Patients Only)       Balance Overall balance assessment: Needs assistance Sitting-balance support: No upper extremity supported, Feet supported Sitting balance-Leahy Scale: Fair Sitting balance - Comments: able to don shoes seated on EOB   Standing balance support: Single extremity supported, Bilateral upper extremity supported, During functional activity Standing balance-Leahy Scale: Poor Standing balance comment: single or bil UE support in standing                            Cognition Arousal/Alertness: Awake/alert Behavior During Therapy: WFL for tasks assessed/performed Overall Cognitive Status: Impaired/Different from baseline Area of Impairment: Safety/judgement                         Safety/Judgement: Decreased awareness of deficits     General Comments: continues to need cues to attend to posterior bias and Rt ankle inversion        Exercises General Exercises - Lower Extremity Long Arc Quad: AROM, Seated, 15 reps,  Right Hip Flexion/Marching: AROM, Seated, 15 reps, Right Toe Raises: AROM, 20 reps, Seated, Right    General Comments        Pertinent Vitals/Pain Pain Assessment Pain Assessment: No/denies pain    Home Living                          Prior Function            PT  Goals (current goals can now be found in the care plan section) Progress towards PT goals: Progressing toward goals    Frequency    Min 3X/week      PT Plan Current plan remains appropriate    Co-evaluation              AM-PAC PT "6 Clicks" Mobility   Outcome Measure  Help needed turning from your back to your side while in a flat bed without using bedrails?: None Help needed moving from lying on your back to sitting on the side of a flat bed without using bedrails?: A Little Help needed moving to and from a bed to a chair (including a wheelchair)?: A Little Help needed standing up from a chair using your arms (e.g., wheelchair or bedside chair)?: A Little Help needed to walk in hospital room?: A Little Help needed climbing 3-5 steps with a railing? : A Lot 6 Click Score: 18    End of Session Equipment Utilized During Treatment: Gait belt Activity Tolerance: Patient tolerated treatment well Patient left: in chair;with call bell/phone within reach;with chair alarm set Nurse Communication: Mobility status PT Visit Diagnosis: Unsteadiness on feet (R26.81);Other abnormalities of gait and mobility (R26.89);Muscle weakness (generalized) (M62.81);Difficulty in walking, not elsewhere classified (R26.2)     Time: 3254-9826 PT Time Calculation (min) (ACUTE ONLY): 34 min  Charges:  $Gait Training: 8-22 mins $Therapeutic Activity: 8-22 mins                     Bayard Males, PT Acute Rehabilitation Services Office: [place] 09/10/2022, 12:03 PM

## 2022-09-10 NOTE — Progress Notes (Signed)
Durable Medical Equipment  (From admission, onward)           Start     Ordered   09/10/22 1407  For home use only DME Bedside commode  Once       Question:  Patient needs a bedside commode to treat with the following condition  Answer:  Generalized weakness   09/10/22 1407   09/10/22 1406  For home use only DME lightweight manual wheelchair with seat cushion  Once       Comments: Patient suffers from severe colitis and hyponatremia which impairs their ability to perform daily activities like grooming, mobilization in the home.  A walker will not resolve  issue with performing activities of daily living. A wheelchair will allow patient to safely perform daily activities. Patient is not able to propel themselves in the home using a standard weight wheelchair due to general weakness. Patient can self propel in the lightweight wheelchair. Length of need 6 months . Accessories: elevating leg rests (ELRs), wheel locks, extensions and anti-tippers.   09/10/22 1407

## 2022-09-10 NOTE — Progress Notes (Addendum)
3:10pm: CSW spoke with Claiborne Billings at [facility] who states the agency will provide patiebnt with [facility] at [facility]. Claiborne Billings requesting RN be added to [facility] order for medication management.  2pm: CSW spoke with Jermaine at [facility] to order bedside commode and wheelchair.  CSW sent patient's [place] orders to [place] at [facility].  8:40am: CSW spoke with patient at bedside to discuss tomorrow's discharge plan. Patient reports her furniture is getting delivered to the facility at [facility] tomorrow. Patient reports her daughter will be transporting her around noon. Patient reports she wants all of her prescriptions sent to [facility] will sort and pre-pack all of her medication for easy dispensing at the facility. Patient states she wants a wheelchair and bedside commode to assist with ADL's at the facility.  CSW spoke with MD to request orders be placed and prescriptions be sent to the pharmacy.  Madilyn Fireman, MSW, LCSW Transitions of Care  Clinical Social Worker II [phone]

## 2022-09-10 NOTE — Progress Notes (Signed)
Progress Note   Patient: [name] [id number]:[id number] DOB: Jun 19, 1941 DOA: 08/28/2022     11 DOS: the patient was seen and examined on 09/10/2022   Brief hospital course: 82 year old female with history of HTN, hypothyroidism, depression/anxiety who comes to the hospital with diarrhea.  She was recently hospitalized at the end of 2023 with abdominal pain, septic shock, elevated lactic acid.  She was felt to have large intestinal ischemia and was taken to OR for an ex lap by Dr. Dema Severin, without any evidence of malperfusion of any of her intestine.  Was felt to have some sort of infectious colitis.  She was discharged to [facility], but in the last 2 to 3 weeks she has developed significant watery diarrhea.  She was treated for C. difficile colitis with p.o. vancomycin QID, and apparently finished antibiotics about 4 days prior to this admission.  She is not sure whether C. difficile was ever sent or she was treated empirically.  Despite treatment, she has been having persistent diarrhea with 5 watery bowel movements per day, and in the last week she has also noticed nighttime stool incontinence while asleep and wakes up soiled.  This is never happened to her, and prior to her surgery she has been actually constipated for most of her life.  She denies any blood in her stools, she does report occasional cramping with nighttime bowel movements.  She reports progressive weakness, failure to progress with PT, weight loss.  GI consulted, underwent a colonoscopy which showed findings concerning for Crohn's disease.  She was started on steroids   Assessment and Plan: Severe colitis, concern for Crohn's-patient with progressive diarrhea, 10 pound weight loss and progressive weakness mostly over the last several weeks.  It appears that she was empirically treated for C. difficile in her SNF but no C. difficile positive testing was available, and patient thinks it was never done -C. difficile and GI pathogen panel  negative this hospitalization -Gastroenterology consulted, she underwent a colonoscopy on 1/19 with diffuse severe inflammation in the sigmoid, descending and transverse colon, status post biopsies.  Dr. Cruzita Lederer discussed with GI, concern for Crohn's disease, she was started on steroids. -Pt had some continued loose stools that has since improved -GI had recommended continue steroids, not to wean below '40mg'$  prednisone daily, close follow up with GI  Active problems Hyponatremia -sodium stable at 134   Essential hypertension-continue home medications as below -BP stable and controlled   Hypothyroidism-continue Synthroid -TSH 4.935   Hypomagnesemia-corrected   Hypokalemia-K remains within normal limits   Dysphagia -on a regular diet after speech evaluation.    Anemia, of chronic disease-no bleeding, monitor   History of TIA-continue aspirin, statin   Depression/anxiety -continue home medications as below   Status post ex lap December 2023 - noted, remains stable  Oral sores/ulcerations -multiple well-demarcated sores across lower lip on exam. 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 Have prescribed -Continue magic mouthwash as needed      Subjective: Eager to participate with therapy  Physical Exam: Vitals:   09/09/22 1536 09/09/22 2111 09/10/22 0624 09/10/22 0749  BP: 120/69 (!) 126/59 118/87 128/79  Pulse: 87 73 99 (!) 102  Resp: '18 18 18 16  '$ Temp: 98.1 F (36.7 C) 97.8 F (36.6 C) 98 F (36.7 C) 98.4 F (36.9 C)  TempSrc:   Oral Oral  SpO2: 100% 99% 100% 100%  Weight:      Height:  General exam: Conversant, in no acute distress Respiratory system: normal chest rise, clear, no audible wheezing Cardiovascular system: regular rhythm, s1-s2 Gastrointestinal system: Nondistended, nontender, pos BS Central nervous system: No seizures, no tremors Extremities: No cyanosis, no joint deformities Skin: No rashes, no pallor Psychiatry: Affect  normal // no auditory hallucinations   Data Reviewed:  Labs reviewed: Na 134, K 3.8, Cr 0.52, Hgb 11.6  Family Communication: Pt in room, family not at bedside  Disposition: Status is: Inpatient Remains inpatient appropriate because: Severity of illness  Planned Discharge Destination:  [facility]    Author: Marylu Lund, MD 09/10/2022 2:36 PM  For on call review [email or web address].

## 2022-09-10 NOTE — Progress Notes (Signed)
Mobility Specialist Progress Note:    09/10/22 1629  Mobility  Activity Ambulated with assistance in hallway  Level of Assistance Minimal assist, patient does 75% or more  Assistive Device Front wheel walker  Distance Ambulated (ft) 300 ft  Activity Response Tolerated fair  Mobility Referral Yes  $Mobility charge 1 Mobility   Pt was agreeable to mobility session. Pt was focused on correcting gait via heel-strike method. Pt had LOB near EOS, recovered in chair and ambulated to room. Left pt in bed with alarm on, all needs met.   Royetta Crochet Mobility Specialist Please contact via [employer] or  Rehab office at [phone]

## 2022-09-11 ENCOUNTER — Other Ambulatory Visit (HOSPITAL_COMMUNITY): Payer: Self-pay

## 2022-09-11 LAB — COMPREHENSIVE METABOLIC PANEL
ALT: 21 U/L (ref 0–44)
AST: 21 U/L (ref 15–41)
Albumin: 2.2 g/dL — ABNORMAL LOW (ref 3.5–5.0)
Alkaline Phosphatase: 48 U/L (ref 38–126)
Anion gap: 13 (ref 5–15)
BUN: 16 mg/dL (ref 8–23)
CO2: 24 mmol/L (ref 22–32)
Calcium: 8.3 mg/dL — ABNORMAL LOW (ref 8.9–10.3)
Chloride: 96 mmol/L — ABNORMAL LOW (ref 98–111)
Creatinine, Ser: 0.6 mg/dL (ref 0.44–1.00)
GFR, Estimated: >60 mL/min (ref >60)
Glucose, Bld: 103 mg/dL — ABNORMAL HIGH (ref 70–99)
Potassium: 3.5 mmol/L (ref 3.5–5.1)
Sodium: 133 mmol/L — ABNORMAL LOW (ref 135–145)
Total Bilirubin: 0.4 mg/dL (ref 0.3–1.2)
Total Protein: 5 g/dL — ABNORMAL LOW (ref 6.5–8.1)

## 2022-09-11 LAB — CBC
HCT: 38 % (ref 36.0–46.0)
Hemoglobin: 11.9 g/dL — ABNORMAL LOW (ref 12.0–15.0)
MCH: 29.8 pg (ref 26.0–34.0)
MCHC: 31.3 g/dL (ref 30.0–36.0)
MCV: 95.2 fL (ref 80.0–100.0)
Platelets: 432 10*3/uL — ABNORMAL HIGH (ref 150–400)
RBC: 3.99 MIL/uL (ref 3.87–5.11)
RDW: 15.8 % — ABNORMAL HIGH (ref 11.5–15.5)
WBC: 7.3 10*3/uL (ref 4.0–10.5)
nRBC: 0 % (ref 0.0–0.2)

## 2022-09-11 MED ORDER — PREDNISONE 10 MG PO TABS
ORAL_TABLET | ORAL | 0 refills | Status: DC
  Filled 2022-09-11: qty 55, 29d supply, fill #0

## 2022-09-11 MED ORDER — PREDNISONE 10 MG PO TABS: 10.0000 mg | ORAL_TABLET | Freq: Every day | ORAL | Status: DC

## 2022-09-11 MED ORDER — OXYCODONE HCL 5 MG PO CAPS: 5.0000 mg | ORAL_CAPSULE | Freq: Four times a day (QID) | ORAL | 0 refills | Status: DC | PRN

## 2022-09-11 MED ORDER — PREDNISONE 10 MG PO TABS
10.0000 mg | ORAL_TABLET | Freq: Two times a day (BID) | ORAL | 0 refills | Status: DC
  Filled 2022-09-11: qty 10, 5d supply, fill #0

## 2022-09-11 MED ORDER — CHLORPROMAZINE HCL 10 MG PO TABS
10.0000 mg | ORAL_TABLET | Freq: Three times a day (TID) | ORAL | 0 refills | Status: DC | PRN
  Filled 2022-09-11: qty 15, 5d supply, fill #0

## 2022-09-11 MED ORDER — PREDNISONE 5 MG PO TABS
5.0000 mg | ORAL_TABLET | Freq: Every day | ORAL | 0 refills | Status: DC
  Filled 2022-09-11: qty 10, 10d supply, fill #0

## 2022-09-11 MED ORDER — PREDNISONE 10 MG PO TABS: 15.0000 mg | ORAL_TABLET | Freq: Two times a day (BID) | ORAL | Status: DC

## 2022-09-11 MED ORDER — PREDNISONE 10 MG PO TABS: 5.0000 mg | ORAL_TABLET | Freq: Every day | ORAL | Status: DC

## 2022-09-11 MED ORDER — PREDNISONE 10 MG PO TABS: 10.0000 mg | ORAL_TABLET | Freq: Two times a day (BID) | ORAL | Status: DC

## 2022-09-11 MED ORDER — PREDNISONE 20 MG PO TABS
20.0000 mg | ORAL_TABLET | Freq: Two times a day (BID) | ORAL | 0 refills | Status: AC
  Filled 2022-09-11: qty 8, 4d supply, fill #0

## 2022-09-11 MED ORDER — PREDNISONE 5 MG PO TABS
15.0000 mg | ORAL_TABLET | Freq: Every day | ORAL | 0 refills | Status: AC
  Filled 2022-09-11: qty 15, 5d supply, fill #0

## 2022-09-11 NOTE — Care Management Important Message (Signed)
Important Message  Patient Details  Name: [name] MRN: [id number] Date of Birth: September 29, 1940   Medicare Important Message Given:  Yes     Obrian Bulson [address] 09/11/2022, 1:49 PM

## 2022-09-11 NOTE — Progress Notes (Signed)
CSW spoke with patient at bedside - LPN present for conversation. All of patient's questions were answered and she is agreeable to today's discharge plan. Patient will be transported to [facility] via her daughter around noon today.   CSW spoke with [place] [facility] [employer] who states the equipment will be delivered shortly.  Madilyn Fireman, MSW, LCSW Transitions of Care  Clinical Social Worker II [phone]

## 2022-09-11 NOTE — Progress Notes (Signed)
Mobility Specialist Progress Note:   09/11/22 1040  Mobility  Activity Ambulated with assistance in room  Level of Assistance Total care  Assistive Device Front wheel walker  Distance Ambulated (ft) 40 ft  Activity Response RN notified;Tolerated poorly  Mobility Referral Yes  $Mobility charge 1 Mobility   Pt received in bed and agreeable. CG for first 72f of ambulation. After 21f pt c/o feeling faint and became pale and sweaty. Pt required total assist to get back to bed, RN quickly notified and assessed pt. Pt in bed with all needs met, call bell in reach, and RN in room.  KaAndrey Campanileobility Specialist Please contact via SecureChat or  Rehab office at [phone]

## 2022-09-11 NOTE — Discharge Summary (Signed)
Physician Discharge Summary  [name] YKD:[id number] DOB: 10-Oct-1940 DOA: 08/28/2022  PCP: Reynold Bowen, MD  Admit date: 08/28/2022 Discharge date: 09/11/2022  Admitted From: SNF Disposition:  SNF  Discharge Condition:Stable CODE STATUS:DNR Diet recommendation:Regular   Brief/Interim Summary: Patient is a16 year old female with history of HTN, hypothyroidism, depression/anxiety who comes to the hospital with diarrhea.  She was recently hospitalized at the end of 2023 with abdominal pain, septic shock, elevated lactic acid.  She was felt to have large intestinal ischemia and was taken to OR for an ex lap by Dr. Dema Severin, without any evidence of malperfusion of any of her intestine.  Was felt to have some sort of infectious colitis.  She was discharged to [place], but in the last 2 to 3 weeks she has developed significant watery diarrhea.  She was treated for C. difficile colitis with p.o. vancomycin QID, and apparently finished antibiotics about 4 days prior to this admission.   She reported progressive weakness, failure to progress with PT, weight loss.  GI consulted, underwent a colonoscopy which showed findings concerning for Crohn's disease.  She was started on steroids  .  Currently her diarrhea has been stable.  GI cleared her for discharge with outpatient follow-up.  She will be discharged on tapering dose of prednisone.  Medically stable for discharge today back to [facility]  Following problems were addressed during the hospitalization:  Severe colitis, concern for Crohn's-patient with progressive diarrhea, 10 pound weight loss and progressive weakness mostly over the last several weeks. -C. difficile and GI pathogen panel negative this hospitalization -Gastroenterology consulted, she underwent a colonoscopy on 1/19 with diffuse severe inflammation in the sigmoid, descending and transverse colon, status post biopsies.  Dr. Cruzita Lederer discussed with GI, concern for Crohn's disease, she was  started on steroids. -Pt had some continued loose stools that has since improved -GI had recommended continue steroids as tapering and close follow up with GI   Hyponatremia -sodium stable at 133,monitor BMP   Essential hypertension-continue home medications as below -BP stable and controlled   Hypothyroidism-continue Synthroid -TSH 4.935   Hypomagnesemia-corrected   Hypokalemia-K remains within normal limits   Dysphagia -on a regular diet after speech evaluation.    Anemia, of chronic disease-no bleeding, monitor   History of TIA-continue aspirin, statin   Depression/anxiety -continue home medications as below   Status post ex lap December 2023 - noted, remains stable   Oral sores/ulcerations -multiple well-demarcated sores across lower lip on exam. 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        Medication List     STOP taking these medications    lidocaine 2 % solution Commonly known as: XYLOCAINE   OXYGEN   senna 8.6 MG tablet Commonly known as: SENOKOT   vancomycin 125 MG capsule Commonly known as: VANCOCIN       TAKE these medications    acetaminophen 325 MG tablet Commonly known as: TYLENOL Take  650 mg by mouth every 6 (six) hours as needed for mild pain or headache.   ALPRAZolam 0.5 MG tablet Commonly known as: XANAX Take 1 tablet (0.5 mg total) by mouth 2 (two) times daily as needed. for anxiety   aspirin EC 81  MG tablet Take 1 tablet (81 mg total) by mouth daily. Swallow whole. What changed: additional instructions   buPROPion 75 MG tablet Commonly known as: WELLBUTRIN Take 75 mg by mouth daily.   calcium-vitamin D 500-200 MG-UNIT tablet Commonly known as: OSCAL WITH D Take 1 tablet by mouth daily.   chlorproMAZINE 10 MG tablet Commonly known as: THORAZINE Take 1 tablet (10 mg total) by mouth 3 (three) times daily as needed for hiccoughs.   Citrucel oral powder Generic drug: methylcellulose 2 tablespoons daily What changed:  how much to take how to take this when to take this additional instructions   DRY EYE RELIEF DROPS OP Place 1 drop into both eyes 3 (three) times daily as needed (for dry eyes).   DULoxetine 60 MG capsule Commonly known as: CYMBALTA Take 60 mg by mouth daily.   feeding supplement (KATE FARMS STANDARD 1.4) Liqd liquid Take 325 mLs by mouth 2 (two) times daily between meals.   (feeding supplement) PROSource Plus liquid Take 30 mLs by mouth 2 (two) times daily between meals.   levothyroxine 75 MCG tablet Commonly known as: SYNTHROID Take 75 mcg by mouth daily before breakfast.   lisinopril 20 MG tablet Commonly known as: ZESTRIL Take 1 tablet (20 mg total) by mouth daily.   metoprolol succinate 50 MG 24 hr tablet Commonly known as: TOPROL-XL Take 1 tablet (50 mg total) by mouth daily. Take with or immediately following a meal. NEED ANNUAL VISIT FOR FURTHER REFILLS   multivitamin with minerals tablet Take 1 tablet by mouth daily.   omeprazole 40 MG capsule Commonly known as: PRILOSEC Take 1 capsule (40 mg total) by mouth daily. NEED ANNUAL VISIT FOR FURTHER REFILLS What changed: additional instructions   ondansetron 4 MG disintegrating tablet Commonly known as: ZOFRAN-ODT Take 4 mg by mouth every 4 (four) hours as needed for nausea or vomiting.   oxycodone 5 MG capsule Commonly known as: OXY-IR Take 1 capsule (5 mg total) by mouth every 6  (six) hours as needed for pain.   predniSONE 20 MG tablet Commonly known as: DELTASONE Take 1 tablet (20 mg total) by mouth 2 (two) times daily with a meal for 4 days.   predniSONE 5 MG tablet Commonly known as: DELTASONE Take 3 tablets (15 mg total) by mouth daily with breakfast for 5 days. Start taking on: September 16, 2022   predniSONE 10 MG tablet Commonly known as: DELTASONE Take 1 tablet (10 mg total) by mouth 2 (two) times daily with a meal for 5 days. Start taking on: September 21, 2022   predniSONE 10 MG tablet Commonly known as: DELTASONE Take 1 tablet (10 mg total) by mouth daily with breakfast for 5 days. Start taking on: September 26, 2022   predniSONE 5 MG tablet Commonly known as: DELTASONE Take 1 tablet (5 mg total) by mouth daily with breakfast for 10 days. Start taking on: October 01, 2022   rosuvastatin 10 MG tablet Commonly known as: Crestor Take 1 tablet (10 mg total) by mouth daily.   saccharomyces boulardii 250 MG capsule Commonly known as: FLORASTOR Take 1 capsule (250 mg total) by mouth 2 (two) times daily for 14 days.   valACYclovir 1000 MG tablet Commonly  known as: VALTREX Take 1 tablet (1,000 mg total) by mouth 2 (two) times daily for 6 days.               Durable Medical Equipment  (From admission, onward)           Start     Ordered   09/10/22 1407  For home use only DME Bedside commode  Once       Question:  Patient needs a bedside commode to treat with the following condition  Answer:  Generalized weakness   09/10/22 1407   09/10/22 1406  For home use only DME lightweight manual wheelchair with seat cushion  Once       Comments: Patient suffers from severe colitis and hyponatremia which impairs their ability to perform daily activities like grooming, mobilization in the home.  A walker will not resolve  issue with performing activities of daily living. A wheelchair will allow patient to safely perform daily activities. Patient  is not able to propel themselves in the home using a standard weight wheelchair due to general weakness. Patient can self propel in the lightweight wheelchair. Length of need 6 months . Accessories: elevating leg rests (ELRs), wheel locks, extensions and anti-tippers.   09/10/22 1407            Allergies  Allergen Reactions   Morphine And Related Itching and Rash    Given in IV.    Consultations: GI   Procedures/Studies: DG Abd 1 View  Result Date: 08/29/2022 CLINICAL DATA:  Diarrhea and weakness. EXAM: ABDOMEN - 1 VIEW COMPARISON:  07/06/2022 FINDINGS: Gas visible in the nondistended stomach. Air is scattered through nondilated small bowel in the left abdomen with some air in stool visualized in the right colon. Bowel gas pattern is nonobstructive and no findings to suggest ileus. Curvilinear lucency overlies the left abdomen, indeterminate although pneumatosis would be a consideration. Fusion hardware noted lower lumbar spine. IMPRESSION: Nonobstructive bowel gas pattern. Linear lucency in the left abdomen is indeterminate. Pneumatosis cannot be excluded. Correlate clinically and consider CT abdomen/pelvis to further evaluate as warranted. Electronically Signed   By: Misty Stanley M.D.   On: 08/29/2022 13:33      Subjective: Patient seen and examined at bedside today.  Still having some diarrhea about 2-3 times a day.  We discussed about continuing the prednisone and GI follow-up as an outpatient.  She denies abdominal pain, nausea or vomiting.  Hemodynamically stable for discharge.  Long discussion held at the bedside about discharge planning  Discharge Exam: Vitals:   09/11/22 0356 09/11/22 0853  BP: 129/70 109/67  Pulse: 90 88  Resp: 18 16  Temp: 98.2 F (36.8 C) 98.1 F (36.7 C)  SpO2: 100% 99%   Vitals:   09/10/22 1952 09/11/22 0349 09/11/22 0356 09/11/22 0853  BP: 134/68  129/70 109/67  Pulse: 82  90 88  Resp: '19  18 16  '$ Temp: 98.2 F (36.8 C)  98.2 F (36.8 C)  98.1 F (36.7 C)  TempSrc: Oral  Oral Oral  SpO2: 98%  100% 99%  Weight:  57.3 kg    Height:        General: Pt is alert, awake, not in acute distress Cardiovascular: RRR, S1/S2 +, no rubs, no gallops Respiratory: CTA bilaterally, no wheezing, no rhonchi Abdominal: Soft, NT, ND, bowel sounds + Extremities: no edema, no cyanosis    The results of significant diagnostics from this hospitalization (including imaging, microbiology, ancillary and laboratory) are listed below  for reference.     Microbiology: No results found for this or any previous visit (from the past 240 hour(s)).   Labs: BNP (last 3 results) No results for input(s): "BNP" in the last 8760 hours. Basic Metabolic Panel: Recent Labs  Lab 09/07/22 0421 09/08/22 0422 09/09/22 0439 09/10/22 0712 09/11/22 0810  NA 133* 133* 131* 134* 133*  K 3.8 4.1 4.1 3.8 3.5  CL 101 99 97* 97* 96*  CO2 '23 27 26 25 24  '$ GLUCOSE 100* 134* 146* 94 103*  BUN '15 15 13 14 16  '$ CREATININE 0.57 0.57 0.49 0.52 0.60  CALCIUM 8.1* 8.0* 8.2* 8.6* 8.3*   Liver Function Tests: Recent Labs  Lab 09/07/22 0421 09/08/22 0422 09/09/22 0439 09/10/22 0712 09/11/22 0810  AST '18 16 21 19 21  '$ ALT '24 23 21 23 21  '$ ALKPHOS 50 53 56 52 48  BILITOT 0.3 0.3 0.3 0.3 0.4  PROT 4.7* 4.7* 4.7* 5.1* 5.0*  ALBUMIN 2.1* 2.1* 2.0* 2.3* 2.2*   No results for input(s): "LIPASE", "AMYLASE" in the last 168 hours. No results for input(s): "AMMONIA" in the last 168 hours. CBC: Recent Labs  Lab 09/07/22 0421 09/08/22 0422 09/09/22 0439 09/10/22 0712 09/11/22 0810  WBC 7.9 8.0 6.5 7.6 7.3  HGB 10.1* 10.8* 10.5* 11.6* 11.9*  HCT 31.5* 34.2* 33.3* 37.2 38.0  MCV 93.8 94.5 94.9 96.1 95.2  PLT 449* 471* 451* 466* 432*   Cardiac Enzymes: No results for input(s): "CKTOTAL", "CKMB", "CKMBINDEX", "TROPONINI" in the last 168 hours. BNP: Invalid input(s): "POCBNP" CBG: No results for input(s): "GLUCAP" in the last 168 hours. D-Dimer No results for  input(s): "DDIMER" in the last 72 hours. Hgb A1c No results for input(s): "HGBA1C" in the last 72 hours. Lipid Profile No results for input(s): "CHOL", "HDL", "LDLCALC", "TRIG", "CHOLHDL", "LDLDIRECT" in the last 72 hours. Thyroid function studies No results for input(s): "TSH", "T4TOTAL", "T3FREE", "THYROIDAB" in the last 72 hours.  Invalid input(s): "FREET3" Anemia work up No results for input(s): "VITAMINB12", "FOLATE", "FERRITIN", "TIBC", "IRON", "RETICCTPCT" in the last 72 hours. Urinalysis    Component Value Date/Time   COLORURINE AMBER (A) 07/06/2022 1641   APPEARANCEUR HAZY (A) 07/06/2022 1641   LABSPEC 1.020 07/06/2022 1641   PHURINE 5.0 07/06/2022 1641   GLUCOSEU NEGATIVE 07/06/2022 1641   GLUCOSEU NEGATIVE 05/06/2016 1708   HGBUR SMALL (A) 07/06/2022 1641   BILIRUBINUR NEGATIVE 07/06/2022 1641   BILIRUBINUR negative 06/17/2022 1429   KETONESUR NEGATIVE 07/06/2022 1641   PROTEINUR NEGATIVE 07/06/2022 1641   UROBILINOGEN 0.2 06/17/2022 1429   UROBILINOGEN 0.2 05/06/2016 1708   NITRITE NEGATIVE 07/06/2022 1641   LEUKOCYTESUR NEGATIVE 07/06/2022 1641   Sepsis Labs Recent Labs  Lab 09/08/22 0422 09/09/22 0439 09/10/22 0712 09/11/22 0810  WBC 8.0 6.5 7.6 7.3   Microbiology No results found for this or any previous visit (from the past 240 hour(s)).  Please note: You were cared for by a hospitalist during your hospital stay. Once you are discharged, your primary care physician will handle any further medical issues. Please note that NO REFILLS for any discharge medications will be authorized once you are discharged, as it is imperative that you return to your primary care physician (or establish a relationship with a primary care physician if you do not have one) for your post hospital discharge needs so that they can reassess your need for medications and monitor your lab values.    Time coordinating discharge: 40 minutes  SIGNED:   Shelly Coss,  MD  [facility] 09/11/2022, 10:20 AM Pager [phone]  If 7PM-7AM, please contact night-coverage [email or web address] Password [email or web address]

## 2022-09-12 ENCOUNTER — Encounter: Payer: Self-pay | Admitting: Internal Medicine

## 2022-09-12 NOTE — Telephone Encounter (Signed)
Sounds good

## 2022-09-12 NOTE — Telephone Encounter (Signed)
From patients son

## 2022-09-19 ENCOUNTER — Telehealth: Payer: Self-pay | Admitting: Internal Medicine

## 2022-09-19 NOTE — Telephone Encounter (Signed)
Pt requesting a referral to a dietitian. States she is in a facility and has Crohn's and wants to make sure she is making the right choices with the food she is eating. Please advise if ok for referral.

## 2022-09-19 NOTE — Telephone Encounter (Signed)
Sure.  Please feel free to refer

## 2022-09-19 NOTE — Telephone Encounter (Signed)
Inbound call from patient, is wishing to get referred to a nutritionist. Please advise.

## 2022-09-20 ENCOUNTER — Other Ambulatory Visit: Payer: Self-pay

## 2022-09-20 DIAGNOSIS — K50919 Crohn's disease, unspecified, with unspecified complications: Secondary | ICD-10-CM

## 2022-09-20 DIAGNOSIS — R634 Abnormal weight loss: Secondary | ICD-10-CM

## 2022-09-20 NOTE — Progress Notes (Signed)
Referral made to dietitian.

## 2022-09-20 NOTE — Telephone Encounter (Signed)
Referral entered in epic for pt to be seen by dietitian.

## 2022-09-24 ENCOUNTER — Encounter: Payer: Self-pay | Admitting: Nurse Practitioner

## 2022-09-24 ENCOUNTER — Ambulatory Visit: Payer: Medicare Other | Admitting: Nurse Practitioner

## 2022-09-24 VITALS — BP 120/68 | HR 79 | Ht 62.0 in | Wt 131.5 lb

## 2022-09-24 DIAGNOSIS — B370 Candidal stomatitis: Secondary | ICD-10-CM

## 2022-09-24 DIAGNOSIS — K529 Noninfective gastroenteritis and colitis, unspecified: Secondary | ICD-10-CM

## 2022-09-24 MED ORDER — PREDNISONE 10 MG PO TABS: ORAL_TABLET | ORAL | 0 refills | Status: DC

## 2022-09-24 MED ORDER — NYSTATIN 100000 UNIT/ML MT SUSP: OROMUCOSAL | 0 refills | Status: DC

## 2022-09-24 NOTE — Progress Notes (Signed)
  [name] I remember this patient from her inpatient stay. Colitis causing chronic diarrhea with negative infectious workup. Path was concerning for IBD, I suspect she may have Crohn's colitis.   Glad to hear she has been doing well with the steroids. I agree with tapering her down, question is what should we do for maintenance. Given her age and comorbidities may be reasonable to start her on Lialda 2.4gm / day while she is tapering predinsone and see how she does. I'd like to see her in the office in 4 weeks or so for reassessment, if you can book a follow up with me. Will consider fecal calprotectin at that time. If she does not do well on Lialda and we need to transition to biologic, my preference would be Entyvio given favorable safety profile. Will see over time if she needs that. May need flex sig in upcoming months to reassess pending her course. If she worsens on Lialda while tapering prednisone while waiting for her follow up she should contact us. Thanks

## 2022-09-24 NOTE — Progress Notes (Signed)
The patient was recently seen as an inpatient.  She saw Dr. Havery Moros who performed procedures.  She is requesting transfer to his care.  Certainly okay with me.  As such, I will defer management, moving forward, to Dr. Havery Moros.

## 2022-09-24 NOTE — Progress Notes (Signed)
Assessment    # 82 yo female recently hospitalized with severe diarrhea,  malnutrition  / weight loss and findings of diffuse, severe inflammation found in the sigmoid colon, in the descending colon, and in the transverse colon on flexible sigmoidoscopy. Right colon was not examined.  Biopsies raised concern for Crohn's. She has had significant improvement with steroids.   # Oral candidiasis in the setting of recent antibiotics and steroids  Plan   Today she will have completed ~ 14 days of Prednisone 20 mg BID.Starting tomorrow :   30 mg x 5 days then 25 mg x 5 days then 20 mg x 5 days then  15 mg x 5 days then  10 mg x  5 days   5 mg  x 5 days   5 mg every other day x 3 doses then stop.   Nystatin swish / swallow QID x 2 weeks. Diflucan preferred but she has hx of prolonged QT.    She is requesting transfer of care from Dr. Henrene Pastor to Dr. Havery Moros, I will discuss with them.   Will need to decide which if any long term medication patient will need if this is felt to be Crohn's. Will be in touch with her when I know more. Marland Kitchen    HPI    Chief complaint: hospital follow up   Hagen Cancilla is a 82 y.o. female known to Dr. Henrene Pastor with a past medical history of GERD with esophageal stricture, PUD, diverticulosis, hypertension, TIA, hypothyroidism , anxiety/depression , fibromyalgia, renal cancer. See PMH / [place] for additional details.   She was last seen in the office by Dr. Henrene Pastor July 2022 for evaluation of fecal leakage, chronic constipation.  From a GERD standpoint she was doing well on PPI.  She was started on Metamucil.   Interval History:   Aroosh had a prolonged hospitalization in November for septic shock. CT scan showed a moderately to severely dilated colon full of stool and there appeared to be some degree of wall thickening involving the sigmoid colon suggesting infectious or inflammatory colitis. There was concern for ischemic bowel .  She underwent an exploratory  laparotomy which didn't show any evidence for ischemia or any other source of sepsis. It was then assumed that she must of had infectious colitis. She was discharged to a SNF early December.   Jilisa was readmitted mid January with diarrhea, weight loss and reported recent C-diff infection at the SNF.  She had been treated with vancomycin without improvement in diarrhea.  We saw her in consultation on 08/29/2022, see that note for details.    Inpatient C. difficile / pathogens were negative.  She underwent an inpatient  flexible sigmoidoscopy with findings of severe inflammation in the sigmoid colon, descending colon and in the transverse colon.  The right colon was not examined.  While awaiting path she was started empirically on steroids.  Biopsies were suggestive of Crohn's disease.  She was eventually transitioned to a prednisone taper and discharged to [facility] on 09/11/22.   Alaja is doing better on steroids and also fiber. No longer having diarrhea or abdominal pain. She hasn't had any blood in her stools. Stools are more formed.   There was confusion about her discharge medications including the prednisone taper. I called [place] to clarify. It seems that today she has completed 2 weeks of 20 mg BID. She hasn't yet received the additional prednisone for the taper.  Previous GI Evaluation   08/30/21 sigmoidoscopy  -Poor bowel preparation.  Diffuse, severe inflammation found in the sigmoid colon, in the descending colon, and in the transverse colon.  Biopsied.  Internal hemorrhoids.  Normal rectosigmoid/rectum.  Right colon not evaluated due to severe inflammatory changes  Path  A. COLON, TRANSVERSE, BIOPSY:  Ulcer with granulation tissue and exudate.  Colonic fragments with focally altered crypt architecture.  Differential diagnosis includes Crohn's.  No granulomas identified.    Imaging  CT angio chest / abd / pelvis 07/05/22  No evidence of thoracic or abdominal  aortic dissection or aneurysm. Moderate to severe dilatation of the colon is noted with large amount of stool seen throughout the colon. There appears to be somedegree of wall thickening involving the sigmoid colon suggesting infectious or inflammatory colitis. Moderate size sliding-type hiatal hernia. Mild bibasilar subsegmental atelectasis.   Labs:     Latest Ref Rng & Units 09/11/2022    8:10 AM 09/10/2022    7:12 AM 09/09/2022    4:39 AM  CBC  WBC 4.0 - 10.5 K/uL 7.3  7.6  6.5   Hemoglobin 12.0 - 15.0 g/dL 11.9  11.6  10.5   Hematocrit 36.0 - 46.0 % 38.0  37.2  33.3   Platelets 150 - 400 K/uL 432  466  451        Latest Ref Rng & Units 09/11/2022    8:10 AM 09/10/2022    7:12 AM 09/09/2022    4:39 AM  Hepatic Function  Total Protein 6.5 - 8.1 g/dL 5.0  5.1  4.7   Albumin 3.5 - 5.0 g/dL 2.2  2.3  2.0   AST 15 - 41 U/L 21  19  21   $ ALT 0 - 44 U/L 21  23  21   $ Alk Phosphatase 38 - 126 U/L 48  52  56   Total Bilirubin 0.3 - 1.2 mg/dL 0.4  0.3  0.3      Past Medical History:  Diagnosis Date   C. difficile colitis    Cancer ([place]) 2014   kidney   Esophageal stricture    Fibromyalgia    GERD (gastroesophageal reflux disease)    Helicobacter pylori gastritis    Hypertension    Intention tremor    Lumbago    Memory loss    Osteoarthritis of spine    Pneumonia    hx of years ago    PUD (peptic ulcer disease)    PVD (peripheral vascular disease) ([place])     Past Surgical History:  Procedure Laterality Date   ABDOMINAL HYSTERECTOMY  1985   BIOPSY  08/30/2022   Procedure: BIOPSY;  Surgeon: Yetta Flock, MD;  Location: [place];  Service: Gastroenterology;;   BREAST ENHANCEMENT SURGERY  1974   CATARACT EXTRACTION Bilateral 2014   [facility]   COLONOSCOPY WITH PROPOFOL N/A 08/30/2022   Procedure: COLONOSCOPY WITH PROPOFOL;  Surgeon: Yetta Flock, MD;  Location: [facility] ENDOSCOPY;  Service: Gastroenterology;  Laterality: N/A;   LAPAROSCOPIC PARTIAL NEPHRECTOMY Left  April '13   [facility], Dr. Alinda Money   LAPAROTOMY N/A 07/05/2022   Procedure: EXPLORATORY LAPAROTOMY;  Surgeon: Ileana Roup, MD;  Location: [place];  Service: General;  Laterality: N/A;   LUMBAR FUSION  03/2003   L4-5   TONSILLECTOMY  1958   TUBAL LIGATION  1984    Current Medications, Allergies, Family History and Social History were reviewed in [facility] record.     Current Outpatient Medications  Medication Sig Dispense Refill   acetaminophen (TYLENOL) 325 MG tablet Take 650 mg by mouth every 6 (six) hours as needed for mild pain or headache.     ALPRAZolam (XANAX) 0.5 MG tablet Take 1 tablet (0.5 mg total) by mouth 2 (two) times daily as needed. for anxiety 5 tablet 1   aspirin EC 81 MG tablet Take 1 tablet (81 mg total) by mouth daily. Swallow whole. (Patient taking differently: Take 81 mg by mouth daily.) 30 tablet 12   buPROPion (WELLBUTRIN) 75 MG tablet Take 75 mg by mouth daily.     calcium-vitamin D (OSCAL WITH D) 500-200 MG-UNIT per tablet Take 1 tablet by mouth daily.     chlorproMAZINE (THORAZINE) 10 MG tablet Take 1 tablet (10 mg total) by mouth 3 (three) times daily as needed for hiccoughs. 15 tablet 0   DULoxetine (CYMBALTA) 60 MG capsule Take 60 mg by mouth daily.     Glycerin-Hypromellose-PEG 400 (DRY EYE RELIEF DROPS OP) Place 1 drop into both eyes 3 (three) times daily as needed (for dry eyes).     levothyroxine (SYNTHROID) 75 MCG tablet Take 75 mcg by mouth daily before breakfast.     lisinopril (ZESTRIL) 20 MG tablet Take 1 tablet (20 mg total) by mouth daily.  3   methylcellulose (CITRUCEL) oral powder 2 tablespoons daily (Patient taking differently: Take 1 packet by mouth daily.)     metoprolol succinate (TOPROL-XL) 50 MG 24 hr tablet Take 1 tablet (50 mg total) by mouth daily. Take with or immediately following a meal. NEED ANNUAL VISIT FOR FURTHER REFILLS 90 tablet 0   Multiple Vitamins-Minerals (MULTIVITAMIN WITH MINERALS) tablet Take 1  tablet by mouth daily.     Nutritional Supplements (,FEEDING SUPPLEMENT, PROSOURCE PLUS) liquid Take 30 mLs by mouth 2 (two) times daily between meals. 887 mL 0   Nutritional Supplements (FEEDING SUPPLEMENT, KATE FARMS STANDARD 1.4,) LIQD liquid Take 325 mLs by mouth 2 (two) times daily between meals. 325 mL 0   omeprazole (PRILOSEC) 40 MG capsule Take 1 capsule (40 mg total) by mouth daily. NEED ANNUAL VISIT FOR FURTHER REFILLS (Patient taking differently: Take 40 mg by mouth daily.) 90 capsule 0   ondansetron (ZOFRAN-ODT) 4 MG disintegrating tablet Take 4 mg by mouth every 4 (four) hours as needed for nausea or vomiting.     oxycodone (OXY-IR) 5 MG capsule Take 1 capsule (5 mg total) by mouth every 6 (six) hours as needed for pain. 10 capsule 0   predniSONE (DELTASONE) 10 MG tablet Take 1 tablet (10 mg total) by mouth 2 (two) times daily with a meal for 5 days. 10 tablet 0   predniSONE (DELTASONE) 10 MG tablet Take 2 tablets (20 mg total) by mouth 2 (two) times daily with a meal for 4 days, THEN 1.5 tablets (15 mg total) 2 (two) times daily with a meal for 5 days, THEN 1 tablet (10 mg total) 2 (two) times daily with a meal for 5 days, THEN 1 tablet (10 mg total) daily with breakfast for 5 days, THEN 0.5 tablets (5 mg total) daily with breakfast for 10 days. 55 tablet 0   [START ON 10/01/2022] predniSONE (DELTASONE) 5 MG tablet Take 1 tablet (5 mg total) by mouth daily with breakfast for 10 days. 10 tablet 0   rosuvastatin (CRESTOR) 10 MG tablet Take 1 tablet (10 mg total) by mouth daily. 30 tablet 11   saccharomyces boulardii (FLORASTOR) 250 MG capsule Take 1 capsule (250 mg total) by  mouth 2 (two) times daily for 14 days. 28 capsule 0   No current facility-administered medications for this visit.    Review of Systems: No chest pain. No shortness of breath. No urinary complaints.    Physical Exam  Wt Readings from Last 3 Encounters:  09/11/22 126 lb 5.2 oz (57.3 kg)  08/08/22 138 lb (62.6 kg)   07/16/22 166 lb 3.6 oz (75.4 kg)    BP 120/68 (BP Location: Left Arm, Patient Position: Sitting, Cuff Size: Normal)   Pulse 79   Ht 5' 2"$  (1.575 m)   Wt 131 lb 8 oz (59.6 kg)   SpO2 97%   BMI 24.05 kg/m  Constitutional:  Pleasant, generally well appearing female in a wheelchair in no acute distress. Psychiatric: Normal mood and affect. Behavior is normal. EENT: Pupils normal.  Conjunctivae are normal. No scleral icterus. Multiple thick white plaques on lips, tongue and roof of mouth.  Neck supple.  Cardiovascular: Normal rate, regular rhythm.  Pulmonary/chest: Effort normal and breath sounds normal. No wheezing, rales or rhonchi. Abdominal: Limited exam in wheelchair. Abdomen is soft, nondistended, nontender. Bowel sounds active throughout. There are no masses palpable. No hepatomegaly. Neurological: Alert and oriented to person place and time.   Tye Savoy, NP  09/24/2022, 8:32 AM  Cc:  Reynold Bowen, MD

## 2022-09-24 NOTE — Patient Instructions (Addendum)
We have sent the following medications to your pharmacy for you to pick up at your convenience: Prednisone taper, Nystatin suspension   30 mg  ( 3 tablets) for 5 days then 25 mg  ( 2 1/2 tablets) for 5 days then 20 mg  ( 2 tablets) for 5 days then  15 mg  ( 1 1/2 tablets) for 5 days then  10 mg  ( 1 tablet) daily x 5 days   5 mg  ( 1/2 tablet) x 5 days   5 mg  ( 1/2 tablet) every other day x 3 doses then stop.   Due to recent changes in healthcare laws, you may see the results of your imaging and laboratory studies on MyChart before your provider has had a chance to review them.  We understand that in some cases there may be results that are confusing or concerning to you. Not all laboratory results come back in the same time frame and the provider may be waiting for multiple results in order to interpret others.  Please give [place] 48 hours in order for your provider to thoroughly review all the results before contacting the office for clarification of your results.    I appreciate the opportunity to care for you. Tye Savoy, NP-C

## 2022-09-25 ENCOUNTER — Ambulatory Visit: Payer: Medicare Other | Admitting: Internal Medicine

## 2022-09-25 VITALS — BP 109/52 | HR 75 | Ht 62.0 in | Wt 131.0 lb

## 2022-09-25 DIAGNOSIS — E782 Mixed hyperlipidemia: Secondary | ICD-10-CM

## 2022-09-25 DIAGNOSIS — Z9889 Other specified postprocedural states: Secondary | ICD-10-CM

## 2022-09-25 DIAGNOSIS — I10 Essential (primary) hypertension: Secondary | ICD-10-CM

## 2022-09-25 MED ORDER — METOPROLOL SUCCINATE ER 25 MG PO TB24: 25.0000 mg | ORAL_TABLET | Freq: Every day | ORAL | 3 refills | Status: DC

## 2022-09-25 MED ORDER — ROSUVASTATIN CALCIUM 10 MG PO TABS: 10.0000 mg | ORAL_TABLET | Freq: Every day | ORAL | 11 refills | Status: DC

## 2022-09-25 MED ORDER — LISINOPRIL 10 MG PO TABS: 10.0000 mg | ORAL_TABLET | Freq: Every day | ORAL | 3 refills | Status: DC

## 2022-09-25 NOTE — Progress Notes (Signed)
Primary Physician/Referring:  Reynold Bowen, MD  Patient ID: [name], female    DOB: 01-Jul-1941, 82 y.o.   MRN: [id number]  Chief Complaint  Patient presents with  . Medication Management  . Follow-up   HPI:    Dorrian Shook  is a 82 y.o. female with past medical history significant for hypertension and fibromyalgia who is here for a follow-up visit.  Patient has had quite an extensive medical course since our last visit.  About a month ago she was in the hospital where she ended up having an exploratory laparotomy for suspected bowel ischemia.  A part of her intestine was removed and recovery has been taking quite some time.  Patient does not like the rehab facility where she is staying and she would really like to go home with home health care.  She has not yet had a stress test or her event monitor given her recent health scares.  Patient would like to do the event monitor when she goes back home and the stress test can certainly wait.  She does not like being outside of her home and she does not feel like she is being taking care of well at her current facility.  I discussed at length with patient and her son regarding recovery and that a prolonged course in this case is normal.  From a cardiac standpoint patient feels she is doing very well.  Patient denies chest pain, palpitations, diaphoresis, orthopnea, edema, claudication.  Past Medical History:  Diagnosis Date  . C. difficile colitis   . Cancer ([place]) 2014   kidney  . Esophageal stricture   . Fibromyalgia   . GERD (gastroesophageal reflux disease)   . Helicobacter pylori gastritis   . Hypertension   . Intention tremor   . Lumbago   . Memory loss   . Osteoarthritis of spine   . Pneumonia    hx of years ago   . PUD (peptic ulcer disease)   . PVD (peripheral vascular disease) ([place])    Past Surgical History:  Procedure Laterality Date  . ABDOMINAL HYSTERECTOMY  1985  . BIOPSY  08/30/2022   Procedure: BIOPSY;   Surgeon: Kendra Flock, MD;  Location: [facility] END[facility]COPY;  Service: Gastroenterology;;  . [place]  . CATARACT EXTRACTION Bilateral 2014   [facility]  . COLONOSCOPY WITH PROPOFOL N/A 08/30/2022   Procedure: COLONOSCOPY WITH PROPOFOL;  Surgeon: Kendra Flock, MD;  Location: [place];  Service: Gastroenterology;  Laterality: N/A;  . LAPAROSCOPIC PARTIAL NEPHRECTOMY Left April '13   [facility], Dr. Alinda Money  . LAPAROTOMY N/A 07/05/2022   Procedure: EXPLORATORY LAPAROTOMY;  Surgeon: Ileana Roup, MD;  Location: [place];  Service: General;  Laterality: N/A;  . LUMBAR FUSION  03/2003   L4-5  . TONSILLECTOMY  1958  . TUBAL LIGATION  1984   Family History  Problem Relation Age of Onset  . Heart disease Mother   . COPD Mother   . Thyroid disease Mother   . Diabetes Father   . Heart disease Father        CABG  . Stroke Father   . Parkinson's disease Father   . Heart disease Brother   . Stroke Brother   . Hyperlipidemia Brother   . Diabetes Daughter   . Hearing loss Brother   . Crohn's disease Other        neice  . Hypertension Son   . Diabetes Son   . Melanoma Son   .  Colon cancer Neg Hx   . Stomach cancer Neg Hx   . Esophageal cancer Neg Hx     Social History   Tobacco Use  . Smoking status: Never  . Smokeless tobacco: Never  Substance Use Topics  . Alcohol use: Yes    Alcohol/week: 7.0 standard drinks of alcohol    Types: 7 Glasses of wine per week    Comment: occasional glass of wine with dinner    Marital Status: Widowed  ROS  Review of Systems  Constitutional: Positive for malaise/fatigue.  Cardiovascular:  Negative for dyspnea on exertion and near-syncope.  Gastrointestinal:  Positive for abdominal pain and change in bowel habit.  Neurological:  Positive for light-headedness and weakness. Negative for dizziness.   Objective  Blood pressure (!) 109/52, pulse 75, height 5' 2"$  (1.575 m), weight 131 lb (59.4 kg), SpO2 98 %. Body mass  index is 23.96 kg/m.     09/25/2022    9:37 AM 09/24/2022   11:05 AM 09/11/2022   10:30 AM  Vitals with BMI  Height 5' 2"$  5' 2"$    Weight 131 lbs 131 lbs 8 oz   BMI [id number]   Systolic 0000000 [id number]  Diastolic 52 68 56  Pulse 75 79 110     Physical Exam Vitals reviewed.  Constitutional:      Appearance: Normal appearance.  HENT:     Head: Normocephalic and atraumatic.  Cardiovascular:     Rate and Rhythm: Normal rate and regular rhythm.     Pulses: Normal pulses.     Heart sounds: Murmur heard.  Pulmonary:     Effort: Pulmonary effort is normal.     Breath sounds: Normal breath sounds.  Abdominal:     General: Bowel sounds are normal.  Musculoskeletal:     Right lower leg: No edema.     Left lower leg: No edema.  Skin:    General: Skin is warm and dry.  Neurological:     Mental Status: She is alert.    Medications and allergies   Allergies  Allergen Reactions  . Morphine And Related Itching and Rash    Given in IV.     Medication list after today's encounter   Current Outpatient Medications:  .  acetaminophen (TYLENOL) 325 MG tablet, Take 650 mg by mouth every 6 (six) hours as needed for mild pain or headache., Disp: , Rfl:  .  ALPRAZolam (XANAX) 0.5 MG tablet, Take 1 tablet (0.5 mg total) by mouth 2 (two) times daily as needed. for anxiety, Disp: 5 tablet, Rfl: 1 .  aspirin EC 81 MG tablet, Take 1 tablet (81 mg total) by mouth daily. Swallow whole. (Patient taking differently: Take 81 mg by mouth daily.), Disp: 30 tablet, Rfl: 12 .  DULoxetine (CYMBALTA) 60 MG capsule, Take 60 mg by mouth daily., Disp: , Rfl:  .  Glycerin-Hypromellose-PEG 400 (DRY EYE RELIEF DROPS OP), Place 1 drop into both eyes 3 (three) times daily as needed (for dry eyes)., Disp: , Rfl:  .  levothyroxine (SYNTHROID) 88 MCG tablet, Take 88 mcg by mouth every morning., Disp: , Rfl:  .  lisinopril (ZESTRIL) 20 MG tablet, Take 1 tablet (20 mg total) by mouth daily., Disp: , Rfl: 3 .  metoprolol  succinate (TOPROL-XL) 50 MG 24 hr tablet, Take 1 tablet (50 mg total) by mouth daily. Take with or immediately following a meal. NEED ANNUAL VISIT FOR FURTHER REFILLS, Disp: 90 tablet, Rfl: 0 .  Multiple Vitamins-Minerals (MULTIVITAMIN  WITH MINERALS) tablet, Take 1 tablet by mouth daily., Disp: , Rfl:  .  omeprazole (PRILOSEC) 40 MG capsule, Take 1 capsule (40 mg total) by mouth daily. NEED ANNUAL VISIT FOR FURTHER REFILLS (Patient taking differently: Take 40 mg by mouth daily.), Disp: 90 capsule, Rfl: 0 .  ondansetron (ZOFRAN-ODT) 4 MG disintegrating tablet, Take 4 mg by mouth every 4 (four) hours as needed for nausea or vomiting., Disp: , Rfl:  .  predniSONE (DELTASONE) 10 MG tablet, Take 3 tablets (30 mg total) by mouth daily with breakfast for 5 days, THEN 2.5 tablets (25 mg total) daily with breakfast for 5 days, THEN 2 tablets (20 mg total) daily with breakfast for 5 days, THEN 1.5 tablets (15 mg total) daily with breakfast for 5 days, THEN 1 tablet (10 mg total) daily with breakfast for 5 days, THEN 0.5 tablets (5 mg total) daily with breakfast for 5 days, THEN 0.5 tablets (5 mg total) every other day for 3 days. Patient needs in Blister packs please., Disp: 100 tablet, Rfl: 0 .  calcium-vitamin D (OSCAL WITH D) 500-200 MG-UNIT per tablet, Take 1 tablet by mouth daily. (Patient not taking: Reported on 09/25/2022), Disp: , Rfl:  .  chlorproMAZINE (THORAZINE) 10 MG tablet, Take 1 tablet (10 mg total) by mouth 3 (three) times daily as needed for hiccoughs. (Patient not taking: Reported on 09/25/2022), Disp: 15 tablet, Rfl: 0 .  Nutritional Supplements (,FEEDING SUPPLEMENT, PROSOURCE PLUS) liquid, Take 30 mLs by mouth 2 (two) times daily between meals. (Patient not taking: Reported on 09/24/2022), Disp: 887 mL, Rfl: 0 .  Nutritional Supplements (FEEDING SUPPLEMENT, KATE FARMS STANDARD 1.4,) LIQD liquid, Take 325 mLs by mouth 2 (two) times daily between meals. (Patient not taking: Reported on 09/24/2022),  Disp: 325 mL, Rfl: 0 .  nystatin (MYCOSTATIN) 100000 UNIT/ML suspension, Take 400,000 units four times a day for 2 weeks (Patient not taking: Reported on 09/25/2022), Disp: 473 mL, Rfl: 0 .  rosuvastatin (CRESTOR) 10 MG tablet, Take 1 tablet (10 mg total) by mouth daily. (Patient not taking: Reported on 09/24/2022), Disp: 30 tablet, Rfl: 11  Laboratory examination:   Lab Results  Component Value Date   NA 133 (L) 09/11/2022   K 3.5 09/11/2022   CO2 24 09/11/2022   GLUCOSE 103 (H) 09/11/2022   BUN 16 09/11/2022   CREATININE 0.60 09/11/2022   CALCIUM 8.3 (L) 09/11/2022   GFRNONAA >60 09/11/2022       Latest Ref Rng & Units 09/11/2022    8:10 AM 09/10/2022    7:12 AM 09/09/2022    4:39 AM  CMP  Glucose 70 - 99 mg/dL 103  94  146   BUN 8 - 23 mg/dL 16  14  13   $ Creatinine 0.44 - 1.00 mg/dL 0.60  0.52  0.49   Sodium 135 - 145 mmol/L 133  134  131   Potassium 3.5 - 5.1 mmol/L 3.5  3.8  4.1   Chloride 98 - 111 mmol/L 96  97  97   CO2 22 - 32 mmol/L 24  25  26   $ Calcium 8.9 - 10.3 mg/dL 8.3  8.6  8.2   Total Protein 6.5 - 8.1 g/dL 5.0  5.1  4.7   Total Bilirubin 0.3 - 1.2 mg/dL 0.4  0.3  0.3   Alkaline Phos 38 - 126 U/L 48  52  56   AST 15 - 41 U/L 21  19  21   $ ALT 0 - 44 U/L  $21  23  21       D$ Latest Ref Rng & Units 09/11/2022    8:10 AM 09/10/2022    7:12 AM 09/09/2022    4:39 AM  CBC  WBC 4.0 - 10.5 K/uL 7.3  7.6  6.5   Hemoglobin 12.0 - 15.0 g/dL 11.9  11.6  10.5   Hematocrit 36.0 - 46.0 % 38.0  37.2  33.3   Platelets 150 - 400 K/uL 432  466  451     Lipid Panel Recent Labs    05/01/22 0757 07/06/22 0312  CHOL 141  --   TRIG 46 93  LDLCALC 80  --   VLDL 9  --   HDL 52  --   CHOLHDL 2.7  --      HEMOGLOBIN A1C Lab Results  Component Value Date   HGBA1C 5.7 (H) 04/30/2022   MPG 116.89 04/30/2022   TSH Recent Labs    04/30/22 1247 07/05/22 2143 08/29/22 0424  TSH 2.444 4.392 4.935*     External labs:     Radiology:    Cardiac Studies:    07/07/2022 Summary LE Venous Duplex:  RIGHT:  - There is no evidence of deep vein thrombosis in the lower extremity.  However, portions of this examination were limited- see technologist  comments above.  - No cystic structure found in the popliteal fossa.  LEFT:  - There is no evidence of deep vein thrombosis in the lower extremity.  However, portions of this examination were limited- see technologist  comments above.  - No cystic structure found in the popliteal fossa.    IMPRESSIONS ECHO 07/06/2022  1. Left ventricular ejection fraction, by estimation, is 50%. Left  ventricular ejection fraction by 2D MOD biplane is 45.8 %. The left  ventricle has low normal function. The left ventricle has no regional wall  motion abnormalities. Left ventricular  diastolic parameters are consistent with Grade I diastolic dysfunction  (impaired relaxation).   2. Right ventricular systolic function is normal. The right ventricular  size is normal.   3. The mitral valve is grossly normal. Trivial mitral valve  regurgitation.   4. The aortic valve is calcified. Aortic valve regurgitation is not  visualized. Aortic valve sclerosis is present, with no evidence of aortic  valve stenosis.    EKG:   06/27/2022: NSR with rate variation, normal R wave progression, no evidence of ischemia  06/21/2022: NSR with second degree type I AV block, prolonged QTc  Assessment   No diagnosis found.    No orders of the defined types were placed in this encounter.   No orders of the defined types were placed in this encounter.   Medications Discontinued During This Encounter  Medication Reason  . buPROPion (WELLBUTRIN) 75 MG tablet Completed Course     Recommendations:   Alsace Quirindongo is a 82 y.o.  female with HTN who is s/p ex-lap   S/P exploratory laparotomy  Patient currently in rehab facility She is distressed about her recovery and how long it is taking   Essential  hypertension Continue current cardiac medications. Encourage low-sodium diet, less than 2000 mg daily. BP very well controlled at this time   Dizziness Echocardiogram completed and reviewed Patient likely orthostatic and dehydrated Stress test pending   Follow-up in 3 months or sooner if needed    Floydene Flock, DO, [facility]  09/25/2022, 9:47 AM Office: [phone] Pager: [phone]

## 2022-09-26 ENCOUNTER — Telehealth: Payer: Self-pay | Admitting: Nurse Practitioner

## 2022-09-26 NOTE — Telephone Encounter (Signed)
Left message for pt to call back  °

## 2022-09-26 NOTE — Telephone Encounter (Signed)
Inbound call from pt requesting to speak with a nurse regarding HICCUPS,she said the hiccups are getting worse and worse , coming every 2/3 hours disturbing her sleep.Marland KitchenPlease advise

## 2022-09-27 ENCOUNTER — Telehealth: Payer: Self-pay

## 2022-09-27 ENCOUNTER — Encounter: Payer: Self-pay | Admitting: Internal Medicine

## 2022-09-27 MED ORDER — METOPROLOL SUCCINATE ER 25 MG PO TB24: 12.5000 mg | ORAL_TABLET | Freq: Every day | ORAL | 3 refills | Status: DC

## 2022-09-27 MED ORDER — LISINOPRIL 2.5 MG PO TABS: 2.5000 mg | ORAL_TABLET | Freq: Every day | ORAL | 3 refills | Status: DC

## 2022-09-27 NOTE — Telephone Encounter (Signed)
Patient called is very upset says she is not being taken seriously about her blood pressure issues. Says she spoke with pharmacist(Alicia) yesterday and was told to drink 4 bottles of water and that it would help her bp come up and her heart rate stabilize. Patient says it did not help.  Her bp yesterday morning was 105/63 hr 109, yesterday night 8pm bp was 104/71 hr 132. This morning her bp is 106/63 hr 102.  Says she feels that if she doesn't get the help she needs she is going to go into cardiac arrest.  Patient refuses to speak with Elmo Putt anymore concerning her bp. She only wants to speak with you.

## 2022-09-30 ENCOUNTER — Ambulatory Visit: Payer: Medicare Other | Admitting: Dietician

## 2022-09-30 NOTE — Telephone Encounter (Signed)
Pt stated that she had taken some prescribed medication for thrush that took care of the thrush and hiccups. Pt stated that she no long has any hiccups: Pt verbalized understanding with all questions answered.

## 2022-10-01 ENCOUNTER — Telehealth: Payer: Self-pay

## 2022-10-01 MED ORDER — MESALAMINE 1.2 G PO TBEC: 2.4000 g | DELAYED_RELEASE_TABLET | Freq: Every day | ORAL | 3 refills | Status: DC

## 2022-10-01 NOTE — Telephone Encounter (Signed)
-----   Message from Willia Craze, NP sent at 09/26/2022 11:44 AM EST ----- Mickel Baas,   Please call patient Let her know that the transfer of care to Dr. Havery Moros was accepted. He wants to start her on Lialda 2.4 grams daily if you would call in a months worth and 3 refills.   Also, he himself wants to see her in clinic in 4 weeks if you can arrange the appointment  Thanks ----- Message ----- From: Irene Shipper, MD Sent: 09/24/2022   1:56 PM EST To: Willia Craze, NP; Yetta Flock, MD     ----- Message ----- From: Willia Craze, NP Sent: 09/24/2022   1:31 PM EST To: Irene Shipper, MD; Yetta Flock, MD

## 2022-10-01 NOTE — Telephone Encounter (Signed)
Called and spoke with patient regarding recommendations. Pt would like RX sent to [facility]. I provided patient with her f/u appt information, pt states that she is not in her room at this time and asked that I mail her appt information. Pt confirmed address on file. Pt verbalized understanding and had no concerns at the end of the call.   Lialda RX sent to requested pharmacy. Appt information mailed and sent to patient via [place].

## 2022-10-03 ENCOUNTER — Ambulatory Visit: Payer: Medicare Other | Admitting: Neurology

## 2022-10-03 ENCOUNTER — Other Ambulatory Visit: Payer: Self-pay

## 2022-10-03 MED ORDER — METOPROLOL SUCCINATE ER 25 MG PO TB24: 25.0000 mg | ORAL_TABLET | Freq: Every day | ORAL | 1 refills | Status: DC

## 2022-10-04 ENCOUNTER — Other Ambulatory Visit: Payer: Self-pay | Admitting: Internal Medicine

## 2022-10-04 ENCOUNTER — Ambulatory Visit (HOSPITAL_COMMUNITY)
Admission: RE | Admit: 2022-10-04 | Discharge: 2022-10-04 | Disposition: A | Discharge status: Home / Self Care | Payer: Medicare Other | Source: Ambulatory Visit | Attending: Internal Medicine | Admitting: Internal Medicine

## 2022-10-04 DIAGNOSIS — M7989 Other specified soft tissue disorders: Secondary | ICD-10-CM

## 2022-10-04 MED ORDER — METOPROLOL SUCCINATE ER 25 MG PO TB24: 12.5000 mg | ORAL_TABLET | Freq: Four times a day (QID) | ORAL | 3 refills | Status: DC

## 2022-10-08 ENCOUNTER — Telehealth: Payer: Self-pay

## 2022-10-08 ENCOUNTER — Other Ambulatory Visit: Payer: Self-pay

## 2022-10-08 DIAGNOSIS — N3945 Continuous leakage: Secondary | ICD-10-CM

## 2022-10-08 DIAGNOSIS — M7989 Other specified soft tissue disorders: Secondary | ICD-10-CM

## 2022-10-08 MED ORDER — FUROSEMIDE 20 MG PO TABS: 20.0000 mg | ORAL_TABLET | Freq: Every day | ORAL | 0 refills | Status: DC

## 2022-10-08 NOTE — Telephone Encounter (Signed)
error 

## 2022-10-10 ENCOUNTER — Other Ambulatory Visit: Payer: Self-pay

## 2022-10-10 MED ORDER — METOPROLOL SUCCINATE ER 25 MG PO TB24: 12.5000 mg | ORAL_TABLET | Freq: Two times a day (BID) | ORAL | 3 refills | Status: DC

## 2022-10-14 ENCOUNTER — Encounter: Payer: Self-pay | Admitting: Internal Medicine

## 2022-10-14 ENCOUNTER — Telehealth: Payer: Self-pay

## 2022-10-14 ENCOUNTER — Ambulatory Visit: Payer: Medicare Other | Admitting: Internal Medicine

## 2022-10-14 VITALS — BP 107/54 | HR 94 | Resp 16 | Ht 62.0 in | Wt 136.8 lb

## 2022-10-14 DIAGNOSIS — M7989 Other specified soft tissue disorders: Secondary | ICD-10-CM

## 2022-10-14 DIAGNOSIS — I872 Venous insufficiency (chronic) (peripheral): Secondary | ICD-10-CM

## 2022-10-14 DIAGNOSIS — I10 Essential (primary) hypertension: Secondary | ICD-10-CM

## 2022-10-14 DIAGNOSIS — L03031 Cellulitis of right toe: Secondary | ICD-10-CM

## 2022-10-14 DIAGNOSIS — I5032 Chronic diastolic (congestive) heart failure: Secondary | ICD-10-CM

## 2022-10-14 MED ORDER — AMOXICILLIN-POT CLAVULANATE 500-125 MG PO TABS: 1.0000 | ORAL_TABLET | Freq: Three times a day (TID) | ORAL | 0 refills | Status: DC

## 2022-10-14 MED ORDER — FUROSEMIDE 40 MG PO TABS: 40.0000 mg | ORAL_TABLET | Freq: Every day | ORAL | 3 refills | Status: DC

## 2022-10-14 NOTE — Progress Notes (Signed)
Primary Physician/Referring:  Reynold Bowen, MD  Patient ID: [name], female    DOB: Sep 19, 1940, 82 y.o.   MRN: [id number]  Chief Complaint  Patient presents with   Hypertension   Follow-up   HPI:    [name]  is a 82 y.o. female with past medical history significant for hypertension and fibromyalgia who is here for a follow-up visit due to lower extremity edema. I did order a lower extremity venous duplex last week which was negative for acute DVT. She complains of pain in her toes, especially her right foot. She says it is red and swollen and very painful. Of note, she has been eating a lot of soup lately which has exacerbated her diastolic heart failure. She has been taking the lasix and wearing compression stockings but since she has been eating a lot of soup, he leg swelling has not gone down much. Patient is working with PT and OT at her assisted living facility.  Patient denies chest pain, palpitations, diaphoresis, orthopnea, edema, claudication.  Past Medical History:  Diagnosis Date   C. difficile colitis    Cancer ([place]) 2014   kidney   Esophageal stricture    Fibromyalgia    GERD (gastroesophageal reflux disease)    Helicobacter pylori gastritis    Hypertension    Intention tremor    Lumbago    Memory loss    Osteoarthritis of spine    Pneumonia    hx of years ago    PUD (peptic ulcer disease)    PVD (peripheral vascular disease) ([place])    Past Surgical History:  Procedure Laterality Date   ABDOMINAL HYSTERECTOMY  1985   BIOPSY  08/30/2022   Procedure: BIOPSY;  Surgeon: Yetta Flock, MD;  Location: [facility];  Service: Gastroenterology;;   BREAST ENHANCEMENT [place]   CATARACT EXTRACTION Bilateral 2014   shapiro   COLONOSCOPY WITH PROPOFOL N/A 08/30/2022   Procedure: COLONOSCOPY WITH PROPOFOL;  Surgeon: Yetta Flock, MD;  Location: [facility] ENDOSCOPY;  Service: Gastroenterology;  Laterality: N/A;   LAPAROSCOPIC PARTIAL NEPHRECTOMY  Left April '13   [facility], Dr. Alinda Money   LAPAROTOMY N/A 07/05/2022   Procedure: EXPLORATORY LAPAROTOMY;  Surgeon: Ileana Roup, MD;  Location: [facility];  Service: General;  Laterality: N/A;   LUMBAR FUSION  03/2003   L4-5   TONSILLECTOMY  1958   TUBAL LIGATION  1984   Family History  Problem Relation Age of Onset   Heart disease Mother    COPD Mother    Thyroid disease Mother    Diabetes Father    Heart disease Father        CABG   Stroke Father    Parkinson's disease Father    Heart disease Brother    Stroke Brother    Hyperlipidemia Brother    Diabetes Daughter    Hearing loss Brother    Crohn's disease Other        neice   Hypertension Son    Diabetes Son    Melanoma Son    Colon cancer Neg Hx    Stomach cancer Neg Hx    Esophageal cancer Neg Hx     Social History   Tobacco Use   Smoking status: Never   Smokeless tobacco: Never  Substance Use Topics   Alcohol use: Not Currently    Alcohol/week: 7.0 standard drinks of alcohol    Types: 7 Glasses of wine per week    Comment: Patient no  longer drinks alcohol due to her Chrohn's disease   Marital Status: Widowed  ROS  Review of Systems  Constitutional: Negative for malaise/fatigue.  Cardiovascular:  Positive for leg swelling. Negative for dyspnea on exertion and near-syncope.  Musculoskeletal:  Positive for joint swelling.  Gastrointestinal:  Negative for abdominal pain and change in bowel habit.  Neurological:  Positive for weakness. Negative for dizziness and light-headedness.   Objective  Blood pressure (!) 107/54, pulse 94, resp. rate 16, height '5\' 2"'$  (1.575 m), weight 136 lb 12.8 oz (62.1 kg), SpO2 97 %. Body mass index is 25.02 kg/m.     10/14/2022   11:05 AM 09/25/2022    9:37 AM 09/24/2022   11:05 AM  Vitals with BMI  Height '5\' 2"'$  '5\' 2"'$  '5\' 2"'$   Weight 136 lbs 13 oz 131 lbs 131 lbs 8 oz  BMI 25.01 [id number]  Systolic [id number]  Diastolic 54 52 68  Pulse 94 75 79     Physical Exam Vitals  reviewed.  Constitutional:      Appearance: Normal appearance.  HENT:     Head: Normocephalic and atraumatic.  Cardiovascular:     Rate and Rhythm: Normal rate and regular rhythm.     Pulses: Normal pulses.     Heart sounds: Murmur heard.  Pulmonary:     Effort: Pulmonary effort is normal.     Breath sounds: Normal breath sounds.  Abdominal:     General: Bowel sounds are normal.  Musculoskeletal:     Right lower leg: Edema present.     Left lower leg: Edema present.  Skin:    General: Skin is warm and dry.     Findings: Erythema (RLE cellulitis) present.  Neurological:     Mental Status: She is alert.     Medications and allergies   Allergies  Allergen Reactions   Morphine And Related Itching and Rash    Given in IV.     Medication list after today's encounter   Current Outpatient Medications:    acetaminophen (TYLENOL) 325 MG tablet, Take 650 mg by mouth every 6 (six) hours as needed for mild pain or headache., Disp: , Rfl:    ALPRAZolam (XANAX) 0.5 MG tablet, Take 1 tablet (0.5 mg total) by mouth 2 (two) times daily as needed. for anxiety, Disp: 5 tablet, Rfl: 1   amoxicillin-clavulanate (AUGMENTIN) 500-125 MG tablet, Take 1 tablet by mouth 3 (three) times daily for 10 days., Disp: 30 tablet, Rfl: 0   calcium-vitamin D (OSCAL WITH D) 500-200 MG-UNIT per tablet, Take 1 tablet by mouth daily., Disp: , Rfl:    DULoxetine (CYMBALTA) 60 MG capsule, Take 60 mg by mouth daily., Disp: , Rfl:    furosemide (LASIX) 40 MG tablet, Take 1 tablet (40 mg total) by mouth daily., Disp: 90 tablet, Rfl: 3   levothyroxine (SYNTHROID) 88 MCG tablet, Take 88 mcg by mouth every morning., Disp: , Rfl:    lisinopril (ZESTRIL) 2.5 MG tablet, Take 1 tablet (2.5 mg total) by mouth daily., Disp: 90 tablet, Rfl: 3   mesalamine (LIALDA) 1.2 g EC tablet, Take 2 tablets (2.4 g total) by mouth daily with breakfast., Disp: 60 tablet, Rfl: 3   metoprolol succinate (TOPROL XL) 25 MG 24 hr tablet, Take 0.5  tablets (12.5 mg total) by mouth 2 (two) times daily. Take 12.5 mg in the morning and at night. Take additional 12.5 mg for heart rates in 110s. Take full dose 25 mg tablet for heart rate >  125, Disp: 60 tablet, Rfl: 3   omeprazole (PRILOSEC) 40 MG capsule, Take 1 capsule (40 mg total) by mouth daily. NEED ANNUAL VISIT FOR FURTHER REFILLS (Patient taking differently: Take 40 mg by mouth daily.), Disp: 90 capsule, Rfl: 0   predniSONE (DELTASONE) 10 MG tablet, Take 3 tablets (30 mg total) by mouth daily with breakfast for 5 days, THEN 2.5 tablets (25 mg total) daily with breakfast for 5 days, THEN 2 tablets (20 mg total) daily with breakfast for 5 days, THEN 1.5 tablets (15 mg total) daily with breakfast for 5 days, THEN 1 tablet (10 mg total) daily with breakfast for 5 days, THEN 0.5 tablets (5 mg total) daily with breakfast for 5 days, THEN 0.5 tablets (5 mg total) every other day for 3 days. Patient needs in Blister packs please., Disp: 100 tablet, Rfl: 0   rosuvastatin (CRESTOR) 10 MG tablet, Take 1 tablet (10 mg total) by mouth daily., Disp: 30 tablet, Rfl: 11  Laboratory examination:   Lab Results  Component Value Date   NA 133 (L) 09/11/2022   K 3.5 09/11/2022   CO2 24 09/11/2022   GLUCOSE 103 (H) 09/11/2022   BUN 16 09/11/2022   CREATININE 0.60 09/11/2022   CALCIUM 8.3 (L) 09/11/2022   GFRNONAA >60 09/11/2022       Latest Ref Rng & Units 09/11/2022    8:10 AM 09/10/2022    7:12 AM 09/09/2022    4:39 AM  CMP  Glucose 70 - 99 mg/dL 103  94  146   BUN 8 - 23 mg/dL '16  14  13   '$ Creatinine 0.44 - 1.00 mg/dL 0.60  0.52  0.49   Sodium 135 - 145 mmol/L 133  134  131   Potassium 3.5 - 5.1 mmol/L 3.5  3.8  4.1   Chloride 98 - 111 mmol/L 96  97  97   CO2 22 - 32 mmol/L '24  25  26   '$ Calcium 8.9 - 10.3 mg/dL 8.3  8.6  8.2   Total Protein 6.5 - 8.1 g/dL 5.0  5.1  4.7   Total Bilirubin 0.3 - 1.2 mg/dL 0.4  0.3  0.3   Alkaline Phos 38 - 126 U/L 48  52  56   AST 15 - 41 U/L '21  19  21   '$ ALT 0 -  44 U/L '21  23  21       '$ Latest Ref Rng & Units 09/11/2022    8:10 AM 09/10/2022    7:12 AM 09/09/2022    4:39 AM  CBC  WBC 4.0 - 10.5 K/uL 7.3  7.6  6.5   Hemoglobin 12.0 - 15.0 g/dL 11.9  11.6  10.5   Hematocrit 36.0 - 46.0 % 38.0  37.2  33.3   Platelets 150 - 400 K/uL 432  466  451     Lipid Panel Recent Labs    05/01/22 0757 07/06/22 0312  CHOL 141  --   TRIG 46 93  LDLCALC 80  --   VLDL 9  --   HDL 52  --   CHOLHDL 2.7  --     HEMOGLOBIN A1C Lab Results  Component Value Date   HGBA1C 5.7 (H) 04/30/2022   MPG 116.89 04/30/2022   TSH Recent Labs    04/30/22 1247 07/05/22 2143 08/29/22 0424  TSH 2.444 4.392 4.935*    External labs:     Radiology:    Cardiac Studies:   07/07/2022 Summary LE Venous  Duplex:  RIGHT:  - There is no evidence of deep vein thrombosis in the lower extremity.  However, portions of this examination were limited- see technologist  comments above.  - No cystic structure found in the popliteal fossa.  LEFT:  - There is no evidence of deep vein thrombosis in the lower extremity.  However, portions of this examination were limited- see technologist  comments above.  - No cystic structure found in the popliteal fossa.    IMPRESSIONS ECHO 07/06/2022  1. Left ventricular ejection fraction, by estimation, is 50%. Left  ventricular ejection fraction by 2D MOD biplane is 45.8 %. The left  ventricle has low normal function. The left ventricle has no regional wall  motion abnormalities. Left ventricular  diastolic parameters are consistent with Grade I diastolic dysfunction  (impaired relaxation).   2. Right ventricular systolic function is normal. The right ventricular  size is normal.   3. The mitral valve is grossly normal. Trivial mitral valve  regurgitation.   4. The aortic valve is calcified. Aortic valve regurgitation is not  visualized. Aortic valve sclerosis is present, with no evidence of aortic  valve stenosis.    EKG:    06/27/2022: NSR with rate variation, normal R wave progression, no evidence of ischemia  06/21/2022: NSR with second degree type I AV block, prolonged QTc  Assessment     ICD-10-CM   1. Chronic venous insufficiency  [id number] Basic metabolic panel    2. Right leg swelling  123456 Basic Metabolic Panel (BMET)    Basic metabolic panel    3. Essential hypertension  I10 PCV ECHOCARDIOGRAM COMPLETE    Basic metabolic panel    4. Cellulitis of toe of right foot  L03.031     5. Chronic diastolic (congestive) heart failure (HCC)  I50.32        Orders Placed This Encounter  Procedures   Basic metabolic panel    Standing Status:   Future    Standing Expiration Date:   10/14/2023    Meds ordered this encounter  Medications   furosemide (LASIX) 40 MG tablet    Sig: Take 1 tablet (40 mg total) by mouth daily.    Dispense:  90 tablet    Refill:  3   amoxicillin-clavulanate (AUGMENTIN) 500-125 MG tablet    Sig: Take 1 tablet by mouth 3 (three) times daily for 10 days.    Dispense:  30 tablet    Refill:  0    Medications Discontinued During This Encounter  Medication Reason   aspirin EC 81 MG tablet Patient Preference   chlorproMAZINE (THORAZINE) 10 MG tablet Completed Course   Glycerin-Hypromellose-PEG 400 (DRY EYE RELIEF DROPS OP) Patient Preference   Multiple Vitamins-Minerals (MULTIVITAMIN WITH MINERALS) tablet Stop Taking at Discharge   Nutritional Supplements (,FEEDING SUPPLEMENT, PROSOURCE PLUS) liquid Stop Taking at Discharge   Nutritional Supplements (FEEDING SUPPLEMENT, KATE FARMS STANDARD 1.4,) LIQD liquid Stop Taking at Discharge   nystatin (MYCOSTATIN) 100000 UNIT/ML suspension Completed Course   ondansetron (ZOFRAN-ODT) 4 MG disintegrating tablet    furosemide (LASIX) 20 MG tablet      Recommendations:   [name] is a 82 y.o.  female with HTN who is s/p ex-lap   Chronic venous insufficiency Will measure patient for compression stockings Venous  insufficiency and her diastolic dysfunction are causing significant leg edema   Chronic diastolic (congestive) heart failure (HCC) Patient admits she has been eating a lot of soup lately We discussed that even low-sodium soups have  too much sodium for her to be eating daily Will increase Lasix to 40 mg daily. She will go get BMP today and then again in 1-2 weeks. I have instructed the patient to weigh herself daily Once she is euvolemic, I will order Zoll external fluid monitor Patient to schedule repeat echocardiogram   Cellulitis of toe of right foot Right leg swelling Augmentin sent, 10 day course for cellulitis   Essential hypertension Continue current cardiac medications. Patient enrolled in RPM and pressures have been steady. Encourage low-sodium diet, less than 2000 mg daily.   Hyperlipidemia Continue statin. Last LDL 80   Follow-up in 1 months or sooner if needed    Kendra Flock, DO, [facility]  10/14/2022, 12:25 PM Office: [phone] Pager: [phone]

## 2022-10-14 NOTE — Telephone Encounter (Signed)
Patient's home blood pressures are more normotensive than soft with recent medication decreases.  Systolic Blood Pressure mmHg -- 110.6 ([id number] - [id number]) Diastolic Blood Pressure mmHg -- 68.3 (53.0 - 87.0) Heart Rate bpm -- 90.6 (72.0 - 122.0)  10/14/22 4:54 AM -... 124 / 71 mmHg 87 bpm  10/14/22 4:53 AM -...        121 / 67 mmHg 85 bpm  10/13/22 7:45 PM -... 117 / 72 mmHg 83 bpm  10/13/22 6:30 AM -... 123 / 83 mmHg 87 bpm  10/12/22 7:59 PM -... 145 / 64 mmHg 100 bpm  10/12/22 6:45 AM -... 117 / 70 mmHg 84 bpm  10/11/22 8:15 PM  104 / 73 mmHg 97 bpm  10/11/22 6:22 AM -... 93 / 60 mmHg 90 bpm  10/11/22 6:22 AM -... 110 / 63 mmHg 89 bpm

## 2022-10-15 LAB — BASIC METABOLIC PANEL
BUN/Creatinine Ratio: 22 (ref 12–28)
BUN: 13 mg/dL (ref 8–27)
CO2: 24 mmol/L (ref 20–29)
Calcium: 8.9 mg/dL (ref 8.7–10.3)
Chloride: 101 mmol/L (ref 96–106)
Creatinine, Ser: 0.6 mg/dL (ref 0.57–1.00)
Glucose: 131 mg/dL — ABNORMAL HIGH (ref 70–99)
Potassium: 4.2 mmol/L (ref 3.5–5.2)
Sodium: 138 mmol/L (ref 134–144)
eGFR: 90 mL/min/{1.73_m2} (ref >59)

## 2022-10-16 ENCOUNTER — Telehealth: Payer: Self-pay | Admitting: Nurse Practitioner

## 2022-10-16 NOTE — Telephone Encounter (Signed)
Inbound call from patient, requesting refill for Prednisone, stated she spilled her medication and is afraid she will get thrush again.

## 2022-10-16 NOTE — Telephone Encounter (Signed)
Spoke with [name] and she doesn't need the prednisone, she needs a refill on her Nystatin sent to [facility] please. She spilled some of it and she is worried about the thrush coming back. May I send in her a refill?

## 2022-10-17 ENCOUNTER — Encounter (HOSPITAL_COMMUNITY): Payer: Self-pay

## 2022-10-17 ENCOUNTER — Other Ambulatory Visit: Payer: Self-pay

## 2022-10-17 ENCOUNTER — Emergency Department (HOSPITAL_COMMUNITY): Payer: Medicare Other

## 2022-10-17 ENCOUNTER — Inpatient Hospital Stay (HOSPITAL_COMMUNITY)
Admission: EM | Admit: 2022-10-17 | Discharge: 2022-10-19 | DRG: 178 | Disposition: A | Discharge status: Nursing Home (Includes ICF) | Payer: Medicare Other | Source: Skilled Nursing Facility | Attending: Family Medicine | Admitting: Family Medicine

## 2022-10-17 DIAGNOSIS — Z808 Family history of malignant neoplasm of other organs or systems: Secondary | ICD-10-CM

## 2022-10-17 DIAGNOSIS — E871 Hypo-osmolality and hyponatremia: Secondary | ICD-10-CM | POA: Diagnosis not present

## 2022-10-17 DIAGNOSIS — R112 Nausea with vomiting, unspecified: Secondary | ICD-10-CM | POA: Diagnosis not present

## 2022-10-17 DIAGNOSIS — Z8249 Family history of ischemic heart disease and other diseases of the circulatory system: Secondary | ICD-10-CM

## 2022-10-17 DIAGNOSIS — K5090 Crohn's disease, unspecified, without complications: Secondary | ICD-10-CM | POA: Insufficient documentation

## 2022-10-17 DIAGNOSIS — I959 Hypotension, unspecified: Secondary | ICD-10-CM | POA: Diagnosis not present

## 2022-10-17 DIAGNOSIS — K219 Gastro-esophageal reflux disease without esophagitis: Secondary | ICD-10-CM | POA: Diagnosis present

## 2022-10-17 DIAGNOSIS — U071 COVID-19: Secondary | ICD-10-CM | POA: Diagnosis not present

## 2022-10-17 DIAGNOSIS — R413 Other amnesia: Secondary | ICD-10-CM | POA: Diagnosis present

## 2022-10-17 DIAGNOSIS — I9589 Other hypotension: Secondary | ICD-10-CM | POA: Diagnosis present

## 2022-10-17 DIAGNOSIS — Z905 Acquired absence of kidney: Secondary | ICD-10-CM

## 2022-10-17 DIAGNOSIS — I110 Hypertensive heart disease with heart failure: Secondary | ICD-10-CM | POA: Diagnosis present

## 2022-10-17 DIAGNOSIS — Z8349 Family history of other endocrine, nutritional and metabolic diseases: Secondary | ICD-10-CM

## 2022-10-17 DIAGNOSIS — Z833 Family history of diabetes mellitus: Secondary | ICD-10-CM

## 2022-10-17 DIAGNOSIS — E039 Hypothyroidism, unspecified: Secondary | ICD-10-CM | POA: Diagnosis present

## 2022-10-17 DIAGNOSIS — M797 Fibromyalgia: Secondary | ICD-10-CM | POA: Diagnosis present

## 2022-10-17 DIAGNOSIS — I5032 Chronic diastolic (congestive) heart failure: Secondary | ICD-10-CM | POA: Diagnosis present

## 2022-10-17 DIAGNOSIS — R197 Diarrhea, unspecified: Secondary | ICD-10-CM | POA: Insufficient documentation

## 2022-10-17 DIAGNOSIS — E860 Dehydration: Secondary | ICD-10-CM | POA: Diagnosis present

## 2022-10-17 DIAGNOSIS — Z79890 Hormone replacement therapy (postmenopausal): Secondary | ICD-10-CM

## 2022-10-17 DIAGNOSIS — I872 Venous insufficiency (chronic) (peripheral): Secondary | ICD-10-CM

## 2022-10-17 DIAGNOSIS — Z885 Allergy status to narcotic agent status: Secondary | ICD-10-CM

## 2022-10-17 DIAGNOSIS — I10 Essential (primary) hypertension: Secondary | ICD-10-CM

## 2022-10-17 DIAGNOSIS — Z981 Arthrodesis status: Secondary | ICD-10-CM

## 2022-10-17 DIAGNOSIS — Z79899 Other long term (current) drug therapy: Secondary | ICD-10-CM

## 2022-10-17 DIAGNOSIS — Z825 Family history of asthma and other chronic lower respiratory diseases: Secondary | ICD-10-CM

## 2022-10-17 DIAGNOSIS — Z83438 Family history of other disorder of lipoprotein metabolism and other lipidemia: Secondary | ICD-10-CM

## 2022-10-17 DIAGNOSIS — E876 Hypokalemia: Secondary | ICD-10-CM | POA: Diagnosis not present

## 2022-10-17 DIAGNOSIS — M7989 Other specified soft tissue disorders: Secondary | ICD-10-CM

## 2022-10-17 DIAGNOSIS — Z66 Do not resuscitate: Secondary | ICD-10-CM | POA: Diagnosis present

## 2022-10-17 DIAGNOSIS — D649 Anemia, unspecified: Secondary | ICD-10-CM | POA: Insufficient documentation

## 2022-10-17 DIAGNOSIS — I739 Peripheral vascular disease, unspecified: Secondary | ICD-10-CM | POA: Diagnosis present

## 2022-10-17 DIAGNOSIS — Z823 Family history of stroke: Secondary | ICD-10-CM

## 2022-10-17 DIAGNOSIS — Z820 Family history of epilepsy and other diseases of the nervous system: Secondary | ICD-10-CM

## 2022-10-17 DIAGNOSIS — I358 Other nonrheumatic aortic valve disorders: Secondary | ICD-10-CM | POA: Diagnosis present

## 2022-10-17 LAB — URINALYSIS, ROUTINE W REFLEX MICROSCOPIC
Bilirubin Urine: NEGATIVE
Glucose, UA: NEGATIVE mg/dL
Hgb urine dipstick: NEGATIVE
Ketones, ur: NEGATIVE mg/dL
Leukocytes,Ua: NEGATIVE
Nitrite: NEGATIVE
Protein, ur: NEGATIVE mg/dL
Specific Gravity, Urine: 1.008 (ref 1.005–1.030)
pH: 6 (ref 5.0–8.0)

## 2022-10-17 LAB — CBC WITH DIFFERENTIAL/PLATELET
Abs Immature Granulocytes: 0.12 10*3/uL — ABNORMAL HIGH (ref 0.00–0.07)
Basophils Absolute: 0 10*3/uL (ref 0.0–0.1)
Basophils Relative: 0 %
Eosinophils Absolute: 0 10*3/uL (ref 0.0–0.5)
Eosinophils Relative: 0 %
HCT: 29.3 % — ABNORMAL LOW (ref 36.0–46.0)
Hemoglobin: 9.1 g/dL — ABNORMAL LOW (ref 12.0–15.0)
Immature Granulocytes: 1 %
Lymphocytes Relative: 31 %
Lymphs Abs: 2.9 10*3/uL (ref 0.7–4.0)
MCH: 31 pg (ref 26.0–34.0)
MCHC: 31.1 g/dL (ref 30.0–36.0)
MCV: 99.7 fL (ref 80.0–100.0)
Monocytes Absolute: 0.5 10*3/uL (ref 0.1–1.0)
Monocytes Relative: 5 %
Neutro Abs: 5.7 10*3/uL (ref 1.7–7.7)
Neutrophils Relative %: 63 %
Platelets: 298 10*3/uL (ref 150–400)
RBC: 2.94 MIL/uL — ABNORMAL LOW (ref 3.87–5.11)
RDW: 16.2 % — ABNORMAL HIGH (ref 11.5–15.5)
WBC: 9.2 10*3/uL (ref 4.0–10.5)
nRBC: 0 % (ref 0.0–0.2)

## 2022-10-17 LAB — COMPREHENSIVE METABOLIC PANEL
ALT: 19 U/L (ref 0–44)
AST: 23 U/L (ref 15–41)
Albumin: 2.9 g/dL — ABNORMAL LOW (ref 3.5–5.0)
Alkaline Phosphatase: 41 U/L (ref 38–126)
Anion gap: 11 (ref 5–15)
BUN: 19 mg/dL (ref 8–23)
CO2: 23 mmol/L (ref 22–32)
Calcium: 8.2 mg/dL — ABNORMAL LOW (ref 8.9–10.3)
Chloride: 98 mmol/L (ref 98–111)
Creatinine, Ser: 0.76 mg/dL (ref 0.44–1.00)
GFR, Estimated: >60 mL/min (ref >60)
Glucose, Bld: 131 mg/dL — ABNORMAL HIGH (ref 70–99)
Potassium: 3.3 mmol/L — ABNORMAL LOW (ref 3.5–5.1)
Sodium: 132 mmol/L — ABNORMAL LOW (ref 135–145)
Total Bilirubin: 0.4 mg/dL (ref 0.3–1.2)
Total Protein: 5.6 g/dL — ABNORMAL LOW (ref 6.5–8.1)

## 2022-10-17 LAB — LACTIC ACID, PLASMA: Lactic Acid, Venous: 1.1 mmol/L (ref 0.5–1.9)

## 2022-10-17 LAB — RESP PANEL BY RT-PCR (RSV, FLU A&B, COVID)  RVPGX2
Influenza A by PCR: NEGATIVE
Influenza B by PCR: NEGATIVE
Resp Syncytial Virus by PCR: NEGATIVE
SARS Coronavirus 2 by RT PCR: POSITIVE — AB

## 2022-10-17 LAB — PROTIME-INR
INR: 1 (ref 0.8–1.2)
Prothrombin Time: 13.2 seconds (ref 11.4–15.2)

## 2022-10-17 MED ORDER — LEVOTHYROXINE SODIUM 88 MCG PO TABS
88.0000 ug | ORAL_TABLET | Freq: Every morning | ORAL | Status: DC
  Administered 2022-10-18 – 2022-10-19 (×2): 88 ug via ORAL
  Filled 2022-10-17 (×2): qty 1

## 2022-10-17 MED ORDER — SODIUM CHLORIDE 0.9 % IV BOLUS
1000.0000 mL | Freq: Once | INTRAVENOUS | Status: AC
  Administered 2022-10-17: 1000 mL via INTRAVENOUS

## 2022-10-17 MED ORDER — ACETAMINOPHEN 650 MG RE SUPP: 650.0000 mg | Freq: Four times a day (QID) | RECTAL | Status: DC | PRN

## 2022-10-17 MED ORDER — DULOXETINE HCL 60 MG PO CPEP
60.0000 mg | ORAL_CAPSULE | Freq: Every day | ORAL | Status: DC
  Administered 2022-10-18 – 2022-10-19 (×2): 60 mg via ORAL
  Filled 2022-10-17 (×2): qty 1

## 2022-10-17 MED ORDER — ROSUVASTATIN CALCIUM 10 MG PO TABS: 10.0000 mg | ORAL_TABLET | Freq: Every day | ORAL | Status: DC

## 2022-10-17 MED ORDER — KETOROLAC TROMETHAMINE 15 MG/ML IJ SOLN
15.0000 mg | Freq: Once | INTRAMUSCULAR | Status: AC
  Administered 2022-10-17: 15 mg via INTRAVENOUS
  Filled 2022-10-17: qty 1

## 2022-10-17 MED ORDER — POTASSIUM CHLORIDE IN NACL 40-0.9 MEQ/L-% IV SOLN
INTRAVENOUS | Status: DC
  Filled 2022-10-17 (×4): qty 1000

## 2022-10-17 MED ORDER — OXYCODONE HCL 5 MG PO TABS: 5.0000 mg | ORAL_TABLET | ORAL | Status: DC | PRN

## 2022-10-17 MED ORDER — METOCLOPRAMIDE HCL 5 MG/ML IJ SOLN
10.0000 mg | Freq: Once | INTRAMUSCULAR | Status: AC
  Administered 2022-10-17: 10 mg via INTRAVENOUS
  Filled 2022-10-17: qty 2

## 2022-10-17 MED ORDER — ALPRAZOLAM 0.5 MG PO TABS
0.5000 mg | ORAL_TABLET | Freq: Two times a day (BID) | ORAL | Status: DC | PRN
  Administered 2022-10-17 – 2022-10-18 (×2): 0.5 mg via ORAL
  Filled 2022-10-17 (×2): qty 1

## 2022-10-17 MED ORDER — ENOXAPARIN SODIUM 40 MG/0.4ML IJ SOSY
40.0000 mg | PREFILLED_SYRINGE | INTRAMUSCULAR | Status: DC
  Administered 2022-10-17 – 2022-10-18 (×2): 40 mg via SUBCUTANEOUS
  Filled 2022-10-17 (×2): qty 0.4

## 2022-10-17 MED ORDER — NIRMATRELVIR/RITONAVIR (PAXLOVID)TABLET
3.0000 | ORAL_TABLET | Freq: Two times a day (BID) | ORAL | Status: DC
  Administered 2022-10-17 – 2022-10-19 (×4): 3 via ORAL
  Filled 2022-10-17: qty 30

## 2022-10-17 MED ORDER — SODIUM CHLORIDE 0.9 % IV BOLUS
250.0000 mL | Freq: Once | INTRAVENOUS | Status: AC
  Administered 2022-10-17: 250 mL via INTRAVENOUS

## 2022-10-17 MED ORDER — ONDANSETRON HCL 4 MG PO TABS: 4.0000 mg | ORAL_TABLET | Freq: Four times a day (QID) | ORAL | Status: DC | PRN

## 2022-10-17 MED ORDER — ACETAMINOPHEN 325 MG PO TABS
650.0000 mg | ORAL_TABLET | Freq: Four times a day (QID) | ORAL | Status: DC | PRN
  Administered 2022-10-17 – 2022-10-19 (×4): 650 mg via ORAL
  Filled 2022-10-17 (×4): qty 2

## 2022-10-17 MED ORDER — PREDNISONE 10 MG PO TABS
10.0000 mg | ORAL_TABLET | Freq: Every day | ORAL | Status: DC
  Administered 2022-10-18 – 2022-10-19 (×2): 10 mg via ORAL
  Filled 2022-10-17 (×2): qty 1

## 2022-10-17 MED ORDER — ONDANSETRON HCL 4 MG/2ML IJ SOLN: 4.0000 mg | Freq: Four times a day (QID) | INTRAMUSCULAR | Status: DC | PRN

## 2022-10-17 MED ORDER — DEXAMETHASONE SODIUM PHOSPHATE 10 MG/ML IJ SOLN
8.0000 mg | Freq: Once | INTRAMUSCULAR | Status: DC
  Filled 2022-10-17: qty 1

## 2022-10-17 MED ORDER — MESALAMINE 1.2 G PO TBEC
2.4000 g | DELAYED_RELEASE_TABLET | Freq: Every day | ORAL | Status: DC
  Administered 2022-10-18 – 2022-10-19 (×2): 2.4 g via ORAL
  Filled 2022-10-17 (×2): qty 2

## 2022-10-17 NOTE — Assessment & Plan Note (Signed)
TSH slightly high in January - Continue home levothyroxine -Check TSH in 2 to 4 weeks

## 2022-10-17 NOTE — Assessment & Plan Note (Addendum)
Due to COVID.  Low suspicion for reactivation of Cdiff at this time given normal WBC, no cramps, soft not liquid stool. Amoxicillin stopped  - Cautious imodium - Continue bowel regimen - Antiemetics

## 2022-10-17 NOTE — ED Provider Notes (Signed)
[facility] Provider Note   CSN: [id number] Arrival date & time: 10/17/22  1105     History  Chief Complaint  Patient presents with   Nausea   Diarrhea   Fever    [name] is a 82 y.o. female presenting to the ED with nausea, vomiting, fever, headache.  Patient reports onset of symptoms yesterday.  She presents from [facility].    She reports she is currently taking amoxicillin for possible "cellulitis of my right second toe".  The patient reports that she is a chronically low blood pressure baseline systolic pressure usually around 100.  She states that when her pressure drops even lower she will often get a headache.  She says that her doctor put her on metoprolol to help with baseline tachycardia but this appears to be affecting her heart rate.  She also feels dehydrated and reports that she has had ongoing diarrhea and incontinence issues since she was in the hospital.  Review of external records the patient was seen by cardiology and is known to have diastolic heart failure.  She is on Lasix and wears compression stockings.  Her gram in November of last year showed an EF of 50%, calcified aortic valve, trivial mitral regurg.  No left ventricular regional wall motion abnormalities.  In January approximately 1 month ago the patient was hospitalized for severe colitis concerning for Crohn's, found to have severe inflammation of the sigmoid descending and transverse colon status post biopsy with GI, and started on steroids.  She also had electrolyte derangements at that time.  HPI     Home Medications Prior to Admission medications   Medication Sig Start Date End Date Taking? Authorizing Provider  acetaminophen (TYLENOL) 325 MG tablet Take 650 mg by mouth every 6 (six) hours as needed for mild pain or headache.    [provider]  ALPRAZolam Duanne Moron) 0.5 MG tablet Take 1 tablet (0.5 mg total) by mouth  2 (two) times daily as needed. for anxiety 07/16/22   Charlynne Cousins, MD  amoxicillin-clavulanate (AUGMENTIN) 500-125 MG tablet Take 1 tablet by mouth 3 (three) times daily for 10 days. 10/14/22 10/24/22  Custovic, Collene Mares, DO  calcium-vitamin D (OSCAL WITH D) 500-200 MG-UNIT per tablet Take 1 tablet by mouth daily.    [provider]  DULoxetine (CYMBALTA) 60 MG capsule Take 60 mg by mouth daily.    [provider]  furosemide (LASIX) 40 MG tablet Take 1 tablet (40 mg total) by mouth daily. 10/14/22 01/12/23  Custovic, Collene Mares, DO  levothyroxine (SYNTHROID) 88 MCG tablet Take 88 mcg by mouth every morning. 09/13/22   [provider]  lisinopril (ZESTRIL) 2.5 MG tablet Take 1 tablet (2.5 mg total) by mouth daily. 09/27/22   Custovic, Collene Mares, DO  mesalamine (LIALDA) 1.2 g EC tablet Take 2 tablets (2.4 g total) by mouth daily with breakfast. 10/01/22   Armbruster, Carlota Raspberry, MD  metoprolol succinate (TOPROL XL) 25 MG 24 hr tablet Take 0.5 tablets (12.5 mg total) by mouth 2 (two) times daily. Take 12.5 mg in the morning and at night. Take additional 12.5 mg for heart rates in 110s. Take full dose 25 mg tablet for heart rate >125 10/10/22   Custovic, Collene Mares, DO  omeprazole (PRILOSEC) 40 MG capsule Take 1 capsule (40 mg total) by mouth daily. NEED ANNUAL VISIT FOR FURTHER REFILLS Patient taking differently: Take 40 mg by mouth daily. 04/29/17   Hoyt Koch,  MD  predniSONE (DELTASONE) 10 MG tablet Take 3 tablets (30 mg total) by mouth daily with breakfast for 5 days, THEN 2.5 tablets (25 mg total) daily with breakfast for 5 days, THEN 2 tablets (20 mg total) daily with breakfast for 5 days, THEN 1.5 tablets (15 mg total) daily with breakfast for 5 days, THEN 1 tablet (10 mg total) daily with breakfast for 5 days, THEN 0.5 tablets (5 mg total) daily with breakfast for 5 days, THEN 0.5 tablets (5 mg total) every other day for 3 days. Patient needs in Blister packs please. 09/24/22 10/27/22   Willia Craze, NP  rosuvastatin (CRESTOR) 10 MG tablet Take 1 tablet (10 mg total) by mouth daily. 09/25/22 09/25/23  Custovic, Collene Mares, DO      Allergies    Morphine and related    Review of Systems   Review of Systems  Physical Exam Updated Vital Signs BP (!) 143/66   Pulse 94   Temp 97.9 F (36.6 C) (Oral)   Resp 16   SpO2 100%  Physical Exam Constitutional:      General: She is not in acute distress. HENT:     Head: Normocephalic and atraumatic.  Eyes:     Conjunctiva/sclera: Conjunctivae normal.     Pupils: Pupils are equal, round, and reactive to light.  Cardiovascular:     Rate and Rhythm: Normal rate and regular rhythm.  Pulmonary:     Effort: Pulmonary effort is normal. No respiratory distress.  Musculoskeletal:     Comments: Edema of the lower extremity No ulcerations noted of the lower extremities, no visible signs of redness or erythema of the toes or feet  Skin:    General: Skin is warm and dry.  Neurological:     General: No focal deficit present.     Mental Status: She is alert. Mental status is at baseline.  Psychiatric:        Mood and Affect: Mood normal.        Behavior: Behavior normal.     ED Results / Procedures / Treatments   Labs (all labs ordered are listed, but only abnormal results are displayed) Labs Reviewed  RESP PANEL BY RT-PCR (RSV, FLU A&B, COVID)  RVPGX2 - Abnormal; Notable for the following components:      Result Value   SARS Coronavirus 2 by RT PCR POSITIVE (*)    All other components within normal limits  COMPREHENSIVE METABOLIC PANEL - Abnormal; Notable for the following components:   Sodium 132 (*)    Potassium 3.3 (*)    Glucose, Bld 131 (*)    Calcium 8.2 (*)    Total Protein 5.6 (*)    Albumin 2.9 (*)    All other components within normal limits  CBC WITH DIFFERENTIAL/PLATELET - Abnormal; Notable for the following components:   RBC 2.94 (*)    Hemoglobin 9.1 (*)    HCT 29.3 (*)    RDW 16.2 (*)    Abs  Immature Granulocytes 0.12 (*)    All other components within normal limits  CULTURE, BLOOD (ROUTINE X 2)  CULTURE, BLOOD (ROUTINE X 2)  LACTIC ACID, PLASMA  PROTIME-INR  URINALYSIS, ROUTINE W REFLEX MICROSCOPIC    EKG None  Radiology DG Chest 2 View  Result Date: 10/17/2022 CLINICAL DATA:  Suspected sepsis. EXAM: CHEST - 2 VIEW COMPARISON:  Chest radiographs 07/05/2022 and 04/30/2022 FINDINGS: Interval removal of the prior left internal jugular central venous catheter. Interval removal of the prior enteric tube.  Cardiac silhouette and mediastinal contours are within limits. The lungs are clear. No pleural effusion or pneumothorax. Mild-to-moderate multilevel degenerative disc changes of the thoracic spine. Mild levocurvature centered at T11. Partially calcified breast implants are again noted. IMPRESSION: 1. No radiographic evidence of pneumonia. 2. Interval removal of the prior left internal jugular central venous catheter and enteric tube. Electronically Signed   By: Yvonne Kendall M.D.   On: 10/17/2022 12:24    Procedures Procedures    Medications Ordered in ED Medications  nirmatrelvir/ritonavir (PAXLOVID) 3 tablet (has no administration in time range)  dexamethasone (DECADRON) injection 8 mg (has no administration in time range)  ketorolac (TORADOL) 15 MG/ML injection 15 mg (15 mg Intravenous Given 10/17/22 1227)  metoCLOPramide (REGLAN) injection 10 mg (10 mg Intravenous Given 10/17/22 1226)  sodium chloride 0.9 % bolus 1,000 mL (0 mLs Intravenous Stopped 10/17/22 1405)    ED Course/ Medical Decision Making/ A&P Clinical Course as of 10/17/22 1724  Thu Oct 17, 2022  1315 SARS Coronavirus 2 by RT PCR(!): POSITIVE [MT]  1649 Patient is COVID-positive.  Will start on Paxlovid here.  Given her labile blood pressures I do think is reasonable to monitor her her in the hospital overnight. [MT]  1655 Stable COVID + AMS/hypotensive poor PO intake. Lactic acid OK. IVF with response to BP.  [CC]    Clinical Course User Index [CC] Tretha Sciara, MD [MT] Langston Masker Carola Rhine, MD                             Medical Decision Making Amount and/or Complexity of Data Reviewed Labs: ordered. Decision-making details documented in ED Course. Radiology: ordered.  Risk Prescription drug management. Decision regarding hospitalization.   This patient presents to the ED with concern for generalized weakness. This involves an extensive number of treatment options, and is a complaint that carries with it a high risk of complications and morbidity.  The differential diagnosis includes viral illness versus anemia versus dehydration versus other  Additional history obtained from patient's daughter at bedside  I ordered and personally interpreted labs.  The pertinent results include: COVID-positive.  Remaining labs at baseline levels.  Hemoglobin was stable anemia  I ordered imaging studies including x-ray of the chest I independently visualized and interpreted imaging which showed no acute abnormalities  I agree with the radiologist interpretation  The patient was maintained on a cardiac monitor.  I personally viewed and interpreted the cardiac monitored which showed an underlying rhythm of: Sinus rhythm  Per my interpretation the patient's ECG shows no acute ischemic findings  I ordered medication including fluids for hydration, Toradol and Reglan for headache, Decadron for headache, Paxlovid for antiviral therapy started.  Patient's statin will need to be held in the hospital  I have reviewed the patients home medicines and have made adjustments as needed  Test Considered: Doubt acute PE in this clinical setting   After the interventions noted above, I reevaluated the patient and found that they have: stayed the same  Disposition:  After consideration of the diagnostic results and the patients response to treatment, I feel that the patent would benefit from hospital  observation overnight.  The patient had some transient hypotension here in the ED which may be autonomic versus vagal episode, did have improvement of her blood pressure at the time of admission, but given his labile blood pressures and her inability to care for self at home I think is reasonable  to keep her overnight.  The patient reports that she will make arrangements for better home health tomorrow and would anticipate going home tomorrow if she is doing well.         Final Clinical Impression(s) / ED Diagnoses Final diagnoses:  COVID-19  Hypotension, unspecified hypotension type    Rx / DC Orders ED Discharge Orders     None         Wyvonnia Dusky, MD 10/17/22 1724

## 2022-10-17 NOTE — Assessment & Plan Note (Signed)
Recent diagnosis - Continue prednisone taper - Continue mesalamine

## 2022-10-17 NOTE — ED Triage Notes (Signed)
BIBA from [facility] for n/v/d and fever x 2 days. Pt received 650 Tylenol, '4mg'$  Zofran PTA- is currently on amoxicillin for cellulitis. 101.4 temp 86 palp 20 LAC 137 cbg 100 HR

## 2022-10-17 NOTE — Assessment & Plan Note (Addendum)
No clinical bleeding, Hgb trending down

## 2022-10-17 NOTE — Assessment & Plan Note (Addendum)
Mag normal, K repleted

## 2022-10-17 NOTE — Assessment & Plan Note (Addendum)
Hypotension due to COVID, dehydration and vomiting No infiltrates on CXR, SpO2 normal.  Symptoms started 1 day PTA Symptomatically improving, but fever to 100.11F last night, still with tachycardia to 100 overnight and BP <90/60 overnight - Continue Paxlovid day 2 of 5 - Continue IV fluids

## 2022-10-17 NOTE — Hospital Course (Addendum)
[name] is an 82 y.o. F with recent Cdiff, relatively new diagnosis Crohn's disease who presented with COVID.  Admitted on Paxlovid, requiring IV fluids due to diarrhea, fatigue.

## 2022-10-17 NOTE — Assessment & Plan Note (Addendum)
Likely due to dehydration, asymptomatic, mild

## 2022-10-17 NOTE — H&P (Signed)
History and Physical    Patient: [name] DOB: Jan 09, 1941 DOA: 10/17/2022 DOS: the patient was seen and examined on 10/17/2022 PCP: Reynold Bowen, MD  Patient coming from: ALF  Chief Complaint:  Chief Complaint  Patient presents with   Nausea   Diarrhea   Fever       HPI:  [name] is an 82 y.o. F with recent Cdiff, relatively new diagnosis Crohn's disease, fibromyalgia, MCI, hypothyroidism, HTN, hx partial nephrectomy, and PVD who presented with 1 day headache, sore throat, cough, myalgias and weakness, then vomiting and diarrhea.  In the ER, lactate normal.  Na 132 (baseline), K 3.3 and renal function normal.  LFTs normal.  WBC normal, Hgb slightly below baseline of 10.  COVID+      Review of Systems  Constitutional:  Positive for fever and malaise/fatigue.  HENT:  Positive for sore throat.   Respiratory:  Positive for cough. Negative for shortness of breath and wheezing.   Cardiovascular:  Negative for chest pain and leg swelling.  Gastrointestinal:  Positive for diarrhea, nausea and vomiting. Negative for abdominal pain.  Musculoskeletal:  Positive for myalgias.  Neurological:  Positive for weakness.  All other systems reviewed and are negative.    Past Medical History:  Diagnosis Date   C. difficile colitis    Cancer ([place]) 2014   kidney   Esophageal stricture    Fibromyalgia    GERD (gastroesophageal reflux disease)    Helicobacter pylori gastritis    Hypertension    Intention tremor    Lumbago    Memory loss    Osteoarthritis of spine    Pneumonia    hx of years ago    PUD (peptic ulcer disease)    PVD (peripheral vascular disease) ([place])    Past Surgical History:  Procedure Laterality Date   ABDOMINAL HYSTERECTOMY  1985   BIOPSY  08/30/2022   Procedure: BIOPSY;  Surgeon: Yetta Flock, MD;  Location: [facility];  Service: Gastroenterology;;   BREAST ENHANCEMENT [place]   CATARACT EXTRACTION Bilateral 2014    [facility]   COLONOSCOPY WITH PROPOFOL N/A 08/30/2022   Procedure: COLONOSCOPY WITH PROPOFOL;  Surgeon: Yetta Flock, MD;  [facility] ENDOSCOPY;  Service: Gastroenterology;  Laterality: N/A;   LAPAROSCOPIC PARTIAL NEPHRECTOMY Left April '13   [facility], Dr. Alinda Money   LAPAROTOMY N/A 07/05/2022   Procedure: EXPLORATORY LAPAROTOMY;  Surgeon: Ileana Roup, MD;  Location: [facility];  Service: General;  Laterality: N/A;   LUMBAR FUSION  03/2003   L4-5   [place]   Social History:  reports that she has never smoked. She has never used smokeless tobacco. She reports that she does not currently use alcohol after a past usage of about 7.0 standard drinks of alcohol per week. She reports that she does not use drugs.  Allergies  Allergen Reactions   Morphine And Related Itching and Rash    Given in IV.    Family History  Problem Relation Age of Onset   Heart disease Mother    COPD Mother    Thyroid disease Mother    Diabetes Father    Heart disease Father        CABG   Stroke Father    Parkinson's disease Father    Heart disease Brother    Stroke Brother    Hyperlipidemia Brother    Diabetes Daughter    Hearing loss Brother  Crohn's disease Other        neice   Hypertension Son    Diabetes Son    Melanoma Son    Colon cancer Neg Hx    Stomach cancer Neg Hx    Esophageal cancer Neg Hx     Prior to Admission medications   Medication Sig Start Date End Date Taking? Authorizing Provider  acetaminophen (TYLENOL) 325 MG tablet Take 650 mg by mouth every 6 (six) hours as needed for mild pain or headache.    [provider]  ALPRAZolam Duanne Moron) 0.5 MG tablet Take 1 tablet (0.5 mg total) by mouth 2 (two) times daily as needed. for anxiety 07/16/22   Charlynne Cousins, MD  calcium-vitamin D (OSCAL WITH D) 500-200 MG-UNIT per tablet Take 1 tablet by mouth daily.    [provider]  DULoxetine (CYMBALTA) 60 MG capsule Take 60 mg by  mouth daily.    [provider]  furosemide (LASIX) 40 MG tablet Take 1 tablet (40 mg total) by mouth daily. 10/14/22 01/12/23  Custovic, Collene Mares, DO  levothyroxine (SYNTHROID) 88 MCG tablet Take 88 mcg by mouth every morning. 09/13/22   [provider]  lisinopril (ZESTRIL) 2.5 MG tablet Take 1 tablet (2.5 mg total) by mouth daily. 09/27/22   Custovic, Collene Mares, DO  mesalamine (LIALDA) 1.2 g EC tablet Take 2 tablets (2.4 g total) by mouth daily with breakfast. 10/01/22   Armbruster, Carlota Raspberry, MD  metoprolol succinate (TOPROL XL) 25 MG 24 hr tablet Take 0.5 tablets (12.5 mg total) by mouth 2 (two) times daily. Take 12.5 mg in the morning and at night. Take additional 12.5 mg for heart rates in 110s. Take full dose 25 mg tablet for heart rate >125 10/10/22   Custovic, Collene Mares, DO  omeprazole (PRILOSEC) 40 MG capsule Take 1 capsule (40 mg total) by mouth daily. NEED ANNUAL VISIT FOR FURTHER REFILLS Patient taking differently: Take 40 mg by mouth daily. 04/29/17   Hoyt Koch, MD  predniSONE (DELTASONE) 10 MG tablet Take 3 tablets (30 mg total) by mouth daily with breakfast for 5 days, THEN 2.5 tablets (25 mg total) daily with breakfast for 5 days, THEN 2 tablets (20 mg total) daily with breakfast for 5 days, THEN 1.5 tablets (15 mg total) daily with breakfast for 5 days, THEN 1 tablet (10 mg total) daily with breakfast for 5 days, THEN 0.5 tablets (5 mg total) daily with breakfast for 5 days, THEN 0.5 tablets (5 mg total) every other day for 3 days. Patient needs in Blister packs please. 09/24/22 10/27/22  Willia Craze, NP  rosuvastatin (CRESTOR) 10 MG tablet Take 1 tablet (10 mg total) by mouth daily. 09/25/22 09/25/23  Floydene Flock, DO    Physical Exam: Vitals:   10/17/22 1530 10/17/22 1543 10/17/22 1630 10/17/22 1730  BP: 94/60  (!) 143/66 (!) 110/55  Pulse: 75  94 93  Resp: '17  16 16  '$ Temp:  97.9 F (36.6 C)    TempSrc:  Oral    SpO2: 100%  100% 99%   Elderly adult female,  lying in bed, appears weak and tired, interactive though and oriented Sclera anicteric, conjunctiva pink, lids and lashes normal, dentures in place, no oral lesions, lips normal, oropharynx tacky dry RRR, no murmurs, no peripheral edema, no JVD, radial and DP pulses 2+ and symmetric Respiratory rate normal, lungs clear without rales or wheezes Abdomen soft without tenderness palpation or guarding, no ascites or distention Attention normal, affect appropriate,  judgment insight appear normal Strength generalized weakness in the upper extremities.  Symmetric, face symmetric, speech fluent    Data Reviewed: Chest x-ray, personally reviewed, shows no airspace disease or opacity Basic metabolic panel shows mild hyponatremia and hypokalemia, normal renal function, normal LFTs INR normal COVID test positive CBC shows mild anemia, not much different from baseline Lactic acid normal      Assessment and Plan: * COVID-19 virus infection Hypotension due to dehydration and vomiting No infiltrates on CXR, SpO2 normal.  Symptoms started yesterday. - IV fluids - Continue Paxlovid    Nausea vomiting and diarrhea Due to COVID.  Low suspicion for reactivation of Cdiff at this time.   - Stop amoxicillin (for toe, as the toe appears normal at this time)  Normocytic anemia Hemoglobin slightly lower than baseline, but no clinical bleeding  Inflammatory bowel disease (Crohn's disease) (HCC) Recent diagnosis - Continue prednisone taper - Continue mesalamine  Hypokalemia - Supplement potassium - Check magnesium  Hyponatremia Likely due to dehydration, asymptomatic, mild - IV fluids and recheck tomorrow  Hypothyroidism TSH slightly high in January - Continue home levothyroxine -Check TSH in 2 to 4 weeks         Advance Care Planning: DNR, otherwise full scope discussed with daughter  Consults: None needed  Family Communication: Daughter at the bedside  Severity of  Illness: The appropriate patient status for this patient is OBSERVATION. Observation status is judged to be reasonable and necessary in order to provide the required intensity of service to ensure the patient's safety. The patient's presenting symptoms, physical exam findings, and initial radiographic and laboratory data in the context of their medical condition is felt to place them at decreased risk for further clinical deterioration. Furthermore, it is anticipated that the patient will be medically stable for discharge from the hospital within 2 midnights of admission.   Author: Edwin Dada, MD 10/17/2022 5:58 PM  For on call review [email or web address].

## 2022-10-18 ENCOUNTER — Telehealth: Payer: Self-pay

## 2022-10-18 ENCOUNTER — Other Ambulatory Visit: Payer: Self-pay | Admitting: Nurse Practitioner

## 2022-10-18 DIAGNOSIS — Z981 Arthrodesis status: Secondary | ICD-10-CM | POA: Diagnosis not present

## 2022-10-18 DIAGNOSIS — I5032 Chronic diastolic (congestive) heart failure: Secondary | ICD-10-CM | POA: Diagnosis present

## 2022-10-18 DIAGNOSIS — I9589 Other hypotension: Secondary | ICD-10-CM | POA: Diagnosis present

## 2022-10-18 DIAGNOSIS — I358 Other nonrheumatic aortic valve disorders: Secondary | ICD-10-CM | POA: Diagnosis present

## 2022-10-18 DIAGNOSIS — Z825 Family history of asthma and other chronic lower respiratory diseases: Secondary | ICD-10-CM | POA: Diagnosis not present

## 2022-10-18 DIAGNOSIS — E876 Hypokalemia: Secondary | ICD-10-CM

## 2022-10-18 DIAGNOSIS — Z833 Family history of diabetes mellitus: Secondary | ICD-10-CM | POA: Diagnosis not present

## 2022-10-18 DIAGNOSIS — U071 COVID-19: Secondary | ICD-10-CM | POA: Diagnosis present

## 2022-10-18 DIAGNOSIS — R112 Nausea with vomiting, unspecified: Secondary | ICD-10-CM | POA: Diagnosis not present

## 2022-10-18 DIAGNOSIS — I959 Hypotension, unspecified: Secondary | ICD-10-CM | POA: Diagnosis present

## 2022-10-18 DIAGNOSIS — E039 Hypothyroidism, unspecified: Secondary | ICD-10-CM | POA: Diagnosis present

## 2022-10-18 DIAGNOSIS — K5090 Crohn's disease, unspecified, without complications: Secondary | ICD-10-CM | POA: Diagnosis present

## 2022-10-18 DIAGNOSIS — Z8249 Family history of ischemic heart disease and other diseases of the circulatory system: Secondary | ICD-10-CM | POA: Diagnosis not present

## 2022-10-18 DIAGNOSIS — Z885 Allergy status to narcotic agent status: Secondary | ICD-10-CM | POA: Diagnosis not present

## 2022-10-18 DIAGNOSIS — Z8349 Family history of other endocrine, nutritional and metabolic diseases: Secondary | ICD-10-CM | POA: Diagnosis not present

## 2022-10-18 DIAGNOSIS — Z66 Do not resuscitate: Secondary | ICD-10-CM | POA: Diagnosis present

## 2022-10-18 DIAGNOSIS — I739 Peripheral vascular disease, unspecified: Secondary | ICD-10-CM | POA: Diagnosis present

## 2022-10-18 DIAGNOSIS — I110 Hypertensive heart disease with heart failure: Secondary | ICD-10-CM | POA: Diagnosis present

## 2022-10-18 DIAGNOSIS — K219 Gastro-esophageal reflux disease without esophagitis: Secondary | ICD-10-CM | POA: Diagnosis present

## 2022-10-18 DIAGNOSIS — D649 Anemia, unspecified: Secondary | ICD-10-CM | POA: Diagnosis present

## 2022-10-18 DIAGNOSIS — K50919 Crohn's disease, unspecified, with unspecified complications: Secondary | ICD-10-CM

## 2022-10-18 DIAGNOSIS — Z820 Family history of epilepsy and other diseases of the nervous system: Secondary | ICD-10-CM | POA: Diagnosis not present

## 2022-10-18 DIAGNOSIS — E871 Hypo-osmolality and hyponatremia: Secondary | ICD-10-CM

## 2022-10-18 DIAGNOSIS — R197 Diarrhea, unspecified: Secondary | ICD-10-CM

## 2022-10-18 DIAGNOSIS — M797 Fibromyalgia: Secondary | ICD-10-CM | POA: Diagnosis present

## 2022-10-18 DIAGNOSIS — Z905 Acquired absence of kidney: Secondary | ICD-10-CM | POA: Diagnosis not present

## 2022-10-18 DIAGNOSIS — Z823 Family history of stroke: Secondary | ICD-10-CM | POA: Diagnosis not present

## 2022-10-18 DIAGNOSIS — E860 Dehydration: Secondary | ICD-10-CM | POA: Diagnosis present

## 2022-10-18 LAB — CBC
HCT: 26.7 % — ABNORMAL LOW (ref 36.0–46.0)
Hemoglobin: 8.2 g/dL — ABNORMAL LOW (ref 12.0–15.0)
MCH: 31.3 pg (ref 26.0–34.0)
MCHC: 30.7 g/dL (ref 30.0–36.0)
MCV: 101.9 fL — ABNORMAL HIGH (ref 80.0–100.0)
Platelets: 264 10*3/uL (ref 150–400)
RBC: 2.62 MIL/uL — ABNORMAL LOW (ref 3.87–5.11)
RDW: 16.1 % — ABNORMAL HIGH (ref 11.5–15.5)
WBC: 7 10*3/uL (ref 4.0–10.5)
nRBC: 0 % (ref 0.0–0.2)

## 2022-10-18 LAB — BASIC METABOLIC PANEL
Anion gap: 9 (ref 5–15)
BUN: 14 mg/dL (ref 8–23)
CO2: 21 mmol/L — ABNORMAL LOW (ref 22–32)
Calcium: 7.6 mg/dL — ABNORMAL LOW (ref 8.9–10.3)
Chloride: 103 mmol/L (ref 98–111)
Creatinine, Ser: 0.67 mg/dL (ref 0.44–1.00)
GFR, Estimated: >60 mL/min (ref >60)
Glucose, Bld: 94 mg/dL (ref 70–99)
Potassium: 3.7 mmol/L (ref 3.5–5.1)
Sodium: 133 mmol/L — ABNORMAL LOW (ref 135–145)

## 2022-10-18 LAB — MAGNESIUM: Magnesium: 1.9 mg/dL (ref 1.7–2.4)

## 2022-10-18 MED ORDER — SODIUM CHLORIDE 0.9 % IV BOLUS
250.0000 mL | Freq: Once | INTRAVENOUS | Status: AC
  Administered 2022-10-18: 250 mL via INTRAVENOUS

## 2022-10-18 MED ORDER — NYSTATIN 100000 UNIT/ML MT SUSP: 5.0000 mL | Freq: Four times a day (QID) | OROMUCOSAL | 1 refills | Status: DC

## 2022-10-18 MED ORDER — LOPERAMIDE HCL 2 MG PO CAPS
2.0000 mg | ORAL_CAPSULE | ORAL | Status: DC | PRN
  Administered 2022-10-18 – 2022-10-19 (×3): 2 mg via ORAL
  Filled 2022-10-18 (×5): qty 1

## 2022-10-18 NOTE — Telephone Encounter (Signed)
Refilled medication Has OV in 1 month with Dr. Havery Moros, keep that appointment.  If not improving follow up PCP or call us.

## 2022-10-18 NOTE — Telephone Encounter (Signed)
Patient is in hospital with covid. She can not send bp readings from her machine. She just wanted to let you know.

## 2022-10-18 NOTE — Telephone Encounter (Signed)
Patient informed and informed me she is in the hospital currently and has a "stuck heart". She said she will keep her Dr Havery Moros appointment.

## 2022-10-18 NOTE — Telephone Encounter (Signed)
Patient stated that she has blisters on her lips and is wanting to follow up on getting refill for Nystatin. Please advise.

## 2022-10-18 NOTE — Telephone Encounter (Signed)
Please advise , Nevin Bloodgood at the hospital.

## 2022-10-18 NOTE — Progress Notes (Addendum)
       CROSS COVER NOTE  Name: [name]  DOB: 1941-04-08  ANV:[id number]  DOA: 10/17/2022  Level of care: Med-Surg    Date of Service   10/18/2022     HPI/Events of Note   Notified by RN of hypotensive episode, BP 84/44 (57).  On admission patient was also hypotensive due to dehydration and vomiting.  Patient is currently on continuous IV fluid.  Will add on fluid bolus.   Interventions/ Plan   Bolus - total 500 cc        Raenette Rover, DNP, [place]

## 2022-10-18 NOTE — Progress Notes (Addendum)
  Progress Note   Patient: [name] SJG:[id number] DOB: March 09, 1941 DOA: 10/17/2022     0 DOS: the patient was seen and examined on 10/18/2022 at 9:58AM      Brief hospital course: [name] is an 82 y.o. F with recent Cdiff, relatively new diagnosis Crohn's disease who presented with COVID.  Admitted on Paxlovid, requiring IV fluids due to diarrhea, fatigue.     Assessment and Plan: * COVID-19 virus infection Hypotension due to COVID, dehydration and vomiting No infiltrates on CXR, SpO2 normal.  Symptoms started 1 day PTA Symptomatically improving, but fever to 100.60F last night, still with tachycardia to 100 overnight and BP <90/60 overnight - Continue Paxlovid day 2 of 5 - Continue IV fluids     Nausea vomiting and diarrhea Due to COVID.  Low suspicion for reactivation of Cdiff at this time given normal WBC, no cramps, soft not liquid stool. Amoxicillin stopped  - Cautious imodium - Continue bowel regimen - Antiemetics  Normocytic anemia No clinical bleeding, Hgb trending down  Inflammatory bowel disease (Crohn's disease) (HCC) Recent diagnosis - Continue prednisone taper - Continue mesalamine  Hypokalemia Mag normal, K repleted  Hyponatremia Likely due to dehydration, asymptomatic, mild  Hypothyroidism TSH slightly high in January - Continue home levothyroxine -Check TSH in 2 to 4 weeks          Subjective: Still extremely weak, thankfully no more vomiting, but still having frequent soft stools, without blood.  No respiratory symptoms.  Other than severe headache, severe generalized fatigue, malaise, myalgias     Physical Exam: BP (!) 91/52 (BP Location: Right Arm)   Pulse 84   Temp 98.1 F (36.7 C)   Resp 17   SpO2 99%   Thin elderly female, lying in bed, weak, but interactive Tachycardic, regular, no murmurs, no peripheral edema Respiratory rate normal, lungs clear without rales or wheezes Abdomen soft without tenderness palpation  or guarding, no ascites or distention Attention normal, affect blunted, judgment insight appear normal, severe generalized weakness, face symmetric, speech fluent    Data Reviewed: Basic metabolic panel shows sodium 133, no change, magnesium normal, potassium improved Urinalysis unremarkable Hemoglobin down to 8.2  Family Communication: Son by phone    Disposition: Status is: Inpatient Patient was admitted with [place], she still has tachycardia up to 100 bpm overnight, hypotension, with severe generalized weakness requiring ongoing IV fluids         Author: Edwin Dada, MD 10/18/2022 11:39 AM  For on call review [email or web address].

## 2022-10-19 ENCOUNTER — Other Ambulatory Visit (HOSPITAL_COMMUNITY): Payer: Self-pay

## 2022-10-19 LAB — CBC
HCT: 23.7 % — ABNORMAL LOW (ref 36.0–46.0)
Hemoglobin: 7.5 g/dL — ABNORMAL LOW (ref 12.0–15.0)
MCH: 31.4 pg (ref 26.0–34.0)
MCHC: 31.6 g/dL (ref 30.0–36.0)
MCV: 99.2 fL (ref 80.0–100.0)
Platelets: 231 10*3/uL (ref 150–400)
RBC: 2.39 MIL/uL — ABNORMAL LOW (ref 3.87–5.11)
RDW: 15.6 % — ABNORMAL HIGH (ref 11.5–15.5)
WBC: 5.3 10*3/uL (ref 4.0–10.5)
nRBC: 0 % (ref 0.0–0.2)

## 2022-10-19 MED ORDER — NIRMATRELVIR/RITONAVIR (PAXLOVID)TABLET
3.0000 | ORAL_TABLET | Freq: Two times a day (BID) | ORAL | 0 refills | Status: AC
  Filled 2022-10-19: qty 30, 5d supply, fill #0

## 2022-10-19 MED ORDER — ONDANSETRON HCL 4 MG PO TABS
4.0000 mg | ORAL_TABLET | Freq: Four times a day (QID) | ORAL | 0 refills | Status: DC | PRN
  Filled 2022-10-19: qty 20, 5d supply, fill #0

## 2022-10-19 NOTE — Evaluation (Addendum)
Physical Therapy Evaluation Patient Details Name: [name] MRN: [id number] DOB: April 30, 1941 Today's Date: 10/19/2022  History of Present Illness  82 yo female admitted with COVID, N/V/D, hypotension. Hx of Cdiff, Chron's disease, fibromyalgia, anxiety, intentional tremor, PVD, memory loss, OA  Clinical Impression  On eval, pt was Min A for mobility. Practiced performing transfers to/from recliner and sit to stand transitions. Assist required varied between close Min guard A and Min A. Pt presents with general weakness, decreased activity tolerance, and impaired ability to perform transfers. At baseline, pt was able to transfer bed<>WC at [facility] Ind level. Discussed d/c plan on today-pt would like to d/c back to [place]. She is aware that she will need 24/7 supervision/assist initially until she returns to her baseline. She stated she plans to arrange this through [facility] Discussed this plan with pt and her daughter-both are in agreement with this plan.        Recommendations for follow up therapy are one component of a multi-disciplinary discharge planning process, led by the attending physician.  Recommendations may be updated based on patient status, additional functional criteria and insurance authorization.  Follow Up Recommendations Home health PT      Assistance Recommended at Discharge Frequent or constant Supervision/Assistance (recommend 24/7 supervision assist until cleared by HHPT to go back to intermittent assist)   Patient can return home with the following  A little help with walking and/or transfers;A little help with bathing/dressing/bathroom;Assistance with cooking/housework;Direct supervision/assist for medications management;Direct supervision/assist for financial management;Assist for transportation    Equipment Recommendations None recommended by PT  Recommendations for Other Services       Functional Status Assessment Patient has had a recent  decline in their functional status and demonstrates the ability to make significant improvements in function in a reasonable and predictable amount of time.     Precautions / Restrictions Precautions Precautions: Fall Restrictions Weight Bearing Restrictions: No      Mobility  Bed Mobility Overal bed mobility: Modified Independent             General bed mobility comments: HOB elevated. Pt used bedrail (has adjustable bed with railings at home)    Transfers Overall transfer level: Needs assistance Equipment used: Rolling walker (2 wheels) Transfers: Sit to/from Stand, Bed to chair/wheelchair/BSC             General transfer comment: Step pivot x 4 (x2 without device-pt using armrests of recliner, x2 with RW). Assist level varied between close Min guard A and Min A. LOB posteriorly intermittently. Cues provided as needed/requested from pt. She tends to talk herself through steps when mobilizing. Afterwards, worked on sit to stands x 8-last 3 times were [employer] guard A-improved with practice.    Ambulation/Gait               General Gait Details: nonambulatory  Stairs            Wheelchair Mobility    Modified Rankin (Stroke Patients Only)       Balance Overall balance assessment: Needs assistance         Standing balance support: Reliant on assistive device for balance, Bilateral upper extremity supported Standing balance-Leahy Scale: Poor                               Pertinent Vitals/Pain Pain Assessment Pain Assessment: No/denies pain    Home Living Family/patient expects to be discharged to::  Assisted living Living Arrangements: Alone Available Help at Discharge: Available PRN/intermittently Type of Home: Assisted living Home Access: Level entry       Home Layout: One level Home Equipment: Wheelchair - Publishing copy (2 wheels);Cane - single point;Grab bars - tub/shower      Prior Function Prior Level of Function  : Needs assist             Mobility Comments: transfers to a w/c mod ind--stand pivot, most recently not ambulatory ADLs Comments: needs assist for all aspects of care-bathing, dressing meals, meds  (come by 2x/day currently). pt states she does have an option for 24/7 if need with [facility]"     Hand Dominance        Extremity/Trunk Assessment   Upper Extremity Assessment Upper Extremity Assessment: Generalized weakness    Lower Extremity Assessment Lower Extremity Assessment: Generalized weakness (pes cavus bilaterally)    Cervical / Trunk Assessment Cervical / Trunk Assessment: Normal  Communication   Communication: No difficulties  Cognition Arousal/Alertness: Awake/alert Behavior During Therapy: WFL for tasks assessed/performed Overall Cognitive Status: Within Functional Limits for tasks assessed                                 General Comments: repeats cues to herself-talking herself through steps when mobilizing        General Comments      Exercises     Assessment/Plan    PT Assessment Patient needs continued PT services  PT Problem List Decreased strength;Decreased balance;Decreased mobility;Decreased knowledge of use of DME       PT Treatment Interventions DME instruction;Functional mobility training;Balance training;Patient/family education;Therapeutic activities;Therapeutic exercise    PT Goals (Current goals can be found in the Care Plan section)  Acute Rehab PT Goals Patient Stated Goal: home today PT Goal Formulation: With patient/family Time For Goal Achievement: 11/02/22 Potential to Achieve Goals: Good    Frequency Min 3X/week     Co-evaluation               AM-PAC PT "6 Clicks" Mobility  Outcome Measure Help needed turning from your back to your side while in a flat bed without using bedrails?: None Help needed moving from lying on your back to sitting on the side of a flat bed without using bedrails?:  None Help needed moving to and from a bed to a chair (including a wheelchair)?: A Little Help needed standing up from a chair using your arms (e.g., wheelchair or bedside chair)?: A Little Help needed to walk in hospital room?: A Lot Help needed climbing 3-5 steps with a railing? : Total 6 Click Score: 17    End of Session Equipment Utilized During Treatment: Gait belt Activity Tolerance: Patient tolerated treatment well Patient left: in chair;with call bell/phone within reach   PT Visit Diagnosis: Muscle weakness (generalized) (M62.81)    Time: [id number] PT Time Calculation (min) (ACUTE ONLY): 48 min   Charges:   PT Evaluation $PT Eval Low Complexity: 1 Low PT Treatments $Therapeutic Activity: 23-37 mins           Doreatha Massed, PT Acute Rehabilitation  Office: [phone]

## 2022-10-19 NOTE — Progress Notes (Addendum)
Patient discharged to [facility] with home health. Pt is alert and oriented. IV was discontinued. AVS was given and explained. Pt was given time to ask questions. Pt questions were answered. Pt daughter at bedside to transport pt home. All pt belongings were packed and sent home with the pt. Pt escorted to main entrance via wheelchair by RN.

## 2022-10-19 NOTE — Discharge Summary (Signed)
Physician Discharge Summary   Patient: [name] MRN: [id number] DOB: 10-10-1940  Admit date:     10/17/2022  Discharge date: 10/19/22  Discharge Physician: Edwin Dada   PCP: Reynold Bowen, MD     Recommendations at discharge:  Follow up with PCP Dr. Forde Dandy in 1 week Dr. Forde Dandy:  Please check BP and resume antihypertensives as needed Check CBC in 1 week Check TSH in 1 month  Follow up with GI for Crohn's disease as planned     Discharge Diagnoses: Principal Problem:   COVID-19 virus infection Active Problems:   Nausea vomiting and diarrhea   Memory loss or impairment   Hypothyroidism   Hyponatremia   Hypokalemia   Inflammatory bowel disease (Crohn's disease) ([place])   Normocytic anemia     Hospital Course: [name] is an 82 y.o. F with recent Cdiff, relatively new diagnosis Crohn's disease who presented with COVID.  Admitted on Paxlovid, requiring IV fluids due to diarrhea, fatigue.      * COVID-19 virus infection Hypotension due to COVID, dehydration and vomiting No infiltrates on CXR, SpO2 normal.  Symptoms started 1 day PTA  Started on Paxlovid.  On day of discharge, afebrile, HR normal, RR and SpO2 normal, mentation good.  Discharged to complete 5 days Paxlovid.  Resume Crestor in 1 week        Nausea vomiting and diarrhea Due to COVID.  Low suspicion for reactivation of Cdiff at this time given normal WBC, no cramps, soft not liquid stool.  Amoxicillin stopped  Recent cellulitis Cardiology had started amoxicillin for suspected toe/foot cellulitis prior to admission.    This appeared resolved, and so antibiotic were stopped.    Normocytic anemia No clinical bleeding, Hgb trending down, recommend repeat CBC in 1 week  Inflammatory bowel disease (Crohn's disease) ([place]) Recent diagnosis, on prednisone taper and mesalamine.  Hypothyroidism TSH slightly high in January -Check TSH in 2 to 4 weeks            The [place] Registry was reviewed for this patient prior to discharge.   Consultants: None Procedures performed: None  Disposition: Home (ALF) with extra support   DISCHARGE MEDICATION: Allergies as of 10/19/2022       Reactions   Morphine And Related Itching, Rash   Given in IV.        Medication List     STOP taking these medications    amoxicillin-clavulanate 500-125 MG tablet Commonly known as: AUGMENTIN   furosemide 40 MG tablet Commonly known as: LASIX   lisinopril 2.5 MG tablet Commonly known as: ZESTRIL   metoprolol succinate 25 MG 24 hr tablet Commonly known as: Toprol XL   rosuvastatin 10 MG tablet Commonly known as: Crestor       TAKE these medications    acetaminophen 325 MG tablet Commonly known as: TYLENOL Take 650 mg by mouth 2 (two) times daily as needed for mild pain or headache.   ALPRAZolam 0.5 MG tablet Commonly known as: XANAX Take 1 tablet (0.5 mg total) by mouth 2 (two) times daily as needed. for anxiety What changed: when to take this   DULoxetine 60 MG capsule Commonly known as: CYMBALTA Take 60 mg by mouth daily.   EYE DROPS OP Place 2 drops into both eyes in the morning and at bedtime.   FIBER PO Take 1 tablet by mouth daily.   levothyroxine 88 MCG tablet Commonly known as: SYNTHROID Take 88 mcg by mouth every  morning.   mesalamine 1.2 g EC tablet Commonly known as: LIALDA Take 2 tablets (2.4 g total) by mouth daily with breakfast.   multivitamin with minerals tablet Take 1 tablet by mouth daily.   nirmatrelvir/ritonavir 20 x 150 MG & 10 x '100MG'$  Tabs Commonly known as: PAXLOVID Take 3 tablets by mouth 2 (two) times daily for 3 days.   omeprazole 40 MG capsule Commonly known as: PRILOSEC Take 1 capsule (40 mg total) by mouth daily. NEED ANNUAL VISIT FOR FURTHER REFILLS What changed: additional instructions   ondansetron 4 MG tablet Commonly known as: ZOFRAN Take 1 tablet (4 mg total) by mouth  every 6 (six) hours as needed for nausea.   predniSONE 10 MG tablet Commonly known as: DELTASONE Take 3 tablets (30 mg total) by mouth daily with breakfast for 5 days, THEN 2.5 tablets (25 mg total) daily with breakfast for 5 days, THEN 2 tablets (20 mg total) daily with breakfast for 5 days, THEN 1.5 tablets (15 mg total) daily with breakfast for 5 days, THEN 1 tablet (10 mg total) daily with breakfast for 5 days, THEN 0.5 tablets (5 mg total) daily with breakfast for 5 days, THEN 0.5 tablets (5 mg total) every other day for 3 days. Patient needs in Blister packs please. Start taking on: September 24, 2022   TUMS PO Take 2 tablets by mouth as needed (reflux.).   VITAMIN B-12 PO Take 1 capsule by mouth daily.        Follow-up Information     Reynold Bowen, MD. Schedule an appointment as soon as possible for a visit in 1 week(s).   Specialty: Endocrinology Contact information: [place] [address] [phone]                 Discharge Instructions     Discharge instructions   Complete by: As directed    **IMPORTANT DISCHARGE INSTRUCTIONS**   From Dr. Loleta Books: You were admitted for COVID  Here, we saw that your blood pressure was low and so we held your blood pressure medicines  When you leave: Take Paxlovid 3 tabs twice daily for 6 more doses starting tonight  Use ondansetron (nausea medicine) as needed for nausea  Use imodium (diarrhea medicine) as needed for diarrhea  Go see Dr. Forde Dandy in 1 week and have him check your labs Restart your Crestor (rosuvastatin, cholesterol medicine) in 1 week  For now, HOLD your blood pressure medicines (metoprolol, furosemide, and lisinopril) Ask Dr. Forde Dandy if you should restart your blood pressure medicines when you see him  You can stop the Nystatin  You SHOULD stop the amoxicillin-clavulanate (Augmentin)   Increase activity slowly   Complete by: As directed        Discharge Exam: Filed Weights    10/19/22 0736  Weight: 62.1 kg    General: Pt is alert, awake, not in acute distress, lying in bed Cardiovascular: RRR, nl S1-S2, no murmurs appreciated.   No LE edema.   Respiratory: Normal respiratory rate and rhythm.  CTAB without rales or wheezes. Abdominal: Abdomen soft and non-tender.  No distension or HSM.   Neuro/Psych: Judgment and insight appear normal, oriented and appropriate, generalized weakness but symmetric.   Condition at discharge: stable  The results of significant diagnostics from this hospitalization (including imaging, microbiology, ancillary and laboratory) are listed below for reference.   Imaging Studies: DG Chest 2 View  Result Date: 10/17/2022 CLINICAL DATA:  Suspected sepsis. EXAM: CHEST - 2 VIEW COMPARISON:  Chest radiographs 07/05/2022 and 04/30/2022 FINDINGS: Interval removal of the prior left internal jugular central venous catheter. Interval removal of the prior enteric tube. Cardiac silhouette and mediastinal contours are within limits. The lungs are clear. No pleural effusion or pneumothorax. Mild-to-moderate multilevel degenerative disc changes of the thoracic spine. Mild levocurvature centered at T11. Partially calcified breast implants are again noted. IMPRESSION: 1. No radiographic evidence of pneumonia. 2. Interval removal of the prior left internal jugular central venous catheter and enteric tube. Electronically Signed   By: Yvonne Kendall M.D.   On: 10/17/2022 12:24   VAS [place] LOWER EXTREMITY VENOUS (DVT)  Result Date: 10/04/2022  Lower Venous DVT Study Patient Name:  [name]  Date of Exam:   10/04/2022 Medical Rec #: [id number]        Accession #:    [id number] Date of Birth: 1940-09-24        Patient Gender: F Patient Age:   22 years Exam Location:  [facility] Procedure:      VAS [place] LOWER EXTREMITY VENOUS (DVT) Referring Phys: Collene Mares CUSTOVIC --------------------------------------------------------------------------------  Indications:  Swelling, and Edema.  Comparison Study: No priors. Performing Technologist: Oda Cogan RDMS, RVT  Examination Guidelines: A complete evaluation includes B-mode imaging, spectral Doppler, color Doppler, and power Doppler as needed of all accessible portions of each vessel. Bilateral testing is considered an integral part of a complete examination. Limited examinations for reoccurring indications may be performed as noted. The reflux portion of the exam is performed with the patient in reverse Trendelenburg.  +---------+---------------+---------+-----------+----------+--------------+ RIGHT    CompressibilityPhasicitySpontaneityPropertiesThrombus Aging +---------+---------------+---------+-----------+----------+--------------+ CFV      Full           Yes      Yes                                 +---------+---------------+---------+-----------+----------+--------------+ SFJ      Full                                                        +---------+---------------+---------+-----------+----------+--------------+ FV Prox  Full                                                        +---------+---------------+---------+-----------+----------+--------------+ FV Mid   Full                                                        +---------+---------------+---------+-----------+----------+--------------+ FV DistalFull                                                        +---------+---------------+---------+-----------+----------+--------------+ PFV      Full                                                        +---------+---------------+---------+-----------+----------+--------------+  POP      Full           Yes      Yes                                 +---------+---------------+---------+-----------+----------+--------------+ PTV      Full                                                         +---------+---------------+---------+-----------+----------+--------------+ PERO     Full                                                        +---------+---------------+---------+-----------+----------+--------------+   +----+---------------+---------+-----------+----------+--------------+ LEFTCompressibilityPhasicitySpontaneityPropertiesThrombus Aging +----+---------------+---------+-----------+----------+--------------+ CFV Full           Yes      Yes                                 +----+---------------+---------+-----------+----------+--------------+ SFJ Full                                                        +----+---------------+---------+-----------+----------+--------------+     Summary: RIGHT: - There is no evidence of deep vein thrombosis in the lower extremity.  - No cystic structure found in the popliteal fossa. - Small collection of fluid was noted medial right knee area.  LEFT: - No evidence of common femoral vein obstruction.  *See table(s) above for measurements and observations. Electronically signed by Orlie Pollen on 10/04/2022 at 6:55:35 PM.    Final     Microbiology: Results for orders placed or performed during the hospital encounter of 10/17/22  Culture, blood (Routine x 2)     Status: None (Preliminary result)   Collection Time: 10/17/22 11:28 AM   Specimen: BLOOD LEFT FOREARM  Result Value Ref Range Status   Specimen Description   Final    BLOOD LEFT FOREARM Performed at [facility] [address] [place], [place] [address]    Special Requests   Final    BOTTLES DRAWN AEROBIC AND ANAEROBIC Blood Culture results may not be optimal due to an excessive volume of blood received in culture bottles Performed at [facility] [address] [place], [place] [address]    Culture   Final    NO GROWTH 2 DAYS Performed at [facility] [address] [place], [place] [address]    Report Status  PENDING  Incomplete  Culture, blood (Routine x 2)     Status: None (Preliminary result)   Collection Time: 10/17/22 12:15 PM   Specimen: BLOOD RIGHT FOREARM  Result Value Ref Range Status   Specimen Description   Final    BLOOD RIGHT FOREARM Performed at [facility], [place] [address], [place], [place] [address]    Special Requests   Final  BOTTLES DRAWN AEROBIC AND ANAEROBIC Blood Culture adequate volume Performed at [facility] [address] [place], [place] [address]    Culture   Final    NO GROWTH 2 DAYS Performed at [facility] [address], [place], [place] [address]    Report Status PENDING  Incomplete  Resp panel by RT-PCR (RSV, Flu A&B, Covid) Anterior Nasal Swab     Status: Abnormal   Collection Time: 10/17/22 12:18 PM   Specimen: Anterior Nasal Swab  Result Value Ref Range Status   SARS Coronavirus 2 by RT PCR POSITIVE (A) NEGATIVE Final    Comment: (NOTE) SARS-CoV-2 target nucleic acids are DETECTED.  The SARS-CoV-2 RNA is generally detectable in upper respiratory specimens during the acute phase of infection. Positive results are indicative of the presence of the identified virus, but do not rule out bacterial infection or co-infection with other pathogens not detected by the test. Clinical correlation with patient history and other diagnostic information is necessary to determine patient infection status. The expected result is Negative.  Fact Sheet for Patients: EntrepreneurPulse.com.au  Fact Sheet for Healthcare Providers: IncredibleEmployment.be  This test is not yet approved or cleared by the [place] FDA and  has been authorized for detection and/or diagnosis of SARS-CoV-2 by FDA under an Emergency Use Authorization (EUA).  This EUA will remain in effect (meaning this test can be used) for the duration of  the COVID-19 declaration under Section 564(b)(1) of the A ct,  21 U.S.C. section 360bbb-3(b)(1), unless the authorization is terminated or revoked sooner.     Influenza A by PCR NEGATIVE NEGATIVE Final   Influenza B by PCR NEGATIVE NEGATIVE Final    Comment: (NOTE) The Xpert Xpress SARS-CoV-2/FLU/RSV plus assay is intended as an aid in the diagnosis of influenza from Nasopharyngeal swab specimens and should not be used as a sole basis for treatment. Nasal washings and aspirates are unacceptable for Xpert Xpress SARS-CoV-2/FLU/RSV testing.  Fact Sheet for Patients: EntrepreneurPulse.com.au  Fact Sheet for Healthcare Providers: IncredibleEmployment.be  This test is not yet approved or cleared by the [place] FDA and has been authorized for detection and/or diagnosis of SARS-CoV-2 by FDA under an Emergency Use Authorization (EUA). This EUA will remain in effect (meaning this test can be used) for the duration of the COVID-19 declaration under Section 564(b)(1) of the Act, 21 U.S.C. section 360bbb-3(b)(1), unless the authorization is terminated or revoked.     Resp Syncytial Virus by PCR NEGATIVE NEGATIVE Final    Comment: (NOTE) Fact Sheet for Patients: EntrepreneurPulse.com.au  Fact Sheet for Healthcare Providers: IncredibleEmployment.be  This test is not yet approved or cleared by the [place] FDA and has been authorized for detection and/or diagnosis of SARS-CoV-2 by FDA under an Emergency Use Authorization (EUA). This EUA will remain in effect (meaning this test can be used) for the duration of the COVID-19 declaration under Section 564(b)(1) of the Act, 21 U.S.C. section 360bbb-3(b)(1), unless the authorization is terminated or revoked.  Performed at [facility], [place] [address] [place], [place] [address]     Labs: CBC: Recent Labs  Lab 10/17/22 1132 10/18/22 0421 10/19/22 0413  WBC 9.2 7.0 5.3  NEUTROABS 5.7  --   --    HGB 9.1* 8.2* 7.5*  HCT 29.3* 26.7* 23.7*  MCV 99.7 101.9* 99.2  PLT 298 264 [id number]   Basic Metabolic Panel: Recent Labs  Lab 10/14/22 1238 10/17/22 1132 10/18/22 0421  NA 138 132* 133*  K 4.2 3.3* 3.7  CL 101 98 103  CO2 24 23 21*  GLUCOSE 131* 131* 94  BUN '13 19 14  '$ CREATININE 0.60 0.76 0.67  CALCIUM 8.9 8.2* 7.6*  MG  --   --  1.9   Liver Function Tests: Recent Labs  Lab 10/17/22 1132  AST 23  ALT 19  ALKPHOS 41  BILITOT 0.4  PROT 5.6*  ALBUMIN 2.9*   CBG: No results for input(s): "GLUCAP" in the last 168 hours.  Discharge time spent: approximately 35 minutes spent on discharge counseling, evaluation of patient on day of discharge, and coordination of discharge planning with nursing, social work, pharmacy and case management  Signed: Edwin Dada, MD [facility] 10/19/2022

## 2022-10-19 NOTE — Plan of Care (Signed)
Problem: Education: Goal: Knowledge of risk factors and measures for prevention of condition will improve 10/19/2022 1508 by Linus Salmons, RN Outcome: Adequate for Discharge 10/19/2022 0812 by Linus Salmons, RN Outcome: Progressing   Problem: Coping: Goal: Psychosocial and spiritual needs will be supported 10/19/2022 1508 by Linus Salmons, RN Outcome: Adequate for Discharge 10/19/2022 0812 by Linus Salmons, RN Outcome: Progressing   Problem: Respiratory: Goal: Will maintain a patent airway 10/19/2022 1508 by Linus Salmons, RN Outcome: Adequate for Discharge 10/19/2022 [id number] by Linus Salmons, RN Outcome: Progressing Goal: Complications related to the disease process, condition or treatment will be avoided or minimized 10/19/2022 1508 by Linus Salmons, RN Outcome: Adequate for Discharge 10/19/2022 0812 by Linus Salmons, RN Outcome: Progressing   Problem: Education: Goal: Knowledge of General Education information will improve Description: Including pain rating scale, medication(s)/side effects and non-pharmacologic comfort measures 10/19/2022 1508 by Linus Salmons, RN Outcome: Adequate for Discharge 10/19/2022 0812 by Linus Salmons, RN Outcome: Progressing   Problem: Health Behavior/Discharge Planning: Goal: Ability to manage health-related needs will improve 10/19/2022 1508 by Linus Salmons, RN Outcome: Adequate for Discharge 10/19/2022 [id number] by Linus Salmons, RN Outcome: Progressing   Problem: Clinical Measurements: Goal: Ability to maintain clinical measurements within normal limits will improve 10/19/2022 1508 by Linus Salmons, RN Outcome: Adequate for Discharge 10/19/2022 [id number] by Linus Salmons, RN Outcome: Progressing Goal: Will remain free from infection 10/19/2022 1508 by Linus Salmons, RN Outcome: Adequate for Discharge 10/19/2022 [id number] by Linus Salmons, RN Outcome: Progressing Goal: Diagnostic test results will improve 10/19/2022 1508 by Linus Salmons, RN Outcome: Adequate  for Discharge 10/19/2022 [id number] by Linus Salmons, RN Outcome: Progressing Goal: Respiratory complications will improve 10/19/2022 1508 by Linus Salmons, RN Outcome: Adequate for Discharge 10/19/2022 [id number] by Linus Salmons, RN Outcome: Progressing Goal: Cardiovascular complication will be avoided 10/19/2022 1508 by Linus Salmons, RN Outcome: Adequate for Discharge 10/19/2022 0812 by Linus Salmons, RN Outcome: Progressing   Problem: Activity: Goal: Risk for activity intolerance will decrease 10/19/2022 1508 by Linus Salmons, RN Outcome: Adequate for Discharge 10/19/2022 [phone] by Linus Salmons, RN Outcome: Progressing   Problem: Nutrition: Goal: Adequate nutrition will be maintained 10/19/2022 1508 by Linus Salmons, RN Outcome: Adequate for Discharge 10/19/2022 0812 by Linus Salmons, RN Outcome: Progressing   Problem: Coping: Goal: Level of anxiety will decrease 10/19/2022 1508 by Linus Salmons, RN Outcome: Adequate for Discharge 10/19/2022 0812 by Linus Salmons, RN Outcome: Progressing   Problem: Elimination: Goal: Will not experience complications related to bowel motility 10/19/2022 1508 by Linus Salmons, RN Outcome: Adequate for Discharge 10/19/2022 [id number] by Linus Salmons, RN Outcome: Progressing Goal: Will not experience complications related to urinary retention 10/19/2022 1508 by Linus Salmons, RN Outcome: Adequate for Discharge 10/19/2022 0812 by Linus Salmons, RN Outcome: Progressing   Problem: Pain Managment: Goal: General experience of comfort will improve 10/19/2022 1508 by Linus Salmons, RN Outcome: Adequate for Discharge 10/19/2022 [id number] by Linus Salmons, RN Outcome: Progressing   Problem: Safety: Goal: Ability to remain free from injury will improve 10/19/2022 1508 by Linus Salmons, RN Outcome: Adequate for Discharge 10/19/2022 [id number] by Linus Salmons, RN Outcome: Progressing   Problem: Skin Integrity: Goal: Risk for impaired skin integrity will decrease 10/19/2022 1508  by Linus Salmons, RN Outcome: Adequate for Discharge 10/19/2022 0812 by Linus Salmons, RN Outcome:  Progressing

## 2022-10-19 NOTE — Plan of Care (Signed)

## 2022-10-19 NOTE — TOC Initial Note (Signed)
Transition of Care [facility]) - Initial/Assessment Note    Patient Details  Name: [name] MRN: [id number] Date of Birth: 04-17-1941  Transition of [facility] CM/SW Contact:    Henrietta Dine, RN Phone Number: 10/19/2022, 4:34 PM  Clinical Narrative:                 Damaris Schooner w/ pt; she says she is from [place] and she plans to return at d/c; she also says she has her own PT set up; she has transportation; no TOC needs.        Patient Goals and CMS Choice            Expected Discharge Plan and Services         Expected Discharge Date: 10/19/22                                    Prior Living Arrangements/Services                       Activities of Daily Living Home Assistive Devices/Equipment: None ADL Screening (condition at time of admission) Patient's cognitive ability adequate to safely complete daily activities?: Yes Is the patient deaf or have difficulty hearing?: No Does the patient have difficulty seeing, even when wearing glasses/contacts?: No Does the patient have difficulty concentrating, remembering, or making decisions?: No Patient able to express need for assistance with ADLs?: Yes Does the patient have difficulty dressing or bathing?: Yes Independently performs ADLs?: No Communication: Independent Dressing (OT): Needs assistance Is this a change from baseline?: Pre-admission baseline Grooming: Independent Feeding: Independent Bathing: Needs assistance Is this a change from baseline?: Pre-admission baseline Toileting: Needs assistance Is this a change from baseline?: Pre-admission baseline In/Out Bed: Dependent Is this a change from baseline?: Pre-admission baseline Walks in Home: Dependent Is this a change from baseline?: Pre-admission baseline Does the patient have difficulty walking or climbing stairs?: Yes Weakness of Legs: Both Weakness of Arms/Hands: None  Permission Sought/Granted                   Emotional Assessment              Admission diagnosis:  Hypotension, unspecified hypotension type [I95.9] COVID-19 virus infection [U07.1] COVID-19 [U07.1] Patient Active Problem List   Diagnosis Date Noted   Hypokalemia 10/17/2022   Inflammatory bowel disease (Crohn's disease) ([place]) 10/17/2022   COVID-19 virus infection 10/17/2022   Normocytic anemia 10/17/2022   Nausea vomiting and diarrhea 10/17/2022   Chronic diarrhea 08/30/2022   Noninfectious gastroenteritis 08/30/2022   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   Hypoalbuminemia    Diarrhea 08/28/2022   Abnormal gait 07/23/2022   Severe protein-calorie malnutrition ([place]) 07/10/2022   Hypovolemia 07/05/2022   Atrioventricular block, Mobitz type 1, 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   Dizziness 06/27/2022   Prolonged Q-T interval on ECG 06/27/2022   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   TIA (transient ischemic attack) 04/30/2022   Acute respiratory failure with hypoxia ([place]) 05/11/2020   Right upper lobe pneumonia 05/11/2020   Hyponatremia 05/11/2020   GERD (gastroesophageal reflux disease)    Anxiety with depression    Anxiety 11/14/2016   Swelling 06/18/2016   Hypothyroidism 05/17/2016   Abnormal CT scan 12/28/2015   Lumbar radiculopathy 06/13/2015   Chronic low back pain 04/13/2015   Cervical disc disorder with radiculopathy of cervical region 06/20/2014   Bursitis of right shoulder 05/04/2014  Insomnia 04/21/2013   Routine health maintenance 01/28/2013   Essential hypertension 01/09/2012   Memory loss or impairment 12/02/2010   Fibromyalgia 05/04/2007   PCP:  Reynold Bowen, MD Pharmacy:   [facility] [place] [address] Phone: [phone] Fax: [facility] [address] [place] [address] Phone: [phone] Fax: [place]  [place] [address] Phone: [phone] Fax: [phone]     Social Determinants of Health (SDOH) Social History: SDOH Screenings   Food Insecurity: No Food Insecurity (10/17/2022)  Housing: Low Risk  (10/17/2022)  Transportation Needs: No Transportation Needs (10/17/2022)  Utilities: Not At Risk (10/17/2022)  Tobacco Use: Low Risk  (10/18/2022)   SDOH Interventions:     Readmission Risk Interventions     No data to display

## 2022-10-20 ENCOUNTER — Emergency Department (HOSPITAL_COMMUNITY)
Admission: EM | Admit: 2022-10-20 | Discharge: 2022-10-20 | Disposition: A | Discharge status: Home / Self Care | Payer: Medicare Other | Attending: Emergency Medicine | Admitting: Emergency Medicine

## 2022-10-20 ENCOUNTER — Other Ambulatory Visit: Payer: Self-pay | Admitting: Nurse Practitioner

## 2022-10-20 ENCOUNTER — Encounter (HOSPITAL_COMMUNITY): Payer: Self-pay

## 2022-10-20 ENCOUNTER — Emergency Department (HOSPITAL_COMMUNITY): Payer: Medicare Other

## 2022-10-20 ENCOUNTER — Other Ambulatory Visit: Payer: Self-pay

## 2022-10-20 ENCOUNTER — Telehealth: Payer: Self-pay | Admitting: Nurse Practitioner

## 2022-10-20 DIAGNOSIS — K50111 Crohn's disease of large intestine with rectal bleeding: Secondary | ICD-10-CM | POA: Insufficient documentation

## 2022-10-20 DIAGNOSIS — K625 Hemorrhage of anus and rectum: Secondary | ICD-10-CM | POA: Diagnosis present

## 2022-10-20 LAB — CBC
HCT: 32.4 % — ABNORMAL LOW (ref 36.0–46.0)
Hemoglobin: 9.9 g/dL — ABNORMAL LOW (ref 12.0–15.0)
MCH: 30.6 pg (ref 26.0–34.0)
MCHC: 30.6 g/dL (ref 30.0–36.0)
MCV: 100 fL (ref 80.0–100.0)
Platelets: 309 10*3/uL (ref 150–400)
RBC: 3.24 MIL/uL — ABNORMAL LOW (ref 3.87–5.11)
RDW: 15.3 % (ref 11.5–15.5)
WBC: 4.9 10*3/uL (ref 4.0–10.5)
nRBC: 0 % (ref 0.0–0.2)

## 2022-10-20 LAB — COMPREHENSIVE METABOLIC PANEL
ALT: 20 U/L (ref 0–44)
AST: 26 U/L (ref 15–41)
Albumin: 2.5 g/dL — ABNORMAL LOW (ref 3.5–5.0)
Alkaline Phosphatase: 62 U/L (ref 38–126)
Anion gap: 12 (ref 5–15)
BUN: 10 mg/dL (ref 8–23)
CO2: 20 mmol/L — ABNORMAL LOW (ref 22–32)
Calcium: 8.6 mg/dL — ABNORMAL LOW (ref 8.9–10.3)
Chloride: 102 mmol/L (ref 98–111)
Creatinine, Ser: 0.58 mg/dL (ref 0.44–1.00)
GFR, Estimated: >60 mL/min (ref >60)
Glucose, Bld: 144 mg/dL — ABNORMAL HIGH (ref 70–99)
Potassium: 4.2 mmol/L (ref 3.5–5.1)
Sodium: 134 mmol/L — ABNORMAL LOW (ref 135–145)
Total Bilirubin: 0.2 mg/dL — ABNORMAL LOW (ref 0.3–1.2)
Total Protein: 5.5 g/dL — ABNORMAL LOW (ref 6.5–8.1)

## 2022-10-20 LAB — POC OCCULT BLOOD, ED: Fecal Occult Bld: POSITIVE — AB

## 2022-10-20 MED ORDER — PREDNISONE 20 MG PO TABS
40.0000 mg | ORAL_TABLET | Freq: Once | ORAL | Status: AC
  Administered 2022-10-20: 40 mg via ORAL
  Filled 2022-10-20: qty 2

## 2022-10-20 MED ORDER — IOHEXOL 350 MG/ML SOLN
75.0000 mL | Freq: Once | INTRAVENOUS | Status: AC | PRN
  Administered 2022-10-20: 75 mL via INTRAVENOUS

## 2022-10-20 MED ORDER — PREDNISONE 20 MG PO TABS: 40.0000 mg | ORAL_TABLET | Freq: Every day | ORAL | 0 refills | Status: DC

## 2022-10-20 NOTE — ED Provider Notes (Signed)
[place] EMERGENCY DEPARTMENT AT [facility] Provider Note   CSN: [id number] Arrival date & time: 10/20/22  1726     History  Chief Complaint  Patient presents with   Rectal Bleeding    [name] is a 82 y.o. female with a past medical history of Crohn's and ulcerative colitis along with recent COVID-19 infection who presents emergency department for bright red blood per rectum.  Patient states she has never had this in the past.  She has not had any Crohn's flares for several months and is on preventive medications.  Patient states that she is not really having any abdominal pain but went to use the bathroom today and noticed there was blood in her toilet and there was also blood in her adult diaper today.  She has  Rectal Bleeding      Home Medications Prior to Admission medications   Medication Sig Start Date End Date Taking? Authorizing Provider  acetaminophen (TYLENOL) 325 MG tablet Take 650 mg by mouth 2 (two) times daily as needed for mild pain or headache.    [provider]  ALPRAZolam [name]) 0.5 MG tablet Take 1 tablet (0.5 mg total) by mouth 2 (two) times daily as needed. for anxiety Patient taking differently: Take 0.5 mg by mouth at bedtime. for anxiety 07/16/22   Marinda Elk, MD  Calcium Carbonate Antacid (TUMS PO) Take 2 tablets by mouth as needed (reflux.).    [provider]  Carboxymethylcellulose Sodium (EYE DROPS OP) Place 2 drops into both eyes in the morning and at bedtime.    [provider]  Cyanocobalamin (VITAMIN B-12 PO) Take 1 capsule by mouth daily.    [provider]  DULoxetine (CYMBALTA) 60 MG capsule Take 60 mg by mouth daily.    [provider]  FIBER PO Take 1 tablet by mouth daily.    [provider]  levothyroxine (SYNTHROID) 88 MCG tablet Take 88 mcg by mouth every morning. 09/13/22   [provider]  mesalamine (LIALDA) 1.2 g EC tablet Take 2 tablets (2.4 g  total) by mouth daily with breakfast. 10/01/22   Armbruster, Willaim Rayas, MD  Multiple Vitamins-Minerals (MULTIVITAMIN WITH MINERALS) tablet Take 1 tablet by mouth daily.    [provider]  nirmatrelvir/ritonavir (PAXLOVID) 20 x 150 MG & 10 x 100MG  TABS Take 3 tablets by mouth 2 (two) times daily for 3 days. 10/19/22 10/24/22  Danford, Earl Lites, MD  omeprazole (PRILOSEC) 40 MG capsule Take 1 capsule (40 mg total) by mouth daily. NEED ANNUAL VISIT FOR FURTHER REFILLS Patient taking differently: Take 40 mg by mouth daily. 04/29/17   Myrlene Broker, MD  ondansetron (ZOFRAN) 4 MG tablet Take 1 tablet (4 mg total) by mouth every 6 (six) hours as needed for nausea. 10/19/22   Danford, Earl Lites, MD  predniSONE (DELTASONE) 10 MG tablet Take 3 tablets (30 mg total) by mouth daily with breakfast for 5 days, THEN 2.5 tablets (25 mg total) daily with breakfast for 5 days, THEN 2 tablets (20 mg total) daily with breakfast for 5 days, THEN 1.5 tablets (15 mg total) daily with breakfast for 5 days, THEN 1 tablet (10 mg total) daily with breakfast for 5 days, THEN 0.5 tablets (5 mg total) daily with breakfast for 5 days, THEN 0.5 tablets (5 mg total) every other day for 3 days. Patient needs in Blister packs please. 09/24/22 10/27/22  Meredith Pel, NP      Allergies  Morphine and related    Review of Systems   Review of Systems  Gastrointestinal:  Positive for hematochezia.    Physical Exam Updated Vital Signs BP 131/67 (BP Location: Left Arm)   Pulse 82   Temp 98.5 F (36.9 C) (Oral)   Resp 16   Ht 5\' 2"  (1.575 m)   Wt 62.1 kg   SpO2 100%   BMI 25.04 kg/m  Physical Exam Vitals and nursing note reviewed.  Constitutional:      General: She is not in acute distress.    Appearance: She is well-developed. She is not diaphoretic.  HENT:     Head: Normocephalic and atraumatic.     Right Ear: External ear normal.     Left Ear: External ear normal.     Nose: Nose normal.      Mouth/Throat:     Mouth: Mucous membranes are moist.  Eyes:     General: No scleral icterus.    Conjunctiva/sclera: Conjunctivae normal.  Cardiovascular:     Rate and Rhythm: Normal rate and regular rhythm.     Heart sounds: Normal heart sounds. No murmur heard.    No friction rub. No gallop.  Pulmonary:     Effort: Pulmonary effort is normal. No respiratory distress.     Breath sounds: Normal breath sounds.  Abdominal:     General: Bowel sounds are normal. There is no distension.     Palpations: Abdomen is soft. There is no mass.     Tenderness: There is no abdominal tenderness. There is no guarding.  Genitourinary:    Comments: Digital Rectal Exam reveals sphincter with good tone. No thrombosed external hemorrhoids. No masses or fissures. Stool color is brown with no overt blood.  Musculoskeletal:     Cervical back: Normal range of motion.  Skin:    General: Skin is warm and dry.  Neurological:     Mental Status: She is alert and oriented to person, place, and time.  Psychiatric:        Behavior: Behavior normal.     ED Results / Procedures / Treatments   Labs (all labs ordered are listed, but only abnormal results are displayed) Labs Reviewed  CBC - Abnormal; Notable for the following components:      Result Value   RBC 3.24 (*)    Hemoglobin 9.9 (*)    HCT 32.4 (*)    All other components within normal limits  COMPREHENSIVE METABOLIC PANEL    EKG None  Radiology No results found.  Procedures Procedures    Medications Ordered in ED Medications - No data to display  ED Course/ Medical Decision Making/ A&P Clinical Course as of 10/20/22 2035  Wynelle Link Oct 20, 2022  2027 CT ABDOMEN PELVIS W CONTRAST [AH]  2027 Hemoglobin(!): 9.9 Hemoglobin appears to be at baseline [AH]  2027 Fecal Occult Blood, POC(!): POSITIVE Occult stool positive [AH]  2027 Comprehensive metabolic panel(!) [AH]    Clinical Course User Index [AH] Arthor Captain, PA-C                              Medical Decision Making This patient presents to the ED for concern of brpr, this involves an extensive number of treatment options, and is a complaint that carries with it a high risk of complications and morbidity.  Differential diagnosis includes bleeding internal hemorrhoids, AVM, cancer, colitis, proctitis, ischemia, infection.    Co morbidities that complicate the  patient evaluation       History of Crohn's and ulcerative colitis   Additional history obtained:  {Additional history obtained from patient's attendant at bedside, review of EMR    Lab Tests:  I Ordered, and personally interpreted labs.  The pertinent results include:   Positive occult stool, CBC without elevated white blood cell count, hemoglobin at baseline, CMP is unremarkable   Imaging Studies ordered:  I ordered imaging studies including CT abdomen and pelvis with contrast I independently visualized and interpreted imaging which showed acute inflammation of the transverse and descending colon consistent with Crohn's I agree with the radiologist interpretation   Cardiac Monitoring/ECG:       The patient was maintained on a cardiac monitor.  I personally viewed and interpreted the cardiac monitored which showed an underlying rhythm of:   N/a      Medicines ordered and prescription drug management:  I ordered medication including Medications  predniSONE (DELTASONE) tablet 40 mg (40 mg Oral Given 10/20/22 2154) for acute Crohn's flare  I have reviewed the patients home medicines and have made adjustments as needed   Test Considered:       CT angiogram for GI bleed however low suspicion for active/significant hemorrhage   Critical Interventions:       N/A   Consultations Obtained:  I requested consultation with the GI specialist Dr. Rhea Belton,  and discussed lab and imaging findings as well as pertinent plan -he recommends: Prednisone 40 mg daily and close outpatient  follow-up with her GI specialist    Problem List / ED Course:       (K50.111) Crohn's disease of colon with rectal bleeding (HCC)  (primary encounter diagnosis)     Reevaluation:  After the interventions noted above, I reevaluated the patient and found that they have :improved   Social Determinants of Health:       Good social support   Dispostion:  After consideration of the diagnostic results and the patients response to treatment, I feel that the patent would benefit from discharge, medication management for acute Crohn's flare and close GI follow-up.    Amount and/or Complexity of Data Reviewed Labs: ordered. Decision-making details documented in ED Course. Radiology: ordered. Decision-making details documented in ED Course.  Risk Prescription drug management.           Final Clinical Impression(s) / ED Diagnoses Final diagnoses:  None    Rx / DC Orders ED Discharge Orders     None         Arthor Captain, PA-C 10/21/22 1301    Glyn Ade, MD 10/23/22 [phone]

## 2022-10-20 NOTE — Progress Notes (Signed)
Answering Service:   Raneisha was hospitalized in February and diagnosed with probable Crohn's. She is on mesalamine and a prednisone taper. She is not having any abdominal pain, N/V or diarrhea but today has started having rectal bleeding.  Bleeding started with a small amount of blood earlier today. Then she soaked a pad with blood from rectum. She sees something protruding from rectum, hemorrhoid maybe?   Addilee was recently readmitted. She has COVID and is on Paxlovid  We discussed options for the bleeding. She could try prep H inside rectum and then come go to ED if bleeding persisted vr. just come to ED now. She prefers to come on to ED and be checked out.  Hopefully hgb will be stable and this is just hemorrhoidal bleeding.   She has a follow up in the office with Dr. Havery Moros.

## 2022-10-20 NOTE — Discharge Instructions (Addendum)
Contact a health care provider if: You have diarrhea, cramps in your abdomen, and other GI problems that are present almost all the time. Your symptoms do not improve with treatment, your symptoms get worse, or you develop new symptoms. You cannot pass stools. You continue to lose weight. You develop a rash or sores on your skin. You develop eye problems. You have a fever. Get help right away if: You have bloody diarrhea. You have severe pain in your abdomen. These symptoms may be an emergency. Get help right away. Call 911. Do not wait to see if the symptoms will go away. Do not drive yourself to the hospital.

## 2022-10-20 NOTE — ED Notes (Signed)
Patient given sandwich to eat while waiting on transport.

## 2022-10-20 NOTE — Telephone Encounter (Signed)
Answering Service Call:    [name] was hospitalized in February and diagnosed with probable Crohn's. She is on mesalamine and a prednisone taper. She is not having any abdominal pain, N/V or diarrhea but today has started having rectal bleeding.  Bleeding started with a small amount of blood earlier today. Then she soaked a pad with blood from rectum. She sees something protruding from rectum, hemorrhoid maybe?    [name] was recently readmitted. She has COVID and is on Paxlovid   We discussed options for the bleeding. She could try prep H inside rectum and then come go to ED if bleeding persisted vr. just come to ED now. She prefers to come on to ED and be checked out.  Hopefully hgb will be stable and this is just hemorrhoidal bleeding.    She has a follow up in the office with Dr. Havery Moros.

## 2022-10-20 NOTE — ED Triage Notes (Signed)
Pt to ED from assisted living facility with c/o rectal bleeding. Pt states she started noticing bright red rectal bleeding since this morning. Pt reports hx of chron's, and ulcerative colitis. Pt is Aox4 in triage, denies any abd pain, or nausea.

## 2022-10-21 ENCOUNTER — Telehealth: Payer: Self-pay | Admitting: Gastroenterology

## 2022-10-21 ENCOUNTER — Other Ambulatory Visit (HOSPITAL_COMMUNITY): Payer: Self-pay

## 2022-10-21 DIAGNOSIS — R197 Diarrhea, unspecified: Secondary | ICD-10-CM

## 2022-10-21 DIAGNOSIS — K50111 Crohn's disease of large intestine with rectal bleeding: Secondary | ICD-10-CM

## 2022-10-21 NOTE — Telephone Encounter (Signed)
Patient called requesting to speak with a nurse she was in the ED yesterday due to rectal bleeding  she is schedule for a follow up 4/10.Please advise

## 2022-10-22 LAB — CULTURE, BLOOD (ROUTINE X 2)
Culture: NO GROWTH
Culture: NO GROWTH
Special Requests: ADEQUATE

## 2022-10-22 NOTE — Telephone Encounter (Signed)
Left message on machine to call back  

## 2022-10-22 NOTE — Telephone Encounter (Signed)
I was contacted by emergency room physician assistant on Sunday night regarding this patient  She presented with rectal bleeding and diarrhea They did not feel that she needed admission and that her symptoms were consistent with a flare of her IBD She had not been adherent to previously prescribed prednisone therapy  I recommended that they begin prednisone 40 mg once daily for 7 days and will defer to Dr. Havery Moros regarding further treatment and taper from there  I did not see or examine the patient on that evening.  She was discharged by the emergency room staff

## 2022-10-22 NOTE — Telephone Encounter (Signed)
Thanks Ulice Dash.  Brooklyn can you check on her to see how she is doing?  I think she is scheduled to see me in about 1 month for an office visit.  If she is doing poorly let me know, we will try to get her in sooner.  I will otherwise, would continue prednisone for 1 week at that dose, taper by 5 mg/week until off.  Thanks

## 2022-10-23 ENCOUNTER — Other Ambulatory Visit: Payer: Medicare Other

## 2022-10-23 DIAGNOSIS — R197 Diarrhea, unspecified: Secondary | ICD-10-CM

## 2022-10-23 MED ORDER — MESALAMINE 1.2 G PO TBEC: 2.4000 g | DELAYED_RELEASE_TABLET | Freq: Every day | ORAL | 3 refills | Status: DC

## 2022-10-23 NOTE — Telephone Encounter (Signed)
I called the patient and we had a lengthy discussion about her current issues.  I reviewed her recent hospitalization for COVID, she has recovered from that and discharged home.  Was otherwise on antibiotic for separate infection in recent weeks.  Around the time she went to the hospital for [place] she developed worsening diarrhea with rectal bleeding.  She had a CT scan done there which showed active colitis.  She has not been tested for C. difficile and I think she needs this to make sure that is not a cause for her colitis to flare.  In the ED she was empirically placed on prednisone and taking 40 mg daily.  Since being on prednisone her diarrhea has improved but not resolved, her rectal bleeding has resolved.  I am recommending that she go to the lab for C. difficile PCR.  She states she currently cannot leave her house or have someone assist her to get there.  Her family or friend will come to our lab to pick up the stool test for C. difficile and return to her so she can submit and then they will bring it back on her behalf.  She will continue prednisone 40 mg daily for now until we get the C. difficile back.  She ran out of her Lialda in recent days, I recommend we refill that and she resume that, 2 tabs per day.  She is not having fevers otherwise and overall again rectal bleeding has stopped and diarrhea appears to be improving, she appears to be responding to therapy. She does state after her initial hospitalization with me, her diarrhea significantly improved and resolved on oral prednisone taper.  If her C. difficile is positive we will treat it  If her C. difficile is negative, she likely has colitis flare despite Lialda and we will need to discuss escalation of therapy.  She will likely be on a prolonged prednisone taper until we can transition to something else.  In her case I would likely prefer Entyvio given safety profile.  Otherwise, she has multiple other complaints about lower extremity  swelling, she had her diuretic stopped recently in the ED.  She is worried about her antihypertensives that were otherwise held.  I recommend she call her cardiologist who has been managing this issues so they can discuss regimen for that.  She is agreeable with the plan, I answered all of her questions.  I will contact her with results of C. difficile test with further recommendations, she will continue prednisone until then at current dosing and resume Lialda as well.     Patty just an FYI - I called her back. I ordered the C diff test and I refilled her Lialda.

## 2022-10-23 NOTE — Telephone Encounter (Signed)
Dr Havery Moros I spoke to the pt and she is very upset and anxious over the phone.  She is tearful and angry at times.  I was not able to ask any questions she told me to be quiet and let her speak and she advised me to take notes.  She said that you need to call her urgently to discuss her concerns of swelling, eye lid drooping, heart rate and rectal bleeding.  She would also like you to review her CT and labs from the ED and is upset that you have not called her to discuss.  She states that she is our patient and you should make time for her.  She continued to cry and not let me ask any questions and she states she expects a call from you urgently.

## 2022-10-23 NOTE — Telephone Encounter (Signed)
Spoke with patient advised her of Dr. Wende Crease recommendation of restarting her bp medications. Patient verbalized understanding.

## 2022-10-23 NOTE — Telephone Encounter (Signed)
Patient called back is requesting to speak with a nurse urgently.

## 2022-10-23 NOTE — Telephone Encounter (Signed)
Please call patient regarding recent hospital stay and meds that were d/c , she wants to talk to you

## 2022-10-23 NOTE — Addendum Note (Signed)
Addended by: Yetta Flock on: 10/23/2022 11:56 AM   Modules accepted: Orders

## 2022-10-23 NOTE — Telephone Encounter (Signed)
See above

## 2022-10-23 NOTE — Telephone Encounter (Signed)
Please resend her BP meds and tell her to start taking them again. They stopped them in hospital due to low blood pressure. I can't call her until later my schedule is packed

## 2022-10-25 ENCOUNTER — Other Ambulatory Visit (HOSPITAL_COMMUNITY): Payer: Self-pay

## 2022-10-25 ENCOUNTER — Other Ambulatory Visit: Payer: Self-pay

## 2022-10-25 LAB — CLOSTRIDIUM DIFFICILE BY PCR: Toxigenic C. Difficile by PCR: POSITIVE — AB

## 2022-10-25 MED ORDER — PREDNISONE 5 MG PO TABS: ORAL_TABLET | ORAL | 0 refills | Status: AC

## 2022-10-25 MED ORDER — VANCOMYCIN HCL 125 MG PO CAPS: 125.0000 mg | ORAL_CAPSULE | Freq: Four times a day (QID) | ORAL | 0 refills | Status: DC

## 2022-10-26 ENCOUNTER — Other Ambulatory Visit: Payer: Self-pay | Admitting: Internal Medicine

## 2022-10-26 DIAGNOSIS — M7989 Other specified soft tissue disorders: Secondary | ICD-10-CM

## 2022-10-29 ENCOUNTER — Telehealth: Payer: Self-pay | Admitting: Nurse Practitioner

## 2022-10-29 MED ORDER — VANCOMYCIN HCL 125 MG PO CAPS: 125.0000 mg | ORAL_CAPSULE | Freq: Four times a day (QID) | ORAL | 0 refills | Status: AC

## 2022-10-29 NOTE — Telephone Encounter (Signed)
Sorry to hear this. Hopefully if she only took that dosing for 2 days there are no problems, her kidneys are normal which is good. I would give her enough to do complete 10 days of therapy total from when she first started it - if you can give her 1 tab to take four times daily. Hopefully not contagious after she completes therapy. Thanks. If she feels poorly in the interim she should contact us

## 2022-10-29 NOTE — Telephone Encounter (Signed)
PT is wanting to know if she can add some electrolytes to her regimen. Please advise.

## 2022-10-29 NOTE — Telephone Encounter (Signed)
Patient called states she just noticed she was suppose to take the vancomycin medication 4 times a day and she was taking 4 pills 4 times a day. Said she has not had any reaction as of yet but would need a refill.

## 2022-10-29 NOTE — Telephone Encounter (Signed)
I spoke with [name] and she started the vancomycin March 15th. The first 2 days she took 4 capsules QID then she realized that was not the way to do it. She will be out today. Please advise regarding a refill on the vancomycin. Thank you. She said she didn't have any adverse reaction to taking too many pills. I confirmed [name] is where she wants it sent.  She wants to know how long is she contagious Sir.

## 2022-10-29 NOTE — Telephone Encounter (Signed)
I called and informed [name] of the plan and sent in the refill for her to [facility].

## 2022-10-30 NOTE — Telephone Encounter (Signed)
Called the patient. No answer. Left her a voicemail to add Pedialyte to her intake or her preferred sports drink. Asked she call us back if she is not seeing any improvement.

## 2022-11-04 ENCOUNTER — Telehealth: Payer: Self-pay

## 2022-11-04 NOTE — Telephone Encounter (Signed)
Blood pressure readings 96/66 hr 89 73/53/ hr 86 101/61 hr 93 Very tired and sleepy  She is taking metoprolol 12.5 mg morning  & lisinopril 2.5mg  at night and 12.5 mg at night of metoprolol

## 2022-11-05 NOTE — Telephone Encounter (Signed)
Ok have her stop lisinopril and I need her scheduled this week please

## 2022-11-05 NOTE — Telephone Encounter (Signed)
Thursday 9:45 advised her to stop lisinopril

## 2022-11-07 ENCOUNTER — Ambulatory Visit: Payer: Self-pay | Admitting: Internal Medicine

## 2022-11-07 ENCOUNTER — Telehealth: Payer: Self-pay

## 2022-11-07 NOTE — Telephone Encounter (Signed)
Patient home blood pressure is soft at baseline. Lisinopril was most recently discontinued.   Systolic Blood Pressure mmHg -- 109.5 (73.0 - [id number])  Diastolic Blood Pressure mmHg -- 67.1 (40.0 - 87.0)  Heart Rate bpm -- 88.9 (72.0 - 119.0)  11/07/22 8:37 AM 102 / 62 mmHg 86 bpm   11/07/22 8:36 AM 101 / 63 mmHg 87 bpm  11/07/22 7:50 AM 94 / 55 mmHg 92 bpm  11/07/22 7:49 AM 97 / 58 mmHg 94 bpm  11/07/22 12:06 AM 131 / 75 mmHg 119 bpm  11/06/22 9:08 PM 115 / 64 mmHg 95 bpm  11/06/22 2:25 PM 112 / 65 mmHg 81 bpm  11/06/22 2:05 PM 92 / 56 mmHg 78 bpm  11/06/22 9:28 AM 107 / 76 mmHg 86 bpm

## 2022-11-07 NOTE — Progress Notes (Signed)
Rescheduled

## 2022-11-10 ENCOUNTER — Emergency Department (HOSPITAL_COMMUNITY): Payer: Medicare Other

## 2022-11-10 ENCOUNTER — Encounter (HOSPITAL_COMMUNITY): Payer: Self-pay

## 2022-11-10 ENCOUNTER — Inpatient Hospital Stay (HOSPITAL_COMMUNITY)
Admission: EM | Admit: 2022-11-10 | Discharge: 2022-11-15 | DRG: 872 | Disposition: A | Discharge status: Home Health | Payer: Medicare Other | Attending: Internal Medicine | Admitting: Internal Medicine

## 2022-11-10 ENCOUNTER — Other Ambulatory Visit: Payer: Self-pay

## 2022-11-10 DIAGNOSIS — Z83438 Family history of other disorder of lipoprotein metabolism and other lipidemia: Secondary | ICD-10-CM

## 2022-11-10 DIAGNOSIS — I959 Hypotension, unspecified: Secondary | ICD-10-CM | POA: Diagnosis present

## 2022-11-10 DIAGNOSIS — W19XXXA Unspecified fall, initial encounter: Secondary | ICD-10-CM | POA: Diagnosis present

## 2022-11-10 DIAGNOSIS — Z820 Family history of epilepsy and other diseases of the nervous system: Secondary | ICD-10-CM

## 2022-11-10 DIAGNOSIS — I5032 Chronic diastolic (congestive) heart failure: Secondary | ICD-10-CM | POA: Diagnosis present

## 2022-11-10 DIAGNOSIS — R55 Syncope and collapse: Secondary | ICD-10-CM | POA: Diagnosis present

## 2022-11-10 DIAGNOSIS — I110 Hypertensive heart disease with heart failure: Secondary | ICD-10-CM | POA: Diagnosis present

## 2022-11-10 DIAGNOSIS — I5030 Unspecified diastolic (congestive) heart failure: Secondary | ICD-10-CM | POA: Diagnosis not present

## 2022-11-10 DIAGNOSIS — Z66 Do not resuscitate: Secondary | ICD-10-CM | POA: Diagnosis present

## 2022-11-10 DIAGNOSIS — R600 Localized edema: Secondary | ICD-10-CM | POA: Diagnosis present

## 2022-11-10 DIAGNOSIS — D649 Anemia, unspecified: Secondary | ICD-10-CM | POA: Diagnosis present

## 2022-11-10 DIAGNOSIS — Z9181 History of falling: Secondary | ICD-10-CM

## 2022-11-10 DIAGNOSIS — N179 Acute kidney failure, unspecified: Secondary | ICD-10-CM | POA: Diagnosis present

## 2022-11-10 DIAGNOSIS — I441 Atrioventricular block, second degree: Secondary | ICD-10-CM | POA: Diagnosis present

## 2022-11-10 DIAGNOSIS — I69311 Memory deficit following cerebral infarction: Secondary | ICD-10-CM | POA: Diagnosis not present

## 2022-11-10 DIAGNOSIS — Z885 Allergy status to narcotic agent status: Secondary | ICD-10-CM

## 2022-11-10 DIAGNOSIS — Z808 Family history of malignant neoplasm of other organs or systems: Secondary | ICD-10-CM

## 2022-11-10 DIAGNOSIS — E871 Hypo-osmolality and hyponatremia: Secondary | ICD-10-CM | POA: Diagnosis present

## 2022-11-10 DIAGNOSIS — L03114 Cellulitis of left upper limb: Secondary | ICD-10-CM | POA: Diagnosis present

## 2022-11-10 DIAGNOSIS — B370 Candidal stomatitis: Secondary | ICD-10-CM | POA: Diagnosis present

## 2022-11-10 DIAGNOSIS — Z9842 Cataract extraction status, left eye: Secondary | ICD-10-CM

## 2022-11-10 DIAGNOSIS — F32A Depression, unspecified: Secondary | ICD-10-CM | POA: Diagnosis present

## 2022-11-10 DIAGNOSIS — E876 Hypokalemia: Secondary | ICD-10-CM | POA: Diagnosis present

## 2022-11-10 DIAGNOSIS — A0472 Enterocolitis due to Clostridium difficile, not specified as recurrent: Secondary | ICD-10-CM

## 2022-11-10 DIAGNOSIS — E861 Hypovolemia: Secondary | ICD-10-CM | POA: Diagnosis present

## 2022-11-10 DIAGNOSIS — Z823 Family history of stroke: Secondary | ICD-10-CM

## 2022-11-10 DIAGNOSIS — K5090 Crohn's disease, unspecified, without complications: Secondary | ICD-10-CM | POA: Diagnosis present

## 2022-11-10 DIAGNOSIS — R578 Other shock: Principal | ICD-10-CM | POA: Diagnosis present

## 2022-11-10 DIAGNOSIS — I808 Phlebitis and thrombophlebitis of other sites: Secondary | ICD-10-CM | POA: Diagnosis present

## 2022-11-10 DIAGNOSIS — Z79899 Other long term (current) drug therapy: Secondary | ICD-10-CM

## 2022-11-10 DIAGNOSIS — Z825 Family history of asthma and other chronic lower respiratory diseases: Secondary | ICD-10-CM

## 2022-11-10 DIAGNOSIS — K529 Noninfective gastroenteritis and colitis, unspecified: Secondary | ICD-10-CM | POA: Diagnosis present

## 2022-11-10 DIAGNOSIS — L03116 Cellulitis of left lower limb: Secondary | ICD-10-CM | POA: Diagnosis present

## 2022-11-10 DIAGNOSIS — Z981 Arthrodesis status: Secondary | ICD-10-CM

## 2022-11-10 DIAGNOSIS — F0153 Vascular dementia, unspecified severity, with mood disturbance: Secondary | ICD-10-CM | POA: Diagnosis present

## 2022-11-10 DIAGNOSIS — F419 Anxiety disorder, unspecified: Secondary | ICD-10-CM | POA: Diagnosis present

## 2022-11-10 DIAGNOSIS — I8002 Phlebitis and thrombophlebitis of superficial vessels of left lower extremity: Secondary | ICD-10-CM | POA: Diagnosis not present

## 2022-11-10 DIAGNOSIS — Z90710 Acquired absence of both cervix and uterus: Secondary | ICD-10-CM

## 2022-11-10 DIAGNOSIS — I9589 Other hypotension: Secondary | ICD-10-CM | POA: Diagnosis not present

## 2022-11-10 DIAGNOSIS — K219 Gastro-esophageal reflux disease without esophagitis: Secondary | ICD-10-CM | POA: Diagnosis present

## 2022-11-10 DIAGNOSIS — E860 Dehydration: Secondary | ICD-10-CM | POA: Diagnosis present

## 2022-11-10 DIAGNOSIS — Z8616 Personal history of COVID-19: Secondary | ICD-10-CM

## 2022-11-10 DIAGNOSIS — Z9841 Cataract extraction status, right eye: Secondary | ICD-10-CM

## 2022-11-10 DIAGNOSIS — F0154 Vascular dementia, unspecified severity, with anxiety: Secondary | ICD-10-CM | POA: Diagnosis present

## 2022-11-10 DIAGNOSIS — R000 Tachycardia, unspecified: Secondary | ICD-10-CM | POA: Diagnosis present

## 2022-11-10 DIAGNOSIS — R300 Dysuria: Secondary | ICD-10-CM | POA: Diagnosis present

## 2022-11-10 DIAGNOSIS — I700 Atherosclerosis of aorta: Secondary | ICD-10-CM | POA: Diagnosis present

## 2022-11-10 DIAGNOSIS — I739 Peripheral vascular disease, unspecified: Secondary | ICD-10-CM | POA: Diagnosis present

## 2022-11-10 DIAGNOSIS — Z833 Family history of diabetes mellitus: Secondary | ICD-10-CM

## 2022-11-10 DIAGNOSIS — K50919 Crohn's disease, unspecified, with unspecified complications: Secondary | ICD-10-CM | POA: Diagnosis not present

## 2022-11-10 DIAGNOSIS — R197 Diarrhea, unspecified: Secondary | ICD-10-CM | POA: Diagnosis not present

## 2022-11-10 DIAGNOSIS — Z85528 Personal history of other malignant neoplasm of kidney: Secondary | ICD-10-CM

## 2022-11-10 DIAGNOSIS — E039 Hypothyroidism, unspecified: Secondary | ICD-10-CM | POA: Diagnosis present

## 2022-11-10 DIAGNOSIS — Z79890 Hormone replacement therapy (postmenopausal): Secondary | ICD-10-CM

## 2022-11-10 DIAGNOSIS — I10 Essential (primary) hypertension: Secondary | ICD-10-CM | POA: Diagnosis present

## 2022-11-10 DIAGNOSIS — R52 Pain, unspecified: Secondary | ICD-10-CM | POA: Diagnosis not present

## 2022-11-10 DIAGNOSIS — Z8349 Family history of other endocrine, nutritional and metabolic diseases: Secondary | ICD-10-CM

## 2022-11-10 DIAGNOSIS — F418 Other specified anxiety disorders: Secondary | ICD-10-CM | POA: Diagnosis present

## 2022-11-10 DIAGNOSIS — Z905 Acquired absence of kidney: Secondary | ICD-10-CM

## 2022-11-10 DIAGNOSIS — Z8711 Personal history of peptic ulcer disease: Secondary | ICD-10-CM

## 2022-11-10 DIAGNOSIS — R509 Fever, unspecified: Secondary | ICD-10-CM

## 2022-11-10 DIAGNOSIS — M797 Fibromyalgia: Secondary | ICD-10-CM | POA: Diagnosis present

## 2022-11-10 DIAGNOSIS — Z8249 Family history of ischemic heart disease and other diseases of the circulatory system: Secondary | ICD-10-CM

## 2022-11-10 LAB — CBC
HCT: 25.4 % — ABNORMAL LOW (ref 36.0–46.0)
HCT: 27.3 % — ABNORMAL LOW (ref 36.0–46.0)
Hemoglobin: 7.9 g/dL — ABNORMAL LOW (ref 12.0–15.0)
Hemoglobin: 8.2 g/dL — ABNORMAL LOW (ref 12.0–15.0)
MCH: 30.9 pg (ref 26.0–34.0)
MCH: 30.6 pg (ref 26.0–34.0)
MCHC: 31.1 g/dL (ref 30.0–36.0)
MCHC: 30 g/dL (ref 30.0–36.0)
MCV: 99.2 fL (ref 80.0–100.0)
MCV: 101.9 fL — ABNORMAL HIGH (ref 80.0–100.0)
Platelets: 301 10*3/uL (ref 150–400)
Platelets: 323 10*3/uL (ref 150–400)
RBC: 2.56 MIL/uL — ABNORMAL LOW (ref 3.87–5.11)
RBC: 2.68 MIL/uL — ABNORMAL LOW (ref 3.87–5.11)
RDW: 14.6 % (ref 11.5–15.5)
RDW: 14.4 % (ref 11.5–15.5)
WBC: 6.1 10*3/uL (ref 4.0–10.5)
WBC: 5.3 10*3/uL (ref 4.0–10.5)
nRBC: 0 % (ref 0.0–0.2)
nRBC: 0 % (ref 0.0–0.2)

## 2022-11-10 LAB — COMPREHENSIVE METABOLIC PANEL
ALT: 17 U/L (ref 0–44)
AST: 19 U/L (ref 15–41)
Albumin: 2.8 g/dL — ABNORMAL LOW (ref 3.5–5.0)
Alkaline Phosphatase: 45 U/L (ref 38–126)
Anion gap: 8 (ref 5–15)
BUN: 11 mg/dL (ref 8–23)
CO2: 22 mmol/L (ref 22–32)
Calcium: 7.8 mg/dL — ABNORMAL LOW (ref 8.9–10.3)
Chloride: 101 mmol/L (ref 98–111)
Creatinine, Ser: 0.61 mg/dL (ref 0.44–1.00)
GFR, Estimated: >60 mL/min (ref >60)
Glucose, Bld: 120 mg/dL — ABNORMAL HIGH (ref 70–99)
Potassium: 3.1 mmol/L — ABNORMAL LOW (ref 3.5–5.1)
Sodium: 131 mmol/L — ABNORMAL LOW (ref 135–145)
Total Bilirubin: 0.4 mg/dL (ref 0.3–1.2)
Total Protein: 5.5 g/dL — ABNORMAL LOW (ref 6.5–8.1)

## 2022-11-10 LAB — TROPONIN I (HIGH SENSITIVITY)
Troponin I (High Sensitivity): 10 ng/L (ref <18)
Troponin I (High Sensitivity): 9 ng/L (ref <18)

## 2022-11-10 LAB — SARS CORONAVIRUS 2 BY RT PCR: SARS Coronavirus 2 by RT PCR: POSITIVE — AB

## 2022-11-10 LAB — BRAIN NATRIURETIC PEPTIDE: B Natriuretic Peptide: 303.1 pg/mL — ABNORMAL HIGH (ref 0.0–100.0)

## 2022-11-10 LAB — TSH: TSH: 2.132 u[IU]/mL (ref 0.350–4.500)

## 2022-11-10 LAB — URINALYSIS, ROUTINE W REFLEX MICROSCOPIC
Bilirubin Urine: NEGATIVE
Glucose, UA: NEGATIVE mg/dL
Hgb urine dipstick: NEGATIVE
Ketones, ur: NEGATIVE mg/dL
Nitrite: NEGATIVE
Protein, ur: NEGATIVE mg/dL
Specific Gravity, Urine: 1.005 (ref 1.005–1.030)
pH: 6 (ref 5.0–8.0)

## 2022-11-10 LAB — CREATININE, SERUM
Creatinine, Ser: 0.66 mg/dL (ref 0.44–1.00)
GFR, Estimated: >60 mL/min (ref >60)

## 2022-11-10 LAB — POC OCCULT BLOOD, ED: Fecal Occult Bld: NEGATIVE

## 2022-11-10 LAB — CBG MONITORING, ED: Glucose-Capillary: 111 mg/dL — ABNORMAL HIGH (ref 70–99)

## 2022-11-10 LAB — MRSA NEXT GEN BY PCR, NASAL: MRSA by PCR Next Gen: NOT DETECTED

## 2022-11-10 MED ORDER — CHLORHEXIDINE GLUCONATE CLOTH 2 % EX PADS
6.0000 | MEDICATED_PAD | Freq: Every day | CUTANEOUS | Status: DC
  Administered 2022-11-10 – 2022-11-12 (×3): 6 via TOPICAL

## 2022-11-10 MED ORDER — ONDANSETRON HCL 4 MG PO TABS: 4.0000 mg | ORAL_TABLET | Freq: Four times a day (QID) | ORAL | Status: DC | PRN

## 2022-11-10 MED ORDER — LACTATED RINGERS IV BOLUS
250.0000 mL | Freq: Once | INTRAVENOUS | Status: AC
  Administered 2022-11-10: 250 mL via INTRAVENOUS

## 2022-11-10 MED ORDER — ACETAMINOPHEN 325 MG PO TABS
650.0000 mg | ORAL_TABLET | Freq: Four times a day (QID) | ORAL | Status: DC | PRN
  Administered 2022-11-11 – 2022-11-14 (×9): 650 mg via ORAL
  Filled 2022-11-10 (×9): qty 2

## 2022-11-10 MED ORDER — PANTOPRAZOLE SODIUM 40 MG PO TBEC
40.0000 mg | DELAYED_RELEASE_TABLET | Freq: Every day | ORAL | Status: DC
  Administered 2022-11-11 – 2022-11-15 (×5): 40 mg via ORAL
  Filled 2022-11-10 (×5): qty 1

## 2022-11-10 MED ORDER — ALPRAZOLAM 0.5 MG PO TABS
0.5000 mg | ORAL_TABLET | Freq: Every day | ORAL | Status: DC
  Administered 2022-11-10 – 2022-11-14 (×5): 0.5 mg via ORAL
  Filled 2022-11-10 (×5): qty 1

## 2022-11-10 MED ORDER — POTASSIUM CHLORIDE CRYS ER 20 MEQ PO TBCR
40.0000 meq | EXTENDED_RELEASE_TABLET | Freq: Once | ORAL | Status: AC
  Administered 2022-11-10: 40 meq via ORAL
  Filled 2022-11-10: qty 2

## 2022-11-10 MED ORDER — CALCIUM POLYCARBOPHIL 625 MG PO TABS
625.0000 mg | ORAL_TABLET | Freq: Every day | ORAL | Status: DC
  Administered 2022-11-11 – 2022-11-15 (×5): 625 mg via ORAL
  Filled 2022-11-10 (×6): qty 1

## 2022-11-10 MED ORDER — LACTATED RINGERS IV BOLUS
500.0000 mL | Freq: Once | INTRAVENOUS | Status: AC
  Administered 2022-11-10: 500 mL via INTRAVENOUS

## 2022-11-10 MED ORDER — ORAL CARE MOUTH RINSE: 15.0000 mL | OROMUCOSAL | Status: DC | PRN

## 2022-11-10 MED ORDER — ENOXAPARIN SODIUM 40 MG/0.4ML IJ SOSY
40.0000 mg | PREFILLED_SYRINGE | INTRAMUSCULAR | Status: DC
  Administered 2022-11-10 – 2022-11-14 (×5): 40 mg via SUBCUTANEOUS
  Filled 2022-11-10 (×5): qty 0.4

## 2022-11-10 MED ORDER — DULOXETINE HCL 60 MG PO CPEP
60.0000 mg | ORAL_CAPSULE | Freq: Every day | ORAL | Status: DC
  Administered 2022-11-11 – 2022-11-15 (×5): 60 mg via ORAL
  Filled 2022-11-10: qty 1
  Filled 2022-11-10: qty 2
  Filled 2022-11-10: qty 1
  Filled 2022-11-10: qty 2
  Filled 2022-11-10: qty 1

## 2022-11-10 MED ORDER — ONDANSETRON HCL 4 MG/2ML IJ SOLN: 4.0000 mg | Freq: Four times a day (QID) | INTRAMUSCULAR | Status: DC | PRN

## 2022-11-10 MED ORDER — ROSUVASTATIN CALCIUM 10 MG PO TABS
10.0000 mg | ORAL_TABLET | Freq: Every evening | ORAL | Status: DC
  Administered 2022-11-10 – 2022-11-14 (×5): 10 mg via ORAL
  Filled 2022-11-10 (×5): qty 1

## 2022-11-10 MED ORDER — ACETAMINOPHEN 650 MG RE SUPP: 650.0000 mg | Freq: Four times a day (QID) | RECTAL | Status: DC | PRN

## 2022-11-10 MED ORDER — LEVOTHYROXINE SODIUM 88 MCG PO TABS
88.0000 ug | ORAL_TABLET | Freq: Every day | ORAL | Status: DC
  Administered 2022-11-11 – 2022-11-15 (×5): 88 ug via ORAL
  Filled 2022-11-10 (×5): qty 1

## 2022-11-10 MED ORDER — MESALAMINE 1.2 G PO TBEC
2.4000 g | DELAYED_RELEASE_TABLET | Freq: Every day | ORAL | Status: DC
  Administered 2022-11-11 – 2022-11-15 (×5): 2.4 g via ORAL
  Filled 2022-11-10 (×5): qty 2

## 2022-11-10 MED ORDER — SODIUM CHLORIDE 0.9 % IV SOLN: INTRAVENOUS | Status: DC

## 2022-11-10 NOTE — ED Notes (Signed)
Pt used bedpan. 250 mL output

## 2022-11-10 NOTE — ED Notes (Signed)
ED TO INPATIENT HANDOFF REPORT  ED Nurse Name and Phone #: [name] [phone]  S Name/Age/Gender [name] 82 y.o. female Room/Bed: WA04/WA04  Code Status   Code Status: Prior  Home/SNF/Other Patient oriented to: self, place, time, and situation Is this baseline? Yes   Triage Complete: Triage complete  Chief Complaint Hypotension (arterial) [I95.9]  Triage Note [name]   Allergies Allergies  Allergen Reactions   Morphine And Related Itching and Rash    Given in IV.    Level of Care/Admitting Diagnosis ED Disposition     ED Disposition  Admit   Condition  --   Comment  Hospital Area: [place] [[id number]]  Level of Care: Stepdown [14]  Admit to SDU based on following criteria: Hemodynamic compromise or significant risk of instability:  Patient requiring short term acute titration and management of vasoactive drips, and invasive monitoring (i.e., CVP and Arterial line).  May [facility] patient to [facility] if equivalent level of care is available:: Yes  Covid Evaluation: Asymptomatic - no recent exposure (last 10 days) testing not required  Diagnosis: Hypotension (arterial) [id number]  Admitting Physician: [name] [id number]  Attending Physician: [name], MIR [name] [id number]  Certification:: I certify this patient will need inpatient services for at least 2 midnights  Estimated Length of Stay: 3          B Medical/Surgery History Past Medical History:  Diagnosis Date   C. 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   COVID    Esophageal stricture    Fibromyalgia    GERD (gastroesophageal reflux disease)    Helicobacter pylori gastritis    Hypertension    Intention tremor    Lumbago    Memory loss    Osteoarthritis of spine    Pneumonia    hx of years ago    PUD  (peptic ulcer disease)    PVD (peripheral vascular disease) ([place])    Past Surgical History:  Procedure Laterality Date   ABDOMINAL HYSTERECTOMY  1985   BIOPSY  08/30/2022   Procedure: BIOPSY;  Surgeon: Yetta Flock, MD;  Location: [facility];  Service: Gastroenterology;;   BREAST ENHANCEMENT SURGERY  1974   CATARACT EXTRACTION Bilateral 2014   [facility]   COLONOSCOPY WITH PROPOFOL N/A 08/30/2022   Procedure: COLONOSCOPY WITH PROPOFOL;  Surgeon: Yetta Flock, MD;  Location: [facility] ENDOSCOPY;  Service: Gastroenterology;  Laterality: N/A;   LAPAROSCOPIC PARTIAL NEPHRECTOMY Left April '13   [facility], Dr. Alinda Money   LAPAROTOMY N/A 07/05/2022   Procedure: EXPLORATORY LAPAROTOMY;  Surgeon: Ileana Roup, MD;  Location: [facility];  Service: General;  Laterality: N/A;   LUMBAR FUSION  03/2003   L4-5   [place]     A IV Location/Drains/Wounds Patient Lines/Drains/Airways Status     Active Line/Drains/Airways     Name Placement date Placement time Site Days   Peripheral IV 11/10/22 20 G Left Antecubital 11/10/22  0801  Antecubital  less than 1   Peripheral IV 11/10/22 22 G Anterior;Proximal;Right Forearm 11/10/22  0900  Forearm  less than 1            Intake/Output Last 24 hours  Intake/Output Summary (Last 24 hours) at 11/10/2022 1546 Last data filed at 11/10/2022 1509 Gross per 24  hour  Intake 1500 ml  Output --  Net 1500 ml    Labs/Imaging Results for orders placed or performed during the hospital encounter of 11/10/22 (from the past 48 hour(s))  CBC     Status: Abnormal   Collection Time: 11/10/22  8:16 AM  Result Value Ref Range   WBC 6.1 4.0 - 10.5 K/uL   RBC 2.56 (L) 3.87 - 5.11 MIL/uL   Hemoglobin 7.9 (L) 12.0 - 15.0 g/dL   HCT 25.4 (L) 36.0 - 46.0 %   MCV 99.2 80.0 - 100.0 fL   MCH 30.9 26.0 - 34.0 pg   MCHC 31.1 30.0 - 36.0 g/dL   RDW 14.6 11.5 - 15.5 %   Platelets 301 150 - 400 K/uL   nRBC 0.0 0.0 - 0.2 %    Comment:  Performed at [facility], [place] [address], [place], [place] [address]  Comprehensive metabolic panel     Status: Abnormal   Collection Time: 11/10/22  8:16 AM  Result Value Ref Range   Sodium 131 (L) 135 - 145 mmol/L   Potassium 3.1 (L) 3.5 - 5.1 mmol/L   Chloride 101 98 - 111 mmol/L   CO2 22 22 - 32 mmol/L   Glucose, Bld 120 (H) 70 - 99 mg/dL    Comment: Glucose reference range applies only to samples taken after fasting for at least 8 hours.   BUN 11 8 - 23 mg/dL   Creatinine, Ser 0.61 0.44 - 1.00 mg/dL   Calcium 7.8 (L) 8.9 - 10.3 mg/dL   Total Protein 5.5 (L) 6.5 - 8.1 g/dL   Albumin 2.8 (L) 3.5 - 5.0 g/dL   AST 19 15 - 41 U/L   ALT 17 0 - 44 U/L   Alkaline Phosphatase 45 38 - 126 U/L   Total Bilirubin 0.4 0.3 - 1.2 mg/dL   GFR, Estimated >60 >60 mL/min    Comment: (NOTE) Calculated using the CKD-EPI Creatinine Equation (2021)    Anion gap 8 5 - 15    Comment: Performed at [facility], [place] [address], [place], [place] [address]  POC CBG, ED     Status: Abnormal   Collection Time: 11/10/22  9:02 AM  Result Value Ref Range   Glucose-Capillary 111 (H) 70 - 99 mg/dL    Comment: Glucose reference range applies only to samples taken after fasting for at least 8 hours.  Brain natriuretic peptide     Status: Abnormal   Collection Time: 11/10/22  9:11 AM  Result Value Ref Range   B Natriuretic Peptide 303.1 (H) 0.0 - 100.0 pg/mL    Comment: Performed at [facility], [place] [address], [place], [place] [address]  Troponin I (High Sensitivity)     Status: None   Collection Time: 11/10/22  9:11 AM  Result Value Ref Range   Troponin I (High Sensitivity) 10 <18 ng/L    Comment: (NOTE) Elevated high sensitivity troponin I (hsTnI) values and significant  changes across serial measurements may suggest ACS but many other  chronic and acute conditions are known to elevate hsTnI results.  Refer to the "Links" section for chest pain  algorithms and additional  guidance. Performed at [facility], [place] [address], [place], [place] [address]   SARS Coronavirus 2 by RT PCR (hospital order, performed in [facility] hospital lab) *cepheid single result test* Anterior Nasal Swab     Status: Abnormal   Collection Time: 11/10/22  9:17  AM   Specimen: Anterior Nasal Swab  Result Value Ref Range   SARS Coronavirus 2 by RT PCR POSITIVE (A) NEGATIVE    Comment: (NOTE) SARS-CoV-2 target nucleic acids are DETECTED  SARS-CoV-2 RNA is generally detectable in upper respiratory specimens  during the acute phase of infection.  Positive results are indicative  of the presence of the identified virus, but do not rule out bacterial infection or co-infection with other pathogens not detected by the test.  Clinical correlation with patient history and  other diagnostic information is necessary to determine patient infection status.  The expected result is negative.  Fact Sheet for Patients:   https://www.patel.info/   Fact Sheet for Healthcare Providers:   https://hall.com/    This test is not yet approved or cleared by the [place] FDA and  has been authorized for detection and/or diagnosis of SARS-CoV-2 by FDA under an Emergency Use Authorization (EUA).  This EUA will remain in effect (meaning this test can be used) for the duration of  the COVID-19 declaration under Section 564(b)(1)  of the Act, 21 U.S.C. section 360-bbb-3(b)(1), unless the authorization is terminated or revoked sooner.   Performed at [facility], [place] [address] [place], [place] [address]   Urinalysis, Routine w reflex microscopic -Urine, Clean Catch     Status: Abnormal   Collection Time: 11/10/22  9:40 AM  Result Value Ref Range   Color, Urine YELLOW YELLOW   APPearance CLEAR CLEAR   Specific Gravity, Urine 1.005 1.005 - 1.030   pH 6.0 5.0 - 8.0   Glucose, UA NEGATIVE  NEGATIVE mg/dL   Hgb urine dipstick NEGATIVE NEGATIVE   Bilirubin Urine NEGATIVE NEGATIVE   Ketones, ur NEGATIVE NEGATIVE mg/dL   Protein, ur NEGATIVE NEGATIVE mg/dL   Nitrite NEGATIVE NEGATIVE   Leukocytes,Ua MODERATE (A) NEGATIVE   RBC / HPF 0-5 0 - 5 RBC/hpf   WBC, UA 0-5 0 - 5 WBC/hpf   Bacteria, UA RARE (A) NONE SEEN   Squamous Epithelial / HPF 0-5 0 - 5 /HPF   Hyaline Casts, UA PRESENT     Comment: Performed at [facility], [place] [address] [place], [place] [address]  POC occult blood, ED     Status: None   Collection Time: 11/10/22  9:48 AM  Result Value Ref Range   Fecal Occult Bld NEGATIVE NEGATIVE  Troponin I (High Sensitivity)     Status: None   Collection Time: 11/10/22 11:11 AM  Result Value Ref Range   Troponin I (High Sensitivity) 9 <18 ng/L    Comment: (NOTE) Elevated high sensitivity troponin I (hsTnI) values and significant  changes across serial measurements may suggest ACS but many other  chronic and acute conditions are known to elevate hsTnI results.  Refer to the "Links" section for chest pain algorithms and additional  guidance. Performed at [facility], [place] [address], [place], [place] [address]    DG Chest Portable 1 View  Result Date: 11/10/2022 CLINICAL DATA:  82 year old female with history of hypotension. EXAM: PORTABLE CHEST 1 VIEW COMPARISON:  Chest x-ray 10/17/2022. FINDINGS: Lung volumes are normal. No consolidative airspace disease. No pleural effusions. No pneumothorax. No pulmonary nodule or mass noted. Pulmonary vasculature and the cardiomediastinal silhouette are within normal limits. IMPRESSION: No radiographic evidence of acute cardiopulmonary disease. Electronically Signed   By: Vinnie Langton M.D.   On: 11/10/2022 09:40    Pending Labs Unresulted Labs (From admission, onward)    None  Vitals/Pain Today's Vitals   11/10/22 1410 11/10/22 1512 11/10/22 1535 11/10/22 1540  BP: (!) 94/48  (!) 95/45  (!) 96/47  Pulse: 64 65 68   Resp: 18 18 15 17   Temp:      TempSrc:      SpO2: 100% 90% 97%   Weight:      Height:      PainSc:        Isolation Precautions Airborne and Contact precautions  Medications Medications  0.9 %  sodium chloride infusion ( Intravenous New Bag/Given 11/10/22 1528)  lactated ringers bolus 250 mL (0 mLs Intravenous Stopped 11/10/22 0947)  lactated ringers bolus 250 mL (0 mLs Intravenous Stopped 11/10/22 1020)  lactated ringers bolus 500 mL (0 mLs Intravenous Stopped 11/10/22 1142)  lactated ringers bolus 500 mL (0 mLs Intravenous Stopped 11/10/22 1509)    Mobility walks with device     Focused Assessments   R Recommendations: See Admitting Provider Note  Report given to:   Additional Notes:

## 2022-11-10 NOTE — ED Provider Notes (Signed)
[place] EMERGENCY DEPARTMENT AT [facility] Provider Note   CSN: [id number] Arrival date & time: 11/10/22  [id number]     History  Chief Complaint  Patient presents with   [name]    [name] is a 82 y.o. female.   Fall     82 year old female with medical history significant for HTN, fibromyalgia, inflammatory bowel disease, recent exploratory laparotomy during which part of her intestine was removed, recent C. difficile infection status posttreatment, recent COVID infection, diastolic CHF on lisinopril and metoprolol and Lasix who presents to the emergency department with hypotension and fatigue.  The patient states that she has been passing out/"falling asleep" while at rest over the past few days.  She has been extremely fatigued.  She presents from [facility] facility.  Last night, she had 4 episodes of watery diarrhea.  She fell twice today, denies clear loss of consciousness but he is describing what appears to be episodes of syncope.  She was hypotensive with EMS and was administered 500 cc of IV fluids and route.  She states that she took her blood pressure medications this morning to include Lasix and metoprolol.  She has been experiencing chills, denies any fevers.  She endorses frequency but states that she is on Lasix.  She denies any dysuria.  She denies any fevers.  She denies any abdominal pain, cough, chest pain, shortness of breath.  Home Medications Prior to Admission medications   Medication Sig Start Date End Date Taking? Authorizing Provider  acetaminophen (TYLENOL) 325 MG tablet Take 650 mg by mouth 2 (two) times daily as needed for mild pain or headache.   Yes [provider]  ALPRAZolam (XANAX) 0.5 MG tablet Take 1 tablet (0.5 mg total) by mouth 2 (two) times daily as needed. for anxiety Patient taking differently: Take 0.5 mg by mouth at bedtime. for anxiety 07/16/22  Yes Charlynne Cousins, MD  Calcium Carbonate  Antacid (TUMS PO) Take 2 tablets by mouth as needed (reflux.).   Yes [provider]  Carboxymethylcellulose Sodium (EYE DROPS OP) Place 2 drops into both eyes in the morning and at bedtime.   Yes [provider]  Cyanocobalamin (VITAMIN B-12 PO) Take 1 capsule by mouth daily.   Yes [provider]  DULoxetine (CYMBALTA) 60 MG capsule Take 60 mg by mouth daily.   Yes [provider]  FIBER PO Take 1 tablet by mouth daily.   Yes [provider]  furosemide (LASIX) 40 MG tablet Take 40 mg by mouth daily.   Yes [provider]  levothyroxine (SYNTHROID) 88 MCG tablet Take 88 mcg by mouth every morning. 09/13/22  Yes [provider]  mesalamine (LIALDA) 1.2 g EC tablet Take 2 tablets (2.4 g total) by mouth daily with breakfast. 10/23/22  Yes Armbruster, Carlota Raspberry, MD  metoprolol succinate (TOPROL-XL) 25 MG 24 hr tablet Take 25 mg by mouth daily.   Yes [provider]  Multiple Vitamins-Minerals (MULTIVITAMIN WITH MINERALS) tablet Take 1 tablet by mouth daily.   Yes [provider]  omeprazole (PRILOSEC) 40 MG capsule Take 1 capsule (40 mg total) by mouth daily. NEED ANNUAL VISIT FOR FURTHER REFILLS Patient taking differently: Take 40 mg by mouth daily. 04/29/17  Yes Hoyt Koch, MD  ondansetron (ZOFRAN) 4 MG tablet Take 1 tablet (4 mg total) by mouth every 6 (six) hours as needed for nausea. 10/19/22  Yes Danford, Suann Larry, MD  rosuvastatin (CRESTOR) 10 MG tablet  Take 10 mg by mouth every evening.   Yes [provider]      Allergies    Morphine and related    Review of Systems   Review of Systems  All other systems reviewed and are negative.   Physical Exam Updated Vital Signs BP (!) 93/33   Pulse 75   Temp 98 F (36.7 C) (Oral)   Resp 15   Ht 5\' 2"  (1.575 m)   Wt 63.5 kg   SpO2 99%   BMI 25.60 kg/m  Physical Exam Vitals and nursing note reviewed.  Constitutional:      General: She is  not in acute distress.    Appearance: She is well-developed.  HENT:     Head: Normocephalic and atraumatic.     Mouth/Throat:     Mouth: Mucous membranes are dry.  Eyes:     Conjunctiva/sclera: Conjunctivae normal.  Cardiovascular:     Rate and Rhythm: Normal rate and regular rhythm.  Pulmonary:     Effort: Pulmonary effort is normal. No respiratory distress.     Comments: Bibasilar crackles, lungs otherwise clear Abdominal:     Palpations: Abdomen is soft.     Tenderness: There is no abdominal tenderness.  Musculoskeletal:     Cervical back: Neck supple.     Right lower leg: Edema present.     Left lower leg: Edema present.     Comments: Trace bilateral lower extremity edema  Skin:    General: Skin is warm and dry.     Capillary Refill: Capillary refill takes less than 2 seconds.  Neurological:     Mental Status: She is alert.  Psychiatric:        Mood and Affect: Mood normal.     ED Results / Procedures / Treatments   Labs (all labs ordered are listed, but only abnormal results are displayed) Labs Reviewed  SARS CORONAVIRUS 2 BY RT PCR - Abnormal; Notable for the following components:      Result Value   SARS Coronavirus 2 by RT PCR POSITIVE (*)    All other components within normal limits  CBC - Abnormal; Notable for the following components:   RBC 2.56 (*)    Hemoglobin 7.9 (*)    HCT 25.4 (*)    All other components within normal limits  COMPREHENSIVE METABOLIC PANEL - Abnormal; Notable for the following components:   Sodium 131 (*)    Potassium 3.1 (*)    Glucose, Bld 120 (*)    Calcium 7.8 (*)    Total Protein 5.5 (*)    Albumin 2.8 (*)    All other components within normal limits  BRAIN NATRIURETIC PEPTIDE - Abnormal; Notable for the following components:   B Natriuretic Peptide 303.1 (*)    All other components within normal limits  URINALYSIS, ROUTINE W REFLEX MICROSCOPIC - Abnormal; Notable for the following components:   Leukocytes,Ua MODERATE (*)     Bacteria, UA RARE (*)    All other components within normal limits  CBG MONITORING, ED - Abnormal; Notable for the following components:   Glucose-Capillary 111 (*)    All other components within normal limits  MRSA NEXT GEN BY PCR, NASAL  TSH  CBC  CREATININE, SERUM  CBC  BASIC METABOLIC PANEL  POC OCCULT BLOOD, ED  TROPONIN I (HIGH SENSITIVITY)  TROPONIN I (HIGH SENSITIVITY)    EKG EKG Interpretation  Date/Time:  Sunday November 10 2022 08:17:22 EDT Ventricular Rate:  78 PR Interval:  141 QRS Duration: 86 QT Interval:  374 QTC Calculation: 426 R Axis:   68 Text Interpretation: Sinus rhythm Atrial premature complexes Borderline T abnormalities, inferior leads Confirmed by Regan Lemming ([id number]) on 11/10/2022 9:14:19 AM  Radiology DG Chest Portable 1 View  Result Date: 11/10/2022 CLINICAL DATA:  82 year old female with history of hypotension. EXAM: PORTABLE CHEST 1 VIEW COMPARISON:  Chest x-ray 10/17/2022. FINDINGS: Lung volumes are normal. No consolidative airspace disease. No pleural effusions. No pneumothorax. No pulmonary nodule or mass noted. Pulmonary vasculature and the cardiomediastinal silhouette are within normal limits. IMPRESSION: No radiographic evidence of acute cardiopulmonary disease. Electronically Signed   By: Vinnie Langton M.D.   On: 11/10/2022 09:40    Procedures .Critical Care  Performed by: Regan Lemming, MD Authorized by: Regan Lemming, MD   Critical care provider statement:    Critical care time (minutes):  30   Critical care was necessary to treat or prevent imminent or life-threatening deterioration of the following conditions:  Dehydration   Critical care was time spent personally by me on the following activities:  Development of treatment plan with patient or surrogate, discussions with consultants, evaluation of patient's response to treatment, examination of patient, ordering and review of laboratory studies, ordering and review of  radiographic studies, ordering and performing treatments and interventions, pulse oximetry, re-evaluation of patient's condition and review of old charts   Care discussed with: admitting provider       Medications Ordered in ED Medications  0.9 %  sodium chloride infusion ( Intravenous Infusion Verify 11/10/22 1709)  rosuvastatin (CRESTOR) tablet 10 mg (10 mg Oral Given 11/10/22 1753)  ALPRAZolam (XANAX) tablet 0.5 mg (has no administration in time range)  DULoxetine (CYMBALTA) DR capsule 60 mg (has no administration in time range)  levothyroxine (SYNTHROID) tablet 88 mcg (has no administration in time range)  polycarbophil (FIBERCON) tablet 625 mg (has no administration in time range)  mesalamine (LIALDA) EC tablet 2.4 g (has no administration in time range)  pantoprazole (PROTONIX) EC tablet 40 mg (has no administration in time range)  enoxaparin (LOVENOX) injection 40 mg (has no administration in time range)  acetaminophen (TYLENOL) tablet 650 mg (has no administration in time range)    Or  acetaminophen (TYLENOL) suppository 650 mg (has no administration in time range)  ondansetron (ZOFRAN) tablet 4 mg (has no administration in time range)    Or  ondansetron (ZOFRAN) injection 4 mg (has no administration in time range)  Chlorhexidine Gluconate Cloth 2 % PADS 6 each (6 each Topical Given 11/10/22 1716)  Oral care mouth rinse (has no administration in time range)  lactated ringers bolus 250 mL (0 mLs Intravenous Stopped 11/10/22 0947)  lactated ringers bolus 250 mL (0 mLs Intravenous Stopped 11/10/22 1020)  lactated ringers bolus 500 mL (0 mLs Intravenous Stopped 11/10/22 1142)  lactated ringers bolus 500 mL (0 mLs Intravenous Stopped 11/10/22 1509)    ED Course/ Medical Decision Making/ A&P Clinical Course as of 11/10/22 1755  Sun Nov 10, 2022  1242 SARS Coronavirus 2 by RT PCR(!): POSITIVE [JL]    Clinical Course User Index [JL] Regan Lemming, MD                              Medical Decision Making Amount and/or Complexity of Data Reviewed Labs: ordered. Decision-making details documented in ED Course. Radiology: ordered.  Risk Decision regarding hospitalization.   82 year old female with medical history significant for HTN,  fibromyalgia, inflammatory bowel disease, recent exploratory laparotomy during which part of her intestine was removed, recent C. difficile infection status posttreatment, recent COVID infection, diastolic CHF on lisinopril and metoprolol and Lasix who presents to the emergency department with hypotension and fatigue.  The patient states that she has been passing out/"falling asleep" while at rest over the past few days.  She has been extremely fatigued.  She presents from [facility] facility.  Last night, she had 4 episodes of watery diarrhea.  She fell twice today, denies clear loss of consciousness but he is describing what appears to be episodes of syncope.  She was hypotensive with EMS and was administered 500 cc of IV fluids and route.  She states that she took her blood pressure medications this morning to include Lasix and metoprolol.  She has been experiencing chills, denies any fevers.  She endorses frequency but states that she is on Lasix.  She denies any dysuria.  She denies any fevers.  She denies any abdominal pain, cough, chest pain, shortness of breath.  On arrival, the patient was afebrile, temperature 98, not tachycardic or tachypneic, BP 146/45, saturating 98% on room air.  Sinus rhythm noted on cardiac telemetry.  Patient presenting with an episode of hypotension and episodes of near syncope/syncope over the past few days.  She has been feeling lightheaded and fatigued.  She has had multiple episodes of watery diarrhea.  She recently completed a course of treatment for C. difficile.  She also was COVID-positive as of 2 weeks ago.  Concern for dehydration with dry mucous membranes, diarrheal illness,  hypotension complicated by potential dehydration and hypovolemia and her home antihypertensives to include Lasix, lisinopril and metoprolol.  Additionally considered infectious etiology of the patient's hypotension however unclear if the patient is experiencing dysuria or having frequency associated with her Lasix use.  No fever.  No abdominal pain.  A bedside point-of-care ultrasound was performed which did not reveal a plethoric IVC, did not reveal a collapsed IVC either.  Patient appears dry.  Blood pressure down trended in the ED and the patient was administered additional IV fluid boluses.  An EKG was performed revealed sinus rhythm, ventricular rate 78, borderline T wave changes, no acute ST segment changes.  Chest x-ray was performed revealed clear lungs.  Laboratory evaluation significant for CBC without a leukocytosis, mild anemia at 7.9, fecal occult negative, COVID-positive however recently +2 weeks ago, CMP with mild hypokalemia 3.1, no AKI, creatinine 0.61, TSH normal, CBG 111, BNP nonspecifically elevated to 303, urinalysis without convincing evidence for UTI, troponin normal.  Concern for dehydration in the setting of diarrheal illness.  Given the patient's history of heart failure, [facility] was consulted for further recommendations, agreed with holding the patient's home blood pressure medications, likely hypotension secondary to dehydration, recommended fluid resuscitation.  Plan for echocardiogram in the hospital given the patient's history of diastolic CHF.  The patient was fluid responsive however with soft blood pressures persistently in the emergency department.  Hospitalist medicine was consulted for admission for further observation and management to a stepdown status, low threshold for escalation to the ICU if the patient remains fluid unresponsive and required pressors.  Dr. Renaee Munda was consulted and accepted the patient in admission.   Final Clinical Impression(s) /  ED Diagnoses Final diagnoses:  Hypotension, unspecified hypotension type  Diastolic congestive heart failure, unspecified HF chronicity (HCC)  Diarrhea, unspecified type    Rx / DC Orders ED Discharge Orders  None         Regan Lemming, MD 11/10/22 1758

## 2022-11-10 NOTE — Consult Note (Signed)
CARDIOLOGY CONSULT NOTE  Patient ID: [name] MRN: [id number] DOB/AGE: 03-18-1941 82 y.o.  Admit date: 11/10/2022 Referring Physician  Dr Armandina Gemma Primary Physician:  Reynold Bowen, MD Reason for Consultation  hypotension  Patient ID: [name], female    DOB: 19-May-1941, 82 y.o.   MRN: [id number]  Chief Complaint  Patient presents with   Fall   HPI:    [name]  is a 82 y.o.  female with past medical history significant for hypertension, Crohn's disease, and fibromyalgia who presented to the ED with weakness and low blood pressure. I first saw her in November 2023 because she had been having dyspnea on exertion and her daughter noticed the patient almost passed out while eating dinner with her. I ordered an echocardiogram, stress test, and event monitor to work her up for these complaints but the tests were not obtained as she ended up in the hospital with ischemic bowel and underwent emergent ex-lap. A part of her intestine was removed and she was diagnosed with Crohn's disease. She has been having a difficult recovery complicated by c.diff colitis and then recently she had Covid infection. Patient did have a scheduled follow-up visit with me last Friday however she called and canceled due to experiencing chills and just not feeling well overall.  She is receiving IV fluids in the ED right now and she is feeling a lot better.  Upon further discussion it sounds like patient has UTI symptoms including frequency, urgency, and dysuria.  Even when she does not take Lasix she still experiences the symptoms but she has never had a UTI in the past so she did not know what to make of it.  Patient feels very tired however she did have syncopal events when I first saw her so we will need to place her event monitor at our next follow-up visit.  When she works with physical therapy she does not have any chest pain or shortness of breath.  Past Medical History:  Diagnosis Date   C.  difficile colitis    Cancer ([place]) 2014   kidney   COVID    Esophageal stricture    Fibromyalgia    GERD (gastroesophageal reflux disease)    Helicobacter pylori gastritis    Hypertension    Intention tremor    Lumbago    Memory loss    Osteoarthritis of spine    Pneumonia    hx of years ago    PUD (peptic ulcer disease)    PVD (peripheral vascular disease) ([place])    Past Surgical History:  Procedure Laterality Date   ABDOMINAL HYSTERECTOMY  1985   BIOPSY  08/30/2022   Procedure: BIOPSY;  Surgeon: Yetta Flock, MD;  Location: [place];  Service: Gastroenterology;;   BREAST ENHANCEMENT [place]   CATARACT EXTRACTION Bilateral 2014   [facility]   COLONOSCOPY WITH PROPOFOL N/A 08/30/2022   Procedure: COLONOSCOPY WITH PROPOFOL;  Surgeon: Yetta Flock, MD;  Location: [facility];  Service: Gastroenterology;  Laterality: N/A;   LAPAROSCOPIC PARTIAL NEPHRECTOMY Left April '13   [facility], Dr. Alinda Money   LAPAROTOMY N/A 07/05/2022   Procedure: EXPLORATORY LAPAROTOMY;  Surgeon: Ileana Roup, MD;  Location: [facility];  Service: General;  Laterality: N/A;   LUMBAR FUSION  03/2003   L4-5   [place]   Social History   Tobacco Use   Smoking status: Never   Smokeless tobacco: Never  Substance Use Topics  Alcohol use: Not Currently    Alcohol/week: 7.0 standard drinks of alcohol    Types: 7 Glasses of wine per week    Comment: Patient no longer drinks alcohol due to her Chrohn's disease    Family History  Problem Relation Age of Onset   Heart disease Mother    COPD Mother    Thyroid disease Mother    Diabetes Father    Heart disease Father        CABG   Stroke Father    Parkinson's disease Father    Heart disease Brother    Stroke Brother    Hyperlipidemia Brother    Diabetes Daughter    Hearing loss Brother    Crohn's disease Other        neice   Hypertension Son    Diabetes Son    Melanoma Son    Colon cancer Neg Hx     Stomach cancer Neg Hx    Esophageal cancer Neg Hx     Marital Status: Widowed  ROS  Review of Systems  Constitutional: Positive for chills and malaise/fatigue.  Gastrointestinal:  Positive for diarrhea.  Genitourinary:  Positive for dysuria, frequency and incomplete emptying.   Objective      11/10/2022    1:30 PM 11/10/2022    1:00 PM 11/10/2022   12:00 PM  Vitals with BMI  Systolic 90 99 98  Diastolic 43 59 49  Pulse 67 68 69    Blood pressure (!) 90/43, pulse 67, temperature (!) 97.4 F (36.3 C), temperature source Oral, resp. rate 17, height 5\' 2"  (1.575 m), weight 59 kg, SpO2 98 %.    Physical Exam Vitals reviewed.  HENT:     Head: Normocephalic.  Cardiovascular:     Rate and Rhythm: Normal rate and regular rhythm.     Pulses: Normal pulses.     Heart sounds: Normal heart sounds.  Pulmonary:     Effort: Pulmonary effort is normal.  Abdominal:     General: Bowel sounds are normal.  Musculoskeletal:     Right lower leg: Edema (trace) present.     Left lower leg: Edema (trace) present.  Skin:    General: Skin is warm and dry.  Neurological:     Mental Status: She is alert.    Laboratory examination:   Recent Labs    10/18/22 0421 10/20/22 1752 11/10/22 0816  NA 133* 134* 131*  K 3.7 4.2 3.1*  CL 103 102 101  CO2 21* 20* 22  GLUCOSE 94 144* 120*  BUN 14 10 11   CREATININE 0.67 0.58 0.61  CALCIUM 7.6* 8.6* 7.8*  GFRNONAA >60 >60 >60   estimated creatinine clearance is 43.6 mL/min (by C-G formula based on SCr of 0.61 mg/dL).     Latest Ref Rng & Units 11/10/2022    8:16 AM 10/20/2022    5:52 PM 10/18/2022    4:21 AM  CMP  Glucose 70 - 99 mg/dL 120  144  94   BUN 8 - 23 mg/dL 11  10  14    Creatinine 0.44 - 1.00 mg/dL 0.61  0.58  0.67   Sodium 135 - 145 mmol/L 131  134  133   Potassium 3.5 - 5.1 mmol/L 3.1  4.2  3.7   Chloride 98 - 111 mmol/L 101  102  103   CO2 22 - 32 mmol/L 22  20  21    Calcium 8.9 - 10.3 mg/dL 7.8  8.6  7.6   Total  Protein  6.5 - 8.1 g/dL 5.5  5.5    Total Bilirubin 0.3 - 1.2 mg/dL 0.4  0.2    Alkaline Phos 38 - 126 U/L 45  62    AST 15 - 41 U/L 19  26    ALT 0 - 44 U/L 17  20        Latest Ref Rng & Units 11/10/2022    8:16 AM 10/20/2022    5:52 PM 10/19/2022    4:13 AM  CBC  WBC 4.0 - 10.5 K/uL 6.1  4.9  5.3   Hemoglobin 12.0 - 15.0 g/dL 7.9  9.9  7.5   Hematocrit 36.0 - 46.0 % 25.4  32.4  23.7   Platelets 150 - 400 K/uL 301  309  231    Lipid Panel Recent Labs    05/01/22 0757 07/06/22 0312  CHOL 141  --   TRIG 46 93  LDLCALC 80  --   VLDL 9  --   HDL 52  --   CHOLHDL 2.7  --     HEMOGLOBIN A1C Lab Results  Component Value Date   HGBA1C 5.7 (H) 04/30/2022   MPG 116.89 04/30/2022   TSH Recent Labs    04/30/22 1247 07/05/22 2143 08/29/22 0424  TSH 2.444 4.392 4.935*   BNP (last 3 results) Recent Labs    11/10/22 0911  BNP 303.1*   Cardiac Panel (last 3 results) Recent Labs    11/10/22 0911 11/10/22 1111  TROPONINIHS 10 9     Medications and allergies   Allergies  Allergen Reactions   Morphine And Related Itching and Rash    Given in IV.     Current Meds  Medication Sig   acetaminophen (TYLENOL) 325 MG tablet Take 650 mg by mouth 2 (two) times daily as needed for mild pain or headache.   ALPRAZolam (XANAX) 0.5 MG tablet Take 1 tablet (0.5 mg total) by mouth 2 (two) times daily as needed. for anxiety (Patient taking differently: Take 0.5 mg by mouth at bedtime. for anxiety)   Calcium Carbonate Antacid (TUMS PO) Take 2 tablets by mouth as needed (reflux.).   Carboxymethylcellulose Sodium (EYE DROPS OP) Place 2 drops into both eyes in the morning and at bedtime.   Cyanocobalamin (VITAMIN B-12 PO) Take 1 capsule by mouth daily.   DULoxetine (CYMBALTA) 60 MG capsule Take 60 mg by mouth daily.   FIBER PO Take 1 tablet by mouth daily.   furosemide (LASIX) 40 MG tablet Take 40 mg by mouth daily.   levothyroxine (SYNTHROID) 88 MCG tablet Take 88 mcg by mouth every morning.    mesalamine (LIALDA) 1.2 g EC tablet Take 2 tablets (2.4 g total) by mouth daily with breakfast.   metoprolol succinate (TOPROL-XL) 25 MG 24 hr tablet Take 25 mg by mouth daily.   Multiple Vitamins-Minerals (MULTIVITAMIN WITH MINERALS) tablet Take 1 tablet by mouth daily.   omeprazole (PRILOSEC) 40 MG capsule Take 1 capsule (40 mg total) by mouth daily. NEED ANNUAL VISIT FOR FURTHER REFILLS (Patient taking differently: Take 40 mg by mouth daily.)   ondansetron (ZOFRAN) 4 MG tablet Take 1 tablet (4 mg total) by mouth every 6 (six) hours as needed for nausea.   rosuvastatin (CRESTOR) 10 MG tablet Take 10 mg by mouth every evening.    Scheduled Meds: Continuous Infusions: PRN Meds:.   No intake/output data recorded. Total I/O In: 1000 [IV [id number] Out: -   Net IO Since Admission: 1,000 mL [11/10/22 1359]  Radiology:   Imaging results have been reviewed and DG Chest Portable 1 View  Result Date: 11/10/2022 CLINICAL DATA:  82 year old female with history of hypotension. EXAM: PORTABLE CHEST 1 VIEW COMPARISON:  Chest x-ray 10/17/2022. FINDINGS: Lung volumes are normal. No consolidative airspace disease. No pleural effusions. No pneumothorax. No pulmonary nodule or mass noted. Pulmonary vasculature and the cardiomediastinal silhouette are within normal limits. IMPRESSION: No radiographic evidence of acute cardiopulmonary disease. Electronically Signed   By: Vinnie Langton M.D.   On: 11/10/2022 09:40    Cardiac Studies:   07/07/2022 Summary LE Venous Duplex:  RIGHT:  - There is no evidence of deep vein thrombosis in the lower extremity.  However, portions of this examination were limited- see technologist  comments above.  - No cystic structure found in the popliteal fossa.  LEFT:  - There is no evidence of deep vein thrombosis in the lower extremity.  However, portions of this examination were limited- see technologist  comments above.  - No cystic structure found in the  popliteal fossa.      IMPRESSIONS ECHO 07/06/2022  1. Left ventricular ejection fraction, by estimation, is 50%. Left  ventricular ejection fraction by 2D MOD biplane is 45.8 %. The left  ventricle has low normal function. The left ventricle has no regional wall  motion abnormalities. Left ventricular  diastolic parameters are consistent with Grade I diastolic dysfunction  (impaired relaxation).   2. Right ventricular systolic function is normal. The right ventricular  size is normal.   3. The mitral valve is grossly normal. Trivial mitral valve  regurgitation.   4. The aortic valve is calcified. Aortic valve regurgitation is not  visualized. Aortic valve sclerosis is present, with no evidence of aortic  valve stenosis.  In 04/2022, LVEF was 6-65%.   EKG: 11/10/2022: Sinus rhythm with PAC's. Non-specific T wave abnormalities in inferior leads.  06/27/2022: NSR with rate variation, normal R wave progression, no evidence of ischemia   06/21/2022: NSR with second degree type I AV block, prolonged QTc  Assessment   Hypotension 2/2 dehydration Continue to hold Lasix. Hold lisinopril. Can restart Toprol XL 12.5 mg if needed for tachycardia. She is receiving IVF and feeling much better. Patient complaining of UTI symptoms.   Fatigue versus syncope  We have not been able to obtain event monitor due to multiple abdominal surgeries and extensive rehab stays. Patient states she feels tired all the time but she could be having syncopal episodes. We ordered event monitor at one of our initial visits but has not been placed yet, will place monitor on follow-up.   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 Have not been able to obtain stress test due to clinical condition after she had emergent ex-lap. Will obtain as outpatient. She is hypovolemic, likely combination of Lasix and diarrhea. She also recently had Covid  infection.  Hold Lasix for 1 week. Continue to wear compression stockings. Hold lisinopril and restart Toprol XL 12.5 mg if BP/HR allows. She has no anginal symptoms when working with PT/OT. Suspect reduction in EF from 60-65% in 04/2022 to low normal 50% in 06/2022 was due to septic shock rather than ischemia.   Crohn's disease complicated by c.diff colitis Her gut is still not working like it was prior to her bowel ischemia and emergency ex-lap. She is following with GI and slowly introducing new foods to her diet but this is certainly contributing to her  dehydration as she frequently has diarrhea now.     Floydene Flock, DO, [facility] 11/10/2022, 1:59 PM Office: [phone]

## 2022-11-10 NOTE — ED Notes (Signed)
Pt very sleepy, and falls asleep in the middle of sentences. Pt stated she has been feeling like this for the past few days.

## 2022-11-10 NOTE — ED Notes (Signed)
Pt drinking water, and repositioned in bed

## 2022-11-10 NOTE — ED Triage Notes (Signed)
[facility] report pt coming [place] [facility] and has been falling for the past couple weeks. Pt fell twice today. Pt denies any injuries.

## 2022-11-10 NOTE — H&P (Signed)
History and Physical  [name] [id number] DOB: 20-Nov-1940 DOA: 11/10/2022  PCP: Reynold Bowen, MD   Chief Complaint: Weakness, dizziness  HPI: [name] is a 82 y.o. female with medical history significant for essential hypertension, inflammatory bowel disease, hypothyroidism, fibromyalgia, recent hospitalization for syncope, ex lap and ex lap and part of her intestine was removed.  After this, recovery was complicated by a couple of bouts of C. difficile and she also recently had COVID.  In the meantime she went to rehab and now lives at an assisted living. She has continued to take her diuretics, while having diarrhea intermittently. She states her BPs are starting to be more solid likely due to her following her Crohn's diet; yesterday she did have 4 more loose non-formed BMs, but none today. No abdominal pain, cramping or fevers. She came to ER for evaluation of low BP, the last 3-4 days she has been very weak, lethargic and she feels likely having some syncope but denies any falls.   ED Course: In the ER, BP as low as 73/45, seems to be fluid responsive but remains at 96/47. Was seen by cardiology who feels this is likely   Review of Systems: Please see HPI for pertinent positives and negatives. A complete 10 system review of systems are otherwise negative.  Past Medical History:  Diagnosis Date   C. difficile colitis    Cancer ([place]) 2014   kidney   COVID    Esophageal stricture    Fibromyalgia    GERD (gastroesophageal reflux disease)    Helicobacter pylori gastritis    Hypertension    Intention tremor    Lumbago    Memory loss    Osteoarthritis of spine    Pneumonia    hx of years ago    PUD (peptic ulcer disease)    PVD (peripheral vascular disease) ([place])    Past Surgical History:  Procedure Laterality Date   ABDOMINAL HYSTERECTOMY  1985   BIOPSY  08/30/2022   Procedure: BIOPSY;  Surgeon: Yetta Flock, MD;  Location: [place];  Service:  Gastroenterology;;   BREAST ENHANCEMENT [place]   CATARACT EXTRACTION Bilateral 2014   [facility]   COLONOSCOPY WITH PROPOFOL N/A 08/30/2022   Procedure: COLONOSCOPY WITH PROPOFOL;  Surgeon: Yetta Flock, MD;  Location: [place];  Service: Gastroenterology;  Laterality: N/A;   LAPAROSCOPIC PARTIAL NEPHRECTOMY Left April '13   [facility], Dr. Alinda Money   LAPAROTOMY N/A 07/05/2022   Procedure: EXPLORATORY LAPAROTOMY;  Surgeon: Ileana Roup, MD;  [facility];  Service: General;  Laterality: N/A;   LUMBAR FUSION  03/2003   L4-5   [place]    Social History:  reports that she has never smoked. She has never used smokeless tobacco. She reports that she does not currently use alcohol after a past usage of about 7.0 standard drinks of alcohol per week. She reports that she does not use drugs.   Allergies  Allergen Reactions   Morphine And Related Itching and Rash    Given in IV.    Family History  Problem Relation Age of Onset   Heart disease Mother    COPD Mother    Thyroid disease Mother    Diabetes Father    Heart disease Father        CABG   Stroke Father    Parkinson's disease Father    Heart disease Brother    Stroke Brother  Hyperlipidemia Brother    Diabetes Daughter    Hearing loss Brother    Crohn's disease Other        neice   Hypertension Son    Diabetes Son    Melanoma Son    Colon cancer Neg Hx    Stomach cancer Neg Hx    Esophageal cancer Neg Hx      Prior to Admission medications   Medication Sig Start Date End Date Taking? Authorizing Provider  acetaminophen (TYLENOL) 325 MG tablet Take 650 mg by mouth 2 (two) times daily as needed for mild pain or headache.   Yes [provider]  ALPRAZolam (XANAX) 0.5 MG tablet Take 1 tablet (0.5 mg total) by mouth 2 (two) times daily as needed. for anxiety Patient taking differently: Take 0.5 mg by mouth at bedtime. for anxiety 07/16/22  Yes Charlynne Cousins, MD  Calcium Carbonate Antacid (TUMS PO) Take 2 tablets by mouth as needed (reflux.).   Yes [provider]  Carboxymethylcellulose Sodium (EYE DROPS OP) Place 2 drops into both eyes in the morning and at bedtime.   Yes [provider]  Cyanocobalamin (VITAMIN B-12 PO) Take 1 capsule by mouth daily.   Yes [provider]  DULoxetine (CYMBALTA) 60 MG capsule Take 60 mg by mouth daily.   Yes [provider]  FIBER PO Take 1 tablet by mouth daily.   Yes [provider]  furosemide (LASIX) 40 MG tablet Take 40 mg by mouth daily.   Yes [provider]  levothyroxine (SYNTHROID) 88 MCG tablet Take 88 mcg by mouth every morning. 09/13/22  Yes [provider]  mesalamine (LIALDA) 1.2 g EC tablet Take 2 tablets (2.4 g total) by mouth daily with breakfast. 10/23/22  Yes Armbruster, Carlota Raspberry, MD  metoprolol succinate (TOPROL-XL) 25 MG 24 hr tablet Take 25 mg by mouth daily.   Yes [provider]  Multiple Vitamins-Minerals (MULTIVITAMIN WITH MINERALS) tablet Take 1 tablet by mouth daily.   Yes [provider]  omeprazole (PRILOSEC) 40 MG capsule Take 1 capsule (40 mg total) by mouth daily. NEED ANNUAL VISIT FOR FURTHER REFILLS Patient taking differently: Take 40 mg by mouth daily. 04/29/17  Yes Hoyt Koch, MD  ondansetron (ZOFRAN) 4 MG tablet Take 1 tablet (4 mg total) by mouth every 6 (six) hours as needed for nausea. 10/19/22  Yes Danford, Suann Larry, MD  rosuvastatin (CRESTOR) 10 MG tablet Take 10 mg by mouth every evening.   Yes [provider]    Physical Exam: BP (!) 96/47   Pulse 68   Temp (!) 97.4 F (36.3 C) (Oral)   Resp 17   Ht 5\' 2"  (1.575 m)   Wt 59 kg   SpO2 97%   BMI 23.78 kg/m   General:  Alert, oriented, calm, in no acute distress  Eyes: EOMI, clear conjuctivae, white sclerea Neck: supple, no masses, trachea mildline  Cardiovascular: RRR, no murmurs or rubs, no peripheral  edema  Respiratory: clear to auscultation bilaterally, no wheezes, no crackles  Abdomen: soft, nontender, nondistended, normal bowel tones heard  Skin: dry, no rashes  Musculoskeletal: no joint effusions, normal range of motion  Psychiatric: appropriate affect, normal speech  Neurologic: extraocular muscles intact, clear speech, moving all extremities with intact sensorium          Labs on Admission:  Basic Metabolic Panel: Recent Labs  Lab 11/10/22 0816  NA 131*  K 3.1*  CL 101  CO2 22  GLUCOSE 120*  BUN 11  CREATININE 0.61  CALCIUM 7.8*   Liver Function Tests: Recent Labs  Lab 11/10/22 0816  AST 19  ALT 17  ALKPHOS 45  BILITOT 0.4  PROT 5.5*  ALBUMIN 2.8*   No results for input(s): "LIPASE", "AMYLASE" in the last 168 hours. No results for input(s): "AMMONIA" in the last 168 hours. CBC: Recent Labs  Lab 11/10/22 0816  WBC 6.1  HGB 7.9*  HCT 25.4*  MCV 99.2  PLT 301   Cardiac Enzymes: No results for input(s): "CKTOTAL", "CKMB", "CKMBINDEX", "TROPONINI" in the last 168 hours.  BNP (last 3 results) Recent Labs    11/10/22 0911  BNP 303.1*    ProBNP (last 3 results) No results for input(s): "PROBNP" in the last 8760 hours.  CBG: Recent Labs  Lab 11/10/22 0902  GLUCAP 111*    Radiological Exams on Admission: ECHO 07/06/2022  1. Left ventricular ejection fraction, by estimation, is 50%. Left  ventricular ejection fraction by 2D MOD biplane is 45.8 %. The left  ventricle has low normal function. The left ventricle has no regional wall  motion abnormalities. Left ventricular diastolic parameters are consistent with Grade I diastolic dysfunction  (impaired relaxation).   2. Right ventricular systolic function is normal. The right ventricular  size is normal.   3. The mitral valve is grossly normal. Trivial mitral valve regurgitation.   4. The aortic valve is calcified. Aortic valve regurgitation is not  visualized. Aortic valve sclerosis is present, with  no evidence of aortic valve stenosis.   DG Chest Portable 1 View  Result Date: 11/10/2022 CLINICAL DATA:  82 year old female with history of hypotension. EXAM: PORTABLE CHEST 1 VIEW COMPARISON:  Chest x-ray 10/17/2022. FINDINGS: Lung volumes are normal. No consolidative airspace disease. No pleural effusions. No pneumothorax. No pulmonary nodule or mass noted. Pulmonary vasculature and the cardiomediastinal silhouette are within normal limits. IMPRESSION: No radiographic evidence of acute cardiopulmonary disease. Electronically Signed   By: Vinnie Langton M.D.   On: 11/10/2022 09:40    Assessment/Plan Principal Problem:   Hypotension (arterial)-likely due to volume depletion from recent diarrhea/loose stools, in the setting of continued Lasix use.  She was also seen by her cardiologist today, who agrees hypotension is likely secondary to dehydration. - inpatient admit to stepdown due to continued hypotension - hold lasix and lisinopril, and continue fluid resuscitation - cardiology recommends OK for Toprol XL if needed for tachycardia, will not restart at this time - this could also be the cause of her fatigue and syncope, cardiology working on event monitor - check TSH Active Problems:   Fibromyalgia   Essential hypertension - holding meds as above   Hypothyroidism - continue synthroid   GERD (gastroesophageal reflux disease) - continue PPI   Anxiety with depression   Hypovolemia - getting fluids as above   Chronic diarrhea - she has intermittent diarrhea and recent Cdiff infection for which she completed a course of treatment. Currently I don't suspect recurrent Cdiff infection   Peripheral edema  DVT prophylaxis: Lovenox     Code Status: DNR  Consults called: None  Admission status: Inpatient to [facility] unit.   Time spent: 56 minutes  Izacc Demeyer Neva Seat MD [facility] Pager [phone]  If 7PM-7AM, please contact night-coverage [email or web address] Password  [facility]  11/10/2022, 3:55 PM

## 2022-11-11 ENCOUNTER — Ambulatory Visit: Payer: Medicare Other | Admitting: Internal Medicine

## 2022-11-11 ENCOUNTER — Inpatient Hospital Stay (HOSPITAL_COMMUNITY): Payer: Medicare Other

## 2022-11-11 DIAGNOSIS — E861 Hypovolemia: Secondary | ICD-10-CM | POA: Diagnosis not present

## 2022-11-11 LAB — CBC
HCT: 23.7 % — ABNORMAL LOW (ref 36.0–46.0)
Hemoglobin: 7.3 g/dL — ABNORMAL LOW (ref 12.0–15.0)
MCH: 31.1 pg (ref 26.0–34.0)
MCHC: 30.8 g/dL (ref 30.0–36.0)
MCV: 100.9 fL — ABNORMAL HIGH (ref 80.0–100.0)
Platelets: 321 10*3/uL (ref 150–400)
RBC: 2.35 MIL/uL — ABNORMAL LOW (ref 3.87–5.11)
RDW: 14.6 % (ref 11.5–15.5)
WBC: 5.9 10*3/uL (ref 4.0–10.5)
nRBC: 0 % (ref 0.0–0.2)

## 2022-11-11 LAB — ECHOCARDIOGRAM COMPLETE
Area-P 1/2: 4.6 cm2
Height: 62 in
S' Lateral: 3.2 cm
Weight: 2239.87 oz

## 2022-11-11 LAB — BASIC METABOLIC PANEL
Anion gap: 5 (ref 5–15)
BUN: 8 mg/dL (ref 8–23)
CO2: 22 mmol/L (ref 22–32)
Calcium: 7.6 mg/dL — ABNORMAL LOW (ref 8.9–10.3)
Chloride: 108 mmol/L (ref 98–111)
Creatinine, Ser: 0.63 mg/dL (ref 0.44–1.00)
GFR, Estimated: >60 mL/min (ref >60)
Glucose, Bld: 90 mg/dL (ref 70–99)
Potassium: 3.6 mmol/L (ref 3.5–5.1)
Sodium: 135 mmol/L (ref 135–145)

## 2022-11-11 MED ORDER — FUROSEMIDE 20 MG PO TABS
20.0000 mg | ORAL_TABLET | Freq: Once | ORAL | Status: AC
  Administered 2022-11-11: 20 mg via ORAL
  Filled 2022-11-11: qty 1

## 2022-11-11 NOTE — Telephone Encounter (Signed)
Patient called and said they are temporarily stopping her lasix,lisinopril and metoprolol

## 2022-11-11 NOTE — TOC Initial Note (Signed)
Transition of Care [facility]) - Initial/Assessment Note    Patient Details  Name: [name] MRN: [id number] Date of Birth: [birth year]  Transition of Care [facility]) CM/SW Contact:    Joaquin Courts, RN Phone Number: 11/11/2022, 12:08 PM  Clinical Narrative:                 CM reviewed chart, noted patient is on room air and has PCP.  Outreached to [facility] who report patient is from independent living.  PT/OT eval is pending at this time, TOC will follow for outcomes and potential discharge needs.    Expected Discharge Plan: Home/Self Care Barriers to Discharge: Continued Medical Work up   Patient Goals and CMS Choice Patient states their goals for this hospitalization and ongoing recovery are:: to get better          Expected Discharge Plan and Services   Discharge Planning Services: CM Consult   Living arrangements for the past 2 months: [place]                                      Prior Living Arrangements/Services Living arrangements for the past 2 months: [place] Lives with:: Self Patient language and need for interpreter reviewed:: Yes Do you feel safe going back to the place where you live?: Yes      Need for Family Participation in Patient Care: Yes (Comment) Care giver support system in place?: Yes (comment)   Criminal Activity/Legal Involvement Pertinent to Current Situation/Hospitalization: No - Comment as needed  Activities of Daily Living Home Assistive Devices/Equipment: Wheelchair ADL Screening (condition at time of admission) Patient's cognitive ability adequate to safely complete daily activities?: Yes Is the patient deaf or have difficulty hearing?: No Does the patient have difficulty seeing, even when wearing glasses/contacts?: No Does the patient have difficulty concentrating, remembering, or making decisions?: No Patient able to express need for assistance with ADLs?: Yes Does the patient  have difficulty dressing or bathing?: No Independently performs ADLs?: Yes (appropriate for developmental age) Does the patient have difficulty walking or climbing stairs?: Yes Weakness of Legs: Both Weakness of Arms/Hands: None  Permission Sought/Granted                  Emotional Assessment Appearance:: Appears stated age       Alcohol / Substance Use: Not Applicable Psych Involvement: No (comment)  Admission diagnosis:  Hypotension (arterial) [I95.9] Hypotension, unspecified hypotension type [I95.9] Diarrhea, unspecified type [id number] Diastolic congestive heart failure, unspecified HF chronicity [I50.30] Patient Active Problem List   Diagnosis Date Noted   Hypotension (arterial) 11/10/2022   Peripheral edema 11/10/2022   Hypokalemia 10/17/2022   Inflammatory bowel disease (Crohn's disease) 10/17/2022   COVID-19 virus infection 10/17/2022   Normocytic anemia 10/17/2022   Nausea vomiting and diarrhea 10/17/2022   Chronic diarrhea 08/30/2022   Noninfectious gastroenteritis 08/30/2022   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   Hypoalbuminemia    Diarrhea 08/28/2022   Abnormal gait 07/23/2022   Severe protein-calorie malnutrition 07/10/2022   Hypovolemia 07/05/2022   Atrioventricular block, Mobitz type 1, 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   Dizziness 06/27/2022   Prolonged Q-T interval on ECG 06/27/2022   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   TIA (transient ischemic attack) 04/30/2022   Acute respiratory failure with hypoxia 05/11/2020   Right upper lobe pneumonia 05/11/2020   Hyponatremia 05/11/2020   GERD (gastroesophageal reflux disease)    Anxiety with  depression    Anxiety 11/14/2016   Swelling 06/18/2016   Hypothyroidism 05/17/2016   Abnormal CT scan 12/28/2015   Lumbar radiculopathy 06/13/2015   Chronic low back pain 04/13/2015   Cervical disc disorder with radiculopathy of cervical region 06/20/2014   Bursitis of right shoulder 05/04/2014   Insomnia 04/21/2013   Routine  health maintenance 01/28/2013   Essential hypertension 01/09/2012   Memory loss or impairment 12/02/2010   Fibromyalgia 05/04/2007   PCP:  Reynold Bowen, MD Pharmacy:   [place], [place] [address] Phone: [phone] Fax: [phone]  [facility] [address] [place] [address] Phone: [phone] Fax: [place] [address] Phone: [phone] Fax: [phone]     Social Determinants of Health (SDOH) Social History: SDOH Screenings   Food Insecurity: No Food Insecurity (11/10/2022)  Housing: Low Risk  (11/10/2022)  Transportation Needs: No Transportation Needs (11/10/2022)  Utilities: Not At Risk (11/10/2022)  Tobacco Use: Low Risk  (11/10/2022)   SDOH Interventions:     Readmission Risk Interventions    11/11/2022   12:06 PM  Readmission Risk Prevention Plan  Transportation Screening Complete  Medication Review ([place]) Complete  PCP or Specialist appointment within 3-5 days of discharge Complete  HRI or [place] Complete  SW Recovery Care/Counseling Consult Complete  [place] Not Applicable

## 2022-11-11 NOTE — Progress Notes (Signed)
Patient  has a PMH of GERD with esophageal stricture, PUD, diverticulosis, hypertension, TIA, hypothyroidism , anxiety/depression , fibromyalgia, renal cancer, C-diff and IBD ( probable Crohn's disease) diagnosed in January 2024.   Hospitalized in Jan 2024 with diarrhea / sigmoid colitis . Infectious workup negative.  Flexible sigmoidoscopy >> severe inflammation in the sigmoid colon, descending colon and in the transverse colon. The right colon was not examined.   A. COLON, TRANSVERSE, BIOPSY:  Ulcer with granulation tissue and exudate.  Colonic fragments with focally altered crypt architecture.  Differential diagnosis includes Crohn's.  No granulomas identified.   Treated initially with Steroids then seen in office 09/24/22 and started on Mesalamine. Also treated for severe oral candida, please refer to that office note for details.   In March patient was hospitalized with COVID. During that admission she developed worsening diarrhea / rectal bleeding. CT showed active colitis. She was treated again with a course of steroids. Diarrhea resolved but not the rectal bleeding. She called out office, we ordered C-diff which was positive. Treated with oral Vancomycin x 10 days.  Kamirra completed Vancomycin. She completed steroids several days ago. She is still taking Mesalamine.  She has been having 2-3 formed, non-bloody BMs a day. Sometimes certain foods will give her diarrhea but otherwise she has no problem with loose stools. She has no abdominal pain, N/V. From a GI standpoint she feels fine.   GI will be available if needed. Otherwise she should keep her scheduled follow up appt with Dr. Havery Moros on 4/10 at 3:20 pm

## 2022-11-11 NOTE — Progress Notes (Signed)
PROGRESS NOTE   [name]  [id number] DOB: 08-06-1941 DOA: 11/10/2022 PCP: Reynold Bowen, MD  Brief Narrative:   82 year old white female from [place] independent living known history of hypothyroidism Fibromyalgia Inflammatory bowel disease HTN AV block Mobitz 1 Underlying memory loss with agraphia + apraxia-TIA 05/01/2022 peripheral vascular disease Recovered from [facility] back in 05/2020 07/16/2022 large intestinal ischemia 2/2 infectious colitis status post bowel perforation and ex lap Dr. Dema Severin general surgery Readmit 08/28/2022 with?  Reactive colitis?  Crohn's Recurrent COVID-19 on hospitalization 3/7 through 10/19/2022 completed Paxlovid  Presented on 3/10 to emergency room with fecal occult positive GI was consulted they recommended Preparation H as hemoglobin was stable and stool was brown with no bleeding hemorrhoids on exam per ED and patient was discharged with a prescription of prednisone daily for 7 days because she was nonadherent to this  Eventually was found to have C. difficile on 3/15 started the vancomycin as an outpatient incorrectly-seen in cardiology office ultimately on 3/28 after feeling "tired and listless per phone notes"  Ultimately presented to the emergency room 3/31 weakness and low blood pressure in the setting of continued diuretics, ACE Blood pressures on arrival 70/40 fluid responsive-given 500 cc bolus cardiology consulted   Hospital-Problem based course  Distributive shock on admission secondary to Recent C. difficile 3/15 completing treatment about 10 days [May be more?] Underlying Crohn's disease on Lialda AKI-continued antihypertensives and Lasix Overall seems much improved not having any diarrhea whatsoever now-she is still orthostatic and we will need to continue fluids which I will increase to rate of NS 125 cc/H Will need to be monitored on stepdown and attempt to keep MAP above 65 She is coherent and looks better than her prior  assessment according to HPI I suspect with resolution of the diarrhea she could possibly discharge as early as tomorrow  Near fall episode per patient but 2 falls apparently in the past week at her independent living I am not sure if she actually fell or not but she will need to mobilize prior to going back to her assisted living I will ask therapy to assist in evaluation and give recommendations  Mobitz type I AV block Underlying HFpEF EF 50% compensated Follow echocardiogram, get compression stockings keep on monitors to rule out occult arrhythmia Lisinopril Lasix Toprol-XL 12.5 have been held and should be resumed in the outpatient-Lasix probably in 1 week and that to maybe every other day--- we will decide with cardiology's oversight in 1 week at their office  Recent COVID Outside of window of need for treatment do not test and no need for isolation  Crohn's disease Continue Lialda 2.4 mg every morning-no further diarrhea  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 Carron Brazen at the bedside Disposition:  Status is: Inpatient Remains inpatient appropriate because:   Needs resolution of hypotension needs therapy eval needs echo    Subjective: Pleasant coherent intermittently seems confused at the bedside No chest pain No diarrhea No fever  Objective: Vitals:   11/11/22 0400 11/11/22 0500 11/11/22 0600 11/11/22 0700  BP: (!) 89/67 (!) 97/45 (!) 121/52 (!) 113/53  Pulse: 87 78 89 77  Resp: 19 18 (!) 24 18  Temp:      TempSrc:      SpO2: 92% 92% 92% 92%  Weight:      Height:        Intake/Output Summary (Last 24 hours) at 11/11/2022  0740 Last data filed at 11/11/2022 0500 Gross per 24 hour  Intake 2515 ml  Output --  Net 2515 ml   Filed Weights   11/10/22 0806 11/10/22 1717  Weight: 59 kg 63.5 kg    Examination:  EOMI NCAT looks about stated age no icterus no pallor S1-S2 no murmur seems sinus-sinus tach below 120 on monitors Abdomen is soft no rebound no discomfort Moving 4 limbs equally without gross deficit, smile is symmetric No lower extremity edema Relatively flat affect  Data Reviewed: personally reviewed   CBC    Component Value Date/Time   WBC 5.9 11/11/2022 0306   RBC 2.35 (L) 11/11/2022 0306   HGB 7.3 (L) 11/11/2022 0306   HCT 23.7 (L) 11/11/2022 0306   PLT 321 11/11/2022 0306   MCV 100.9 (H) 11/11/2022 0306   MCH 31.1 11/11/2022 0306   MCHC 30.8 11/11/2022 0306   RDW 14.6 11/11/2022 0306   LYMPHSABS 2.9 10/17/2022 1132   MONOABS 0.5 10/17/2022 1132   EOSABS 0.0 10/17/2022 1132   BASOSABS 0.0 10/17/2022 1132      Latest Ref Rng & Units 11/11/2022    3:06 AM 11/10/2022    6:57 PM 11/10/2022    8:16 AM  CMP  Glucose 70 - 99 mg/dL 90   120   BUN 8 - 23 mg/dL 8   11   Creatinine 0.44 - 1.00 mg/dL 0.63  0.66  0.61   Sodium 135 - 145 mmol/L 135   131   Potassium 3.5 - 5.1 mmol/L 3.6   3.1   Chloride 98 - 111 mmol/L 108   101   CO2 22 - 32 mmol/L 22   22   Calcium 8.9 - 10.3 mg/dL 7.6   7.8   Total Protein 6.5 - 8.1 g/dL   5.5   Total Bilirubin 0.3 - 1.2 mg/dL   0.4   Alkaline Phos 38 - 126 U/L   45   AST 15 - 41 U/L   19   ALT 0 - 44 U/L   17      Radiology Studies: DG Chest Portable 1 View  Result Date: 11/10/2022 CLINICAL DATA:  82 year old female with history of hypotension. EXAM: PORTABLE CHEST 1 VIEW COMPARISON:  Chest x-ray 10/17/2022. FINDINGS: Lung volumes are normal. No consolidative airspace disease. No pleural effusions. No pneumothorax. No pulmonary nodule or mass noted. Pulmonary vasculature and the cardiomediastinal silhouette are within normal limits. IMPRESSION: No  radiographic evidence of acute cardiopulmonary disease. Electronically Signed   By: Vinnie Langton M.D.   On: 11/10/2022 09:40     Scheduled Meds:  ALPRAZolam  0.5 mg Oral QHS   Chlorhexidine Gluconate Cloth  6 each Topical Daily   DULoxetine  60 mg Oral Daily   enoxaparin (LOVENOX) injection  40 mg Subcutaneous Q24H   levothyroxine  88 mcg Oral Q0600   mesalamine  2.4 g Oral Q breakfast   pantoprazole  40 mg Oral Daily   polycarbophil  625 mg Oral Daily   rosuvastatin  10 mg Oral QPM   Continuous Infusions:  sodium chloride 75 mL/hr at 11/11/22 0615     LOS: 1 day   Time spent: [place], MD [facility] To contact the attending provider between  7A-7P or the covering provider during after hours 7P-7A, please log into the web site [email or web address] and access using universal [place] password for that web site. If you do not have the password, please call the hospital operator.  11/11/2022, 7:40 AM

## 2022-11-11 NOTE — Telephone Encounter (Signed)
I know that's fine

## 2022-11-11 NOTE — Progress Notes (Signed)
2D echo attempted, patient not available 1125. Will try later

## 2022-11-11 NOTE — Progress Notes (Signed)
  Echocardiogram 2D Echocardiogram has been performed.  [name] 11/11/2022, 1:28 PM

## 2022-11-12 ENCOUNTER — Ambulatory Visit: Payer: Medicare Other | Admitting: Internal Medicine

## 2022-11-12 DIAGNOSIS — E861 Hypovolemia: Secondary | ICD-10-CM | POA: Diagnosis not present

## 2022-11-12 LAB — MAGNESIUM: Magnesium: 1.7 mg/dL (ref 1.7–2.4)

## 2022-11-12 LAB — VITAMIN B12: Vitamin B-12: 1418 pg/mL — ABNORMAL HIGH (ref 180–914)

## 2022-11-12 LAB — CBC
HCT: 27.2 % — ABNORMAL LOW (ref 36.0–46.0)
Hemoglobin: 8.5 g/dL — ABNORMAL LOW (ref 12.0–15.0)
MCH: 30.6 pg (ref 26.0–34.0)
MCHC: 31.3 g/dL (ref 30.0–36.0)
MCV: 97.8 fL (ref 80.0–100.0)
Platelets: 360 10*3/uL (ref 150–400)
RBC: 2.78 MIL/uL — ABNORMAL LOW (ref 3.87–5.11)
RDW: 14.2 % (ref 11.5–15.5)
WBC: 7.6 10*3/uL (ref 4.0–10.5)
nRBC: 0 % (ref 0.0–0.2)

## 2022-11-12 LAB — COMPREHENSIVE METABOLIC PANEL
ALT: 14 U/L (ref 0–44)
AST: 15 U/L (ref 15–41)
Albumin: 2.3 g/dL — ABNORMAL LOW (ref 3.5–5.0)
Alkaline Phosphatase: 51 U/L (ref 38–126)
Anion gap: 9 (ref 5–15)
BUN: 5 mg/dL — ABNORMAL LOW (ref 8–23)
CO2: 20 mmol/L — ABNORMAL LOW (ref 22–32)
Calcium: 7.5 mg/dL — ABNORMAL LOW (ref 8.9–10.3)
Chloride: 100 mmol/L (ref 98–111)
Creatinine, Ser: 0.58 mg/dL (ref 0.44–1.00)
GFR, Estimated: >60 mL/min (ref >60)
Glucose, Bld: 107 mg/dL — ABNORMAL HIGH (ref 70–99)
Potassium: 2.9 mmol/L — ABNORMAL LOW (ref 3.5–5.1)
Sodium: 129 mmol/L — ABNORMAL LOW (ref 135–145)
Total Bilirubin: 0.5 mg/dL (ref 0.3–1.2)
Total Protein: 5.1 g/dL — ABNORMAL LOW (ref 6.5–8.1)

## 2022-11-12 LAB — FERRITIN: Ferritin: 90 ng/mL (ref 11–307)

## 2022-11-12 LAB — RETICULOCYTES
Immature Retic Fract: 28.2 % — ABNORMAL HIGH (ref 2.3–15.9)
RBC.: 3.13 MIL/uL — ABNORMAL LOW (ref 3.87–5.11)
Retic Count, Absolute: 119.3 10*3/uL (ref 19.0–186.0)
Retic Ct Pct: 3.8 % — ABNORMAL HIGH (ref 0.4–3.1)

## 2022-11-12 LAB — IRON AND TIBC
Iron: 11 ug/dL — ABNORMAL LOW (ref 28–170)
Saturation Ratios: 5 % — ABNORMAL LOW (ref 10.4–31.8)
TIBC: 241 ug/dL — ABNORMAL LOW (ref 250–450)
UIBC: 230 ug/dL

## 2022-11-12 LAB — FOLATE: Folate: 33.1 ng/mL (ref >5.9)

## 2022-11-12 MED ORDER — MAGNESIUM OXIDE -MG SUPPLEMENT 400 (240 MG) MG PO TABS
400.0000 mg | ORAL_TABLET | Freq: Two times a day (BID) | ORAL | Status: DC
  Administered 2022-11-12 – 2022-11-15 (×7): 400 mg via ORAL
  Filled 2022-11-12 (×7): qty 1

## 2022-11-12 MED ORDER — POTASSIUM CHLORIDE CRYS ER 20 MEQ PO TBCR
40.0000 meq | EXTENDED_RELEASE_TABLET | Freq: Two times a day (BID) | ORAL | Status: DC
  Administered 2022-11-12 – 2022-11-15 (×7): 40 meq via ORAL
  Filled 2022-11-12 (×7): qty 2

## 2022-11-12 MED ORDER — METOPROLOL SUCCINATE ER 25 MG PO TB24
12.5000 mg | ORAL_TABLET | Freq: Every day | ORAL | Status: DC
  Administered 2022-11-12: 12.5 mg via ORAL
  Filled 2022-11-12 (×2): qty 1

## 2022-11-12 NOTE — Progress Notes (Signed)
Physical Therapy Treatment Patient Details Name: [name] MRN: [id number] DOB: May 31, 1941 Today's Date: 11/12/2022   History of Present Illness [name] is a 82 y.o. female with medical history significant for essential hypertension, inflammatory bowel disease, hypothyroidism, fibromyalgia, recent hospitalization for syncope, ex lap and ex lap and part of her intestine was removed.  After this, recovery was complicated by C. difficile  recently had COVID.To ER3/31/24  for evaluation of low BP, weak, lethargic and some syncope but denies any falls.    PT Comments    The patient  reports feeling tired. Assisted to ambulate in room with RW, on 2 LPM , SPO2 95%, HR111. Patient  assisted back into bed.   Recommendations for follow up therapy are one component of a multi-disciplinary discharge planning process, led by the attending physician.  Recommendations may be updated based on patient status, additional functional criteria and insurance authorization.  Follow Up Recommendations       Assistance Recommended at Discharge Frequent or constant Supervision/Assistance  Patient can return home with the following A little help with walking and/or transfers;A little help with bathing/dressing/bathroom;Assistance with cooking/housework;Help with stairs or ramp for entrance;Assist for transportation   Equipment Recommendations  None recommended by PT    Recommendations for Other Services       Precautions / Restrictions Precautions Precautions: Fall Precaution Comments: monitor BP. HR     Mobility  Bed Mobility Overal bed mobility: Needs Assistance Bed Mobility: Sit to Supine     Supine to sit: Min guard, HOB elevated Sit to supine: Supervision   General bed mobility comments: extra time    Transfers Overall transfer level: Needs assistance Equipment used: Rolling walker (2 wheels) Transfers: Sit to/from Stand Sit to Stand: Mod assist           General transfer  comment: from recliner, feet sliding so blocked the feet, tends to lean posteriorly    Ambulation/Gait Ambulation/Gait assistance: Min assist Gait Distance (Feet): 15 Feet Assistive device: Rolling walker (2 wheels) Gait Pattern/deviations: Step-to pattern, Step-through pattern, Shuffle Gait velocity: decr     General Gait Details: assist to manage around objects   Stairs             Wheelchair Mobility    Modified Rankin (Stroke Patients Only)       Balance Overall balance assessment: Needs assistance Sitting-balance support: No upper extremity supported, Feet supported Sitting balance-Leahy Scale: Fair     Standing balance support: Bilateral upper extremity supported, During functional activity, Reliant on assistive device for balance Standing balance-Leahy Scale: Poor Standing balance comment: posterior lean initially, then gains balance                            Cognition Arousal/Alertness: Awake/alert Behavior During Therapy: WFL for tasks assessed/performed Overall Cognitive Status: Within Functional Limits for tasks assessed                                          Exercises      General Comments        Pertinent Vitals/Pain Pain Assessment Pain Assessment: No/denies pain    Home Living Family/patient expects to be discharged to:: Private residence   Available Help at Discharge: Available 24 hours/day Type of Home: Independent living facility Home Access: Level entry       Home  Layout: One level Home Equipment: Wheelchair - Publishing copy (2 wheels);Cane - single point;Grab bars - tub/shower      Prior Function            PT Goals (current goals can now be found in the care plan section) Acute Rehab PT Goals Patient Stated Goal: to return to apt and hire more caregiviers PT Goal Formulation: With patient Time For Goal Achievement: 11/26/22 Potential to Achieve Goals: Good Progress towards PT  goals: Progressing toward goals    Frequency    Min 3X/week      PT Plan Current plan remains appropriate    Co-evaluation              AM-PAC PT "6 Clicks" Mobility   Outcome Measure  Help needed turning from your back to your side while in a flat bed without using bedrails?: A Little Help needed moving from lying on your back to sitting on the side of a flat bed without using bedrails?: A Little Help needed moving to and from a bed to a chair (including a wheelchair)?: A Little Help needed standing up from a chair using your arms (e.g., wheelchair or bedside chair)?: A Lot Help needed to walk in hospital room?: Total Help needed climbing 3-5 steps with a railing? : Total 6 Click Score: 13    End of Session Equipment Utilized During Treatment: Gait belt Activity Tolerance: Patient tolerated treatment well;Patient limited by fatigue Patient left: with call bell/phone within reach;in bed;with bed alarm set Nurse Communication: Mobility status PT Visit Diagnosis: Unsteadiness on feet (R26.81);Difficulty in walking, not elsewhere classified (R26.2)     Time: [id number] PT Time Calculation (min) (ACUTE ONLY): 16 min  Charges:  $Gait Training: 8-22 mins                  [place] Office [phone] Weekend pager-[phone]    Claretha Cooper 11/12/2022, 12:35 PM

## 2022-11-12 NOTE — Evaluation (Signed)
Patient Details Name: [name] MRN: [id number] DOB: [birth year] Today's Date: 11/12/2022  History of Present Illness  [name] is a 82 y.o. female with medical history significant for essential hypertension, inflammatory bowel disease, hypothyroidism, fibromyalgia, recent hospitalization for syncope, ex lap and ex lap and part of her intestine was removed.  After this, recovery was complicated by C. difficile  recently had COVID.[name]  for evaluation of low BP, weak, lethargic and some syncope but denies any falls.  Clinical Impression  Pt admitted with above diagnosis.  Pt currently with functional limitations due [name] the deficits listed below (see PT Problem List). Pt will benefit from acute skilled PT [name] increase their independence and safety with mobility [name] allow discharge.     The patient is eager [name] get up and ambulate. Patient reports recently that she has been mod I in apartment with [facility] or rollator.. She relates that she will return  [name] the apartment at [facility] with increased support ,which she did at last admission recently.   Patient does  demonstrate mild posterior balance loss. She uses a WC or rollator  at baseline.  HR 95-115 with activity., No reports of dizziness.Patient maintainedon RA- SPO2 >88% while  ambulating and in BR for toileting. BP stable   Continue PT.       Recommendations for follow up therapy are one component of a multi-disciplinary discharge planning process, led by the attending physician.  Recommendations may be updated based on patient status, additional functional criteria and insurance authorization.  Follow Up Recommendations       Assistance Recommended at Discharge Frequent or constant Supervision/Assistance  Patient can return home with the following  A little help with walking and/or transfers;A little help with bathing/dressing/bathroom;Assistance with cooking/housework;Help with stairs or ramp for entrance;Assist for  transportation    Equipment Recommendations None recommended by PT  Recommendations for Other Services       Functional Status Assessment Patient has had a recent decline in their functional status and demonstrates the ability [name] make significant improvements in function in a reasonable and predictable amount of time.     Precautions / Restrictions Precautions Precautions: Fall Precaution Comments: monitor BP. HR Restrictions Weight Bearing Restrictions: No      Mobility  Bed Mobility Overal bed mobility: Needs Assistance Bed Mobility: Supine [name] Sit     Supine [name] sit: Min guard, HOB elevated     General bed mobility comments: extra time    Transfers Overall transfer level: Needs assistance Equipment used: Rolling walker (2 wheels) Transfers: Sit [name]/from Stand Sit [name] Stand: Mod assist           General transfer comment: frombed, feet sliding, tends [name] lean posteriorly    Ambulation/Gait Ambulation/Gait assistance: Min assist Gait Distance (Feet): 15 Feet (x 2) Assistive device: Rolling walker (2 wheels) Gait Pattern/deviations: Step-[name] pattern, Step-through pattern, Shuffle Gait velocity: decr     General Gait Details: assist [name] manage around objects  Stairs            Wheelchair Mobility    Modified Rankin (Stroke Patients Only)       Balance Overall balance assessment: Needs assistance Sitting-balance support: No upper extremity supported, Feet supported Sitting balance-Leahy Scale: Fair     Standing balance support: Bilateral upper extremity supported, During functional activity, Reliant on assistive device for balance Standing balance-Leahy Scale: Poor  Pertinent Vitals/Pain Pain Assessment Pain Assessment: No/denies pain    Home Living Family/patient expects [name] be discharged [name]:: Private residence   Available Help at Discharge: Available 24 hours/day Type of Home: Independent living  facility Home Access: Level entry       Home Layout: One level Home Equipment: [place] (2 wheels);Cane - single point;Grab bars - tub/shower      Prior Function               Mobility Comments: transfers [name] a w/c mod ind--stand pivot, most recently uses rollator ADLs Comments: has been independnet in apartment, plans [name] have 24/7 caregivers     Hand Dominance   Dominant Hand: Right    Extremity/Trunk Assessment   Upper Extremity Assessment Upper Extremity Assessment: Overall WFL for tasks assessed    Lower Extremity Assessment Lower Extremity Assessment: Generalized weakness    Cervical / Trunk Assessment Cervical / Trunk Assessment: Kyphotic  Communication   Communication: No difficulties  Cognition Arousal/Alertness: Awake/alert Behavior During Therapy: WFL for tasks assessed/performed Overall Cognitive Status: Within Functional Limits for tasks assessed                                          General Comments      Exercises     Assessment/Plan    PT Assessment Patient needs continued PT services  PT Problem List Decreased strength;Decreased activity tolerance;Decreased mobility;Decreased safety awareness;Decreased balance       PT Treatment Interventions DME instruction;Therapeutic activities;Balance training;Gait training;Functional mobility training;Therapeutic exercise;Patient/family education    PT Goals (Current goals can be found in the Care Plan section)  Acute Rehab PT Goals [name] PT Goal Formulation: With patient Time For Goal Achievement: 11/26/22 Potential [name] Achieve Goals: Good    Frequency Min 3X/week     Co-evaluation               AM-PAC PT "6 Clicks" Mobility  Outcome Measure Help needed turning from your back [name] your side while in a flat bed without using bedrails?: A Little Help needed moving from lying on your  back [name] sitting on the side of a flat bed without using bedrails?: A Little Help needed moving [name] and from a bed [name] a chair (including a wheelchair)?: A Lot Help needed standing up from a chair using your arms (e.g., wheelchair or bedside chair)?: A Lot Help needed [name] walk in hospital room?: Total Help needed climbing 3-5 steps with a railing? : Total 6 Click Score: 12    End of Session Equipment Utilized During Treatment: Gait belt Activity Tolerance: Patient tolerated treatment well;Patient limited by fatigue Patient left: in chair;with call bell/phone within reach;with nursing/sitter in room Nurse Communication: Mobility status PT Visit Diagnosis: Unsteadiness on feet (R26.81);Difficulty in walking, not elsewhere classified (R26.2)    Time: [id number] PT Time Calculation (min) (ACUTE ONLY): 31 min   Charges:   PT Evaluation $PT Eval Low Complexity: 1 Low PT Treatments $Gait Training: 8-22 mins         [place] Office [phone] Weekend [phone]  Claretha Cooper 11/12/2022, 11:11 AM

## 2022-11-12 NOTE — Progress Notes (Signed)
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 Dema Severin general surgery Readmit 08/28/2022 with?  Reactive colitis?  Crohn's Recurrent COVID-19 on hospitalization 3/7 through 10/19/2022 completed Paxlovid  Presented on 3/10 to emergency room with fecal occult positive GI was consulted they recommended Preparation H as hemoglobin was stable and stool was brown with no bleeding hemorrhoids on exam per ED and patient was discharged with a prescription of prednisone daily for 7 days because she was nonadherent to this  Eventually found to have C. difficile on 3/15 --rx vancomycin as an outpatient incorrectly -[patient apparently was taking it incorrectly ] seen in cardiology office ultimately on 3/28 after feeling "tired and listless "  Ultimately presented to the emergency room 3/31 weakness and low blood pressure in the setting of continued diuretics, ACE Blood pressures on arrival 70/40 fluid responsive-given 500 cc bolus cardiology consulted  11/11/2022 echo shows EF 55-60% grade 1 DD no acute component on exam  Hospital-Problem based course  Distributive shock on admission secondary to 1]Recent C. difficile 3/15 completing treatment about 10 days [May be more?] 2]Underlying Crohn's disease with diarrhea prior to admission on Lialda 3] AKI-2/2 continued antihypertensives and Lasix in the setting of diarrhea Overall improved-became plethoric with fluids so they were stopped late evening 4/1--given Lasix 20 and would hold Lasix at this time keep  volumes even at this time-May reevaluate in a.m. for either q. 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 EF 50% compensated compression stockings  Resume toprol XL lower dose 12.5--had ~5-7 PVC this am   Normocytic anemia in the setting of recent diarrheal illness Hold on further workup at this time  iron studies added on  Recent COVID Outside of window of need for treatment do not test and no need for isolation  Crohn's disease/recent fully Rx Cdiff Continue Lialda 2.4 mg every morning- 2 episodes diarr 4/1 in pm--not infectious  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 Carron Brazen on fone Disposition:  Status is: Inpatient Remains inpatient appropriate because:   Needs resolution of hypotension needs therapy eval needs echo    Subjective:  Awake coherent pleasant sitting up does not really desat off of oxygen Nursing aware to give I-S She is not having any stool at this time She has no chest pain She is eating--appreciate of therapy input  Objective: Vitals:   11/12/22 0500  11/12/22 0600 11/12/22 0700 11/12/22 0800  BP: (!) 118/100 (!) 104/49 118/81 (!) 136/56  Pulse: 100 95 (!) 101  (!) 106  Resp: (!) 25 (!) 24 (!) 22 (!) 29  Temp:    98.1 F (36.7 C)  TempSrc:    Oral  SpO2: 95% 96% 97% 97%  Weight:      Height:        Intake/Output Summary (Last 24 hours) at 11/12/2022 0900 Last data filed at 11/12/2022 0500 Gross per 24 hour  Intake 837.36 ml  Output 801 ml  Net 36.36 ml    Filed Weights   11/10/22 0806 11/10/22 1717  Weight: 59 kg 63.5 kg    Examination:  EOMI NCAT no focal deficit CTAB no added sound wheeze rales or rhonchi Abdomen soft no rebound Power 5/5 A little slow to move however Do not note any significant cognitive impairment-seems to be more coherent and oriented  today  Data Reviewed: personally reviewed   CBC    Component Value Date/Time   WBC 7.6 11/12/2022 0314   RBC 2.78 (L) 11/12/2022 0314   HGB 8.5 (L) 11/12/2022 0314   HCT 27.2 (L) 11/12/2022 0314   PLT 360 11/12/2022 0314   MCV 97.8 11/12/2022 0314   MCH 30.6 11/12/2022 0314   MCHC 31.3 11/12/2022 0314   RDW 14.2 11/12/2022 0314   LYMPHSABS 2.9 10/17/2022 1132   MONOABS 0.5 10/17/2022 1132   EOSABS 0.0 10/17/2022 1132   BASOSABS 0.0 10/17/2022 1132      Latest Ref Rng & Units 11/12/2022    3:14 AM 11/11/2022    3:06 AM 11/10/2022    6:57 PM  CMP  Glucose 70 - 99 mg/dL 107  90    BUN 8 - 23 mg/dL 5  8    Creatinine 0.44 - 1.00 mg/dL 0.58  0.63  0.66   Sodium 135 - 145 mmol/L 129  135    Potassium 3.5 - 5.1 mmol/L 2.9  3.6    Chloride 98 - 111 mmol/L 100  108    CO2 22 - 32 mmol/L 20  22    Calcium 8.9 - 10.3 mg/dL 7.5  7.6    Total Protein 6.5 - 8.1 g/dL 5.1     Total Bilirubin 0.3 - 1.2 mg/dL 0.5     Alkaline Phos 38 - 126 U/L 51     AST 15 - 41 U/L 15     ALT 0 - 44 U/L 14        Radiology Studies: ECHOCARDIOGRAM COMPLETE  Result Date: 11/11/2022    ECHOCARDIOGRAM REPORT   Patient Name:   [name] Date of Exam: 11/11/2022 Medical Rec #:  [id number]       Height:       62.0 in Accession #:    [id number]      Weight:       140.0 lb Date of Birth:   1941/05/01       BSA:          1.643 m Patient Age:    82 years        BP:           114/54 mmHg Patient Gender: F               HR:           91 bpm. Exam Location:  Inpatient Procedure: 2D Echo, Cardiac Doppler and Color Doppler Indications:     R94.31 Abnormal EKG  History:         Patient has prior history of Echocardiogram examinations, most                  recent 07/06/2022. TIA, Signs/Symptoms:Edema, Shortness of                  Breath, Dyspnea and Dizziness/Lightheadedness; Risk                  Factors:Hypertension.  Sonographer:     Roseanna Rainbow RDCS Referring Phys:  [id number] [place] Diagnosing Phys: Collene Mares Custovic IMPRESSIONS  1. Left ventricular ejection fraction, by estimation, is 55 to 60%. Left ventricular ejection fraction by 3D volume is 60 %. The left ventricle has normal function. The left ventricle has no regional wall motion abnormalities. Left ventricular diastolic  parameters are consistent with Grade I diastolic dysfunction (impaired relaxation).  2. Right ventricular systolic function is normal. The right ventricular size is normal. There is moderately elevated pulmonary artery systolic pressure.  3. The mitral valve is grossly normal. Mild mitral valve regurgitation.  4. The aortic valve is calcified. Aortic valve regurgitation is not visualized. Aortic valve sclerosis/calcification is present, without any evidence of aortic stenosis. FINDINGS  Left Ventricle: Left ventricular ejection fraction, by estimation, is 55 to 60%. Left ventricular ejection fraction by 3D volume is 60 %. The left ventricle has normal function. The left ventricle has no regional wall motion abnormalities. The left ventricular internal cavity size was normal in size. There is no left ventricular hypertrophy. Left ventricular diastolic parameters are consistent with Grade I diastolic dysfunction (impaired relaxation). Right Ventricle: The right ventricular size is normal. No increase in right ventricular wall  thickness. Right ventricular systolic function is normal. There is moderately elevated pulmonary artery systolic pressure. The tricuspid regurgitant velocity is 3.18 m/s, and with an assumed right atrial pressure of 15 mmHg, the estimated right ventricular systolic pressure is 123XX123 mmHg. Left Atrium: Left atrial size was normal in size. Right Atrium: Right atrial size was normal in size. Pericardium: There is no evidence of pericardial effusion. Mitral Valve: The mitral valve is grossly normal. Mild mitral valve regurgitation. Tricuspid Valve: The tricuspid valve is grossly normal. Tricuspid valve regurgitation is mild. Aortic Valve: The aortic valve is calcified. Aortic valve regurgitation is not visualized. Aortic valve sclerosis/calcification is present, without any evidence of aortic stenosis. Pulmonic Valve: The pulmonic valve was grossly normal. Pulmonic valve regurgitation is not visualized. Aorta: The aortic root is normal in size and structure. IAS/Shunts: No atrial level shunt detected by color flow Doppler.  LEFT VENTRICLE PLAX 2D LVIDd:         4.60 cm         Diastology LVIDs:         3.20 cm         LV e' medial:   8.05 cm/s LV PW:         0.80 cm         LV E/e' medial: 11.5 LV IVS:        1.30 cm LVOT diam:     2.00 cm LV SV:         67              3D Volume EF LV SV Index:   41              LV 3D EF:    Left LVOT Area:     3.14 cm  ventricul                                             ar                                             ejection                                             fraction                                             by 3D                                             volume is                                             60 %.                                 3D Volume EF:                                3D EF:        60 %                                LV EDV:       94 ml                                LV ESV:       37 ml                                LV SV:         56 ml RIGHT VENTRICLE             IVC RV S prime:     10.20 cm/s  IVC diam: 2.30 cm TAPSE (M-mode): 1.7 cm LEFT ATRIUM             Index        RIGHT ATRIUM          Index LA diam:        2.90 cm 1.77 cm/m   RA Area:     9.33 cm LA Vol (A2C):   60.8 ml 37.01 ml/m  RA Volume:   16.20 ml 9.86 ml/m LA Vol (A4C):   29.2 ml 17.77 ml/m LA Biplane Vol: 44.6 ml 27.15 ml/m  AORTIC VALVE LVOT Vmax:   110.00 cm/s LVOT Vmean:  77.100 cm/s LVOT VTI:    0.214 m  AORTA Ao Root diam: 2.90 cm Ao Asc diam:  3.00 cm MITRAL VALVE               TRICUSPID VALVE MV Area (PHT): 4.60 cm    TR Peak grad:   40.4 mmHg MV Decel Time: 165 msec    TR Vmax:        318.00 cm/s MV E velocity: 92.70 cm/s MV A velocity: 83.20 cm/s  SHUNTS MV E/A ratio:  1.11        Systemic VTI:  0.21 m                            Systemic Diam: 2.00 cm Collene Mares Custovic Electronically signed by Floydene Flock Signature Date/Time: 11/11/2022/2:53:01 PM    Final    DG Chest Portable 1 View  Result Date: 11/10/2022 CLINICAL DATA:  82 year old female with history of hypotension. EXAM: PORTABLE CHEST 1 VIEW COMPARISON:  Chest x-ray 10/17/2022. FINDINGS: Lung volumes are normal. No consolidative airspace disease. No pleural effusions. No pneumothorax. No pulmonary nodule or mass noted. Pulmonary vasculature and the cardiomediastinal silhouette are within normal limits. IMPRESSION: No radiographic evidence of acute cardiopulmonary disease. Electronically Signed   By: Vinnie Langton M.D.   On: 11/10/2022 09:40     Scheduled Meds:  ALPRAZolam  0.5 mg Oral QHS   Chlorhexidine Gluconate Cloth  6 each Topical Daily   DULoxetine  60 mg Oral Daily   enoxaparin (LOVENOX) injection  40 mg Subcutaneous Q24H   levothyroxine  88 mcg Oral Q0600   mesalamine  2.4 g Oral Q breakfast   pantoprazole  40 mg Oral Daily   polycarbophil  625 mg Oral Daily   potassium chloride  40 mEq Oral BID   rosuvastatin  10 mg Oral QPM   Continuous Infusions:     LOS: 2 days    Time spent: Waller, MD [facility] To contact the attending provider between 7A-7P or the covering provider during after hours 7P-7A, please log into the web site [email or web address] and access using universal [place] password for that web site. If you do not have the password, please call the hospital operator.  11/12/2022, 9:00 AM

## 2022-11-13 ENCOUNTER — Inpatient Hospital Stay (HOSPITAL_COMMUNITY): Payer: Medicare Other

## 2022-11-13 DIAGNOSIS — I5030 Unspecified diastolic (congestive) heart failure: Secondary | ICD-10-CM

## 2022-11-13 DIAGNOSIS — I959 Hypotension, unspecified: Secondary | ICD-10-CM | POA: Diagnosis not present

## 2022-11-13 DIAGNOSIS — R197 Diarrhea, unspecified: Secondary | ICD-10-CM | POA: Diagnosis not present

## 2022-11-13 DIAGNOSIS — R509 Fever, unspecified: Secondary | ICD-10-CM

## 2022-11-13 DIAGNOSIS — E861 Hypovolemia: Secondary | ICD-10-CM | POA: Diagnosis not present

## 2022-11-13 LAB — CBC WITH DIFFERENTIAL/PLATELET
Abs Immature Granulocytes: 0.03 10*3/uL (ref 0.00–0.07)
Basophils Absolute: 0.1 10*3/uL (ref 0.0–0.1)
Basophils Relative: 1 %
Eosinophils Absolute: 0.2 10*3/uL (ref 0.0–0.5)
Eosinophils Relative: 3 %
HCT: 32.7 % — ABNORMAL LOW (ref 36.0–46.0)
Hemoglobin: 9.9 g/dL — ABNORMAL LOW (ref 12.0–15.0)
Immature Granulocytes: 0 %
Lymphocytes Relative: 29 %
Lymphs Abs: 2.1 10*3/uL (ref 0.7–4.0)
MCH: 29.6 pg (ref 26.0–34.0)
MCHC: 30.3 g/dL (ref 30.0–36.0)
MCV: 97.9 fL (ref 80.0–100.0)
Monocytes Absolute: 0.7 10*3/uL (ref 0.1–1.0)
Monocytes Relative: 9 %
Neutro Abs: 4.1 10*3/uL (ref 1.7–7.7)
Neutrophils Relative %: 58 %
Platelets: 439 10*3/uL — ABNORMAL HIGH (ref 150–400)
RBC: 3.34 MIL/uL — ABNORMAL LOW (ref 3.87–5.11)
RDW: 14.2 % (ref 11.5–15.5)
WBC: 7.1 10*3/uL (ref 4.0–10.5)
nRBC: 0 % (ref 0.0–0.2)

## 2022-11-13 LAB — CBC
HCT: 27.1 % — ABNORMAL LOW (ref 36.0–46.0)
Hemoglobin: 8.6 g/dL — ABNORMAL LOW (ref 12.0–15.0)
MCH: 30.5 pg (ref 26.0–34.0)
MCHC: 31.7 g/dL (ref 30.0–36.0)
MCV: 96.1 fL (ref 80.0–100.0)
Platelets: 366 10*3/uL (ref 150–400)
RBC: 2.82 MIL/uL — ABNORMAL LOW (ref 3.87–5.11)
RDW: 14.2 % (ref 11.5–15.5)
WBC: 5.4 10*3/uL (ref 4.0–10.5)
nRBC: 0 % (ref 0.0–0.2)

## 2022-11-13 LAB — COMPREHENSIVE METABOLIC PANEL
ALT: 12 U/L (ref 0–44)
AST: 13 U/L — ABNORMAL LOW (ref 15–41)
Albumin: 2.2 g/dL — ABNORMAL LOW (ref 3.5–5.0)
Alkaline Phosphatase: 45 U/L (ref 38–126)
Anion gap: 6 (ref 5–15)
BUN: 8 mg/dL (ref 8–23)
CO2: 23 mmol/L (ref 22–32)
Calcium: 7.7 mg/dL — ABNORMAL LOW (ref 8.9–10.3)
Chloride: 102 mmol/L (ref 98–111)
Creatinine, Ser: 0.51 mg/dL (ref 0.44–1.00)
GFR, Estimated: >60 mL/min (ref >60)
Glucose, Bld: 101 mg/dL — ABNORMAL HIGH (ref 70–99)
Potassium: 4.1 mmol/L (ref 3.5–5.1)
Sodium: 131 mmol/L — ABNORMAL LOW (ref 135–145)
Total Bilirubin: 0.3 mg/dL (ref 0.3–1.2)
Total Protein: 5 g/dL — ABNORMAL LOW (ref 6.5–8.1)

## 2022-11-13 LAB — MAGNESIUM: Magnesium: 2.1 mg/dL (ref 1.7–2.4)

## 2022-11-13 MED ORDER — METOPROLOL SUCCINATE ER 25 MG PO TB24
25.0000 mg | ORAL_TABLET | Freq: Every day | ORAL | Status: DC
  Administered 2022-11-13 – 2022-11-15 (×3): 25 mg via ORAL
  Filled 2022-11-13 (×2): qty 1

## 2022-11-13 MED ORDER — SODIUM CHLORIDE 0.9 % IV SOLN: INTRAVENOUS | Status: AC

## 2022-11-13 NOTE — Progress Notes (Signed)
Physical Therapy Treatment Patient Details Name: [name] MRN: [id number] DOB: 02/15/1941 Today's Date: 11/13/2022   History of Present Illness [name] is a 82 y.o. female with medical history significant for essential hypertension, inflammatory bowel disease, hypothyroidism, fibromyalgia, recent hospitalization for syncope, ex lap and ex lap and part of her intestine was removed.  After this, recovery was complicated by C. difficile  recently had COVID.To ER3/31/24  for evaluation of low BP, weak, lethargic and some syncope but denies any falls.    PT Comments    The  patient reports feeling poorly, recent temp at 102.9. Assisted  patient from [facility] back to bed.  Contiue PT as tolerated.   Recommendations for follow up therapy are one component of a multi-disciplinary discharge planning process, led by the attending physician.  Recommendations may be updated based on patient status, additional functional criteria and insurance authorization.  Follow Up Recommendations       Assistance Recommended at Discharge Frequent or constant Supervision/Assistance  Patient can return home with the following A little help with walking and/or transfers;A little help with bathing/dressing/bathroom;Assistance with cooking/housework;Help with stairs or ramp for entrance;Assist for transportation   Equipment Recommendations  None recommended by PT    Recommendations for Other Services       Precautions / Restrictions Precautions Precautions: Fall Precaution Comments: monitor BP. HR Restrictions Weight Bearing Restrictions: No     Mobility  Bed Mobility   Bed Mobility: Sit to Supine     Supine to sit: Min assist     General bed mobility comments: extra time, assistd legs back into bed    Transfers Overall transfer level: Needs assistance   Transfers: Sit to/from Stand, Bed to chair/wheelchair/BSC Sit to Stand: Mod assist   Step pivot transfers: Mod assist        General transfer comment: assist to stand, use of bed rail,and toilet arm to stnad and pivot to the bed.    Ambulation/Gait               General Gait Details: defer   Stairs             Wheelchair Mobility    Modified Rankin (Stroke Patients Only)       Balance   Sitting-balance support: No upper extremity supported, Feet supported Sitting balance-Leahy Scale: Fair     Standing balance support: Bilateral upper extremity supported, During functional activity, Reliant on assistive device for balance Standing balance-Leahy Scale: Poor                              Cognition Arousal/Alertness: Awake/alert Behavior During Therapy: Anxious, Restless Overall Cognitive Status: Within Functional Limits for tasks assessed                                 General Comments: reports feeling poorly        Exercises      General Comments        Pertinent Vitals/Pain Pain Assessment Pain Assessment: No/denies pain    Home Living                          Prior Function            PT Goals (current goals can now be found in the care plan section) Progress towards PT goals: Progressing  toward goals    Frequency    Min 3X/week      PT Plan Current plan remains appropriate    Co-evaluation              AM-PAC PT "6 Clicks" Mobility   Outcome Measure  Help needed turning from your back to your side while in a flat bed without using bedrails?: A Little Help needed moving from lying on your back to sitting on the side of a flat bed without using bedrails?: A Little Help needed moving to and from a bed to a chair (including a wheelchair)?: A Lot Help needed standing up from a chair using your arms (e.g., wheelchair or bedside chair)?: A Lot Help needed to walk in hospital room?: Total Help needed climbing 3-5 steps with a railing? : Total 6 Click Score: 12    End of Session   Activity Tolerance: Patient  limited by fatigue Patient left: in bed;with call bell/phone within reach;with bed alarm set Nurse Communication: Mobility status PT Visit Diagnosis: Unsteadiness on feet (R26.81);Difficulty in walking, not elsewhere classified (R26.2)     Time: [id number] PT Time Calculation (min) (ACUTE ONLY): 17 min  Charges:  $Therapeutic Activity: 8-22 mins                     Tresa Endo PT Acute Rehabilitation Services Office [phone] Weekend [phone]     Claretha Cooper 11/13/2022, 4:12 PM

## 2022-11-13 NOTE — Progress Notes (Signed)
Subjective:  Patient seen and examined at bedside, she is wrapped in many blankets. She complains of feelings like she is freezing and she has severe chills. She notes her breathing has also been raspy. Patient would like tylenol for discomfort. Denies chest pain, palpitations, diaphoresis, syncope, orthopnea.  Intake/Output from previous day:  I/O last 3 completed shifts: In: 840 [P.O.:840] Out: 1450 [Urine:1450] Total I/O In: 120 [P.O.:120] Out: -  Net IO Since Admission: 3,160.39 mL [11/13/22 1336]  Blood pressure 117/70, pulse 93, temperature 99.2 F (37.3 C), temperature source Oral, resp. rate 18, height 5\' 2"  (1.575 m), weight 63.5 kg, SpO2 92 %. Physical Exam Vitals reviewed.  HENT:     Head: Normocephalic and atraumatic.  Cardiovascular:     Rate and Rhythm: Regular rhythm. Tachycardia present.     Heart sounds: Normal heart sounds.  Pulmonary:     Effort: Pulmonary effort is normal.     Breath sounds: Wheezing and rhonchi present.  Abdominal:     General: Bowel sounds are normal.  Musculoskeletal:     Right lower leg: Edema (trace) present.     Left lower leg: Edema (trace) present.  Skin:    General: Skin is warm and dry.  Neurological:     Mental Status: She is alert.     Lab Results: Lab Results  Component Value Date   NA 131 (L) 11/13/2022   K 4.1 11/13/2022   CO2 23 11/13/2022   GLUCOSE 101 (H) 11/13/2022   BUN 8 11/13/2022   CREATININE 0.51 11/13/2022   CALCIUM 7.7 (L) 11/13/2022   EGFR 90 10/14/2022   GFRNONAA >60 11/13/2022    BNP (last 3 results) Recent Labs    11/10/22 0911  BNP 303.1*    ProBNP (last 3 results) No results for input(s): "PROBNP" in the last 8760 hours.    Latest Ref Rng & Units 11/13/2022    4:38 AM 11/12/2022    3:14 AM 11/11/2022    3:06 AM  BMP  Glucose 70 - 99 mg/dL 101  107  90   BUN 8 - 23 mg/dL 8  5  8    Creatinine 0.44 - 1.00 mg/dL 0.51  0.58  0.63   Sodium 135 - 145 mmol/L 131  129  135   Potassium 3.5 - 5.1  mmol/L 4.1  2.9  3.6   Chloride 98 - 111 mmol/L 102  100  108   CO2 22 - 32 mmol/L 23  20  22    Calcium 8.9 - 10.3 mg/dL 7.7  7.5  7.6       Latest Ref Rng & Units 11/13/2022    4:38 AM 11/12/2022    3:14 AM 11/10/2022    8:16 AM  Hepatic Function  Total Protein 6.5 - 8.1 g/dL 5.0  5.1  5.5   Albumin 3.5 - 5.0 g/dL 2.2  2.3  2.8   AST 15 - 41 U/L 13  15  19    ALT 0 - 44 U/L 12  14  17    Alk Phosphatase 38 - 126 U/L 45  51  45   Total Bilirubin 0.3 - 1.2 mg/dL 0.3  0.5  0.4       Latest Ref Rng & Units 11/13/2022    9:09 AM 11/13/2022    4:38 AM 11/12/2022    3:14 AM  CBC  WBC 4.0 - 10.5 K/uL 7.1  5.4  7.6   Hemoglobin 12.0 - 15.0 g/dL 9.9  8.6  8.5  Hematocrit 36.0 - 46.0 % 32.7  27.1  27.2   Platelets 150 - 400 K/uL 439  366  360    Lipid Panel     Component Value Date/Time   CHOL 141 05/01/2022 0757   TRIG 93 07/06/2022 0312   HDL 52 05/01/2022 0757   CHOLHDL 2.7 05/01/2022 0757   VLDL 9 05/01/2022 0757   LDLCALC 80 05/01/2022 0757   Cardiac Panel (last 3 results) No results for input(s): "CKTOTAL", "CKMB", "TROPONINI", "RELINDX" in the last 72 hours.  HEMOGLOBIN A1C Lab Results  Component Value Date   HGBA1C 5.7 (H) 04/30/2022   MPG 116.89 04/30/2022   TSH Recent Labs    07/05/22 2143 08/29/22 0424 11/10/22 0850  TSH 4.392 4.935* 2.132   Imaging: Imaging results have been reviewed and DG CHEST PORT 1 VIEW  Result Date: 11/13/2022 CLINICAL DATA:  Fever EXAM: PORTABLE CHEST 1 VIEW COMPARISON:  Chest radiograph dated November 10, 2022 FINDINGS: The heart size and mediastinal contours are within normal limits. Low lung volumes with bibasilar hazy opacities suggesting atelectasis or small effusions. No acute osseous abnormality. IMPRESSION: Low lung volumes with bibasilar hazy opacities suggesting atelectasis or small effusions. Electronically Signed   By: Keane Police D.O.   On: 11/13/2022 09:02    Cardiac Studies:  07/07/2022 Summary LE Venous Duplex:  RIGHT:  -  There is no evidence of deep vein thrombosis in the lower extremity.  However, portions of this examination were limited- see technologist  comments above.  - No cystic structure found in the popliteal fossa.  LEFT:  - There is no evidence of deep vein thrombosis in the lower extremity.  However, portions of this examination were limited- see technologist  comments above.  - No cystic structure found in the popliteal fossa.      IMPRESSIONS ECHO 07/06/2022  1. Left ventricular ejection fraction, by estimation, is 50%. Left  ventricular ejection fraction by 2D MOD biplane is 45.8 %. The left  ventricle has low normal function. The left ventricle has no regional wall  motion abnormalities. Left ventricular  diastolic parameters are consistent with Grade I diastolic dysfunction  (impaired relaxation).   2. Right ventricular systolic function is normal. The right ventricular  size is normal.   3. The mitral valve is grossly normal. Trivial mitral valve  regurgitation.   4. The aortic valve is calcified. Aortic valve regurgitation is not  visualized. Aortic valve sclerosis is present, with no evidence of aortic  valve stenosis.  In 04/2022, LVEF was 6-65%.     EKG: 11/10/2022: Sinus rhythm with PAC's. Non-specific T wave abnormalities in inferior leads.   06/27/2022: NSR with rate variation, normal R wave progression, no evidence of ischemia   06/21/2022: NSR with second degree type I AV block, prolonged QTc  Recent Results (from the past 43800 hour(s))  ECHOCARDIOGRAM COMPLETE   Collection Time: 11/11/22  1:28 PM  Result Value   Weight 2,239.87   Height 62   BP 114/54   S' Lateral 3.20   Area-P 1/2 4.60   Est EF 55 - 60%   Narrative      ECHOCARDIOGRAM REPORT       Patient Name:   [name] Date of Exam: 11/11/2022 Medical Rec #:  [id number]       Height:       62.0 in Accession #:    [id number]      Weight:       140.0 lb Date of  Birth:  08/16/1940       BSA:           1.643 m Patient Age:    82 years        BP:           114/54 mmHg Patient Gender: F               HR:           91 bpm. Exam Location:  Inpatient  Procedure: 2D Echo, Cardiac Doppler and Color Doppler  Indications:     R94.31 Abnormal EKG   History:         Patient has prior history of Echocardiogram examinations, most                  recent 07/06/2022. TIA, Signs/Symptoms:Edema, Shortness of                  Breath, Dyspnea and Dizziness/Lightheadedness; Risk                  Factors:Hypertension.   Sonographer:     Roseanna Rainbow RDCS Referring Phys:  [id number] [place] Diagnosing Phys: Collene Mares Westin Knotts  IMPRESSIONS    1. Left ventricular ejection fraction, by estimation, is 55 to 60%. Left ventricular ejection fraction by 3D volume is 60 %. The left ventricle has normal function. The left ventricle has no regional wall motion abnormalities. Left ventricular diastolic  parameters are consistent with Grade I diastolic dysfunction (impaired relaxation).  2. Right ventricular systolic function is normal. The right ventricular size is normal. There is moderately elevated pulmonary artery systolic pressure.  3. The mitral valve is grossly normal. Mild mitral valve regurgitation.  4. The aortic valve is calcified. Aortic valve regurgitation is not visualized. Aortic valve sclerosis/calcification is present, without any evidence of aortic stenosis.  FINDINGS  Left Ventricle: Left ventricular ejection fraction, by estimation, is 55 to 60%. Left ventricular ejection fraction by 3D volume is 60 %. The left ventricle has normal function. The left ventricle has no regional wall motion abnormalities. The left  ventricular internal cavity size was normal in size. There is no left ventricular hypertrophy. Left ventricular diastolic parameters are consistent with Grade I diastolic dysfunction (impaired relaxation).  Right Ventricle: The right ventricular size is normal. No increase in right  ventricular wall thickness. Right ventricular systolic function is normal. There is moderately elevated pulmonary artery systolic pressure. The tricuspid regurgitant velocity is  3.18 m/s, and with an assumed right atrial pressure of 15 mmHg, the estimated right ventricular systolic pressure is 123XX123 mmHg.  Left Atrium: Left atrial size was normal in size.  Right Atrium: Right atrial size was normal in size.  Pericardium: There is no evidence of pericardial effusion.  Mitral Valve: The mitral valve is grossly normal. Mild mitral valve regurgitation.  Tricuspid Valve: The tricuspid valve is grossly normal. Tricuspid valve regurgitation is mild.  Aortic Valve: The aortic valve is calcified. Aortic valve regurgitation is not visualized. Aortic valve sclerosis/calcification is present, without any evidence of aortic stenosis.  Pulmonic Valve: The pulmonic valve was grossly normal. Pulmonic valve regurgitation is not visualized.  Aorta: The aortic root is normal in size and structure.  IAS/Shunts: No atrial level shunt detected by color flow Doppler.    LEFT VENTRICLE PLAX 2D LVIDd:         4.60 cm         Diastology LVIDs:         3.20 cm  LV e' medial:   8.05 cm/s LV PW:         0.80 cm         LV E/e' medial: 11.5 LV IVS:        1.30 cm LVOT diam:     2.00 cm LV SV:         67              3D Volume EF LV SV Index:   41              LV 3D EF:    Left LVOT Area:     3.14 cm                     ventricul                                             ar                                             ejection                                             fraction                                             by 3D                                             volume is                                             60 %.                                  3D Volume EF:                                3D EF:        60 %                                LV EDV:       94 ml                                 LV ESV:       37 ml                                LV SV:  56 ml  RIGHT VENTRICLE             IVC RV S prime:     10.20 cm/s  IVC diam: 2.30 cm TAPSE (M-mode): 1.7 cm  LEFT ATRIUM             Index        RIGHT ATRIUM          Index LA diam:        2.90 cm 1.77 cm/m   RA Area:     9.33 cm LA Vol (A2C):   60.8 ml 37.01 ml/m  RA Volume:   16.20 ml 9.86 ml/m LA Vol (A4C):   29.2 ml 17.77 ml/m LA Biplane Vol: 44.6 ml 27.15 ml/m  AORTIC VALVE LVOT Vmax:   110.00 cm/s LVOT Vmean:  77.100 cm/s LVOT VTI:    0.214 m   AORTA Ao Root diam: 2.90 cm Ao Asc diam:  3.00 cm  MITRAL VALVE               TRICUSPID VALVE MV Area (PHT): 4.60 cm    TR Peak grad:   40.4 mmHg MV Decel Time: 165 msec    TR Vmax:        318.00 cm/s MV E velocity: 92.70 cm/s MV A velocity: 83.20 cm/s  SHUNTS MV E/A ratio:  1.11        Systemic VTI:  0.21 m                            Systemic Diam: 2.00 cm  Public relations account executive signed by Floydene Flock Signature Date/Time: 11/11/2022/2:53:01 PM       Final     *Note: Due to a large number of results and/or encounters for the requested time period, some results have not been displayed. A complete set of results can be found in Results Review.    Scheduled Meds:  ALPRAZolam  0.5 mg Oral QHS   DULoxetine  60 mg Oral Daily   enoxaparin (LOVENOX) injection  40 mg Subcutaneous Q24H   levothyroxine  88 mcg Oral Q0600   magnesium oxide  400 mg Oral BID   mesalamine  2.4 g Oral Q breakfast   metoprolol succinate  25 mg Oral Daily   pantoprazole  40 mg Oral Daily   polycarbophil  625 mg Oral Daily   potassium chloride  40 mEq Oral BID   rosuvastatin  10 mg Oral QPM   Continuous Infusions:  sodium chloride 75 mL/hr at 11/13/22 1156   PRN Meds:.acetaminophen **OR** acetaminophen, ondansetron **OR** ondansetron (ZOFRAN) IV, mouth rinse  Assessment & Plan: Kierstynn Joe is a 82 y.o. female with Mobitz type I, history of CVA in 2023,   infectious colitis status post exploratory laparotomy complicated by 2 bouts of C. difficile and COVID-19 went to rehab now in assisted living continue to have intermittent diarrhea.    Hypotension 2/2 dehydration Continue to hold Lasix. Hold lisinopril. Can restart Toprol XL 12.5 mg if needed for tachycardia.     Fatigue versus syncope  We have not been able to obtain event monitor due to multiple abdominal surgeries and extensive rehab stays. Patient states she feels tired all the time but she could be having syncopal episodes. We ordered event monitor at one of our initial visits but has not been placed yet, will place monitor on follow-up.     HFpEF 55% with grade I DD, currently compensated  Repeat echocardiogram shows normal LVEF Hold Lasix and give PRN only. Continue to wear compression stockings. Hold lisinopril and restart Toprol XL 12.5 mg if BP/HR allows.     Crohn's disease complicated by c.diff colitis Her gut is still not working like it was prior to her bowel ischemia and emergency ex-lap. She is following with GI and slowly introducing new foods to her diet but this is certainly contributing to her dehydration as she frequently has diarrhea now.    Floydene Flock, DO 11/13/2022, 1:36 PM Office: [phone] Fax: [phone] Pager: [phone]

## 2022-11-13 NOTE — Progress Notes (Signed)
[facility] PROGRESS NOTE    Progress Note  Alanea Neilson  [id number] DOB: 05/25/1941 DOA: 11/10/2022 PCP: Reynold Bowen, MD     Brief Narrative:   Kascha Lamkins is an 82 y.o. 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 difficile and COVID-19 went to rehab now in assisted living continue to have intermittent diarrhea, although she relates her diet has been improving has been following her diet on the day of admission had 4 watery stools came into the ED with weakness and dizziness found to be in the ED lethargic and she relates she feels she has syncopized was found to be hypotensive in the ED responded to fluids.   Assessment/Plan:   Distributive shock on admission/near syncope: In the setting of diarrhea and history of underlying Crohn's disease and current Lasix use. She also seen by cardiology today who agreed the hypotension was likely secondary to dehydration in the setting of Lasix.  Lasix and lisinopril were held on admission. Improved with fluid resuscitation. PT evaluated the patient recommended no PT follow-up.  History of Crohn's disease/C. difficile colitis: Continue continue Lialda every morning. Having 2 episodes of stool daily.  Hypovolemic hyponatremia: Improving with fluid resuscitation continue IV fluids for an additional 24 hours.  Hypokalemia: Replete orally now resolved.  Hypomagnesemia: Replete orally now resolved.  History of Mobitz type I block: Slightly tachycardic increase her metoprolol.  Normocytic anemia: Hemoglobin is relatively stable, iron studies shows a ferritin of 90.  Vascular dementia: With a TIA in 2023 on low-dose Xanax and Cymbalta. Continue  Crestor.  Hypothyroidism: Continue Synthroid.  New onset of fevers: With a Tmax of 101.5, check a CBC with differential. Will monitor fever curve. She has been off antibiotics this whole hospital stay, check blood cultures, UA urine cultures and chest x-ray.   DVT prophylaxis: lovenox Family Communication:Daughter Status is: Inpatient Remains inpatient appropriate because: Distributive shock    Code Status:     Code Status Orders  (From admission, onward)           Start     Ordered   11/10/22 1555  Do not attempt resuscitation (DNR)  Continuous       Question Answer Comment  If patient has no pulse and is not breathing Do Not Attempt Resuscitation   If patient has a pulse and/or is breathing: Medical Treatment Goals MEDICAL INTERVENTIONS DESIRED: Use advanced airway interventions, mechanical ventilation or cardioversion in appropriate circumstances; Use medication/IV fluids as indicated; Provide comfort medications; Transfer to Progressive/Stepdown/ICU as indicated.   Consent: Discussion documented in EHR or advanced directives reviewed      11/10/22 1555           Code Status History     Date Active Date Inactive Code Status Order ID Comments User Context   10/17/2022 1754 10/19/2022 2327 DNR [id number]  Edwin Dada, MD ED   08/28/2022 2303 09/11/2022 1923 DNR [id number]  Vianne Bulls, MD ED   07/05/2022 1425 07/16/2022 2150 DNR [id number]  Germain Osgood, PA-C ED   04/30/2022 2127 05/01/2022 2012 DNR [id number]  Toy Baker, MD Inpatient   05/11/2020 2259 05/18/2020 1634 DNR [id number]  Lenore Cordia, MD ED   01/09/2012 2342 01/11/2012 1140 Full Code [id number]  Alanson Puls, RN Inpatient  Advance Directive Documentation    [place] Most Recent Value  Type of Advance Directive Living will, Out of facility DNR (pink MOST or yellow form)  Pre-existing out of facility DNR order (yellow form or pink MOST form) --  "MOST" Form in Place?  --         IV Access:   Peripheral IV   Procedures and diagnostic studies:   ECHOCARDIOGRAM COMPLETE  Result Date: 11/11/2022    ECHOCARDIOGRAM REPORT   Patient Name:   [name] Date of Exam: 11/11/2022 Medical Rec #:  [id number]       Height:       62.0 in Accession #:    [id number]      Weight:       140.0 lb Date of Birth:  01-01-1941       BSA:          1.643 m Patient Age:    79 years        BP:           114/54 mmHg Patient Gender: F               HR:           91 bpm. Exam Location:  Inpatient Procedure: 2D Echo, Cardiac Doppler and Color Doppler Indications:     R94.31 Abnormal EKG  History:         Patient has prior history of Echocardiogram examinations, most                  recent 07/06/2022. TIA, Signs/Symptoms:Edema, Shortness of                  Breath, Dyspnea and Dizziness/Lightheadedness; Risk                  Factors:Hypertension.  Sonographer:     Roseanna Rainbow RDCS Referring Phys:  [id number] [place] Diagnosing Phys: Collene Mares Custovic IMPRESSIONS  1. Left ventricular ejection fraction, by estimation, is 55 to 60%. Left ventricular ejection fraction by 3D volume is 60 %. The left ventricle has normal function. The left ventricle has no regional wall motion abnormalities. Left ventricular diastolic  parameters are consistent with Grade I diastolic dysfunction (impaired relaxation).  2. Right ventricular systolic function is normal. The right ventricular size is normal. There is moderately elevated pulmonary artery systolic pressure.  3. The mitral valve is grossly normal. Mild mitral valve regurgitation.  4. The aortic valve is calcified. Aortic valve regurgitation is not visualized. Aortic valve sclerosis/calcification is present, without any evidence of aortic stenosis. FINDINGS  Left Ventricle: Left ventricular ejection fraction, by estimation, is 55 to 60%. Left ventricular ejection fraction by 3D volume is 60 %. The left ventricle has normal function. The left ventricle has  no regional wall motion abnormalities. The left ventricular internal cavity size was normal in size. There is no left ventricular hypertrophy. Left ventricular diastolic parameters are consistent with Grade I diastolic dysfunction (impaired relaxation). Right Ventricle: The right ventricular size is normal. No increase in right ventricular wall thickness. Right ventricular systolic function is normal. There is moderately elevated pulmonary artery systolic pressure. The tricuspid regurgitant velocity is 3.18 m/s, and with an assumed right atrial pressure of 15 mmHg, the estimated right ventricular systolic pressure is 123XX123 mmHg. Left Atrium: Left atrial size was normal in size. Right Atrium: Right atrial size was normal in size. Pericardium: There is no evidence of pericardial effusion. Mitral Valve: The mitral valve is  grossly normal. Mild mitral valve regurgitation. Tricuspid Valve: The tricuspid valve is grossly normal. Tricuspid valve regurgitation is mild. Aortic Valve: The aortic valve is calcified. Aortic valve regurgitation is not visualized. Aortic valve sclerosis/calcification is present, without any evidence of aortic stenosis. Pulmonic Valve: The pulmonic valve was grossly normal. Pulmonic valve regurgitation is not visualized. Aorta: The aortic root is normal in size and structure. IAS/Shunts: No atrial level shunt detected by color flow Doppler.  LEFT VENTRICLE PLAX 2D LVIDd:         4.60 cm         Diastology LVIDs:         3.20 cm         LV e' medial:   8.05 cm/s LV PW:         0.80 cm         LV E/e' medial: 11.5 LV IVS:        1.30 cm LVOT diam:     2.00 cm LV SV:         67              3D Volume EF LV SV Index:   41              LV 3D EF:    Left LVOT Area:     3.14 cm                     ventricul                                             ar                                             ejection                                             fraction                                             by  3D                                             volume is                                             60 %.                                 3D Volume EF:                                3D EF:  60 %                                LV EDV:       94 ml                                LV ESV:       37 ml                                LV SV:        56 ml RIGHT VENTRICLE             IVC RV S prime:     10.20 cm/s  IVC diam: 2.30 cm TAPSE (M-mode): 1.7 cm LEFT ATRIUM             Index        RIGHT ATRIUM          Index LA diam:        2.90 cm 1.77 cm/m   RA Area:     9.33 cm LA Vol (A2C):   60.8 ml 37.01 ml/m  RA Volume:   16.20 ml 9.86 ml/m LA Vol (A4C):   29.2 ml 17.77 ml/m LA Biplane Vol: 44.6 ml 27.15 ml/m  AORTIC VALVE LVOT Vmax:   110.00 cm/s LVOT Vmean:  77.100 cm/s LVOT VTI:    0.214 m  AORTA Ao Root diam: 2.90 cm Ao Asc diam:  3.00 cm MITRAL VALVE               TRICUSPID VALVE MV Area (PHT): 4.60 cm    TR Peak grad:   40.4 mmHg MV Decel Time: 165 msec    TR Vmax:        318.00 cm/s MV E velocity: 92.70 cm/s MV A velocity: 83.20 cm/s  SHUNTS MV E/A ratio:  1.11        Systemic VTI:  0.21 m                            Systemic Diam: 2.00 cm Collene Mares Custovic Electronically signed by Floydene Flock Signature Date/Time: 11/11/2022/2:53:01 PM    Final      Medical Consultants:   None.   Subjective:    Alara Buckley Whetsel cont to have 3-4 BM  Objective:    Vitals:   11/12/22 2045 11/13/22 0054 11/13/22 0305 11/13/22 0547  BP: 121/63 136/60  117/70  Pulse: 85 98  91  Resp: 20 20  18   Temp: 98.6 F (37 C) (!) 101.5 F (38.6 C) 100.2 F (37.9 C) 99.7 F (37.6 C)  TempSrc: Oral Oral Oral Oral  SpO2: 94% 95%  97%  Weight:      Height:       SpO2: 97 % O2 Flow Rate (L/min): 2 L/min   Intake/Output Summary (Last 24 hours) at 11/13/2022 0744 Last data filed at 11/13/2022 0501 Gross per 24 hour  Intake 840 ml  Output 650 ml  Net 190 ml   Filed Weights   11/10/22 0806 11/10/22 1717   Weight: 59 kg 63.5 kg    Exam: General exam: In no acute distress. Respiratory system: Good air movement and clear to auscultation. Cardiovascular system: S1 & S2 heard, RRR. No JVD.  Gastrointestinal system: Abdomen is nondistended, soft and nontender.  Extremities: No pedal edema. Skin: No rashes, lesions or ulcers Psychiatry: Judgement and insight appear normal. Mood & affect appropriate.    Data Reviewed:    Labs: Basic Metabolic Panel: Recent Labs  Lab 11/10/22 0816 11/10/22 1857 11/11/22 0306 11/12/22 0309 11/12/22 0314 11/13/22 0438  NA 131*  --  135  --  129* 131*  K 3.1*  --  3.6  --  2.9* 4.1  CL 101  --  108  --  100 102  CO2 22  --  22  --  20* 23  GLUCOSE 120*  --  90  --  107* 101*  BUN 11  --  8  --  5* 8  CREATININE 0.61 0.66 0.63  --  0.58 0.51  CALCIUM 7.8*  --  7.6*  --  7.5* 7.7*  MG  --   --   --  1.7  --  2.1   GFR Estimated Creatinine Clearance: 48.3 mL/min (by C-G formula based on SCr of 0.51 mg/dL). Liver Function Tests: Recent Labs  Lab 11/10/22 0816 11/12/22 0314 11/13/22 0438  AST 19 15 13*  ALT 17 14 12   ALKPHOS 45 51 45  BILITOT 0.4 0.5 0.3  PROT 5.5* 5.1* 5.0*  ALBUMIN 2.8* 2.3* 2.2*   No results for input(s): "LIPASE", "AMYLASE" in the last 168 hours. No results for input(s): "AMMONIA" in the last 168 hours. Coagulation profile No results for input(s): "INR", "PROTIME" in the last 168 hours. COVID-19 Labs  Recent Labs    11/12/22 1125  FERRITIN 90    Lab Results  Component Value Date   SARSCOV2NAA POSITIVE (A) 11/10/2022   SARSCOV2NAA POSITIVE (A) 10/17/2022   [place] NEGATIVE 05/11/2020    CBC: Recent Labs  Lab 11/10/22 0816 11/10/22 1857 11/11/22 0306 11/12/22 0314 11/13/22 0438  WBC 6.1 5.3 5.9 7.6 5.4  HGB 7.9* 8.2* 7.3* 8.5* 8.6*  HCT 25.4* 27.3* 23.7* 27.2* 27.1*  MCV 99.2 101.9* 100.9* 97.8 96.1  PLT 301 323 321 360 366   Cardiac Enzymes: No results for input(s): "CKTOTAL", "CKMB",  "CKMBINDEX", "TROPONINI" in the last 168 hours. BNP (last 3 results) No results for input(s): "PROBNP" in the last 8760 hours. CBG: Recent Labs  Lab 11/10/22 0902  GLUCAP 111*   D-Dimer: No results for input(s): "DDIMER" in the last 72 hours. Hgb A1c: No results for input(s): "HGBA1C" in the last 72 hours. Lipid Profile: No results for input(s): "CHOL", "HDL", "LDLCALC", "TRIG", "CHOLHDL", "LDLDIRECT" in the last 72 hours. Thyroid function studies: Recent Labs    11/10/22 0850  TSH 2.132   Anemia work up: Recent Labs    11/12/22 1125  VITAMINB12 1,418*  FOLATE 33.1  FERRITIN 90  TIBC 241*  IRON 11*  RETICCTPCT 3.8*   Sepsis Labs: Recent Labs  Lab 11/10/22 1857 11/11/22 0306 11/12/22 0314 11/13/22 0438  WBC 5.3 5.9 7.6 5.4   Microbiology Recent Results (from the past 240 hour(s))  SARS Coronavirus 2 by RT PCR (hospital order, performed in [facility] hospital lab) *cepheid single result test* Anterior Nasal Swab     Status: Abnormal   Collection Time: 11/10/22  9:17 AM   Specimen: Anterior Nasal Swab  Result Value Ref Range Status   SARS Coronavirus 2 by RT PCR POSITIVE (A) NEGATIVE Final    Comment: (NOTE) SARS-CoV-2 target nucleic acids are DETECTED  SARS-CoV-2 RNA is generally detectable in upper respiratory specimens  during the acute phase  of infection.  Positive results are indicative  of the presence of the identified virus, but do not rule out bacterial infection or co-infection with other pathogens not detected by the test.  Clinical correlation with patient history and  other diagnostic information is necessary to determine patient infection status.  The expected result is negative.  Fact Sheet for Patients:   https://www.patel.info/   Fact Sheet for Healthcare Providers:   https://hall.com/    This test is not yet approved or cleared by the [place] FDA and  has been authorized for detection  and/or diagnosis of SARS-CoV-2 by FDA under an Emergency Use Authorization (EUA).  This EUA will remain in effect (meaning this test can be used) for the duration of  the COVID-19 declaration under Section 564(b)(1)  of the Act, 21 U.S.C. section 360-bbb-3(b)(1), unless the authorization is terminated or revoked sooner.   Performed at [facility], [place] [address], [place],  [address]   MRSA Next Gen by PCR, Nasal     Status: None   Collection Time: 11/10/22  5:17 PM   Specimen: Nasal Mucosa; Nasal Swab  Result Value Ref Range Status   MRSA by PCR Next Gen NOT DETECTED NOT DETECTED Final    Comment: (NOTE) The GeneXpert MRSA Assay (FDA approved for NASAL specimens only), is one component of a comprehensive MRSA colonization surveillance program. It is not intended to diagnose MRSA infection nor to guide or monitor treatment for MRSA infections. Test performance is not FDA approved in patients less than 85 years old. Performed at [facility], [place] [address], [place], [place] [address]      Medications:    ALPRAZolam  0.5 mg Oral QHS   DULoxetine  60 mg Oral Daily   enoxaparin (LOVENOX) injection  40 mg Subcutaneous Q24H   levothyroxine  88 mcg Oral Q0600   magnesium oxide  400 mg Oral BID   mesalamine  2.4 g Oral Q breakfast   metoprolol succinate  12.5 mg Oral Daily   pantoprazole  40 mg Oral Daily   polycarbophil  625 mg Oral Daily   potassium chloride  40 mEq Oral BID   rosuvastatin  10 mg Oral QPM   Continuous Infusions:    LOS: 3 days   Charlynne Cousins  [facility]  11/13/2022, 7:44 AM

## 2022-11-14 ENCOUNTER — Other Ambulatory Visit (HOSPITAL_COMMUNITY): Payer: Self-pay

## 2022-11-14 DIAGNOSIS — K50919 Crohn's disease, unspecified, with unspecified complications: Secondary | ICD-10-CM

## 2022-11-14 DIAGNOSIS — I959 Hypotension, unspecified: Secondary | ICD-10-CM | POA: Diagnosis not present

## 2022-11-14 DIAGNOSIS — A0472 Enterocolitis due to Clostridium difficile, not specified as recurrent: Secondary | ICD-10-CM | POA: Diagnosis not present

## 2022-11-14 DIAGNOSIS — R509 Fever, unspecified: Secondary | ICD-10-CM | POA: Diagnosis not present

## 2022-11-14 DIAGNOSIS — L03114 Cellulitis of left upper limb: Secondary | ICD-10-CM

## 2022-11-14 DIAGNOSIS — R197 Diarrhea, unspecified: Secondary | ICD-10-CM | POA: Diagnosis not present

## 2022-11-14 LAB — CBC WITH DIFFERENTIAL/PLATELET
Abs Immature Granulocytes: 0.04 10*3/uL (ref 0.00–0.07)
Basophils Absolute: 0.1 10*3/uL (ref 0.0–0.1)
Basophils Relative: 1 %
Eosinophils Absolute: 0.1 10*3/uL (ref 0.0–0.5)
Eosinophils Relative: 1 %
HCT: 27.1 % — ABNORMAL LOW (ref 36.0–46.0)
Hemoglobin: 8.6 g/dL — ABNORMAL LOW (ref 12.0–15.0)
Immature Granulocytes: 0 %
Lymphocytes Relative: 28 %
Lymphs Abs: 2.6 10*3/uL (ref 0.7–4.0)
MCH: 30.5 pg (ref 26.0–34.0)
MCHC: 31.7 g/dL (ref 30.0–36.0)
MCV: 96.1 fL (ref 80.0–100.0)
Monocytes Absolute: 0.7 10*3/uL (ref 0.1–1.0)
Monocytes Relative: 8 %
Neutro Abs: 5.9 10*3/uL (ref 1.7–7.7)
Neutrophils Relative %: 62 %
Platelets: 426 10*3/uL — ABNORMAL HIGH (ref 150–400)
RBC: 2.82 MIL/uL — ABNORMAL LOW (ref 3.87–5.11)
RDW: 14.2 % (ref 11.5–15.5)
WBC: 9.3 10*3/uL (ref 4.0–10.5)
nRBC: 0 % (ref 0.0–0.2)

## 2022-11-14 LAB — COMPREHENSIVE METABOLIC PANEL
ALT: 11 U/L (ref 0–44)
AST: 13 U/L — ABNORMAL LOW (ref 15–41)
Albumin: 2.2 g/dL — ABNORMAL LOW (ref 3.5–5.0)
Alkaline Phosphatase: 44 U/L (ref 38–126)
Anion gap: 7 (ref 5–15)
BUN: 8 mg/dL (ref 8–23)
CO2: 21 mmol/L — ABNORMAL LOW (ref 22–32)
Calcium: 7.8 mg/dL — ABNORMAL LOW (ref 8.9–10.3)
Chloride: 100 mmol/L (ref 98–111)
Creatinine, Ser: 0.58 mg/dL (ref 0.44–1.00)
GFR, Estimated: >60 mL/min (ref >60)
Glucose, Bld: 122 mg/dL — ABNORMAL HIGH (ref 70–99)
Potassium: 4.6 mmol/L (ref 3.5–5.1)
Sodium: 128 mmol/L — ABNORMAL LOW (ref 135–145)
Total Bilirubin: 0.4 mg/dL (ref 0.3–1.2)
Total Protein: 5 g/dL — ABNORMAL LOW (ref 6.5–8.1)

## 2022-11-14 MED ORDER — SODIUM CHLORIDE 0.9 % IV SOLN: INTRAVENOUS | Status: AC

## 2022-11-14 MED ORDER — VANCOMYCIN HCL IN DEXTROSE 1-5 GM/200ML-% IV SOLN: 1000.0000 mg | INTRAVENOUS | Status: DC

## 2022-11-14 MED ORDER — FIDAXOMICIN 200 MG PO TABS
200.0000 mg | ORAL_TABLET | Freq: Two times a day (BID) | ORAL | Status: DC
  Administered 2022-11-14 – 2022-11-15 (×3): 200 mg via ORAL
  Filled 2022-11-14 (×2): qty 1
  Filled 2022-11-14: qty 5
  Filled 2022-11-14 (×2): qty 1

## 2022-11-14 MED ORDER — VANCOMYCIN HCL 1250 MG/250ML IV SOLN
1250.0000 mg | Freq: Once | INTRAVENOUS | Status: AC
  Administered 2022-11-14: 1250 mg via INTRAVENOUS
  Filled 2022-11-14: qty 250

## 2022-11-14 NOTE — Progress Notes (Signed)
Oral temp at 0040 102. F. In & out cath order obtained from Raenette Rover NP, intermittent straight cath performed by this RN, 150 cc cloudy yellow urine obtained. Specimen sent to lab at this time.    11/14/22 0040  Assess: MEWS Score  Temp (!) 102 F (38.9 C)  BP (!) 112/52  MAP (mmHg) 71  Pulse Rate (!) 107  Resp 18  Level of Consciousness Alert  SpO2 97 %  O2 Device Nasal Cannula  Patient Activity (if Appropriate) In bed  O2 Flow Rate (L/min) 3 L/min  Assess: MEWS Score  MEWS Temp 2  MEWS Systolic 0  MEWS Pulse 1  MEWS RR 0  MEWS LOC 0  MEWS Score 3  MEWS Score Color Yellow  Assess: if the MEWS score is Yellow or Red  Were vital signs taken at a resting state? Yes  Focused Assessment Change from prior assessment (see assessment flowsheet)  Does the patient meet 2 or more of the SIRS criteria? Yes  Does the patient have a confirmed or suspected source of infection? Yes  Provider and Rapid Response Notified? Yes  MEWS guidelines implemented  No, previously yellow, continue vital signs every 4 hours  Notify: Charge Nurse/RN  Name of Charge Nurse/RN Notified Karrie Doffing, RN  Provider Notification  Provider Name/Title Raenette Rover, NP  Date Provider Notified 11/14/22  Time Provider Notified 0100  Method of Notification  (secure chat)  Notification Reason Change in status;Other (Comment) (temp 102 oral despite tylenol and other pulmonary toileting measures taken)  Provider response Evaluate remotely  Date of Provider Response 11/14/22  Time of Provider Response 0104  Assess: SIRS CRITERIA  SIRS Temperature  1  SIRS Pulse 1  SIRS Respirations  0  SIRS WBC 0  SIRS Score Sum  2

## 2022-11-14 NOTE — TOC Benefit Eligibility Note (Signed)
Patient Teacher, English as a foreign language completed.    The patient is currently admitted and upon discharge could be taking Dificid 200 mg tablets.  The current 10 day co-pay is $1,583.24.   The patient is insured through [employer] Part D   This test claim was processed through [facility] amounts may vary at other pharmacies due to pharmacy/plan contracts, or as the patient moves through the different stages of their insurance plan.  Lyndel Safe, [place] Patient Advocate Specialist [place] Patient Advocate Team Direct Number: [phone]  Fax: [phone]

## 2022-11-14 NOTE — Consult Note (Signed)
[facility]    Date of Admission:  11/10/2022     Reason for Consult: Diarrhea     Referring Physician: Dr Aileen Fass  Current antibiotics: Fidaxomicin   ASSESSMENT:    82 y.o. female admitted with:  Fevers Suspected left upper extremity cellulitis at site of previous PIV  Recent C diff infection with acute worsening of her diarrhea concerning for recurrent episode Crohn's disease  Patient is admitted on 11/10/2022 with weakness, dizziness, and near syncope.  She began having fevers on hospital day #3 as well as increased watery diarrhea.  This is in the setting of a recently treated C. difficile infection with vancomycin x 10 days as well as diagnosis of probable Crohn's disease in February 2024.  She also has left upper extremity warmth and erythema with induration at her prior PIV site as a potential etiology of fevers.  RECOMMENDATIONS:    I would recommend testing for C. difficile to determine if she has recurrent infection given that her diarrheal symptoms had drastically improved after treatment with oral vancomycin last month and to rule out as a cause of fevers if testing is negative Continue empiric fidaxomicin for now If C diff is negative, then could consider GI panel if diarrhea persists Could also engage with GI to determine if diarrhea is more related to Crohn's if infectious diarrhea work up negative Given her left upper extremity cellulitis, will start vancomycin per pharmacy Check left upper extremity venous duplex to assess for thrombus Follow cultures Following   Principal Problem:   Hypotension (arterial) Active Problems:   Fibromyalgia   Essential hypertension   Hypothyroidism   GERD (gastroesophageal reflux disease)   Anxiety with depression   Hypovolemia   Chronic diarrhea   Peripheral edema   MEDICATIONS:    Scheduled Meds:  ALPRAZolam  0.5 mg Oral QHS   DULoxetine  60 mg Oral Daily   enoxaparin (LOVENOX)  injection  40 mg Subcutaneous Q24H   fidaxomicin  200 mg Oral BID   levothyroxine  88 mcg Oral Q0600   magnesium oxide  400 mg Oral BID   mesalamine  2.4 g Oral Q breakfast   metoprolol succinate  25 mg Oral Daily   pantoprazole  40 mg Oral Daily   polycarbophil  625 mg Oral Daily   potassium chloride  40 mEq Oral BID   rosuvastatin  10 mg Oral QPM   Continuous Infusions:  sodium chloride 75 mL/hr at 11/14/22 1038   vancomycin     PRN Meds:.acetaminophen **OR** acetaminophen, ondansetron **OR** ondansetron (ZOFRAN) IV, mouth rinse  HPI:    [name] is a 82 y.o. female with past medical history as noted below who presented to the emergency department 11/10/22 with weakness and dizziness who had near syncope and was hypotensive in the emergency department.  She has been admitted since that time.  It was felt that her near syncope was in the setting of chronic diarrhea with history of underlying Crohn's disease and current Lasix use.  She had no leukocytosis on admission.  She was afebrile.  However, on 11/12/2022 she was febrile to 101.5 F.  This fever repeated itself yesterday to 102 F.  She remains without leukocytosis.  Blood and urine cultures were obtained which are currently no growth.  In addition to her fevers, she started having more profuse watery diarrhea.  A C. difficile test was ordered today but has not been obtained as of yet.  She reports  that prior to admission she was having more formed stool.  She had a chest x-ray on admission that was unremarkable.  Repeat chest x-ray was obtained yesterday showing low lung volumes with bibasilar opacities suggestive of atelectasis. She has no pulmonary symptoms to suggest pneumonia.  She has no urinary symptoms.   She recently had a positive C. difficile toxigenic PCR on 10/23/2022.  This was treated with vancomycin 125 mg 4 times daily x 10 days by her gastroenterologist with improvement as noted above.  This is on the background of a  prolonged hospitalization in late November 2023.  CT scan at that time showed severe dilation of the colon with a large amount of stool and wall thickening.  She underwent exploratory laparotomy on 11/24 due to concern for perforated bowel.  There was no ischemic bowel found at that time and no obvious source of infection.  However, it was presumed that she had some sort of infectious vs inflammatory colitis.  She was then admitted in January with persistent diarrhea.  Apparently she had been diagnosed with C. difficile and treated at her facility with 10 days of vancomycin.  It was unclear as to whether or not she truly had C. difficile and had no improvement on vancomycin.  She underwent infectious workup at that time including C. difficile and GI pathogen panel which were negative.  She had a flex sigmoidoscopy showing severe inflammation.  Biopsies were concerning for Crohn's disease.  She has been followed with GI since this that time.  She she is frustrated today when I saw her later this afternoon because she started having diarrhea a couple of days ago and has not been tested for C. difficile yet.  She also reports that her fevers are unusual as she has had no fevers previously with her C. difficile infection.  She otherwise has no abdominal pain.  She reports having 4 watery diarrhea bowel movements today and 4 yesterday.  She was started on 7 earlier this afternoon and received her first dose at 3 PM.  She had a left AC PIV inserted by EMS.  She reported this became red and "bulbous".  It remains tender.  It was removed on 11/12/2022.  Past Medical History:  Diagnosis Date   C. difficile colitis    Cancer ([place]) 2014   kidney   COVID    Esophageal stricture    Fibromyalgia    GERD (gastroesophageal reflux disease)    Helicobacter pylori gastritis    Hypertension    Intention tremor    Lumbago    Memory loss    Osteoarthritis of spine    Pneumonia    hx of years ago    PUD (peptic ulcer  disease)    PVD (peripheral vascular disease) ([place])     Social History   Tobacco Use   Smoking status: Never   Smokeless tobacco: Never  Vaping Use   Vaping Use: Never used  Substance Use Topics   Alcohol use: Not Currently    Alcohol/week: 7.0 standard drinks of alcohol    Types: 7 Glasses of wine per week    Comment: Patient no longer drinks alcohol due to her Chrohn's disease   Drug use: No    Family History  Problem Relation Age of Onset   Heart disease Mother    COPD Mother    Thyroid disease Mother    Diabetes Father    Heart disease Father        CABG  Stroke Father    Parkinson's disease Father    Heart disease Brother    Stroke Brother    Hyperlipidemia Brother    Diabetes Daughter    Hearing loss Brother    Crohn's disease Other        neice   Hypertension Son    Diabetes Son    Melanoma Son    Colon cancer Neg Hx    Stomach cancer Neg Hx    Esophageal cancer Neg Hx     Allergies  Allergen Reactions   Morphine And Related Itching and Rash    Given in IV.    Review of Systems  All other systems reviewed and are negative.   OBJECTIVE:   Blood pressure 123/64, pulse 90, temperature 98.7 F (37.1 C), temperature source Oral, resp. rate 18, height 5\' 2"  (1.575 m), weight 63.5 kg, SpO2 100 %. Body mass index is 25.6 kg/m.  Physical Exam Constitutional:      General: She is not in acute distress.    Appearance: Normal appearance.  HENT:     Head: Normocephalic and atraumatic.  Eyes:     Extraocular Movements: Extraocular movements intact.     Conjunctiva/sclera: Conjunctivae normal.  Pulmonary:     Effort: Pulmonary effort is normal. No respiratory distress.     Comments: She is breathing comfortably on 2 L nasal cannula Abdominal:     General: There is no distension.     Palpations: Abdomen is soft.  Musculoskeletal:        General: Tenderness present.     Cervical back: Normal range of motion and neck supple.  Skin:    General:  Skin is warm and dry.     Findings: Erythema present.     Comments: Her left upper extremity is warm with erythema.  There is also a palpable induration around her left AC fossa  Neurological:     Mental Status: She is alert.      Lab Results: Lab Results  Component Value Date   WBC 9.3 11/14/2022   HGB 8.6 (L) 11/14/2022   HCT 27.1 (L) 11/14/2022   MCV 96.1 11/14/2022   PLT 426 (H) 11/14/2022    Lab Results  Component Value Date   NA 128 (L) 11/14/2022   K 4.6 11/14/2022   CO2 21 (L) 11/14/2022   GLUCOSE 122 (H) 11/14/2022   BUN 8 11/14/2022   CREATININE 0.58 11/14/2022   CALCIUM 7.8 (L) 11/14/2022   GFRNONAA >60 11/14/2022   GFRAA >60 05/16/2020    Lab Results  Component Value Date   ALT 11 11/14/2022   AST 13 (L) 11/14/2022   ALKPHOS 44 11/14/2022   BILITOT 0.4 11/14/2022    No results found for: "CRP"     Component Value Date/Time   ESRSEDRATE 20 05/06/2016 1708    I have reviewed the micro and lab results in Epic.  Imaging: DG CHEST PORT 1 VIEW  Result Date: 11/13/2022 CLINICAL DATA:  Fever EXAM: PORTABLE CHEST 1 VIEW COMPARISON:  Chest radiograph dated November 10, 2022 FINDINGS: The heart size and mediastinal contours are within normal limits. Low lung volumes with bibasilar hazy opacities suggesting atelectasis or small effusions. No acute osseous abnormality. IMPRESSION: Low lung volumes with bibasilar hazy opacities suggesting atelectasis or small effusions. Electronically Signed   By: Keane Police D.O.   On: 11/13/2022 09:02     Imaging independently reviewed in Epic.  Raynelle Highland for Infectious Disease [facility]  Group [phone] pager 11/14/2022, 5:28 PM  I have personally spent 80 minutes involved in face-to-face and non-face-to-face activities for this patient on the day of the visit. Professional time spent includes the following activities: Preparing to see the patient (review of tests), Obtaining and/or reviewing  separately obtained history (admission/discharge record), Performing a medically appropriate examination and/or evaluation , Ordering medications/tests/procedures, referring and communicating with other health care professionals, Documenting clinical information in the EMR, Independently interpreting results (not separately reported), Communicating results to the patient/family/caregiver, Counseling and educating the patient/family/caregiver and Care coordination (not separately reported).

## 2022-11-14 NOTE — Progress Notes (Signed)
Pharmacy Antibiotic Note  [name] is a 82 y.o. female admitted on 11/10/2022 with cellulitis.  Pharmacy has been consulted for Vanco dosing.  ID: LUE cellulitis at site of prior IV. (S/p 10 days of PO vanc for c.diff positive )  Dificid 4/4 > 4/14 Vanco 4/4>>  Plan:  Vanco 1250mg  IV x 1 Vancomycin 1000 mg IV Q 24 hrs. Goal AUC 400-550. Expected AUC: 538 SCr used: 0.8   Height: 5\' 2"  (157.5 cm) Weight: 63.5 kg (139 lb 15.9 oz) IBW/kg (Calculated) : 50.1  Temp (24hrs), Avg:99.8 F (37.7 C), Min:98.3 F (36.8 C), Max:102 F (38.9 C)  Recent Labs  Lab 11/10/22 1857 11/11/22 0306 11/12/22 0314 11/13/22 0438 11/13/22 0909 11/14/22 0416  WBC 5.3 5.9 7.6 5.4 7.1 9.3  CREATININE 0.66 0.63 0.58 0.51  --  0.58    Estimated Creatinine Clearance: 48.3 mL/min (by C-G formula based on SCr of 0.58 mg/dL).    Allergies  Allergen Reactions   Morphine And Related Itching and Rash    Given in IV.    [name] S. Alford Highland, PharmD, BCPS Clinical Staff Pharmacist [email or web address] .  Wayland Salinas 11/14/2022 5:55 PM

## 2022-11-14 NOTE — Progress Notes (Signed)
[facility] PROGRESS NOTE    Progress Note  [name]  [id number] DOB: 1940/11/10 DOA: 11/10/2022 PCP: Reynold Bowen, MD     Brief Narrative:   [name] is an 82 y.o. 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 difficile and COVID-19 went to rehab now in assisted living continue to have intermittent diarrhea, although she relates her diet has been improving has been following her diet on the day of admission had 4 watery stools came into the ED with weakness and dizziness found to be in the ED lethargic and she relates she feels she has syncopized was found to be hypotensive in the ED responded to fluids.   Assessment/Plan:   Distributive shock on admission/near syncope: In the setting of diarrhea and history of underlying Crohn's disease and current Lasix use. She also seen by cardiology today who agreed the hypotension was likely secondary to dehydration in the setting of Lasix.   Lasix and lisinopril were held on admission. PT evaluated the patient recommended no PT follow-up. Cardiology was consulted recommended an event monitor as an outpatient. They agree with restarting Toprol low-dose  New onset of fevers: Tmax of 102.0, leukocytosis is rising. Blood cultures pending Urine cultures being Treated C. difficile PCR she has new onset of fevers more than 5 watery stools in the last 24 hours with a history of C. difficile.  History of Crohn's disease/C. difficile colitis: Continue continue Lialda every morning. New onset of fevers with watery stools and a complex history of history of Crohn's and C. difficile. Will go ahead and consult infectious disease. Go ahead and check a C. difficile PCR as she is having  fevers rising leukocytosis and watery stools more than 5 in the last 24 hours.  Hypovolemic hyponatremia: She cannot get her IV fluids as she refusing additional IV, have spoken to the patient when to start an IV line and started on IV fluids. Check a basic metabolic panel tomorrow morning.  Hypokalemia: Replete orally now resolved.  Hypomagnesemia: Replete orally now resolved.  History of Mobitz type I block: Slightly tachycardic increase her metoprolol.  Normocytic anemia: Hemoglobin is relatively stable, iron studies shows a ferritin of 90.  Vascular dementia: With a TIA in 2023 on low-dose Xanax and Cymbalta. Continue Crestor.  Hypothyroidism: Continue Synthroid.    DVT prophylaxis: lovenox Family Communication:Daughter Status is: Inpatient Remains inpatient appropriate because: Distributive shock    Code Status:     Code Status Orders  (From admission, onward)           Start     Ordered   11/10/22 1555  Do not attempt resuscitation (DNR)  Continuous       Question Answer Comment  If patient has no pulse and is not breathing Do Not Attempt Resuscitation   If patient has a pulse and/or is breathing: Medical Treatment Goals MEDICAL INTERVENTIONS DESIRED: Use advanced airway interventions, mechanical ventilation or cardioversion in appropriate circumstances; Use medication/IV fluids as indicated; Provide comfort medications; Transfer to Progressive/Stepdown/ICU as indicated.   Consent: Discussion documented in EHR or advanced directives reviewed      11/10/22 1555           Code Status History     Date Active Date Inactive Code Status Order ID  Comments User Context   10/17/2022 1754 10/19/2022 2327 DNR [id number]  Edwin Dada, MD ED   08/28/2022 2303 09/11/2022 1923 DNR [id number]  Vianne Bulls, MD ED   07/05/2022 1425 07/16/2022 2150 DNR [id number]  Germain Osgood, PA-C ED   04/30/2022 2127 05/01/2022 2012 DNR [id number]  Toy Baker, MD  Inpatient   05/11/2020 2259 05/18/2020 1634 DNR [id number]  Lenore Cordia, MD ED   01/09/2012 2342 01/11/2012 1140 Full Code [id number]  Alanson Puls, RN Inpatient      Advance Directive Documentation    Flowsheet Row Most Recent Value  Type of Advance Directive Living will, Out of facility DNR (pink MOST or yellow form)  Pre-existing out of facility DNR order (yellow form or pink MOST form) --  "MOST" Form in Place? --         IV Access:   Peripheral IV   Procedures and diagnostic studies:   DG CHEST PORT 1 VIEW  Result Date: 11/13/2022 CLINICAL DATA:  Fever EXAM: PORTABLE CHEST 1 VIEW COMPARISON:  Chest radiograph dated November 10, 2022 FINDINGS: The heart size and mediastinal contours are within normal limits. Low lung volumes with bibasilar hazy opacities suggesting atelectasis or small effusions. No acute osseous abnormality. IMPRESSION: Low lung volumes with bibasilar hazy opacities suggesting atelectasis or small effusions. Electronically Signed   By: Keane Police D.O.   On: 11/13/2022 09:02     Medical Consultants:   None.   Subjective:    Adaliah Marana Taitano cont to have 3-4 BM  Objective:    Vitals:   11/13/22 1600 11/13/22 2012 11/14/22 0040 11/14/22 0601  BP:  119/66 (!) 112/52 (!) 96/59  Pulse:  97 (!) 107 83  Resp:  18 18 19   Temp: 100 F (37.8 C) 100.3 F (37.9 C) (!) 102 F (38.9 C) 98.3 F (36.8 C)  TempSrc: Oral Oral Oral Oral  SpO2: 96% 96% 97% 100%  Weight:      Height:       SpO2: 100 % O2 Flow Rate (L/min): 2 L/min   Intake/Output Summary (Last 24 hours) at 11/14/2022 0918 Last data filed at 11/14/2022 0825 Gross per 24 hour  Intake 890 ml  Output 1475 ml  Net -585 ml    Filed Weights   11/10/22 0806 11/10/22 1717  Weight: 59 kg 63.5 kg    Exam: General exam: In no acute distress. Respiratory system: Good air movement and clear to auscultation. Cardiovascular system: S1 & S2 heard, RRR. No JVD. Gastrointestinal system:  Abdomen is nondistended, soft and nontender.  Extremities: No pedal edema. Skin: No rashes, lesions or ulcers Psychiatry: Judgement and insight appear normal. Mood & affect appropriate.  Data Reviewed:    Labs: Basic Metabolic Panel: Recent Labs  Lab 11/10/22 0816 11/10/22 1857 11/11/22 0306 11/12/22 0309 11/12/22 0314 11/13/22 0438 11/14/22 0416  NA 131*  --  135  --  129* 131* 128*  K 3.1*  --  3.6  --  2.9* 4.1 4.6  CL 101  --  108  --  100 102 100  CO2 22  --  22  --  20* 23 21*  GLUCOSE 120*  --  90  --  107* 101* 122*  BUN 11  --  8  --  5* 8 8  CREATININE 0.61 0.66 0.63  --  0.58 0.51 0.58  CALCIUM 7.8*  --  7.6*  --  7.5* 7.7* 7.8*  MG  --   --   --  1.7  --  2.1  --     GFR Estimated Creatinine Clearance: 48.3 mL/min (by C-G formula based on SCr of 0.58 mg/dL). Liver Function Tests: Recent Labs  Lab 11/10/22 0816 11/12/22 0314 11/13/22 0438 11/14/22 0416  AST 19 15 13* 13*  ALT 17 14 12 11   ALKPHOS 45 51 45 44  BILITOT 0.4 0.5 0.3 0.4  PROT 5.5* 5.1* 5.0* 5.0*  ALBUMIN 2.8* 2.3* 2.2* 2.2*    No results for input(s): "LIPASE", "AMYLASE" in the last 168 hours. No results for input(s): "AMMONIA" in the last 168 hours. Coagulation profile No results for input(s): "INR", "PROTIME" in the last 168 hours. COVID-19 Labs  Recent Labs    11/12/22 1125  FERRITIN 90     Lab Results  Component Value Date   SARSCOV2NAA POSITIVE (A) 11/10/2022   SARSCOV2NAA POSITIVE (A) 10/17/2022   [place] NEGATIVE 05/11/2020    CBC: Recent Labs  Lab 11/11/22 0306 11/12/22 0314 11/13/22 0438 11/13/22 0909 11/14/22 0416  WBC 5.9 7.6 5.4 7.1 9.3  NEUTROABS  --   --   --  4.1 5.9  HGB 7.3* 8.5* 8.6* 9.9* 8.6*  HCT 23.7* 27.2* 27.1* 32.7* 27.1*  MCV 100.9* 97.8 96.1 97.9 96.1  PLT 321 360 366 439* 426*    Cardiac Enzymes: No results for input(s): "CKTOTAL", "CKMB", "CKMBINDEX", "TROPONINI" in the last 168 hours. BNP (last 3 results) No results for  input(s): "PROBNP" in the last 8760 hours. CBG: Recent Labs  Lab 11/10/22 0902  GLUCAP 111*    D-Dimer: No results for input(s): "DDIMER" in the last 72 hours. Hgb A1c: No results for input(s): "HGBA1C" in the last 72 hours. Lipid Profile: No results for input(s): "CHOL", "HDL", "LDLCALC", "TRIG", "CHOLHDL", "LDLDIRECT" in the last 72 hours. Thyroid function studies: No results for input(s): "TSH", "T4TOTAL", "T3FREE", "THYROIDAB" in the last 72 hours.  Invalid input(s): "FREET3"  Anemia work up: Recent Labs    11/12/22 1125  VITAMINB12 1,418*  FOLATE 33.1  FERRITIN 90  TIBC 241*  IRON 11*  RETICCTPCT 3.8*    Sepsis Labs: Recent Labs  Lab 11/12/22 0314 11/13/22 0438 11/13/22 0909 11/14/22 0416  WBC 7.6 5.4 7.1 9.3    Microbiology Recent Results (from the past 240 hour(s))  SARS Coronavirus 2 by RT PCR (hospital order, performed in [facility] hospital lab) *cepheid single result test* Anterior Nasal Swab     Status: Abnormal   Collection Time: 11/10/22  9:17 AM   Specimen: Anterior Nasal Swab  Result Value Ref Range Status   SARS Coronavirus 2 by RT PCR POSITIVE (A) NEGATIVE Final    Comment: (NOTE) SARS-CoV-2 target nucleic acids are DETECTED  SARS-CoV-2 RNA is generally detectable in upper respiratory specimens  during the acute phase of infection.  Positive results are indicative  of the presence of the identified virus, but do not rule out bacterial infection or co-infection with other pathogens not detected by the test.  Clinical correlation with patient history and  other diagnostic information is necessary to determine patient infection status.  The expected result is negative.  Fact Sheet for Patients:   https://www.patel.info/   Fact Sheet for Healthcare Providers:   https://hall.com/    This test is not yet approved or cleared by the [place] FDA and  has been authorized for detection and/or  diagnosis of SARS-CoV-2 by FDA under an Emergency Use Authorization (EUA).  This EUA will remain in effect (meaning this test can be used) for the  duration of  the COVID-19 declaration under Section 564(b)(1)  of the Act, 21 U.S.C. section 360-bbb-3(b)(1), unless the authorization is terminated or revoked sooner.   Performed at [facility], [place] [address] [place], [place] [address]   MRSA Next Gen by PCR, Nasal     Status: None   Collection Time: 11/10/22  5:17 PM   Specimen: Nasal Mucosa; Nasal Swab  Result Value Ref Range Status   MRSA by PCR Next Gen NOT DETECTED NOT DETECTED Final    Comment: (NOTE) The GeneXpert MRSA Assay (FDA approved for NASAL specimens only), is one component of a comprehensive MRSA colonization surveillance program. It is not intended to diagnose MRSA infection nor to guide or monitor treatment for MRSA infections. Test performance is not FDA approved in patients less than 32 years old. Performed at [facility], [place] [address], [place], [place] [address]   Culture, blood (Routine X 2) w Reflex to ID Panel     Status: None (Preliminary result)   Collection Time: 11/13/22  9:09 AM   Specimen: BLOOD LEFT ARM  Result Value Ref Range Status   Specimen Description   Final    BLOOD LEFT ARM Performed at [place] [address], [place], [place] [address]    Special Requests   Final    BOTTLES DRAWN AEROBIC ONLY Blood Culture adequate volume Performed at [facility] [address] [place], [place] [address]    Culture   Final    NO GROWTH < 24 HOURS Performed at [facility] [address], [place], [place] [address]    Report Status PENDING  Incomplete  Culture, blood (Routine X 2) w Reflex to ID Panel     Status: None (Preliminary result)   Collection Time: 11/13/22  9:09 AM   Specimen: BLOOD LEFT HAND  Result Value Ref Range Status   Specimen Description    Final    BLOOD LEFT HAND Performed at [facility] [address], [place], [place] [address]    Special Requests   Final    BOTTLES DRAWN AEROBIC ONLY Blood Culture adequate volume Performed at [facility] [address], [place], [place] [address]    Culture   Final    NO GROWTH < 24 HOURS Performed at [facility] [address], [place], [place] [address]    Report Status PENDING  Incomplete     Medications:    ALPRAZolam  0.5 mg Oral QHS   DULoxetine  60 mg Oral Daily   enoxaparin (LOVENOX) injection  40 mg Subcutaneous Q24H   levothyroxine  88 mcg Oral Q0600   magnesium oxide  400 mg Oral BID   mesalamine  2.4 g Oral Q breakfast   metoprolol succinate  25 mg Oral Daily   pantoprazole  40 mg Oral Daily   polycarbophil  625 mg Oral Daily   potassium chloride  40 mEq Oral BID   rosuvastatin  10 mg Oral QPM   Continuous Infusions:    LOS: 4 days   Charlynne Cousins  [facility]  11/14/2022, 9:18 AM

## 2022-11-15 ENCOUNTER — Other Ambulatory Visit (HOSPITAL_COMMUNITY): Payer: Self-pay

## 2022-11-15 ENCOUNTER — Inpatient Hospital Stay (HOSPITAL_COMMUNITY): Payer: Medicare Other

## 2022-11-15 ENCOUNTER — Telehealth (HOSPITAL_COMMUNITY): Payer: Self-pay | Admitting: Pharmacy Technician

## 2022-11-15 DIAGNOSIS — R52 Pain, unspecified: Secondary | ICD-10-CM

## 2022-11-15 DIAGNOSIS — I5030 Unspecified diastolic (congestive) heart failure: Secondary | ICD-10-CM | POA: Diagnosis not present

## 2022-11-15 DIAGNOSIS — K5090 Crohn's disease, unspecified, without complications: Secondary | ICD-10-CM | POA: Diagnosis not present

## 2022-11-15 DIAGNOSIS — I8002 Phlebitis and thrombophlebitis of superficial vessels of left lower extremity: Secondary | ICD-10-CM

## 2022-11-15 DIAGNOSIS — L03114 Cellulitis of left upper limb: Secondary | ICD-10-CM | POA: Diagnosis not present

## 2022-11-15 DIAGNOSIS — R197 Diarrhea, unspecified: Secondary | ICD-10-CM | POA: Diagnosis not present

## 2022-11-15 DIAGNOSIS — E861 Hypovolemia: Secondary | ICD-10-CM | POA: Diagnosis not present

## 2022-11-15 DIAGNOSIS — I959 Hypotension, unspecified: Secondary | ICD-10-CM | POA: Diagnosis not present

## 2022-11-15 LAB — BASIC METABOLIC PANEL
Anion gap: 6 (ref 5–15)
BUN: 8 mg/dL (ref 8–23)
CO2: 20 mmol/L — ABNORMAL LOW (ref 22–32)
Calcium: 7.7 mg/dL — ABNORMAL LOW (ref 8.9–10.3)
Chloride: 103 mmol/L (ref 98–111)
Creatinine, Ser: 0.56 mg/dL (ref 0.44–1.00)
GFR, Estimated: >60 mL/min (ref >60)
Glucose, Bld: 105 mg/dL — ABNORMAL HIGH (ref 70–99)
Potassium: 4.5 mmol/L (ref 3.5–5.1)
Sodium: 129 mmol/L — ABNORMAL LOW (ref 135–145)

## 2022-11-15 LAB — C DIFFICILE (CDIFF) QUICK SCRN (NO PCR REFLEX)
C Diff antigen: POSITIVE — AB
C Diff toxin: NEGATIVE

## 2022-11-15 MED ORDER — NYSTATIN 100000 UNIT/ML MT SUSP: OROMUCOSAL | 1 refills | Status: DC

## 2022-11-15 MED ORDER — FIDAXOMICIN 200 MG PO TABS: 200.0000 mg | ORAL_TABLET | Freq: Two times a day (BID) | ORAL | 0 refills | Status: DC

## 2022-11-15 MED ORDER — LINEZOLID 600 MG PO TABS: 600.0000 mg | ORAL_TABLET | Freq: Two times a day (BID) | ORAL | 0 refills | Status: AC

## 2022-11-15 MED ORDER — FUROSEMIDE 40 MG PO TABS
40.0000 mg | ORAL_TABLET | Freq: Every day | ORAL | Status: DC
  Administered 2022-11-15: 40 mg via ORAL
  Filled 2022-11-15: qty 1

## 2022-11-15 MED ORDER — FIDAXOMICIN 200 MG PO TABS
200.0000 mg | ORAL_TABLET | Freq: Two times a day (BID) | ORAL | Status: DC
  Filled 2022-11-15: qty 5

## 2022-11-15 NOTE — Progress Notes (Signed)
PT Cancellation Note  Patient Details Name: [name] MRN: [id number] DOB: [birth year]   Cancelled Treatment:    Reason Eval/Treat Not Completed: Medical issues which prohibited therapy, per nsg, patient having frequent loose BM, just assisted with hygiene.  [name] PT Acute Rehabilitation Services Office [phone] Weekend pager-[phone]    Rada Hay 11/15/2022, 12:17 PM

## 2022-11-15 NOTE — Telephone Encounter (Signed)
Hi [name] is off today. I spoke with [name] and she is requesting a refill on the Nystatin today please sent to [facility]. She is getting discharged today and is scared those antibotics rx'ed today will give her thrush. Thank you for advising.

## 2022-11-15 NOTE — Plan of Care (Signed)
  Problem: Education: Goal: Knowledge of risk factors and measures for prevention of condition will improve Outcome: Completed/Met   Problem: Coping: Goal: Psychosocial and spiritual needs will be supported Outcome: Completed/Met   Problem: Respiratory: Goal: Will maintain a patent airway Outcome: Completed/Met Goal: Complications related to the disease process, condition or treatment will be avoided or minimized Outcome: Completed/Met   Problem: Education: Goal: Knowledge of General Education information will improve Description: Including pain rating scale, medication(s)/side effects and non-pharmacologic comfort measures Outcome: Completed/Met   Problem: Health Behavior/Discharge Planning: Goal: Ability to manage health-related needs will improve Outcome: Completed/Met   Problem: Clinical Measurements: Goal: Ability to maintain clinical measurements within normal limits will improve Outcome: Completed/Met Goal: Will remain free from infection Outcome: Completed/Met Goal: Diagnostic test results will improve Outcome: Completed/Met Goal: Respiratory complications will improve Outcome: Completed/Met Goal: Cardiovascular complication will be avoided Outcome: Completed/Met   Problem: Activity: Goal: Risk for activity intolerance will decrease Outcome: Completed/Met   Problem: Nutrition: Goal: Adequate nutrition will be maintained Outcome: Completed/Met   Problem: Coping: Goal: Level of anxiety will decrease Outcome: Completed/Met   Problem: Elimination: Goal: Will not experience complications related to bowel motility Outcome: Completed/Met Goal: Will not experience complications related to urinary retention Outcome: Completed/Met   Problem: Pain Managment: Goal: General experience of comfort will improve Outcome: Completed/Met   Problem: Safety: Goal: Ability to remain free from injury will improve Outcome: Completed/Met   Problem: Skin Integrity: Goal:  Risk for impaired skin integrity will decrease Outcome: Completed/Met   

## 2022-11-15 NOTE — Addendum Note (Signed)
Addended by: Quentin Mulling on: 11/15/2022 01:46 PM   Modules accepted: Orders

## 2022-11-15 NOTE — Progress Notes (Signed)
[facility]  Date of Admission:  11/10/2022           Reason for visit: Follow up on fevers  Current antibiotics: Fidaxomicin Vancomycin IV   ASSESSMENT:    82 y.o. female admitted with:  Left upper extremity cellulitis and superficial thrombophlebitis of cephalic vein at site of previous PIV  Recent C diff infection with acute worsening of her diarrhea concerning for recurrent episode Crohn's disease   Patient is admitted on 11/10/2022 with weakness, dizziness, and near syncope.  She began having fevers on hospital day #3 as well as increased watery diarrhea.  This is in the setting of a recently treated C. difficile infection (Positive Toxigenic PCR) with vancomycin x 10 days as well as diagnosis of probable Crohn's disease in February 2024.  Suspect recurrence of C diff plus left arm cellulitis at prior PIV site as cause of fevers. She has had improvement in fevers since starting vancomycin and improved diarrhea since starting Dificid.  RECOMMENDATIONS:    Continue antibiotics for left arm cellulitis and superficial thrombophlebitis.  Plan to keep on vancomycin while admitted and change to linezolid 600mg  PO BID at discharge to complete therapy for 2 weeks total.  End date = 11/28/22 Continue Dificid 200mg  PO BID for presumed C diff Would recommend sending stool for C diff testing if she has another BM to confirm or exclude diagnosis.  I think this is important to know as her prior testing outpatient was just a toxigenic PCR test and our algorithm here would be informative.   In the absence of more information, would continue Dificid while here and then she'll get a 10 day supply delivered upon discharge to complete therapy.  This will provide coverage roughly through the end of her linezolid treatment Pharmacy to work with trying to bridge her Dificid from discharge until can be delivered to her facility Will sign off, please call as  needed   Principal Problem:   Hypotension (arterial) Active Problems:   Fibromyalgia   Essential hypertension   Hypothyroidism   GERD (gastroesophageal reflux disease)   Anxiety with depression   Hypovolemia   Chronic diarrhea   Peripheral edema   Cellulitis of left upper extremity   C. difficile diarrhea   Fever    MEDICATIONS:    Scheduled Meds:  ALPRAZolam  0.5 mg Oral QHS   DULoxetine  60 mg Oral Daily   enoxaparin (LOVENOX) injection  40 mg Subcutaneous Q24H   fidaxomicin  200 mg Oral BID   levothyroxine  88 mcg Oral Q0600   magnesium oxide  400 mg Oral BID   mesalamine  2.4 g Oral Q breakfast   metoprolol succinate  25 mg Oral Daily   pantoprazole  40 mg Oral Daily   polycarbophil  625 mg Oral Daily   potassium chloride  40 mEq Oral BID   rosuvastatin  10 mg Oral QPM   Continuous Infusions:  sodium chloride 75 mL/hr at 11/15/22 0500   vancomycin     PRN Meds:.acetaminophen **OR** acetaminophen, ondansetron **OR** ondansetron (ZOFRAN) IV, mouth rinse  SUBJECTIVE:   24 hour events:  8 stool occurrences noted over the last 24 hours C. difficile testing ordered but not sent as staff said it was too watery She reports late yesterday her stool was more formed She has not had any diarrhea this morning Blood cultures remain no growth 24-hour fever curve improved with Tmax 99.6 Upper extremity duplex scheduled for today  Started on vancomycin overnight  She reports her left arm is feeling better.  Still some induration.  No more fevers.  Diarrhea improved.   Review of Systems  All other systems reviewed and are negative.     OBJECTIVE:   Blood pressure 109/68, pulse 97, temperature 98.7 F (37.1 C), temperature source Oral, resp. rate 18, height 5\' 2"  (1.575 m), weight 63.5 kg, SpO2 95 %. Body mass index is 25.6 kg/m.  Physical Exam Constitutional:      Appearance: Normal appearance.  HENT:     Head: Normocephalic and atraumatic.  Eyes:      Extraocular Movements: Extraocular movements intact.     Conjunctiva/sclera: Conjunctivae normal.  Abdominal:     General: There is no distension.     Palpations: Abdomen is soft.  Musculoskeletal:     Comments: Left UE redness and swelling improved.  Mild induration of AC fossa area.  Skin:    General: Skin is warm and dry.     Findings: No erythema.  Neurological:     General: No focal deficit present.     Mental Status: She is alert and oriented to person, place, and time.      Lab Results: Lab Results  Component Value Date   WBC 9.3 11/14/2022   HGB 8.6 (L) 11/14/2022   HCT 27.1 (L) 11/14/2022   MCV 96.1 11/14/2022   PLT 426 (H) 11/14/2022    Lab Results  Component Value Date   NA 129 (L) 11/15/2022   K 4.5 11/15/2022   CO2 20 (L) 11/15/2022   GLUCOSE 105 (H) 11/15/2022   BUN 8 11/15/2022   CREATININE 0.56 11/15/2022   CALCIUM 7.7 (L) 11/15/2022   GFRNONAA >60 11/15/2022   GFRAA >60 05/16/2020    Lab Results  Component Value Date   ALT 11 11/14/2022   AST 13 (L) 11/14/2022   ALKPHOS 44 11/14/2022   BILITOT 0.4 11/14/2022    No results found for: "CRP"     Component Value Date/Time   ESRSEDRATE 20 05/06/2016 1708     I have reviewed the micro and lab results in Epic.  Imaging: DG CHEST PORT 1 VIEW  Result Date: 11/13/2022 CLINICAL DATA:  Fever EXAM: PORTABLE CHEST 1 VIEW COMPARISON:  Chest radiograph dated November 10, 2022 FINDINGS: The heart size and mediastinal contours are within normal limits. Low lung volumes with bibasilar hazy opacities suggesting atelectasis or small effusions. No acute osseous abnormality. IMPRESSION: Low lung volumes with bibasilar hazy opacities suggesting atelectasis or small effusions. Electronically Signed   By: Larose Hires D.O.   On: 11/13/2022 09:02     Imaging independently reviewed in Epic.    Vedia Coffer for Infectious Disease [facility] [phone] pager 11/15/2022, 8:09 AM  I  have personally spent 50 minutes involved in face-to-face and non-face-to-face activities for this patient on the day of the visit. Professional time spent includes the following activities: Preparing to see the patient (review of tests), Obtaining and/or reviewing separately obtained history (admission/discharge record), Performing a medically appropriate examination and/or evaluation , Ordering medications/tests/procedures, referring and communicating with other health care professionals, Documenting clinical information in the EMR, Independently interpreting results (not separately reported), Communicating results to the patient/family/caregiver, Counseling and educating the patient/family/caregiver and Care coordination (not separately reported).

## 2022-11-15 NOTE — Telephone Encounter (Signed)
Inbound call from patient, states she was prescribed antibiotics, and every time she starts taking antibiotics she gets thrush again. Patient states she would like refill for medication to get rid of thrush.

## 2022-11-15 NOTE — Progress Notes (Signed)
[facility] PROGRESS NOTE    Progress Note  [name]  EEF:[id number] DOB: July 28, 1941 DOA: 11/10/2022 PCP: Adrian Prince, MD     Brief Narrative:   [name] is an 82 y.o. 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 difficile and COVID-19 went to rehab now in assisted living continue to have intermittent diarrhea, although she relates her diet has been improving has been following her diet on the day of admission had 4 watery stools came into the ED with weakness and dizziness found to be in the ED lethargic and she relates she feels she has syncopized was found to be hypotensive in the ED responded to fluids.   Assessment/Plan:   Distributive shock on admission/near syncope: In the setting of diarrhea and history of underlying Crohn's disease and current Lasix use. She also seen by cardiology today who agreed the hypotension was likely secondary to dehydration in the setting of Lasix.   Lasix and lisinopril were held on admission. PT evaluated the patient recommended no PT follow-up. Cardiology was consulted recommended an event monitor as an outpatient. They agree with restarting Toprol low-dose  Fever suspect due to left lower extremity cellulitis: Has defervesced with a Tmax of 99. Blood cultures have been sent negative till date, as well as C. difficile PCR. Infectious disease was consulted who agreed with plan Urine cultures are negative till date. Started empirically on Dificid  History of Crohn's disease/C. difficile colitis: Continue continue Lialda every morning. ID on board recommended start Dificid. Watery stools have improved..  Hypovolemic hyponatremia: She was started back on her IV fluids her sodium  is improving slowly.  Hypokalemia: Replete orally now resolved.  Hypomagnesemia: Replete orally now resolved.  History of Mobitz type I block: Slightly tachycardic increase her metoprolol.  Normocytic anemia: Hemoglobin is relatively stable, iron studies shows a ferritin of 90.  Vascular dementia: With a TIA in 2023 on low-dose Xanax and Cymbalta. Continue Crestor.  Hypothyroidism: Continue Synthroid.    DVT prophylaxis: lovenox Family Communication:Daughter Status is: Inpatient Remains inpatient appropriate because: Distributive shock    Code Status:     Code Status Orders  (From admission, onward)           Start     Ordered   11/10/22 1555  Do not attempt resuscitation (DNR)  Continuous       Question Answer Comment  If patient has no pulse and is not breathing Do Not Attempt Resuscitation   If patient has a pulse and/or is breathing: Medical Treatment Goals MEDICAL INTERVENTIONS DESIRED: Use advanced airway interventions, mechanical ventilation or cardioversion in appropriate circumstances; Use medication/IV fluids as indicated; Provide comfort medications; Transfer to Progressive/Stepdown/ICU as indicated.   Consent: Discussion documented in EHR or advanced directives reviewed      11/10/22 1555           Code Status History     Date Active Date Inactive Code Status Order ID Comments User Context   10/17/2022 1754 10/19/2022 2327 DNR [id number]  Alberteen Sam, MD ED   08/28/2022 2303 09/11/2022 1923 DNR [id number]  Briscoe Deutscher, MD ED   07/05/2022 1425 07/16/2022 2150 DNR [id number]  Kathlene Cote, PA-C ED   04/30/2022 2127 05/01/2022 2012 DNR [id number]  Therisa Doyne, MD  Inpatient   05/11/2020 2259 05/18/2020 1634 DNR [id number]  Charlsie Quest, MD ED   01/09/2012 2342 01/11/2012 1140 Full Code [id number]  Baldwin Crown, RN Inpatient      Advance Directive Documentation    Flowsheet Row Most Recent Value  Type of Advance Directive  Living will, Out of facility DNR (pink MOST or yellow form)  Pre-existing out of facility DNR order (yellow form or pink MOST form) --  "MOST" Form in Place? --         IV Access:   Peripheral IV   Procedures and diagnostic studies:   DG CHEST PORT 1 VIEW  Result Date: 11/13/2022 CLINICAL DATA:  Fever EXAM: PORTABLE CHEST 1 VIEW COMPARISON:  Chest radiograph dated November 10, 2022 FINDINGS: The heart size and mediastinal contours are within normal limits. Low lung volumes with bibasilar hazy opacities suggesting atelectasis or small effusions. No acute osseous abnormality. IMPRESSION: Low lung volumes with bibasilar hazy opacities suggesting atelectasis or small effusions. Electronically Signed   By: Larose Hires D.O.   On: 11/13/2022 09:02     Medical Consultants:   None.   Subjective:    [name] relates her bowel movements have significantly improved.  Objective:    Vitals:   11/14/22 0601 11/14/22 1355 11/14/22 2040 11/15/22 0443  BP: (!) 96/59 123/64 (!) 104/59 109/68  Pulse: 83 90 (!) 104 97  Resp: 19 18 19 18   Temp: 98.3 F (36.8 C) 98.7 F (37.1 C) 99.6 F (37.6 C) 98.7 F (37.1 C)  TempSrc: Oral Oral Oral Oral  SpO2: 100% 100% 94% 95%  Weight:      Height:       SpO2: 95 % O2 Flow Rate (L/min): 2 L/min   Intake/Output Summary (Last 24 hours) at 11/15/2022 0816 Last data filed at 11/15/2022 0500 Gross per 24 hour  Intake 2320.55 ml  Output 250 ml  Net 2070.55 ml    Filed Weights   11/10/22 0806 11/10/22 1717  Weight: 59 kg 63.5 kg    Exam: General exam: In no acute distress. Respiratory system: Good air movement and clear to auscultation. Cardiovascular system: S1 & S2 heard, RRR. No JVD. Gastrointestinal system: Abdomen is nondistended, soft and nontender.  Extremities: No pedal edema. Skin: No rashes, lesions or ulcers Psychiatry: Judgement and insight appear normal. Mood & affect appropriate.  Data Reviewed:    Labs: Basic  Metabolic Panel: Recent Labs  Lab 11/11/22 0306 11/12/22 0309 11/12/22 0314 11/13/22 0438 11/14/22 0416 11/15/22 0417  NA 135  --  129* 131* 128* 129*  K 3.6  --  2.9* 4.1 4.6 4.5  CL 108  --  100 102 100 103  CO2 22  --  20* 23 21* 20*  GLUCOSE 90  --  107* 101* 122* 105*  BUN 8  --  5* 8 8 8   CREATININE 0.63  --  0.58 0.51 0.58 0.56  CALCIUM 7.6*  --  7.5* 7.7* 7.8* 7.7*  MG  --  1.7  --  2.1  --   --     GFR Estimated Creatinine Clearance: 48.3 mL/min (by C-G formula based on SCr of 0.56 mg/dL). Liver Function Tests: Recent Labs  Lab 11/10/22 0816 11/12/22 0314 11/13/22 0438 11/14/22 0416  AST 19 15 13* 13*  ALT 17 14 12 11   ALKPHOS 45 51 45 44  BILITOT 0.4 0.5 0.3 0.4  PROT 5.5* 5.1* 5.0* 5.0*  ALBUMIN 2.8* 2.3* 2.2* 2.2*  No results for input(s): "LIPASE", "AMYLASE" in the last 168 hours. No results for input(s): "AMMONIA" in the last 168 hours. Coagulation profile No results for input(s): "INR", "PROTIME" in the last 168 hours. COVID-19 Labs  Recent Labs    11/12/22 1125  FERRITIN 90     Lab Results  Component Value Date   SARSCOV2NAA POSITIVE (A) 11/10/2022   SARSCOV2NAA POSITIVE (A) 10/17/2022   SARSCOV2NAA NEGATIVE 05/11/2020    CBC: Recent Labs  Lab 11/11/22 0306 11/12/22 0314 11/13/22 0438 11/13/22 0909 11/14/22 0416  WBC 5.9 7.6 5.4 7.1 9.3  NEUTROABS  --   --   --  4.1 5.9  HGB 7.3* 8.5* 8.6* 9.9* 8.6*  HCT 23.7* 27.2* 27.1* 32.7* 27.1*  MCV 100.9* 97.8 96.1 97.9 96.1  PLT 321 360 366 439* 426*    Cardiac Enzymes: No results for input(s): "CKTOTAL", "CKMB", "CKMBINDEX", "TROPONINI" in the last 168 hours. BNP (last 3 results) No results for input(s): "PROBNP" in the last 8760 hours. CBG: Recent Labs  Lab 11/10/22 0902  GLUCAP 111*    D-Dimer: No results for input(s): "DDIMER" in the last 72 hours. Hgb A1c: No results for input(s): "HGBA1C" in the last 72 hours. Lipid Profile: No results for input(s): "CHOL", "HDL",  "LDLCALC", "TRIG", "CHOLHDL", "LDLDIRECT" in the last 72 hours. Thyroid function studies: No results for input(s): "TSH", "T4TOTAL", "T3FREE", "THYROIDAB" in the last 72 hours.  Invalid input(s): "FREET3"  Anemia work up: Recent Labs    11/12/22 1125  VITAMINB12 1,418*  FOLATE 33.1  FERRITIN 90  TIBC 241*  IRON 11*  RETICCTPCT 3.8*    Sepsis Labs: Recent Labs  Lab 11/12/22 0314 11/13/22 0438 11/13/22 0909 11/14/22 0416  WBC 7.6 5.4 7.1 9.3    Microbiology Recent Results (from the past 240 hour(s))  SARS Coronavirus 2 by RT PCR (hospital order, [facility]) *cepheid single result test* Anterior Nasal Swab     Status: Abnormal   Collection Time: 11/10/22  9:17 AM   Specimen: Anterior Nasal Swab  Result Value Ref Range Status   SARS Coronavirus 2 by RT PCR POSITIVE (A) NEGATIVE Final    Comment: (NOTE) SARS-CoV-2 target nucleic acids are DETECTED  SARS-CoV-2 RNA is generally detectable in upper respiratory specimens  during the acute phase of infection.  Positive results are indicative  of the presence of the identified virus, but do not rule out bacterial infection or co-infection with other pathogens not detected by the test.  Clinical correlation with patient history and  other diagnostic information is necessary to determine patient infection status.  The expected result is negative.  Fact Sheet for Patients:   RoadLapTop.co.zahttps://www.fda.gov/media/158405/download   Fact Sheet for Healthcare Providers:   http://kim-miller.com/https://www.fda.gov/media/158404/download    This test is not yet approved or cleared by the [place] FDA and  has been authorized for detection and/or diagnosis of SARS-CoV-2 by FDA under an Emergency Use Authorization (EUA).  This EUA will remain in effect (meaning this test can be used) for the duration of  the COVID-19 declaration under Section 564(b)(1)  of the Act, 21 U.S.C. section 360-bbb-3(b)(1), unless the authorization  is terminated or revoked sooner.   Performed at [facility], [address] [place], [place] [address]   MRSA Next Gen by PCR, Nasal     Status: None   Collection Time: 11/10/22  5:17 PM   Specimen: Nasal Mucosa; Nasal Swab  Result Value Ref Range Status   MRSA by PCR  Next Gen NOT DETECTED NOT DETECTED Final    Comment: (NOTE) The GeneXpert MRSA Assay (FDA approved for NASAL specimens only), is one component of a comprehensive MRSA colonization surveillance program. It is not intended to diagnose MRSA infection nor to guide or monitor treatment for MRSA infections. Test performance is not FDA approved in patients less than 59 years old. Performed at [facility], [address], [place], [place] [address]   Culture, blood (Routine X 2) w Reflex to ID Panel     Status: None (Preliminary result)   Collection Time: 11/13/22  9:09 AM   Specimen: BLOOD LEFT ARM  Result Value Ref Range Status   Specimen Description   Final    BLOOD LEFT ARM Performed at [facility], [address], [place], [place] [address]    Special Requests   Final    BOTTLES DRAWN AEROBIC ONLY Blood Culture adequate volume Performed at [facility], [address] [place], [place] [address]    Culture   Final    NO GROWTH < 24 HOURS Performed at [facility], [address], [place], [place] [address]    Report Status PENDING  Incomplete  Culture, blood (Routine X 2) w Reflex to ID Panel     Status: None (Preliminary result)   Collection Time: 11/13/22  9:09 AM   Specimen: BLOOD LEFT HAND  Result Value Ref Range Status   Specimen Description   Final    BLOOD LEFT HAND Performed at [facility], [address] [place], [place] [address]    Special Requests   Final    BOTTLES DRAWN AEROBIC ONLY Blood Culture adequate volume Performed at [facility], [address],  [place], [place] [address]    Culture   Final    NO GROWTH < 24 HOURS Performed at [facility], [address] [place], [place] [address]    Report Status PENDING  Incomplete     Medications:    ALPRAZolam  0.5 mg Oral QHS   DULoxetine  60 mg Oral Daily   enoxaparin (LOVENOX) injection  40 mg Subcutaneous Q24H   fidaxomicin  200 mg Oral BID   levothyroxine  88 mcg Oral Q0600   magnesium oxide  400 mg Oral BID   mesalamine  2.4 g Oral Q breakfast   metoprolol succinate  25 mg Oral Daily   pantoprazole  40 mg Oral Daily   polycarbophil  625 mg Oral Daily   potassium chloride  40 mEq Oral BID   rosuvastatin  10 mg Oral QPM   Continuous Infusions:  sodium chloride 75 mL/hr at 11/15/22 0500   vancomycin        LOS: 5 days   Marinda Elk  [facility]  11/15/2022, 8:16 AM

## 2022-11-15 NOTE — TOC Benefit Eligibility Note (Signed)
Patient Product/process development scientist completed.    The patient is currently admitted and upon discharge could be taking linezolid (Zyvox) 600 mg tablets.  The current 10 day co-pay is $96.77.   The patient is insured through [employer]   This test claim was processed through [facility]- copay amounts may vary at other pharmacies due to pharmacy/plan contracts, or as the patient moves through the different stages of their insurance plan.  Roland Earl, CPHT Pharmacy Patient Advocate Specialist [facility] Patient Advocate Team Direct Number: [phone]  Fax: [phone]

## 2022-11-15 NOTE — Progress Notes (Signed)
Left upper extremity venous duplex has been completed. Preliminary results can be found in CV Proc through chart review.  Results were given to the patient's nurse, Hillary.   11/15/22 9:24 AM Olen Cordial RVT

## 2022-11-15 NOTE — TOC Transition Note (Signed)
Transition of Care [facility]) - CM/SW Discharge Note   Patient Details  Name: Renesme Filbeck MRN: [id number] Date of Birth: [birth year]  Transition of Care [facility]) CM/SW Contact:  Larrie Kass, LCSW Phone Number: 11/15/2022, 2:50 PM   Clinical Narrative:    Pt recommended for home health , [facility] to [facility].      Barriers to Discharge: Continued Medical Work up   Patient Goals and CMS Choice      Discharge Placement                         Discharge Plan and Services Additional resources added to the After Visit Summary for     Discharge Planning Services: CM Consult                                 Social Determinants of Health (SDOH) Interventions SDOH Screenings   Food Insecurity: No Food Insecurity (11/10/2022)  Housing: Low Risk  (11/10/2022)  Transportation Needs: No Transportation Needs (11/10/2022)  Utilities: Not At Risk (11/10/2022)  Tobacco Use: Low Risk  (11/10/2022)     Readmission Risk Interventions    11/11/2022   12:06 PM  Readmission Risk Prevention Plan  Transportation Screening Complete  Medication Review (RN Care Manager) Complete  PCP or Specialist appointment within 3-5 days of discharge Complete  HRI or Home Care Consult Complete  SW Recovery Care/Counseling Consult Complete  Palliative Care Screening Not Applicable  Skilled Nursing Facility Not Applicable

## 2022-11-15 NOTE — Care Management Important Message (Signed)
Important Message  Patient Details IM Letter given. Name: [name] MRN: [id number] Date of Birth: 06/23/1941   Medicare Important Message Given:  Yes     Caren Macadam 11/15/2022, 12:23 PM

## 2022-11-15 NOTE — Progress Notes (Signed)
Patient received discharge orders to go back to [facility]. Patient received discharge paperwork/instructions. RN went over discharge instructions/paperwork with patient. All questions and concerns were addressed prior to patient being discharged back to [facility]. Patient left the hospital stable, had discharge instructions, and had all personal belongings. Patient left the hospital with relative to be taken back to [facility].

## 2022-11-15 NOTE — Telephone Encounter (Signed)
Patient Advocate Encounter  Patient is approved through the [employer] Patient Assistance Program for [facility] through 08/12/2023.   Will be delivered to patient's home.   Roland Earl, CPhT Pharmacy Patient Advocate Specialist [facility] Patient Advocate Team Direct Number: [phone]  Fax: [phone]

## 2022-11-15 NOTE — Discharge Summary (Signed)
Physician Discharge Summary  [name] MSX:[id number] DOB: [birth year] DOA: 11/10/2022  PCP: Adrian Prince, MD  Admit date: 11/10/2022 Discharge date: 11/15/2022  Admitted From: ILF Disposition:  ILF  Recommendations for Outpatient Follow-up:  Follow up with PCP in 1-2 weeks Please obtain BMP/CBC in one week   Home Health:No Equipment/Devices:None  Discharge Condition:Stable CODE STATUS:Full Diet recommendation: Heart Healthy  Brief/Interim Summary:  82 y.o. 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 difficile and COVID-19 went to rehab now in assisted living continue to have intermittent diarrhea, although she relates her diet has been improving has been following her diet on the day of admission had 4 watery stools came into the ED with weakness and dizziness found to be in the ED lethargic and she relates she feels she has syncopized was found to be hypotensive in the ED responded to fluids.   Discharge Diagnoses:  Principal Problem:   Hypotension (arterial) Active Problems:   Fibromyalgia   Essential hypertension   Hypothyroidism   GERD (gastroesophageal reflux disease)   Anxiety with depression   Hypovolemia   Chronic diarrhea   Peripheral edema   Cellulitis of left upper extremity   C. difficile diarrhea   Fever  Distributive shock present on admission/near syncope: In the setting of diarrhea with a history of Crohn's disease and Lasix use. Seen by cardiology who agreed there is likely hypotensive secondary to diarrhea in the setting of Lasix. Lasix and lisinopril held on admission. She was close resuscitated her blood pressure improved. PT evaluated the patient recommended home health PT. Cardiology agreed to  continue metoprolol.  Hold Lasix will be reevaluated by cardiology as an outpatient to resume as an outpatient.  Fever multifactorial likely due to left lower extremity cellulitis and C. difficile colitis: She had a left lower extremity cellulitis and abdominal pain and possible C. difficile, C. difficile PCR was sent ID was consulted she was started empirically on Linezolid and Dificid she will continue treatment as an outpatient.  Chest x-ray urine cultures and blood cultures remain negative.  History of Crohn's disease/C. difficile colitis: No change made to her medication.  Hypovolemic hyponatremia: Resolved with IV fluid resuscitation.  Hypokalemia: Repleted orally now resolved.  Hypomagnesemia: Repleted orally now resolved.  Normocytic anemia: Hemoglobin stable.  Vascular dementia: No changes made to her medication continue statins.  Hypothyroidism: Continue Synthroid.   Discharge Instructions  Discharge Instructions     Diet - low sodium heart healthy   Complete by: As directed    Increase activity slowly   Complete by: As directed       Allergies as of 11/15/2022       Reactions   Morphine And Related Itching, Rash   Given in IV.        Medication List     STOP taking these medications    multivitamin with minerals tablet       TAKE these medications    acetaminophen 325 MG tablet Commonly known as: TYLENOL Take 650 mg by mouth 2 (two) times daily as needed for mild pain or headache.   ALPRAZolam 0.5 MG tablet Commonly known as: XANAX Take 1 tablet (0.5 mg total) by mouth 2 (two) times daily as needed. for anxiety What changed: when to take this  DULoxetine 60 MG capsule Commonly known as: CYMBALTA Take 60 mg by mouth daily.   EYE DROPS OP Place 2 drops into both eyes in the morning and at bedtime.   FIBER PO Take 1 tablet by mouth daily.   fidaxomicin 200 MG Tabs tablet Commonly known as: DIFICID Take 1 tablet (200 mg total) by  mouth 2 (two) times daily.   furosemide 40 MG tablet Commonly known as: LASIX Take 40 mg by mouth daily.   levothyroxine 88 MCG tablet Commonly known as: SYNTHROID Take 88 mcg by mouth every morning.   linezolid 600 MG tablet Commonly known as: ZYVOX Take 1 tablet (600 mg total) by mouth 2 (two) times daily for 13 days.   mesalamine 1.2 g EC tablet Commonly known as: LIALDA Take 2 tablets (2.4 g total) by mouth daily with breakfast.   metoprolol succinate 25 MG 24 hr tablet Commonly known as: TOPROL-XL Take 25 mg by mouth daily.   omeprazole 40 MG capsule Commonly known as: PRILOSEC Take 1 capsule (40 mg total) by mouth daily. NEED ANNUAL VISIT FOR FURTHER REFILLS What changed: additional instructions   ondansetron 4 MG tablet Commonly known as: ZOFRAN Take 1 tablet (4 mg total) by mouth every 6 (six) hours as needed for nausea.   rosuvastatin 10 MG tablet Commonly known as: CRESTOR Take 10 mg by mouth every evening.   TUMS PO Take 2 tablets by mouth as needed (reflux.).   VITAMIN B-12 PO Take 1 capsule by mouth daily.        Allergies  Allergen Reactions   Morphine And Related Itching and Rash    Given in IV.    Consultations: ID   Procedures/Studies: VAS [place] UPPER EXTREMITY VENOUS DUPLEX  Result Date: 11/15/2022 UPPER VENOUS STUDY  Patient Name:  [name]  Date of Exam:   11/15/2022 Medical Rec #: [id number]        Accession #:    [id number] Date of Birth: 1940-09-14        Patient Gender: F Patient Age:   16 years Exam Location:  [facility] Procedure:      VAS [place] UPPER EXTREMITY VENOUS DUPLEX Referring Phys: Gwynn Burly --------------------------------------------------------------------------------  Indications: Pain Risk Factors: Trauma. Comparison Study: No prior studies. Performing Technologist: Chanda Busing RVT  Examination Guidelines: A complete evaluation includes B-mode imaging, spectral Doppler, color Doppler, and power Doppler as  needed of all accessible portions of each vessel. Bilateral testing is considered an integral part of a complete examination. Limited examinations for reoccurring indications may be performed as noted.  Right Findings: +----------+------------+---------+-----------+----------+-------+ RIGHT     CompressiblePhasicitySpontaneousPropertiesSummary +----------+------------+---------+-----------+----------+-------+ Subclavian    Full       Yes       Yes                      +----------+------------+---------+-----------+----------+-------+  Left Findings: +----------+------------+---------+-----------+----------+-------+ LEFT      CompressiblePhasicitySpontaneousPropertiesSummary +----------+------------+---------+-----------+----------+-------+ IJV           Full       Yes       Yes                      +----------+------------+---------+-----------+----------+-------+ Subclavian    Full       Yes       Yes                      +----------+------------+---------+-----------+----------+-------+ Axillary  Full       Yes       Yes                      +----------+------------+---------+-----------+----------+-------+ Brachial      Full       Yes       Yes                      +----------+------------+---------+-----------+----------+-------+ Radial        Full                                          +----------+------------+---------+-----------+----------+-------+ Ulnar         Full                                          +----------+------------+---------+-----------+----------+-------+ Cephalic      None                                   Acute  +----------+------------+---------+-----------+----------+-------+ Basilic       Full                                          +----------+------------+---------+-----------+----------+-------+ Cephalic thrombus extends from the distal upper arm into the [facility].  Summary:  Right: No evidence of  thrombosis in the subclavian.  Left: No evidence of deep vein thrombosis in the upper extremity. Findings consistent with acute superficial vein thrombosis involving the left cephalic vein. Cephalic thrombus extends from the distal upper arm into the [facility].  *See table(s) above for measurements and observations.    Preliminary    DG CHEST PORT 1 VIEW  Result Date: 11/13/2022 CLINICAL DATA:  Fever EXAM: PORTABLE CHEST 1 VIEW COMPARISON:  Chest radiograph dated November 10, 2022 FINDINGS: The heart size and mediastinal contours are within normal limits. Low lung volumes with bibasilar hazy opacities suggesting atelectasis or small effusions. No acute osseous abnormality. IMPRESSION: Low lung volumes with bibasilar hazy opacities suggesting atelectasis or small effusions. Electronically Signed   By: Larose Hires D.O.   On: 11/13/2022 09:02   ECHOCARDIOGRAM COMPLETE  Result Date: 11/11/2022    ECHOCARDIOGRAM REPORT   Patient Name:   [name] Date of Exam: 11/11/2022 Medical Rec #:  [id number]       Height:       62.0 in Accession #:    [id number]      Weight:       140.0 lb Date of Birth:  [birth year]       BSA:          1.643 m Patient Age:    81 years        BP:           114/54 mmHg Patient Gender: F               HR:           91 bpm. Exam Location:  Inpatient Procedure: 2D Echo, Cardiac Doppler and Color Doppler Indications:     R94.31 Abnormal EKG  History:  Patient has prior history of Echocardiogram examinations, most                  recent 07/06/2022. TIA, Signs/Symptoms:Edema, Shortness of                  Breath, Dyspnea and Dizziness/Lightheadedness; Risk                  Factors:Hypertension.  Sonographer:     Sheralyn Boatman RDCS Referring Phys:  [id number] [facility] CUSTOVIC Diagnosing Phys: Rozell Searing Custovic IMPRESSIONS  1. Left ventricular ejection fraction, by estimation, is 55 to 60%. Left ventricular ejection fraction by 3D volume is 60 %. The left ventricle has normal function. The left ventricle has  no regional wall motion abnormalities. Left ventricular diastolic  parameters are consistent with Grade I diastolic dysfunction (impaired relaxation).  2. Right ventricular systolic function is normal. The right ventricular size is normal. There is moderately elevated pulmonary artery systolic pressure.  3. The mitral valve is grossly normal. Mild mitral valve regurgitation.  4. The aortic valve is calcified. Aortic valve regurgitation is not visualized. Aortic valve sclerosis/calcification is present, without any evidence of aortic stenosis. FINDINGS  Left Ventricle: Left ventricular ejection fraction, by estimation, is 55 to 60%. Left ventricular ejection fraction by 3D volume is 60 %. The left ventricle has normal function. The left ventricle has no regional wall motion abnormalities. The left ventricular internal cavity size was normal in size. There is no left ventricular hypertrophy. Left ventricular diastolic parameters are consistent with Grade I diastolic dysfunction (impaired relaxation). Right Ventricle: The right ventricular size is normal. No increase in right ventricular wall thickness. Right ventricular systolic function is normal. There is moderately elevated pulmonary artery systolic pressure. The tricuspid regurgitant velocity is 3.18 m/s, and with an assumed right atrial pressure of 15 mmHg, the estimated right ventricular systolic pressure is 55.4 mmHg. Left Atrium: Left atrial size was normal in size. Right Atrium: Right atrial size was normal in size. Pericardium: There is no evidence of pericardial effusion. Mitral Valve: The mitral valve is grossly normal. Mild mitral valve regurgitation. Tricuspid Valve: The tricuspid valve is grossly normal. Tricuspid valve regurgitation is mild. Aortic Valve: The aortic valve is calcified. Aortic valve regurgitation is not visualized. Aortic valve sclerosis/calcification is present, without any evidence of aortic stenosis. Pulmonic Valve: The pulmonic  valve was grossly normal. Pulmonic valve regurgitation is not visualized. Aorta: The aortic root is normal in size and structure. IAS/Shunts: No atrial level shunt detected by color flow Doppler.  LEFT VENTRICLE PLAX 2D LVIDd:         4.60 cm         Diastology LVIDs:         3.20 cm         LV e' medial:   8.05 cm/s LV PW:         0.80 cm         LV E/e' medial: 11.5 LV IVS:        1.30 cm LVOT diam:     2.00 cm LV SV:         67              3D Volume EF LV SV Index:   41              LV 3D EF:    Left LVOT Area:     3.14 cm  ventricul                                             ar                                             ejection                                             fraction                                             by 3D                                             volume is                                             60 %.                                 3D Volume EF:                                3D EF:        60 %                                LV EDV:       94 ml                                LV ESV:       37 ml                                LV SV:        56 ml RIGHT VENTRICLE             IVC RV S prime:     10.20 cm/s  IVC diam: 2.30 cm TAPSE (M-mode): 1.7 cm LEFT ATRIUM             Index        RIGHT ATRIUM          Index LA diam:        2.90 cm 1.77 cm/m   RA Area:     9.33 cm LA Vol (A2C):   60.8 ml 37.01 ml/m  RA Volume:   16.20 ml 9.86 ml/m LA Vol (A4C):   29.2 ml 17.77 ml/m LA Biplane Vol: 44.6 ml 27.15 ml/m  AORTIC VALVE LVOT Vmax:   110.00 cm/s LVOT Vmean:  77.100 cm/s LVOT VTI:    0.214 m  AORTA Ao Root diam: 2.90 cm Ao Asc diam:  3.00 cm MITRAL VALVE               TRICUSPID VALVE MV Area (PHT): 4.60 cm    TR Peak grad:   40.4 mmHg MV Decel Time: 165 msec    TR Vmax:        318.00 cm/s MV E velocity: 92.70 cm/s MV A velocity: 83.20 cm/s  SHUNTS MV E/A ratio:  1.11        Systemic VTI:  0.21 m                            Systemic Diam: 2.00 cm Rozell Searing Custovic  Electronically signed by Clotilde Dieter Signature Date/Time: 11/11/2022/2:53:01 PM    Final    DG Chest Portable 1 View  Result Date: 11/10/2022 CLINICAL DATA:  82 year old female with history of hypotension. EXAM: PORTABLE CHEST 1 VIEW COMPARISON:  Chest x-ray 10/17/2022. FINDINGS: Lung volumes are normal. No consolidative airspace disease. No pleural effusions. No pneumothorax. No pulmonary nodule or mass noted. Pulmonary vasculature and the cardiomediastinal silhouette are within normal limits. IMPRESSION: No radiographic evidence of acute cardiopulmonary disease. Electronically Signed   By: Trudie Reed M.D.   On: 11/10/2022 09:40   CT ABDOMEN PELVIS W CONTRAST  Result Date: 10/20/2022 CLINICAL DATA:  Complaint of rectal bleeding. Bright red blood per rectum this morning. History of Crohn's and ulcerative colitis. Abdominal pain. EXAM: CT ABDOMEN AND PELVIS WITH CONTRAST TECHNIQUE: Multidetector CT imaging of the abdomen and pelvis was performed using the standard protocol following bolus administration of intravenous contrast. RADIATION DOSE REDUCTION: This exam was performed according to the departmental dose-optimization program which includes automated exposure control, adjustment of the mA and/or kV according to patient size and/or use of iterative reconstruction technique. CONTRAST:  75mL OMNIPAQUE IOHEXOL 350 MG/ML SOLN COMPARISON:  CT 07/05/2022 FINDINGS: Lower chest: Small bilateral pleural effusions and associated atelectasis. Breast implants. Hepatobiliary: No focal liver abnormality is seen. No gallstones, gallbladder wall thickening, or biliary dilatation. Pancreas: Prominent main pancreatic duct measuring 3 mm in maximum diameter. This is new since July 05, 2022. No evidence of pancreatitis. Spleen: Unremarkable. Adrenals/Urinary Tract: Normal adrenal glands. No urinary calculi or hydronephrosis. Bilateral extrarenal pelvises. Unremarkable bladder. Stomach/Bowel: Marked wall  thickening and mucosal hyperenhancement of the transverse colon near the splenic flexure extending distally encompassing most of the descending colon. Adjacent mild stranding and engorgement of the vasa recta. Moderate stool burden upstream from the area of inflamed transverse/descending colon. No bowel dilation. Normal appendix. Mild wall thickening and mucosal hyperenhancement about the rectum and distal sigmoid colon. Focal oval hyperdensities in the small bowel (series 3/image 36 and 3/53) likely ingested tablets. No drainable fluid collection or abscess. No evidence of penetrating disease. Vascular/Lymphatic: Aortic atherosclerosis. No enlarged abdominal or pelvic lymph nodes. Reproductive: Hysterectomy. Other: Small amount of free fluid in the pelvis. No free intraperitoneal air. Musculoskeletal: Retrolisthesis of multiple lumbar vertebral bodies. Posterior fixation L4-L5 with interbody spacer. No acute osseous abnormality. IMPRESSION: 1. Wall thickening of the distal transverse and descending colon as well as the rectum consistent with inflammatory colitis/proctitis given history of inflammatory bowel disease. 2. Small bilateral pleural effusions and associated atelectasis. Aortic Atherosclerosis (ICD10-I70.0). Electronically Signed   By: Minerva Fester M.D.   On: 10/20/2022 20:02  DG Chest 2 View  Result Date: 10/17/2022 CLINICAL DATA:  Suspected sepsis. EXAM: CHEST - 2 VIEW COMPARISON:  Chest radiographs 07/05/2022 and 04/30/2022 FINDINGS: Interval removal of the prior left internal jugular central venous catheter. Interval removal of the prior enteric tube. Cardiac silhouette and mediastinal contours are within limits. The lungs are clear. No pleural effusion or pneumothorax. Mild-to-moderate multilevel degenerative disc changes of the thoracic spine. Mild levocurvature centered at T11. Partially calcified breast implants are again noted. IMPRESSION: 1. No radiographic evidence of pneumonia. 2.  Interval removal of the prior left internal jugular central venous catheter and enteric tube. Electronically Signed   By: Neita Garnet M.D.   On: 10/17/2022 12:24    Subjective: Feels better  Discharge Exam: Vitals:   11/15/22 1017 11/15/22 1402  BP: 106/65 110/66  Pulse: 84 93  Resp: 18 20  Temp:  98.1 F (36.7 C)  SpO2:  95%   Vitals:   11/14/22 2040 11/15/22 0443 11/15/22 1017 11/15/22 1402  BP: (!) 104/59 109/68 106/65 110/66  Pulse: (!) 104 97 84 93  Resp: 19 18 18 20   Temp: 99.6 F (37.6 C) 98.7 F (37.1 C)  98.1 F (36.7 C)  TempSrc: Oral Oral  Oral  SpO2: 94% 95%  95%  Weight:      Height:        General: Pt is alert, awake, not in acute distress Cardiovascular: RRR, S1/S2 +, no rubs, no gallops Respiratory: CTA bilaterally, no wheezing, no rhonchi Abdominal: Soft, NT, ND, bowel sounds + Extremities: no edema, no cyanosis    The results of significant diagnostics from this hospitalization (including imaging, microbiology, ancillary and laboratory) are listed below for reference.     Microbiology: Recent Results (from the past 240 hour(s))  SARS Coronavirus 2 by RT PCR (hospital order, performed in [facility] hospital lab) *cepheid single result test* Anterior Nasal Swab     Status: Abnormal   Collection Time: 11/10/22  9:17 AM   Specimen: Anterior Nasal Swab  Result Value Ref Range Status   SARS Coronavirus 2 by RT PCR POSITIVE (A) NEGATIVE Final    Comment: (NOTE) SARS-CoV-2 target nucleic acids are DETECTED  SARS-CoV-2 RNA is generally detectable in upper respiratory specimens  during the acute phase of infection.  Positive results are indicative  of the presence of the identified virus, but do not rule out bacterial infection or co-infection with other pathogens not detected by the test.  Clinical correlation with patient history and  other diagnostic information is necessary to determine patient infection status.  The expected result is  negative.  Fact Sheet for Patients:   RoadLapTop.co.za   Fact Sheet for Healthcare Providers:   http://kim-miller.com/    This test is not yet approved or cleared by the [place] FDA and  has been authorized for detection and/or diagnosis of SARS-CoV-2 by FDA under an Emergency Use Authorization (EUA).  This EUA will remain in effect (meaning this test can be used) for the duration of  the COVID-19 declaration under Section 564(b)(1)  of the Act, 21 U.S.C. section 360-bbb-3(b)(1), unless the authorization is terminated or revoked sooner.   Performed at [facility], [address], [place], [place] [address]   MRSA Next Gen by PCR, Nasal     Status: None   Collection Time: 11/10/22  5:17 PM   Specimen: Nasal Mucosa; Nasal Swab  Result Value Ref Range Status   MRSA by PCR Next Gen NOT DETECTED NOT DETECTED  Final    Comment: (NOTE) The GeneXpert MRSA Assay (FDA approved for NASAL specimens only), is one component of a comprehensive MRSA colonization surveillance program. It is not intended to diagnose MRSA infection nor to guide or monitor treatment for MRSA infections. Test performance is not FDA approved in patients less than 59 years old. Performed at [facility], [address], [place], [place] [address]   Culture, blood (Routine X 2) w Reflex to ID Panel     Status: None (Preliminary result)   Collection Time: 11/13/22  9:09 AM   Specimen: BLOOD LEFT ARM  Result Value Ref Range Status   Specimen Description   Final    BLOOD LEFT ARM Performed at [facility], [address], [place], [place] [address]    Special Requests   Final    BOTTLES DRAWN AEROBIC ONLY Blood Culture adequate volume Performed at [facility], [address], [place], [place] [address]    Culture   Final    NO GROWTH 2 DAYS Performed at [facility], [address] [place], [place] [address]    Report Status PENDING  Incomplete  Culture, blood (Routine X 2) w Reflex to ID Panel     Status: None (Preliminary result)   Collection Time: 11/13/22  9:09 AM   Specimen: BLOOD LEFT HAND  Result Value Ref Range Status   Specimen Description   Final    BLOOD LEFT HAND Performed at [facility], [address] [place], [place] [address]    Special Requests   Final    BOTTLES DRAWN AEROBIC ONLY Blood Culture adequate volume Performed at [facility], [address] [place], [place] [address]    Culture   Final    NO GROWTH 2 DAYS Performed at [facility], [address] [place], [place] [address]    Report Status PENDING  Incomplete  C Difficile Quick Screen (NO PCR Reflex)     Status: Abnormal   Collection Time: 11/15/22 12:00 PM   Specimen: STOOL  Result Value Ref Range Status   C Diff antigen POSITIVE (A) NEGATIVE Final   C Diff toxin NEGATIVE NEGATIVE Final   C Diff interpretation   Final    Results are indeterminate. Please contact the provider listed for your campus for C diff questions in AMION.    Comment: Performed at [facility], [address] [place], [place] [address]     Labs: BNP (last 3 results) Recent Labs    11/10/22 0911  BNP 303.1*   Basic Metabolic Panel: Recent Labs  Lab 11/11/22 0306 11/12/22 0309 11/12/22 0314 11/13/22 0438 11/14/22 0416 11/15/22 0417  NA 135  --  129* 131* 128* 129*  K 3.6  --  2.9* 4.1 4.6 4.5  CL 108  --  100 102 100 103  CO2 22  --  20* 23 21* 20*  GLUCOSE 90  --  107* 101* 122* 105*  BUN 8  --  5* 8 8 8   CREATININE 0.63  --  0.58 0.51 0.58 0.56  CALCIUM 7.6*  --  7.5* 7.7* 7.8* 7.7*  MG  --  1.7  --  2.1  --   --    Liver Function Tests: Recent Labs  Lab 11/10/22 0816 11/12/22 0314 11/13/22 0438 11/14/22 0416  AST 19 15 13* 13*  ALT 17 14 12 11   ALKPHOS 45 51 45 44  BILITOT 0.4 0.5 0.3 0.4  PROT 5.5* 5.1*  5.0* 5.0*  ALBUMIN 2.8* 2.3* 2.2* 2.2*   No results for input(s): "LIPASE", "AMYLASE" in the last 168 hours. No results for input(s): "AMMONIA" in the last 168 hours. CBC: Recent Labs  Lab 11/11/22 0306 11/12/22 0314 11/13/22 0438 11/13/22 0909 11/14/22 0416  WBC 5.9 7.6 5.4 7.1 9.3  NEUTROABS  --   --   --  4.1 5.9  HGB 7.3* 8.5* 8.6* 9.9* 8.6*  HCT 23.7* 27.2* 27.1* 32.7* 27.1*  MCV 100.9* 97.8 96.1 97.9 96.1  PLT 321 360 366 439* 426*   Cardiac Enzymes: No results for input(s): "CKTOTAL", "CKMB", "CKMBINDEX", "TROPONINI" in the last 168 hours. BNP: Invalid input(s): "POCBNP" CBG: Recent Labs  Lab 11/10/22 0902  GLUCAP 111*   D-Dimer No results for input(s): "DDIMER" in the last 72 hours. Hgb A1c No results for input(s): "HGBA1C" in the last 72 hours. Lipid Profile No results for input(s): "CHOL", "HDL", "LDLCALC", "TRIG", "CHOLHDL", "LDLDIRECT" in the last 72 hours. Thyroid function studies No results for input(s): "TSH", "T4TOTAL", "T3FREE", "THYROIDAB" in the last 72 hours.  Invalid input(s): "FREET3" Anemia work up No results for input(s): "VITAMINB12", "FOLATE", "FERRITIN", "TIBC", "IRON", "RETICCTPCT" in the last 72 hours. Urinalysis    Component Value Date/Time   COLORURINE YELLOW 11/10/2022 0940   APPEARANCEUR CLEAR 11/10/2022 0940   LABSPEC 1.005 11/10/2022 0940   PHURINE 6.0 11/10/2022 0940   GLUCOSEU NEGATIVE 11/10/2022 0940   GLUCOSEU NEGATIVE 05/06/2016 1708   HGBUR NEGATIVE 11/10/2022 0940   BILIRUBINUR NEGATIVE 11/10/2022 0940   BILIRUBINUR negative 06/17/2022 1429   KETONESUR NEGATIVE 11/10/2022 0940   PROTEINUR NEGATIVE 11/10/2022 0940   UROBILINOGEN 0.2 06/17/2022 1429   UROBILINOGEN 0.2 05/06/2016 1708   NITRITE NEGATIVE 11/10/2022 0940   LEUKOCYTESUR MODERATE (A) 11/10/2022 0940   Sepsis Labs Recent Labs  Lab 11/12/22 0314 11/13/22 0438 11/13/22 0909 11/14/22 0416  WBC 7.6 5.4 7.1 9.3   Microbiology Recent Results (from the  past 240 hour(s))  SARS Coronavirus 2 by RT PCR (hospital order, performed in [facility] hospital lab) *cepheid single result test* Anterior Nasal Swab     Status: Abnormal   Collection Time: 11/10/22  9:17 AM   Specimen: Anterior Nasal Swab  Result Value Ref Range Status   SARS Coronavirus 2 by RT PCR POSITIVE (A) NEGATIVE Final    Comment: (NOTE) SARS-CoV-2 target nucleic acids are DETECTED  SARS-CoV-2 RNA is generally detectable in upper respiratory specimens  during the acute phase of infection.  Positive results are indicative  of the presence of the identified virus, but do not rule out bacterial infection or co-infection with other pathogens not detected by the test.  Clinical correlation with patient history and  other diagnostic information is necessary to determine patient infection status.  The expected result is negative.  Fact Sheet for Patients:   RoadLapTop.co.za   Fact Sheet for Healthcare Providers:   http://kim-miller.com/    This test is not yet approved or cleared by the [place] FDA and  has been authorized for detection and/or diagnosis of SARS-CoV-2 by FDA under an Emergency Use Authorization (EUA).  This EUA will remain in effect (meaning this test can be used) for the duration of  the COVID-19 declaration under Section 564(b)(1)  of the Act, 21 U.S.C. section 360-bbb-3(b)(1), unless the authorization is terminated or revoked sooner.   Performed at [facility], [address] [place], [place] [address]   MRSA Next  Gen by PCR, Nasal     Status: None   Collection Time: 11/10/22  5:17 PM   Specimen: Nasal Mucosa; Nasal Swab  Result Value Ref Range Status   MRSA by PCR Next Gen NOT DETECTED NOT DETECTED Final    Comment: (NOTE) The GeneXpert MRSA Assay (FDA approved for NASAL specimens only), is one component of a comprehensive MRSA colonization surveillance program. It is not  intended to diagnose MRSA infection nor to guide or monitor treatment for MRSA infections. Test performance is not FDA approved in patients less than 59 years old. Performed at [facility], [address] [place], [place] [address]   Culture, blood (Routine X 2) w Reflex to ID Panel     Status: None (Preliminary result)   Collection Time: 11/13/22  9:09 AM   Specimen: BLOOD LEFT ARM  Result Value Ref Range Status   Specimen Description   Final    BLOOD LEFT ARM Performed at [facility], [address], [place], [place] [address]    Special Requests   Final    BOTTLES DRAWN AEROBIC ONLY Blood Culture adequate volume Performed at [facility], [address] [place], [place] [address]    Culture   Final    NO GROWTH 2 DAYS Performed at [facility], [address] [place], [place] [address]    Report Status PENDING  Incomplete  Culture, blood (Routine X 2) w Reflex to ID Panel     Status: None (Preliminary result)   Collection Time: 11/13/22  9:09 AM   Specimen: BLOOD LEFT HAND  Result Value Ref Range Status   Specimen Description   Final    BLOOD LEFT HAND Performed at [facility], [address] [place], [place] [address]    Special Requests   Final    BOTTLES DRAWN AEROBIC ONLY Blood Culture adequate volume Performed at [facility], [address], [place], [place] [address]    Culture   Final    NO GROWTH 2 DAYS Performed at [facility], [address] [place], [place] [address]    Report Status PENDING  Incomplete  C Difficile Quick Screen (NO PCR Reflex)     Status: Abnormal   Collection Time: 11/15/22 12:00 PM   Specimen: STOOL  Result Value Ref Range Status   C Diff antigen POSITIVE (A) NEGATIVE Final   C Diff toxin NEGATIVE NEGATIVE Final   C Diff interpretation   Final    Results are indeterminate. Please contact the provider listed for your  campus for C diff questions in AMION.    Comment: Performed at [facility], [address] [place], [place] [address]    SIGNED:   Marinda Elk, MD  [facility] 11/15/2022, 2:40 PM Pager   If 7PM-7AM, please contact night-coverage [email or web address] Password [email or web address]

## 2022-11-15 NOTE — Progress Notes (Signed)
Physical Therapy Treatment Patient Details Name: [name] MRN: [id number] DOB: [birth year] Today's Date: 11/15/2022   History of Present Illness [name] is a 82 y.o. female with medical history significant for essential hypertension, inflammatory bowel disease, hypothyroidism, fibromyalgia, recent hospitalization for syncope, ex lap and ex lap and part of her intestine was removed.  After this, recovery was complicated by C. difficile  recently had COVID.To ER3/31/24  for evaluation of low BP, weak, lethargic and some syncope but denies any falls.    PT Comments    The patient is eager to return home. [facility] and [facility].  Patient with imoproved mobility.    Recommendations for follow up therapy are one component of a multi-disciplinary discharge planning process, led by the attending physician.  Recommendations may be updated based on patient status, additional functional criteria and insurance authorization.  Follow Up Recommendations       Assistance Recommended at Discharge Frequent or constant Supervision/Assistance  Patient can return home with the following A little help with walking and/or transfers;A little help with bathing/dressing/bathroom;Assistance with cooking/housework;Help with stairs or ramp for entrance;Assist for transportation   Equipment Recommendations  None recommended by PT    Recommendations for Other Services       Precautions / Restrictions Precautions Precautions: Fall     Mobility  Bed Mobility   Bed Mobility: Supine to Sit     Supine to sit: Min assist     General bed mobility comments: gentle support to sit up    Transfers Overall transfer level: Needs assistance   Transfers: Sit to/from Stand, Bed to chair/wheelchair/BSC Sit to Stand: Min assist   Step pivot transfers: Min assist       General transfer comment: steady [facility] then [facility]    Ambulation/Gait                   Stairs              Wheelchair Mobility    Modified Rankin (Stroke Patients Only)       Balance   Sitting-balance support: No upper extremity supported, Feet supported Sitting balance-Leahy Scale: Fair     Standing balance support: Single extremity supported, During functional activity Standing balance-Leahy Scale: Poor                              Cognition Arousal/Alertness: Awake/alert Behavior During Therapy: WFL for tasks assessed/performed                                            Exercises      General Comments        Pertinent Vitals/Pain Pain Assessment Pain Assessment: No/denies pain    Home Living                          Prior Function            PT Goals (current goals can now be found in the care plan section) Progress towards PT goals: Progressing toward goals    Frequency    Min 3X/week      PT Plan Current plan remains appropriate    Co-evaluation              AM-PAC PT "  6 Clicks" Mobility   Outcome Measure  Help needed turning from your back to your side while in a flat bed without using bedrails?: A Little Help needed moving from lying on your back to sitting on the side of a flat bed without using bedrails?: A Little Help needed moving to and from a bed to a chair (including a wheelchair)?: A Little Help needed standing up from a chair using your arms (e.g., wheelchair or bedside chair)?: A Little Help needed to walk in hospital room?: A Lot Help needed climbing 3-5 steps with a railing? : Total 6 Click Score: 15    End of Session   Activity Tolerance: Patient tolerated treatment well Patient left: in chair;with nursing/sitter in room Nurse Communication: Mobility status PT Visit Diagnosis: Unsteadiness on feet (R26.81);Difficulty in walking, not elsewhere classified (R26.2)     Time: 3300-7622 PT Time Calculation (min) (ACUTE ONLY): 20 min  Charges:  $Therapeutic Activity:  8-22 mins                     [name] PT Acute Rehabilitation Services Office [phone] Weekend [phone]     Rada Hay 11/15/2022, 4:20 PM

## 2022-11-15 NOTE — Care Management (Signed)
This RNCM faxed home health orders to [facility].

## 2022-11-16 LAB — URINE CULTURE: Culture: <10000 — AB

## 2022-11-18 ENCOUNTER — Encounter: Payer: Self-pay | Admitting: Internal Medicine

## 2022-11-18 LAB — CULTURE, BLOOD (ROUTINE X 2)
Culture: NO GROWTH
Culture: NO GROWTH
Special Requests: ADEQUATE
Special Requests: ADEQUATE

## 2022-11-18 MED ORDER — FUROSEMIDE 20 MG PO TABS: 10.0000 mg | ORAL_TABLET | Freq: Every day | ORAL | 2 refills | Status: DC

## 2022-11-19 ENCOUNTER — Telehealth: Payer: Self-pay

## 2022-11-19 ENCOUNTER — Ambulatory Visit: Payer: Medicare Other | Admitting: Internal Medicine

## 2022-11-19 NOTE — Telephone Encounter (Signed)
Patient home blood pressure remains soft at baseline. Overall she averages as 105/64.  Blood pressure and heart rate 11/18/22 9:29 PM 122 / 78 91     11/17/22 9:15 PM 124 / 78 92     11/17/22 9:30 AM 109 / 58 92     11/16/22 9:10 PM 104 / 68 90     11/16/22 12:20 PM 105 / 68 97  11/10/22 5:33 AM 93 / 51 90     11/09/22 12:04 PM 94 / 57 87     11/09/22 8:11 AM 102 / 73 88     11/08/22 9:03 PM 113 / 63 110     11/08/22 9:02 PM 112 / 64 110     11/08/22 6:44 AM 105 / 64 86     11/07/22 9:29 PM 105 / 61 84     11/07/22 8:37 AM 102 / 62 86     11/07/22 8:36 AM 101 / 63 87

## 2022-11-20 ENCOUNTER — Ambulatory Visit (INDEPENDENT_AMBULATORY_CARE_PROVIDER_SITE_OTHER): Payer: Medicare Other | Admitting: Gastroenterology

## 2022-11-20 ENCOUNTER — Telehealth: Payer: Self-pay

## 2022-11-20 ENCOUNTER — Encounter: Payer: Self-pay | Admitting: Gastroenterology

## 2022-11-20 VITALS — BP 110/60 | HR 93 | Ht 62.0 in | Wt 135.0 lb

## 2022-11-20 DIAGNOSIS — D649 Anemia, unspecified: Secondary | ICD-10-CM

## 2022-11-20 DIAGNOSIS — K529 Noninfective gastroenteritis and colitis, unspecified: Secondary | ICD-10-CM | POA: Diagnosis not present

## 2022-11-20 DIAGNOSIS — K50111 Crohn's disease of large intestine with rectal bleeding: Secondary | ICD-10-CM

## 2022-11-20 DIAGNOSIS — A0472 Enterocolitis due to Clostridium difficile, not specified as recurrent: Secondary | ICD-10-CM | POA: Diagnosis not present

## 2022-11-20 MED ORDER — MESALAMINE 1.2 G PO TBEC
2.4000 g | DELAYED_RELEASE_TABLET | Freq: Every day | ORAL | 1 refills | Status: DC
Start: 2022-11-20 — End: 2022-11-22

## 2022-11-20 NOTE — Progress Notes (Signed)
HPI :  82 year old female accompanied by her daughter today for history of colitis and C. difficile.  See prior notes for details of her case.  She was previously followed by Dr. Marina GoodellPerry however I saw her in the hospital for colitis in January, she asked me to assume her GI care moving forward.  Recall she had a prolonged hospitalization in November for sepsis.  She had some thickening of her colon on CT scan at that time with concern for ischemic bowel.  Actually underwent an ex lap with general surgery which showed no evidence of ischemia.  Was diagnosed with infectious colitis and discharged to rehab.  She returned in January with worsening diarrhea, weight loss and a reported history of C. difficile.  At that time she was treated with vancomycin without any improvement.  We saw her in January, she had repeat stool testing which was negative for C. difficile, had a flex sig done which showed severe inflammation of her sigmoid and left colon through transverse colon, right colon was not evaluated.  Biopsies were suggestive of Crohn's disease.  She was started on steroids in the hospital and her symptoms significantly improved.  She was transition to prednisone and Lialda and discharged.  She unfortunately had some recurrent diarrhea in mid March.  Initially seen in the ED and was given some prednisone.  I had to go to the lab for stool testing and she was positive for C. difficile.  She was treated with a course of vancomycin and improved.  Unfortunately developed recurrent symptoms and was hospitalized from March 31 April 5 with recurrent C. difficile.  She also had a cellulitis and was on other antibiotics for that.  Infectious disease was consulted.  She was started on Dificid and linezolid and discharged in stable condition with improvement of her symptoms.  She continued Lialda, was not on any steroids.  She reports her stools have formed up and she is no longer having diarrhea.  No abdominal pain.   No rectal bleeding.  She is doing better in regards to her bowels.  Her main symptom is fatigue.  She was noted to have anemia with hemoglobins in the low hospitalized.  She is very fatigued but otherwise stable at this time.       Previous GI Evaluation    08/30/21 sigmoidoscopy  -Poor bowel preparation.  Diffuse, severe inflammation found in the sigmoid colon, in the descending colon, and in the transverse colon.  Biopsied.  Internal hemorrhoids.  Normal rectosigmoid/rectum.  Right colon not evaluated due to severe inflammatory changes   Path  A. COLON, TRANSVERSE, BIOPSY:  Ulcer with granulation tissue and exudate.  Colonic fragments with focally altered crypt architecture.  Differential diagnosis includes Crohn's.  No granulomas identified.      Imaging  CT angio chest / abd / pelvis 07/05/22  No evidence of thoracic or abdominal aortic dissection or aneurysm. Moderate to severe dilatation of the colon is noted with large amount of stool seen throughout the colon. There appears to be somedegree of wall thickening involving the sigmoid colon suggesting infectious or inflammatory colitis. Moderate size sliding-type hiatal hernia. Mild bibasilar subsegmental atelectasis.     Past Medical History:  Diagnosis Date   C. difficile colitis    Cancer 2014   kidney   COVID    Esophageal stricture    Fibromyalgia    GERD (gastroesophageal reflux disease)    Helicobacter pylori gastritis    Hypertension    Intention tremor  Lumbago    Memory loss    Osteoarthritis of spine    Pneumonia    hx of years ago    PUD (peptic ulcer disease)    PVD (peripheral vascular disease)      Past Surgical History:  Procedure Laterality Date   ABDOMINAL HYSTERECTOMY  1985   BIOPSY  08/30/2022   Procedure: BIOPSY;  Surgeon: Benancio Deeds, MD;  Location: [facility] ENDOSCOPY;  Service: Gastroenterology;;   BREAST ENHANCEMENT SURGERY  1974   CATARACT EXTRACTION Bilateral 2014   shapiro    COLONOSCOPY WITH PROPOFOL N/A 08/30/2022   Procedure: COLONOSCOPY WITH PROPOFOL;  Surgeon: Benancio Deeds, MD;  Location: [facility] ENDOSCOPY;  Service: Gastroenterology;  Laterality: N/A;   LAPAROSCOPIC PARTIAL NEPHRECTOMY Left April '13   [facility], Dr. Laverle Patter   LAPAROTOMY N/A 07/05/2022   Procedure: EXPLORATORY LAPAROTOMY;  Surgeon: Andria Meuse, MD;  Location: [facility] OR;  Service: General;  Laterality: N/A;   LUMBAR FUSION  03/2003   L4-5   TONSILLECTOMY  1958   TUBAL LIGATION  1984   Family History  Problem Relation Age of Onset   Heart disease Mother    COPD Mother    Thyroid disease Mother    Diabetes Father    Heart disease Father        CABG   Stroke Father    Parkinson's disease Father    Heart disease Brother    Stroke Brother    Hyperlipidemia Brother    Diabetes Daughter    Hearing loss Brother    Crohn's disease Other        neice   Hypertension Son    Diabetes Son    Melanoma Son    Colon cancer Neg Hx    Stomach cancer Neg Hx    Esophageal cancer Neg Hx    Social History   Tobacco Use   Smoking status: Never   Smokeless tobacco: Never  Vaping Use   Vaping Use: Never used  Substance Use Topics   Alcohol use: Not Currently    Alcohol/week: 7.0 standard drinks of alcohol    Types: 7 Glasses of wine per week    Comment: Patient no longer drinks alcohol due to her Chrohn's disease   Drug use: No   Current Outpatient Medications  Medication Sig Dispense Refill   acetaminophen (TYLENOL) 325 MG tablet Take 650 mg by mouth 2 (two) times daily as needed for mild pain or headache.     ALPRAZolam (XANAX) 0.5 MG tablet Take 1 tablet (0.5 mg total) by mouth 2 (two) times daily as needed. for anxiety (Patient taking differently: Take 0.5 mg by mouth at bedtime. for anxiety) 5 tablet 1   Calcium Carbonate Antacid (TUMS PO) Take 2 tablets by mouth as needed (reflux.).     Carboxymethylcellulose Sodium (EYE DROPS OP) Place 2 drops into both eyes in the morning and at  bedtime.     Cyanocobalamin (VITAMIN B-12 PO) Take 1 capsule by mouth daily.     DULoxetine (CYMBALTA) 60 MG capsule Take 60 mg by mouth daily.     FIBER PO Take 1 tablet by mouth daily.     furosemide (LASIX) 20 MG tablet Take 0.5 tablets (10 mg total) by mouth daily. 15 tablet 2   levothyroxine (SYNTHROID) 88 MCG tablet Take 88 mcg by mouth every morning.     linezolid (ZYVOX) 600 MG tablet Take 1 tablet (600 mg total) by mouth 2 (two) times daily for 13 days. 26  tablet 0   mesalamine (LIALDA) 1.2 g EC tablet Take 2 tablets (2.4 g total) by mouth daily with breakfast. 60 tablet 3   metoprolol succinate (TOPROL-XL) 25 MG 24 hr tablet Take 25 mg by mouth daily.     omeprazole (PRILOSEC) 40 MG capsule Take 1 capsule (40 mg total) by mouth daily. NEED ANNUAL VISIT FOR FURTHER REFILLS (Patient taking differently: Take 40 mg by mouth daily.) 90 capsule 0   ondansetron (ZOFRAN) 4 MG tablet Take 1 tablet (4 mg total) by mouth every 6 (six) hours as needed for nausea. 20 tablet 0   rosuvastatin (CRESTOR) 10 MG tablet Take 10 mg by mouth every evening.     No current facility-administered medications for this visit.   Allergies  Allergen Reactions   Morphine And Related Itching and Rash    Given in IV.     Review of Systems: All systems reviewed and negative except where noted in HPI.   Lab Results  Component Value Date   WBC 9.3 11/14/2022   HGB 8.6 (L) 11/14/2022   HCT 27.1 (L) 11/14/2022   MCV 96.1 11/14/2022   PLT 426 (H) 11/14/2022    Lab Results  Component Value Date   CREATININE 0.56 11/15/2022   BUN 8 11/15/2022   NA 129 (L) 11/15/2022   K 4.5 11/15/2022   CL 103 11/15/2022   CO2 20 (L) 11/15/2022    Lab Results  Component Value Date   ALT 11 11/14/2022   AST 13 (L) 11/14/2022   ALKPHOS 44 11/14/2022   BILITOT 0.4 11/14/2022     Physical Exam: BP 110/60   Pulse 93   Ht  (1.575 m)   Wt 135 lb (61.2 kg)   BMI 24.69 kg/m  Constitutional:  Pleasant,well-developed, female in no acute distress. Neurological: Alert and oriented to person place and time. Psychiatric: Normal mood and affect. Behavior is normal.   ASSESSMENT: 82 y.o. female here for assessment of the following  1. IBD (inflammatory bowel disease)   2. Enteritis due to Clostridium difficile   3. Anemia, unspecified type    History as outlined above, suspected underlying Crohn's disease in recent months.  She had tested negative for C. difficile while hospitalized with severe symptoms, responded quite well to steroids.  Had been on courses of other antibiotics, developed cellulitis, ultimately developed C. difficile in March and then again recurrence more recently.  Seen by ID in the hospital, transition to linezolid and Dificid and doing much better, discharged in stable condition.  She is being maintained on Lialda.  Regarding her C. difficile, she had multiple risk factors for this including prolonged hospital stay, antibiotic use, her underlying suspected Crohn's disease.  Hopefully she does well after being treated with Dificid and linezolid.  She understands she is at risk for recurrence.  If she has another recurrence of symptoms would have a very low threshold to resume treatment for her empirically and consideration for giving her Vowst to prevent recurrent episodes.  She will keep a close eye on things and contact me if symptoms recur.  Otherwise we discussed her IBD suspected Crohn's disease.  Initially managing this with Lialda to see if she responds to this.  We discussed that if she is negative for C. difficile but colitis remains active then we may need to escalate to a biologic therapy.  Given her age I would normally consider Entyvio for her case like this, given its favorable safety profile, however counseled her that  main risk for Entyvio is C. difficile.  I will empirically increase her Lialda to 2 tabs twice daily, her renal function is normal and  tolerating this.  I will see her back in the office in 1 month.  Consideration for fecal calprotectin at that time versus flex sig to monitor response to therapy.  Hopefully we can avoid escalation of therapy to biologic but time will tell.  We discussed that goals of therapy are mainly to control her symptoms and keep her functional, but also to reduce her risk for colon cancer.  Hopefully risk for colon cancer at her age at this point would remain low.  She is anemic from prolonged hospital stay and her colitis, I think causing her fatigue.  Hopefully with time this improves.  Recommend multivitamin with extra iron.  I think this will get better with time as long as her colitis is controlled.  PLAN: - complete antibiotic course for C diff - call with any recurrence of C diff symptoms - increase Lialda to 2 tabs BID - f/u in office in 1 month - consider fecal calpro vs. Flex sig in upcoming months - hopefully can avoid escalation of therapy for her colitis, but if she recurs would consider Entyvio if C Diff eradicated / stable  Harlin Rain, MD [facility]

## 2022-11-20 NOTE — Patient Instructions (Addendum)
Continue course of antibiotics.  Increase Lialda to 2 tablets once a day. We sent a new prescription to your pharmacy.  You have been scheduled for a follow up appointment on Wednesday, 01-15-23 at 4:00pm. Please arrive 10 minutes early for registration. If you need to reschedule, please call [phone] as soon as possible.   Thank you for entrusting me with your care and for choosing [facility], Dr. Ileene Patrick    If your blood pressure at your visit was 140/90 or greater, please contact your primary care physician to follow up on this.  _______________________________________________________  If you are age 6 or older, your body mass index should be between 23-30. Your Body mass index is 24.69 kg/m. If this is out of the aforementioned range listed, please consider follow up with your Primary Care Provider.  If you are age 48 or younger, your body mass index should be between 19-25. Your Body mass index is 24.69 kg/m. If this is out of the aformentioned range listed, please consider follow up with your Primary Care Provider.   ________________________________________________________  The [facility] providers would like to encourage you to use [facility] to communicate with providers for non-urgent requests or questions.  Due to long hold times on the telephone, sending your provider a message by [facility] may be a faster and more efficient way to get a response.  Please allow 48 business hours for a response.  Please remember that this is for non-urgent requests.  _______________________________________________________  Due to recent changes in healthcare laws, you may see the results of your imaging and laboratory studies on MyChart before your provider has had a chance to review them.  We understand that in some cases there may be results that are confusing or concerning to you. Not all laboratory results come back in the same time frame and the provider may be waiting for  multiple results in order to interpret others.  Please give [place] 48 hours in order for your provider to thoroughly review all the results before contacting the office for clarification of your results.

## 2022-11-20 NOTE — Telephone Encounter (Signed)
Patient called about her Lasix and she would like for you to give her a call back

## 2022-11-22 ENCOUNTER — Other Ambulatory Visit: Payer: Self-pay | Admitting: Gastroenterology

## 2022-11-22 DIAGNOSIS — K50111 Crohn's disease of large intestine with rectal bleeding: Secondary | ICD-10-CM

## 2022-11-22 NOTE — Progress Notes (Signed)
Patient will call and reschedule

## 2022-11-28 ENCOUNTER — Telehealth: Payer: Self-pay | Admitting: Gastroenterology

## 2022-11-28 NOTE — Telephone Encounter (Signed)
Patient called states she has been experiencing a lot of nausea and some vomiting for about 3 days now. Seeking advise.

## 2022-11-28 NOTE — Telephone Encounter (Signed)
[place] I was off this afternoon out of the office and just happened to see this now. She should use Zofran and we can give her that for nausea.  ODT every 6 hours PRN #30  Agree if she can't tolerate PO And getting dehydrated she would need to go to the ED. She was supposed to complete course of antibiotics for her C diff. Any diarrhea that bothers her, abdominal pain, or fever? Only nausea?

## 2022-11-28 NOTE — Telephone Encounter (Signed)
Returned call to patient. Pt reports that this is her 3rd day of being nauseous and vomiting. Typically vomits clear liquid around 10 am. Patient states that she can only drink liquids, but she does not feel like she is drinking enough water. Patient denies any fever, abdominal pain/cramping, new medications, or new foods. Patient states that she can't eat solid foods because she is too nauseous to get it down. Zofran is listed on patient's med list but she does not recall having this. I informed patient that she may have to go to the ER for evaluation if she is not able to maintain hydration or keep anything down. Patient states that her stools are soft/formed, no issues there. Please advise, thanks.

## 2022-11-29 MED ORDER — ONDANSETRON 4 MG PO TBDP: 4.0000 mg | ORAL_TABLET | Freq: Four times a day (QID) | ORAL | 0 refills | Status: DC | PRN

## 2022-11-29 NOTE — Telephone Encounter (Signed)
Called and spoke with patient regarding recommendations. Pt would like Zofran sent to [facility]. Patient states that she did complete antibiotics for C. Diff, her stools are formed. No abdominal pain or fever. Only nausea, and intermittent vomiting. I again advised patient that if she is not able to tolerate PO she will need to go to ED for evaluation for possible dehydration. Pt verbalized understanding and had no concerns at the end of the call.

## 2022-11-29 NOTE — Telephone Encounter (Signed)
PT is calling to get an update on prescription for nausea and vomiting. Please advise.

## 2022-11-30 ENCOUNTER — Ambulatory Visit
Admission: EM | Admit: 2022-11-30 | Discharge: 2022-11-30 | Disposition: A | Discharge status: Home / Self Care | Payer: Medicare Other | Attending: Internal Medicine | Admitting: Internal Medicine

## 2022-11-30 DIAGNOSIS — N3000 Acute cystitis without hematuria: Secondary | ICD-10-CM | POA: Diagnosis present

## 2022-11-30 LAB — POCT URINALYSIS DIP (MANUAL ENTRY)
Bilirubin, UA: NEGATIVE
Blood, UA: NEGATIVE
Glucose, UA: NEGATIVE mg/dL
Ketones, POC UA: NEGATIVE mg/dL
Nitrite, UA: POSITIVE — AB
Protein Ur, POC: NEGATIVE mg/dL
Spec Grav, UA: 1.02 (ref 1.010–1.025)
Urobilinogen, UA: 0.2 E.U./dL
pH, UA: 6 (ref 5.0–8.0)

## 2022-11-30 MED ORDER — CEPHALEXIN 500 MG PO CAPS: 500.0000 mg | ORAL_CAPSULE | Freq: Two times a day (BID) | ORAL | 0 refills | Status: AC

## 2022-11-30 MED ORDER — FLUCONAZOLE 150 MG PO TABS: 150.0000 mg | ORAL_TABLET | Freq: Once | ORAL | 0 refills | Status: AC

## 2022-11-30 NOTE — ED Provider Notes (Signed)
[facility]    CSN: [id number] Arrival date & time: 11/30/22  0946      History   Chief Complaint Chief Complaint  Patient presents with   urinary issues    HPI [name] is a 82 y.o. female with a history of Crohn's disease comes to urgent care with few days history of burning on urination, frequency of urination and vaginal burning.  Patient's symptoms has been persistent.  She denies any fever or chills or flank pain.  She endorses nausea with no vomiting.  Her bowel movements are watery at times.  No vaginal discharge or perineal erythema.  Patient is not sexually active.  No abdominal pain.  HPI  Past Medical History:  Diagnosis Date   C. difficile colitis    Cancer 2014   kidney   COVID    Esophageal stricture    Fibromyalgia    GERD (gastroesophageal reflux disease)    Helicobacter pylori gastritis    Hypertension    Intention tremor    Lumbago    Memory loss    Osteoarthritis of spine    Pneumonia    hx of years ago    PUD (peptic ulcer disease)    PVD (peripheral vascular disease)     Patient Active Problem List   Diagnosis Date Noted   Cellulitis of left upper extremity 11/14/2022   C. 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   Hypoalbuminemia    Diarrhea 08/28/2022   Abnormal gait 07/23/2022   Severe protein-calorie malnutrition 07/10/2022   Hypovolemia 07/05/2022   Atrioventricular block, Mobitz type 1, 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   Dizziness 06/27/2022   Prolonged Q-T interval on ECG 06/27/2022   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   TIA (transient ischemic attack)  04/30/2022   Acute respiratory failure with hypoxia 05/11/2020   Right upper lobe pneumonia 05/11/2020   Hyponatremia 05/11/2020   GERD (gastroesophageal reflux disease)    Anxiety with depression    Anxiety 11/14/2016   Swelling 06/18/2016   Hypothyroidism 05/17/2016   Abnormal CT scan 12/28/2015   Lumbar radiculopathy 06/13/2015   Chronic low back pain 04/13/2015   Cervical disc disorder with radiculopathy of cervical region 06/20/2014   Bursitis of right shoulder 05/04/2014   Insomnia 04/21/2013   Routine health maintenance 01/28/2013   Essential hypertension 01/09/2012   Memory loss or impairment 12/02/2010   Fibromyalgia 05/04/2007    Past Surgical History:  Procedure Laterality Date   ABDOMINAL HYSTERECTOMY  1985   BIOPSY  08/30/2022   Procedure: BIOPSY;  Surgeon: Benancio Deeds, MD;  Location: [facility];  Service: Gastroenterology;;   BREAST ENHANCEMENT SURGERY  1974   CATARACT EXTRACTION Bilateral 2014   shapiro   COLONOSCOPY WITH PROPOFOL N/A 08/30/2022   Procedure: COLONOSCOPY WITH PROPOFOL;  Surgeon: Benancio Deeds, MD;  Location: [facility] ENDOSCOPY;  Service: Gastroenterology;  Laterality: N/A;   LAPAROSCOPIC PARTIAL NEPHRECTOMY Left April '13   [facility], Dr. Laverle Patter   LAPAROTOMY N/A 07/05/2022   Procedure: EXPLORATORY LAPAROTOMY;  Surgeon: Andria Meuse, MD;  Location: [facility] OR;  Service: General;  Laterality: N/A;   LUMBAR FUSION  03/2003   L4-5   TONSILLECTOMY  1958  TUBAL LIGATION  1984    OB History   No obstetric history on file.      Home Medications    Prior to Admission medications   Medication Sig Start Date End Date Taking? Authorizing Provider  cephALEXin (KEFLEX) 500 MG capsule Take 1 capsule (500 mg total) by mouth 2 (two) times daily for 5 days. 11/30/22 12/05/22 Yes Tammala Weider, Britta Mccreedy, MD  fluconazole (DIFLUCAN) 150 MG tablet Take 1 tablet (150 mg total) by mouth once for 1 dose. 11/30/22 11/30/22 Yes Shacara Cozine, Britta Mccreedy, MD   acetaminophen (TYLENOL) 325 MG tablet Take 650 mg by mouth 2 (two) times daily as needed for mild pain or headache.    [provider]  ALPRAZolam [name]) 0.5 MG tablet Take 1 tablet (0.5 mg total) by mouth 2 (two) times daily as needed. for anxiety Patient taking differently: Take 0.5 mg by mouth at bedtime. for anxiety 07/16/22   Marinda Elk, MD  Calcium Carbonate Antacid (TUMS PO) Take 2 tablets by mouth as needed (reflux.).    [provider]  Carboxymethylcellulose Sodium (EYE DROPS OP) Place 2 drops into both eyes in the morning and at bedtime.    [provider]  Cyanocobalamin (VITAMIN B-12 PO) Take 1 capsule by mouth daily.    [provider]  DULoxetine (CYMBALTA) 60 MG capsule Take 60 mg by mouth daily.    [provider]  FIBER PO Take 1 tablet by mouth daily.    [provider]  furosemide (LASIX) 20 MG tablet Take 0.5 tablets (10 mg total) by mouth daily. 11/18/22 02/16/23  Custovic, Rozell Searing, DO  levothyroxine (SYNTHROID) 88 MCG tablet Take 88 mcg by mouth every morning. 09/13/22   [provider]  mesalamine (LIALDA) 1.2 g EC tablet Take 2 tablets (2.4 g total) by mouth in the morning and at bedtime. 11/22/22   Armbruster, Willaim Rayas, MD  metoprolol succinate (TOPROL-XL) 25 MG 24 hr tablet Take 25 mg by mouth daily.    [provider]  omeprazole (PRILOSEC) 40 MG capsule Take 1 capsule (40 mg total) by mouth daily. NEED ANNUAL VISIT FOR FURTHER REFILLS Patient taking differently: Take 40 mg by mouth daily. 04/29/17   Myrlene Broker, MD  ondansetron (ZOFRAN-ODT) 4 MG disintegrating tablet Take 1 tablet (4 mg total) by mouth every 6 (six) hours as needed for nausea or vomiting. 11/29/22   Armbruster, Willaim Rayas, MD  rosuvastatin (CRESTOR) 10 MG tablet Take 10 mg by mouth every evening.    [provider]    Family History Family History  Problem Relation Age of Onset   Heart disease Mother    COPD  Mother    Thyroid disease Mother    Diabetes Father    Heart disease Father        CABG   Stroke Father    Parkinson's disease Father    Heart disease Brother    Stroke Brother    Hyperlipidemia Brother    Diabetes Daughter    Hearing loss Brother    Crohn's disease Other        neice   Hypertension Son    Diabetes Son    Melanoma Son    Colon cancer Neg Hx    Stomach cancer Neg Hx    Esophageal cancer Neg Hx     Social History Social History   Tobacco Use   Smoking status: Never   Smokeless tobacco: Never  Vaping Use   Vaping Use: Never  used  Substance Use Topics   Alcohol use: Not Currently    Alcohol/week: 7.0 standard drinks of alcohol    Types: 7 Glasses of wine per week    Comment: Patient no longer drinks alcohol due to her Chrohn's disease   Drug use: No     Allergies   Morphine and related   Review of Systems Review of Systems As per HPI  Physical Exam Triage Vital Signs ED Triage Vitals  Enc Vitals Group     BP 11/30/22 1108 107/67     Pulse Rate 11/30/22 1108 (!) 107     Resp 11/30/22 1108 18     Temp 11/30/22 1108 98.3 F (36.8 C)     Temp Source 11/30/22 1108 Oral     SpO2 11/30/22 1108 97 %     Weight --      Height --      Head Circumference --      Peak Flow --      Pain Score 11/30/22 1107 0     Pain Loc --      Pain Edu? --      Excl. in GC? --    No data found.  Updated Vital Signs BP 107/67 (BP Location: Left Arm)   Pulse (!) 107   Temp 98.3 F (36.8 C) (Oral)   Resp 18   SpO2 97%   Visual Acuity Right Eye Distance:   Left Eye Distance:   Bilateral Distance:    Right Eye Near:   Left Eye Near:    Bilateral Near:     Physical Exam Vitals and nursing note reviewed.  Constitutional:      Appearance: Normal appearance.     Comments: Chronically ill looking  Cardiovascular:     Rate and Rhythm: Normal rate and regular rhythm.     Pulses: Normal pulses.     Heart sounds: Normal heart sounds.  Pulmonary:      Effort: Pulmonary effort is normal.     Breath sounds: Normal breath sounds.  Abdominal:     General: Bowel sounds are normal.     Palpations: Abdomen is soft.  Neurological:     Mental Status: She is alert.      UC Treatments / Results  Labs (all labs ordered are listed, but only abnormal results are displayed) Labs Reviewed  POCT URINALYSIS DIP (MANUAL ENTRY) - Abnormal; Notable for the following components:      Result Value   Clarity, UA cloudy (*)    Nitrite, UA Positive (*)    Leukocytes, UA Small (1+) (*)    All other components within normal limits  URINE CULTURE    EKG   Radiology No results found.  Procedures Procedures (including critical care time)  Medications Ordered in UC Medications - No data to display  Initial Impression / Assessment and Plan / UC Course  I have reviewed the triage vital signs and the nursing notes.  Pertinent labs & imaging results that were available during my care of the patient were reviewed by me and considered in my medical decision making (see chart for details).     1.  Acute cystitis without hematuria: Point-of-care urinalysis is positive for leukocyte Estrace and nitrites. Keflex 500 mg twice daily for 5 days Fluconazole 150 mg x 1 dose Urine cultures have been sent Call patient if urine cultures require changing antibiotics. Return precautions given. Final Clinical Impressions(s) / UC Diagnoses   Final diagnoses:  Acute cystitis without  hematuria     Discharge Instructions      Please maintain adequate hydration Your urine was positive for urinary tract infection Will send the urine off for cultures Please take antibiotics as recommended If you have worsening symptoms please return to urgent care to be reevaluated.     ED Prescriptions     Medication Sig Dispense Auth. Provider   cephALEXin (KEFLEX) 500 MG capsule Take 1 capsule (500 mg total) by mouth 2 (two) times daily for 5 days. 10 capsule  Rosie Torrez, Britta Mccreedy, MD   fluconazole (DIFLUCAN) 150 MG tablet Take 1 tablet (150 mg total) by mouth once for 1 dose. 1 tablet Pardeep Pautz, Britta Mccreedy, MD      PDMP not reviewed this encounter.   Merrilee Jansky, MD 11/30/22 [phone]

## 2022-11-30 NOTE — ED Triage Notes (Signed)
Pt presents with c/o vaginal itching and burning, states she feel s uncomfortable. States she has been incontinent. Denies abd pain.

## 2022-11-30 NOTE — Discharge Instructions (Signed)
Please maintain adequate hydration Your urine was positive for urinary tract infection Will send the urine off for cultures Please take antibiotics as recommended If you have worsening symptoms please return to urgent care to be reevaluated.

## 2022-12-01 LAB — URINE CULTURE: Culture: >=100000 — AB

## 2022-12-02 ENCOUNTER — Ambulatory Visit: Payer: Medicare Other | Admitting: Skilled Nursing Facility1

## 2022-12-02 LAB — URINE CULTURE

## 2022-12-06 ENCOUNTER — Telehealth: Payer: Self-pay

## 2022-12-06 NOTE — Telephone Encounter (Signed)
  Date Sys (mmHg)  Dia (mmHg) HR     12/06/22 7:30 AM 97 / 65 124     12/06/22 7:29 AM 101 / 61 108     12/06/22 7:28 AM 107 / 67 128     12/05/22 8:46 PM 133 / 77 90     12/05/22 8:46 PM 132 / 75 102     12/05/22 8:44 PM 133 / 81 96     12/04/22 7:54 PM 126 / 65 95     12/03/22 8:29 PM 113 / 63 96     12/03/22 8:28 PM 115 / 62 94     12/03/22 8:26 PM 131 / 80 94     12/03/22 10:58 AM 119 / 74 109     12/03/22 7:42 AM 114 / 74 111     12/02/22 8:04 PM 130 / 77 83     12/01/22 8:45 PM 131 / 67 102     12/01/22 9:44 AM 106 / 65 110     12/01/22 9:43 AM 105 / 68 110     11/30/22 8:38 PM 135 / 81 97     11/29/22 8:46 PM 113 / 72 101     11/29/22 10:25 AM 111 / 64 101     11/28/22 8:57 PM 114 / 69 104

## 2022-12-24 ENCOUNTER — Telehealth: Payer: Self-pay

## 2022-12-24 ENCOUNTER — Ambulatory Visit: Payer: Medicare Other | Admitting: Internal Medicine

## 2022-12-24 NOTE — Telephone Encounter (Signed)
Patient's BP is well controlled on her current regimen. No recent concerns noted.   30-day BP Avg:116/70 mmHg 30-day HR Avg: 99   12/21/22 7:43 PM 125 / 79 92     12/19/22 7:26 PM 137 / 59 86     12/17/22 9:51 AM 121 / 73 90     12/16/22 7:53 PM 126 / 68 83     12/15/22 7:14 PM 140 / 86 87     12/14/22 8:24 PM 140 / 94 92    12/13/22 8:35 PM 121 / 72 96     12/13/22 2:51 PM 106 / 65 98     12/12/22 9:16 PM 123 / 76 90

## 2022-12-25 ENCOUNTER — Encounter: Payer: Self-pay | Admitting: Internal Medicine

## 2022-12-25 ENCOUNTER — Ambulatory Visit: Payer: Medicare Other | Admitting: Internal Medicine

## 2022-12-25 VITALS — BP 111/62 | HR 71 | Ht 62.0 in | Wt 133.0 lb

## 2022-12-25 DIAGNOSIS — I5032 Chronic diastolic (congestive) heart failure: Secondary | ICD-10-CM

## 2022-12-25 DIAGNOSIS — I10 Essential (primary) hypertension: Secondary | ICD-10-CM

## 2022-12-25 DIAGNOSIS — E782 Mixed hyperlipidemia: Secondary | ICD-10-CM

## 2022-12-25 MED ORDER — FUROSEMIDE 20 MG PO TABS: 20.0000 mg | ORAL_TABLET | Freq: Every day | ORAL | 2 refills | Status: DC

## 2022-12-25 NOTE — Progress Notes (Signed)
Primary Physician/Referring:  Adrian Prince, MD  Patient ID: [name], female    DOB: 1940-12-02, 82 y.o.   MRN: [id number]  Chief Complaint  Patient presents with   Hypertension   Follow-up   HPI:    [name]  is a 82 y.o. female with past medical history significant for hypertension and fibromyalgia who is here for a follow-up visit. Patient is working with PT and OT at her assisted living facility. She has progressed significantly since the last time she was here. She is no longer requiring a wheelchair and is now walking with a walker. PT anticipates that she will be able to walk with just a cane in the next month or so. She will be starting in the gym this upcoming Friday and starting to work with some weights to further develop her muscles. She is very happy with how far she has come since her ex-lap and Crohn's diagnosis. She is able to introduce vegetables to her diet now slowly. She is still eating soup but not as much as she previously was. Her diet has been very limited due to her GI issues but she is doing much better and able to eat more now. Patient denies chest pain, palpitations, diaphoresis, orthopnea, edema, claudication.  Past Medical History:  Diagnosis Date   C. difficile colitis    Cancer (HCC) 2014   kidney   COVID    Esophageal stricture    Fibromyalgia    GERD (gastroesophageal reflux disease)    Helicobacter pylori gastritis    Hypertension    Intention tremor    Lumbago    Memory loss    Osteoarthritis of spine    Pneumonia    hx of years ago    PUD (peptic ulcer disease)    PVD (peripheral vascular disease) (HCC)    Past Surgical History:  Procedure Laterality Date   ABDOMINAL HYSTERECTOMY  1985   BIOPSY  08/30/2022   Procedure: BIOPSY;  Surgeon: Benancio Deeds, MD;  Location: [facility] ENDOSCOPY;  Service: Gastroenterology;;   BREAST ENHANCEMENT SURGERY  1974   CATARACT EXTRACTION Bilateral 2014   shapiro   COLONOSCOPY WITH PROPOFOL  N/A 08/30/2022   Procedure: COLONOSCOPY WITH PROPOFOL;  Surgeon: Benancio Deeds, MD;  Location: [facility] ENDOSCOPY;  Service: Gastroenterology;  Laterality: N/A;   LAPAROSCOPIC PARTIAL NEPHRECTOMY Left April '13   [facility], Dr. Laverle Patter   LAPAROTOMY N/A 07/05/2022   Procedure: EXPLORATORY LAPAROTOMY;  Surgeon: Andria Meuse, MD;  Location: [facility] OR;  Service: General;  Laterality: N/A;   LUMBAR FUSION  03/2003   L4-5   TONSILLECTOMY  1958   TUBAL LIGATION  1984   Family History  Problem Relation Age of Onset   Heart disease Mother    COPD Mother    Thyroid disease Mother    Diabetes Father    Heart disease Father        CABG   Stroke Father    Parkinson's disease Father    Heart disease Brother    Stroke Brother    Hyperlipidemia Brother    Diabetes Daughter    Hearing loss Brother    Crohn's disease Other        neice   Hypertension Son    Diabetes Son    Melanoma Son    Colon cancer Neg Hx    Stomach cancer Neg Hx    Esophageal cancer Neg Hx     Social History   Tobacco Use  Smoking status: Never   Smokeless tobacco: Never  Substance Use Topics   Alcohol use: Not Currently    Alcohol/week: 7.0 standard drinks of alcohol    Types: 7 Glasses of wine per week    Comment: Patient no longer drinks alcohol due to her Chrohn's disease   Marital Status: Widowed  ROS  Review of Systems  Constitutional: Negative for malaise/fatigue.  Cardiovascular:  Positive for leg swelling. Negative for dyspnea on exertion and near-syncope.  Gastrointestinal:  Negative for abdominal pain and change in bowel habit.  Neurological:  Negative for dizziness, light-headedness and weakness.   Objective  Blood pressure 111/62, pulse 71, height 5\' 2"  (1.575 m), weight 133 lb (60.3 kg), SpO2 96 %. Body mass index is 24.33 kg/m.     12/25/2022   10:28 AM 11/30/2022   11:08 AM 11/20/2022    3:24 PM  Vitals with BMI  Height 5\' 2"   5\' 2"   Weight 133 lbs  135 lbs  BMI 24.32  24.69  Systolic 111  107 110  Diastolic 62 67 60  Pulse 71 107 93     Physical Exam Vitals reviewed.  Constitutional:      Appearance: Normal appearance.  HENT:     Head: Normocephalic and atraumatic.  Cardiovascular:     Rate and Rhythm: Normal rate and regular rhythm.     Pulses: Normal pulses.     Heart sounds: Murmur heard.  Pulmonary:     Effort: Pulmonary effort is normal.     Breath sounds: Normal breath sounds.  Abdominal:     General: Bowel sounds are normal.  Musculoskeletal:     Right lower leg: Edema present.     Left lower leg: Edema present.  Skin:    General: Skin is warm and dry.  Neurological:     Mental Status: She is alert.     Medications and allergies   Allergies  Allergen Reactions   Morphine And Codeine Itching and Rash    Given in IV.     Medication list after today's encounter   Current Outpatient Medications:    acetaminophen (TYLENOL) 325 MG tablet, Take 650 mg by mouth 2 (two) times daily as needed for mild pain or headache., Disp: , Rfl:    ALPRAZolam (XANAX) 0.5 MG tablet, Take 1 tablet (0.5 mg total) by mouth 2 (two) times daily as needed. for anxiety (Patient taking differently: Take 0.5 mg by mouth at bedtime. for anxiety), Disp: 5 tablet, Rfl: 1   Calcium Carbonate Antacid (TUMS PO), Take 2 tablets by mouth as needed (reflux.)., Disp: , Rfl:    Carboxymethylcellulose Sodium (EYE DROPS OP), Place 2 drops into both eyes in the morning and at bedtime., Disp: , Rfl:    DULoxetine (CYMBALTA) 60 MG capsule, Take 60 mg by mouth daily., Disp: , Rfl:    FIBER PO, Take 1 tablet by mouth daily., Disp: , Rfl:    iron polysaccharides (NIFEREX) 150 MG capsule, Take 150 mg by mouth 2 (two) times daily., Disp: , Rfl:    levothyroxine (SYNTHROID) 88 MCG tablet, Take 88 mcg by mouth every morning., Disp: , Rfl:    mesalamine (LIALDA) 1.2 g EC tablet, Take 2 tablets (2.4 g total) by mouth in the morning and at bedtime., Disp: 180 tablet, Rfl: 3   metoprolol succinate  (TOPROL-XL) 25 MG 24 hr tablet, Take 25 mg by mouth daily., Disp: , Rfl:    Multiple Vitamins-Minerals (MULTIPLE VIT/MINERALS/NO IRON PO), Take 1 tablet by  mouth daily., Disp: , Rfl:    omeprazole (PRILOSEC) 40 MG capsule, Take 1 capsule (40 mg total) by mouth daily. NEED ANNUAL VISIT FOR FURTHER REFILLS (Patient taking differently: Take 40 mg by mouth daily.), Disp: 90 capsule, Rfl: 0   ondansetron (ZOFRAN-ODT) 4 MG disintegrating tablet, Take 1 tablet (4 mg total) by mouth every 6 (six) hours as needed for nausea or vomiting., Disp: 30 tablet, Rfl: 0   rosuvastatin (CRESTOR) 10 MG tablet, Take 10 mg by mouth every evening., Disp: , Rfl:    furosemide (LASIX) 20 MG tablet, Take 1 tablet (20 mg total) by mouth daily., Disp: 90 tablet, Rfl: 2  Laboratory examination:   Lab Results  Component Value Date   NA 129 (L) 11/15/2022   K 4.5 11/15/2022   CO2 20 (L) 11/15/2022   GLUCOSE 105 (H) 11/15/2022   BUN 8 11/15/2022   CREATININE 0.56 11/15/2022   CALCIUM 7.7 (L) 11/15/2022   EGFR 90 10/14/2022   GFRNONAA >60 11/15/2022       Latest Ref Rng & Units 11/15/2022    4:17 AM 11/14/2022    4:16 AM 11/13/2022    4:38 AM  CMP  Glucose 70 - 99 mg/dL 147  829  562   BUN 8 - 23 mg/dL 8  8  8    Creatinine 0.44 - 1.00 mg/dL 1.30  8.65  7.84   Sodium 135 - 145 mmol/L 129  128  131   Potassium 3.5 - 5.1 mmol/L 4.5  4.6  4.1   Chloride 98 - 111 mmol/L 103  100  102   CO2 22 - 32 mmol/L 20  21  23    Calcium 8.9 - 10.3 mg/dL 7.7  7.8  7.7   Total Protein 6.5 - 8.1 g/dL  5.0  5.0   Total Bilirubin 0.3 - 1.2 mg/dL  0.4  0.3   Alkaline Phos 38 - 126 U/L  44  45   AST 15 - 41 U/L  13  13   ALT 0 - 44 U/L  11  12       Latest Ref Rng & Units 11/14/2022    4:16 AM 11/13/2022    9:09 AM 11/13/2022    4:38 AM  CBC  WBC 4.0 - 10.5 K/uL 9.3  7.1  5.4   Hemoglobin 12.0 - 15.0 g/dL 8.6  9.9  8.6   Hematocrit 36.0 - 46.0 % 27.1  32.7  27.1   Platelets 150 - 400 K/uL 426  439  366     Lipid Panel Recent Labs     05/01/22 0757 07/06/22 0312  CHOL 141  --   TRIG 46 93  LDLCALC 80  --   VLDL 9  --   HDL 52  --   CHOLHDL 2.7  --     HEMOGLOBIN A1C Lab Results  Component Value Date   HGBA1C 5.7 (H) 04/30/2022   MPG 116.89 04/30/2022   TSH Recent Labs    07/05/22 2143 08/29/22 0424 11/10/22 0850  TSH 4.392 4.935* 2.132    External labs:     Radiology:    Cardiac Studies:   07/07/2022 Summary LE Venous Duplex:  RIGHT:  - There is no evidence of deep vein thrombosis in the lower extremity.  However, portions of this examination were limited- see technologist  comments above.  - No cystic structure found in the popliteal fossa.  LEFT:  - There is no evidence of deep vein thrombosis in the  lower extremity.  However, portions of this examination were limited- see technologist  comments above.  - No cystic structure found in the popliteal fossa.    IMPRESSIONS ECHO 07/06/2022  1. Left ventricular ejection fraction, by estimation, is 50%. Left  ventricular ejection fraction by 2D MOD biplane is 45.8 %. The left  ventricle has low normal function. The left ventricle has no regional wall  motion abnormalities. Left ventricular  diastolic parameters are consistent with Grade I diastolic dysfunction  (impaired relaxation).   2. Right ventricular systolic function is normal. The right ventricular  size is normal.   3. The mitral valve is grossly normal. Trivial mitral valve  regurgitation.   4. The aortic valve is calcified. Aortic valve regurgitation is not  visualized. Aortic valve sclerosis is present, with no evidence of aortic  valve stenosis.    ECHO 11/11/2022:  1. Left ventricular ejection fraction, by estimation, is 55 to 60%. Left  ventricular ejection fraction by 3D volume is 60 %. The left ventricle has  normal function. The left ventricle has no regional wall motion  abnormalities. Left ventricular diastolic   parameters are consistent with Grade I diastolic  dysfunction (impaired  relaxation).   2. Right ventricular systolic function is normal. The right ventricular  size is normal. There is moderately elevated pulmonary artery systolic  pressure.   3. The mitral valve is grossly normal. Mild mitral valve regurgitation.   4. The aortic valve is calcified. Aortic valve regurgitation is not  visualized. Aortic valve sclerosis/calcification is present, without any  evidence of aortic stenosis.    EKG:   06/27/2022: NSR with rate variation, normal R wave progression, no evidence of ischemia  06/21/2022: NSR with second degree type I AV block, prolonged QTc  Assessment     ICD-10-CM   1. Essential hypertension  I10     2. Mixed hyperlipidemia  E78.2     3. Chronic diastolic (congestive) heart failure (HCC)  I50.32        No orders of the defined types were placed in this encounter.   Meds ordered this encounter  Medications   furosemide (LASIX) 20 MG tablet    Sig: Take 1 tablet (20 mg total) by mouth daily.    Dispense:  90 tablet    Refill:  2    Medications Discontinued During This Encounter  Medication Reason   Cyanocobalamin (VITAMIN B-12 PO) Completed Course   furosemide (LASIX) 20 MG tablet Reorder     Recommendations:   Bernyce Punches is a 82 y.o.  female with HTN, HLD, and grade I DD who is s/p ex-lap with Crohn's disease    Chronic diastolic (congestive) heart failure (HCC) Chronic venous insufficiency She is compliant with her compression stockings and tries to keep her legs elevated when she is sitting or laying down Venous insufficiency and her diastolic dysfunction are causing significant leg edema Patient admits she is still eating soup but not as much as before Her creatinine and blood pressures have been stable so we can increase her Lasix to 20 mg daily (previously she was only taking 10 mg daily due to hypotension). I have instructed the patient to weigh herself daily She is not short of breath,  denies orthopnea, PND LVEF normalized with grade I DD   Essential hypertension Continue current cardiac medications. Patient enrolled in RPM and pressures have been steady. Encourage low-sodium diet, less than 2000 mg daily.   Hyperlipidemia Continue statin. No myalgias reported  Follow-up in 3-6 months or sooner if needed    Clotilde Dieter, DO  12/25/2022, 11:11 AM Office: [phone] Pager: [phone]

## 2023-01-01 ENCOUNTER — Encounter (HOSPITAL_BASED_OUTPATIENT_CLINIC_OR_DEPARTMENT_OTHER): Payer: Self-pay

## 2023-01-01 ENCOUNTER — Emergency Department (HOSPITAL_BASED_OUTPATIENT_CLINIC_OR_DEPARTMENT_OTHER): Payer: Medicare Other

## 2023-01-01 ENCOUNTER — Other Ambulatory Visit: Payer: Self-pay

## 2023-01-01 ENCOUNTER — Emergency Department (HOSPITAL_BASED_OUTPATIENT_CLINIC_OR_DEPARTMENT_OTHER)
Admission: EM | Admit: 2023-01-01 | Discharge: 2023-01-01 | Disposition: A | Discharge status: Home / Self Care | Payer: Medicare Other | Attending: Emergency Medicine | Admitting: Emergency Medicine

## 2023-01-01 DIAGNOSIS — W1839XA Other fall on same level, initial encounter: Secondary | ICD-10-CM | POA: Diagnosis not present

## 2023-01-01 DIAGNOSIS — I10 Essential (primary) hypertension: Secondary | ICD-10-CM | POA: Diagnosis not present

## 2023-01-01 DIAGNOSIS — M545 Low back pain, unspecified: Secondary | ICD-10-CM | POA: Diagnosis present

## 2023-01-01 NOTE — ED Provider Notes (Signed)
[facility] Provider Note   CSN: [id number] Arrival date & time: 01/01/23  0944     History  Chief Complaint  Patient presents with   Back Pain   Fall    [name] is a 82 y.o. female with past medical history significant for intention tremor, PVD, osteoarthritis of spine, prior lumbar back surgery, fibromyalgia, hypertension presents to the ED complaining of low back pain following a fall.  Patient states she fell on Sunday in the bathroom.  Patient went to sit on her rollator walker and missed the seat fell directly onto her buttocks.  She has been using ice and Tylenol for her symptoms which she states does improve the pain.  Patient concerned due to prior surgery and continued pain for acute spinal fracture.  She has been able to walk using her walker without difficulty and it does not increase her pain.  Denies pain down her legs, weakness or numbness of lower extremities, loss of bladder or bowel control, loss of consciousness or head injury.        Home Medications Prior to Admission medications   Medication Sig Start Date End Date Taking? Authorizing Provider  acetaminophen (TYLENOL) 325 MG tablet Take 650 mg by mouth 2 (two) times daily as needed for mild pain or headache.    [provider]  ALPRAZolam [name]) 0.5 MG tablet Take 1 tablet (0.5 mg total) by mouth 2 (two) times daily as needed. for anxiety Patient taking differently: Take 0.5 mg by mouth at bedtime. for anxiety 07/16/22   Marinda Elk, MD  Calcium Carbonate Antacid (TUMS PO) Take 2 tablets by mouth as needed (reflux.).    [provider]  Carboxymethylcellulose Sodium (EYE DROPS OP) Place 2 drops into both eyes in the morning and at bedtime.    [provider]  DULoxetine (CYMBALTA) 60 MG capsule Take 60 mg by mouth daily.    [provider]  FIBER PO Take 1 tablet by mouth daily.    [provider]   furosemide (LASIX) 20 MG tablet Take 1 tablet (20 mg total) by mouth daily. 12/25/22 03/25/23  Custovic, Rozell Searing, DO  iron polysaccharides (NIFEREX) 150 MG capsule Take 150 mg by mouth 2 (two) times daily. 12/12/22   [provider]  levothyroxine (SYNTHROID) 88 MCG tablet Take 88 mcg by mouth every morning. 09/13/22   [provider]  mesalamine (LIALDA) 1.2 g EC tablet Take 2 tablets (2.4 g total) by mouth in the morning and at bedtime. 11/22/22   Armbruster, Willaim Rayas, MD  metoprolol succinate (TOPROL-XL) 25 MG 24 hr tablet Take 25 mg by mouth daily.    [provider]  Multiple Vitamins-Minerals (MULTIPLE VIT/MINERALS/NO IRON PO) Take 1 tablet by mouth daily.    [provider]  omeprazole (PRILOSEC) 40 MG capsule Take 1 capsule (40 mg total) by mouth daily. NEED ANNUAL VISIT FOR FURTHER REFILLS Patient taking differently: Take 40 mg by mouth daily. 04/29/17   Myrlene Broker, MD  ondansetron (ZOFRAN-ODT) 4 MG disintegrating tablet Take 1 tablet (4 mg total) by mouth every 6 (six) hours as needed for nausea or vomiting. 11/29/22   Armbruster, Willaim Rayas, MD  rosuvastatin (CRESTOR) 10 MG tablet Take 10 mg by mouth every evening.    [provider]      Allergies    Morphine and codeine    Review of Systems   Review of Systems  Musculoskeletal:  Positive  for back pain. Negative for gait problem.  Neurological:  Negative for syncope, weakness and numbness.    Physical Exam Updated Vital Signs BP 133/61   Pulse 91   Temp 98.1 F (36.7 C)   Resp 18   Ht 5\' 2"  (1.575 m)   Wt 57.6 kg   SpO2 98%   BMI 23.23 kg/m  Physical Exam Vitals and nursing note reviewed.  Constitutional:      General: She is not in acute distress.    Appearance: Normal appearance. She is not ill-appearing or diaphoretic.  Cardiovascular:     Rate and Rhythm: Normal rate and regular rhythm.  Pulmonary:     Effort: Pulmonary effort is normal.  Musculoskeletal:      Cervical back: Normal and full passive range of motion without pain.     Thoracic back: Normal.     Lumbar back: Tenderness (midline) and bony tenderness present. No swelling, deformity or spasms. Normal range of motion. Negative right straight leg raise test and negative left straight leg raise test.  Skin:    General: Skin is warm and dry.     Capillary Refill: Capillary refill takes less than 2 seconds.  Neurological:     Mental Status: She is alert. Mental status is at baseline.     Sensory: Sensation is intact.     Motor: Motor function is intact.     Gait: Gait is intact.     Comments: Patient able to walk with Rolator walker, which is her baseline.  She moves all extremities appropriately.  Lower extremities are neurovascularly intact.    Psychiatric:        Mood and Affect: Mood normal.        Behavior: Behavior normal.     ED Results / Procedures / Treatments   Labs (all labs ordered are listed, but only abnormal results are displayed) Labs Reviewed - No data to display  EKG None  Radiology CT Lumbar Spine Wo Contrast  Result Date: 01/01/2023 CLINICAL DATA:  Back pain since falling 3 days ago. History of back surgery. EXAM: CT LUMBAR SPINE WITHOUT CONTRAST TECHNIQUE: Multidetector CT imaging of the lumbar spine was performed without intravenous contrast administration. Multiplanar CT image reconstructions were also generated. RADIATION DOSE REDUCTION: This exam was performed according to the departmental dose-optimization program which includes automated exposure control, adjustment of the mA and/or kV according to patient size and/or use of iterative reconstruction technique. COMPARISON:  Lumbar myelogram CT 09/24/2021. Abdominopelvic CT 10/20/2022. FINDINGS: Segmentation: There are 5 lumbar type vertebral bodies. Alignment: Stable moderate convex right scoliosis centered at L2-3. There is a stepwise retrolisthesis at the L1-2, L2-3 and L3-4 levels, greatest at L2-3 measuring 5  mm. Vertebrae: No evidence of acute lumbar spine fracture or traumatic subluxation. Stable postsurgical changes from prior PLIF at L4-5 with solid interbody fusion and no hardware loosening. Paraspinal and other soft tissues: No acute paraspinal findings. Mild chronic atrophy of the lower erector spinae musculature. Disc levels: L1-2: Chronic loss of disc height with annular disc bulging, vacuum phenomenon and endplate osteophytes. Mild left foraminal narrowing. L2-3: Chronic loss of disc height with annular disc bulging, vacuum phenomenon and endplate osteophytes. Mild facet and ligamentous hypertrophy. Retrolisthesis contributes to chronic mild-to-moderate spinal stenosis and lateral recess narrowing, similar to previous study. No significant foraminal narrowing. L3-4: Stable chronic loss of disc height with annular disc bulging, vacuum phenomenon and endplate osteophytes asymmetric to the left. Moderate facet and ligamentous hypertrophy with mild spinal stenosis and  chronic moderate foraminal narrowing bilaterally. L4-5: Solid interbody and interfacetal fusion post PLIF. The spinal canal and neural foramina are widely patent. L5-S1: Disc height is maintained. Severe chronic bilateral facet hypertrophy with similar right foraminal narrowing. IMPRESSION: 1. No evidence of acute lumbar spine fracture, traumatic subluxation or static signs of instability. 2. Stable postsurgical changes from prior PLIF at L4-5 with solid interbody and interfacetal fusion. 3. Stable multilevel spondylosis with chronic mild-to-moderate spinal stenosis and lateral recess narrowing at L2-3 and mild spinal stenosis and chronic moderate foraminal narrowing bilaterally at L3-4. 4. Stable right foraminal narrowing at L5-S1. Electronically Signed   By: Carey Bullocks M.D.   On: 01/01/2023 11:20    Procedures Procedures    Medications Ordered in ED Medications - No data to display  ED Course/ Medical Decision Making/ A&P                              Medical Decision Making Amount and/or Complexity of Data Reviewed Radiology: ordered.   This patient presents to the ED with chief complaint(s) of midline lumbar back pain after fall with pertinent past medical history of prior lumbar surgery with hardware in place.  The complaint involves an extensive differential diagnosis and also carries with it a high risk of complications and morbidity.    The differential diagnosis includes acute fracture or subluxation of lumbar spine, herniated disc, contusion, musculoskeletal strain  The initial plan is to obtain CT lumbar spine  Initial Assessment:   Exam significant for midline lumbar tenderness that begins around L3 and goes to S1.  No paraspinal muscle tenderness.  Negative SLR bilaterally.  Lower extremities are neurovascularly intact.  Patient moves all extremities appropriately.  Patient is able to walk with assistance of her rollator walker.  No obvious deformities or overlying skin changes to the spine/back.   Independent visualization and interpretation of imaging: I independently visualized the following imaging with scope of interpretation limited to determining acute life threatening conditions related to emergency care: CT lumbar spine, which revealed no acute vertebral fracture or subluxation.  Hardware appears stable.  I agree with radiologist interpretation.  Disposition:   Discussed supportive care measures with patient including the use of ice, heat, and/or Tylenol.  Recommended patient follow-up with her spine specialist if she continues to have symptoms. Patient's imaging today is reassuring.  The patient has been appropriately medically screened and/or stabilized in the ED. I have low suspicion for any other emergent medical condition which would require further screening, evaluation or treatment in the ED or require inpatient management. At time of discharge the patient is hemodynamically stable and in no acute  distress. I have discussed work-up results and diagnosis with patient and answered all questions. Patient is agreeable with discharge plan. We discussed strict return precautions for returning to the emergency department and they verbalized understanding.            Final Clinical Impression(s) / ED Diagnoses Final diagnoses:  Acute midline low back pain without sciatica    Rx / DC Orders ED Discharge Orders     None         Lenard Simmer, PA-C 01/01/23 1154    Tegeler, Canary Brim, MD 01/01/23 [phone]

## 2023-01-01 NOTE — ED Triage Notes (Signed)
States had a fall Sunday, went to sit on Rolator and missed the seat, fell onto buttocks. States hx of back surgery. C/o lumbar back pain.

## 2023-01-01 NOTE — ED Notes (Signed)
Discharge instructions reviewed with patient. Patient verbalizes understanding, no further questions at this time. Medications and follow up information provided. No acute distress noted at time of departure.  

## 2023-01-01 NOTE — Discharge Instructions (Addendum)
Thank you for allowing me to be part of your care today.  Your CT imaging was reassuring.  I have provided the results here for you.  All findings are stable and there are no new fractures or displacement of vertebrae.   IMPRESSION:  1. No evidence of acute lumbar spine fracture, traumatic subluxation  or static signs of instability.  2. Stable postsurgical changes from prior PLIF at L4-5 with solid  interbody and interfacetal fusion.  3. Stable multilevel spondylosis with chronic mild-to-moderate  spinal stenosis and lateral recess narrowing at L2-3 and mild spinal  stenosis and chronic moderate foraminal narrowing bilaterally at  L3-4.  4. Stable right foraminal narrowing at L5-S1.   I recommend taking 1000 mg of Tylenol every 6 hours for the next couple of days to help with pain and inflammation.  Afterwards, you may take as needed.  DO NOT exceed 4000 mg of Tylenol in a 24-hour period from all sources.    You may continue to use ice or heat as needed.   I recommend follow-up with your spine doctor if you continue to have pain that is not improving with time, rest, and Tylenol.    Return to the ED if you develop sudden worsening of your symptoms or if you have any new concerns.

## 2023-01-02 ENCOUNTER — Telehealth: Payer: Self-pay | Admitting: Gastroenterology

## 2023-01-02 NOTE — Telephone Encounter (Signed)
Noted, information was added to appt note.

## 2023-01-02 NOTE — Telephone Encounter (Signed)
Patient called and would like to inform that during her OV on 06/06 she would also like to speak about hemorrhoids that she is having complications with.

## 2023-01-15 ENCOUNTER — Ambulatory Visit: Payer: Medicare Other | Admitting: Gastroenterology

## 2023-01-15 ENCOUNTER — Encounter: Payer: Self-pay | Admitting: Gastroenterology

## 2023-01-15 ENCOUNTER — Other Ambulatory Visit (INDEPENDENT_AMBULATORY_CARE_PROVIDER_SITE_OTHER): Payer: Medicare Other

## 2023-01-15 VITALS — BP 110/60 | HR 88 | Ht 61.0 in | Wt 132.1 lb

## 2023-01-15 DIAGNOSIS — K529 Noninfective gastroenteritis and colitis, unspecified: Secondary | ICD-10-CM

## 2023-01-15 DIAGNOSIS — A0472 Enterocolitis due to Clostridium difficile, not specified as recurrent: Secondary | ICD-10-CM

## 2023-01-15 DIAGNOSIS — D649 Anemia, unspecified: Secondary | ICD-10-CM | POA: Diagnosis not present

## 2023-01-15 NOTE — Progress Notes (Signed)
HPI :  82 year old female here for follow-up visit.  I last saw her in April.   Recall she had a prolonged hospitalization in November 23 for sepsis.  She had some thickening of her colon on CT scan at that time with concern for ischemic bowel.  Actually underwent an ex lap with general surgery which showed no evidence of ischemia.  Was diagnosed with infectious colitis and discharged to rehab.  She returned in January with worsening diarrhea, weight loss and a reported history of C. difficile.  At that time she was treated with vancomycin without any improvement.  We saw her in January, she had repeat stool testing which was negative for C. difficile, had a flex sig done which showed severe inflammation of her sigmoid and left colon through transverse colon, right colon was not evaluated.  Biopsies were suggestive of Crohn's disease.  She was started on steroids in the hospital and her symptoms significantly improved.  She was transition to prednisone and Lialda and discharged.   She unfortunately had some recurrent diarrhea in mid March. I had to go to the lab for stool testing and she was positive for C. difficile.  She was treated with a course of vancomycin and improved.  Unfortunately developed recurrent symptoms and was hospitalized from March 31 April 5 with recurrent C. difficile.  She also had a cellulitis and was on other antibiotics for that.  Infectious disease was consulted.  She was started on Dificid and linezolid and discharged in stable condition with improvement of her symptoms.  She continued Lialda, was not on any steroids.  She was doing better at her last office visit in April.  We decided to continue Lialda and she was finishing her course of antibiotics for C. difficile.  She states she was doing better in general but had not resolved her symptoms.  At baseline she was having 3-4 bowel movements per day that were usually soft to loose but manageable.  She tried expanding her diet  to food that she had previously been avoiding but she did not tolerate and had worsening stools.  She has been taking iron supplements routinely and her stools are often black.  She states she has some leakage of dark stool at times.  For the past 4 days she states she has had a "flare" of her symptoms.  Having more loose stools, some nocturnal symptoms, thinks she may have had a low-grade fever.  She is having worsening fatigue as well.  She states it does not feel like C. difficile that she has had in the past but she has had multiple courses of this to date.  She is compliant with her Lialda.  She has been off antibiotics now for several weeks.          Previous GI Evaluation    08/30/21 sigmoidoscopy  -Poor bowel preparation.  Diffuse, severe inflammation found in the sigmoid colon, in the descending colon, and in the transverse colon.  Biopsied.  Internal hemorrhoids.  Normal rectosigmoid/rectum.  Right colon not evaluated due to severe inflammatory changes   Path  A. COLON, TRANSVERSE, BIOPSY:  Ulcer with granulation tissue and exudate.  Colonic fragments with focally altered crypt architecture.  Differential diagnosis includes Crohn's.  No granulomas identified.      Imaging  CT angio chest / abd / pelvis 07/05/22  No evidence of thoracic or abdominal aortic dissection or aneurysm. Moderate to severe dilatation of the colon is noted with large amount of  stool seen throughout the colon. There appears to be somedegree of wall thickening involving the sigmoid colon suggesting infectious or inflammatory colitis. Moderate size sliding-type hiatal hernia. Mild bibasilar subsegmental atelectasis.    Past Medical History:  Diagnosis Date   C. difficile colitis    Cancer (HCC) 2014   kidney   COVID    Esophageal stricture    Fibromyalgia    GERD (gastroesophageal reflux disease)    Helicobacter pylori gastritis    Hypertension    Intention tremor    Lumbago    Memory loss     Osteoarthritis of spine    Pneumonia    hx of years ago    PUD (peptic ulcer disease)    PVD (peripheral vascular disease) (HCC)      Past Surgical History:  Procedure Laterality Date   ABDOMINAL HYSTERECTOMY  1985   BIOPSY  08/30/2022   Procedure: BIOPSY;  Surgeon: Benancio Deeds, MD;  Location: [facility] ENDOSCOPY;  Service: Gastroenterology;;   BREAST ENHANCEMENT SURGERY  1974   CATARACT EXTRACTION Bilateral 2014   shapiro   COLONOSCOPY WITH PROPOFOL N/A 08/30/2022   Procedure: COLONOSCOPY WITH PROPOFOL;  Surgeon: Benancio Deeds, MD;  Location: [facility] ENDOSCOPY;  Service: Gastroenterology;  Laterality: N/A;   LAPAROSCOPIC PARTIAL NEPHRECTOMY Left April '13   [facility], Dr. Laverle Patter   LAPAROTOMY N/A 07/05/2022   Procedure: EXPLORATORY LAPAROTOMY;  Surgeon: Andria Meuse, MD;  Location: [facility] OR;  Service: General;  Laterality: N/A;   LUMBAR FUSION  03/2003   L4-5   TONSILLECTOMY  1958   TUBAL LIGATION  1984   Family History  Problem Relation Age of Onset   Heart disease Mother    COPD Mother    Thyroid disease Mother    Diabetes Father    Heart disease Father        CABG   Stroke Father    Parkinson's disease Father    Heart disease Brother    Stroke Brother    Hyperlipidemia Brother    Diabetes Daughter    Hearing loss Brother    Crohn's disease Other        neice   Hypertension Son    Diabetes Son    Melanoma Son    Colon cancer Neg Hx    Stomach cancer Neg Hx    Esophageal cancer Neg Hx    Social History   Tobacco Use   Smoking status: Never   Smokeless tobacco: Never  Vaping Use   Vaping Use: Never used  Substance Use Topics   Alcohol use: Not Currently    Alcohol/week: 7.0 standard drinks of alcohol    Types: 7 Glasses of wine per week    Comment: Patient no longer drinks alcohol due to her Chrohn's disease   Drug use: No   Current Outpatient Medications  Medication Sig Dispense Refill   acetaminophen (TYLENOL) 325 MG tablet Take 650 mg by mouth 2  (two) times daily as needed for mild pain or headache.     ALPRAZolam (XANAX) 0.5 MG tablet Take 1 tablet (0.5 mg total) by mouth 2 (two) times daily as needed. for anxiety (Patient taking differently: Take 0.5 mg by mouth at bedtime. for anxiety) 5 tablet 1   Carboxymethylcellulose Sodium (EYE DROPS OP) Place 2 drops into both eyes in the morning and at bedtime.     DULoxetine (CYMBALTA) 60 MG capsule Take 60 mg by mouth daily.     furosemide (LASIX) 20 MG tablet Take 1  tablet (20 mg total) by mouth daily. 90 tablet 2   iron polysaccharides (NIFEREX) 150 MG capsule Take 150 mg by mouth 2 (two) times daily.     levothyroxine (SYNTHROID) 88 MCG tablet Take 88 mcg by mouth every morning.     mesalamine (LIALDA) 1.2 g EC tablet Take 2 tablets (2.4 g total) by mouth in the morning and at bedtime. 180 tablet 3   metoprolol succinate (TOPROL-XL) 25 MG 24 hr tablet Take 12.5 mg by mouth daily.     Multiple Vitamins-Iron (MULTIVITAMIN/IRON PO) Take 1 tablet by mouth 2 (two) times daily.     omeprazole (PRILOSEC) 40 MG capsule Take 1 capsule (40 mg total) by mouth daily. NEED ANNUAL VISIT FOR FURTHER REFILLS (Patient taking differently: Take 40 mg by mouth daily.) 90 capsule 0   ondansetron (ZOFRAN-ODT) 4 MG disintegrating tablet Take 1 tablet (4 mg total) by mouth every 6 (six) hours as needed for nausea or vomiting. 30 tablet 0   rosuvastatin (CRESTOR) 10 MG tablet Take 10 mg by mouth every evening.     No current facility-administered medications for this visit.   Allergies  Allergen Reactions   Morphine And Codeine Itching and Rash    Given in IV.     Review of Systems: All systems reviewed and negative except where noted in HPI.   Lab Results  Component Value Date   WBC 9.3 11/14/2022   HGB 8.6 (L) 11/14/2022   HCT 27.1 (L) 11/14/2022   MCV 96.1 11/14/2022   PLT 426 (H) 11/14/2022    Lab Results  Component Value Date   CREATININE 0.56 11/15/2022   BUN 8 11/15/2022   NA 129 (L)  11/15/2022   K 4.5 11/15/2022   CL 103 11/15/2022   CO2 20 (L) 11/15/2022    Lab Results  Component Value Date   ALT 11 11/14/2022   AST 13 (L) 11/14/2022   ALKPHOS 44 11/14/2022   BILITOT 0.4 11/14/2022     Physical Exam: BP 110/60 (BP Location: Left Arm, Patient Position: Sitting, Cuff Size: Normal)   Pulse 88   Ht 5\' 1"  (1.549 m)   Wt 132 lb 2 oz (59.9 kg)   BMI 24.96 kg/m  Constitutional: Pleasant,well-developed, female in no acute distress. Neurological: Alert and oriented to person place and time. Psychiatric: Normal mood and affect. Behavior is normal.   ASSESSMENT: 82 y.o. female here for assessment of the following  1. IBD (inflammatory bowel disease)   2. C. difficile diarrhea   3. Anemia, unspecified type    As above, I suspect she has underlying Crohn's disease.  Unfortunately she has had multiple courses of C. difficile in recent months.  She had responded to therapy for this and was doing okay on Lialda.  We tried to manage her with mesalamine initially however given her course over time I think she is likely going to need a biologic to control her symptoms.  The question is, at this time, as her current flare reflective of recurrent C. difficile or does she have a true worsening of her underlying IBD.  Will send her to the lab for C. difficile toxin/antigen testing with reflex PCR if needed.  I will also send fecal calprotectin.  We will use this information to determine next steps.  If she is having a recurrence of C. difficile we will need to start her again on therapy and get ID involved in regards to next steps.  Perhaps she may need a course  of Vowst to prevent future recurrence.  If C. difficile is negative I am going to recommend placing her on a course of steroids to treat her underlying colitis with the transition to biologic therapy.  We discussed options for biologic therapy and I would consider Thompson Grayer given her age and comorbidities, to minimize risk of  infection etc.,  Albeit main risk for this would be recurrence of her C. difficile.  In the interim we will also check a CBC today to make sure her hemoglobin is stable and not worsening given her worsening fatigue.  Once I have results of her stool test back we will make recommendations on further management.  She is feels she is stable at this time and will continue Lialda.  If worsening with increased diarrhea, develops abdominal pain, fevers etc. she will need to go to the hospital.  She agrees  PLAN: - lab today - CBC - stool test - C Diff toxin / antigen with PCR reflex and fecal calprotectin - continue Lialda - suspect we will need to escalate to biologic therapy for her if C diff is negative. Likely will start with steroids as a bridge to biologic therapy depending on how long it takes to get a regimen approved by insurance  Harlin Rain, MD [facility]

## 2023-01-15 NOTE — Patient Instructions (Addendum)
Please go to the lab in the basement of our building to have lab work done as you leave today. Hit "B" for basement when you get on the elevator.  When the doors open the lab is on your left.  We will call you with the results. Thank you.  Continue Lialda.  Thank you for entrusting me with your care and for choosing [facility], Dr. Ileene Patrick   If your blood pressure at your visit was 140/90 or greater, please contact your primary care physician to follow up on this. ______________________________________________________  If you are age 69 or older, your body mass index should be between 23-30. Your Body mass index is 24.96 kg/m. If this is out of the aforementioned range listed, please consider follow up with your Primary Care Provider.  If you are age 52 or younger, your body mass index should be between 19-25. Your Body mass index is 24.96 kg/m. If this is out of the aformentioned range listed, please consider follow up with your Primary Care Provider.  ________________________________________________________  The [facility] providers would like to encourage you to use [facility] to communicate with providers for non-urgent requests or questions.  Due to long hold times on the telephone, sending your provider a message by [facility] may be a faster and more efficient way to get a response.  Please allow 48 business hours for a response.  Please remember that this is for non-urgent requests.  _______________________________________________________  Due to recent changes in healthcare laws, you may see the results of your imaging and laboratory studies on MyChart before your provider has had a chance to review them.  We understand that in some cases there may be results that are confusing or concerning to you. Not all laboratory results come back in the same time frame and the provider may be waiting for multiple results in order to interpret others.  Please give [place] 48 hours in order  for your provider to thoroughly review all the results before contacting the office for clarification of your results.

## 2023-01-16 ENCOUNTER — Other Ambulatory Visit: Payer: Medicare Other

## 2023-01-16 ENCOUNTER — Other Ambulatory Visit: Payer: Self-pay

## 2023-01-16 DIAGNOSIS — D649 Anemia, unspecified: Secondary | ICD-10-CM

## 2023-01-16 DIAGNOSIS — K529 Noninfective gastroenteritis and colitis, unspecified: Secondary | ICD-10-CM

## 2023-01-16 DIAGNOSIS — A0472 Enterocolitis due to Clostridium difficile, not specified as recurrent: Secondary | ICD-10-CM

## 2023-01-16 DIAGNOSIS — R197 Diarrhea, unspecified: Secondary | ICD-10-CM

## 2023-01-16 LAB — CBC WITH DIFFERENTIAL/PLATELET
Basophils Absolute: 0 10*3/uL (ref 0.0–0.1)
Basophils Relative: 0.4 % (ref 0.0–3.0)
Eosinophils Absolute: 0.1 10*3/uL (ref 0.0–0.7)
Eosinophils Relative: 2.2 % (ref 0.0–5.0)
HCT: 28.5 % — ABNORMAL LOW (ref 36.0–46.0)
Hemoglobin: 9.1 g/dL — ABNORMAL LOW (ref 12.0–15.0)
Lymphocytes Relative: 49.2 % — ABNORMAL HIGH (ref 12.0–46.0)
Lymphs Abs: 3.3 10*3/uL (ref 0.7–4.0)
MCHC: 31.8 g/dL (ref 30.0–36.0)
MCV: 88 fl (ref 78.0–100.0)
Monocytes Absolute: 0.9 10*3/uL (ref 0.1–1.0)
Monocytes Relative: 13.3 % — ABNORMAL HIGH (ref 3.0–12.0)
Neutro Abs: 2.3 10*3/uL (ref 1.4–7.7)
Neutrophils Relative %: 34.9 % — ABNORMAL LOW (ref 43.0–77.0)
Platelets: 508 10*3/uL — ABNORMAL HIGH (ref 150.0–400.0)
RBC: 3.24 Mil/uL — ABNORMAL LOW (ref 3.87–5.11)
RDW: 16.2 % — ABNORMAL HIGH (ref 11.5–15.5)
WBC: 6.6 10*3/uL (ref 4.0–10.5)

## 2023-01-19 LAB — C DIFFICILE TOXINS A+B W/RFLX: C difficile Toxins A+B, EIA: NEGATIVE

## 2023-01-20 LAB — C DIFFICILE, CYTOTOXIN B

## 2023-01-21 ENCOUNTER — Other Ambulatory Visit: Payer: Self-pay | Admitting: Gastroenterology

## 2023-01-21 ENCOUNTER — Telehealth: Payer: Self-pay | Admitting: Gastroenterology

## 2023-01-21 ENCOUNTER — Other Ambulatory Visit: Payer: Self-pay

## 2023-01-21 DIAGNOSIS — A498 Other bacterial infections of unspecified site: Secondary | ICD-10-CM

## 2023-01-21 MED ORDER — FIDAXOMICIN 200 MG PO TABS: ORAL_TABLET | ORAL | 0 refills | Status: DC

## 2023-01-21 NOTE — Telephone Encounter (Signed)
Please see note from pharmacy "We do not stock this Rx, pluss the patient's copay would be over 2700 dollars. is there another alternative?"

## 2023-01-21 NOTE — Telephone Encounter (Signed)
Inbound call from patient returning phone call regarding recent lab results. Requesting a call back to discuss. Please advise, thank you.

## 2023-01-21 NOTE — Telephone Encounter (Signed)
Returned call to patient. We reviewed results, see 01/16/23 stool study result note for details.

## 2023-01-21 NOTE — Telephone Encounter (Signed)
Okay, sorry to hear this. She had a course of this previously so surprised this was not covered. If she can't get it would switch to vancomycin 125mg  four times daily for 14 days. After that, take 125mg  BID for 7 days, then 125mg / day for 7 days, then 125mg  every 2 days for another 2 weeks until done. I would like to refer her back to ID for reassessment for recurrent C Diff. They treated her when in the hospital and were involved in her care. May need to consider Vowst. Can you please refer her back to ID and ask her to touch base with [place] nexr week with an update and let us know if this is working for her? Thanks

## 2023-01-22 NOTE — Telephone Encounter (Signed)
Called and spoke with patient. I informed patient that we are sending in an alternative medication due to cost and availability at her pharmacy. Pt is aware that we have sent in an extended Vancomycin taper, she should still call next week with an update. Pt is aware that referral has been placed to ID for further evaluation of recurrent C. Diff. Pt knows to expect a call from ID to set up her appt. Pt verbalized understanding of all information and had no concerns at the end of the call.   Ambulatory referral to ID in Epic.  Vancomycin RX sent to pharmacy on file.

## 2023-01-23 ENCOUNTER — Telehealth: Payer: Self-pay | Admitting: Gastroenterology

## 2023-01-23 NOTE — Telephone Encounter (Signed)
PT called to schedule a FU for hemorrhoids with PA. She was told a nurse would work her in but only availability is 7/30 and she was told she needed to be in within a month or the hemorrhoids will start bleeding uncontrollably. Please advise.

## 2023-01-26 LAB — CALPROTECTIN, FECAL: Calprotectin, Fecal: 5810 ug/g — ABNORMAL HIGH (ref 0–120)

## 2023-01-27 ENCOUNTER — Telehealth: Payer: Self-pay | Admitting: Gastroenterology

## 2023-01-27 MED ORDER — PREDNISONE 10 MG PO TABS: ORAL_TABLET | ORAL | 0 refills | Status: AC

## 2023-01-27 NOTE — Addendum Note (Signed)
Addended by: Missy Sabins on: 01/27/2023 03:38 PM   Modules accepted: Orders

## 2023-01-27 NOTE — Telephone Encounter (Signed)
This message has been addressed and added to new phone note. See 6/17 note for details.

## 2023-01-27 NOTE — Telephone Encounter (Signed)
Sorry to hear this [place]. Her case has been complicated. Recent C Diff testing done and initial result was negative, and then another sample came back positive on the same day - not sure why 2 samples were sent with discordant results. Treating for C diff but sounds like not better at all. I think her colitis / IBD may more likely be flaring at this time perhaps and not the C diff.  She is not doing well on Lialda. Let's try adding prednisone 40mg  / day for one week and slow taper by 5mg  / week until done. I know she had wanted to avoid this if possible but concerned she is getting worse and need to keep her out of the hospital. She should continue to take the vancomycin taper as outlined. (I previously tried dificid but not covered by insurance) I would like to hear back from her in 48 hours to see how she is doing if she can update [place]. I think we need to transition her to a biologic therapy for her IBD, need to get C diff treated first before starting and will discuss options with her If she has worsening despite starting the prednisone she needs to let us know.  Thanks

## 2023-01-27 NOTE — Telephone Encounter (Signed)
PT was told to call nurse to speak about how she Is feeling. Requesting call back

## 2023-01-27 NOTE — Telephone Encounter (Signed)
Called and spoke with patient regarding recommendations below. Pt has been advised to begin Prednisone taper and continue Vancomycin taper. Pt has been instructed to call the office with an update in 48 hours. Patient also wishes to stay out of the hospital. Pt will call in the interim if no improvement in symptoms with addition of Prednisone. Pt verbalized understanding and had no concerns at the end of the call.

## 2023-01-27 NOTE — Telephone Encounter (Signed)
Returned call to patient. Pt states that she has been on Vancomycin QID for about 4.5 days and her "diarrhea seems to be getting worse, not better". Pt reports 8 stools yesterday, "some watery and some oily and sticky". Pt reports 5 stools since 4:30 am today. Pt denies any abdominal pain and vomiting. Pt has nausea most mornings and takes Zofran every other day to control symptoms. Patient states that she is trying to get protein in as much as she can, she eats yogurt and applesauce. Pt is not scheduled until 02/20/23 to see ID for consultation of recurrent C. Diff. Pt also has been having issues with her hemorrhoids and can't get an appt with an APP until the end of July. I told pt that I will try to keep an eye out for a sooner appt. Pt has been advised to try Recticare cream OTC and preparation H suppositories BID in the interim. Pt wants to know what to do about worsening symptoms in the interim. Please advise, thanks.

## 2023-01-30 ENCOUNTER — Ambulatory Visit: Payer: Medicare Other | Admitting: Internal Medicine

## 2023-01-31 NOTE — Telephone Encounter (Signed)
Inbound call from patient requesting a call back to verify after 10 days of taking Vancomycin if she is still contagious or not. Please advise.

## 2023-01-31 NOTE — Telephone Encounter (Signed)
Monday opening no longer available with you.

## 2023-01-31 NOTE — Telephone Encounter (Signed)
Called and spoke with patient regarding provided information. Patient will continue C. Diff precautions and will continue good hand hygiene. Pt states that she is taking Vancomycin and Prednisone as directed and she is "feeling better". Pt reports that she is now only having 3 stools a day and the stools are more firm. I told pt that I am happy to hear that she is feeling better. I told pt that I would make you aware. Pt had no concerns at the end of the call.

## 2023-01-31 NOTE — Telephone Encounter (Signed)
Okay, thanks very much for your help.  I think she is scheduled to see Shanda Bumps next week.  Alternatively I did have an opening, open with me on Monday afternoon if she wanted to see me then, I need to talk with her about long-term plan.  Thanks

## 2023-02-04 ENCOUNTER — Ambulatory Visit: Payer: Medicare Other | Admitting: Gastroenterology

## 2023-02-17 ENCOUNTER — Ambulatory Visit (INDEPENDENT_AMBULATORY_CARE_PROVIDER_SITE_OTHER): Payer: Medicare Other | Admitting: Gastroenterology

## 2023-02-17 ENCOUNTER — Encounter: Payer: Self-pay | Admitting: Gastroenterology

## 2023-02-17 DIAGNOSIS — R159 Full incontinence of feces: Secondary | ICD-10-CM | POA: Diagnosis not present

## 2023-02-17 DIAGNOSIS — K6289 Other specified diseases of anus and rectum: Secondary | ICD-10-CM

## 2023-02-17 DIAGNOSIS — K649 Unspecified hemorrhoids: Secondary | ICD-10-CM | POA: Diagnosis not present

## 2023-02-17 DIAGNOSIS — A498 Other bacterial infections of unspecified site: Secondary | ICD-10-CM

## 2023-02-17 DIAGNOSIS — B379 Candidiasis, unspecified: Secondary | ICD-10-CM

## 2023-02-17 MED ORDER — NYSTATIN 100000 UNIT/GM EX POWD: 1.0000 | Freq: Three times a day (TID) | CUTANEOUS | 0 refills | Status: DC

## 2023-02-17 NOTE — Progress Notes (Signed)
Chief Complaint: Rectal irritation Primary GI MD: Dr. Adela Lank  HPI: 82 year old female presents for evaluation of rectal irritation Patient last seen 01/15/2023 by Dr. Adela Lank.  Patient history of hospitalization November 2023 for sepsis. CT showed concern for ischemic bowel. Underwent ex lap with no evidence of ischemia. Dx with infectious colitis and discharged to rehab. Then had C diff in January and treated with vanc without improvement. Repeat C diff testing was reportedly negative. Flex sig showed inflammation of sigmoid, left colon through transverse colon. Biopsies suggestive of Crohn's. She went through prednisone taper and was transitioned to lialda.  Then developed C diff March 2024 and treated with Vanc. Ultimately hospitalized and complicated by cellulitis. ID put patient on dificid and linezolid with improvement.  At last visit 01/15/2023 patient's symptoms were improved. 3-4 soft bms per day. Sometimes had leakage of dark stools.  Stool studies at that time showed significantly elevated fecal calprotectin (5,810) and positive C diff. Patient was treated with fidaxomicin 200 Mg twice daily for 10 days followed by 200 Mg once every other day for 20 days (was put on vancomycin due to insurance not covering dificid).  Patient was also given a course of prednisone.  Patient states she is here today for hemorrhoids. She is on recticare and prep H suppository for morning and night. This controls her hemorrhoids overall. She does report some rectal irritation. States when she urinates she has some fecal incontinence. Stools are becoming somewhat improved. She has her appt with ID on Thursday.   Past Medical History:  Diagnosis Date   C. difficile colitis    Cancer (HCC) 2014   kidney   COVID    Esophageal stricture    Fibromyalgia    GERD (gastroesophageal reflux disease)    Helicobacter pylori gastritis    Hypertension    Intention tremor    Lumbago    Memory loss     Osteoarthritis of spine    Pneumonia    hx of years ago    PUD (peptic ulcer disease)    PVD (peripheral vascular disease) (HCC)     Past Surgical History:  Procedure Laterality Date   ABDOMINAL HYSTERECTOMY  1985   BIOPSY  08/30/2022   Procedure: BIOPSY;  Surgeon: Benancio Deeds, MD;  Location: [facility] END[facility]COPY;  Service: Gastroenterology;;   BREAST ENHANCEMENT SURGERY  1974   CATARACT EXTRACTION Bilateral 2014   shapiro   COLONOSCOPY WITH PROPOFOL N/A 08/30/2022   Procedure: COLONOSCOPY WITH PROPOFOL;  Surgeon: Benancio Deeds, MD;  Location: [facility] ENDOSCOPY;  Service: Gastroenterology;  Laterality: N/A;   LAPAROSCOPIC PARTIAL NEPHRECTOMY Left April '13   [facility], Dr. Laverle Patter   LAPAROTOMY N/A 07/05/2022   Procedure: EXPLORATORY LAPAROTOMY;  Surgeon: Andria Meuse, MD;  Location: [facility] OR;  Service: General;  Laterality: N/A;   LUMBAR FUSION  03/2003   L4-5   TONSILLECTOMY  1958   TUBAL LIGATION  1984    Current Outpatient Medications  Medication Sig Dispense Refill   acetaminophen (TYLENOL) 325 MG tablet Take 650 mg by mouth 2 (two) times daily as needed for mild pain or headache.     ALPRAZolam (XANAX) 0.5 MG tablet Take 1 tablet (0.5 mg total) by mouth 2 (two) times daily as needed. for anxiety (Patient taking differently: Take 0.5 mg by mouth at bedtime. for anxiety) 5 tablet 1   Carboxymethylcellulose Sodium (EYE DROPS OP) Place 2 drops into both eyes in the morning and at bedtime.  DULoxetine (CYMBALTA) 60 MG capsule Take 60 mg by mouth daily.     furosemide (LASIX) 20 MG tablet Take 1 tablet (20 mg total) by mouth daily. 90 tablet 2   iron polysaccharides (NIFEREX) 150 MG capsule Take 150 mg by mouth 2 (two) times daily.     levothyroxine (SYNTHROID) 88 MCG tablet Take 88 mcg by mouth every morning.     mesalamine (LIALDA) 1.2 g EC tablet Take 2 tablets (2.4 g total) by mouth in the morning and at bedtime. 180 tablet 3   metoprolol succinate (TOPROL-XL) 25 MG 24 hr  tablet Take 12.5 mg by mouth daily.     Multiple Vitamins-Iron (MULTIVITAMIN/IRON PO) Take 1 tablet by mouth 2 (two) times daily.     omeprazole (PRILOSEC) 40 MG capsule Take 1 capsule (40 mg total) by mouth daily. NEED ANNUAL VISIT FOR FURTHER REFILLS (Patient taking differently: Take 40 mg by mouth daily.) 90 capsule 0   ondansetron (ZOFRAN-ODT) 4 MG disintegrating tablet Take 1 tablet (4 mg total) by mouth every 6 (six) hours as needed for nausea or vomiting. 30 tablet 0   predniSONE (DELTASONE) 10 MG tablet Take 4 tablets (40 mg total) by mouth daily with breakfast for 7 days, THEN 3.5 tablets (35 mg total) daily with breakfast for 7 days, THEN 3 tablets (30 mg total) daily with breakfast for 7 days, THEN 2.5 tablets (25 mg total) daily with breakfast for 7 days, THEN 2 tablets (20 mg total) daily with breakfast for 7 days, THEN 1.5 tablets (15 mg total) daily with breakfast for 7 days, THEN 1 tablet (10 mg total) daily with breakfast for 7 days, THEN 0.5 tablets (5 mg total) daily with breakfast for 7 days. 126 tablet 0   rosuvastatin (CRESTOR) 10 MG tablet Take 10 mg by mouth every evening.     vancomycin (VANCOCIN) 125 MG capsule Take 1 capsule (125 mg total) by mouth 4 (four) times daily for 14 days, THEN 1 capsule (125 mg total) 2 (two) times daily for 7 days, THEN 1 capsule (125 mg total) daily for 7 days, THEN 1 capsule (125 mg total) every other day for 14 days. 84 capsule 0   No current facility-administered medications for this visit.    Allergies as of 02/17/2023 - Review Complete 01/15/2023  Allergen Reaction Noted   Morphine and codeine Itching and Rash 10/01/2011    Family History  Problem Relation Age of Onset   Heart disease Mother    COPD Mother    Thyroid disease Mother    Diabetes Father    Heart disease Father        CABG   Stroke Father    Parkinson's disease Father    Heart disease Brother    Stroke Brother    Hyperlipidemia Brother    Diabetes Daughter     Hearing loss Brother    Crohn's disease Other        neice   Hypertension Son    Diabetes Son    Melanoma Son    Colon cancer Neg Hx    Stomach cancer Neg Hx    Esophageal cancer Neg Hx     Social History   Socioeconomic History   Marital status: Widowed    Spouse name: Not on file   Number of children: 2   Years of education: BA   Highest education level: Not on file  Occupational History   Occupation: retired- [employer]   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  Tobacco Use   Smoking status: Never   Smokeless tobacco: Never  Vaping Use   Vaping Use: Never used  Substance and Sexual Activity   Alcohol use: Not Currently    Alcohol/week: 7.0 standard drinks of alcohol    Types: 7 Glasses of wine per week    Comment: Patient no longer drinks alcohol due to her Chrohn's disease   Drug use: No   Sexual activity: Not Currently  Other Topics Concern   Not on file  Social History Narrative   Lives alone   Right Handed   Drinks 2 cups coffee daily   Social Determinants of Health   Financial Resource Strain: Not on file  Food Insecurity: No Food Insecurity (11/10/2022)   Hunger Vital Sign    Worried About Running Out of Food in the Last Year: Never true    Ran Out of Food in the Last Year: Never true  Transportation Needs: No Transportation Needs (11/10/2022)   PRAPARE - Administrator, Civil Service (Medical): No    Lack of Transportation (Non-Medical): No  Physical Activity: Not on file  Stress: Not on file  Social Connections: Not on file  Intimate Partner Violence: Not At Risk (11/10/2022)   Humiliation, Afraid, Rape, and Kick questionnaire    Fear of Current or Ex-Partner: No    Emotionally Abused: No    Physically Abused: No    Sexually Abused: No    Review of Systems:    Constitutional: No weight loss, fever, chills, weakness or fatigue HEENT: Eyes: No change in vision               Ears, Nose, Throat:  No change in hearing or  congestion Skin: No rash or itching Cardiovascular: No chest pain, chest pressure or palpitations   Respiratory: No SOB or cough Gastrointestinal: See HPI and otherwise negative Genitourinary: No dysuria or change in urinary frequency Neurological: No headache, dizziness or syncope Musculoskeletal: No new muscle or joint pain Hematologic: No bleeding or bruising Psychiatric: No history of depression or anxiety    Physical Exam:  Vital signs: There were no vitals taken for this visit.  Constitutional: NAD, Well developed, Well nourished, alert and cooperative. Walks with walker Head:  Normocephalic and atraumatic. Eyes:   PEERL, EOMI. No icterus. Conjunctiva pink. Respiratory: Respirations even and unlabored. Lungs clear to auscultation bilaterally.   No wheezes, crackles, or rhonchi.  Cardiovascular:  Regular rate and rhythm. No peripheral edema, cyanosis or pallor.  Gastrointestinal:  Soft, nondistended, nontender. No rebound or guarding. Normal bowel sounds. No appreciable masses or hepatomegaly. Rectal:  nonthrombosed external hemorrhoids. Erythematous rash with satellite lesions and clear demarcation. Msk:  Symmetrical without gross deformities. Without edema, no deformity or joint abnormality.  Neurologic:  Alert and  oriented x4;  grossly normal neurologically.  Skin:   Dry and intact without significant lesions or rashes. Psychiatric: Oriented to person, place and time. Demonstrates good judgement and reason without abnormal affect or behaviors.   RELEVANT LABS AND IMAGING: CBC    Component Value Date/Time   WBC 6.6 01/15/2023 1700   RBC 3.24 (L) 01/15/2023 1700   HGB 9.1 (L) 01/15/2023 1700   HCT 28.5 (L) 01/15/2023 1700   PLT 508.0 (H) 01/15/2023 1700   MCV 88.0 01/15/2023 1700   MCH 30.5 11/14/2022 0416   MCHC 31.8 01/15/2023 1700   RDW 16.2 (H) 01/15/2023 1700   LYMPHSABS 3.3 01/15/2023 1700   MONOABS 0.9 01/15/2023 1700  EOSABS 0.1 01/15/2023 1700   BASOSABS  0.0 01/15/2023 1700    CMP     Component Value Date/Time   NA 129 (L) 11/15/2022 0417   NA 138 10/14/2022 1238   K 4.5 11/15/2022 0417   CL 103 11/15/2022 0417   CO2 20 (L) 11/15/2022 0417   GLUCOSE 105 (H) 11/15/2022 0417   BUN 8 11/15/2022 0417   BUN 13 10/14/2022 1238   CREATININE 0.56 11/15/2022 0417   CALCIUM 7.7 (L) 11/15/2022 0417   PROT 5.0 (L) 11/14/2022 0416   ALBUMIN 2.2 (L) 11/14/2022 0416   AST 13 (L) 11/14/2022 0416   ALT 11 11/14/2022 0416   ALKPHOS 44 11/14/2022 0416   BILITOT 0.4 11/14/2022 0416   GFRNONAA >60 11/15/2022 0417   GFRAA >60 05/16/2020 0603    Assessment: 1. External hemorrhoids 2. Yeast infection 3. Fecal incontinence 4. 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 Scheduled with ID on Thursday. Hopefully they can aid in getting her cleared from C diff which will help her bowel problems. She does note they are becoming more formed.   Plan: 1. Nystatin powder 3 times daily for yeast infection in gluteal cleft 2. Recommend Desitin cream after each bowel movement 3. Continue with ID appt. 4. Squatty potty to help with bowel movements. 5. If no improvement with rectal irritation please let us know 6. Patient would benefit from pelvic floor PT, referral placed.  Lara Mulch [facility] 02/17/2023, 1:22 PM  Cc: Adrian Prince, MD

## 2023-02-17 NOTE — Patient Instructions (Addendum)
_______________________________________________________  If your blood pressure at your visit was 140/90 or greater, please contact your primary care physician to follow up on this. _______________________________________________________  If you are age 82 or older, your body mass index should be between 23-30. Your Body mass index is 23.62 kg/m. If this is out of the aforementioned range listed, please consider follow up with your Primary Care Provider. ________________________________________________________  The  GI providers would like to encourage you to use [facility] to communicate with providers for non-urgent requests or questions.  Due to long hold times on the telephone, sending your provider a message by [facility] may be a faster and more efficient way to get a response.  Please allow 48 business hours for a response.  Please remember that this is for non-urgent requests.  _______________________________________________________  Bonita Quin have been referred to Pelvic Floor therapy.  They will contact you in two to three weeks to schedule an appointment.  Please purchase the following medications over the counter and take as directed:  START: using Desitin cream after every bowel movement.  We have sent the following medications to your pharmacy for you to pick up at your convenience:  START: Nystatin powder 3 times daily.  You can purchase a Equities trader at [email or web address]  You will follow up in our office on an as needed basis.   Thank you for entrusting me with your care and choosing [facility].  [place], PA-C

## 2023-02-17 NOTE — Progress Notes (Signed)
This is a very complicated patient.  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 Her fecal calprotectin is markedly elevated, her recent C. difficile testing was conflicting (1 test was positive, 1 was negative).  She was treated with both steroids and vancomycin and sounds like she is getting better.  Wonder if she may need a course of Vowst to prevent recurrent C. difficile and defer that to ID on Thursday.  Hopefully if she is feeling better with C. difficile treatment then we can continue Lialda and monitor her.  That being said, very good chance she may need biologic therapy for her underlying colitis if Lialda does not control her.  She needs to be closely followed in the next few weeks while this plays out.  Recommending the following: - awaiting ID input - does she need Vowst to help prevent recurrent C Diff - continue Lialda - recommend repeat fecal calprotectin in 3 weeks or so to trend - she needs to have follow up in 4 weeks or so for reassessment. She may need escalation to biologic therapy for her colitis if C diff is eradicated but fecal calprotectin remains elevated or symptoms persist. C diff needs to be eradicated prior to considering this.  Jan can you help place a recall for fecal calprotectin in 3 weeks or so and coordinate follow up with me or APP. Thanks

## 2023-02-19 ENCOUNTER — Telehealth: Payer: Self-pay | Admitting: Gastroenterology

## 2023-02-19 NOTE — Telephone Encounter (Signed)
Pt called and wanted to know what she was to do with the barrier cream. Discussed with her she is to use that after her BM to soothe and discomfort on her bottom. Pt also wanted to know if she should be taking a probiotic, discussed with pt that she can check with the ID doctor she is seeing tomorrow if they feel she needs to take them. Pt verbalized understanding.

## 2023-02-19 NOTE — Progress Notes (Addendum)
Patient Active Problem List   Diagnosis Date Noted   Recurrent Clostridioides difficile diarrhea 02/20/2023   Cellulitis of left upper extremity 11/14/2022   C. difficile diarrhea 11/14/2022   Fever 11/14/2022   Hypotension (arterial) 11/10/2022   Peripheral edema 11/10/2022   Hypokalemia 10/17/2022   Inflammatory bowel disease (Crohn's disease) (HCC) 10/17/2022   COVID-19 virus infection 10/17/2022   Normocytic anemia 10/17/2022   Nausea vomiting and diarrhea 10/17/2022   Chronic diarrhea 08/30/2022   Noninfectious gastroenteritis 08/30/2022   Granulomatous colitis (HCC) 08/30/2022   Hypoalbuminemia    Diarrhea 08/28/2022   Abnormal gait 07/23/2022   Severe protein-calorie malnutrition (HCC) 07/10/2022   Hypovolemia 07/05/2022   Atrioventricular block, Mobitz type 1, 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   Dizziness 06/27/2022   Prolonged Q-T interval on ECG 06/27/2022   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   TIA (transient ischemic attack) 04/30/2022   Acute respiratory failure with hypoxia (HCC) 05/11/2020   Right upper lobe pneumonia 05/11/2020   Hyponatremia 05/11/2020   GERD (gastroesophageal reflux disease)    Anxiety with depression    Anxiety 11/14/2016   Swelling 06/18/2016   Hypothyroidism 05/17/2016   Abnormal CT scan 12/28/2015   Lumbar radiculopathy 06/13/2015   Chronic low back pain 04/13/2015   Cervical disc disorder with radiculopathy of cervical region 06/20/2014   Bursitis of right shoulder 05/04/2014   Insomnia 04/21/2013   Routine health maintenance 01/28/2013   Essential hypertension 01/09/2012   Memory loss or impairment 12/02/2010   Fibromyalgia 05/04/2007    Patient's Medications  New Prescriptions   No medications on file  Previous Medications   ACETAMINOPHEN (TYLENOL) 325 MG TABLET    Take 650 mg by mouth 2 (two) times daily as needed for mild pain or headache.   ALPRAZOLAM (XANAX) 0.5 MG TABLET    Take 1 tablet (0.5 mg total) by mouth 2 (two)  times daily as needed. for anxiety   CARBOXYMETHYLCELLULOSE SODIUM (EYE DROPS OP)    Place 2 drops into both eyes in the morning and at bedtime.   DULOXETINE (CYMBALTA) 60 MG CAPSULE    Take 60 mg by mouth daily.   FUROSEMIDE (LASIX) 20 MG TABLET    Take 1 tablet (20 mg total) by mouth daily.   IRON POLYSACCHARIDES (NIFEREX) 150 MG CAPSULE    Take 150 mg by mouth 2 (two) times daily.   LEVOTHYROXINE (SYNTHROID) 88 MCG TABLET    Take 88 mcg by mouth every morning.   MESALAMINE (LIALDA) 1.2 G EC TABLET    Take 2 tablets (2.4 g total) by mouth in the morning and at bedtime.   METOPROLOL SUCCINATE (TOPROL-XL) 25 MG 24 HR TABLET    Take 12.5 mg by mouth daily.   MULTIPLE VITAMINS-IRON (MULTIVITAMIN/IRON PO)    Take 1 tablet by mouth 2 (two) times daily.   NYSTATIN (MYCOSTATIN/NYSTOP) POWDER    Apply 1 Application topically 3 (three) times daily.   OMEPRAZOLE (PRILOSEC) 40 MG CAPSULE    Take 1 capsule (40 mg total) by mouth daily. NEED ANNUAL VISIT FOR FURTHER REFILLS   ONDANSETRON (ZOFRAN-ODT) 4 MG DISINTEGRATING TABLET    Take 1 tablet (4 mg total) by mouth every 6 (six) hours as needed for nausea or vomiting.   PREDNISONE (DELTASONE) 10 MG TABLET    Take 4 tablets (40 mg total) by mouth daily with breakfast for 7 days, THEN 3.5 tablets (35 mg total) daily with breakfast for 7 days, THEN 3 tablets (30 mg  total) daily with breakfast for 7 days, THEN 2.5 tablets (25 mg total) daily with breakfast for 7 days, THEN 2 tablets (20 mg total) daily with breakfast for 7 days, THEN 1.5 tablets (15 mg total) daily with breakfast for 7 days, THEN 1 tablet (10 mg total) daily with breakfast for 7 days, THEN 0.5 tablets (5 mg total) daily with breakfast for 7 days.   ROSUVASTATIN (CRESTOR) 10 MG TABLET    Take 10 mg by mouth every evening.   VANCOMYCIN (VANCOCIN) 125 MG CAPSULE    Take 1 capsule (125 mg total) by mouth 4 (four) times daily for 14 days, THEN 1 capsule (125 mg total) 2 (two) times daily for 7 days, THEN 1  capsule (125 mg total) daily for 7 days, THEN 1 capsule (125 mg total) every other day for 14 days.  Modified Medications   No medications on file  Discontinued Medications   No medications on file    Subjective: 82 year old female with PMH as below including COVID, Renal ca s/p partial nephrectomy, GERD, esophageal stricture, PUD/H. pylori gastritis, hypertension, OA, PVD, CVA w memory loss who is referred from GI Dr. Fritzi Mandes for recurrent C diff diarrhea and possible need for FMT in the setting of inconclusive c diff testing.  She had a prolonged hospitalization in late November 2023.  CT scan at that time showed severe dilation of the colon with a large amount of stool and wall thickening.  She underwent exploratory laparotomy on 11/24 due to concern for perforated bowel.  There was no ischemic bowel found at that time and no obvious source of infection.  However, it was presumed that she had some sort of infectious vs inflammatory colitis.  She was then admitted in January with persistent diarrhea.  Apparently she had been diagnosed with C. difficile and treated at her facility with 10 days of vancomycin.  It was unclear as to whether or not she truly had C. difficile and had no improvement on vancomycin.  She underwent infectious workup at that time including C. difficile and GI pathogen panel which were negative.  She had a flex sigmoidoscopy showing severe inflammation.  Biopsies were concerning for Crohn's disease. She was started on steroids in the hospital and her symptoms significantly improved. She was transitioned to prednisone and Lialda and discharged.   She had another recurrence of diarrhea and had a positive C. difficile toxigenic PCR on 10/23/2022.  This was treated with vancomycin 125 mg 4 times daily x 10 days by her gastroenterologist with improvement. She was again treated with 10 days course of Fidaxomicin when she was admitted early April 2024 for generalized weakness/diarrhea (  4/5 [facility] ag + and toxin -). She was also being treated for LUE cellulitis with abtx at that time. She was continued on Lialda. She had another C diff test done on 6/6 when she was seen in the GI office for worsening of her diarrhea, low grade fevers, fatigue. C diff toxin A and B EIA was negative, however toxin B was positive, significantly elevated fecal calprotectiin. She was started on a prolonged tapering course of PO Vancomycin by GI and continued on steroids as well. ID consulted to weight in on FMT.   Patient reports when she has C diff she has watery stool, multiple times approx 10-12 in a day as well as night. No blood. Feels subjective minimal fevers and chills but goes away. Her regular Bms are 2-3 episodes of soft stool in a day. Stools  have become non watery and more formed with Vancomycin. She also has some fecal incontinence. Denies fevers, chills and sweats. Asking if probiotics are helpful to which I said not much evidence of benefit to prevent C diff.   Denies smoking, alcohol and illicit drug use. [place] [facility] [place]  Review of Systems: all systems reviewed with pertinent positives and negatives as listed above  Past Medical History:  Diagnosis Date   C. difficile colitis    Cancer (HCC) 2014   kidney   COVID    Esophageal stricture    Fibromyalgia    GERD (gastroesophageal reflux disease)    Helicobacter pylori gastritis    Hypertension    Intention tremor    Lumbago    Memory loss    Osteoarthritis of spine    Pneumonia    hx of years ago    PUD (peptic ulcer disease)    PVD (peripheral vascular disease) (HCC)    Past Surgical History:  Procedure Laterality Date   ABDOMINAL HYSTERECTOMY  1985   BIOPSY  08/30/2022   Procedure: BIOPSY;  Surgeon: Benancio Deeds, MD;  Location: [facility] ENDOSCOPY;  Service: Gastroenterology;;   BREAST ENHANCEMENT SURGERY  1974   CATARACT EXTRACTION Bilateral 2014   shapiro   COLONOSCOPY WITH PROPOFOL N/A 08/30/2022    Procedure: COLONOSCOPY WITH PROPOFOL;  Surgeon: Benancio Deeds, MD;  Location: [facility] ENDOSCOPY;  Service: Gastroenterology;  Laterality: N/A;   LAPAROSCOPIC PARTIAL NEPHRECTOMY Left April '13   [facility], Dr. Laverle Patter   LAPAROTOMY N/A 07/05/2022   Procedure: EXPLORATORY LAPAROTOMY;  Surgeon: Andria Meuse, MD;  Location: [facility] OR;  Service: General;  Laterality: N/A;   LUMBAR FUSION  03/2003   L4-5   TONSILLECTOMY  1958   TUBAL LIGATION  1984    Social History   Tobacco Use   Smoking status: Never   Smokeless tobacco: Never  Vaping Use   Vaping status: Never Used  Substance Use Topics   Alcohol use: Not Currently    Alcohol/week: 7.0 standard drinks of alcohol    Types: 7 Glasses of wine per week    Comment: Patient no longer drinks alcohol due to her Chrohn's disease   Drug use: No    Family History  Problem Relation Age of Onset   Heart disease Mother    COPD Mother    Thyroid disease Mother    Diabetes Father    Heart disease Father        CABG   Stroke Father    Parkinson's disease Father    Heart disease Brother    Stroke Brother    Hyperlipidemia Brother    Diabetes Daughter    Hearing loss Brother    Crohn's disease Other        neice   Hypertension Son    Diabetes Son    Melanoma Son    Colon cancer Neg Hx    Stomach cancer Neg Hx    Esophageal cancer Neg Hx     Allergies  Allergen Reactions   Morphine And Codeine Itching and Rash    Given in IV.    Health Maintenance  Topic Date Due   Zoster Vaccines- Shingrix (1 of 2) 02/25/1991   DEXA SCAN  Never done   DTaP/Tdap/Td (1 - Tdap) 01/27/2013   Medicare Annual Wellness (AWV)  04/26/2016   COVID-19 Vaccine (3 - 2023-24 season) 04/12/2022   INFLUENZA VACCINE  03/13/2023   Pneumonia Vaccine 57+ Years old  Completed  HPV VACCINES  Aged Out    Objective: BP 109/67   Pulse 72   Temp (!) 97.1 F (36.2 C) (Temporal)   Ht 5\' 2"  (1.575 m)   Wt 126 lb (57.2 kg)   SpO2 96%   BMI 23.05 kg/m    Physical Exam Constitutional:      Appearance: Normal appearance.  HENT:     Head: Normocephalic and atraumatic.      Mouth: Mucous membranes are moist.  Eyes:    Conjunctiva/sclera: Conjunctivae normal.     Pupils: Pupils are equal, round, and bilaterally symmetrical  Cardiovascular:     Rate and Rhythm: Normal rate and regular rhythm.     Heart sounds: s1s2  Pulmonary:     Effort: Pulmonary effort is normal.     Breath sounds: Normal breath sounds.   Abdominal:     General: Non distended     Palpations: soft.   Musculoskeletal:        General: Normal range of motion.   Skin:    General: Skin is warm and dry.     Comments:  Neurological:     General: grossly non focal     Mental Status: awake, alert and oriented to person, place, and time.   Psychiatric:        Mood and Affect: Mood normal.   Lab Results Lab Results  Component Value Date   WBC 6.6 01/15/2023   HGB 9.1 (L) 01/15/2023   HCT 28.5 (L) 01/15/2023   MCV 88.0 01/15/2023   PLT 508.0 (H) 01/15/2023    Lab Results  Component Value Date   CREATININE 0.56 11/15/2022   BUN 8 11/15/2022   NA 129 (L) 11/15/2022   K 4.5 11/15/2022   CL 103 11/15/2022   CO2 20 (L) 11/15/2022    Lab Results  Component Value Date   ALT 11 11/14/2022   AST 13 (L) 11/14/2022   ALKPHOS 44 11/14/2022   BILITOT 0.4 11/14/2022    Lab Results  Component Value Date   CHOL 141 05/01/2022   HDL 52 05/01/2022   LDLCALC 80 05/01/2022   TRIG 93 07/06/2022   CHOLHDL 2.7 05/01/2022   Lab Results  Component Value Date   LABRPR NON REACTIVE 04/30/2022   No results found for: "HIV1RNAQUANT", "HIV1RNAVL", "CD4TABS"  Microbiology Results for orders placed or performed in visit on 01/16/23  C difficile Toxins A+B W/Rflx     Status: None   Collection Time: 01/16/23 10:21 AM   Specimen: Stool   Stool  Result Value Ref Range Status   C difficile Toxins A+B, EIA Negative Negative Final  Calprotectin, Fecal     Status:  Abnormal   Collection Time: 01/16/23 10:21 AM   Specimen: Stool   Stool  Result Value Ref Range Status   Calprotectin, Fecal 5,810 (H) 0 - 120 ug/g Final    Comment: **Results verified by repeat testing** Concentration     Interpretation   Follow-Up < 5 - 50 ug/g     Normal           None >50 -120 ug/g     Borderline       Re-evaluate in 4-6 weeks     >120 ug/g     Abnormal         Repeat as clinically  indicated   C difficile, Cytotoxin B     Status: Abnormal   Collection Time: 01/16/23 10:21 AM   Stool  Result Value Ref Range Status   C diff Toxin B Final report (A)  Final    Comment:                             Reference Range: Negative   Result 1 Comment (A)  Final    Comment: Positive Cytotoxin detected.    Colonoacopy 08/30/22 Impression: - Preparation of the colon was poor. - Diffuse severe inflammation was found in the sigmoid colon, in the descending colon and in the transverse colon. Biopsied. - Internal hemorrhoids. - Normal rectosigmoid / rectum.  Awaiting biopsy results but likely will not be back for another 3- 4 days ( heading into weekend) . Infectious seems less likely at this point given chronicity of symptoms and stool tests. Viral possible but seems less likely, patient is not immunosuppresed. Crohn' s could certainly appear like this, given chronicity of symptoms favor starting steroids while biopsies are pending. Will discuss with the patient.  Path 08/30/22 COLON, TRANSVERSE, BIOPSY:  Ulcer with granulation tissue and exudate.  Colonic fragments with focally altered crypt architecture.  Differential diagnosis includes Crohn's.  No granulomas identified.    Imaging No results found.  Assessment/Plan 54 Y O female with multiple co-morbidities as above with   # Recurrent C diff diarrhea  - It appears she has had 4 episodes of C diff diarrhea since January in the span of last 6 months. Per guidelines, FMT is considered  after at least 3 episodes of C diff. Her picture is confounding given h/o ulcerative colitis contributing to diarrhea as well as inconclusive C diff tests done so far( C dif results from Jan 2024 unavailable, one episode of diarrhea with + toxigenic PCR only, another with positive [facility] ag but negative toxin). However, she reports she had improvement of diarrhea with C diff treatment every episode except during January  - Continue PO Vancomycin taper as planned by GI  - ? If she has toxin A negative and B positive variety of c diff which has been also discussed in literature. FMT can be considered in her case if no C/I from GI standpoint  - Discussed to be cautious about antibiotics use for future.   # IBD/crohn's disease  - On prednisone  - per GI   I have personally spent 55 minutes involved in face-to-face and non-face-to-face activities for this patient on the day of the visit. Professional time spent includes the following activities: Preparing to see the patient (review of tests), Obtaining and/or reviewing separately obtained history (admission/discharge record), Performing a medically appropriate examination and/or evaluation , Ordering medications/tests/procedures, referring and communicating with other health care professionals, Documenting clinical information in the EMR, Independently interpreting results (not separately reported), Communicating results to the patient/family/caregiver, Counseling and educating the patient/family/caregiver and Care coordination (not separately reported).   Victoriano Lain, MD [facility] 02/20/2023, 2:09 PM

## 2023-02-19 NOTE — Telephone Encounter (Signed)
Patient called in requesting to speak with a nurse in regards her appointment on 7/8.. she said she have some questions on what she was supposed to be doing .Please advise

## 2023-02-20 ENCOUNTER — Ambulatory Visit: Payer: Medicare Other | Admitting: Infectious Diseases

## 2023-02-20 ENCOUNTER — Other Ambulatory Visit: Payer: Self-pay

## 2023-02-20 ENCOUNTER — Encounter: Payer: Self-pay | Admitting: Infectious Diseases

## 2023-02-20 VITALS — BP 109/67 | HR 72 | Temp 97.1°F | Ht 62.0 in | Wt 126.0 lb

## 2023-02-20 DIAGNOSIS — A0471 Enterocolitis due to Clostridium difficile, recurrent: Secondary | ICD-10-CM | POA: Insufficient documentation

## 2023-02-20 DIAGNOSIS — R159 Full incontinence of feces: Secondary | ICD-10-CM

## 2023-02-20 DIAGNOSIS — Z79899 Other long term (current) drug therapy: Secondary | ICD-10-CM | POA: Diagnosis not present

## 2023-02-24 DIAGNOSIS — R159 Full incontinence of feces: Secondary | ICD-10-CM | POA: Insufficient documentation

## 2023-02-25 ENCOUNTER — Telehealth: Payer: Self-pay

## 2023-02-25 DIAGNOSIS — K529 Noninfective gastroenteritis and colitis, unspecified: Secondary | ICD-10-CM

## 2023-02-25 NOTE — Telephone Encounter (Signed)
Called and left detailed message for patient with recommendations.  Asked her to call back to confirm if she can keep appointment on 7-25 at 11:30am. She will need to go to the lab in 3 weeks for fecal calprotectin. Order placed

## 2023-02-25 NOTE — Telephone Encounter (Signed)
-----   Message from Benancio Deeds sent at 02/24/2023  6:11 PM EDT ----- Regarding: RE: patient follow up How about 7/25 at 1130? ----- Message ----- From: Cooper Render, CMA Sent: 02/24/2023   5:13 PM EDT To: Benancio Deeds, MD Subject: RE: patient follow up                          Your next opening is 9-12.  Please advise ----- Message ----- From: Benancio Deeds, MD Sent: 02/24/2023   4:40 PM EDT To: Cooper Render, CMA Subject: patient follow up                              Jan can you help with the following for this patient - recommend repeat fecal calprotectin in 3 weeks or so to trend - she needs to have follow up in 4 weeks or so for reassessment in the office. She may need escalation to biologic therapy for her colitis if C diff is eradicated but fecal calprotectin remains elevated or symptoms persist. C diff needs to be eradicated prior to considering this.  Thanks!

## 2023-02-28 NOTE — Telephone Encounter (Signed)
Called and spoke to patient.  She confirmed appointment on 7-25 at 11:30am

## 2023-03-06 ENCOUNTER — Other Ambulatory Visit (INDEPENDENT_AMBULATORY_CARE_PROVIDER_SITE_OTHER): Payer: Medicare Other

## 2023-03-06 ENCOUNTER — Encounter: Payer: Self-pay | Admitting: Gastroenterology

## 2023-03-06 ENCOUNTER — Other Ambulatory Visit: Payer: Self-pay

## 2023-03-06 ENCOUNTER — Ambulatory Visit (INDEPENDENT_AMBULATORY_CARE_PROVIDER_SITE_OTHER): Payer: Medicare Other | Admitting: Gastroenterology

## 2023-03-06 VITALS — BP 110/50 | HR 76 | Ht 61.0 in | Wt 128.4 lb

## 2023-03-06 DIAGNOSIS — A0471 Enterocolitis due to Clostridium difficile, recurrent: Secondary | ICD-10-CM | POA: Diagnosis not present

## 2023-03-06 DIAGNOSIS — K50119 Crohn's disease of large intestine with unspecified complications: Secondary | ICD-10-CM

## 2023-03-06 DIAGNOSIS — Z79899 Other long term (current) drug therapy: Secondary | ICD-10-CM | POA: Diagnosis not present

## 2023-03-06 DIAGNOSIS — D649 Anemia, unspecified: Secondary | ICD-10-CM | POA: Diagnosis not present

## 2023-03-06 LAB — COMPREHENSIVE METABOLIC PANEL
ALT: 23 U/L (ref 0–35)
AST: 19 U/L (ref 0–37)
Albumin: 3.4 g/dL — ABNORMAL LOW (ref 3.5–5.2)
Alkaline Phosphatase: 63 U/L (ref 39–117)
BUN: 20 mg/dL (ref 6–23)
CO2: 26 mEq/L (ref 19–32)
Calcium: 8.4 mg/dL (ref 8.4–10.5)
Chloride: 100 mEq/L (ref 96–112)
Creatinine, Ser: 0.6 mg/dL (ref 0.40–1.20)
GFR: 83.82 mL/min (ref >60.00)
Glucose, Bld: 96 mg/dL (ref 70–99)
Potassium: 4.4 mEq/L (ref 3.5–5.1)
Sodium: 137 mEq/L (ref 135–145)
Total Bilirubin: 0.2 mg/dL (ref 0.2–1.2)
Total Protein: 5.9 g/dL — ABNORMAL LOW (ref 6.0–8.3)

## 2023-03-06 LAB — CBC WITH DIFFERENTIAL/PLATELET
Basophils Absolute: 0 10*3/uL (ref 0.0–0.1)
Basophils Relative: 0.5 % (ref 0.0–3.0)
Eosinophils Absolute: 0 10*3/uL (ref 0.0–0.7)
Eosinophils Relative: 0.3 % (ref 0.0–5.0)
HCT: 32.6 % — ABNORMAL LOW (ref 36.0–46.0)
Hemoglobin: 10.1 g/dL — ABNORMAL LOW (ref 12.0–15.0)
Lymphocytes Relative: 38.5 % (ref 12.0–46.0)
Lymphs Abs: 2.2 10*3/uL (ref 0.7–4.0)
MCHC: 31 g/dL (ref 30.0–36.0)
MCV: 88.4 fl (ref 78.0–100.0)
Monocytes Absolute: 0.3 10*3/uL (ref 0.1–1.0)
Monocytes Relative: 5.9 % (ref 3.0–12.0)
Neutro Abs: 3.2 10*3/uL (ref 1.4–7.7)
Neutrophils Relative %: 54.8 % (ref 43.0–77.0)
Platelets: 407 10*3/uL — ABNORMAL HIGH (ref 150.0–400.0)
RBC: 3.68 Mil/uL — ABNORMAL LOW (ref 3.87–5.11)
RDW: 17.9 % — ABNORMAL HIGH (ref 11.5–15.5)
WBC: 5.8 10*3/uL (ref 4.0–10.5)

## 2023-03-06 LAB — IBC + FERRITIN
Ferritin: 18.9 ng/mL (ref 10.0–291.0)
Iron: 19 ug/dL — ABNORMAL LOW (ref 42–145)
Saturation Ratios: 5.6 % — ABNORMAL LOW (ref 20.0–50.0)
TIBC: 340.2 ug/dL (ref 250.0–450.0)
Transferrin: 243 mg/dL (ref 212.0–360.0)

## 2023-03-06 MED ORDER — FAMOTIDINE 20 MG PO TABS: 20.0000 mg | ORAL_TABLET | Freq: Every day | ORAL | 3 refills | Status: DC

## 2023-03-06 MED ORDER — VANCOMYCIN HCL 125 MG PO CAPS: 125.0000 mg | ORAL_CAPSULE | ORAL | 0 refills | Status: DC

## 2023-03-06 NOTE — Progress Notes (Signed)
Update med list

## 2023-03-06 NOTE — Progress Notes (Signed)
HPI :  82 year old female here for follow-up visit for colitis and recurrent C Diff.  Colitis history: Recall she had a prolonged hospitalization in November 23 for sepsis.  She had some thickening of her colon on CT scan at that time with concern for ischemic bowel.  Actually underwent an ex lap with general surgery which showed no evidence of ischemia.  Was diagnosed with infectious colitis and discharged to rehab.  She returned in January 2024 with worsening diarrhea, weight loss and a reported history of C. difficile.  At that time she was treated with vancomycin without any improvement.  We saw her in January, she had repeat stool testing which was negative for C. difficile, had a flex sig done which showed severe inflammation of her sigmoid and left colon through transverse colon, right colon was not evaluated.  Biopsies were suggestive of Crohn's disease.  She was started on steroids in the hospital and her symptoms significantly improved.  She was transition to prednisone and Lialda and discharged.   Recall her history has been complicated by recurrent C diff since her hospitalization.  She has had roughly 4 episodes of C. difficile in the past 6 months.  She has been treated with a variety of regimens to include vancomycin, Dificid, linezolid.  She has seen infectious disease in July.  Most recently she had recurrence in June and I treated her with a prolonged course of tapered vancomycin given insurance would not cover Dificid again.  We had her follow-up with infectious disease for consideration of FMT/Vowst.  Recall that on June 6 she had C. difficile toxin B+, associated with a fecal calprotectin of 5810.  We resumed treatment with vancomycin and also put her on a prednisone taper as this could have been flaring her underlying colitis as well.  She was having significant diarrhea at the time.  Since I have seen her she is doing markedly better.  She is having 2 bowel movements per day that  are mostly formed.  Incontinence and urgency has stopped.  Her hemorrhoids are much better.  Her urgency is better.  She developed a fungal infection and use of nystatin for that at her last visit which significantly helped those symptoms.  She states the infectious disease physician told her it was reasonable to consider FMT at this point but ultimately wanted to await her course.  She just finished vancomycin taper after 30 days, last week.  She is taking 10 mg of prednisone per day and tapering to 5 mg/day next week.  She is continuing 4 tablets of Lialda per day.  Of note she is been on omeprazole 40 mg daily for history of reflux.  She states she previously had some severe reflux that bother her but lately has been very well-controlled on the regimen.  We discussed risks of PPI associated with C. difficile and options to taper this off.         Previous GI Evaluation    08/30/21 sigmoidoscopy  -Poor bowel preparation.  Diffuse, severe inflammation found in the sigmoid colon, in the descending colon, and in the transverse colon.  Biopsied.  Internal hemorrhoids.  Normal rectosigmoid/rectum.  Right colon not evaluated due to severe inflammatory changes   Path  A. COLON, TRANSVERSE, BIOPSY:  Ulcer with granulation tissue and exudate.  Colonic fragments with focally altered crypt architecture.  Differential diagnosis includes Crohn's.  No granulomas identified.      Imaging  CT angio chest / abd / pelvis 07/05/22  No evidence of thoracic or abdominal aortic dissection or aneurysm. Moderate to severe dilatation of the colon is noted with large amount of stool seen throughout the colon. There appears to be somedegree of wall thickening involving the sigmoid colon suggesting infectious or inflammatory colitis. Moderate size sliding-type hiatal hernia. Mild bibasilar subsegmental atelectasis.    Past Medical History:  Diagnosis Date   C. difficile colitis    Cancer (HCC) 2014   kidney    COVID    Esophageal stricture    Fibromyalgia    GERD (gastroesophageal reflux disease)    Helicobacter pylori gastritis    Hypertension    Intention tremor    Lumbago    Memory loss    Osteoarthritis of spine    Pneumonia    hx of years ago    PUD (peptic ulcer disease)    PVD (peripheral vascular disease) (HCC)      Past Surgical History:  Procedure Laterality Date   ABDOMINAL HYSTERECTOMY  1985   BIOPSY  08/30/2022   Procedure: BIOPSY;  Surgeon: Benancio Deeds, MD;  Location: [facility] ENDOSCOPY;  Service: Gastroenterology;;   BREAST ENHANCEMENT SURGERY  1974   CATARACT EXTRACTION Bilateral 2014   shapiro   COLONOSCOPY WITH PROPOFOL N/A 08/30/2022   Procedure: COLONOSCOPY WITH PROPOFOL;  Surgeon: Benancio Deeds, MD;  Location[facility] ENDOSCOPY;  Service: Gastroenterology;  Laterality: N/A;   LAPAROSCOPIC PARTIAL NEPHRECTOMY Left April '13   [facility], Dr. Laverle Patter   LAPAROTOMY N/A 07/05/2022   Procedure: EXPLORATORY LAPAROTOMY;  Surgeon: Andria Meuse, MD;  Location: [facility] OR;  Service: General;  Laterality: N/A;   LUMBAR FUSION  03/2003   L4-5   TONSILLECTOMY  1958   TUBAL LIGATION  1984   Family History  Problem Relation Age of Onset   Heart disease Mother    COPD Mother    Thyroid disease Mother    Diabetes Father    Heart disease Father        CABG   Stroke Father    Parkinson's disease Father    Heart disease Brother    Stroke Brother    Hyperlipidemia Brother    Diabetes Daughter    Hearing loss Brother    Crohn's disease Other        neice   Hypertension Son    Diabetes Son    Melanoma Son    Colon cancer Neg Hx    Stomach cancer Neg Hx    Esophageal cancer Neg Hx    Social History   Tobacco Use   Smoking status: Never   Smokeless tobacco: Never  Vaping Use   Vaping status: Never Used  Substance Use Topics   Alcohol use: Not Currently    Alcohol/week: 7.0 standard drinks of alcohol    Types: 7 Glasses of wine per week    Comment: Patient no  longer drinks alcohol due to her Chrohn's disease   Drug use: No   Current Outpatient Medications  Medication Sig Dispense Refill   acetaminophen (TYLENOL) 325 MG tablet Take 650 mg by mouth 2 (two) times daily as needed for mild pain or headache.     ALPRAZolam (XANAX) 0.5 MG tablet Take 1 tablet (0.5 mg total) by mouth 2 (two) times daily as needed. for anxiety (Patient taking differently: Take 0.5 mg by mouth at bedtime. for anxiety) 5 tablet 1   Carboxymethylcellulose Sodium (EYE DROPS OP) Place 2 drops into both eyes in the morning and at bedtime.  DULoxetine (CYMBALTA) 60 MG capsule Take 60 mg by mouth daily.     furosemide (LASIX) 20 MG tablet Take 1 tablet (20 mg total) by mouth daily. 90 tablet 2   iron polysaccharides (NIFEREX) 150 MG capsule Take 150 mg by mouth 2 (two) times daily.     levothyroxine (SYNTHROID) 88 MCG tablet Take 88 mcg by mouth every morning.     mesalamine (LIALDA) 1.2 g EC tablet Take 2 tablets (2.4 g total) by mouth in the morning and at bedtime. 180 tablet 3   metoprolol succinate (TOPROL-XL) 25 MG 24 hr tablet Take 12.5 mg by mouth daily.     Multiple Vitamins-Iron (MULTIVITAMIN/IRON PO) Take 1 tablet by mouth 2 (two) times daily.     nystatin (MYCOSTATIN/NYSTOP) powder Apply 1 Application topically 3 (three) times daily. 15 g 0   omeprazole (PRILOSEC) 40 MG capsule Take 1 capsule (40 mg total) by mouth daily. NEED ANNUAL VISIT FOR FURTHER REFILLS (Patient taking differently: Take 40 mg by mouth daily.) 90 capsule 0   ondansetron (ZOFRAN-ODT) 4 MG disintegrating tablet Take 1 tablet (4 mg total) by mouth every 6 (six) hours as needed for nausea or vomiting. 30 tablet 0   predniSONE (DELTASONE) 10 MG tablet Take 4 tablets (40 mg total) by mouth daily with breakfast for 7 days, THEN 3.5 tablets (35 mg total) daily with breakfast for 7 days, THEN 3 tablets (30 mg total) daily with breakfast for 7 days, THEN 2.5 tablets (25 mg total) daily with breakfast for 7  days, THEN 2 tablets (20 mg total) daily with breakfast for 7 days, THEN 1.5 tablets (15 mg total) daily with breakfast for 7 days, THEN 1 tablet (10 mg total) daily with breakfast for 7 days, THEN 0.5 tablets (5 mg total) daily with breakfast for 7 days. 126 tablet 0   rosuvastatin (CRESTOR) 10 MG tablet Take 10 mg by mouth every evening.     No current facility-administered medications for this visit.   Allergies  Allergen Reactions   Morphine And Codeine Itching and Rash    Given in IV.     Review of Systems: All systems reviewed and negative except where noted in HPI.   Lab Results  Component Value Date   WBC 6.6 01/15/2023   HGB 9.1 (L) 01/15/2023   HCT 28.5 (L) 01/15/2023   MCV 88.0 01/15/2023   PLT 508.0 (H) 01/15/2023    Lab Results  Component Value Date   NA 129 (L) 11/15/2022   CL 103 11/15/2022   K 4.5 11/15/2022   CO2 20 (L) 11/15/2022   BUN 8 11/15/2022   CREATININE 0.56 11/15/2022   GFRNONAA >60 11/15/2022   CALCIUM 7.7 (L) 11/15/2022   PHOS 3.5 07/09/2022   ALBUMIN 2.2 (L) 11/14/2022   GLUCOSE 105 (H) 11/15/2022    Lab Results  Component Value Date   ALT 11 11/14/2022   AST 13 (L) 11/14/2022   ALKPHOS 44 11/14/2022   BILITOT 0.4 11/14/2022    Physical Exam: BP (!) 110/50 (BP Location: Left Arm, Patient Position: Sitting, Cuff Size: Normal)   Pulse 76   Ht 5\' 1"  (1.549 m)   Wt 128 lb 6 oz (58.2 kg)   BMI 24.26 kg/m  Constitutional: Pleasant,well-developed, female in no acute distress. Neurological: Alert and oriented to person place and time. Psychiatric: Normal mood and affect. Behavior is normal.   ASSESSMENT: 82 y.o. female here for assessment of the following  1. Recurrent Clostridioides difficile diarrhea  2. Crohn's disease of large intestine with complication (HCC)   3. Anemia, unspecified type   4. Long-term current use of proton pump inhibitor therapy    As above, likely underlying Crohn's colitis with recurrent C. difficile,  4 episodes over the past 6 months.  She has responded well to prolonged vancomycin taper as well as a prednisone taper.  Her fecal calprotectin was markedly elevated along with recurrent symptoms.  Her symptoms have since resolved.  Discussed her course at length, she is very fearful for recurrent C. difficile and we discussed option of FMT / Vowst / [place].  I think given she has had 4 recurrences, her age, history of IBD with potential need to escalate to biologic therapy, reasonable to give her a course of Vowst if her insurance will cover this.  Will apply for it.  In the interim we will extend her vancomycin taper, 1 tablet every other day for another few weeks until we get an answer about the Vowst.  We discussed the regimen in general, potential risks, she understands and wants to proceed with it if covered.  Until then we will taper off her prednisone over the next 2 weeks.  Continue Lialda 4 tabs daily.  I am going to have her go to the lab today to check her blood work, make sure anemia is improving, renal function stable on mesalamine, and iron studies to see if she needs an iron infusion.  I also asked her to go to the lab for fecal calprotectin now that she is feeling much better.  Once we have eradicated the C. difficile, we will see how she does symptomatically on Lialda monotherapy.  I think it remains quite possible she will need biologic therapy to control her underlying colitis.  We discussed possible regimens and risks of those to include immunosuppression and recurrent C. difficile.  Hopefully we can get by with mesalamine, time will tell.  She understands this.  To further reduce her risk for recurrent C. difficile it would be best to get her off PPI.  We discussed associated risks of C. difficile on chronic PPI.  She will reduce her omeprazole to every other day dosing for the next 2 weeks and if she does well with this we will transition to Pepcid 20 mg daily.  Hopefully her reflux  can be managed off PPI.  She agrees.  She will need a follow-up colonoscopy at some point with me while on Lialda monotherapy to see if she is in remission, however she is not ready for that right now.  I will see her in the office in 2 months for reassessment, call sooner if any issues or recurrence while she is on this regimen  Overall doing much better since the last visit.  PLAN: - resume vancomycin 1 tab every other day for 2 weeks, may extend longer if needed - order course of Vowst if insurance will cover it to reduce risk of recurrence - continue prednisone taper, to be completed next week - continue Lialda 2 tabs BID - reduce omeprazole to once every other day for 2 weeks and then transition to pepcid 20mg  / day - lab today - CBC, CMET, TIBC / ferritin - check renal function on Lialda - lab for fecal calprotectin - hopefully once C diff eradicated we can control her colitis on Lialda, if not will need to escalate to biologic therapy - will need follow up colonoscopy at some point as outlined - follow in the office  with me 2 months, call sooner if issues  Harlin Rain, MD [facility]

## 2023-03-06 NOTE — Patient Instructions (Addendum)
If your blood pressure at your visit was 140/90 or greater, please contact your primary care physician to follow up on this. ______________________________________________________  If you are age 82 or older, your body mass index should be between 23-30. Your Body mass index is 24.26 kg/m. If this is out of the aforementioned range listed, please consider follow up with your Primary Care Provider.  If you are age 67 or younger, your body mass index should be between 19-25. Your Body mass index is 24.26 kg/m. If this is out of the aformentioned range listed, please consider follow up with your Primary Care Provider.  ________________________________________________________  The [facility] providers would like to encourage you to use [facility] to communicate with providers for non-urgent requests or questions.  Due to long hold times on the telephone, sending your provider a message by [facility] may be a faster and more efficient way to get a response.  Please allow 48 business hours for a response.  Please remember that this is for non-urgent requests.  _______________________________________________________  Due to recent changes in healthcare laws, you may see the results of your imaging and laboratory studies on MyChart before your provider has had a chance to review them.  We understand that in some cases there may be results that are confusing or concerning to you. Not all laboratory results come back in the same time frame and the provider may be waiting for multiple results in order to interpret others.  Please give [place] 48 hours in order for your provider to thoroughly review all the results before contacting the office for clarification of your results.   Please go to the lab in the basement of our building to have lab work done as you leave today. Hit "B" for basement when you get on the elevator.  When the doors open the lab is on your left.  We will call you with the results. Thank you.  We  have sent the following medications to your pharmacy for you to pick up at your convenience: Vancomycin 125 mg: Take one tablet every other day  We will send a prescription for VOWST to the pharmacy and see if we can get approval.    Decrease omeprazole to every other day for 2 weeks then transition to Pepcid 20 mg every day  You have been scheduled for a follow up appointment on 9-27 at 11:00am. Please arrive 10 minutes early for registration. If you need to reschedule or cancel this appointment please call [phone] as soon as possible. Thank you.   Continue Lialda  Finish your prednisone taper.  Thank you for entrusting me with your care and for choosing [facility], Dr. Ileene Patrick

## 2023-03-07 ENCOUNTER — Telehealth: Payer: Self-pay | Admitting: Gastroenterology

## 2023-03-07 ENCOUNTER — Other Ambulatory Visit: Payer: Self-pay | Admitting: Gastroenterology

## 2023-03-07 DIAGNOSIS — D649 Anemia, unspecified: Secondary | ICD-10-CM

## 2023-03-07 DIAGNOSIS — K50119 Crohn's disease of large intestine with unspecified complications: Secondary | ICD-10-CM

## 2023-03-07 NOTE — Telephone Encounter (Signed)
Returned call to patient. Pt stated that she was interested in IV iron. After reviewing lab results from yesterday Dr. Adela Lank did recommend that patient try increasing oral iron to three times a day, if not tolerating well then we could order IV iron. Pt is not having any GI issues on Iron BID. Pt will try TID dosing of oral iron next week, she will then let us know if she still wishes to proceed with IV iron. Pt verbalized understanding.

## 2023-03-07 NOTE — Telephone Encounter (Signed)
Pt wants to schedule an iron infusion.

## 2023-03-11 ENCOUNTER — Ambulatory Visit: Payer: Medicare Other | Admitting: Gastroenterology

## 2023-03-12 ENCOUNTER — Other Ambulatory Visit: Payer: Medicare Other

## 2023-03-12 ENCOUNTER — Telehealth: Payer: Self-pay | Admitting: Gastroenterology

## 2023-03-12 DIAGNOSIS — A0471 Enterocolitis due to Clostridium difficile, recurrent: Secondary | ICD-10-CM

## 2023-03-12 DIAGNOSIS — Z79899 Other long term (current) drug therapy: Secondary | ICD-10-CM

## 2023-03-12 DIAGNOSIS — K50119 Crohn's disease of large intestine with unspecified complications: Secondary | ICD-10-CM

## 2023-03-12 DIAGNOSIS — D649 Anemia, unspecified: Secondary | ICD-10-CM

## 2023-03-12 NOTE — Telephone Encounter (Signed)
Case #[id number]

## 2023-03-12 NOTE — Telephone Encounter (Signed)
Inbound call from [employer]. States they need clarification on enrollment papers. A call back number is [phone]. Please advise, thank you.

## 2023-03-12 NOTE — Telephone Encounter (Signed)
Called [employer]. They wanted the dates and dosing specifics of the last 2 scripts of Vancomycin that were sent. They will forward the request to the specialty pharmacy which will do benefits investigation. They can be reached at [phone] [facility].

## 2023-03-13 NOTE — Telephone Encounter (Signed)
Fax rec'd indicating they needed a phone number for the patient. Her contact number was faxed to [phone]

## 2023-03-14 ENCOUNTER — Telehealth: Payer: Self-pay | Admitting: Pharmacy Technician

## 2023-03-14 NOTE — Telephone Encounter (Signed)
Pharmacy Patient Advocate Encounter   Received notification from CoverMyMeds that prior authorization for VOWST  is required/requested.   Insurance verification completed.   The patient is insured through [facility] .   Per test claim: PA required; PA started via CoverMyMeds. KEY [id number] . Waiting for clinical questions to populate.

## 2023-03-17 NOTE — Telephone Encounter (Signed)
Called [employer] [place] at [phone].  [employer] indicates that Prior Berkley Harvey has been approved for [employer]. They are reaching out to patient today regarding sending her the medication.she has already been sent the prep kit

## 2023-03-17 NOTE — Telephone Encounter (Signed)
Called and spoke to patient.  She indicates she still has vancomycin and is taking it every other day.  She understands to continue that until she starts on Vowst. She will let us know if she needs more. I let her know she should hear from [facility] today.

## 2023-03-17 NOTE — Telephone Encounter (Signed)
Excellent thanks Jan. Hopefully she has enough vancomycin to last until she can get the Vowst. I had wanted her to take the Vancomycin every other day until she could get the Vowst. Thanks

## 2023-03-18 ENCOUNTER — Other Ambulatory Visit: Payer: Self-pay

## 2023-03-18 ENCOUNTER — Telehealth: Payer: Self-pay | Admitting: Gastroenterology

## 2023-03-18 MED ORDER — VANCOMYCIN HCL 125 MG PO CAPS: 125.0000 mg | ORAL_CAPSULE | ORAL | 0 refills | Status: DC

## 2023-03-18 NOTE — Telephone Encounter (Signed)
Refill sent to hold patient over until she gets started on Vowst.

## 2023-03-18 NOTE — Telephone Encounter (Signed)
Inbound call from patient stating she needs a refill on Vancomycin. Patient stated she will be out on 8/8 and needs the medication until she can get her new medication. Please advise.

## 2023-03-24 ENCOUNTER — Other Ambulatory Visit (HOSPITAL_COMMUNITY): Payer: Self-pay

## 2023-03-24 NOTE — Telephone Encounter (Signed)
Pharmacy Patient Advocate Encounter  Received notification from [facility] that Prior Authorization for VOWST has been APPROVED from . to .Marland Kitchen Ran test claim, Copay is $2407.43. This test claim was processed through [facility] copay amounts may vary at other pharmacies due to pharmacy/plan contracts, or as the patient moves through the different stages of their insurance plan.   PA #/Case ID/Reference #: [name]

## 2023-03-27 ENCOUNTER — Other Ambulatory Visit: Payer: Self-pay

## 2023-03-27 ENCOUNTER — Telehealth: Payer: Self-pay | Admitting: Gastroenterology

## 2023-03-27 DIAGNOSIS — A0471 Enterocolitis due to Clostridium difficile, recurrent: Secondary | ICD-10-CM

## 2023-03-27 MED ORDER — NYSTATIN 100000 UNIT/ML MT SUSP: 6.0000 mL | Freq: Four times a day (QID) | OROMUCOSAL | 0 refills | Status: DC

## 2023-03-27 NOTE — Telephone Encounter (Signed)
If she has had this in the past and feels confident she has thrush we can give prescription for nystatin 400,000 to 600,000 units 4 times daily; swish in the mouth and retain for as long as possible (several minutes) before swallowing. Duration is for 7 days.  Thanks

## 2023-03-27 NOTE — Telephone Encounter (Signed)
Contacted patient and let her know that nystatin suspension has been sent to her pharmacy. Also informed patient to take 400,000 to 600,00 units 4 times daily ; Swidh in the mouth and retain as long as possible for 7 days.

## 2023-03-27 NOTE — Telephone Encounter (Signed)
PT is calling to get a script for thrush sent to [facility]. She would like it sent before 3pm to be delivered with her other medications if possible. Please advise.

## 2023-03-31 ENCOUNTER — Ambulatory Visit: Payer: Medicare Other | Admitting: Neurology

## 2023-04-02 NOTE — Telephone Encounter (Signed)
Called and spoke with patient regarding recommendations. Pt will have her daughter stop by to pick up stool kit tomorrow and she will try to return ASAP this week. Pt states that she would like to hold off on Prednisone until results return. Pt verbalized understanding and had no concerns.

## 2023-04-02 NOTE — Telephone Encounter (Signed)
Patient called states she is having episodes of diarrhea since Saturday also said she finished her Prednisone medication.. Requesting a call back from a nurse.

## 2023-04-02 NOTE — Telephone Encounter (Signed)
Returned call to patient. Pt reports that she completed 97-month prednisone taper on 03/18/23. Pt completed Vancomycin on 03/20/23, the completed Vowst from 03/21/23-03/24/23. Pt reports that since 04-25-2023 she has passed at least 6 stools a day. Pt describes stools as liquid and some formed. Patient is passing mucous and has some urgency to have a bowel movement. Denies rectal bleeding or abdominal pain. Pt reports that she continued to have diarrhea through Vowst treatment, it was not as bad then. Pt had no diarrhea at all while on Prednisone. Pt has continued Lialda 2 tablets BID. Please advise, thanks.

## 2023-04-02 NOTE — Telephone Encounter (Signed)
Okay sorry to hear this.  Suspect this may be her underlying colitis driving this but do think we need to do C Diff toxin A / B if you can order that and have her go to the lab ASAP to make sure negative, that has confounded her course. She may need to go back on prednisone and escalate to biologic therapy. Ideally would like C Diff testing back first but if she can't wait can put her back on prednisone 20mg  / day until we get the C Diff result back. Thanks

## 2023-04-03 ENCOUNTER — Other Ambulatory Visit: Payer: Medicare Other

## 2023-04-04 ENCOUNTER — Other Ambulatory Visit: Payer: Medicare Other

## 2023-04-04 DIAGNOSIS — A0471 Enterocolitis due to Clostridium difficile, recurrent: Secondary | ICD-10-CM

## 2023-04-07 ENCOUNTER — Telehealth: Payer: Self-pay

## 2023-04-07 LAB — C. DIFFICILE GDH AND TOXIN A/B
GDH ANTIGEN: DETECTED
MICRO NUMBER:: 15374032
SPECIMEN QUALITY:: ADEQUATE
TOXIN A AND B: DETECTED

## 2023-04-07 MED ORDER — VANCOMYCIN HCL 125 MG PO CAPS: ORAL_CAPSULE | ORAL | 0 refills | Status: AC

## 2023-04-07 NOTE — Telephone Encounter (Addendum)
Called and spoke with patient regarding results and recommendations. Pt is very shocked by results and frustrated due to still having C. Diff even after Vowst. Pt stated that she was almost in tears. Pt states that her main symptom is fatigue, which she knows could be caused by her losing electrolytes in her stool. Pt has been advised to stay hydrated with Gatorade or Pedialyte. Pt states that her symptoms are not like they were before. Pt states that the diarrhea is more controllable this time. She will have breakthrough diarrhea then more firm stools. Pt denies being on any antibiotics recently. Pt will go back on Vancomycin as recommended. In the interim pt with reach out to Dr. Elinor Parkinson of ID to see if they have any additional recommendations at this time. Pt knows to initiate C. Diff precautions at this time. Pt states that she has a cardiology appt scheduled for later this week and can't miss it since she hasn't been seen there in several months. I told pt that she should be fine to go for an office visit, just make sure to use restroom at home before going. Pt will call us later this week with an update.   Vancomycin RX sent to pharmacy on file.

## 2023-04-07 NOTE — Telephone Encounter (Signed)
Call received regarding + C. Diff. Results in Epic.

## 2023-04-07 NOTE — Addendum Note (Signed)
Addended by: Missy Sabins on: 04/07/2023 03:46 PM   Modules accepted: Orders

## 2023-04-07 NOTE — Telephone Encounter (Signed)
[name] can you please let the patient know unfortunately [name] tested positive again for C Diff.  This is really unfortunate and uncommon, especially after having the Vowst.  Can you see how [name] is feeling? Is [name] on any other new antibiotics lately?  I recommend [name] go back on vancomycin 125mg  tabs - 4 times daily for 2 weeks, then twice daily for one week, then once daily for one week, and then every other day for 2 weeks. Can you help order this for her? I would like her to call Dr. Elinor Parkinson of ID as well for their opinion given this recurrence despite Vowst, and see if there is anything else they recommend. If [name] does not get better with treatment of Cdiff over the next few days [name] needs to let us know, may need more prednisone. Hopefully not, but [name] needs to keep [place] posted on her course. Thanks

## 2023-04-10 ENCOUNTER — Telehealth: Payer: Self-pay | Admitting: Gastroenterology

## 2023-04-10 ENCOUNTER — Other Ambulatory Visit: Payer: Self-pay | Admitting: Gastroenterology

## 2023-04-10 NOTE — Telephone Encounter (Signed)
Refill of Zofran sent to pharmacy.  Patient has appointment scheduled for 9-27

## 2023-04-10 NOTE — Telephone Encounter (Signed)
Inbound call from patient stating that she has C-diff again and has been nauseous. Patient is requesting a refill for Zofran. Please advise.

## 2023-04-10 NOTE — Progress Notes (Deleted)
Cardiology Office Note:  .    Date:  04/10/2023  ID:  [name], DOB [birth year], MRN [id number] PCP: Adrian Prince, MD  [facility] Providers Cardiologist:  None { Click to update primary MD,subspecialty MD or APP then REFRESH:1}    CC: *** Consulted for the evaluation of  at the behest of ***   History of Present Illness: .    [name] is a 82 y.o. female HTN, fibromyalgia.  With LE edema. Saw a different cardiology group (Dr. Melton Alar) for diastolic heart failure.  Patient notes that she is feeling ***.   Was last feeling well ***. Able to ***  Has had no chest pain, chest pressure, chest tightness, chest stinging ***.  Discomfort occurs with ***, worsens with ***, and improves with ***.    Patient exertion notable for *** with *** and feels no symptoms.    No shortness of breath, DOE ***.  No PND or orthopnea***.  No weight gain***, leg swelling ***, or abdominal swelling***.  No syncope or near syncope ***. Notes *** no palpitations or funny heart beats.     Patient reports prior cardiac testing including ***  No history of ***pre-eclampsia, gestation HTN or gestational DM.  No Fen-Phen or drug use***.  Ambulatory BP ***.     Relevant histories: .  Social *** ROS: As per HPI.   Studies Reviewed: .   Cardiac Studies & Procedures       ECHOCARDIOGRAM  ECHOCARDIOGRAM COMPLETE 11/11/2022  Narrative ECHOCARDIOGRAM REPORT    Patient Name:   [name] Date of Exam: 11/11/2022 Medical Rec #:  [id number]       Height:       62.0 in Accession #:    [id number]      Weight:       140.0 lb Date of Birth:  [birth year]       BSA:          1.643 m Patient Age:    81 years        BP:           114/54 mmHg Patient Gender: F               HR:           91 bpm. Exam Location:  Inpatient  Procedure: 2D Echo, Cardiac Doppler and Color Doppler  Indications:     R94.31 Abnormal EKG  History:         Patient has prior history of Echocardiogram  examinations, most recent 07/06/2022. TIA, Signs/Symptoms:Edema, Shortness of Breath, Dyspnea and Dizziness/Lightheadedness; Risk Factors:Hypertension.  Sonographer:     Sheralyn Boatman RDCS Referring Phys:  [id number] [facility] CUSTOVIC Diagnosing Phys: Rozell Searing Custovic  IMPRESSIONS   1. Left ventricular ejection fraction, by estimation, is 55 to 60%. Left ventricular ejection fraction by 3D volume is 60 %. The left ventricle has normal function. The left ventricle has no regional wall motion abnormalities. Left ventricular diastolic parameters are consistent with Grade I diastolic dysfunction (impaired relaxation). 2. Right ventricular systolic function is normal. The right ventricular size is normal. There is moderately elevated pulmonary artery systolic pressure. 3. The mitral valve is grossly normal. Mild mitral valve regurgitation. 4. The aortic valve is calcified. Aortic valve regurgitation is not visualized. Aortic valve sclerosis/calcification is present, without any evidence of aortic stenosis.  FINDINGS Left Ventricle: Left ventricular ejection fraction, by estimation, is 55 to 60%. Left ventricular ejection fraction by 3D volume is 60 %.  The left ventricle has normal function. The left ventricle has no regional wall motion abnormalities. The left ventricular internal cavity size was normal in size. There is no left ventricular hypertrophy. Left ventricular diastolic parameters are consistent with Grade I diastolic dysfunction (impaired relaxation).  Right Ventricle: The right ventricular size is normal. No increase in right ventricular wall thickness. Right ventricular systolic function is normal. There is moderately elevated pulmonary artery systolic pressure. The tricuspid regurgitant velocity is 3.18 m/s, and with an assumed right atrial pressure of 15 mmHg, the estimated right ventricular systolic pressure is 55.4 mmHg.  Left Atrium: Left atrial size was normal in size.  Right Atrium:  Right atrial size was normal in size.  Pericardium: There is no evidence of pericardial effusion.  Mitral Valve: The mitral valve is grossly normal. Mild mitral valve regurgitation.  Tricuspid Valve: The tricuspid valve is grossly normal. Tricuspid valve regurgitation is mild.  Aortic Valve: The aortic valve is calcified. Aortic valve regurgitation is not visualized. Aortic valve sclerosis/calcification is present, without any evidence of aortic stenosis.  Pulmonic Valve: The pulmonic valve was grossly normal. Pulmonic valve regurgitation is not visualized.  Aorta: The aortic root is normal in size and structure.  IAS/Shunts: No atrial level shunt detected by color flow Doppler.   LEFT VENTRICLE PLAX 2D LVIDd:         4.60 cm         Diastology LVIDs:         3.20 cm         LV e' medial:   8.05 cm/s LV PW:         0.80 cm         LV E/e' medial: 11.5 LV IVS:        1.30 cm LVOT diam:     2.00 cm LV SV:         67              3D Volume EF LV SV Index:   41              LV 3D EF:    Left LVOT Area:     3.14 cm                     ventricul ar ejection fraction by 3D volume is 60 %.  3D Volume EF: 3D EF:        60 % LV EDV:       94 ml LV ESV:       37 ml LV SV:        56 ml  RIGHT VENTRICLE             IVC RV S prime:     10.20 cm/s  IVC diam: 2.30 cm TAPSE (M-mode): 1.7 cm  LEFT ATRIUM             Index        RIGHT ATRIUM          Index LA diam:        2.90 cm 1.77 cm/m   RA Area:     9.33 cm LA Vol (A2C):   60.8 ml 37.01 ml/m  RA Volume:   16.20 ml 9.86 ml/m LA Vol (A4C):   29.2 ml 17.77 ml/m LA Biplane Vol: 44.6 ml 27.15 ml/m AORTIC VALVE LVOT Vmax:   110.00 cm/s LVOT Vmean:  77.100 cm/s LVOT VTI:    0.214 m  AORTA Ao  Root diam: 2.90 cm Ao Asc diam:  3.00 cm  MITRAL VALVE               TRICUSPID VALVE MV Area (PHT): 4.60 cm    TR Peak grad:   40.4 mmHg MV Decel Time: 165 msec    TR Vmax:        318.00 cm/s MV E velocity: 92.70 cm/s MV A  velocity: 83.20 cm/s  SHUNTS MV E/A ratio:  1.11        Systemic VTI:  0.21 m Systemic Diam: 2.00 cm  Rozell Searing Custovic Electronically signed by Clotilde Dieter Signature Date/Time: 11/11/2022/2:53:01 PM    Final             *** Risk Assessment/Calculations:    {Does this patient have ATRIAL FIBRILLATION?:[phone]}   {This patient may be at risk for Amyloid.  Click HERE to open Cardiac Amyloid Screening SmartSet to order screening OR Click HERE to defer testing for 1 year or permanently :1}    Physical Exam:    VS:  There were no vitals taken for this visit.   Wt Readings from Last 3 Encounters:  03/06/23 128 lb 6 oz (58.2 kg)  02/20/23 126 lb (57.2 kg)  02/17/23 125 lb (56.7 kg)    Gen: *** distress, *** obese/well nourished/malnourished   Neck: No JVD, *** carotid bruit Ears: *** Frank Sign Cardiac: No Rubs or Gallops, *** Murmur, ***cardia, *** radial pulses Respiratory: Clear to auscultation bilaterally, *** effort, ***  respiratory rate GI: Soft, nontender, non-distended *** MS: No *** edema; *** moves all extremities Integument: Skin feels *** Neuro:  At time of evaluation, alert and oriented to person/place/time/situation *** Psych: Normal affect, patient feels ***   ASSESSMENT AND PLAN: .     Heart Failure *** Ejection Fraction (systolic/diastolic/combined systolic and diastolic***) - acute *** chronic  *** acute on chronic - NYHA class ***, Stage ***, ***volemic, etiology from *** - Diuretic regimen: Lasix/torsemide/Bumex +/- metolazone ***  - Discussed the importance of fluid restriction of < 2 L, salt restriction, and checking daily weights  - *** Replace electrolytes PRN and keep K>4 and Mg>2. - *** BMP/BNP/Mg   - Coreg/bisoprolol/metoprolol***  - ARNI/ARB/ACEi*** - SGLT2i*** - aldactone/eplerenone***  - isordil/hydralazine for afterload reduction***  - on optimally titrated GDMT or with non hepatic/geriatic barriers to GDMT (Renal/hypotension  issues), will consider Vericiguat 2.5-> 5-> 10 - ivabradine *** - digoxin ***  - TTE ordered *** - CMR ordered *** - Amyloid study/PYP ordered*** - Query of familial component: Family history notable for *** - CK ***  - Device Indications: ***  *** Lifevest considerations - Clinical Trials/Barostim: *** - Indication for AHF: ***  LVEF < 30%***  - Supervised exercise therapy ***  -Transferring Sat %/Ferritin *** - HIV- *** - RF and ANA *** - SPEP ***    Riley Lam, MD FASE [facility] Cardiologist [facility]  [address] [place], [address] [place], [place] [address] [phone]  11:58 AM

## 2023-04-10 NOTE — Telephone Encounter (Signed)
Sending refill request for Zofran to Jan, CMA. Thanks

## 2023-04-11 ENCOUNTER — Ambulatory Visit: Payer: Medicare Other | Admitting: Internal Medicine

## 2023-04-15 ENCOUNTER — Telehealth: Payer: Self-pay | Admitting: Gastroenterology

## 2023-04-15 MED ORDER — PREDNISONE 10 MG PO TABS
ORAL_TABLET | ORAL | 0 refills | Status: DC
Start: 2023-04-15 — End: 2023-05-02

## 2023-04-15 NOTE — Telephone Encounter (Signed)
PT started antibiotics for c diff and had no symptoms but as of the weekend she has had increased diarrhea and would like to discuss other options for relief. Please advise.

## 2023-04-15 NOTE — Telephone Encounter (Signed)
The question at this time is whether the C Diff is driving her symptoms, her colitis, or possibly combination of both.  If she had significant relief with antibiotics initially I do think C diff is driving a lot of this. For management of that I defer to ID at this point and she is seeing them soon. 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 Let's see how the next 48 hours so and what ID says. If she is getting worse despite vancomycin again may need to add back prednisone

## 2023-04-15 NOTE — Telephone Encounter (Signed)
Dr. Adela Lank, please see note below. Pt has an appt scheduled with ID on 04/17/23 at 9 am. Please advise, thanks.

## 2023-04-15 NOTE — Telephone Encounter (Signed)
Okay. Well, the reason we retested for C diff was that she contacted [place] on 8/21 with recurrent diarrhea post Vowst and concern for C Diff.  That being said this current diarrhea could be multifactorial but she did have C Diff on repeat testing. Continue vancomycin until her visit with ID.   If she is not on any prednisone right now, can place her back on 20mg  prednisone per day, see if we can get benefit without using higher dose. Can you give her 20mg  / day for 2 weeks followed by taper of 5mg  / week until done. Will await her evaluation with ID.

## 2023-04-15 NOTE — Telephone Encounter (Signed)
Called and spoke with patient regarding recommendations below. Pt advised to continue Vancomycin as prescribed and she is aware that we have sent Prednisone taper to her pharmacy. Otherwise, await further recommendations from ID. Pt verbalized understanding and had no concerns at the end of the call.

## 2023-04-15 NOTE — Telephone Encounter (Signed)
Returned call to patient. Pt states that she barely had symptoms when we told her that her C. Diff test was positive. Pt reports that since starting the Vancomycin her diarrhea has worsened significantly. Pt reports having about 10-15 stools / day at this time. Pt states that she is tired from having so many stools. Pt is drinking Pedialyte daily. Pt would like relief at this time, wishing to avoid Prednisone but thinks that is the only thing that will help right now. I informed pt that it is important that the C. Diff infection has been eradicated before we can transition her to a different biologic therapy. Please advise, thanks.

## 2023-04-17 ENCOUNTER — Telehealth: Payer: Self-pay

## 2023-04-17 ENCOUNTER — Ambulatory Visit (INDEPENDENT_AMBULATORY_CARE_PROVIDER_SITE_OTHER): Payer: Medicare Other | Admitting: Infectious Disease

## 2023-04-17 ENCOUNTER — Other Ambulatory Visit: Payer: Self-pay

## 2023-04-17 ENCOUNTER — Encounter: Payer: Self-pay | Admitting: Infectious Disease

## 2023-04-17 VITALS — BP 123/75 | HR 64 | Temp 97.5°F | Ht 62.0 in | Wt 132.0 lb

## 2023-04-17 DIAGNOSIS — A0472 Enterocolitis due to Clostridium difficile, not specified as recurrent: Secondary | ICD-10-CM

## 2023-04-17 DIAGNOSIS — R159 Full incontinence of feces: Secondary | ICD-10-CM

## 2023-04-17 DIAGNOSIS — K5090 Crohn's disease, unspecified, without complications: Secondary | ICD-10-CM

## 2023-04-17 DIAGNOSIS — K50819 Crohn's disease of both small and large intestine with unspecified complications: Secondary | ICD-10-CM

## 2023-04-17 DIAGNOSIS — K50119 Crohn's disease of large intestine with unspecified complications: Secondary | ICD-10-CM | POA: Diagnosis not present

## 2023-04-17 DIAGNOSIS — A0471 Enterocolitis due to Clostridium difficile, recurrent: Secondary | ICD-10-CM | POA: Diagnosis not present

## 2023-04-17 HISTORY — DX: Crohn's disease, unspecified, without complications: K50.90

## 2023-04-17 NOTE — Progress Notes (Signed)
Subjective:  Chief complaint follow-up for recurrent C. difficile  Patient ID: [name], female    DOB: 1940-11-11, 82 y.o.   MRN: [id number]  HPI  82 year old lady followed by my partner Dr. Elinor Parkinson.  Has a past medical history significant for Renal ca s/p partial nephrectomy, GERD, esophageal stricture, PUD/H. pylori gastritis, hypertension, OA, PVD, CVA w memory loss who had a prolonged    hospitalization in late November 2023.  CT scan at that time showed severe dilation of the colon with a large amount of stool and wall thickening.  She underwent exploratory laparotomy on 11/24 due to concern for perforated bowel.  There was no ischemic bowel found at that time and no obvious source of infection.  However, it was presumed that she had some sort of infectious vs inflammatory colitis. She was given broad spectrum antibiotics.  Is then discharged to a skilled nursing facility where she developed profuse diarrhea requiring hospital admission in January she was then admitted in January despite having been on vancomycin.    It was unclear as to whether or not she truly had C. difficile and had no improvement on vancomycin.  She underwent infectious workup at that time including C. difficile and GI pathogen panel which were negative.  She had a flex sigmoidoscopy showing severe inflammation.  Biopsies were concerning for Crohn's disease. She was started on steroids in the hospital and her symptoms significantly improved. She was transitioned to prednisone and Lialda and discharged.    She had another recurrence of diarrhea and had a positive C. difficile toxigenic PCR on 10/23/2022.  This was treated with vancomycin 125 mg 4 times daily x 10 days by her gastroenterologist with improvement. She was again treated with 10 days course of Fidaxomicin when she was admitted early April 2024 for generalized weakness/diarrhea ( 4/5 GHD ag + and toxin -). She was also being treated for LUE cellulitis with  abtx at that time. She was continued on Lialda. She had another C diff test done on 6/6 when she was seen in the GI office for worsening of her diarrhea, low grade fevers, fatigue. C diff toxin A and B EIA was negative, however toxin B was positive, significantly elevated fecal calprotectiin. She was started on a prolonged tapering course of PO Vancomycin by GI and continued on steroids as well. I she was then referred to our clinic and seen by my partner Dr. Elinor Parkinson  The time she was seen she was feeling better with having about 2-3 soft stools per day.  The patient ultimately underwent treatment with Vowst which was not cheap costing insurance 18,000 and she had a $2000 co-pay which patient assistance program was able to help her with.  Unfortunately she developed diarrhea not long afterwards and her C. difficile seal toxin test was again positive   Been started on vancomycin with a pulse and taper regimen and is also on a prednisone taper.  She just barely started the vancomycin yesterday and is still having movements a day.  She has not had an infusion of Zinplava.    Past Medical History:  Diagnosis Date   C. difficile colitis    Cancer (HCC) 2014   kidney   COVID    Esophageal stricture    Fibromyalgia    GERD (gastroesophageal reflux disease)    Helicobacter pylori gastritis    Hypertension    Intention tremor    Lumbago    Memory loss  Osteoarthritis of spine    Pneumonia    hx of years ago    PUD (peptic ulcer disease)    PVD (peripheral vascular disease) (HCC)     Past Surgical History:  Procedure Laterality Date   ABDOMINAL HYSTERECTOMY  1985   BIOPSY  08/30/2022   Procedure: BIOPSY;  Surgeon: Benancio Deeds, MD;  Location: [facility];  Service: Gastroenterology;;   BREAST ENHANCEMENT SURGERY  1974   CATARACT EXTRACTION Bilateral 2014   shapiro   COLONOSCOPY WITH PROPOFOL N/A 08/30/2022   Procedure: COLONOSCOPY WITH PROPOFOL;  Surgeon: Benancio Deeds, MD;  Location: [facility] ENDOSCOPY;  Service: Gastroenterology;  Laterality: N/A;   LAPAROSCOPIC PARTIAL NEPHRECTOMY Left April '13   [facility], Dr. Laverle Patter   LAPAROTOMY N/A 07/05/2022   Procedure: EXPLORATORY LAPAROTOMY;  Surgeon: Andria Meuse, MD;  Location: [facility] OR;  Service: General;  Laterality: N/A;   LUMBAR FUSION  03/2003   L4-5   TONSILLECTOMY  1958   TUBAL LIGATION  1984    Family History  Problem Relation Age of Onset   Heart disease Mother    COPD Mother    Thyroid disease Mother    Diabetes Father    Heart disease Father        CABG   Stroke Father    Parkinson's disease Father    Heart disease Brother    Stroke Brother    Hyperlipidemia Brother    Diabetes Daughter    Hearing loss Brother    Crohn's disease Other        neice   Hypertension Son    Diabetes Son    Melanoma Son    Colon cancer Neg Hx    Stomach cancer Neg Hx    Esophageal cancer Neg Hx       Social History   Socioeconomic History   Marital status: Widowed    Spouse name: Not on file   Number of children: 2   Years of education: BA   Highest education level: Not on file  Occupational History   Occupation: retired- [employer]   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  Tobacco Use   Smoking status: Never   Smokeless tobacco: Never  Vaping Use   Vaping status: Never Used  Substance and Sexual Activity   Alcohol use: Not Currently    Alcohol/week: 7.0 standard drinks of alcohol    Types: 7 Glasses of wine per week    Comment: Patient no longer drinks alcohol due to her Chrohn's disease   Drug use: No   Sexual activity: Not Currently  Other Topics Concern   Not on file  Social History Narrative   Lives alone   Right Handed   Drinks 2 cups coffee daily   Social Determinants of Health   Financial Resource Strain: Not on file  Food Insecurity: No Food Insecurity (11/10/2022)   Hunger Vital Sign    Worried About Running Out of Food in the Last Year: Never true    Ran  Out of Food in the Last Year: Never true  Transportation Needs: No Transportation Needs (11/10/2022)   PRAPARE - Administrator, Civil Service (Medical): No    Lack of Transportation (Non-Medical): No  Physical Activity: Not on file  Stress: Not on file  Social Connections: Not on file    Allergies  Allergen Reactions   Morphine And Codeine Itching and Rash    Given in IV.     Current  Outpatient Medications:    nystatin (MYCOSTATIN) 100000 UNIT/ML suspension, Take 6 mLs (600,000 Units total) by mouth 4 (four) times daily. Take 6-4 mls by mouth 4 times daily. Swish in mouth and hold as long as possible before swallowing for 7 days., Disp: 60 mL, Rfl: 0   acetaminophen (TYLENOL) 325 MG tablet, Take 650 mg by mouth 2 (two) times daily as needed for mild pain or headache., Disp: , Rfl:    ALPRAZolam (XANAX) 0.5 MG tablet, Take 1 tablet (0.5 mg total) by mouth 2 (two) times daily as needed. for anxiety (Patient taking differently: Take 0.5 mg by mouth at bedtime. for anxiety), Disp: 5 tablet, Rfl: 1   Carboxymethylcellulose Sodium (EYE DROPS OP), Place 2 drops into both eyes in the morning and at bedtime., Disp: , Rfl:    DULoxetine (CYMBALTA) 60 MG capsule, Take 60 mg by mouth daily., Disp: , Rfl:    famotidine (PEPCID) 20 MG tablet, Take 1 tablet (20 mg total) by mouth daily., Disp: 30 tablet, Rfl: 3   furosemide (LASIX) 20 MG tablet, Take 1 tablet (20 mg total) by mouth daily., Disp: 90 tablet, Rfl: 2   iron polysaccharides (NIFEREX) 150 MG capsule, Take 150 mg by mouth 2 (two) times daily., Disp: , Rfl:    levothyroxine (SYNTHROID) 88 MCG tablet, Take 88 mcg by mouth every morning., Disp: , Rfl:    mesalamine (LIALDA) 1.2 g EC tablet, Take 2 tablets (2.4 g total) by mouth in the morning and at bedtime., Disp: 180 tablet, Rfl: 3   metoprolol succinate (TOPROL-XL) 25 MG 24 hr tablet, Take 12.5 mg by mouth daily., Disp: , Rfl:    Multiple Vitamins-Iron (MULTIVITAMIN/IRON PO), Take  1 tablet by mouth 2 (two) times daily., Disp: , Rfl:    nystatin (MYCOSTATIN/NYSTOP) powder, Apply 1 Application topically 3 (three) times daily., Disp: 15 g, Rfl: 0   omeprazole (PRILOSEC) 40 MG capsule, Take 1 capsule (40 mg total) by mouth every other day. then transition to Pepcid 20 mg, Disp: , Rfl:    ondansetron (ZOFRAN-ODT) 4 MG disintegrating tablet, Take 1 tablet (4 mg total) by mouth every 8 (eight) hours as needed for nausea or vomiting. Please keep your Sept appointment for further refills. Thanks., Disp: 30 tablet, Rfl: 0   predniSONE (DELTASONE) 10 MG tablet, Take 2 tablets (20 mg total) by mouth daily with breakfast for 14 days, THEN 1.5 tablets (15 mg total) daily with breakfast for 7 days, THEN 1 tablet (10 mg total) daily with breakfast for 7 days, THEN 0.5 tablets (5 mg total) daily with breakfast for 7 days., Disp: 49 tablet, Rfl: 0   rosuvastatin (CRESTOR) 10 MG tablet, Take 10 mg by mouth every evening., Disp: , Rfl:    vancomycin (VANCOCIN) 125 MG capsule, Take 1 capsule (125 mg total) by mouth 4 (four) times daily for 14 days, THEN 1 capsule (125 mg total) in the morning and at bedtime for 7 days, THEN 1 capsule (125 mg total) daily for 7 days, THEN 1 capsule (125 mg total) every other day for 14 days., Disp: 84 capsule, Rfl: 0   Review of Systems  Constitutional:  Negative for activity change, appetite change, chills, diaphoresis, fatigue, fever and unexpected weight change.  HENT:  Negative for congestion, rhinorrhea, sinus pressure, sneezing, sore throat and trouble swallowing.   Eyes:  Negative for photophobia and visual disturbance.  Respiratory:  Negative for cough, chest tightness, shortness of breath, wheezing and stridor.   Cardiovascular:  Negative  for chest pain, palpitations and leg swelling.  Gastrointestinal:  Positive for diarrhea. Negative for abdominal distention, abdominal pain, anal bleeding, blood in stool, constipation, nausea and vomiting.   Genitourinary:  Negative for difficulty urinating, dysuria, flank pain and hematuria.  Musculoskeletal:  Negative for arthralgias, back pain, gait problem, joint swelling and myalgias.  Skin:  Negative for color change, pallor, rash and wound.  Neurological:  Negative for dizziness, tremors, weakness and light-headedness.  Hematological:  Negative for adenopathy. Does not bruise/bleed easily.  Psychiatric/Behavioral:  Negative for agitation, behavioral problems, confusion, decreased concentration, dysphoric mood and sleep disturbance.        Objective:   Physical Exam Constitutional:      General: She is not in acute distress.    Appearance: Normal appearance. She is well-developed. She is not ill-appearing or diaphoretic.  HENT:     Head: Normocephalic and atraumatic.     Right Ear: Hearing and external ear normal.     Left Ear: Hearing and external ear normal.     Nose: No nasal deformity or rhinorrhea.  Eyes:     General: No scleral icterus.    Conjunctiva/sclera: Conjunctivae normal.     Right eye: Right conjunctiva is not injected.     Left eye: Left conjunctiva is not injected.     Pupils: Pupils are equal, round, and reactive to light.  Neck:     Vascular: No JVD.  Cardiovascular:     Rate and Rhythm: Normal rate and regular rhythm.     Heart sounds: S1 normal and S2 normal.  Pulmonary:     Effort: Pulmonary effort is normal. No respiratory distress.     Breath sounds: No wheezing.  Abdominal:     General: Bowel sounds are normal. There is no distension.     Palpations: Abdomen is soft.     Tenderness: There is no abdominal tenderness.  Musculoskeletal:        General: Normal range of motion.     Right shoulder: Normal.     Left shoulder: Normal.     Cervical back: Normal range of motion and neck supple.     Right hip: Normal.     Left hip: Normal.     Right knee: Normal.     Left knee: Normal.  Lymphadenopathy:     Head:     Right side of head: No  submandibular, preauricular or posterior auricular adenopathy.     Left side of head: No submandibular, preauricular or posterior auricular adenopathy.     Cervical: No cervical adenopathy.     Right cervical: No superficial or deep cervical adenopathy.    Left cervical: No superficial or deep cervical adenopathy.  Skin:    General: Skin is warm and dry.     Coloration: Skin is not pale.     Findings: No abrasion, bruising, ecchymosis, erythema, lesion or rash.     Nails: There is no clubbing.  Neurological:     Mental Status: She is alert and oriented to person, place, and time.     Sensory: No sensory deficit.     Coordination: Coordination normal.     Gait: Gait normal.  Psychiatric:        Attention and Perception: She is attentive.        Mood and Affect: Mood normal.        Speech: Speech normal.        Behavior: Behavior normal. Behavior is cooperative.  Thought Content: Thought content normal.        Judgment: Judgment normal.           Assessment & Plan:   Recurrent C. difficile colitis:  I agree that her C. difficile testing is not always been conclusive for infection with the toxin assay not having always been performed and some of the diagnoses being made based on PCR testing for the presence of toxigenic C. difficile which does not distinguish between colonization and infection.  However she has several episodes with clear-cut toxin identification including most recently.  She also has comorbid Crohn's disease and is need of immunosuppressive therapy.  Thank certainly if it is possible for her to have a fecal transplantation that this might be one of the best maneuvers we can make for her.  [facility] the only program that I know of doing it currently so we will work on a referral to their practice.  In the interim I think it is quite reasonable for her to continue on her current vancomycin pulse and taper along with prednisone's per direction of  GI.  I am also scheduling her to see Dr. Elinor Parkinson at the end of September.  Finally I am see if we can set her up for an infusion of Zinplava.  I have personally spent 43 minutes involved in face-to-face and non-face-to-face activities for this patient on the day of the visit. Professional time spent includes the following activities: Preparing to see the patient (review of tests), Obtaining and/or reviewing separately obtained history (admission/discharge record), Performing a medically appropriate examination and/or evaluation , Ordering medications/tests/procedures, referring and communicating with other health care professionals, Documenting clinical information in the EMR, Independently interpreting results (not separately reported), Communicating results to the patient/family/caregiver, Counseling and educating the patient/family/caregiver and Care coordination (not separately reported).

## 2023-04-17 NOTE — Telephone Encounter (Signed)
Per Dr. Daiva Eves patient will need a Zinplava infusion x1.  I have faxed the order to short stay and LVM for them to call back with an appointment. Patient does not need Authorization for this and has a 20 % coinsurance to pay. I have advised the patient of 20% coinsurance. Waiting for appointment. [name] Jonathon Resides, CMA

## 2023-04-17 NOTE — Telephone Encounter (Signed)
This is a pt of Dr Adela Lank will send to his nurse

## 2023-04-17 NOTE — Telephone Encounter (Signed)
Inbound call from patient inquiring is she can have her flu shot and also her covid booster. Please advise.   Thank you

## 2023-04-18 NOTE — Telephone Encounter (Signed)
Yes but she may want to wait a few weeks until she is on a lower dose of prednisone or finishing it. Thanks

## 2023-04-18 NOTE — Telephone Encounter (Signed)
Called and left patient a detailed vm letting her know that she can proceed with Flu vaccine and COVID booster, but she should wait until she is closer to finishing her Prednisone taper.

## 2023-04-18 NOTE — Telephone Encounter (Signed)
Patient scheduled for Zinplava infusion 04/28/23 with short stay. [name] T Pricilla Loveless

## 2023-04-18 NOTE — Telephone Encounter (Signed)
Yes I spoke with her ID physician after their consultation and discussed her case. I agree with the plan. Thank you!

## 2023-04-18 NOTE — Telephone Encounter (Signed)
Pt returned call, she did not listen to vm. I reviewed Dr. Lanetta Inch recommendations with her. Pt wanted to update [place] on plan from ID. They are attempting to get approval so that patient can receive Zinplava, if ineffective next step will be referral to [facility] for fecal transplant. Pt wanted to know who should complete PA for Zinplava, I informed her that PA will need to be completed by ordering physician at ID office. Pt will call them to follow up, her infusion is already scheduled for 04/28/23. Pt wanted to know if Dr. Adela Lank is OK with this plan, I informed pt that he referred her to ID for further treatment since she has failed multiple rounds of vancomycin and Vowst. Pt is aware that Dr. Adela Lank would notify her if he had concerns regarding plan from ID. Pt verbalized understanding and had no concerns at the end of the call.

## 2023-04-21 NOTE — Telephone Encounter (Signed)
Tyrica called wanting specific price breakdown for Zinplava.   Spoke with Debbie A. At [facility] Medicare. [place] will be responsible for 20% coinsurance of the drug along with $295 copayment for outpatient IV infusion.   However, patient has met out of pocket maximum for policy year and according to [place], [place] should cover 100% of remaining costs.   Last 4 of reference number [id number] J-codes J0565 CPT 95284-13244 ICD-10 A04.71-A04.72  Sandie Ano, RN

## 2023-04-21 NOTE — Telephone Encounter (Signed)
PT is calling because after being on Vancomycin 14 days and Prednisone 5 days she still has severe liquid diarrhea. She is very concerned Please advise.

## 2023-04-21 NOTE — Telephone Encounter (Signed)
Zinplava infusion is scheduled for 04/28/23. Should pt be redirected to ID?

## 2023-04-22 ENCOUNTER — Other Ambulatory Visit: Payer: Self-pay | Admitting: Endocrinology

## 2023-04-22 DIAGNOSIS — E440 Moderate protein-calorie malnutrition: Secondary | ICD-10-CM

## 2023-04-22 DIAGNOSIS — K50919 Crohn's disease, unspecified, with unspecified complications: Secondary | ICD-10-CM

## 2023-04-22 DIAGNOSIS — M810 Age-related osteoporosis without current pathological fracture: Secondary | ICD-10-CM

## 2023-04-22 NOTE — Telephone Encounter (Signed)
Dr. Daiva Eves, can you please see notes below regarding mutual patient that you saw on 04/17/23. Ok for her to proceed with Zinplava while on Prednisone?

## 2023-04-22 NOTE — Telephone Encounter (Signed)
Okay thanks for clarifying. If she is still on vancomycin and it made her symptoms no better I wonder if IBD is driving her symptoms. She should stay on the vancomycin, but would like to increase her prednisone to 40mg  / day for the next week to see if that will help her. Can you see if she is willing to do that? If so I'd like to hear back from her in a week or so for reassessment. She should ask her ID if she should proceed with Zinplava on 9/16 with the prednisone on board. Thanks for your help with this.

## 2023-04-22 NOTE — Telephone Encounter (Signed)
Spoke with patient. Pt completed 14 days of Vancomycin 125 QID and she started Vancomycin 125 mg BID today and she is on day 6 of Prednisone 20 mg daily. Vancomycin made diarrhea worse, no improvement at all. Pt has an appt for Zinplava infusion on 04/28/23, then consult with [facility] on 05/06/23 to discuss fecal transplant, pt follow up with ID on 05/08/23, and then sees you on 05/09/23. Please advise, thanks.

## 2023-04-23 NOTE — Telephone Encounter (Signed)
Called and spoke with patient. Pt will continue Vancomycin as prescribed. Pt will begin Prednisone 40 mg daily for 1 week starting tomorrow. Pt will call us next Friday with a symptom update. Pt knows to keep Zinplava infusion as scheduled. Pt verbalized understanding and had no concerns at the end of the call.

## 2023-04-25 ENCOUNTER — Other Ambulatory Visit (HOSPITAL_COMMUNITY): Payer: Self-pay | Admitting: *Deleted

## 2023-04-28 ENCOUNTER — Ambulatory Visit (HOSPITAL_COMMUNITY)
Admission: RE | Admit: 2023-04-28 | Discharge: 2023-04-28 | Disposition: A | Discharge status: Home / Self Care | Payer: Medicare Other | Source: Ambulatory Visit | Attending: Infectious Disease | Admitting: Infectious Disease

## 2023-04-28 DIAGNOSIS — A0471 Enterocolitis due to Clostridium difficile, recurrent: Secondary | ICD-10-CM | POA: Insufficient documentation

## 2023-04-28 MED ORDER — SODIUM CHLORIDE 0.9 % IV SOLN
600.0000 mg | Freq: Once | INTRAVENOUS | Status: AC
  Administered 2023-04-28: 600 mg via INTRAVENOUS
  Filled 2023-04-28: qty 24

## 2023-04-29 ENCOUNTER — Telehealth: Payer: Self-pay

## 2023-04-29 NOTE — Telephone Encounter (Signed)
Patient left a message with questions about her Zinplava infusion. States she received the infusion and everything went fine, but she'd like to know if this will stop her recurrent C.diff infections.   She states "I know it has antibodies," but wants to know what the purpose of the infusion is and if she'll have to get additional infusions or if it's just a "one and done." Will route to provider.   Sandie Ano, RN

## 2023-04-29 NOTE — Telephone Encounter (Signed)
Spoke with Alona Bene, relayed Dr. Clinton Gallant message. Patient verbalized understanding and has no further questions.   Sandie Ano, RN

## 2023-04-30 ENCOUNTER — Ambulatory Visit
Admission: RE | Admit: 2023-04-30 | Discharge: 2023-04-30 | Disposition: A | Discharge status: Home / Self Care | Payer: Medicare Other | Source: Ambulatory Visit | Attending: Endocrinology | Admitting: Endocrinology

## 2023-04-30 DIAGNOSIS — M810 Age-related osteoporosis without current pathological fracture: Secondary | ICD-10-CM

## 2023-04-30 DIAGNOSIS — K50919 Crohn's disease, unspecified, with unspecified complications: Secondary | ICD-10-CM

## 2023-04-30 DIAGNOSIS — E440 Moderate protein-calorie malnutrition: Secondary | ICD-10-CM

## 2023-05-02 MED ORDER — PREDNISONE 10 MG PO TABS: ORAL_TABLET | ORAL | 0 refills | Status: DC

## 2023-05-02 NOTE — Telephone Encounter (Signed)
Prednisone 30 mg qd x 5 days then 20 mg qd x 5 days. Further taper and advice per SA.

## 2023-05-02 NOTE — Addendum Note (Signed)
Addended by: Missy Sabins on: 05/02/2023 11:45 AM   Modules accepted: Orders

## 2023-05-02 NOTE — Telephone Encounter (Signed)
Inbound call from patient, states her diarrhea has resolved and stools has formed. Patient also states she is completely out of prednisone and needs an urgent refill. Patient states she was advised she would have to taper off of it instead of stopping all together. She needs the prescription sent to [facility] on [address].

## 2023-05-02 NOTE — Telephone Encounter (Signed)
Called and left patient detailed vm letting her know that Dr. Adela Lank is out of the office until Tuesday. I advised of Dr. Ardell Isaacs recommendations in the interim. RX has been sent to requested pharmacy.

## 2023-05-02 NOTE — Telephone Encounter (Signed)
Dr. Russella Dar as DOD AM of 05/02/23   Dr. Lanetta Inch pt with recurrent C. Diff and overlapping IBD. Failed vowst. Pt was being treated with extended Vancomycin taper and Prednisone taper. Pt called about 10 days ago and Dr. Adela Lank increased Prednisone to 40 mg for 1 week, in the interim patient did receive Zinplava infusion ordered by her ID provider. Pt has used current supply of Prednisone and needs to taper off. Please advise on taper. Thanks

## 2023-05-03 NOTE — Telephone Encounter (Signed)
Thanks Gans, I am just catching up on this now. Glad she is feeling better but she has had a really difficult course and has been difficult to sort out of C Diff or IBD was causing her symptoms. Given recent course more concerned about the IBD. I'd like to taper her prednisone over a bit longer period as she just flared on a lower dose. I do think we will need to transition to a biologic at some point in time. Can you put her on 30mg  / day for 7 days, then taper by 5mg  / week until done. I'm seeing her next week and can discuss long term plan at that time. thanks

## 2023-05-05 ENCOUNTER — Telehealth: Payer: Self-pay

## 2023-05-05 MED ORDER — PREDNISONE 10 MG PO TABS: ORAL_TABLET | ORAL | 0 refills | Status: AC

## 2023-05-05 NOTE — Telephone Encounter (Signed)
Called and spoke with patient regarding update of Prednisone taper. Pt knows to extend Prednisone 30 mg for an additional 2 days (7 days total) and knows that I have sent an updated prescription to [facility] so that she can decrease Prednisone by 5 mg/ week until complete. Pt will come in for labs tomorrow (CBC and iron studies) prior to her OV on 05/09/23. Pt is aware that long term treatment plan will be discussed at the time of her OV. Pt verbalized understanding and had no concerns at the end of the call.

## 2023-05-05 NOTE — Telephone Encounter (Signed)
Inbound call from [place] with [facility] with a call back number at [phone], in regards to prednisone prescription.   States they have received several prescription and are unsure which one is correct.   [name]  can be reached before 1 pm and af ter you can speak with jane.   Please advise. Thank you

## 2023-05-05 NOTE — Telephone Encounter (Signed)
Returned call to [place] at [facility] pharmacy to clarify changes in Prednisone prescriptions. We have had to adjust dosing based on patient's symptoms, Lynden Ang is not sure if insurance will approve a fill so soon. Lynden Ang will contact patient regarding this, she may have to instruct her to use any extra Prednisone that she already has at home to cover her until new prescription can be filled. I informed Lynden Ang that I did speak with patient this morning regarding change in therapy and pt does have an appt later this week.

## 2023-05-05 NOTE — Telephone Encounter (Signed)
-----   Message from [facility] sent at 04/15/2023 10:13 AM EDT ----- Regarding: DUE FOR LABS this week  ----- Message ----- From: [place], Patti E, CMA Sent: 04/15/2023   8:00 AM EDT To: Cooper Render, CMA  Please contact patient to come have CBC, TIBC and Ferritin drawn per Dr Janene Harvey. Due around September 25th 2024. Thank you so much. Orders in epic.

## 2023-05-05 NOTE — Addendum Note (Signed)
Addended by: Missy Sabins on: 05/05/2023 10:15 AM   Modules accepted: Orders

## 2023-05-05 NOTE — Telephone Encounter (Signed)
Patient indicates daughter is bringing her to get labs tomorrow

## 2023-05-06 ENCOUNTER — Other Ambulatory Visit (INDEPENDENT_AMBULATORY_CARE_PROVIDER_SITE_OTHER): Payer: Medicare Other

## 2023-05-06 DIAGNOSIS — K50119 Crohn's disease of large intestine with unspecified complications: Secondary | ICD-10-CM

## 2023-05-06 DIAGNOSIS — D649 Anemia, unspecified: Secondary | ICD-10-CM | POA: Diagnosis not present

## 2023-05-06 LAB — CBC WITH DIFFERENTIAL/PLATELET
Basophils Absolute: 0 10*3/uL (ref 0.0–0.1)
Basophils Relative: 0.2 % (ref 0.0–3.0)
Eosinophils Absolute: 0.1 10*3/uL (ref 0.0–0.7)
Eosinophils Relative: 0.8 % (ref 0.0–5.0)
HCT: 29.9 % — ABNORMAL LOW (ref 36.0–46.0)
Hemoglobin: 9.5 g/dL — ABNORMAL LOW (ref 12.0–15.0)
Lymphocytes Relative: 20.4 % (ref 12.0–46.0)
Lymphs Abs: 1.6 10*3/uL (ref 0.7–4.0)
MCHC: 31.7 g/dL (ref 30.0–36.0)
MCV: 89.8 fl (ref 78.0–100.0)
Monocytes Absolute: 0.3 10*3/uL (ref 0.1–1.0)
Monocytes Relative: 3.5 % (ref 3.0–12.0)
Neutro Abs: 5.7 10*3/uL (ref 1.4–7.7)
Neutrophils Relative %: 75.1 % (ref 43.0–77.0)
Platelets: 414 10*3/uL — ABNORMAL HIGH (ref 150.0–400.0)
RBC: 3.33 Mil/uL — ABNORMAL LOW (ref 3.87–5.11)
RDW: 16.5 % — ABNORMAL HIGH (ref 11.5–15.5)
WBC: 7.6 10*3/uL (ref 4.0–10.5)

## 2023-05-06 NOTE — Addendum Note (Signed)
Addended by: Ninfa Meeker A on: 05/06/2023 10:29 AM   Modules accepted: Orders

## 2023-05-07 LAB — IRON,TIBC AND FERRITIN PANEL
%SAT: 9 % (calc) — ABNORMAL LOW (ref 16–45)
Ferritin: 22 ng/mL (ref 16–288)
Iron: 29 ug/dL — ABNORMAL LOW (ref 45–160)
TIBC: 313 mcg/dL (calc) (ref 250–450)

## 2023-05-07 NOTE — Telephone Encounter (Signed)
Patient called to advise the office that she will be canceling her appt with Dr. Adela Lank for the 05/09/23. Said she went to [facility] and saw Dr. Tilman Neat and will start her Remacade infusion treatment with him for her Crohn's. She also stated she would still want Dr. Adela Lank to be her doctor. Please advise.

## 2023-05-07 NOTE — Telephone Encounter (Signed)
Called and spoke to patient.  She indicated she saw Dr. Jenita Seashore at [facility] who is a Crohn's specialist; She would like to follow with him to treat her Crohn's since he believes that is driving her recurrent Cdiff.  She said she was not unhappy at all with the care she received from Dr. Adela Lank but she was not aware he is a IBD specialist.  I let her know that Dr. Adela Lank intended to discuss starting her on Remicade at her appointment this week. I let her know she needs to follow with only one GI specialist and she expressed understanding.

## 2023-05-07 NOTE — Telephone Encounter (Signed)
Thanks [name], up to her where she wants to receive care at this point, where she is most comfortable..  We had not had a chance to start her on biologic due to her recurrent C. difficile.  I was hoping to chat with her this week about it.  She can follow-up with me in the future if she changes her mind and prefers to come here at some point down the line.

## 2023-05-08 ENCOUNTER — Ambulatory Visit: Payer: Medicare Other | Admitting: Infectious Diseases

## 2023-05-09 ENCOUNTER — Ambulatory Visit: Payer: Medicare Other | Admitting: Gastroenterology

## 2023-05-13 ENCOUNTER — Other Ambulatory Visit: Payer: Self-pay | Admitting: Gastroenterology

## 2023-05-14 ENCOUNTER — Ambulatory Visit: Payer: Medicare Other | Admitting: Infectious Diseases

## 2023-06-10 ENCOUNTER — Other Ambulatory Visit: Payer: Self-pay | Admitting: Gastroenterology

## 2023-06-10 DIAGNOSIS — K50111 Crohn's disease of large intestine with rectal bleeding: Secondary | ICD-10-CM

## 2023-06-25 ENCOUNTER — Ambulatory Visit (INDEPENDENT_AMBULATORY_CARE_PROVIDER_SITE_OTHER): Payer: Medicare Other

## 2023-06-25 ENCOUNTER — Encounter: Payer: Self-pay | Admitting: Internal Medicine

## 2023-06-25 ENCOUNTER — Ambulatory Visit: Payer: Medicare Other | Attending: Internal Medicine | Admitting: Internal Medicine

## 2023-06-25 VITALS — BP 108/70 | HR 79 | Ht 61.0 in | Wt 131.0 lb

## 2023-06-25 DIAGNOSIS — M7989 Other specified soft tissue disorders: Secondary | ICD-10-CM | POA: Diagnosis not present

## 2023-06-25 DIAGNOSIS — I2510 Atherosclerotic heart disease of native coronary artery without angina pectoris: Secondary | ICD-10-CM

## 2023-06-25 DIAGNOSIS — R600 Localized edema: Secondary | ICD-10-CM

## 2023-06-25 DIAGNOSIS — I491 Atrial premature depolarization: Secondary | ICD-10-CM

## 2023-06-25 DIAGNOSIS — I441 Atrioventricular block, second degree: Secondary | ICD-10-CM | POA: Diagnosis not present

## 2023-06-25 DIAGNOSIS — R9389 Abnormal findings on diagnostic imaging of other specified body structures: Secondary | ICD-10-CM

## 2023-06-25 DIAGNOSIS — E8809 Other disorders of plasma-protein metabolism, not elsewhere classified: Secondary | ICD-10-CM

## 2023-06-25 DIAGNOSIS — E43 Unspecified severe protein-calorie malnutrition: Secondary | ICD-10-CM

## 2023-06-25 NOTE — Progress Notes (Unsigned)
Enrolled patient for a 7 day Zio XT monitor to be mailed to patients home.  

## 2023-06-25 NOTE — Patient Instructions (Signed)
Medication Instructions:  Your physician recommends that you continue on your current medications as directed. Please refer to the Current Medication list given to you today.  *If you need a refill on your cardiac medications before your next appointment, please call your pharmacy*   Lab Work: AFTER 07/28/2023: CMP, BNP  If you have labs (blood work) drawn today and your tests are completely normal, you will receive your results only by: MyChart Message (if you have MyChart) OR A paper copy in the mail If you have any lab test that is abnormal or we need to change your treatment, we will call you to review the results.   Testing/Procedures: Your physician has requested that you wear a heart monitor.    Follow-Up: At [facility], you and your health needs are our priority.  As part of our continuing mission to provide you with exceptional heart care, we have created designated Provider Care Teams.  These Care Teams include your primary Cardiologist (physician) and Advanced Practice Providers (APPs -  Physician Assistants and Nurse Practitioners) who all work together to provide you with the care you need, when you need it.    Your next appointment:   2 month(s)  Provider:   Ronie Spies, PA-C, Tereso Newcomer, PA-C, or Perlie Gold, PA-C      Other Instructions Christena Deem- Long Term Monitor Instructions  Your physician has requested you wear a ZIO patch monitor for 7 days.  This is a single patch monitor. [employer] supplies one patch monitor per enrollment. Additional stickers are not available. Please do not apply patch if you will be having a Nuclear Stress Test,   Cardiac CT, MRI, or Chest Xray during the period you would be wearing the  monitor. The patch cannot be worn during these tests. You cannot remove and re-apply the  ZIO XT patch monitor.  Your ZIO patch monitor will be mailed 3 day USPS to your address on file. It may take 3-5 days  to receive your monitor after you  have been enrolled.  Once you have received your monitor, please review the enclosed instructions. Your monitor  has already been registered assigning a specific monitor serial # to you.  Billing and Patient Assistance Program Information  We have supplied [employer] with any of your insurance information on file for billing purposes. [employer] offers a sliding scale Patient Assistance Program for patients that do not have  insurance, or whose insurance does not completely cover the cost of the ZIO monitor.  You must apply for the Patient Assistance Program to qualify for this discounted rate.  To apply, please call [employer] at [phone], select option 4, select option 2, ask to apply for  Patient Assistance Program. Meredeth Ide will ask your household income, and how many people  are in your household. They will quote your out-of-pocket cost based on that information.  [employer] will also be able to set up a 38-month, interest-free payment plan if needed.  Applying the monitor   Shave hair from upper left chest.  Hold abrader disc by orange tab. Rub abrader in 40 strokes over the upper left chest as  indicated in your monitor instructions.  Clean area with 4 enclosed alcohol pads. Let dry.  Apply patch as indicated in monitor instructions. Patch will be placed under collarbone on left  side of chest with arrow pointing upward.  Rub patch adhesive wings for 2 minutes. Remove white label marked "1". Remove the white  label marked "2". Rub patch  adhesive wings for 2 additional minutes.  While looking in a mirror, press and release button in center of patch. A small green light will  flash 3-4 times. This will be your only indicator that the monitor has been turned on.  Do not shower for the first 24 hours. You may shower after the first 24 hours.  Press the button if you feel a symptom. You will hear a small click. Record Date, Time and  Symptom in the Patient Logbook.  When you are ready to  remove the patch, follow instructions on the last 2 pages of Patient  Logbook. Stick patch monitor onto the last page of Patient Logbook.  Place Patient Logbook in the blue and white box. Use locking tab on box and tape box closed  securely. The blue and white box has prepaid postage on it. Please place it in the mailbox as  soon as possible. Your physician should have your test results approximately 7 days after the  monitor has been mailed back to [facility].  Call [facility] Customer Care at [phone] if you have questions regarding  your ZIO XT patch monitor. Call them immediately if you see an orange light blinking on your  monitor.  If your monitor falls off in less than 4 days, contact our Monitor department at [phone].  If your monitor becomes loose or falls off after 4 days call [employer] at [phone] for  suggestions on securing your monitor

## 2023-06-25 NOTE — Progress Notes (Signed)
Cardiology Office Note:  .    Date:  06/25/2023  ID:  [name], DOB [birth year], MRN [id number] PCP: Kendra Prince, MD  [name] Providers Cardiologist:  None     CC: re-evaluate condition in recovery Consulted for the evaluation of HFpEF at the behest of Dr. Evlyn [name]   History of Present Illness: .    [name] is a 82 y.o. female with PACs and volume overload who presents for evaluation.  [name], an 82 year old patient with a complex medical history, presents for evaluation of coronary artery calcifications and management of hypertension, hyperlipidemia, and aortic atherosclerosis. The patient has a history of labile blood pressure, transient ischemic attack, electrolyte abnormalities including hypoalbuminemia and hyponatremia, and some baseline memory impairment. Additionally, the patient has a history of venous insufficiency with leg swelling. An echocardiogram performed by a different cardiology group revealed elevated pulmonary artery systolic pressures and mild mitral regurgitation.  The patient reports a significant medical event in December 2023 when she suddenly passed out and was found to have a septic colon. She was on life support for two days before undergoing surgery to repair bowel leakage. Post-surgery, the patient was transferred to a rehabilitation facility where she contracted Clostridium difficile (C. diff) infection four times, leading to significant electrolyte imbalances. The patient underwent a fecal transplant via a pill called Neomia Dear, which unfortunately did not resolve the infection. The patient is currently under the care of the Crohn's division at [name] and is receiving infliximab infusions for high inflammation markers in the colon.  Following the surgery and subsequent complications, the patient's lifestyle changed significantly. She was initially wheelchair-bound with significant leg swelling and had to move into assisted care.  However, she has since made significant progress, transitioning from a wheelchair to a walker, and now walks 30 minutes a day. The patient reports that her previous cardiologist identified a "stiff heart," which she believes was due to her immobility and leg swelling post-surgery (Her tissue Doppler indexes were norma). She is hopeful that her cardiac condition is improving now that she is more active.  The patient has been on prednisone since January, which has disrupted her sleep and led to urinary incontinence. She is currently tapering off the prednisone, with the last dose scheduled for December 14th. The patient is also on Lasix, which she takes at 6 AM daily. She is unsure if the Lasix is contributing to her nocturnal incontinence.  Relevant histories: .  Social family in [place] ROS: As per HPI.   Studies Reviewed: .   Cardiac Studies & Procedures       ECHOCARDIOGRAM  ECHOCARDIOGRAM COMPLETE 11/11/2022  Narrative ECHOCARDIOGRAM REPORT    Patient Name:   [name] Date of Exam: 11/11/2022 Medical Rec #:  [id number]       Height:       62.0 in Accession #:    [id number]      Weight:       140.0 lb Date of Birth:  Jan 01, 1941       BSA:          1.643 m Patient Age:    81 years        BP:           114/54 mmHg Patient Gender: F               HR:           91 bpm. Exam Location:  Inpatient  Procedure:  2D Echo, Cardiac Doppler and Color Doppler  Indications:     R94.31 Abnormal EKG  History:         Patient has prior history of Echocardiogram examinations, most recent 07/06/2022. TIA, Signs/Symptoms:Edema, Shortness of Breath, Dyspnea and Dizziness/Lightheadedness; Risk Factors:Hypertension.  Sonographer:     [name] Referring Phys:  [id number] [name] Diagnosing Phys: [name]  IMPRESSIONS   1. Left ventricular ejection fraction, by estimation, is 55 to 60%. Left ventricular ejection fraction by 3D volume is 60 %. The left ventricle has  normal function. The left ventricle has no regional wall motion abnormalities. Left ventricular diastolic parameters are consistent with Grade I diastolic dysfunction (impaired relaxation). 2. Right ventricular systolic function is normal. The right ventricular size is normal. There is moderately elevated pulmonary artery systolic pressure. 3. The mitral valve is grossly normal. Mild mitral valve regurgitation. 4. The aortic valve is calcified. Aortic valve regurgitation is not visualized. Aortic valve sclerosis/calcification is present, without any evidence of aortic stenosis.  FINDINGS Left Ventricle: Left ventricular ejection fraction, by estimation, is 55 to 60%. Left ventricular ejection fraction by 3D volume is 60 %. The left ventricle has normal function. The left ventricle has no regional wall motion abnormalities. The left ventricular internal cavity size was normal in size. There is no left ventricular hypertrophy. Left ventricular diastolic parameters are consistent with Grade I diastolic dysfunction (impaired relaxation).  Right Ventricle: The right ventricular size is normal. No increase in right ventricular wall thickness. Right ventricular systolic function is normal. There is moderately elevated pulmonary artery systolic pressure. The tricuspid regurgitant velocity is 3.18 m/s, and with an assumed right atrial pressure of 15 mmHg, the estimated right ventricular systolic pressure is 55.4 mmHg.  Left Atrium: Left atrial size was normal in size.  Right Atrium: Right atrial size was normal in size.  Pericardium: There is no evidence of pericardial effusion.  Mitral Valve: The mitral valve is grossly normal. Mild mitral valve regurgitation.  Tricuspid Valve: The tricuspid valve is grossly normal. Tricuspid valve regurgitation is mild.  Aortic Valve: The aortic valve is calcified. Aortic valve regurgitation is not visualized. Aortic valve sclerosis/calcification is present, without  any evidence of aortic stenosis.  Pulmonic Valve: The pulmonic valve was grossly normal. Pulmonic valve regurgitation is not visualized.  Aorta: The aortic root is normal in size and structure.  IAS/Shunts: No atrial level shunt detected by color flow Doppler.   LEFT VENTRICLE PLAX 2D LVIDd:         4.60 cm         Diastology LVIDs:         3.20 cm         LV e' medial:   8.05 cm/s LV PW:         0.80 cm         LV E/e' medial: 11.5 LV IVS:        1.30 cm LVOT diam:     2.00 cm LV SV:         67              3D Volume EF LV SV Index:   41              LV 3D EF:    Left LVOT Area:     3.14 cm                     ventricul ar ejection fraction by 3D volume  is 60 %.  3D Volume EF: 3D EF:        60 % LV EDV:       94 ml LV ESV:       37 ml LV SV:        56 ml  RIGHT VENTRICLE             IVC RV S prime:     10.20 cm/s  IVC diam: 2.30 cm TAPSE (M-mode): 1.7 cm  LEFT ATRIUM             Index        RIGHT ATRIUM          Index LA diam:        2.90 cm 1.77 cm/m   RA Area:     9.33 cm LA Vol (A2C):   60.8 ml 37.01 ml/m  RA Volume:   16.20 ml 9.86 ml/m LA Vol (A4C):   29.2 ml 17.77 ml/m LA Biplane Vol: 44.6 ml 27.15 ml/m AORTIC VALVE LVOT Vmax:   110.00 cm/s LVOT Vmean:  77.100 cm/s LVOT VTI:    0.214 m  AORTA Ao Root diam: 2.90 cm Ao Asc diam:  3.00 cm  MITRAL VALVE               TRICUSPID VALVE MV Area (PHT): 4.60 cm    TR Peak grad:   40.4 mmHg MV Decel Time: 165 msec    TR Vmax:        318.00 cm/s MV E velocity: 92.70 cm/s MV A velocity: 83.20 cm/s  SHUNTS MV E/A ratio:  1.11        Systemic VTI:  0.21 m Systemic Diam: 2.00 cm  Designer, multimedia signed by Clotilde Dieter Signature Date/Time: 11/11/2022/2:53:01 PM    Final               Physical Exam:    VS:  BP 108/70   Pulse 79   Ht 5\' 1"  (1.549 m)   Wt 131 lb (59.4 kg)   BMI 24.75 kg/m    Wt Readings from Last 3 Encounters:  06/25/23 131 lb (59.4 kg)  04/28/23 125 lb (56.7  kg)  04/17/23 132 lb (59.9 kg)    Gen: No distress  Neck: No JVD Cardiac: No Rubs or Gallops, No Murmur, RRR +2 radial pulses Respiratory: Clear to auscultation bilaterally, normal effort, normal  respiratory rate GI: Soft, nontender, non-distended  MS: trace bliateral edema;  moves all extremities Integument: Skin feels warm Neuro:  At time of evaluation, alert and oriented to person/place/time/situation  Psych: Normal affect, patient feels well   ASSESSMENT AND PLAN: .    Rule out: Heart Failure with Preserved Ejection Fraction (HFpEF) Current assessment suggests HFpEF may be due to protein-calorie malnutrition and chronic steroid use rather than intrinsic heart failure. Volume overload likely related to hypoalbuminemia and chronic steroid use. Not labeling as HFpEF in current note. - Check labs (CMP, BNP) on 07/28/2023 (two days off prednisone taper) - Monitor albumin levels and kidney function - Reassess the need for Lasix based on lab results- if normal labs will trial PRN  Coronary Artery Calcification and Aortic Atherosclerosis - Incidental findings of coronary artery calcification and aortic atherosclerosis. Cholesterol levels well-controlled on rosuvastatin 10 mg. No current evidence of angina. - Continue rosuvastatin 10 mg daily  Hypertension - Labile blood pressure with episodes of both hypotension and hypertension. Current management includes Lasix 10 mg. Potential for switching Lasix to as  needed based on lab results. - Continue Lasix 10 mg daily - Reassess the need for Lasix after lab results on 07/28/2023  Premature Atrial Contractions (PACs) - History of PACs, not present on current EKG. Asymptomatic and not experiencing palpitations. Sending a one-week non-live heart monitor to evaluate need for metoprolol. - Send a one-week non-live heart monitor via mail - Evaluate heart monitor results to determine the need for metoprolol (may trail off)  Iron Deficiency  Anemia Iron deficiency anemia, previously treated with iron infusions. Monitoring hemoglobin and iron levels as needed. - as per PCP  Crohn's Disease Under the care of the Crohn's division at [name] and receiving infliximab (Remicade) infusions. High inflammation markers in the colon. Tapering off prednisone. - Continue infliximab infusions - Completing prednisone taper by 07/26/2023   Follow-up - Schedule follow-up visit with  my NP/PA teammate in late December or early January   Riley Lam, MD FASE [name] Cardiologist [name]  [address] [place], [address] [place], [place] [address] [phone]  12:53 PM

## 2023-07-05 ENCOUNTER — Other Ambulatory Visit: Payer: Self-pay | Admitting: Gastroenterology

## 2023-07-05 DIAGNOSIS — M7989 Other specified soft tissue disorders: Secondary | ICD-10-CM

## 2023-07-05 DIAGNOSIS — I2510 Atherosclerotic heart disease of native coronary artery without angina pectoris: Secondary | ICD-10-CM | POA: Diagnosis not present

## 2023-07-05 DIAGNOSIS — I491 Atrial premature depolarization: Secondary | ICD-10-CM | POA: Diagnosis not present

## 2023-07-07 ENCOUNTER — Telehealth: Payer: Self-pay | Admitting: Neurology

## 2023-07-07 NOTE — Telephone Encounter (Signed)
Pt said cognitive issues have gotten better and would like to cancel that appt

## 2023-07-29 LAB — COMPREHENSIVE METABOLIC PANEL
ALT: 27 [IU]/L (ref 0–32)
AST: 20 [IU]/L (ref 0–40)
Albumin: 3.5 g/dL — ABNORMAL LOW (ref 3.7–4.7)
Alkaline Phosphatase: 73 [IU]/L (ref 44–121)
BUN/Creatinine Ratio: 31 — ABNORMAL HIGH (ref 12–28)
BUN: 21 mg/dL (ref 8–27)
Bilirubin Total: <0.2 mg/dL (ref 0.0–1.2)
CO2: 25 mmol/L (ref 20–29)
Calcium: 9 mg/dL (ref 8.7–10.3)
Chloride: 101 mmol/L (ref 96–106)
Creatinine, Ser: 0.68 mg/dL (ref 0.57–1.00)
Globulin, Total: 2.6 g/dL (ref 1.5–4.5)
Glucose: 93 mg/dL (ref 70–99)
Potassium: 4.3 mmol/L (ref 3.5–5.2)
Sodium: 139 mmol/L (ref 134–144)
Total Protein: 6.1 g/dL (ref 6.0–8.5)
eGFR: 87 mL/min/{1.73_m2} (ref >59)

## 2023-07-29 LAB — PRO B NATRIURETIC PEPTIDE: NT-Pro BNP: 974 pg/mL — ABNORMAL HIGH (ref 0–738)

## 2023-08-01 ENCOUNTER — Telehealth: Payer: Self-pay

## 2023-08-01 DIAGNOSIS — I10 Essential (primary) hypertension: Secondary | ICD-10-CM

## 2023-08-01 NOTE — Telephone Encounter (Signed)
The patient has been notified of the result and verbalized understanding.  All questions (if any) were answered. Macie Burows, RN 08/01/2023 3:33 PM   BMP orders placed and released for draw at [facility].

## 2023-08-01 NOTE — Telephone Encounter (Signed)
-----   Message from Christell Constant sent at 07/30/2023  2:37 PM EST ----- Results: BNP elevated Plan: Increase to full pill lasix and BMP in 1-2 weeks  Christell Constant, MD

## 2023-08-07 ENCOUNTER — Encounter (HOSPITAL_COMMUNITY): Payer: Self-pay

## 2023-08-07 ENCOUNTER — Other Ambulatory Visit: Payer: Self-pay

## 2023-08-07 ENCOUNTER — Observation Stay (HOSPITAL_COMMUNITY)
Admission: EM | Admit: 2023-08-07 | Discharge: 2023-08-08 | Discharge status: Against Medical Advice | Payer: Medicare Other | Attending: Internal Medicine | Admitting: Internal Medicine

## 2023-08-07 DIAGNOSIS — D509 Iron deficiency anemia, unspecified: Secondary | ICD-10-CM | POA: Diagnosis not present

## 2023-08-07 DIAGNOSIS — I110 Hypertensive heart disease with heart failure: Secondary | ICD-10-CM | POA: Diagnosis not present

## 2023-08-07 DIAGNOSIS — I5032 Chronic diastolic (congestive) heart failure: Secondary | ICD-10-CM | POA: Insufficient documentation

## 2023-08-07 DIAGNOSIS — K529 Noninfective gastroenteritis and colitis, unspecified: Principal | ICD-10-CM | POA: Insufficient documentation

## 2023-08-07 DIAGNOSIS — K922 Gastrointestinal hemorrhage, unspecified: Secondary | ICD-10-CM | POA: Diagnosis present

## 2023-08-07 DIAGNOSIS — I10 Essential (primary) hypertension: Secondary | ICD-10-CM | POA: Diagnosis present

## 2023-08-07 DIAGNOSIS — K5010 Crohn's disease of large intestine without complications: Secondary | ICD-10-CM | POA: Diagnosis present

## 2023-08-07 DIAGNOSIS — Z66 Do not resuscitate: Secondary | ICD-10-CM | POA: Insufficient documentation

## 2023-08-07 DIAGNOSIS — K50119 Crohn's disease of large intestine with unspecified complications: Principal | ICD-10-CM | POA: Insufficient documentation

## 2023-08-07 DIAGNOSIS — M6281 Muscle weakness (generalized): Secondary | ICD-10-CM | POA: Diagnosis not present

## 2023-08-07 DIAGNOSIS — K219 Gastro-esophageal reflux disease without esophagitis: Secondary | ICD-10-CM | POA: Diagnosis present

## 2023-08-07 DIAGNOSIS — Z79899 Other long term (current) drug therapy: Secondary | ICD-10-CM | POA: Diagnosis not present

## 2023-08-07 DIAGNOSIS — K625 Hemorrhage of anus and rectum: Secondary | ICD-10-CM | POA: Insufficient documentation

## 2023-08-07 DIAGNOSIS — Z85528 Personal history of other malignant neoplasm of kidney: Secondary | ICD-10-CM | POA: Diagnosis not present

## 2023-08-07 DIAGNOSIS — E785 Hyperlipidemia, unspecified: Secondary | ICD-10-CM | POA: Diagnosis present

## 2023-08-07 DIAGNOSIS — E039 Hypothyroidism, unspecified: Secondary | ICD-10-CM | POA: Diagnosis present

## 2023-08-07 DIAGNOSIS — Z8616 Personal history of COVID-19: Secondary | ICD-10-CM | POA: Insufficient documentation

## 2023-08-07 DIAGNOSIS — F418 Other specified anxiety disorders: Secondary | ICD-10-CM | POA: Diagnosis present

## 2023-08-07 DIAGNOSIS — K921 Melena: Secondary | ICD-10-CM

## 2023-08-07 LAB — LIPASE, BLOOD: Lipase: 24 U/L (ref 11–51)

## 2023-08-07 LAB — COMPREHENSIVE METABOLIC PANEL
ALT: 19 U/L (ref 0–44)
AST: 20 U/L (ref 15–41)
Albumin: 2.7 g/dL — ABNORMAL LOW (ref 3.5–5.0)
Alkaline Phosphatase: 45 U/L (ref 38–126)
Anion gap: 8 (ref 5–15)
BUN: 21 mg/dL (ref 8–23)
CO2: 26 mmol/L (ref 22–32)
Calcium: 8.9 mg/dL (ref 8.9–10.3)
Chloride: 101 mmol/L (ref 98–111)
Creatinine, Ser: 0.64 mg/dL (ref 0.44–1.00)
GFR, Estimated: >60 mL/min (ref >60)
Glucose, Bld: 111 mg/dL — ABNORMAL HIGH (ref 70–99)
Potassium: 4 mmol/L (ref 3.5–5.1)
Sodium: 135 mmol/L (ref 135–145)
Total Bilirubin: 0.3 mg/dL (ref <1.2)
Total Protein: 6.5 g/dL (ref 6.5–8.1)

## 2023-08-07 LAB — CBC
HCT: 32.4 % — ABNORMAL LOW (ref 36.0–46.0)
Hemoglobin: 9.8 g/dL — ABNORMAL LOW (ref 12.0–15.0)
MCH: 28.3 pg (ref 26.0–34.0)
MCHC: 30.2 g/dL (ref 30.0–36.0)
MCV: 93.6 fL (ref 80.0–100.0)
Platelets: 458 10*3/uL — ABNORMAL HIGH (ref 150–400)
RBC: 3.46 MIL/uL — ABNORMAL LOW (ref 3.87–5.11)
RDW: 13.9 % (ref 11.5–15.5)
WBC: 10.5 10*3/uL (ref 4.0–10.5)
nRBC: 0 % (ref 0.0–0.2)

## 2023-08-07 NOTE — ED Triage Notes (Addendum)
Patient reports bloody stools, blood clots, and mucousy bloody discharge from rectum starting today. Patient reports swelling in legs and swollen abdomen lately as well, has been taking lasix as ordered. Patient has chills and night sweats for a week.

## 2023-08-08 ENCOUNTER — Telehealth: Payer: Self-pay | Admitting: Nurse Practitioner

## 2023-08-08 ENCOUNTER — Encounter (HOSPITAL_COMMUNITY): Payer: Self-pay | Admitting: Internal Medicine

## 2023-08-08 ENCOUNTER — Emergency Department (HOSPITAL_COMMUNITY): Payer: Medicare Other

## 2023-08-08 DIAGNOSIS — D500 Iron deficiency anemia secondary to blood loss (chronic): Secondary | ICD-10-CM

## 2023-08-08 DIAGNOSIS — Z8619 Personal history of other infectious and parasitic diseases: Secondary | ICD-10-CM | POA: Diagnosis not present

## 2023-08-08 DIAGNOSIS — Z85528 Personal history of other malignant neoplasm of kidney: Secondary | ICD-10-CM

## 2023-08-08 DIAGNOSIS — K5010 Crohn's disease of large intestine without complications: Secondary | ICD-10-CM

## 2023-08-08 DIAGNOSIS — K50111 Crohn's disease of large intestine with rectal bleeding: Secondary | ICD-10-CM

## 2023-08-08 DIAGNOSIS — K922 Gastrointestinal hemorrhage, unspecified: Secondary | ICD-10-CM | POA: Diagnosis present

## 2023-08-08 DIAGNOSIS — E785 Hyperlipidemia, unspecified: Secondary | ICD-10-CM | POA: Diagnosis present

## 2023-08-08 DIAGNOSIS — Z8719 Personal history of other diseases of the digestive system: Secondary | ICD-10-CM | POA: Diagnosis not present

## 2023-08-08 DIAGNOSIS — Z905 Acquired absence of kidney: Secondary | ICD-10-CM

## 2023-08-08 DIAGNOSIS — Z66 Do not resuscitate: Secondary | ICD-10-CM | POA: Diagnosis present

## 2023-08-08 LAB — CBC WITH DIFFERENTIAL/PLATELET
Abs Immature Granulocytes: 0.02 10*3/uL (ref 0.00–0.07)
Basophils Absolute: 0 10*3/uL (ref 0.0–0.1)
Basophils Relative: 0 %
Eosinophils Absolute: 0.1 10*3/uL (ref 0.0–0.5)
Eosinophils Relative: 2 %
HCT: 29.9 % — ABNORMAL LOW (ref 36.0–46.0)
Hemoglobin: 9.3 g/dL — ABNORMAL LOW (ref 12.0–15.0)
Immature Granulocytes: 0 %
Lymphocytes Relative: 45 %
Lymphs Abs: 3.2 10*3/uL (ref 0.7–4.0)
MCH: 28.4 pg (ref 26.0–34.0)
MCHC: 31.1 g/dL (ref 30.0–36.0)
MCV: 91.4 fL (ref 80.0–100.0)
Monocytes Absolute: 0.4 10*3/uL (ref 0.1–1.0)
Monocytes Relative: 6 %
Neutro Abs: 3.4 10*3/uL (ref 1.7–7.7)
Neutrophils Relative %: 47 %
Platelets: 398 10*3/uL (ref 150–400)
RBC: 3.27 MIL/uL — ABNORMAL LOW (ref 3.87–5.11)
RDW: 14 % (ref 11.5–15.5)
WBC: 7.2 10*3/uL (ref 4.0–10.5)
nRBC: 0 % (ref 0.0–0.2)

## 2023-08-08 LAB — URINALYSIS, ROUTINE W REFLEX MICROSCOPIC
Bilirubin Urine: NEGATIVE
Glucose, UA: NEGATIVE mg/dL
Ketones, ur: NEGATIVE mg/dL
Nitrite: NEGATIVE
Protein, ur: NEGATIVE mg/dL
RBC / HPF: >50 RBC/hpf (ref 0–5)
Specific Gravity, Urine: 1.004 — ABNORMAL LOW (ref 1.005–1.030)
pH: 8 (ref 5.0–8.0)

## 2023-08-08 LAB — BASIC METABOLIC PANEL
Anion gap: 7 (ref 5–15)
BUN: 11 mg/dL (ref 8–23)
CO2: 24 mmol/L (ref 22–32)
Calcium: 8.5 mg/dL — ABNORMAL LOW (ref 8.9–10.3)
Chloride: 106 mmol/L (ref 98–111)
Creatinine, Ser: 0.54 mg/dL (ref 0.44–1.00)
GFR, Estimated: >60 mL/min (ref >60)
Glucose, Bld: 98 mg/dL (ref 70–99)
Potassium: 3.5 mmol/L (ref 3.5–5.1)
Sodium: 137 mmol/L (ref 135–145)

## 2023-08-08 LAB — C DIFFICILE QUICK SCREEN W PCR REFLEX
C Diff antigen: NEGATIVE
C Diff interpretation: NOT DETECTED
C Diff toxin: NEGATIVE

## 2023-08-08 LAB — POC OCCULT BLOOD, ED
Fecal Occult Bld: POSITIVE — AB
Fecal Occult Bld: POSITIVE — AB

## 2023-08-08 LAB — BRAIN NATRIURETIC PEPTIDE: B Natriuretic Peptide: 98.5 pg/mL (ref 0.0–100.0)

## 2023-08-08 MED ORDER — MESALAMINE 1.2 G PO TBEC: 2.4000 g | DELAYED_RELEASE_TABLET | Freq: Every day | ORAL | Status: DC

## 2023-08-08 MED ORDER — HYDRALAZINE HCL 20 MG/ML IJ SOLN: 5.0000 mg | INTRAMUSCULAR | Status: DC | PRN

## 2023-08-08 MED ORDER — LACTATED RINGERS IV SOLN: INTRAVENOUS | Status: DC

## 2023-08-08 MED ORDER — PANTOPRAZOLE SODIUM 40 MG IV SOLR
40.0000 mg | Freq: Once | INTRAVENOUS | Status: AC
  Administered 2023-08-08: 40 mg via INTRAVENOUS
  Filled 2023-08-08: qty 10

## 2023-08-08 MED ORDER — LEVOTHYROXINE SODIUM 88 MCG PO TABS: 88.0000 ug | ORAL_TABLET | Freq: Every morning | ORAL | Status: DC

## 2023-08-08 MED ORDER — ALPRAZOLAM 0.25 MG PO TABS: 0.5000 mg | ORAL_TABLET | Freq: Every day | ORAL | Status: DC

## 2023-08-08 MED ORDER — ROSUVASTATIN CALCIUM 5 MG PO TABS: 10.0000 mg | ORAL_TABLET | Freq: Every evening | ORAL | Status: DC

## 2023-08-08 MED ORDER — ACETAMINOPHEN 500 MG PO TABS
1000.0000 mg | ORAL_TABLET | Freq: Once | ORAL | Status: AC
  Administered 2023-08-08: 1000 mg via ORAL
  Filled 2023-08-08: qty 2

## 2023-08-08 MED ORDER — FAMOTIDINE 20 MG PO TABS
20.0000 mg | ORAL_TABLET | Freq: Every day | ORAL | Status: DC
  Administered 2023-08-08: 20 mg via ORAL
  Filled 2023-08-08: qty 1

## 2023-08-08 MED ORDER — ACETAMINOPHEN 325 MG PO TABS
650.0000 mg | ORAL_TABLET | Freq: Four times a day (QID) | ORAL | Status: DC | PRN
  Administered 2023-08-08: 650 mg via ORAL
  Filled 2023-08-08: qty 2

## 2023-08-08 MED ORDER — IOHEXOL 350 MG/ML SOLN
80.0000 mL | Freq: Once | INTRAVENOUS | Status: AC | PRN
  Administered 2023-08-08: 80 mL via INTRAVENOUS

## 2023-08-08 MED ORDER — ONDANSETRON HCL 4 MG PO TABS: 4.0000 mg | ORAL_TABLET | Freq: Four times a day (QID) | ORAL | Status: DC | PRN

## 2023-08-08 MED ORDER — METOPROLOL SUCCINATE ER 25 MG PO TB24
12.5000 mg | ORAL_TABLET | Freq: Every day | ORAL | Status: DC
  Administered 2023-08-08: 12.5 mg via ORAL
  Filled 2023-08-08: qty 1

## 2023-08-08 MED ORDER — DULOXETINE HCL 30 MG PO CPEP
60.0000 mg | ORAL_CAPSULE | Freq: Every day | ORAL | Status: DC
  Administered 2023-08-08: 60 mg via ORAL
  Filled 2023-08-08: qty 2

## 2023-08-08 MED ORDER — ACETAMINOPHEN 650 MG RE SUPP: 650.0000 mg | Freq: Four times a day (QID) | RECTAL | Status: DC | PRN

## 2023-08-08 MED ORDER — ONDANSETRON HCL 4 MG/2ML IJ SOLN: 4.0000 mg | Freq: Four times a day (QID) | INTRAMUSCULAR | Status: DC | PRN

## 2023-08-08 MED ORDER — METHYLPREDNISOLONE SODIUM SUCC 40 MG IJ SOLR
20.0000 mg | Freq: Three times a day (TID) | INTRAMUSCULAR | Status: DC
  Administered 2023-08-08: 20 mg via INTRAVENOUS
  Filled 2023-08-08: qty 1

## 2023-08-08 NOTE — Telephone Encounter (Signed)
Inbound call from patient stating that she wanted me to send a message to the NP she seen in the hospital. She states  she is not going to be admitted to the hospital because she does not think she has C- Diff because she is not having diarrhea. Patient stated she just did a stool test on Monday for Dr. Juanita Craver and does not want to be in the hospital over the weekend.

## 2023-08-08 NOTE — H&P (Signed)
History and Physical    Patient: [name] ZOX:[id number] DOB: 1941-03-15 DOA: 08/07/2023 DOS: the patient was seen and examined on 08/08/2023 PCP: Adrian Prince, MD  Patient coming from: Home - lives in [place]; [place]: Emmie Niemann [place], [phone]   Chief Complaint: Rectal bleeding  HPI: [name] is a 82 y.o. female with medical history significant of Crohn's disease, RCC s/p partial nephrectomy, hypothyroidism, HTN, and HLD presenting with rectal bleeding.  She reports h/o Crohn's and she noticed over the last few days no diarrhea but she was having mucus and blood with small clots.  Her stomach was sore, epigastric region.  Blood in stools has been red streaks.  +subjective fever and chills at night.  She has been having night sweats.  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  She has been seen for this at [facility] and has had 3 infusions of influximab.  Her current symptoms are not c/w usual flares - previously with diarrhea flares and she doesn't usually have the abdominal pain.    ER Course:  Rectal bleeding in the setting of Crohn's (managed at [facility]).  Heme positive.  NPO.  [facility] (Dr. Leonides Schanz) will see.  Started on Solumedrol, stool studies pending.  NPO currently.     Review of Systems: As mentioned in the history of present illness. All other systems reviewed and are negative. Past Medical History:  Diagnosis Date   C. difficile colitis    Cancer (HCC) 2014   kidney   COVID    Crohn's disease (HCC) 04/17/2023   Esophageal stricture    Fibromyalgia    GERD (gastroesophageal reflux disease)    Helicobacter pylori gastritis    Hypertension    Intention tremor    Lumbago    Memory loss    Osteoarthritis of spine    Pneumonia    hx of years ago    PUD (peptic ulcer disease)    PVD (peripheral vascular disease) (HCC)     Past Surgical History:  Procedure Laterality Date   ABDOMINAL HYSTERECTOMY  1985   BIOPSY  08/30/2022   Procedure: BIOPSY;  Surgeon: Benancio Deeds, MD;  Location: [facility] ENDOSCOPY;  Service: Gastroenterology;;   BREAST ENHANCEMENT SURGERY  1974   CATARACT EXTRACTION Bilateral 2014   shapiro   COLONOSCOPY WITH PROPOFOL N/A 08/30/2022   Procedure: COLONOSCOPY WITH PROPOFOL;  Surgeon: Benancio Deeds, MD;  Location: [facility] ENDOSCOPY;  Service: Gastroenterology;  Laterality: N/A;   LAPAROSCOPIC PARTIAL NEPHRECTOMY Left April '13   RCC, Dr. Laverle Patter   LAPAROTOMY N/A 07/05/2022   Procedure: EXPLORATORY LAPAROTOMY;  Surgeon: Andria Meuse, MD;  Location: [facility] OR;  Service: General;  Laterality: N/A;   LUMBAR FUSION  03/2003   L4-5   TONSILLECTOMY  1958   TUBAL LIGATION  1984   Social History:  reports that she has never smoked. She has never used smokeless tobacco. She reports that she does not currently use alcohol after a past usage of about 7.0 standard drinks of alcohol per week. She reports that she does not use drugs.  Allergies  Allergen Reactions   Morphine And Codeine Itching and Rash    Given in IV.    Family History  Problem Relation Age of Onset   Heart disease Mother    COPD Mother    Thyroid disease Mother  Diabetes Father    Heart disease Father        CABG   Stroke Father    Parkinson's disease Father    Heart disease Brother    Stroke Brother    Hyperlipidemia Brother    Diabetes Daughter    Hearing loss Brother    Crohn's disease Other        neice   Hypertension Son    Diabetes Son    Melanoma Son    Colon cancer Neg Hx    Stomach cancer Neg Hx    Esophageal cancer Neg Hx     Prior to Admission medications   Medication Sig Start Date End Date Taking? Authorizing Provider  acetaminophen (TYLENOL) 325 MG tablet Take 650 mg by mouth 2 (two) times daily as needed for mild pain or headache.    [provider]  ALPRAZolam [name]) 0.5 MG  tablet Take 1 tablet (0.5 mg total) by mouth 2 (two) times daily as needed. for anxiety Patient taking differently: Take 0.5 mg by mouth at bedtime. for anxiety 07/16/22   Marinda Elk, MD  Carboxymethylcellulose Sodium (EYE DROPS OP) Place 2 drops into both eyes in the morning and at bedtime.    [provider]  Cyanocobalamin (VITAMIN B-12 CR PO) Take by mouth daily.    [provider]  DULoxetine (CYMBALTA) 60 MG capsule Take 60 mg by mouth daily.    [provider]  famotidine (PEPCID) 20 MG tablet Take 1 tablet (20 mg total) by mouth daily. 05/13/23   Armbruster, Willaim Rayas, MD  furosemide (LASIX) 20 MG tablet Take 1 tablet (20 mg total) by mouth daily. Patient taking differently: 20 mg. Take 1/2 tablet by mouth daily 12/25/22 06/25/23  Custovic, Rozell Searing, DO  iron polysaccharides (NIFEREX) 150 MG capsule Take 150 mg by mouth 2 (two) times daily. 12/12/22   [provider]  levothyroxine (SYNTHROID) 88 MCG tablet Take 88 mcg by mouth every morning. 09/13/22   [provider]  mesalamine (LIALDA) 1.2 g EC tablet Take 2 tablets (2.4 g total) by mouth in the morning and at bedtime. 11/22/22   Armbruster, Willaim Rayas, MD  metoprolol succinate (TOPROL-XL) 25 MG 24 hr tablet Take 12.5 mg by mouth daily.    [provider]  Multiple Vitamins-Iron (MULTIVITAMIN/IRON PO) Take 1 tablet by mouth 2 (two) times daily.    [provider]  ondansetron (ZOFRAN-ODT) 4 MG disintegrating tablet Take 1 tablet (4 mg total) by mouth every 8 (eight) hours as needed for nausea or vomiting. Please keep your Sept appointment for further refills. Thanks. 04/10/23   Armbruster, Willaim Rayas, MD  predniSONE (DELTASONE) 20 MG tablet  04/30/23   [provider]  rosuvastatin (CRESTOR) 10 MG tablet Take 10 mg by mouth every evening.    [provider]  VITAMIN D PO Take by mouth daily.    [provider]    Physical Exam: Vitals:   08/08/23 0108  08/08/23 0300 08/08/23 0337 08/08/23 1000  BP: (!) 111/48 113/64 (!) 117/55 113/61  Pulse: 69 72 70 65  Resp: (!) 21 17 18 10   Temp:   97.9 F (36.6 C) 97.6 F (36.4 C)  TempSrc:   Oral Oral  SpO2: 97% 100% 100% 97%  Weight:      Height:       General:  Appears calm and comfortable and is in NAD Eyes:  EOMI, normal lids, iris ENT:  grossly normal hearing, lips & tongue,  mmm; appropriate dentition Neck:  no LAD, masses or thyromegaly Cardiovascular:  RRR, no m/r/g. No LE edema.  Respiratory:   CTA bilaterally with no wheezes/rales/rhonchi.  Normal respiratory effort. Abdomen:  soft, diffusely TTP but mostly in midepigastric region, ND Skin:  no rash or induration seen on limited exam Musculoskeletal:  grossly normal tone BUE/BLE, good ROM, no bony abnormality Psychiatric:  grossly normal mood and affect, speech fluent and appropriate, AOx3 Neurologic:  CN 2-12 grossly intact, moves all extremities in coordinated fashion   Radiological Exams on Admission: Independently reviewed - see discussion in A/P where applicable  CT ANGIO GI BLEED Result Date: 08/08/2023 CLINICAL DATA:  82 year old female with melena and passing blood clots for 2 days. Mucousy, bloody discharge from rectum. Inflammatory bowel disease. EXAM: CTA ABDOMEN AND PELVIS WITHOUT AND WITH CONTRAST TECHNIQUE: Multidetector CT imaging of the abdomen and pelvis was performed using the standard protocol during bolus administration of intravenous contrast. Multiplanar reconstructed images and MIPs were obtained and reviewed to evaluate the vascular anatomy. RADIATION DOSE REDUCTION: This exam was performed according to the departmental dose-optimization program which includes automated exposure control, adjustment of the mA and/or kV according to patient size and/or use of iterative reconstruction technique. CONTRAST:  80mL OMNIPAQUE IOHEXOL 350 MG/ML SOLN COMPARISON:  CT Abdomen and Pelvis 10/20/2022. FINDINGS: VASCULAR Splenic  flexure, descending colon mucosal and mesenteric hyperenhancement. But across these multiphase images no contrast extravasation into the bowel lumen is identified. Mild for age Aortoiliac calcified atherosclerosis. Normal caliber abdominal aorta. Aortic branches including the celiac, SMA, IMA appear patent with little atherosclerosis. Similar mild iliofemoral atherosclerosis. Major arterial branches are patent. Portal venous system appears patent. Review of the MIP images confirms the above findings. NON-VASCULAR Lower chest: Pleural effusions in March have resolved. Cardiomegaly appears stable. No pericardial effusion. Mild lung base atelectasis, scarring. Hepatobiliary: Negative. Pancreas: Negative. Spleen: Negative. Adrenals/Urinary Tract: Normal adrenal glands. Kidneys appears stable and within normal limits. Similar mildly to moderately distended urinary bladder, otherwise unremarkable. No urinary calculus identified. Stomach/Bowel: Moderate to severe wall thickening, inflammation of the large bowel at the splenic flexure and in the proximal descending colon (series 8, image 30) with pronounced circumferential wall thickening up to 14 mm in the affected segments, and fairly long segment of involvement, approximately 20-25 cm of the colon. Mucosal hyperenhancement in those areas. The upstream transverse colon and downstream sigmoid colon appear more normal with retained stool. And moderate volume of retained stool is increased compared to the March CT. Normal retrocecal appendix. No dilated small bowel. Decompressed stomach and duodenum. No pneumoperitoneum. Mesenteric edema along the left gutter, but no drainable fluid or fluid collection. Lymphatic: No lymphadenopathy identified. Reproductive: Surgically absent uterus. Small volume of fluid in the vaginal fornix, significance unclear (series 8, image 66). Normal left ovary at the left pelvic inlet series 8, image 55. Probable diminutive right ovary on image  56. Other: No pelvis free fluid. Musculoskeletal: Chronic lumbar spine degeneration and previous lower lumbar fusion. No acute osseous abnormality identified. IMPRESSION: 1. Positive for Colitis with moderate to severe inflammation over a fairly long 20-25 cm segment encompassing the splenic flexure and most of the descending colon. Bulky circumferential mural thickening and pronounced mesenteric inflammation, mucosal hyperenhancement. But no CTA evidence of active GI bleeding. 2. No free air, abscess, drainable fluid. No bowel obstruction or other complicating features at this time. 3. Otherwise resolved pleural effusions and increased volume of retained stool since March. 4. Mild for age Aortic Atherosclerosis (ICD10-I70.0). Electronically Signed  By: Odessa Fleming M.D.   On: 08/08/2023 04:20    EKG: Independently reviewed.  NSR with rate 78; nonspecific ST changes with no evidence of acute ischemia   Labs on Admission: I have personally reviewed the available labs and imaging studies at the time of the admission.  Pertinent labs:    Unremarkable BMP WBC 7.2 Hgb 9.3, down from 9.8 UA: large Hgb, large LE, many bacteria Heme positive   Assessment and Plan: Principal Problem:   Crohn's colitis (HCC) Active Problems:   Essential hypertension   Hypothyroidism   GERD (gastroesophageal reflux disease)   Anxiety with depression   Acute lower GI bleeding   Dyslipidemia   DNR (do not resuscitate)    Crohn's flare Patient with h/o Crohn's Symptoms are consistent with Crohn's flare She does c/o epigastric pain, which is somewhat atypical No fever or  leukocytosis CT with moderate to severe wall thickening and inflammation with mucosal hyperenhancement Low suspicion for infectious etiology so will not start antibiotics at this time (also with h/o recurrent C diff, so avoid unless truly needed) Will give gentle IVF hydration and patient should be strict NPO to allow bowel rest Pain control with  Tylenol for now Continue famotidine, mesalamine Solumedrol 20 mg IV q8 hours Heme positive but CTA negative for active bleeding GI is consulting   HTN Continue Toprol XL  HLD Continue rosuvastatin  Chronic diastolic CHF Hold furosemide for now  Hypothyroidism Continue Synthroid  RCC  s/p partial nephrectomy Normal renal function  Anxiety Continue alprazolam, duloxetine  Muscular deconditioning After her recurrent flares of C diff, she has been working hard on regaining strength She is still using a Rollator when outside of the home and is hoping to be off this by spring She wants to make sure she doesn't get excessively weak again PT/OT consults requested  DNR Confirmed with patient and with [employer] documents     Advance Care Planning:   Code Status: Limited: Do not attempt resuscitation (DNR) -DNR-LIMITED -Do Not Intubate/DNI    Consults: GI; PT/OT  DVT Prophylaxis: SCDs  Family Communication: None present; she was on the telephone with her son when I entered the room  Severity of Illness: The appropriate patient status for this patient is OBSERVATION. Observation status is judged to be reasonable and necessary in order to provide the required intensity of service to ensure the patient's safety. The patient's presenting symptoms, physical exam findings, and initial radiographic and laboratory data in the context of their medical condition is felt to place them at decreased risk for further clinical deterioration. Furthermore, it is anticipated that the patient will be medically stable for discharge from the hospital within 2 midnights of admission.   Author: Jonah Blue, MD 08/08/2023 10:08 AM  For on call review [email or web address].

## 2023-08-08 NOTE — ED Notes (Signed)
ED TO INPATIENT HANDOFF REPORT  ED Nurse Name and Phone #: Rodney Booze [phone]  S Name/Age/Gender [name] 82 y.o. female Room/Bed: 010C/010C  Code Status   Code Status: Limited: Do not attempt resuscitation (DNR) -DNR-LIMITED -Do Not Intubate/DNI   Home/SNF/Other Home Patient oriented to: self, place, time, and situation Is this baseline? Yes   Triage Complete: Triage complete  Chief Complaint Acute lower GI bleeding [K92.2]  Triage Note Patient reports bloody stools, blood clots, and mucousy bloody discharge from rectum starting today. Patient reports swelling in legs and swollen abdomen lately as well, has been taking lasix as ordered. Patient has chills and night sweats for a week.   Allergies Allergies  Allergen Reactions   Morphine And Codeine Itching and Rash    Given in IV.    Level of Care/Admitting Diagnosis ED Disposition     ED Disposition  Admit   Condition  --   Comment  Hospital Area: [facility] [[id number]]  Level of Care: Med-Surg [16]  May place patient in observation at [facility] or [facility] if equivalent level of care is available:: Yes  Covid Evaluation: Asymptomatic - no recent exposure (last 10 days) testing not required  Diagnosis: Acute lower GI bleeding [098119]  Admitting Physician: Jonah Blue [[id number]]  Attending Physician: Jonah Blue [[id number]]          B Medical/Surgery History Past Medical History:  Diagnosis Date   C. difficile colitis    Cancer (HCC) 2014   kidney   COVID    Crohn's disease (HCC) 04/17/2023   Esophageal stricture    Fibromyalgia    GERD (gastroesophageal reflux disease)    Helicobacter pylori gastritis    Hypertension    Intention tremor    Lumbago    Memory loss    Osteoarthritis of spine    Pneumonia    hx of years ago    PUD (peptic ulcer disease)    PVD (peripheral vascular disease) (HCC)    Past Surgical History:  Procedure Laterality Date   ABDOMINAL  HYSTERECTOMY  1985   BIOPSY  08/30/2022   Procedure: BIOPSY;  Surgeon: Benancio Deeds, MD;  Location: [facility] ENDOSCOPY;  Service: Gastroenterology;;   BREAST ENHANCEMENT SURGERY  1974   CATARACT EXTRACTION Bilateral 2014   shapiro   COLONOSCOPY WITH PROPOFOL N/A 08/30/2022   Procedure: COLONOSCOPY WITH PROPOFOL;  Surgeon: Benancio Deeds, MD;  Location: [facility] ENDOSCOPY;  Service: Gastroenterology;  Laterality: N/A;   LAPAROSCOPIC PARTIAL NEPHRECTOMY Left April '13   [facility], Dr. Laverle Patter   LAPAROTOMY N/A 07/05/2022   Procedure: EXPLORATORY LAPAROTOMY;  Surgeon: Andria Meuse, MD;  Location: [facility] OR;  Service: General;  Laterality: N/A;   LUMBAR FUSION  03/2003   L4-5   TONSILLECTOMY  1958   TUBAL LIGATION  1984     A IV Location/Drains/Wounds Patient Lines/Drains/Airways Status     Active Line/Drains/Airways     Name Placement date Placement time Site Days   Peripheral IV 08/08/23 20 G Right Antecubital 08/08/23  0225  Antecubital  less than 1            Intake/Output Last 24 hours No intake or output data in the 24 hours ending 08/08/23 1450  Labs/Imaging Results for orders placed or performed during the hospital encounter of 08/07/23 (from the past 48 hours)  Lipase, blood     Status: None   Collection Time: 08/07/23  2:53 PM  Result Value Ref Range   Lipase  24 11 - 51 U/L    Comment: Performed at [facility], [address], [place], [place] [address]  Comprehensive metabolic panel     Status: Abnormal   Collection Time: 08/07/23  2:53 PM  Result Value Ref Range   Sodium 135 135 - 145 mmol/L   Potassium 4.0 3.5 - 5.1 mmol/L   Chloride 101 98 - 111 mmol/L   CO2 26 22 - 32 mmol/L   Glucose, Bld 111 (H) 70 - 99 mg/dL    Comment: Glucose reference range applies only to samples taken after fasting for at least 8 hours.   BUN 21 8 - 23 mg/dL   Creatinine, Ser 1.47 0.44 - 1.00 mg/dL   Calcium 8.9 8.9 - 82.9 mg/dL   Total Protein 6.5 6.5 - 8.1 g/dL   Albumin  2.7 (L) 3.5 - 5.0 g/dL   AST 20 15 - 41 U/L   ALT 19 0 - 44 U/L   Alkaline Phosphatase 45 38 - 126 U/L   Total Bilirubin 0.3 <1.2 mg/dL   GFR, Estimated >56 >21 mL/min    Comment: (NOTE) Calculated using the CKD-EPI Creatinine Equation (2021)    Anion gap 8 5 - 15    Comment: Performed at [facility], [address], [place], [place] [address]  CBC     Status: Abnormal   Collection Time: 08/07/23  2:53 PM  Result Value Ref Range   WBC 10.5 4.0 - 10.5 K/uL   RBC 3.46 (L) 3.87 - 5.11 MIL/uL   Hemoglobin 9.8 (L) 12.0 - 15.0 g/dL   HCT 78.4 (L) 69.6 - 29.5 %   MCV 93.6 80.0 - 100.0 fL   MCH 28.3 26.0 - 34.0 pg   MCHC 30.2 30.0 - 36.0 g/dL   RDW 28.4 13.2 - 44.0 %   Platelets 458 (H) 150 - 400 K/uL   nRBC 0.0 0.0 - 0.2 %    Comment: Performed at [facility], [address], [place], [place] [address]  POC occult blood, ED     Status: Abnormal   Collection Time: 08/08/23  1:36 AM  Result Value Ref Range   Fecal Occult Bld POSITIVE (A) NEGATIVE  Brain natriuretic peptide     Status: None   Collection Time: 08/08/23  1:52 AM  Result Value Ref Range   B Natriuretic Peptide 98.5 0.0 - 100.0 pg/mL    Comment: Performed at [facility], [address] [place], [place] [address]  Urinalysis, Routine w reflex microscopic -Urine, Clean Catch     Status: Abnormal   Collection Time: 08/08/23  6:37 AM  Result Value Ref Range   Color, Urine YELLOW YELLOW   APPearance CLOUDY (A) CLEAR   Specific Gravity, Urine 1.004 (L) 1.005 - 1.030   pH 8.0 5.0 - 8.0   Glucose, UA NEGATIVE NEGATIVE mg/dL   Hgb urine dipstick LARGE (A) NEGATIVE   Bilirubin Urine NEGATIVE NEGATIVE   Ketones, ur NEGATIVE NEGATIVE mg/dL   Protein, ur NEGATIVE NEGATIVE mg/dL   Nitrite NEGATIVE NEGATIVE   Leukocytes,Ua LARGE (A) NEGATIVE   RBC / HPF >50 0 - 5 RBC/hpf   WBC, UA 21-50 0 - 5 WBC/hpf   Bacteria, UA MANY (A) NONE SEEN   Squamous Epithelial / HPF 0-5 0 - 5 /HPF   Non Squamous Epithelial 0-5  (A) NONE SEEN    Comment: Performed at [facility], [address] [place], [place] [address]  POC occult blood, ED     Status: Abnormal   Collection Time: 08/08/23  6:41 AM  Result Value Ref Range   Fecal Occult Bld POSITIVE (A) NEGATIVE  Basic metabolic panel     Status: Abnormal   Collection Time: 08/08/23  8:02 AM  Result Value Ref Range   Sodium 137 135 - 145 mmol/L   Potassium 3.5 3.5 - 5.1 mmol/L   Chloride 106 98 - 111 mmol/L   CO2 24 22 - 32 mmol/L   Glucose, Bld 98 70 - 99 mg/dL    Comment: Glucose reference range applies only to samples taken after fasting for at least 8 hours.   BUN 11 8 - 23 mg/dL   Creatinine, Ser 1.61 0.44 - 1.00 mg/dL   Calcium 8.5 (L) 8.9 - 10.3 mg/dL   GFR, Estimated >09 >60 mL/min    Comment: (NOTE) Calculated using the CKD-EPI Creatinine Equation (2021)    Anion gap 7 5 - 15    Comment: Performed at [facility], [address] [place], [place] [address]  CBC with Differential/Platelet     Status: Abnormal   Collection Time: 08/08/23  8:02 AM  Result Value Ref Range   WBC 7.2 4.0 - 10.5 K/uL   RBC 3.27 (L) 3.87 - 5.11 MIL/uL   Hemoglobin 9.3 (L) 12.0 - 15.0 g/dL   HCT 81.1 (L) 91.4 - 78.2 %   MCV 91.4 80.0 - 100.0 fL   MCH 28.4 26.0 - 34.0 pg   MCHC 31.1 30.0 - 36.0 g/dL   RDW 95.6 21.3 - 08.6 %   Platelets 398 150 - 400 K/uL   nRBC 0.0 0.0 - 0.2 %   Neutrophils Relative % 47 %   Neutro Abs 3.4 1.7 - 7.7 K/uL   Lymphocytes Relative 45 %   Lymphs Abs 3.2 0.7 - 4.0 K/uL   Monocytes Relative 6 %   Monocytes Absolute 0.4 0.1 - 1.0 K/uL   Eosinophils Relative 2 %   Eosinophils Absolute 0.1 0.0 - 0.5 K/uL   Basophils Relative 0 %   Basophils Absolute 0.0 0.0 - 0.1 K/uL   Immature Granulocytes 0 %   Abs Immature Granulocytes 0.02 0.00 - 0.07 K/uL    Comment: Performed at [facility], [address] [place], [place] [address]   CT ANGIO GI BLEED Result Date: 08/08/2023 CLINICAL DATA:  82 year old female with  melena and passing blood clots for 2 days. Mucousy, bloody discharge from rectum. Inflammatory bowel disease. EXAM: CTA ABDOMEN AND PELVIS WITHOUT AND WITH CONTRAST TECHNIQUE: Multidetector CT imaging of the abdomen and pelvis was performed using the standard protocol during bolus administration of intravenous contrast. Multiplanar reconstructed images and MIPs were obtained and reviewed to evaluate the vascular anatomy. RADIATION DOSE REDUCTION: This exam was performed according to the departmental dose-optimization program which includes automated exposure control, adjustment of the mA and/or kV according to patient size and/or use of iterative reconstruction technique. CONTRAST:  80mL OMNIPAQUE IOHEXOL 350 MG/ML SOLN COMPARISON:  CT Abdomen and Pelvis 10/20/2022. FINDINGS: VASCULAR Splenic flexure, descending colon mucosal and mesenteric hyperenhancement. But across these multiphase images no contrast extravasation into the bowel lumen is identified. Mild for age Aortoiliac calcified atherosclerosis. Normal caliber abdominal aorta. Aortic branches including the celiac, SMA, IMA appear patent with little atherosclerosis. Similar mild iliofemoral atherosclerosis. Major arterial branches are patent. Portal venous system appears patent. Review of the MIP images confirms the above findings. NON-VASCULAR Lower chest: Pleural effusions in  March have resolved. Cardiomegaly appears stable. No pericardial effusion. Mild lung base atelectasis, scarring. Hepatobiliary: Negative. Pancreas: Negative. Spleen: Negative. Adrenals/Urinary Tract: Normal adrenal glands. Kidneys appears stable and within normal limits. Similar mildly to moderately distended urinary bladder, otherwise unremarkable. No urinary calculus identified. Stomach/Bowel: Moderate to severe wall thickening, inflammation of the large bowel at the splenic flexure and in the proximal descending colon (series 8, image 30) with pronounced circumferential wall  thickening up to 14 mm in the affected segments, and fairly long segment of involvement, approximately 20-25 cm of the colon. Mucosal hyperenhancement in those areas. The upstream transverse colon and downstream sigmoid colon appear more normal with retained stool. And moderate volume of retained stool is increased compared to the March CT. Normal retrocecal appendix. No dilated small bowel. Decompressed stomach and duodenum. No pneumoperitoneum. Mesenteric edema along the left gutter, but no drainable fluid or fluid collection. Lymphatic: No lymphadenopathy identified. Reproductive: Surgically absent uterus. Small volume of fluid in the vaginal fornix, significance unclear (series 8, image 66). Normal left ovary at the left pelvic inlet series 8, image 55. Probable diminutive right ovary on image 56. Other: No pelvis free fluid. Musculoskeletal: Chronic lumbar spine degeneration and previous lower lumbar fusion. No acute osseous abnormality identified. IMPRESSION: 1. Positive for Colitis with moderate to severe inflammation over a fairly long 20-25 cm segment encompassing the splenic flexure and most of the descending colon. Bulky circumferential mural thickening and pronounced mesenteric inflammation, mucosal hyperenhancement. But no CTA evidence of active GI bleeding. 2. No free air, abscess, drainable fluid. No bowel obstruction or other complicating features at this time. 3. Otherwise resolved pleural effusions and increased volume of retained stool since March. 4. Mild for age Aortic Atherosclerosis (ICD10-I70.0). Electronically Signed   By: Odessa Fleming M.D.   On: 08/08/2023 04:20    Pending Labs Unresulted Labs (From admission, onward)     Start     Ordered   08/09/23 0500  Basic metabolic panel  Tomorrow morning,   R        08/08/23 1006   08/09/23 0500  CBC  Tomorrow morning,   R        08/08/23 1006   08/08/23 0626  Gastrointestinal Panel by PCR , Stool  (Gastrointestinal Panel by PCR, Stool                                                                                                                                                      **Does Not include CLOSTRIDIUM DIFFICILE testing. **If CDIFF testing is needed, place order from the "C Difficile Testing" order set.**)  Once,   URGENT        08/08/23 0626   08/08/23 0626  C Difficile Quick Screen w PCR reflex  (C Difficile quick screen w PCR reflex panel )  Once, for  24 hours,   URGENT       References:    CDiff Information Tool   08/08/23 0626            Vitals/Pain Today's Vitals   08/08/23 1000 08/08/23 1100 08/08/23 1330 08/08/23 1334  BP: 113/61  (!) 109/53   Pulse: 65  74   Resp: 10  16   Temp: 97.6 F (36.4 C)   (!) 97.5 F (36.4 C)  TempSrc: Oral   Oral  SpO2: 97%  98%   Weight:      Height:      PainSc:  9       Isolation Precautions Enteric precautions (UV disinfection)  Medications Medications  methylPREDNISolone sodium succinate (SOLU-MEDROL) 40 mg/mL injection 20 mg (20 mg Intravenous Not Given 08/08/23 1329)  lactated ringers infusion ( Intravenous New Bag/Given 08/08/23 1109)  acetaminophen (TYLENOL) tablet 650 mg (650 mg Oral Given 08/08/23 1111)    Or  acetaminophen (TYLENOL) suppository 650 mg ( Rectal See Alternative 08/08/23 1111)  ondansetron (ZOFRAN) tablet 4 mg (has no administration in time range)    Or  ondansetron (ZOFRAN) injection 4 mg (has no administration in time range)  hydrALAZINE (APRESOLINE) injection 5 mg (has no administration in time range)  metoprolol succinate (TOPROL-XL) 24 hr tablet 12.5 mg (12.5 mg Oral Given 08/08/23 1248)  rosuvastatin (CRESTOR) tablet 10 mg (has no administration in time range)  ALPRAZolam (XANAX) tablet 0.5 mg (has no administration in time range)  DULoxetine (CYMBALTA) DR capsule 60 mg (60 mg Oral Given 08/08/23 1248)  levothyroxine (SYNTHROID) tablet 88 mcg (has no administration in time range)  famotidine (PEPCID) tablet 20 mg (20 mg Oral Given  08/08/23 1247)  acetaminophen (TYLENOL) tablet 1,000 mg (1,000 mg Oral Given 08/08/23 0433)  iohexol (OMNIPAQUE) 350 MG/ML injection 80 mL (80 mLs Intravenous Contrast Given 08/08/23 0346)  pantoprazole (PROTONIX) injection 40 mg (40 mg Intravenous Given 08/08/23 0654)    Mobility walks     Focused Assessments Cardiac Assessment Handoff:    Lab Results  Component Value Date   CKTOTAL 295 (H) 07/08/2022   CKMB 4.1 07/08/2022   TROPONINI <0.30 01/10/2012   Lab Results  Component Value Date   DDIMER >20.00 (H) 07/06/2022   Does the Patient currently have chest pain? No    R Recommendations: See Admitting Provider Note  Report given to:   Additional Notes:

## 2023-08-08 NOTE — Consult Note (Signed)
Referring Provider: Dr. Jonah Blue Primary Care Physician:  Adrian Prince, MD Primary Gastroenterologist:  Dr. Ned Grace  Reason for Consultation: Crohn's colitis, rectal bleeding  HPI: [name] is a 82 y.o. female with a past medical history of anxiety, hypertension, hyperlipidemia, Mobitz type I AV block, TIA, peripheral vascular disease, fibromyalgia, GERD, esophageal stricture, PUD, H. 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 difficile colitis and Crohn's disease on Remicade.  She developed mucous with streaks of red blood with small clots per the rectum on 08/07/2023 therefore she presented to the ED for further evaluation.  Labs in the ED showed a WBC count of 10.5.  Hemoglobin 9.8 with a baseline hemoglobin level 9-10.  Hematocrit 32.4.  Platelet 458.  Sodium 135.  Potassium 4.0.  BUN 21.  Creatinine 0.64.  Albumin 2.7.  Total bili 0.3.  Alk phos 45.  AST 20.  ALT 19.  CTAP showed colitis with moderate to severe inflammation involving 20 to 25 cm segment encompassing the splenic flexure and most of the descending colon with bulky circumferential mural thickening and pronounced mesenteric inflammation with mucosal hyper and enhancement without evidence of active GI bleeding.  She was started on Solu-Medrol IV 20 mg IV every 8 hours.  A GI consult was requested for further evaluation and management of Crohn's colitis.  She was previously followed by Dr. Adela Lank following her hospitalization 06/2022 secondary to sepsis. CTAP at that time was concerning for ischemic bowel. She underwent an ex lap with general surgery which showed no evidence of ischemia.  She was diagnosed with infectious colitis and discharged to rehab.  She subsequently developed C. difficile colitis which was treated with a course of Vancomycin. She was readmitted to the hospital 08/28/2022 with aggressive diarrhea, weakness and weight loss.  C.  difficile and GI pathogen panel were negative.  She underwent a colonoscopy 08/30/2022 which resulted in a poor prep but showed diffuse severe inflammation in the sigmoid, descending and transverse colon.  Biopsies were consistent with Crohn's disease.  She was started on steroids and Lialda and was discharged to rehab 09/11/2022.   She subsequently transitioned her GI management with Dr. Wallace Cullens at [facility]. She recently started Infliximab induction, the third dose completed on 07/17/2023.  She was on and off prednisone since her hospitalization 06/2022 and the last prednisone taper was completed on 07/26/2023.  She endorsed feeling quite well during her infliximab induction.  Her appetite was good and she reported passing 2-3 normal formed brown bowel movements daily.  She stated 2 to 3 days before Christmas, she developed chills and mild central abdominal pain.  On 12/25 she passed mucus with streaks of bright red blood and small blood clots x 3-4 episodes in 2 similar episodes on 12/26.  No mucus, hematochezia or blood clots passed since arriving to the ED.  She takes oral iron 3 times daily and stated passing black stools since she has been on iron.  She received IV iron x 1 one 1 month ago.  No reflux symptoms or dysphagia.  She takes Tums 2 tabs daily for calcium supplementation.  She developed patches of ecchymosis/rash after initiating infliximab and there was concern this may be infusion reaction.  However, the patient stated she was seen by a dermatologist who verified her ecchymosis to her lower extremities was secondary to prednisone use.  GI PROCEDURES:  Colonoscopy 08/30/2022: - Preparation of the colon was poor.  - Diffuse severe inflammation was found  in the sigmoid colon, in the descending colon and in the transverse colon. Biopsied.  - Internal hemorrhoids.  - Normal rectosigmoid / rectum.  Colonoscopy 02/08/2016: - Diverticulosis in the sigmoid colon. - The entire examined colon is normal on  direct and retroflexion views. - The examined portion of the ileum was normal. - No specimens collected.   EGD 02/18/2013 due to epigastric pain and dark stools Incidental esophageal stricture   Past Medical History:  Diagnosis Date   C. difficile colitis    Cancer (HCC) 2014   kidney   COVID    Crohn's disease (HCC) 04/17/2023   Esophageal stricture    Fibromyalgia    GERD (gastroesophageal reflux disease)    Helicobacter pylori gastritis    Hypertension    Intention tremor    Lumbago    Memory loss    Osteoarthritis of spine    Pneumonia    hx of years ago    PUD (peptic ulcer disease)    PVD (peripheral vascular disease) (HCC)     Past Surgical History:  Procedure Laterality Date   ABDOMINAL HYSTERECTOMY  1985   BIOPSY  08/30/2022   Procedure: BIOPSY;  Surgeon: Benancio Deeds, MD;  Location: [facility] ENDOSCOPY;  Service: Gastroenterology;;   BREAST ENHANCEMENT SURGERY  1974   CATARACT EXTRACTION Bilateral 2014   shapiro   COLONOSCOPY WITH PROPOFOL N/A 08/30/2022   Procedure: COLONOSCOPY WITH PROPOFOL;  Surgeon: Benancio Deeds, MD;  Location: [facility] ENDOSCOPY;  Service: Gastroenterology;  Laterality: N/A;   LAPAROSCOPIC PARTIAL NEPHRECTOMY Left April '13   [facility], Dr. Laverle Patter   LAPAROTOMY N/A 07/05/2022   Procedure: EXPLORATORY LAPAROTOMY;  Surgeon: Andria Meuse, MD;  Location: [facility] OR;  Service: General;  Laterality: N/A;   LUMBAR FUSION  03/2003   L4-5   TONSILLECTOMY  1958   TUBAL LIGATION  1984    Prior to Admission medications   Medication Sig Start Date End Date Taking? Authorizing Provider  acetaminophen (TYLENOL) 325 MG tablet Take 650 mg by mouth 2 (two) times daily as needed for mild pain or headache.    [provider]  ALPRAZolam [name]) 0.5 MG tablet Take 1 tablet (0.5 mg total) by mouth 2 (two) times daily as needed. for anxiety Patient taking differently: Take 0.5 mg by mouth at bedtime. for anxiety 07/16/22   Marinda Elk, MD   Carboxymethylcellulose Sodium (EYE DROPS OP) Place 2 drops into both eyes in the morning and at bedtime.    [provider]  Cyanocobalamin (VITAMIN B-12 CR PO) Take by mouth daily.    [provider]  DULoxetine (CYMBALTA) 60 MG capsule Take 60 mg by mouth daily.    [provider]  famotidine (PEPCID) 20 MG tablet Take 1 tablet (20 mg total) by mouth daily. 05/13/23   Armbruster, Willaim Rayas, MD  furosemide (LASIX) 20 MG tablet Take 1 tablet (20 mg total) by mouth daily. Patient taking differently: 20 mg. Take 1/2 tablet by mouth daily 12/25/22 06/25/23  Custovic, Rozell Searing, DO  iron polysaccharides (NIFEREX) 150 MG capsule Take 150 mg by mouth 2 (two) times daily. 12/12/22   [provider]  levothyroxine (SYNTHROID) 88 MCG tablet Take 88 mcg by mouth every morning. 09/13/22   [provider]  mesalamine (LIALDA) 1.2 g EC tablet Take 2 tablets (2.4 g total) by mouth in the morning and at bedtime. 11/22/22   Armbruster, Willaim Rayas, MD  metoprolol succinate (TOPROL-XL) 25 MG 24 hr tablet Take 12.5 mg  by mouth daily.    [provider]  Multiple Vitamins-Iron (MULTIVITAMIN/IRON PO) Take 1 tablet by mouth 2 (two) times daily.    [provider]  ondansetron (ZOFRAN-ODT) 4 MG disintegrating tablet Take 1 tablet (4 mg total) by mouth every 8 (eight) hours as needed for nausea or vomiting. Please keep your Sept appointment for further refills. Thanks. 04/10/23   Armbruster, Willaim Rayas, MD  predniSONE (DELTASONE) 20 MG tablet  04/30/23   [provider]  rosuvastatin (CRESTOR) 10 MG tablet Take 10 mg by mouth every evening.    [provider]  VITAMIN D PO Take by mouth daily.    [provider]    Current Facility-Administered Medications  Medication Dose Route Frequency Provider Last Rate Last Admin   acetaminophen (TYLENOL) tablet 650 mg  650 mg Oral Q6H PRN Jonah Blue, MD       Or   acetaminophen (TYLENOL) suppository  650 mg  650 mg Rectal Q6H PRN Jonah Blue, MD       ALPRAZolam [name]) tablet 0.5 mg  0.5 mg Oral QHS Jonah Blue, MD       DULoxetine (CYMBALTA) DR capsule 60 mg  60 mg Oral Daily Jonah Blue, MD       famotidine (PEPCID) tablet 20 mg  20 mg Oral Daily Jonah Blue, MD       hydrALAZINE (APRESOLINE) injection 5 mg  5 mg Intravenous Q4H PRN Jonah Blue, MD       lactated ringers infusion   Intravenous Continuous Jonah Blue, MD       levothyroxine (SYNTHROID) tablet 88 mcg  88 mcg Oral q morning Jonah Blue, MD       Melene Muller ON 08/09/2023] mesalamine (LIALDA) EC tablet 2.4 g  2.4 g Oral Q breakfast Jonah Blue, MD       methylPREDNISolone sodium succinate (SOLU-MEDROL) 40 mg/mL injection 20 mg  20 mg Intravenous Bobette Mo, MD   20 mg at 08/08/23 0630   metoprolol succinate (TOPROL-XL) 24 hr tablet 12.5 mg  12.5 mg Oral Daily Jonah Blue, MD       ondansetron [facility]) tablet 4 mg  4 mg Oral Q6H PRN Jonah Blue, MD       Or   ondansetron [facility]) injection 4 mg  4 mg Intravenous Q6H PRN Jonah Blue, MD       rosuvastatin (CRESTOR) tablet 10 mg  10 mg Oral QPM Jonah Blue, MD       Current Outpatient Medications  Medication Sig Dispense Refill   acetaminophen (TYLENOL) 325 MG tablet Take 650 mg by mouth 2 (two) times daily as needed for mild pain or headache.     ALPRAZolam (XANAX) 0.5 MG tablet Take 1 tablet (0.5 mg total) by mouth 2 (two) times daily as needed. for anxiety (Patient taking differently: Take 0.5 mg by mouth at bedtime. for anxiety) 5 tablet 1   Carboxymethylcellulose Sodium (EYE DROPS OP) Place 2 drops into both eyes in the morning and at bedtime.     Cyanocobalamin (VITAMIN B-12 CR PO) Take by mouth daily.     DULoxetine (CYMBALTA) 60 MG capsule Take 60 mg by mouth daily.     famotidine (PEPCID) 20 MG tablet Take 1 tablet (20 mg total) by mouth daily. 30 tablet 0   furosemide (LASIX) 20 MG tablet Take 1 tablet (20 mg total)  by mouth daily. (Patient taking differently: 20 mg. Take 1/2 tablet by mouth daily) 90 tablet 2   iron polysaccharides (  NIFEREX) 150 MG capsule Take 150 mg by mouth 2 (two) times daily.     levothyroxine (SYNTHROID) 88 MCG tablet Take 88 mcg by mouth every morning.     mesalamine (LIALDA) 1.2 g EC tablet Take 2 tablets (2.4 g total) by mouth in the morning and at bedtime. 180 tablet 3   metoprolol succinate (TOPROL-XL) 25 MG 24 hr tablet Take 12.5 mg by mouth daily.     Multiple Vitamins-Iron (MULTIVITAMIN/IRON PO) Take 1 tablet by mouth 2 (two) times daily.     ondansetron (ZOFRAN-ODT) 4 MG disintegrating tablet Take 1 tablet (4 mg total) by mouth every 8 (eight) hours as needed for nausea or vomiting. Please keep your Sept appointment for further refills. Thanks. 30 tablet 0   predniSONE (DELTASONE) 20 MG tablet      rosuvastatin (CRESTOR) 10 MG tablet Take 10 mg by mouth every evening.     VITAMIN D PO Take by mouth daily.      Allergies as of 08/07/2023 - Review Complete 08/07/2023  Allergen Reaction Noted   Morphine and codeine Itching and Rash 10/01/2011    Family History  Problem Relation Age of Onset   Heart disease Mother    COPD Mother    Thyroid disease Mother    Diabetes Father    Heart disease Father        CABG   Stroke Father    Parkinson's disease Father    Heart disease Brother    Stroke Brother    Hyperlipidemia Brother    Diabetes Daughter    Hearing loss Brother    Crohn's disease Other        neice   Hypertension Son    Diabetes Son    Melanoma Son    Colon cancer Neg Hx    Stomach cancer Neg Hx    Esophageal cancer Neg Hx     Social History   Socioeconomic History   Marital status: Widowed    Spouse name: Not on file   Number of children: 2   Years of education: BA   Highest education level: Not on file  Occupational History   Occupation: retired- [employer]   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  Tobacco Use   Smoking status:  Never   Smokeless tobacco: Never  Vaping Use   Vaping status: Never Used  Substance and Sexual Activity   Alcohol use: Not Currently    Alcohol/week: 7.0 standard drinks of alcohol    Types: 7 Glasses of wine per week    Comment: Patient no longer drinks alcohol due to her Crohn's disease   Drug use: No   Sexual activity: Not Currently  Other Topics Concern   Not on file  Social History Narrative   Lives alone   Right Handed   Drinks 2 cups coffee daily   Social Drivers of Health   Financial Resource Strain: Not on file  Food Insecurity: No Food Insecurity (11/10/2022)   Hunger Vital Sign    Worried About Running Out of Food in the Last Year: Never true    Ran Out of Food in the Last Year: Never true  Transportation Needs: No Transportation Needs (11/10/2022)   PRAPARE - Administrator, Civil Service (Medical): No    Lack of Transportation (Non-Medical): No  Physical Activity: Not on file  Stress: Not on file  Social Connections: Not on file  Intimate Partner Violence: Not At Risk (11/10/2022)   Humiliation,  Afraid, Rape, and Kick questionnaire    Fear of Current or Ex-Partner: No    Emotionally Abused: No    Physically Abused: No    Sexually Abused: No    Review of Systems: Gen: Denies fever, sweats or chills. No weight loss.  CV: Denies chest pain, palpitations or edema. Resp: Denies cough, shortness of breath of hemoptysis.  GI: Denies heartburn, dysphagia, stomach or lower abdominal pain. No diarrhea or constipation. No rectal bleeding or melena.   GU : Denies urinary burning, blood in urine, increased urinary frequency or incontinence. MS: Denies joint pain, muscles aches or weakness. Derm: Denies rash, itchiness, skin lesions or unhealing ulcers. Psych: Denies depression, anxiety, memory loss or confusion. Heme: Denies easy bruising, bleeding. Neuro:  Denies headaches, dizziness or paresthesias. Endo:  Denies any problems with DM, thyroid or adrenal  function.  Physical Exam: Vital signs in last 24 hours: Temp:  [97.6 F (36.4 C)-99.9 F (37.7 C)] 97.6 F (36.4 C) (12/27 1000) Pulse Rate:  [65-80] 65 (12/27 1000) Resp:  [10-21] 10 (12/27 1000) BP: (111-129)/(48-69) 113/61 (12/27 1000) SpO2:  [97 %-100 %] 97 % (12/27 1000) Weight:  [59 kg] 59 kg (12/26 1449)   General:  Alert, well-developed, well-nourished, pleasant and cooperative in NAD. Head:  Normocephalic and atraumatic. Eyes:  No scleral icterus. Conjunctiva pink. Ears:  Normal auditory acuity. Nose:  No deformity, discharge or lesions. Mouth:  Dentition intact. No ulcers or lesions.  Neck:  Supple. No lymphadenopathy or thyromegaly.  Lungs: Breath sounds clear throughout. No wheezes, rhonchi or crackles.  Heart:   Abdomen:   Rectal: Deferred. Musculoskeletal:  Symmetrical without gross deformities.  Pulses:  Normal pulses noted. Extremities:  Without clubbing or edema. Neurologic:  Alert and  oriented x 4. No focal deficits.  Skin:  Intact without significant lesions or rashes. Psych:  Alert and cooperative. Normal mood and affect.  Intake/Output from previous day: No intake/output data recorded. Intake/Output this shift: No intake/output data recorded.  Lab Results: Recent Labs    08/07/23 1453 08/08/23 0802  WBC 10.5 7.2  HGB 9.8* 9.3*  HCT 32.4* 29.9*  PLT 458* 398   BMET Recent Labs    08/07/23 1453 08/08/23 0802  NA 135 137  K 4.0 3.5  CL 101 106  CO2 26 24  GLUCOSE 111* 98  BUN 21 11  CREATININE 0.64 0.54  CALCIUM 8.9 8.5*   LFT Recent Labs    08/07/23 1453  PROT 6.5  ALBUMIN 2.7*  AST 20  ALT 19  ALKPHOS 45  BILITOT 0.3   PT/INR No results for input(s): "LABPROT", "INR" in the last 72 hours. Hepatitis Panel No results for input(s): "HEPBSAG", "HCVAB", "HEPAIGM", "HEPBIGM" in the last 72 hours.    Studies/Results: CT ANGIO GI BLEED Result Date: 08/08/2023 CLINICAL DATA:  82 year old female with melena and passing blood  clots for 2 days. Mucousy, bloody discharge from rectum. Inflammatory bowel disease. EXAM: CTA ABDOMEN AND PELVIS WITHOUT AND WITH CONTRAST TECHNIQUE: Multidetector CT imaging of the abdomen and pelvis was performed using the standard protocol during bolus administration of intravenous contrast. Multiplanar reconstructed images and MIPs were obtained and reviewed to evaluate the vascular anatomy. RADIATION DOSE REDUCTION: This exam was performed according to the departmental dose-optimization program which includes automated exposure control, adjustment of the mA and/or kV according to patient size and/or use of iterative reconstruction technique. CONTRAST:  80mL OMNIPAQUE IOHEXOL 350 MG/ML SOLN COMPARISON:  CT Abdomen and Pelvis 10/20/2022. FINDINGS: VASCULAR Splenic flexure,  descending colon mucosal and mesenteric hyperenhancement. But across these multiphase images no contrast extravasation into the bowel lumen is identified. Mild for age Aortoiliac calcified atherosclerosis. Normal caliber abdominal aorta. Aortic branches including the celiac, SMA, IMA appear patent with little atherosclerosis. Similar mild iliofemoral atherosclerosis. Major arterial branches are patent. Portal venous system appears patent. Review of the MIP images confirms the above findings. NON-VASCULAR Lower chest: Pleural effusions in March have resolved. Cardiomegaly appears stable. No pericardial effusion. Mild lung base atelectasis, scarring. Hepatobiliary: Negative. Pancreas: Negative. Spleen: Negative. Adrenals/Urinary Tract: Normal adrenal glands. Kidneys appears stable and within normal limits. Similar mildly to moderately distended urinary bladder, otherwise unremarkable. No urinary calculus identified. Stomach/Bowel: Moderate to severe wall thickening, inflammation of the large bowel at the splenic flexure and in the proximal descending colon (series 8, image 30) with pronounced circumferential wall thickening up to 14 mm in the  affected segments, and fairly long segment of involvement, approximately 20-25 cm of the colon. Mucosal hyperenhancement in those areas. The upstream transverse colon and downstream sigmoid colon appear more normal with retained stool. And moderate volume of retained stool is increased compared to the March CT. Normal retrocecal appendix. No dilated small bowel. Decompressed stomach and duodenum. No pneumoperitoneum. Mesenteric edema along the left gutter, but no drainable fluid or fluid collection. Lymphatic: No lymphadenopathy identified. Reproductive: Surgically absent uterus. Small volume of fluid in the vaginal fornix, significance unclear (series 8, image 66). Normal left ovary at the left pelvic inlet series 8, image 55. Probable diminutive right ovary on image 56. Other: No pelvis free fluid. Musculoskeletal: Chronic lumbar spine degeneration and previous lower lumbar fusion. No acute osseous abnormality identified. IMPRESSION: 1. Positive for Colitis with moderate to severe inflammation over a fairly long 20-25 cm segment encompassing the splenic flexure and most of the descending colon. Bulky circumferential mural thickening and pronounced mesenteric inflammation, mucosal hyperenhancement. But no CTA evidence of active GI bleeding. 2. No free air, abscess, drainable fluid. No bowel obstruction or other complicating features at this time. 3. Otherwise resolved pleural effusions and increased volume of retained stool since March. 4. Mild for age Aortic Atherosclerosis (ICD10-I70.0). Electronically Signed   By: Odessa Fleming M.D.   On: 08/08/2023 04:20    IMPRESSION/PLAN:  82 year old female diagnosed with Crohn's disease 06/2022 complicated by recurrent C. difficile in setting of steroid dependence and initiated Infliximab induction on 10/17, second infusion 10/31 and third infusion completed on 07/17/2023 at [facility] presents with central abdominal pain with blood streaks in mucus and small blood clots per the  rectum x 2 days.  No diarrhea. Last dose of Prednisone was 07/22/2023. CTA showed colitis with moderate to severe inflammation involving 20 to 25 cm segment encompassing the splenic flexure and most of the descending colon with bulky circumferential mural thickening and pronounced mesenteric inflammation with mucosal hyper and enhancement without evidence of active GI bleeding.  Hemodynamically stable. -Clear liquid diet -Continue Solu-Medrol 20 mg IV Q 8 hours  -I do not think she needs to be on oral Mesalamine at this juncture -Await further recommendations per Dr. Tomasa Rand  Chronic IDA secondary to Crohn's disease.  Hemoglobin 9.8.  Received IV iron x 1 infusion 1 month ago.  On oral iron 3 times daily.  Chronic solid black stools since starting oral iron. -CBC and iron panel in a.m. -Transfuse for hemoglobin less than 7 or as needed if symptomatic  History of GERD, PUD and H. Pylori.  No GERD symptoms.  Patient endorses having chronic  black stools since starting oral iron 3 times daily.  Received Protonix 40 mg IV in the ED.  -Continue Famotidine 20 mg daily  History of ischemic colitis s/p exploratory laparotomy without evidence of perforation or ischemia 08/2021  History of renal cell carcinoma status post partial nephrectomy   Arnaldo Natal  08/08/2023, 12:35 PM

## 2023-08-08 NOTE — ED Provider Notes (Cosign Needed)
Care assumed from Lorin Roemhildt, PA-C at shift change. Please see their note for further information.   Briefly: Patient with Crohn's disease managed at [facility] presents with abdominal pain and rectal bleeding x 2 days.   Plan: hemoglobin 9.8 down from 10.7 on 12/5, however appears to be near baseline. CTA shows colitis, no active bleed. Patient discussed with [facility] who feels patient can be managed here. Our GI on call Dr. Leonides Schanz recommends admission, NPO for GI to see later today and decide about endoscopy. Started on solu-medrol for symptoms, plan for stool studies as well which have been ordered.   Hospitalist consult pending at shift change.   7:35 AM: Discussed patient with hospitalist Dr. Ophelia Charter who accepts patient for admission.   Silva Bandy, PA-C 08/08/23 [phone]

## 2023-08-08 NOTE — ED Provider Notes (Signed)
[facility] AT [facility] Provider Note   CSN: [id number] Arrival date & time: 08/07/23  1400     History  Chief Complaint  Patient presents with   Rectal Bleeding    [name] is a 82 y.o. female with history of Crohn's [name], [name], [name], [name], [name], TIA, type I AV block, anxiety, peripheral venous insufficiency, HFpEF, hypertension, hyperlipidemia, who presents the emergency department complaining of abdominal pain and rectal bleeding.  Patient reports her stools are usually black because she takes iron supplements, but in the past 2 days she has seen some red blood and blood clots.  Reports mucousy discharge per rectum.  Feels her lower abdomen is swollen and tender.  Her legs have been slightly more swollen, and she is taking Lasix, and reports some nocturnal enuresis. Also reports associated chills and sweating at night for a little over a week.    Rectal Bleeding Associated symptoms: abdominal pain        Home Medications Prior to Admission medications   Medication Sig Start Date End Date Taking? Authorizing Provider  acetaminophen (TYLENOL) 325 MG tablet Take 650 mg by mouth 2 (two) times daily as needed for mild pain or headache.    [provider]  ALPRAZolam [name]) 0.5 MG tablet Take 1 tablet (0.5 mg total) by mouth 2 (two) times daily as needed. for anxiety Patient taking differently: Take 0.5 mg by mouth at bedtime. for anxiety 07/16/22   Marinda Elk, MD  Carboxymethylcellulose Sodium (EYE DROPS OP) Place 2 drops into both eyes in the morning and at bedtime.    [provider]  Cyanocobalamin (VITAMIN B-12 CR PO) Take by mouth daily.    [provider]  DULoxetine (CYMBALTA) 60 MG capsule Take 60 mg by mouth daily.    [provider]  famotidine (PEPCID) 20 MG tablet Take 1 tablet (20 mg total) by mouth  daily. 05/13/23   Armbruster, Willaim Rayas, MD  furosemide (LASIX) 20 MG tablet Take 1 tablet (20 mg total) by mouth daily. Patient taking differently: 20 mg. Take 1/2 tablet by mouth daily 12/25/22 06/25/23  Custovic, Rozell Searing, DO  iron polysaccharides (NIFEREX) 150 MG capsule Take 150 mg by mouth 2 (two) times daily. 12/12/22   [provider]  levothyroxine (SYNTHROID) 88 MCG tablet Take 88 mcg by mouth every morning. 09/13/22   [provider]  mesalamine (LIALDA) 1.2 g EC tablet Take 2 tablets (2.4 g total) by mouth in the morning and at bedtime. 11/22/22   Armbruster, Willaim Rayas, MD  metoprolol succinate (TOPROL-XL) 25 MG 24 hr tablet Take 12.5 mg by mouth daily.    [provider]  Multiple Vitamins-Iron (MULTIVITAMIN/IRON PO) Take 1 tablet by mouth 2 (two) times daily.    [provider]  ondansetron (ZOFRAN-ODT) 4 MG disintegrating tablet Take 1 tablet (4 mg total) by mouth every 8 (eight) hours as needed for nausea or vomiting. Please keep your Sept appointment for further refills. Thanks. 04/10/23   Armbruster, Willaim Rayas, MD  predniSONE (DELTASONE) 20 MG tablet  04/30/23   [provider]  rosuvastatin (CRESTOR) 10 MG tablet Take 10 mg by mouth every evening.    [provider]  VITAMIN D PO Take by mouth daily.    [provider]      Allergies    Morphine and codeine    Review of Systems   Review  of Systems  Gastrointestinal:  Positive for abdominal pain, blood in stool and hematochezia.  All other systems reviewed and are negative.   Physical Exam Updated Vital Signs BP (!) 117/55 (BP Location: Right Arm)   Pulse 70   Temp 97.9 F (36.6 C) (Oral)   Resp 18   Ht 5\' 1"  (1.549 m)   Wt 59 kg   SpO2 100%   BMI 24.56 kg/m  Physical Exam Vitals and nursing note reviewed. Exam conducted with a chaperone present.  Constitutional:      Appearance: Normal appearance.  HENT:     Head: Normocephalic and atraumatic.  Eyes:      Conjunctiva/sclera: Conjunctivae normal.  Cardiovascular:     Rate and Rhythm: Normal rate and regular rhythm.  Pulmonary:     Effort: Pulmonary effort is normal. No respiratory distress.     Breath sounds: Normal breath sounds.  Abdominal:     General: There is no distension.     Palpations: Abdomen is soft.     Tenderness: There is abdominal tenderness in the right lower quadrant, suprapubic area and left lower quadrant.     Comments: Tenderness in lower quadrants, L>R  Genitourinary:    Rectum: Guaiac result positive.     Comments: Melanotic stool on rectal exam Skin:    General: Skin is warm and dry.  Neurological:     General: No focal deficit present.     Mental Status: She is alert.     ED Results / Procedures / Treatments   Labs (all labs ordered are listed, but only abnormal results are displayed) Labs Reviewed  COMPREHENSIVE METABOLIC PANEL - Abnormal; Notable for the following components:      Result Value   Glucose, Bld 111 (*)    Albumin 2.7 (*)    All other components within normal limits  CBC - Abnormal; Notable for the following components:   RBC 3.46 (*)    Hemoglobin 9.8 (*)    HCT 32.4 (*)    Platelets 458 (*)    All other components within normal limits  GASTROINTESTINAL PANEL BY PCR, STOOL (REPLACES STOOL CULTURE)  C DIFFICILE QUICK SCREEN W PCR REFLEX    LIPASE, BLOOD  BRAIN NATRIURETIC PEPTIDE  URINALYSIS, ROUTINE W REFLEX MICROSCOPIC  POC OCCULT BLOOD, ED    EKG EKG Interpretation Date/Time:  Thursday August 07 2023 14:29:36 EST Ventricular Rate:  78 PR Interval:  148 QRS Duration:  80 QT Interval:  364 QTC Calculation: 414 R Axis:   74  Text Interpretation: Normal sinus rhythm with sinus arrhythmia Normal ECG When compared with ECG of 25-Jun-2023 11:35, PREVIOUS ECG IS PRESENT Confirmed by Kennis Carina [phone]) on 08/08/2023 1:36:44 AM  Radiology CT ANGIO GI BLEED Result Date: 08/08/2023 CLINICAL DATA:  82 year old female with  melena and passing blood clots for 2 days. Mucousy, bloody discharge from rectum. Inflammatory bowel [name]. EXAM: CTA ABDOMEN AND PELVIS WITHOUT AND WITH CONTRAST TECHNIQUE: Multidetector CT imaging of the abdomen and pelvis was performed using the standard protocol during bolus administration of intravenous contrast. Multiplanar reconstructed images and MIPs were obtained and reviewed to evaluate the vascular anatomy. RADIATION DOSE REDUCTION: This exam was performed according to the departmental dose-optimization program which includes automated exposure control, adjustment of the mA and/or kV according to patient size and/or use of iterative reconstruction technique. CONTRAST:  80mL OMNIPAQUE IOHEXOL 350 MG/ML SOLN COMPARISON:  CT Abdomen and Pelvis 10/20/2022. FINDINGS: VASCULAR Splenic flexure, descending colon mucosal and mesenteric hyperenhancement. But  across these multiphase images no contrast extravasation into the bowel lumen is identified. Mild for age Aortoiliac calcified atherosclerosis. Normal caliber abdominal aorta. Aortic branches including the celiac, SMA, IMA appear patent with little atherosclerosis. Similar mild iliofemoral atherosclerosis. Major arterial branches are patent. Portal venous system appears patent. Review of the MIP images confirms the above findings. NON-VASCULAR Lower chest: Pleural effusions in March have resolved. Cardiomegaly appears stable. No pericardial effusion. Mild lung base atelectasis, scarring. Hepatobiliary: Negative. Pancreas: Negative. Spleen: Negative. Adrenals/Urinary Tract: Normal adrenal glands. Kidneys appears stable and within normal limits. Similar mildly to moderately distended urinary bladder, otherwise unremarkable. No urinary calculus identified. Stomach/Bowel: Moderate to severe wall thickening, inflammation of the large bowel at the splenic flexure and in the proximal descending colon (series 8, image 30) with pronounced circumferential wall  thickening up to 14 mm in the affected segments, and fairly long segment of involvement, approximately 20-25 cm of the colon. Mucosal hyperenhancement in those areas. The upstream transverse colon and downstream sigmoid colon appear more normal with retained stool. And moderate volume of retained stool is increased compared to the March CT. Normal retrocecal appendix. No dilated small bowel. Decompressed stomach and duodenum. No pneumoperitoneum. Mesenteric edema along the left gutter, but no drainable fluid or fluid collection. Lymphatic: No lymphadenopathy identified. Reproductive: Surgically absent uterus. Small volume of fluid in the vaginal fornix, significance unclear (series 8, image 66). Normal left ovary at the left pelvic inlet series 8, image 55. Probable diminutive right ovary on image 56. Other: No pelvis free fluid. Musculoskeletal: Chronic lumbar spine degeneration and previous lower lumbar fusion. No acute osseous abnormality identified. IMPRESSION: 1. Positive for Colitis with moderate to severe inflammation over a fairly long 20-25 cm segment encompassing the splenic flexure and most of the descending colon. Bulky circumferential mural thickening and pronounced mesenteric inflammation, mucosal hyperenhancement. But no CTA evidence of active GI bleeding. 2. No free air, abscess, drainable fluid. No bowel obstruction or other complicating features at this time. 3. Otherwise resolved pleural effusions and increased volume of retained stool since March. 4. Mild for age Aortic Atherosclerosis (ICD10-I70.0). Electronically Signed   By: Odessa Fleming M.D.   On: 08/08/2023 04:20    Procedures Procedures    Medications Ordered in ED Medications  methylPREDNISolone sodium succinate (SOLU-MEDROL) 40 mg/mL injection 20 mg (20 mg Intravenous Given 08/08/23 0630)  pantoprazole (PROTONIX) injection 40 mg (has no administration in time range)  acetaminophen (TYLENOL) tablet 1,000 mg (1,000 mg Oral Given  08/08/23 0433)  iohexol (OMNIPAQUE) 350 MG/ML injection 80 mL (80 mLs Intravenous Contrast Given 08/08/23 0346)    ED Course/ Medical Decision Making/ A&P Clinical Course as of 08/08/23 8295  Fri Aug 08, 2023  0150 Hemoccult positive, rectal exam with melanotic stool [LR]    Clinical Course User Index [LR] Soraya Paquette, Lora Paula, PA-C                                 Medical Decision Making Amount and/or Complexity of Data Reviewed Labs: ordered. Radiology: ordered.  Risk OTC drugs. Prescription drug management.   This patient is a 82 y.o. female who presents to the ED for concern of rectal bleeding, this involves an extensive number of treatment options, and is a complaint that carries with it a high risk of complications and morbidity. The emergent differential diagnosis prior to evaluation includes, but is not limited to,  Diverticulitis, IBD, colitis, mesenteric ischemia, colorectal  cancer / polyps, hemorrhoids, rectal foreign body, anal fissure. This is not an exhaustive differential.   Past Medical History / Co-morbidities / Social History: Crohn's [name], [name], [name], [name], [name], TIA, type I AV block, anxiety, peripheral venous insufficiency, HFpEF, hypertension, hyperlipidemia  Additional history: Chart reviewed. Pertinent results include: reviewed GI records with [facility], and [facility] cardiology  Physical Exam: Physical exam performed. The pertinent findings include: Normal vital signs, no acute distress.  Abdomen soft, with some tenderness in lower quadrants, left greater than right.  Melanotic stool on exam, guaiac positive.  Lab Tests: I ordered, and personally interpreted labs.  The pertinent results include: Hemoglobin 9.8 (compared to 10.7 on 12/5, baseline appears to be around 9-10), CMP unremarkable.  Normal lipase.  Hemoccult positive.  Imaging Studies: I ordered imaging  studies including CT abdomen/pelvis angiogram. I independently visualized and interpreted imaging which showed positive for colitis with moderate to severe inflammation over a 20 to 25 cm segment including this to the descending colon, as well as circumferential mural thickening and mesenteric inflammation, no evidence of active bleed.. I agree with the radiologist interpretation.   Cardiac Monitoring:  The patient was maintained on a cardiac monitor.  My attending physician Dr. Pilar Plate viewed and interpreted the cardiac monitored which showed an underlying rhythm of: sinus rhythm with sinus arrhythmia. I agree with this interpretation.   Medications: I ordered medication including tylenol  for abdominal pain, solumedrol and protonix. Reevaluation of the patient after these medicines showed that the patient improved. I have reviewed the patients home medicines and have made adjustments as needed.  Consultations Obtained: I requested consultation with the gastroenterologist on call with [facility] Dr Wilson Singer,  and discussed lab and imaging findings as well as pertinent plan - they recommend: GI consultation for upper endoscopy.  He reviewed the patient's imaging and her current treatment plan, and is not sure if some of the inflammation is part of her [name] course, but does not appear acute.  Although with concern of melena on exam, felt that she needed an upper endoscopy for further evaluation, but feels this can be done with our group and is not requiring transfer to [facility].  I consulted with on call gastroenterologist Dr Leonides Schanz who recommended starting 20 mg Solu-Medrol Q8, obtain GI pathogen panel and C. difficile testing, and they will consult inpatient.  Recommend keeping her n.p.o. and will decide about endoscopy.   Disposition: Patient discussed and care transferred to Smoot PA-C at shift change. Please see his/her note for further details regarding further ED course and disposition. Plan at time of handoff  is pending consultation to hospitalist.   Patient would benefit from admission for further evaluation of melanotic stool, concerning for upper GI bleed.    I discussed this case with my attending physician Dr. Pilar Plate who cosigned this note including patient's presenting symptoms, physical exam, and planned diagnostics and interventions. Attending physician stated agreement with plan or made changes to plan which were implemented.    Final Clinical Impression(s) / ED Diagnoses Final diagnoses:  Colitis  Melena    Rx / DC Orders ED Discharge Orders     None      Portions of this report may have been transcribed using voice recognition software. Every effort was made to ensure accuracy; however, inadvertent computerized transcription errors may be present.    Jeanella Flattery 08/08/23 [id number]    Sabas Sous,  MD 08/08/23 [phone]

## 2023-08-08 NOTE — Telephone Encounter (Signed)
Inbound call from patient stating she was advised that her stool sample will be sent to our lab for Dr. Tomasa Rand to review.

## 2023-08-09 LAB — GASTROINTESTINAL PANEL BY PCR, STOOL (REPLACES STOOL CULTURE)

## 2023-08-12 NOTE — Telephone Encounter (Signed)
 Thanks for the update. Ultimately this patient needs to clarify where she wishes to receive her care. She wanted a tertiary care center opinion several months ago as her case was complicated, she has been seeing [facility]. If they are managing her Remicade and IBD at this point she needs to call them for further management. If she wishes to transition her care back with me I am happy to, but can't be using multiple providers for this specific issue. Can you please ask her how she wishes to proceed and we can go from there. Thanks

## 2023-08-12 NOTE — Telephone Encounter (Signed)
 Dr. Stacia, I did not receive any communication from the ED physician or hospitalist prior to her being discharged. I do not see a documented discharge summary in Epic which is atypical.   Elspeth, pls contact patient today and obtain symptom update and verify if patient is taking Prednisone  or not. I am adding Dr. Leigh as he is patient's primary GI.

## 2023-08-12 NOTE — Telephone Encounter (Signed)
 Pt was contacted for symptom update and question how she would like to proceed with care. Pt stated that she is feeling much better now. Pt stated that the reason that she reached out to [facility] was because they were the ones that were consulted while she was in the hospital. Pt stated that she plans to stay with Dr. Elnor at [facility] and keep her care there. Pt has shared her recent stool results that she seen on my chart with [facility]. Pt stated that she appreciated the care that [facility] provided while she was in the hospital.

## 2023-08-19 ENCOUNTER — Other Ambulatory Visit: Payer: Self-pay | Admitting: Internal Medicine

## 2023-08-20 LAB — COMPREHENSIVE METABOLIC PANEL
ALT: 50 [IU]/L — ABNORMAL HIGH (ref 0–32)
AST: 24 [IU]/L (ref 0–40)
Albumin: 3.9 g/dL (ref 3.7–4.7)
Alkaline Phosphatase: 75 [IU]/L (ref 44–121)
BUN/Creatinine Ratio: 45 — ABNORMAL HIGH (ref 12–28)
BUN: 25 mg/dL (ref 8–27)
Bilirubin Total: <0.2 mg/dL (ref 0.0–1.2)
CO2: 28 mmol/L (ref 20–29)
Calcium: 9.3 mg/dL (ref 8.7–10.3)
Chloride: 96 mmol/L (ref 96–106)
Creatinine, Ser: 0.55 mg/dL — ABNORMAL LOW (ref 0.57–1.00)
Globulin, Total: 2.7 g/dL (ref 1.5–4.5)
Glucose: 125 mg/dL — ABNORMAL HIGH (ref 70–99)
Potassium: 4.8 mmol/L (ref 3.5–5.2)
Sodium: 136 mmol/L (ref 134–144)
Total Protein: 6.6 g/dL (ref 6.0–8.5)
eGFR: 91 mL/min/{1.73_m2} (ref >59)

## 2023-08-20 LAB — PRO B NATRIURETIC PEPTIDE: NT-Pro BNP: 1021 pg/mL — ABNORMAL HIGH (ref 0–738)

## 2023-09-02 DIAGNOSIS — I5032 Chronic diastolic (congestive) heart failure: Secondary | ICD-10-CM | POA: Insufficient documentation

## 2023-09-02 DIAGNOSIS — I471 Supraventricular tachycardia, unspecified: Secondary | ICD-10-CM | POA: Insufficient documentation

## 2023-09-02 DIAGNOSIS — I2510 Atherosclerotic heart disease of native coronary artery without angina pectoris: Secondary | ICD-10-CM | POA: Insufficient documentation

## 2023-09-02 NOTE — Progress Notes (Unsigned)
Cardiology Office Note:    Date:  09/03/2023  ID:  [name], DOB 01/01/1941, MRN [id number] PCP: Adrian Prince, MD  [facility] Providers Cardiologist:  Christell Constant, MD       Patient Profile:      Coronary artery calcification (HFpEF) heart failure with preserved ejection fraction  TTE 11/11/2022: EF 55-60, no RWMA, GR 1 DD, normal RVSF, moderate pulmonary hypertension, mild MR, AV sclerosis, RVSP 55.4 Mobitz type I Hx of TIA Supraventricular Tachycardia  Monitor 07/2023: Average heart rate 74, frequent paroxysmal SVT (longest 29 seconds), occasional PACs Hypertension w labile BPs Hyperlipidemia Aortic atherosclerosis Renal CA Crohn's disease GERD Hx hyponatremia, hypoalbuminemia Hx perforated colon, sepsis 07/2022, c/b C. difficile Tx fecal transplant>> referred to Crohn's division at [facility] (Rx infliximab)>>c/b severe debilitation/deconditioning         History of Present Illness:  Discussed the use of AI scribe software for clinical note transcription with the patient, who gave verbal consent to proceed.  [name] is a 83 y.o. female who returns for follow up of CHF. She was last seen by Dr. Izora Ribas in 06/2023.  She was noted to be volume overloaded.  There was concern that volume overload is related to protein calorie malnutrition and chronic steroid use rather than intrinsic heart failure.  Follow-up NT-proBNP off of prednisone therapy was elevated at 974.  Diuretics were adjusted for volume management.  Heart monitor was arranged for PACs.  This demonstrated asymptomatic supraventricular tachycardia and occasional PACs.  Repeat NT-proBNP 08/19/2023 was even higher at 1021.  However, patient was not having symptoms of shortness of breath.  Therefore diuretics were not adjusted further.  She is here alone. She reported a decrease in swelling and edema in her feet and legs since the adjustment, with no significant shortness of breath. The  patient denied any chest pain, pressure, or tightness, and reported no difficulty in breathing during her daily walks, which she undertakes five times a day for 25 minutes each time. The patient also has a history of Crohn's disease, which is currently not well-controlled, leading to frequent Remicade infusions every four weeks and a recent return to prednisone therapy following a flare-up. The patient expressed hope that the Remicade would eventually allow her to discontinue prednisone. In addition to her heart and Crohn's disease, the patient reported issues with incontinence, particularly at night, and has an upcoming appointment with a urologist to address this.     Review of Systems  Genitourinary:  Positive for bladder incontinence.  -See HPI    Studies Reviewed:       Labs-chart review 08/08/2023: Hemoglobin 9.3 08/19/2023: K+ 4.8, creatinine 0.55, ALT 50, NT-proBNP 1001        Risk Assessment/Calculations:             Physical Exam:   VS:  BP 110/70   Pulse 60   Ht 5' (1.524 m)   Wt 129 lb 3.2 oz (58.6 kg)   SpO2 97%   BMI 25.23 kg/m    Wt Readings from Last 3 Encounters:  09/03/23 129 lb 3.2 oz (58.6 kg)  08/07/23 130 lb (59 kg)  06/25/23 131 lb (59.4 kg)    Constitutional:      Appearance: Healthy appearance. Not in distress.  Neck:     Vascular: JVD normal.  Pulmonary:     Breath sounds: Normal breath sounds. No wheezing. No rales.  Cardiovascular:     Normal rate. Regular rhythm.  Murmurs: There is no murmur.  Edema:    Peripheral edema absent.  Abdominal:     Palpations: Abdomen is soft.        Assessment and Plan:   Assessment & Plan Chronic heart failure with preserved ejection fraction (HCC) EF 55-60 with mild diastolic dysfunction on echocardiogram April 2024.  She is NYHA IIb.  Volume status appears stable.  She has a history of urinary incontinence.  Therefore, I do not think she is a good candidate for SGLT2 inhibitor.  Since her volume status  is stable, I do not think we need to add MRA at this time. -Continue furosemide 20 mg daily -Follow-up 6 months Coronary artery calcification Noted on prior CT.  She is doing well without chest discomfort to suggest angina.  Continue Crestor 10 mg daily. SVT (supraventricular tachycardia) (HCC) Asymptomatic SVT noted on monitoring in December 2024. -Continue metoprolol succinate 12.5 mg daily Pulmonary hypertension, unspecified (HCC) RVSP 55.4 on echocardiogram April 2024. -Arrange follow-up echo April 2025.      Dispo:  Return in about 6 months (around 03/02/2024) for Routine Follow Up, w/ Dr. Izora Ribas.  Signed, Tereso Newcomer, PA-C

## 2023-09-03 ENCOUNTER — Encounter: Payer: Self-pay | Admitting: Physician Assistant

## 2023-09-03 ENCOUNTER — Ambulatory Visit: Payer: Medicare Other | Attending: Physician Assistant | Admitting: Physician Assistant

## 2023-09-03 VITALS — BP 110/70 | HR 60 | Ht 60.0 in | Wt 129.2 lb

## 2023-09-03 DIAGNOSIS — I5032 Chronic diastolic (congestive) heart failure: Secondary | ICD-10-CM | POA: Diagnosis not present

## 2023-09-03 DIAGNOSIS — I272 Pulmonary hypertension, unspecified: Secondary | ICD-10-CM | POA: Insufficient documentation

## 2023-09-03 DIAGNOSIS — I2510 Atherosclerotic heart disease of native coronary artery without angina pectoris: Secondary | ICD-10-CM

## 2023-09-03 DIAGNOSIS — I471 Supraventricular tachycardia, unspecified: Secondary | ICD-10-CM | POA: Diagnosis not present

## 2023-09-03 NOTE — Assessment & Plan Note (Signed)
Noted on prior CT.  She is doing well without chest discomfort to suggest angina.  Continue Crestor 10 mg daily.

## 2023-09-03 NOTE — Assessment & Plan Note (Signed)
Asymptomatic SVT noted on monitoring in December 2024. -Continue metoprolol succinate 12.5 mg daily

## 2023-09-03 NOTE — Assessment & Plan Note (Signed)
EF 55-60 with mild diastolic dysfunction on echocardiogram April 2024.  She is NYHA IIb.  Volume status appears stable.  She has a history of urinary incontinence.  Therefore, I do not think she is a good candidate for SGLT2 inhibitor.  Since her volume status is stable, I do not think we need to add MRA at this time. -Continue furosemide 20 mg daily -Follow-up 6 months

## 2023-09-03 NOTE — Assessment & Plan Note (Signed)
RVSP 55.4 on echocardiogram April 2024. -Arrange follow-up echo April 2025.

## 2023-09-03 NOTE — Patient Instructions (Addendum)
Medication Instructions:  Your physician recommends that you continue on your current medications as directed. Please refer to the Current Medication list given to you today.    *If you need a refill on your cardiac medications before your next appointment, please call your pharmacy*   Lab Work: None    If you have labs (blood work) drawn today and your tests are completely normal, you will receive your results only by: MyChart Message (if you have MyChart) OR A paper copy in the mail If you have any lab test that is abnormal or we need to change your treatment, we will call you to review the results.   Testing/Procedures: Echo will be scheduled at [facility] in [place] 2025.  Your physician has requested that you have an echocardiogram. Echocardiography is a painless test that uses sound waves to create images of your heart. It provides your doctor with information about the size and shape of your heart and how well your heart's chambers and valves are working. This procedure takes approximately one hour. There are no restrictions for this procedure. Please do NOT wear cologne, perfume, aftershave, or lotions (deodorant is allowed). Please arrive 15 minutes prior to your appointment time.    Follow-Up: At [facility], you and your health needs are our priority.  As part of our continuing mission to provide you with exceptional heart care, we have created designated Provider Care Teams.  These Care Teams include your primary Cardiologist (physician) and Advanced Practice Providers (APPs -  Physician Assistants and Nurse Practitioners) who all work together to provide you with the care you need, when you need it.  We recommend signing up for the patient portal called "MyChart".  Sign up information is provided on this After Visit Summary.  MyChart is used to connect with patients for Virtual Visits (Telemedicine).  Patients are able to view lab/test results, encounter  notes, upcoming appointments, etc.  Non-urgent messages can be sent to your provider as well.   To learn more about what you can do with MyChart, go to [email or web address].    Your next appointment:   6 month(s)  The format for your next appointment:   In Person  Provider:   Christell Constant, MD    Other Instructions

## 2023-09-23 ENCOUNTER — Other Ambulatory Visit: Payer: Self-pay

## 2023-09-23 ENCOUNTER — Emergency Department (HOSPITAL_COMMUNITY)
Admission: EM | Admit: 2023-09-23 | Discharge: 2023-09-24 | Disposition: A | Discharge status: Home / Self Care | Payer: Medicare Other | Attending: Emergency Medicine | Admitting: Emergency Medicine

## 2023-09-23 ENCOUNTER — Encounter (HOSPITAL_COMMUNITY): Payer: Self-pay

## 2023-09-23 ENCOUNTER — Emergency Department (HOSPITAL_COMMUNITY): Payer: Medicare Other

## 2023-09-23 DIAGNOSIS — R1032 Left lower quadrant pain: Secondary | ICD-10-CM | POA: Diagnosis present

## 2023-09-23 DIAGNOSIS — K50919 Crohn's disease, unspecified, with unspecified complications: Secondary | ICD-10-CM | POA: Diagnosis not present

## 2023-09-23 DIAGNOSIS — K5900 Constipation, unspecified: Secondary | ICD-10-CM | POA: Insufficient documentation

## 2023-09-23 LAB — CBC WITH DIFFERENTIAL/PLATELET
Abs Immature Granulocytes: 0.04 10*3/uL (ref 0.00–0.07)
Basophils Absolute: 0 10*3/uL (ref 0.0–0.1)
Basophils Relative: 0 %
Eosinophils Absolute: 0.1 10*3/uL (ref 0.0–0.5)
Eosinophils Relative: 1 %
HCT: 34.1 % — ABNORMAL LOW (ref 36.0–46.0)
Hemoglobin: 10.5 g/dL — ABNORMAL LOW (ref 12.0–15.0)
Immature Granulocytes: 0 %
Lymphocytes Relative: 46 %
Lymphs Abs: 4.1 10*3/uL — ABNORMAL HIGH (ref 0.7–4.0)
MCH: 28.2 pg (ref 26.0–34.0)
MCHC: 30.8 g/dL (ref 30.0–36.0)
MCV: 91.7 fL (ref 80.0–100.0)
Monocytes Absolute: 0.7 10*3/uL (ref 0.1–1.0)
Monocytes Relative: 8 %
Neutro Abs: 4.1 10*3/uL (ref 1.7–7.7)
Neutrophils Relative %: 45 %
Platelets: 364 10*3/uL (ref 150–400)
RBC: 3.72 MIL/uL — ABNORMAL LOW (ref 3.87–5.11)
RDW: 15.3 % (ref 11.5–15.5)
WBC: 9 10*3/uL (ref 4.0–10.5)
nRBC: 0 % (ref 0.0–0.2)

## 2023-09-23 LAB — COMPREHENSIVE METABOLIC PANEL
ALT: 22 U/L (ref 0–44)
AST: 18 U/L (ref 15–41)
Albumin: 2.9 g/dL — ABNORMAL LOW (ref 3.5–5.0)
Alkaline Phosphatase: 47 U/L (ref 38–126)
Anion gap: 11 (ref 5–15)
BUN: 30 mg/dL — ABNORMAL HIGH (ref 8–23)
CO2: 25 mmol/L (ref 22–32)
Calcium: 8.9 mg/dL (ref 8.9–10.3)
Chloride: 98 mmol/L (ref 98–111)
Creatinine, Ser: 0.57 mg/dL (ref 0.44–1.00)
GFR, Estimated: >60 mL/min (ref >60)
Glucose, Bld: 97 mg/dL (ref 70–99)
Potassium: 4.3 mmol/L (ref 3.5–5.1)
Sodium: 134 mmol/L — ABNORMAL LOW (ref 135–145)
Total Bilirubin: 0.2 mg/dL (ref 0.0–1.2)
Total Protein: 6.4 g/dL — ABNORMAL LOW (ref 6.5–8.1)

## 2023-09-23 LAB — LIPASE, BLOOD: Lipase: 26 U/L (ref 11–51)

## 2023-09-23 MED ORDER — POLYETHYLENE GLYCOL 3350 17 G PO PACK: 17.0000 g | PACK | Freq: Every day | ORAL | 0 refills | Status: DC

## 2023-09-23 MED ORDER — IOHEXOL 350 MG/ML SOLN
75.0000 mL | Freq: Once | INTRAVENOUS | Status: AC | PRN
  Administered 2023-09-23: 75 mL via INTRAVENOUS

## 2023-09-23 NOTE — Discharge Instructions (Signed)
Take the MiraLAX to help with your constipation.  Follow-up with your GI doctor to review the CT scan findings and discuss further treatment

## 2023-09-23 NOTE — ED Triage Notes (Signed)
Reports hx of crohns and has a rigid mass in LUQ that apepars Saturday and has now extended to right upper quad.  Patient complains of tenderness on touch.  No n/v reported no fever.  Spoke with [facility] MD in crohns department and was advised to come to ER

## 2023-09-23 NOTE — ED Provider Notes (Signed)
[place] EMERGENCY DEPARTMENT AT [facility] Provider Note   CSN: [id number] Arrival date & time: 09/23/23  1553     History {Add pertinent medical, surgical, social history, OB history to HPI:1} Chief Complaint  Patient presents with   Bloated    [name] is a 83 y.o. female.  HPI   Patient has a history of peptic ulcer disease, peripheral vascular disease, fibromyalgia, acid reflux, colitis, Crohn's disease.  She has had prior exploratory laparotomy in her abdomen in the past.  She has also had a left partial nephrectomy and a hysterectomy.  Patient presents with complaints of tenderness in the left side of her abdomen.  Patient states she has felt swelling and a mass in her left side of her abdomen.  Patient states it has been tender to the touch.  She has not had any nausea vomiting or fever.  She states she has been having regular bowel movements.  She sent a message to her GI doctor the other day to ask them what to do.  They got back to her to her today and suggested she go to the emergency room to be evaluated  Home Medications Prior to Admission medications   Medication Sig Start Date End Date Taking? Authorizing Provider  acetaminophen (TYLENOL) 325 MG tablet Take 650 mg by mouth 2 (two) times daily as needed for mild pain or headache.    [provider]  ALPRAZolam [name]) 0.5 MG tablet Take 0.5 mg by mouth at bedtime.    [provider]  Carboxymethylcellulose Sodium (EYE DROPS OP) Place 2 drops into both eyes in the morning and at bedtime.    [provider]  cyanocobalamin 2000 MCG tablet Take 2,000 mcg by mouth daily.    [provider]  DULoxetine (CYMBALTA) 60 MG capsule Take 60 mg by mouth daily.    [provider]  famotidine (PEPCID) 20 MG tablet Take 1 tablet (20 mg total) by mouth daily. 05/13/23   Armbruster, Willaim Rayas, MD  furosemide (LASIX) 20 MG tablet Take 1 tablet (20 mg total) by mouth daily.  12/25/22 09/03/23  Custovic, Rozell Searing, DO  iron polysaccharides (NIFEREX) 150 MG capsule Take 150 mg by mouth in the morning, at noon, and at bedtime. 12/12/22   [provider]  levothyroxine (SYNTHROID) 88 MCG tablet Take 88 mcg by mouth every morning. 09/13/22   [provider]  metoprolol succinate (TOPROL-XL) 25 MG 24 hr tablet Take 12.5 mg by mouth daily.    [provider]  Multiple Vitamins-Iron (MULTIVITAMIN/IRON PO) Take 1 tablet by mouth 2 (two) times daily.    [provider]  ondansetron (ZOFRAN-ODT) 4 MG disintegrating tablet Take 1 tablet (4 mg total) by mouth every 8 (eight) hours as needed for nausea or vomiting. Please keep your Sept appointment for further refills. Thanks. 04/10/23   Armbruster, Willaim Rayas, MD  predniSONE (DELTASONE) 10 MG tablet Take 10 mg by mouth 2 (two) times daily with a meal. 08/12/23 09/23/23  [provider]  rosuvastatin (CRESTOR) 10 MG tablet Take 10 mg by mouth every evening.    [provider]  VITAMIN D PO Take 1 capsule by mouth daily.    [provider]      Allergies    Morphine and codeine    Review of Systems   Review of Systems  Physical Exam Updated Vital Signs BP 130/64 (BP Location: Right Arm)   Pulse 65   Temp 98.2 F (36.8  C) (Oral)   Resp 17   Ht 1.524 m (5')   Wt 58.5 kg   SpO2 98%   BMI 25.19 kg/m  Physical Exam Vitals and nursing note reviewed.  Constitutional:      General: She is not in acute distress.    Appearance: She is well-developed.  HENT:     Head: Normocephalic and atraumatic.     Right Ear: External ear normal.     Left Ear: External ear normal.  Eyes:     General: No scleral icterus.       Right eye: No discharge.        Left eye: No discharge.     Conjunctiva/sclera: Conjunctivae normal.  Neck:     Trachea: No tracheal deviation.  Cardiovascular:     Rate and Rhythm: Normal rate and regular rhythm.  Pulmonary:     Effort: Pulmonary effort is  normal. No respiratory distress.     Breath sounds: Normal breath sounds. No stridor. No wheezing or rales.  Abdominal:     General: Bowel sounds are normal. There is no distension.     Palpations: Abdomen is soft.     Tenderness: There is abdominal tenderness. There is no guarding or rebound.     Comments: Tenderness palpation left side of the abdomen primarily left lower quadrant  Musculoskeletal:        General: No tenderness or deformity.     Cervical back: Neck supple.  Skin:    General: Skin is warm and dry.     Findings: No rash.  Neurological:     General: No focal deficit present.     Mental Status: She is alert.     Cranial Nerves: No cranial nerve deficit, dysarthria or facial asymmetry.     Sensory: No sensory deficit.     Motor: No abnormal muscle tone or seizure activity.     Coordination: Coordination normal.  Psychiatric:        Mood and Affect: Mood normal.     ED Results / Procedures / Treatments   Labs (all labs ordered are listed, but only abnormal results are displayed) Labs Reviewed  COMPREHENSIVE METABOLIC PANEL - Abnormal; Notable for the following components:      Result Value   Sodium 134 (*)    BUN 30 (*)    Total Protein 6.4 (*)    Albumin 2.9 (*)    All other components within normal limits  CBC WITH DIFFERENTIAL/PLATELET - Abnormal; Notable for the following components:   RBC 3.72 (*)    Hemoglobin 10.5 (*)    HCT 34.1 (*)    Lymphs Abs 4.1 (*)    All other components within normal limits  LIPASE, BLOOD  URINALYSIS, ROUTINE W REFLEX MICROSCOPIC    EKG None  Radiology No results found.  Procedures Procedures  {Document cardiac monitor, telemetry assessment procedure when appropriate:1}  Medications Ordered in ED Medications - No data to display  ED Course/ Medical Decision Making/ A&P   {   Click here for ABCD2, HEART and other calculatorsREFRESH Note before signing :1}                              Medical Decision  Making Amount and/or Complexity of Data Reviewed Labs: ordered.   ***  {Document critical care time when appropriate:1} {Document review of labs and clinical decision tools ie heart score, Chads2Vasc2 etc:1}  {Document your independent review  of radiology images, and any outside records:1} {Document your discussion with family members, caretakers, and with consultants:1} {Document social determinants of health affecting pt's care:1} {Document your decision making why or why not admission, treatments were needed:1} Final Clinical Impression(s) / ED Diagnoses Final diagnoses:  None    Rx / DC Orders ED Discharge Orders     None

## 2023-09-25 ENCOUNTER — Other Ambulatory Visit (HOSPITAL_COMMUNITY): Payer: Medicare Other

## 2023-09-30 ENCOUNTER — Other Ambulatory Visit: Payer: Self-pay | Admitting: Internal Medicine

## 2023-10-01 ENCOUNTER — Ambulatory Visit: Payer: Medicare Other | Admitting: Nurse Practitioner

## 2023-10-06 ENCOUNTER — Other Ambulatory Visit: Payer: Self-pay

## 2023-10-06 MED ORDER — ROSUVASTATIN CALCIUM 10 MG PO TABS: 10.0000 mg | ORAL_TABLET | Freq: Every evening | ORAL | 3 refills | Status: DC

## 2023-10-08 ENCOUNTER — Other Ambulatory Visit: Payer: Self-pay | Admitting: Internal Medicine

## 2023-10-13 ENCOUNTER — Ambulatory Visit: Payer: Medicare Other | Admitting: Neurology

## 2023-10-16 ENCOUNTER — Telehealth: Payer: Self-pay | Admitting: Internal Medicine

## 2023-10-16 MED ORDER — METOPROLOL SUCCINATE ER 25 MG PO TB24: 12.5000 mg | ORAL_TABLET | Freq: Every day | ORAL | 3 refills | Status: DC

## 2023-10-16 NOTE — Telephone Encounter (Signed)
*  STAT* If patient is at the pharmacy, call can be transferred to refill team.   1. Which medications need to be refilled? (please list name of each medication and dose if known)   metoprolol succinate (TOPROL-XL) 25 MG 24 hr tablet     4. Which pharmacy/location (including street and city if local pharmacy) is medication to be sent to? [facility] [place], [place] - [address] [place] Phone: [phone]  Fax: [phone]       5. Do they need a 30 day or 90 day supply? 90

## 2023-11-02 IMAGING — CR DG MYELOGRAPHY LUMBAR INJ MULTI REGION
12 of 24 series · 12 of 24 positions shown · non-contrast
Comparison: MRI lumbar spine dated June 16, 2015. Cervical spine
x-rays dated May 04, 2014.

CLINICAL DATA: Balance difficulties with several falls. Right hip
and leg pain and tingling to the foot. History of prior lumbar
surgery in 6003. No prior neck surgery. Denies arm complaints.
TECHNIQUE: Contiguous axial images were obtained through the cervical and
lumbar spine after the intrathecal infusion of contrast. Coronal and
sagittal reconstructions were obtained of the axial image sets.

[w lumbar spine ap]
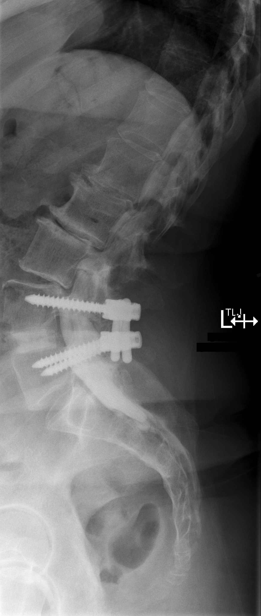

[w lumbar spine flexion]
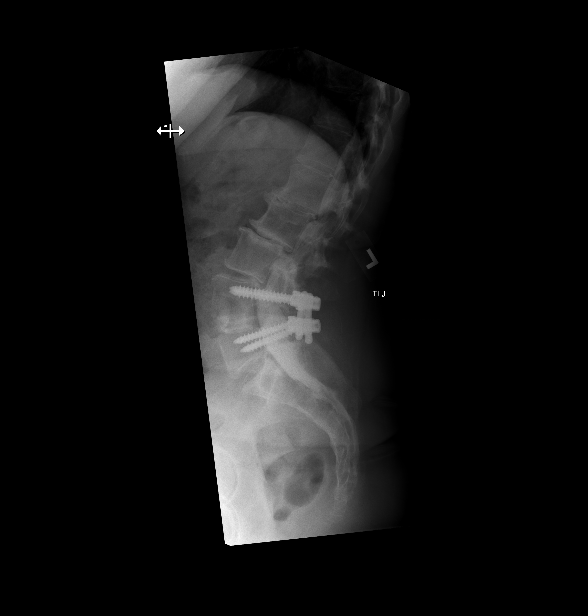

[w lumbar spine extension]
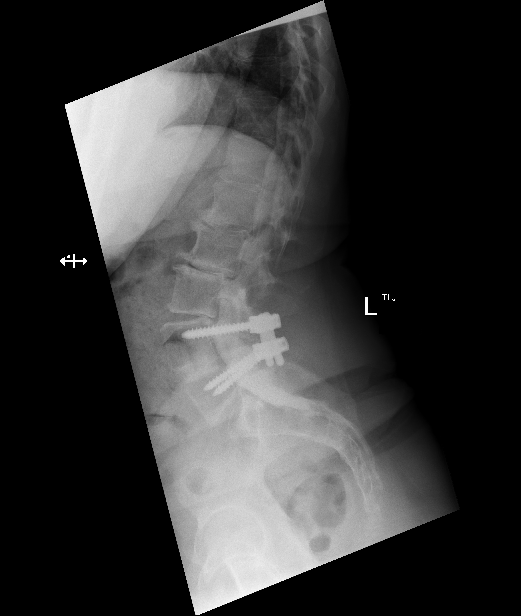

[vasc standard (1 of 9)]
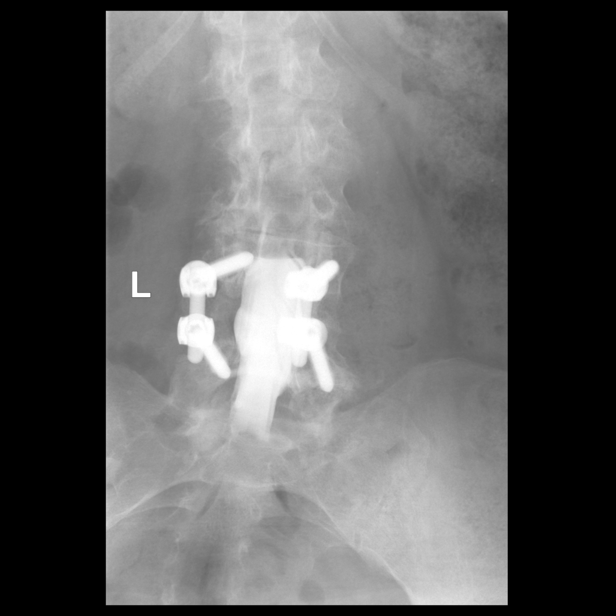

[vasc standard (2 of 9)]
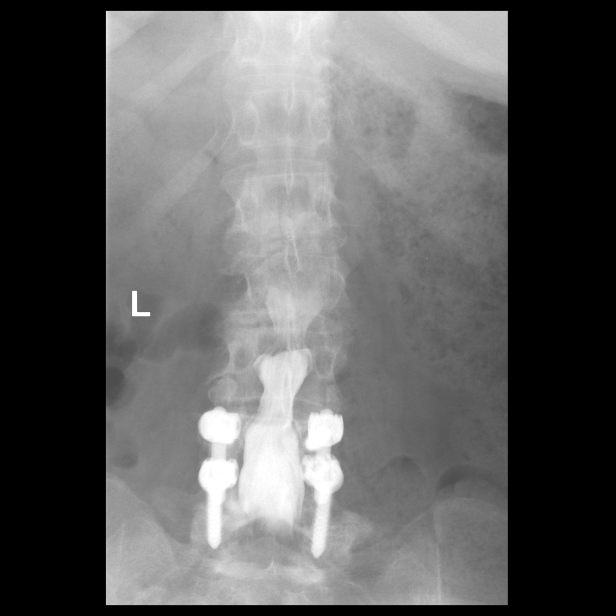

[vasc standard (3 of 9)]
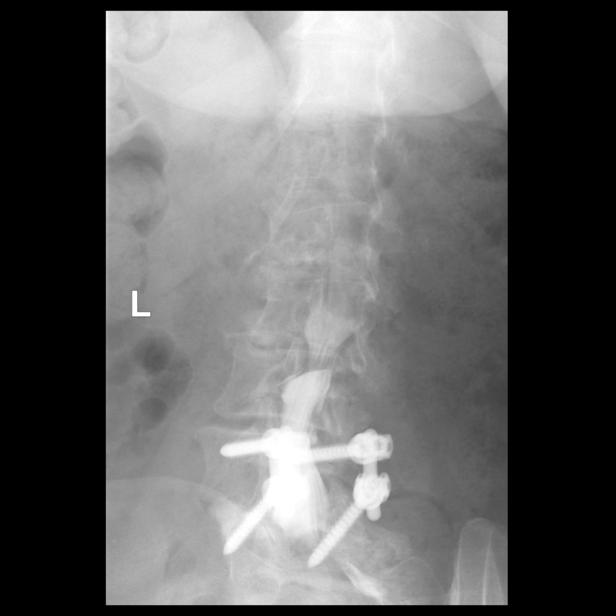

[vasc standard (4 of 9)]
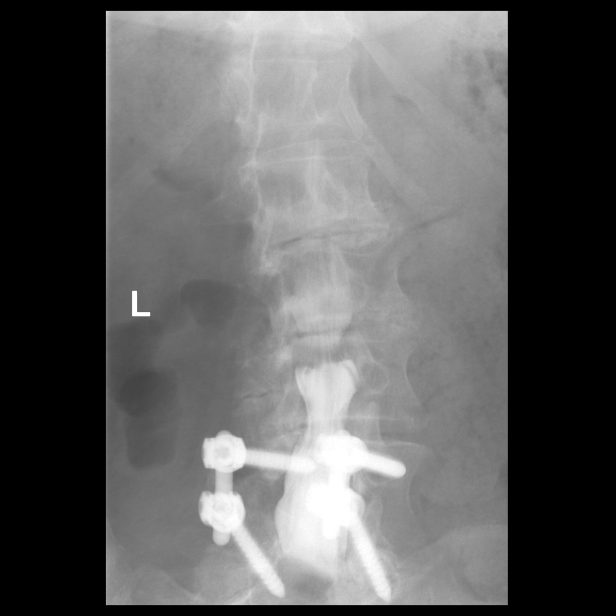

[vasc standard (5 of 9)]
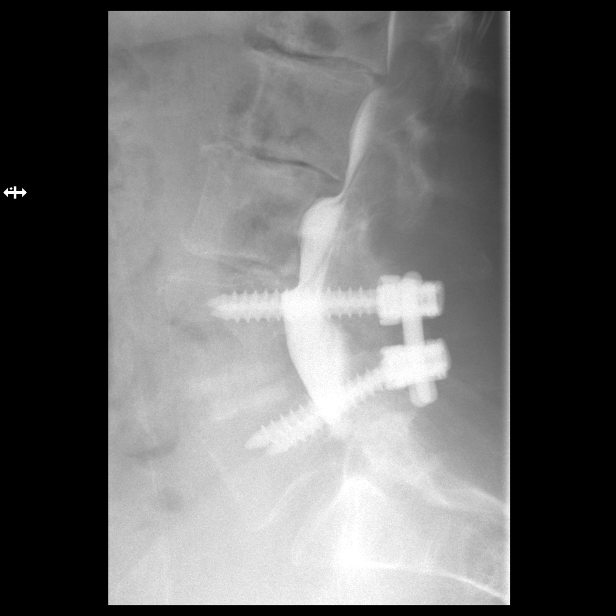

[vasc standard (6 of 9)]
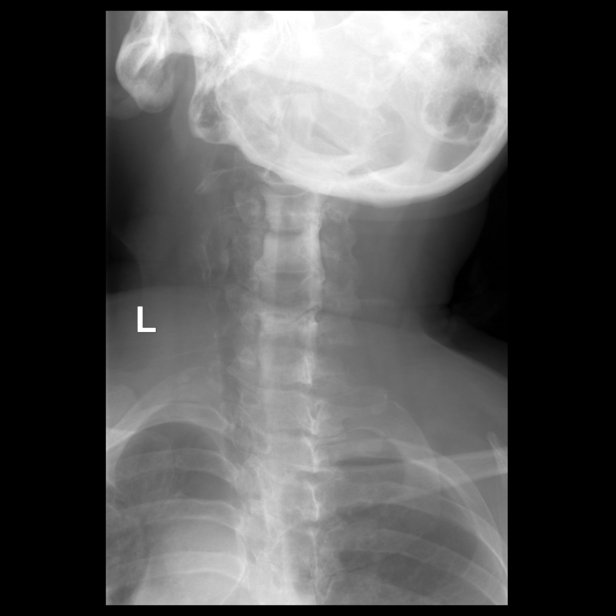

[vasc standard (7 of 9)]
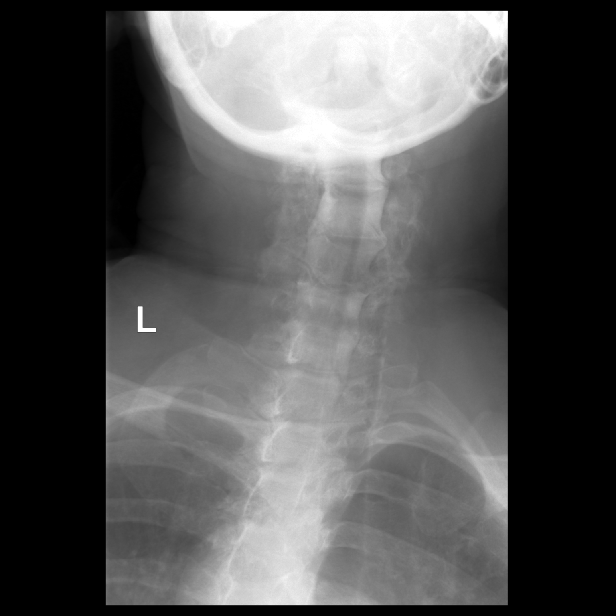

[vasc standard (8 of 9)]
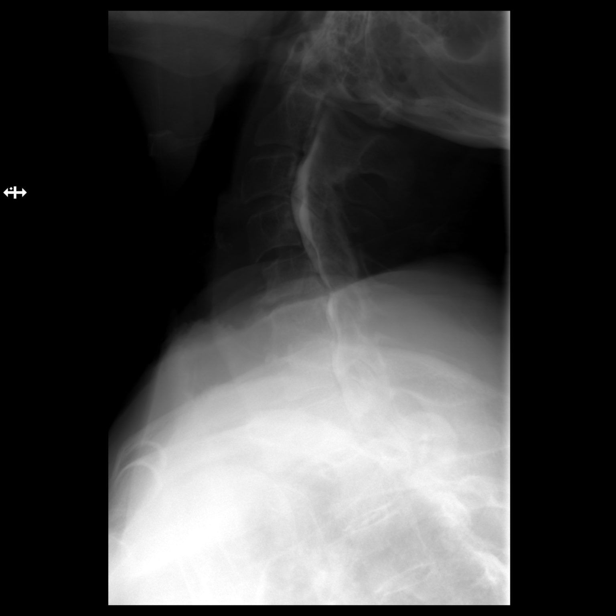

[vasc standard (9 of 9)]
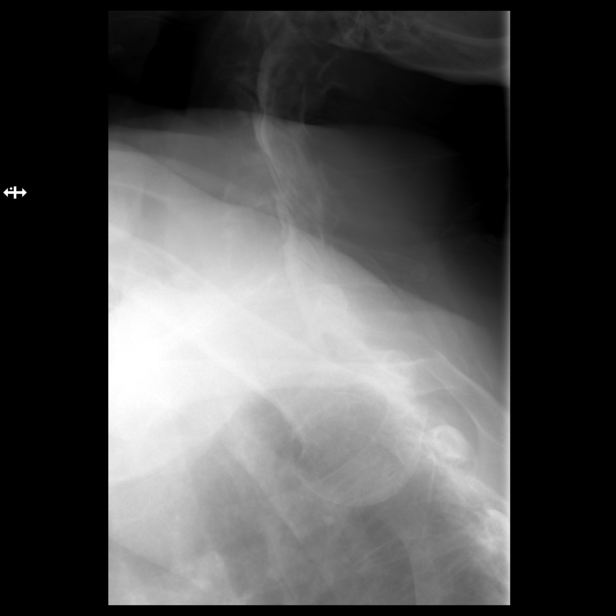

[12 of 24 positions shown; findings below may reference images not displayed]

EXAM:
CERVICAL AND LUMBAR MYELOGRAM

CT CERVICAL MYELOGRAM

CT LUMBAR MYELOGRAM

FLUOROSCOPY:
Radiation Exposure Index (as provided by the fluoroscopic device):
18.9 mGy Kerma

PROCEDURE:
LUMBAR PUNCTURE FOR CERVICAL AND LUMBAR MYELOGRAM

After thorough discussion of risks and benefits of the procedure
including bleeding, infection, injury to nerves, blood vessels,
adjacent structures as well as headache and CSF leak, written and
oral informed consent was obtained. Consent was obtained by Dr.
Sushant Ron.

Patient was positioned prone on the fluoroscopy table. Local
anesthesia was provided with 1% lidocaine without epinephrine after
prepped and draped in the usual sterile fashion. Puncture was
performed at L4-L5 using a 3 1/2 inch 22-gauge spinal needle via
left paramedian approach. Using a single pass through the dura, the
needle was placed within the thecal sac, with return of clear CSF.
10 mL Isovue N-FSS was injected into the thecal sac, with normal
opacification of the nerve roots and cauda equina consistent with
free flow within the subarachnoid space. The patient was then moved
to the trendelenburg position and contrast flowed into the cervical
spine region.

I personally performed the lumbar puncture and administered the
intrathecal contrast. I also personally supervised acquisition of
the myelogram images.
FINDINGS: CERVICAL AND LUMBAR MYELOGRAM FINDINGS:

Cervical spine: Mild reversal of the normal cervical lordosis trace
anterolisthesis at C4-C5. Small ventral extradural defects at C5-C6
and C6-C7. No high-grade spinal canal stenosis. Distortion and
underfilling of the bilateral C6 nerve roots.

Lumbar spine: Prior L4-L5 PLIF. Mild retrolisthesis from L1-L2
through L3-L4. No dynamic instability. Small ventral extradural
defects from L1-L2 through L3-L4. Moderate spinal canal stenosis at
L2-L3. Underfilling of the bilateral L3 nerve roots.

CT CERVICAL MYELOGRAM FINDINGS:

Alignment: Mild reversal of the normal cervical lordosis. Trace
anterolisthesis at C4-C5.

Skull base and vertebrae: No acute fracture or other focal
pathologic process.

Cord: Normal in bulk and morphology.

Soft tissues and upper chest: 3 mm pulmonary nodule in the left lung
apex (series 2, image 81), unchanged since April 2020, benign.
Unchanged bilateral hypodense thyroid nodules measuring up to 7 mm.
This has been evaluated on previous imaging.

Disc levels:

C2-C3: Minimal disc bulging and mild bilateral facet arthropathy. No
stenosis.

C3-C4: Minimal disc bulging. Moderate left and mild right facet
uncovertebral hypertrophy. Moderate to severe left neuroforaminal
stenosis. No spinal canal or right neuroforaminal stenosis.

C4-C5: Tiny central disc protrusion. Mild bilateral uncovertebral
hypertrophy. Ankylosis of the facet joints. Mild left neuroforaminal
stenosis. No spinal canal or right neuroforaminal stenosis.

C5-C6: Small posterior disc osteophyte complex eccentric to the
right with slight flattening of the right ventral cord. Moderate
bilateral uncovertebral hypertrophy. Mild left facet arthropathy.
Mild-to-moderate bilateral neuroforaminal stenosis. No spinal canal
stenosis.

C6-C7: Chronic severe disc height loss with interbody ankylosis.
Mild bilateral uncovertebral hypertrophy. Moderate right and mild
left neuroforaminal stenosis. No spinal canal stenosis.

C7-T1: Negative disc. Mild bilateral facet uncovertebral
hypertrophy. No stenosis.

CT LUMBAR MYELOGRAM FINDINGS:

Segmentation: Standard.

Alignment: 2 mm retrolisthesis at L1-L2. 5 mm retrolisthesis at
L2-L3. 3 mm retrolisthesis at L3-L4. These are unchanged since 0303.

Vertebrae: No acute fracture or other focal pathologic process.
Prior L4-L5 PLIF with solid osseous fusion. No evidence of hardware
failure or loosening.

Conus medullaris and cauda equina: Conus extends to the L2 level.
Conus and cauda equina appear normal.

Paraspinal and other soft tissues: Negative.

Disc levels:

T12-L1: New minimal disc bulging and mild right facet arthropathy.
No stenosis.

L1-L2:  Unchanged mild disc bulging.  No stenosis.

L2-L3: Unchanged retrolisthesis and diffuse disc bulging. Unchanged
moderate spinal canal and bilateral lateral recess stenosis. No
neuroforaminal stenosis.

L3-L4: Unchanged retrolisthesis and mild diffuse disc bulging.
Unchanged moderate bilateral facet arthropathy. Unchanged moderate
to severe bilateral neuroforaminal stenosis. No spinal canal
stenosis.

L4-L5: Prior posterior decompression and PLIF. No residual stenosis.

L5-S1: Unchanged small broad-based posterior disc protrusion.
Progressive severe bilateral facet arthropathy. New mild right
neuroforaminal stenosis. No spinal canal or left neuroforaminal
stenosis.
IMPRESSION: CERVICAL SPINE:

1. Multilevel cervical spondylosis as described above. Moderate to
severe left neuroforaminal stenosis at C3-C4. No high-grade spinal
canal stenosis.

LUMBAR SPINE:

1. Unchanged moderate spinal canal stenosis at L2-L3.
2. Unchanged moderate to severe bilateral neuroforaminal stenosis at
L3-L4.
3. Prior L4-L5 PLIF without hardware complication.
4. Progressive severe bilateral facet arthropathy at L5-S1.

## 2023-11-06 ENCOUNTER — Ambulatory Visit: Payer: Medicare Other | Admitting: Nurse Practitioner

## 2023-11-11 DIAGNOSIS — B259 Cytomegaloviral disease, unspecified: Secondary | ICD-10-CM | POA: Diagnosis not present

## 2023-11-11 DIAGNOSIS — A0839 Other viral enteritis: Secondary | ICD-10-CM | POA: Diagnosis not present

## 2023-11-11 DIAGNOSIS — K50819 Crohn's disease of both small and large intestine with unspecified complications: Secondary | ICD-10-CM | POA: Diagnosis not present

## 2023-11-13 ENCOUNTER — Ambulatory Visit (INDEPENDENT_AMBULATORY_CARE_PROVIDER_SITE_OTHER): Admitting: Nurse Practitioner

## 2023-11-13 VITALS — BP 110/72 | HR 78 | Temp 98.3°F | Ht 60.0 in | Wt 139.4 lb

## 2023-11-13 DIAGNOSIS — K5010 Crohn's disease of large intestine without complications: Secondary | ICD-10-CM | POA: Diagnosis not present

## 2023-11-13 NOTE — Progress Notes (Signed)
 New Patient Office Visit  Subjective    Patient ID: [name], female    DOB: Feb 23, 1941  Age: 83 y.o. MRN: [id number]  CC:  Chief Complaint  Patient presents with   Crohn's Disease    HPI [name] presents to establish care She is appearing to undergo laparoscopic colectomy at [facility] due to Crohn's disease.  She plans on being treated at rehab facility after surgery to get proper physical therapy and recover.  Eventually she will return back to her home where she lives at [facility] [place] in the independent living side.  Overall today she is feeling well.   Outpatient Encounter Medications as of 11/13/2023  Medication Sig   acetaminophen (TYLENOL) 325 MG tablet Take 650 mg by mouth 2 (two) times daily as needed for mild pain or headache.   ALPRAZolam (XANAX) 0.5 MG tablet Take 0.5 mg by mouth at bedtime. Pt takes medication twice at bed time   Carboxymethylcellulose Sodium (EYE DROPS OP) Place 2 drops into both eyes in the morning and at bedtime.   cyanocobalamin 2000 MCG tablet Take 2,000 mcg by mouth daily.   DULoxetine (CYMBALTA) 60 MG capsule Take 60 mg by mouth daily.   famotidine (PEPCID) 20 MG tablet Take 1 tablet (20 mg total) by mouth daily.   iron polysaccharides (NIFEREX) 150 MG capsule Take 150 mg by mouth in the morning, at noon, and at bedtime.   levothyroxine (SYNTHROID) 88 MCG tablet Take 88 mcg by mouth every morning.   metoprolol succinate (TOPROL-XL) 25 MG 24 hr tablet Take 0.5 tablets (12.5 mg total) by mouth daily.   Multiple Vitamins-Iron (MULTIVITAMIN/IRON PO) Take 1 tablet by mouth 2 (two) times daily.   ondansetron (ZOFRAN-ODT) 4 MG disintegrating tablet Take 1 tablet (4 mg total) by mouth every 8 (eight) hours as needed for nausea or vomiting. Please keep your Sept appointment for further refills. Thanks.   predniSONE (DELTASONE) 10 MG tablet Take 1 tablet by mouth daily.   rosuvastatin (CRESTOR) 10 MG tablet Take 1 tablet (10 mg total) by mouth every  evening.   valGANciclovir (VALCYTE) 450 MG tablet Take 450 mg by mouth 2 (two) times daily.   VITAMIN D PO Take 1 capsule by mouth daily.   furosemide (LASIX) 20 MG tablet Take 1 tablet (20 mg total) by mouth daily.   [DISCONTINUED] polyethylene glycol (MIRALAX) 17 g packet Take 17 g by mouth daily.   No facility-administered encounter medications on file as of 11/13/2023.    Past Medical History:  Diagnosis Date   C. difficile colitis    Cancer (HCC) 2014   kidney   COVID    Crohn's disease (HCC) 04/17/2023   Esophageal stricture    Fibromyalgia    GERD (gastroesophageal reflux disease)    Helicobacter pylori gastritis    Hypertension    Intention tremor    Lumbago    Memory loss    Osteoarthritis of spine    Pneumonia    hx of years ago    PUD (peptic ulcer disease)    PVD (peripheral vascular disease) (HCC)     Past Surgical History:  Procedure Laterality Date   ABDOMINAL HYSTERECTOMY  1985   BIOPSY  08/30/2022   Procedure: BIOPSY;  Surgeon: Benancio Deeds, MD;  Location: [facility] ENDOSCOPY;  Service: Gastroenterology;;   BREAST ENHANCEMENT SURGERY  1974   CATARACT EXTRACTION Bilateral 2014   shapiro   COLONOSCOPY WITH PROPOFOL N/A 08/30/2022   Procedure: COLONOSCOPY WITH PROPOFOL;  Surgeon: Benancio Deeds, MD;  [facility];  Service: Gastroenterology;  Laterality: N/A;   LAPAROSCOPIC PARTIAL NEPHRECTOMY Left April '13   [facility], Dr. Laverle Patter   LAPAROTOMY N/A 07/05/2022   Procedure: EXPLORATORY LAPAROTOMY;  Surgeon: Andria Meuse, MD;  Location: [facility] OR;  Service: General;  Laterality: N/A;   LUMBAR FUSION  03/2003   L4-5   TONSILLECTOMY  1958   TUBAL LIGATION  1984    Family History  Problem Relation Age of Onset   Heart disease Mother    COPD Mother    Thyroid disease Mother    Diabetes Father    Heart disease Father        CABG   Stroke Father    Parkinson's disease Father    Heart disease Brother    Stroke Brother    Hyperlipidemia  Brother    Diabetes Daughter    Hearing loss Brother    Crohn's disease Other        neice   Hypertension Son    Diabetes Son    Melanoma Son    Colon cancer Neg Hx    Stomach cancer Neg Hx    Esophageal cancer Neg Hx     Social History   Socioeconomic History   Marital status: Widowed    Spouse name: Not on file   Number of children: 2   Years of education: BA   Highest education level: Bachelor's degree (e.g., BA, AB, BS)  Occupational History   Occupation: retiredPsychologist, sport and exercise of [employer]   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  Tobacco Use   Smoking status: Never   Smokeless tobacco: Never  Vaping Use   Vaping status: Never Used  Substance and Sexual Activity   Alcohol use: Not Currently    Alcohol/week: 7.0 standard drinks of alcohol    Types: 7 Glasses of wine per week    Comment: Patient no longer drinks alcohol due to her Crohn's disease   Drug use: No   Sexual activity: Not Currently  Other Topics Concern   Not on file  Social History Narrative   Lives alone   Right Handed   Drinks 2 cups coffee daily   Social Drivers of Health   Financial Resource Strain: Low Risk  (11/03/2023)   Overall Financial Resource Strain (CARDIA)    Difficulty of Paying Living Expenses: Not hard at all  Food Insecurity: No Food Insecurity (11/03/2023)   Hunger Vital Sign    Worried About Running Out of Food in the Last Year: Never true    Ran Out of Food in the Last Year: Never true  Transportation Needs: No Transportation Needs (11/03/2023)   PRAPARE - Administrator, Civil Service (Medical): No    Lack of Transportation (Non-Medical): No  Physical Activity: Sufficiently Active (11/03/2023)   Exercise Vital Sign    Days of Exercise per Week: 5 days    Minutes of Exercise per Session: 30 min  Stress: No Stress Concern Present (11/03/2023)   [employer] of Occupational Health - Occupational Stress Questionnaire    Feeling of Stress : Not at all  Social  Connections: Moderately Isolated (11/03/2023)   Social Connection and Isolation Panel [NHANES]    Frequency of Communication with Friends and Family: More than three times a week    Frequency of Social Gatherings with Friends and Family: More than three times a week    Attends Religious Services: Never    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  Constitutional:      General: She is not in acute distress.    Appearance: Normal appearance.  HENT:     Head: Normocephalic and atraumatic.  Neck:     Vascular: No carotid bruit.  Cardiovascular:     Rate and Rhythm: Normal rate and regular rhythm.     Pulses: Normal pulses.     Heart sounds: Normal heart sounds.  Pulmonary:     Effort: Pulmonary effort is normal.     Breath sounds: Normal breath sounds.  Skin:    General: Skin is warm and dry.  Neurological:     General: No focal deficit present.     Mental Status: She is alert and oriented to person, place, and time.  Psychiatric:        Mood and Affect: Mood normal.        Behavior: Behavior normal.        Judgment: Judgment normal.         Assessment & Plan:   Problem List Items Addressed This Visit       Digestive   Crohn's colitis [facility]) - Primary   Chronic Patient to follow-up with gastroenterologist and surgeon at [facility] for planned surgery in 2 weeks. Encouraged patient to follow-up at this office after she has returned home from rehab facility. Patient  encouraged to reach out with any questions or concerns between now and then.       Return in about 3 months (around 02/12/2024), or 3 month f/u with Deliah Boston or Worthy Rancher then 6 month f/u with Maralyn Sago.   Elenore Paddy, NP

## 2023-11-13 NOTE — Assessment & Plan Note (Addendum)
 Chronic Patient to follow-up with gastroenterologist and surgeon at [facility] for planned surgery in 2 weeks. Encouraged patient to follow-up at this office after she has returned home from rehab facility. Patient encouraged to reach out with any questions or concerns between now and then.

## 2023-11-24 ENCOUNTER — Telehealth: Payer: Self-pay | Admitting: Nurse Practitioner

## 2023-11-24 NOTE — Telephone Encounter (Signed)
 Medication added to pt list. ?

## 2023-11-24 NOTE — Telephone Encounter (Signed)
 Copied from CRM [id number]. Topic: Clinical - Medication Question >> Nov 24, 2023  9:14 AM Carlatta H wrote: Reason for CRM: Patient needs to have 10mg  of melatonin added to her medication list//She is taking that every night and wont get it if its not added to her list//

## 2023-11-25 ENCOUNTER — Encounter: Payer: Self-pay | Admitting: Physician Assistant

## 2023-11-25 ENCOUNTER — Ambulatory Visit (HOSPITAL_COMMUNITY): Payer: Medicare Other | Attending: Cardiology

## 2023-11-25 DIAGNOSIS — I272 Pulmonary hypertension, unspecified: Secondary | ICD-10-CM | POA: Insufficient documentation

## 2023-11-25 DIAGNOSIS — I340 Nonrheumatic mitral (valve) insufficiency: Secondary | ICD-10-CM | POA: Insufficient documentation

## 2023-11-25 HISTORY — DX: Nonrheumatic mitral (valve) insufficiency: I34.0

## 2023-11-25 LAB — ECHOCARDIOGRAM COMPLETE
Area-P 1/2: 3.94 cm2
S' Lateral: 2.7 cm

## 2023-11-28 DIAGNOSIS — Z85528 Personal history of other malignant neoplasm of kidney: Secondary | ICD-10-CM | POA: Diagnosis not present

## 2023-11-28 DIAGNOSIS — B258 Other cytomegaloviral diseases: Secondary | ICD-10-CM | POA: Diagnosis not present

## 2023-11-28 DIAGNOSIS — I872 Venous insufficiency (chronic) (peripheral): Secondary | ICD-10-CM | POA: Diagnosis not present

## 2023-11-28 DIAGNOSIS — Z905 Acquired absence of kidney: Secondary | ICD-10-CM | POA: Diagnosis not present

## 2023-11-28 DIAGNOSIS — I10 Essential (primary) hypertension: Secondary | ICD-10-CM | POA: Diagnosis not present

## 2023-11-28 DIAGNOSIS — Z79899 Other long term (current) drug therapy: Secondary | ICD-10-CM | POA: Diagnosis not present

## 2023-11-28 DIAGNOSIS — I2510 Atherosclerotic heart disease of native coronary artery without angina pectoris: Secondary | ICD-10-CM | POA: Diagnosis not present

## 2023-11-28 DIAGNOSIS — Z8673 Personal history of transient ischemic attack (TIA), and cerebral infarction without residual deficits: Secondary | ICD-10-CM | POA: Diagnosis not present

## 2023-11-28 DIAGNOSIS — R488 Other symbolic dysfunctions: Secondary | ICD-10-CM | POA: Diagnosis not present

## 2023-11-28 DIAGNOSIS — I440 Atrioventricular block, first degree: Secondary | ICD-10-CM | POA: Diagnosis not present

## 2023-11-28 DIAGNOSIS — K51912 Ulcerative colitis, unspecified with intestinal obstruction: Secondary | ICD-10-CM | POA: Diagnosis not present

## 2023-11-28 DIAGNOSIS — E039 Hypothyroidism, unspecified: Secondary | ICD-10-CM | POA: Diagnosis not present

## 2023-11-28 DIAGNOSIS — D6859 Other primary thrombophilia: Secondary | ICD-10-CM | POA: Diagnosis not present

## 2023-11-28 DIAGNOSIS — K279 Peptic ulcer, site unspecified, unspecified as acute or chronic, without hemorrhage or perforation: Secondary | ICD-10-CM | POA: Diagnosis not present

## 2023-11-28 DIAGNOSIS — G8918 Other acute postprocedural pain: Secondary | ICD-10-CM | POA: Diagnosis not present

## 2023-11-28 DIAGNOSIS — K219 Gastro-esophageal reflux disease without esophagitis: Secondary | ICD-10-CM | POA: Diagnosis not present

## 2023-11-28 DIAGNOSIS — A048 Other specified bacterial intestinal infections: Secondary | ICD-10-CM | POA: Diagnosis not present

## 2023-11-28 DIAGNOSIS — K5190 Ulcerative colitis, unspecified, without complications: Secondary | ICD-10-CM | POA: Diagnosis not present

## 2023-11-28 DIAGNOSIS — K50819 Crohn's disease of both small and large intestine with unspecified complications: Secondary | ICD-10-CM | POA: Diagnosis not present

## 2023-11-28 DIAGNOSIS — I272 Pulmonary hypertension, unspecified: Secondary | ICD-10-CM | POA: Diagnosis not present

## 2023-12-03 ENCOUNTER — Telehealth: Payer: Self-pay | Admitting: *Deleted

## 2023-12-03 NOTE — Transitions of Care (Post Inpatient/ED Visit) (Signed)
   12/03/2023  Name: [name] MRN: [id number] DOB: Jan 06, 1941  Today's TOC FU Call Status: Today's TOC FU Call Status:: Unsuccessful Call (1st Attempt) Unsuccessful Call (1st Attempt) Date: 12/03/23  Attempted to reach the patient regarding the most recent Inpatient visit.  Left HIPAA compliant voice message requesting call back  Follow Up Plan: Additional outreach attempts will be made to reach the patient to complete the Transitions of Care (Post Inpatient visit) call.   Pls call/ message for questions,  Tarhonda Hollenberg Mckinney Melquan Ernsberger, RN, BSN, CCRN Alumnus RN Care Manager  Transitions of Care  [facility] [phone]: direct office

## 2023-12-03 NOTE — Transitions of Care (Post Inpatient/ED Visit) (Signed)
 12/03/2023  Name: [name] MRN: [id number] DOB: 10-13-1940  Today's TOC FU Call Status: Today's TOC FU Call Status:: Successful TOC FU Call Completed TOC FU Call Complete Date: 12/03/23 Patient's Name and Date of Birth confirmed.  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 How have you been since you were released from the hospital?: Better ("Things are going amazingly well- everything is llined up with the nnurse practicioner to and PT to start coming out through [employer]; I saw my PCP before the surgery and she told me to come back in 3 months unless I had problems pop up") Any questions or concerns?: No  Items Reviewed: Did you receive and understand the discharge instructions provided?: Yes (thoroughly reviewed with patient who verbalizes good understanding of same - outside hospital AVS) Medications obtained,verified, and reconciled?: No (reportsgets blister packs from [facility]: reports "the pharmacy called me earlier today and went over all of my medicines; I don't want to go over all of that again- I understand everything they have prescribed") Medications Not Reviewed Reasons:: Other: (patient adamantly declines medication review today: reports obtained/ is taking all newly Rx'd medications as instructed; self-manages medications / denies questions/ concerns around medications today) Any new allergies since your discharge?: No Dietary orders reviewed?: Yes Type of Diet Ordered:: "Regular" Do you have support at home?: Yes People in Home [RPT]: alone Name of Support/Comfort Primary Source: Reports independent in self-care activities; resides in [facility] at [facility]- facility staff  assists as/ if needed/ indicated; also has local daughter that assists as/ if indicated  Medications Reviewed Today: Medications Reviewed Today     Reviewed by Keashia Haskins M, RN (Registered Nurse) on 12/03/23 at 1455  Med List Status: <None>   Medication Order Taking? Sig Documenting Provider Last Dose Status Informant  acetaminophen  (TYLENOL ) 325 MG tablet [id number] No Take 650 mg by mouth 2 (two) times daily as needed for mild pain or headache. [provider] Taking Active Self  ALPRAZolam  (XANAX ) 0.5 MG tablet [id number] No Take 0.5 mg by mouth at bedtime. Pt takes medication twice at bed time [provider] Taking Active   Carboxymethylcellulose Sodium (EYE DROPS OP) [id number] No Place 2 drops into both eyes in the morning and at bedtime. [provider] Taking Active Self           Med Note Sharion Davidson, MICHELE   Wed Sep 03, 2023 10:33 AM)    cyanocobalamin 2000 MCG tablet [id number] No Take 2,000 mcg by mouth daily. [provider] Taking Active   DULoxetine  (CYMBALTA ) 60 MG capsule [id number] No Take 60 mg by mouth daily. [provider] Taking Active Self  famotidine  (PEPCID ) 20 MG tablet [id number] No Take 1 tablet (20 mg total) by mouth daily. Ace Holder, MD Taking Active Self  furosemide  (LASIX ) 20 MG tablet [id number] No Take 1 tablet (20 mg total) by mouth daily. Custovic, Sabina, DO Taking Expired 09/03/23 2359 Self  iron polysaccharides (NIFEREX) 150 MG capsule [id number] No Take 150 mg by mouth in the morning, at noon, and at bedtime. [provider] Taking Active Self  levothyroxine  (SYNTHROID ) 88 MCG tablet [id number] No Take 88 mcg by mouth every morning. [provider] Taking Active Self  Melatonin 10 MG TABS [id number] No Take by mouth. [provider] Taking Active   metoprolol  succinate (TOPROL -XL) 25 MG 24 hr tablet [id number] No Take 0.5 tablets (12.5 mg total) by mouth daily. Jann Melody, MD Taking Active   Multiple Vitamins-Iron (MULTIVITAMIN/IRON PO) [id number] No Take 1 tablet by mouth 2 (two) times daily. [provider] Taking Active Self  ondansetron  (ZOFRAN -ODT) 4 MG disintegrating tablet [id number] No Take 1 tablet (4 mg total) by mouth every 8 (eight) hours as needed for nausea or vomiting. Please keep your Sept appointment for further refills. Thanks. Ace Holder, MD Taking Active Self           Med Note Sharion Davidson, MICHELE   Wed Sep 03, 2023 10:33 AM)    predniSONE  (DELTASONE ) 10 MG tablet [id number] No Take 1 tablet by mouth daily. [provider] Taking Active   rosuvastatin  (CRESTOR ) 10 MG tablet [id number] No Take 1 tablet (10 mg total) by mouth every evening. Sherwood Donath Taking Active   VITAMIN D  PO [id number] No Take 1 capsule by mouth daily. [provider] Taking Active Self  Med List Note Bobbi Burow, Rayma Calandra, CPhT 10/18/22 1103): [facility] [phone]           Home Care and Equipment/Supplies: Were Home Health Services Ordered?: Yes Name of Home Health Agency:: "[facility]- PT and a nurse practicioner will be starting next week, it's all lined up" Has Agency set up a time to come to your home?: Yes First Home Health Visit Date: 12/08/23 Any new equipment or medical supplies ordered?: Yes (ostomy supplies) Name of Medical supply agency?: "[facility]" Were you able to get the equipment/medical supplies?: Yes Do you have any questions related to the use of the equipment/supplies?: No  Functional Questionnaire: Do you need assistance with bathing/showering or dressing?: No Do you need assistance with meal preparation?: No Do you need assistance with eating?: No Do you have difficulty maintaining continence: No Do you need assistance with getting out of bed/getting out of a chair/moving?: No Do you have difficulty managing or taking your medications?:  No  Follow up appointments reviewed: PCP Follow-up appointment confirmed?: NA (verified not indicated per hospital discharging provider discharge notes: patient reports that she saw PCP prior to planned surgery and PCP advised no hospital follow up needed for post- discharge from planned surgery) Specialist Hospital Follow-up appointment confirmed?: Yes Date of Specialist follow-up appointment?: 12/29/23 (verified this is recommended time frame for follow up per hospital discharging provider notes) Follow-Up Specialty Provider:: [facility] surgical provider Do you need transportation to your follow-up appointment?: No Do you understand care options if your condition(s) worsen?: Yes-patient verbalized understanding  SDOH Interventions Today    Flowsheet Row Most Recent Value  SDOH Interventions   Food Insecurity Interventions Intervention Not Indicated  Housing Interventions Intervention Not Indicated  Transportation Interventions Intervention Not Indicated  [ILF or local daughter provides all transportation]  Utilities Interventions Intervention Not Indicated      See TOC assessment tabs for additional assessment/ TOC intervention information  Total time spent from review to signing of note/ including any care coordination interventions:  54 minutes  Pls call/ message for questions,  Rainey Rodger Mckinney Margorie Renner, RN, BSN, Media planner  Transitions of Care  [facility] - [facility] [phone]: direct office

## 2023-12-04 ENCOUNTER — Telehealth: Payer: Self-pay | Admitting: Internal Medicine

## 2023-12-04 MED ORDER — FUROSEMIDE 20 MG PO TABS: 20.0000 mg | ORAL_TABLET | Freq: Every day | ORAL | 2 refills | Status: DC

## 2023-12-04 NOTE — Telephone Encounter (Signed)
*  STAT* If patient is at the pharmacy, call can be transferred to refill team.   1. Which medications need to be refilled? (please list name of each medication and dose if known)   furosemide  (LASIX ) 20 MG tablet (Expired)   2. Would you like to learn more about the convenience, safety, & potential cost savings by using the [facility]?   3. Are you open to using the [facility] (Type [facility]. ).  4. Which pharmacy/location (including street and city if local pharmacy) is medication to be sent to?  [facility] [place], [place] - [address]   5. Do they need a 30 day or 90 day supply?   30 day  Caller Jenette Mitchell) stated patient will be out of this medication tomorrow.

## 2023-12-04 NOTE — Telephone Encounter (Signed)
 Pt's medication was sent to pt's pharmacy as requested. Confirmation received.

## 2023-12-05 DIAGNOSIS — Z4889 Encounter for other specified surgical aftercare: Secondary | ICD-10-CM | POA: Diagnosis not present

## 2023-12-05 DIAGNOSIS — Z8673 Personal history of transient ischemic attack (TIA), and cerebral infarction without residual deficits: Secondary | ICD-10-CM | POA: Diagnosis not present

## 2023-12-05 DIAGNOSIS — I872 Venous insufficiency (chronic) (peripheral): Secondary | ICD-10-CM | POA: Diagnosis not present

## 2023-12-05 DIAGNOSIS — Z85828 Personal history of other malignant neoplasm of skin: Secondary | ICD-10-CM | POA: Diagnosis not present

## 2023-12-05 DIAGNOSIS — E039 Hypothyroidism, unspecified: Secondary | ICD-10-CM | POA: Diagnosis not present

## 2023-12-05 DIAGNOSIS — K50819 Crohn's disease of both small and large intestine with unspecified complications: Secondary | ICD-10-CM | POA: Diagnosis not present

## 2023-12-05 DIAGNOSIS — Z432 Encounter for attention to ileostomy: Secondary | ICD-10-CM | POA: Diagnosis not present

## 2023-12-05 DIAGNOSIS — Z48815 Encounter for surgical aftercare following surgery on the digestive system: Secondary | ICD-10-CM | POA: Diagnosis not present

## 2023-12-05 DIAGNOSIS — I440 Atrioventricular block, first degree: Secondary | ICD-10-CM | POA: Diagnosis not present

## 2023-12-05 DIAGNOSIS — K279 Peptic ulcer, site unspecified, unspecified as acute or chronic, without hemorrhage or perforation: Secondary | ICD-10-CM | POA: Diagnosis not present

## 2023-12-12 DIAGNOSIS — K50819 Crohn's disease of both small and large intestine with unspecified complications: Secondary | ICD-10-CM | POA: Diagnosis not present

## 2023-12-12 DIAGNOSIS — Z4889 Encounter for other specified surgical aftercare: Secondary | ICD-10-CM | POA: Diagnosis not present

## 2023-12-12 DIAGNOSIS — Z85828 Personal history of other malignant neoplasm of skin: Secondary | ICD-10-CM | POA: Diagnosis not present

## 2023-12-12 DIAGNOSIS — Z48815 Encounter for surgical aftercare following surgery on the digestive system: Secondary | ICD-10-CM | POA: Diagnosis not present

## 2023-12-12 DIAGNOSIS — E039 Hypothyroidism, unspecified: Secondary | ICD-10-CM | POA: Diagnosis not present

## 2023-12-12 DIAGNOSIS — Z8673 Personal history of transient ischemic attack (TIA), and cerebral infarction without residual deficits: Secondary | ICD-10-CM | POA: Diagnosis not present

## 2023-12-12 DIAGNOSIS — I872 Venous insufficiency (chronic) (peripheral): Secondary | ICD-10-CM | POA: Diagnosis not present

## 2023-12-12 DIAGNOSIS — K279 Peptic ulcer, site unspecified, unspecified as acute or chronic, without hemorrhage or perforation: Secondary | ICD-10-CM | POA: Diagnosis not present

## 2023-12-12 DIAGNOSIS — I440 Atrioventricular block, first degree: Secondary | ICD-10-CM | POA: Diagnosis not present

## 2023-12-12 DIAGNOSIS — Z432 Encounter for attention to ileostomy: Secondary | ICD-10-CM | POA: Diagnosis not present

## 2023-12-18 DIAGNOSIS — Z4889 Encounter for other specified surgical aftercare: Secondary | ICD-10-CM | POA: Diagnosis not present

## 2023-12-18 DIAGNOSIS — K279 Peptic ulcer, site unspecified, unspecified as acute or chronic, without hemorrhage or perforation: Secondary | ICD-10-CM | POA: Diagnosis not present

## 2023-12-18 DIAGNOSIS — K50819 Crohn's disease of both small and large intestine with unspecified complications: Secondary | ICD-10-CM | POA: Diagnosis not present

## 2023-12-18 DIAGNOSIS — I872 Venous insufficiency (chronic) (peripheral): Secondary | ICD-10-CM | POA: Diagnosis not present

## 2023-12-18 DIAGNOSIS — Z85828 Personal history of other malignant neoplasm of skin: Secondary | ICD-10-CM | POA: Diagnosis not present

## 2023-12-18 DIAGNOSIS — I440 Atrioventricular block, first degree: Secondary | ICD-10-CM | POA: Diagnosis not present

## 2023-12-18 DIAGNOSIS — E039 Hypothyroidism, unspecified: Secondary | ICD-10-CM | POA: Diagnosis not present

## 2023-12-18 DIAGNOSIS — Z48815 Encounter for surgical aftercare following surgery on the digestive system: Secondary | ICD-10-CM | POA: Diagnosis not present

## 2023-12-18 DIAGNOSIS — Z432 Encounter for attention to ileostomy: Secondary | ICD-10-CM | POA: Diagnosis not present

## 2023-12-18 DIAGNOSIS — Z8673 Personal history of transient ischemic attack (TIA), and cerebral infarction without residual deficits: Secondary | ICD-10-CM | POA: Diagnosis not present

## 2023-12-22 DIAGNOSIS — Z48815 Encounter for surgical aftercare following surgery on the digestive system: Secondary | ICD-10-CM | POA: Diagnosis not present

## 2023-12-22 DIAGNOSIS — I440 Atrioventricular block, first degree: Secondary | ICD-10-CM | POA: Diagnosis not present

## 2023-12-22 DIAGNOSIS — K50819 Crohn's disease of both small and large intestine with unspecified complications: Secondary | ICD-10-CM | POA: Diagnosis not present

## 2023-12-22 DIAGNOSIS — E039 Hypothyroidism, unspecified: Secondary | ICD-10-CM | POA: Diagnosis not present

## 2023-12-22 DIAGNOSIS — K279 Peptic ulcer, site unspecified, unspecified as acute or chronic, without hemorrhage or perforation: Secondary | ICD-10-CM | POA: Diagnosis not present

## 2023-12-22 DIAGNOSIS — Z85828 Personal history of other malignant neoplasm of skin: Secondary | ICD-10-CM | POA: Diagnosis not present

## 2023-12-22 DIAGNOSIS — Z432 Encounter for attention to ileostomy: Secondary | ICD-10-CM | POA: Diagnosis not present

## 2023-12-22 DIAGNOSIS — Z4889 Encounter for other specified surgical aftercare: Secondary | ICD-10-CM | POA: Diagnosis not present

## 2023-12-22 DIAGNOSIS — Z8673 Personal history of transient ischemic attack (TIA), and cerebral infarction without residual deficits: Secondary | ICD-10-CM | POA: Diagnosis not present

## 2023-12-22 DIAGNOSIS — I872 Venous insufficiency (chronic) (peripheral): Secondary | ICD-10-CM | POA: Diagnosis not present

## 2023-12-23 DIAGNOSIS — Z85828 Personal history of other malignant neoplasm of skin: Secondary | ICD-10-CM | POA: Diagnosis not present

## 2023-12-23 DIAGNOSIS — I872 Venous insufficiency (chronic) (peripheral): Secondary | ICD-10-CM | POA: Diagnosis not present

## 2023-12-23 DIAGNOSIS — K50819 Crohn's disease of both small and large intestine with unspecified complications: Secondary | ICD-10-CM | POA: Diagnosis not present

## 2023-12-23 DIAGNOSIS — Z48815 Encounter for surgical aftercare following surgery on the digestive system: Secondary | ICD-10-CM | POA: Diagnosis not present

## 2023-12-23 DIAGNOSIS — K279 Peptic ulcer, site unspecified, unspecified as acute or chronic, without hemorrhage or perforation: Secondary | ICD-10-CM | POA: Diagnosis not present

## 2023-12-23 DIAGNOSIS — Z432 Encounter for attention to ileostomy: Secondary | ICD-10-CM | POA: Diagnosis not present

## 2023-12-23 DIAGNOSIS — I440 Atrioventricular block, first degree: Secondary | ICD-10-CM | POA: Diagnosis not present

## 2023-12-23 DIAGNOSIS — Z8673 Personal history of transient ischemic attack (TIA), and cerebral infarction without residual deficits: Secondary | ICD-10-CM | POA: Diagnosis not present

## 2023-12-23 DIAGNOSIS — Z4889 Encounter for other specified surgical aftercare: Secondary | ICD-10-CM | POA: Diagnosis not present

## 2023-12-23 DIAGNOSIS — E039 Hypothyroidism, unspecified: Secondary | ICD-10-CM | POA: Diagnosis not present

## 2023-12-25 DIAGNOSIS — I440 Atrioventricular block, first degree: Secondary | ICD-10-CM | POA: Diagnosis not present

## 2023-12-25 DIAGNOSIS — Z8673 Personal history of transient ischemic attack (TIA), and cerebral infarction without residual deficits: Secondary | ICD-10-CM | POA: Diagnosis not present

## 2023-12-25 DIAGNOSIS — K50819 Crohn's disease of both small and large intestine with unspecified complications: Secondary | ICD-10-CM | POA: Diagnosis not present

## 2023-12-25 DIAGNOSIS — Z48815 Encounter for surgical aftercare following surgery on the digestive system: Secondary | ICD-10-CM | POA: Diagnosis not present

## 2023-12-25 DIAGNOSIS — I872 Venous insufficiency (chronic) (peripheral): Secondary | ICD-10-CM | POA: Diagnosis not present

## 2023-12-25 DIAGNOSIS — Z432 Encounter for attention to ileostomy: Secondary | ICD-10-CM | POA: Diagnosis not present

## 2023-12-25 DIAGNOSIS — K279 Peptic ulcer, site unspecified, unspecified as acute or chronic, without hemorrhage or perforation: Secondary | ICD-10-CM | POA: Diagnosis not present

## 2023-12-25 DIAGNOSIS — E039 Hypothyroidism, unspecified: Secondary | ICD-10-CM | POA: Diagnosis not present

## 2023-12-25 DIAGNOSIS — Z4889 Encounter for other specified surgical aftercare: Secondary | ICD-10-CM | POA: Diagnosis not present

## 2023-12-25 DIAGNOSIS — Z85828 Personal history of other malignant neoplasm of skin: Secondary | ICD-10-CM | POA: Diagnosis not present

## 2023-12-26 DIAGNOSIS — I872 Venous insufficiency (chronic) (peripheral): Secondary | ICD-10-CM | POA: Diagnosis not present

## 2023-12-26 DIAGNOSIS — Z85828 Personal history of other malignant neoplasm of skin: Secondary | ICD-10-CM | POA: Diagnosis not present

## 2023-12-26 DIAGNOSIS — I440 Atrioventricular block, first degree: Secondary | ICD-10-CM | POA: Diagnosis not present

## 2023-12-26 DIAGNOSIS — Z432 Encounter for attention to ileostomy: Secondary | ICD-10-CM | POA: Diagnosis not present

## 2023-12-26 DIAGNOSIS — Z48815 Encounter for surgical aftercare following surgery on the digestive system: Secondary | ICD-10-CM | POA: Diagnosis not present

## 2023-12-26 DIAGNOSIS — K50819 Crohn's disease of both small and large intestine with unspecified complications: Secondary | ICD-10-CM | POA: Diagnosis not present

## 2023-12-26 DIAGNOSIS — K279 Peptic ulcer, site unspecified, unspecified as acute or chronic, without hemorrhage or perforation: Secondary | ICD-10-CM | POA: Diagnosis not present

## 2023-12-26 DIAGNOSIS — E039 Hypothyroidism, unspecified: Secondary | ICD-10-CM | POA: Diagnosis not present

## 2023-12-26 DIAGNOSIS — Z8673 Personal history of transient ischemic attack (TIA), and cerebral infarction without residual deficits: Secondary | ICD-10-CM | POA: Diagnosis not present

## 2023-12-26 DIAGNOSIS — Z4889 Encounter for other specified surgical aftercare: Secondary | ICD-10-CM | POA: Diagnosis not present

## 2023-12-29 DIAGNOSIS — K50819 Crohn's disease of both small and large intestine with unspecified complications: Secondary | ICD-10-CM | POA: Diagnosis not present

## 2023-12-30 ENCOUNTER — Telehealth: Payer: Self-pay | Admitting: Nurse Practitioner

## 2023-12-30 NOTE — Telephone Encounter (Signed)
 Copied from CRM [id number]. Topic: Clinical - Medical Advice >> Dec 30, 2023 11:01 AM [name] C wrote: Reason for CRM: patient called and requested to speak with Sarah's nurse to discuss if she can be prescribed muscle relaxer's for back, neck, and joint pain, since she has recently had her colon removed. She mentioned that Isa Manuel is already aware of her situation since she saw her back in April and that she has been following up with physical therapy. Please advise at [phone] [facility]) .

## 2023-12-31 ENCOUNTER — Telehealth: Payer: Self-pay | Admitting: Nurse Practitioner

## 2023-12-31 NOTE — Telephone Encounter (Signed)
 Copied from CRM [id number]. Topic: Clinical - Medication Question >> Dec 31, 2023  9:20 AM Alpha Arts wrote: Reason for CRM: patient called and requested to speak with Sarah's nurse to discuss if she can be prescribed muscle relaxer's for back, neck, and joint pain, since she has recently had her colon removed. She mentioned that Isa Manuel is already aware of her situation since she saw her back in April and that she has been following up with physical therapy. Please advise at [phone] [facility])   Preferred Pharmacy: [facility] [place], [place] - [address] [place] [address] Phone: [phone] Fax: [phone] Hours: Not open 24 hours

## 2023-12-31 NOTE — Telephone Encounter (Signed)
 Issue address in different encounter

## 2024-01-01 NOTE — Telephone Encounter (Signed)
 Pt has called for a status update.   She is recovering from colon surgery and has been dealing with back, neck and joint px.   Pt is requesting an rx for muscle relaxers. Forwarding to DOD

## 2024-01-01 NOTE — Telephone Encounter (Signed)
 Copied from CRM [id number]. Topic: Clinical - Medication Question >> Jan 01, 2024  3:46 PM Aisha D wrote: Reason for CRM: : patient called and requested to speak with Sarah's nurse to discuss if she can be prescribed muscle relaxer's for back, neck, and joint pain, since she has recently had her colon removed. She mentioned that Isa Manuel is already aware of her situation since she saw her back in April and that she has been following up with physical therapy. Please advise at [phone] [facility] - [place], [place] - [address] [place] [address]WUXLK: [phone] Fax: [phone]: Not open 24 hours  UPDATE: Pt is calling back to check if there is an update regarding this request. Pt stated that this is the fourth time she has called and has not heard anything back. Pt would like a call back today if possible to speak with a nurse or anyone in the office that can help with her concern.

## 2024-01-02 DIAGNOSIS — Z85828 Personal history of other malignant neoplasm of skin: Secondary | ICD-10-CM | POA: Diagnosis not present

## 2024-01-02 DIAGNOSIS — Z4889 Encounter for other specified surgical aftercare: Secondary | ICD-10-CM | POA: Diagnosis not present

## 2024-01-02 DIAGNOSIS — E039 Hypothyroidism, unspecified: Secondary | ICD-10-CM | POA: Diagnosis not present

## 2024-01-02 DIAGNOSIS — K50819 Crohn's disease of both small and large intestine with unspecified complications: Secondary | ICD-10-CM | POA: Diagnosis not present

## 2024-01-02 DIAGNOSIS — Z8673 Personal history of transient ischemic attack (TIA), and cerebral infarction without residual deficits: Secondary | ICD-10-CM | POA: Diagnosis not present

## 2024-01-02 DIAGNOSIS — I440 Atrioventricular block, first degree: Secondary | ICD-10-CM | POA: Diagnosis not present

## 2024-01-02 DIAGNOSIS — I872 Venous insufficiency (chronic) (peripheral): Secondary | ICD-10-CM | POA: Diagnosis not present

## 2024-01-02 DIAGNOSIS — Z432 Encounter for attention to ileostomy: Secondary | ICD-10-CM | POA: Diagnosis not present

## 2024-01-02 DIAGNOSIS — Z48815 Encounter for surgical aftercare following surgery on the digestive system: Secondary | ICD-10-CM | POA: Diagnosis not present

## 2024-01-02 DIAGNOSIS — K279 Peptic ulcer, site unspecified, unspecified as acute or chronic, without hemorrhage or perforation: Secondary | ICD-10-CM | POA: Diagnosis not present

## 2024-01-06 ENCOUNTER — Ambulatory Visit

## 2024-01-06 DIAGNOSIS — K279 Peptic ulcer, site unspecified, unspecified as acute or chronic, without hemorrhage or perforation: Secondary | ICD-10-CM | POA: Diagnosis not present

## 2024-01-06 DIAGNOSIS — K50819 Crohn's disease of both small and large intestine with unspecified complications: Secondary | ICD-10-CM | POA: Diagnosis not present

## 2024-01-06 DIAGNOSIS — I872 Venous insufficiency (chronic) (peripheral): Secondary | ICD-10-CM | POA: Diagnosis not present

## 2024-01-06 DIAGNOSIS — Z432 Encounter for attention to ileostomy: Secondary | ICD-10-CM | POA: Diagnosis not present

## 2024-01-06 DIAGNOSIS — I440 Atrioventricular block, first degree: Secondary | ICD-10-CM | POA: Diagnosis not present

## 2024-01-06 DIAGNOSIS — Z85828 Personal history of other malignant neoplasm of skin: Secondary | ICD-10-CM | POA: Diagnosis not present

## 2024-01-06 DIAGNOSIS — E039 Hypothyroidism, unspecified: Secondary | ICD-10-CM | POA: Diagnosis not present

## 2024-01-06 DIAGNOSIS — Z4889 Encounter for other specified surgical aftercare: Secondary | ICD-10-CM | POA: Diagnosis not present

## 2024-01-06 DIAGNOSIS — Z48815 Encounter for surgical aftercare following surgery on the digestive system: Secondary | ICD-10-CM | POA: Diagnosis not present

## 2024-01-06 DIAGNOSIS — Z8673 Personal history of transient ischemic attack (TIA), and cerebral infarction without residual deficits: Secondary | ICD-10-CM | POA: Diagnosis not present

## 2024-01-08 DIAGNOSIS — I440 Atrioventricular block, first degree: Secondary | ICD-10-CM | POA: Diagnosis not present

## 2024-01-08 DIAGNOSIS — Z432 Encounter for attention to ileostomy: Secondary | ICD-10-CM | POA: Diagnosis not present

## 2024-01-08 DIAGNOSIS — I872 Venous insufficiency (chronic) (peripheral): Secondary | ICD-10-CM | POA: Diagnosis not present

## 2024-01-08 DIAGNOSIS — Z4889 Encounter for other specified surgical aftercare: Secondary | ICD-10-CM | POA: Diagnosis not present

## 2024-01-08 DIAGNOSIS — Z8673 Personal history of transient ischemic attack (TIA), and cerebral infarction without residual deficits: Secondary | ICD-10-CM | POA: Diagnosis not present

## 2024-01-08 DIAGNOSIS — Z48815 Encounter for surgical aftercare following surgery on the digestive system: Secondary | ICD-10-CM | POA: Diagnosis not present

## 2024-01-08 DIAGNOSIS — K279 Peptic ulcer, site unspecified, unspecified as acute or chronic, without hemorrhage or perforation: Secondary | ICD-10-CM | POA: Diagnosis not present

## 2024-01-08 DIAGNOSIS — E039 Hypothyroidism, unspecified: Secondary | ICD-10-CM | POA: Diagnosis not present

## 2024-01-08 DIAGNOSIS — Z85828 Personal history of other malignant neoplasm of skin: Secondary | ICD-10-CM | POA: Diagnosis not present

## 2024-01-08 DIAGNOSIS — K50819 Crohn's disease of both small and large intestine with unspecified complications: Secondary | ICD-10-CM | POA: Diagnosis not present

## 2024-01-09 DIAGNOSIS — Z85828 Personal history of other malignant neoplasm of skin: Secondary | ICD-10-CM | POA: Diagnosis not present

## 2024-01-09 DIAGNOSIS — E039 Hypothyroidism, unspecified: Secondary | ICD-10-CM | POA: Diagnosis not present

## 2024-01-09 DIAGNOSIS — Z8673 Personal history of transient ischemic attack (TIA), and cerebral infarction without residual deficits: Secondary | ICD-10-CM | POA: Diagnosis not present

## 2024-01-09 DIAGNOSIS — Z48815 Encounter for surgical aftercare following surgery on the digestive system: Secondary | ICD-10-CM | POA: Diagnosis not present

## 2024-01-09 DIAGNOSIS — I872 Venous insufficiency (chronic) (peripheral): Secondary | ICD-10-CM | POA: Diagnosis not present

## 2024-01-09 DIAGNOSIS — K279 Peptic ulcer, site unspecified, unspecified as acute or chronic, without hemorrhage or perforation: Secondary | ICD-10-CM | POA: Diagnosis not present

## 2024-01-09 DIAGNOSIS — Z4889 Encounter for other specified surgical aftercare: Secondary | ICD-10-CM | POA: Diagnosis not present

## 2024-01-09 DIAGNOSIS — I440 Atrioventricular block, first degree: Secondary | ICD-10-CM | POA: Diagnosis not present

## 2024-01-09 DIAGNOSIS — K50819 Crohn's disease of both small and large intestine with unspecified complications: Secondary | ICD-10-CM | POA: Diagnosis not present

## 2024-01-09 DIAGNOSIS — Z432 Encounter for attention to ileostomy: Secondary | ICD-10-CM | POA: Diagnosis not present

## 2024-03-07 NOTE — Progress Notes (Unsigned)
 Cardiology Office Note   Date:  03/09/2024  ID:  Kendra, [name] 05/22/1941, MRN [id number] PCP: Nichole Senior, MD  [facility] Providers Cardiologist:  Stanly DELENA Leavens, MD   History of Present Illness [name] is a 83 y.o. female who has a history of CHF, CAD, SVT, HTN, pulmonary HTN here for follow-up appointment.  She was seen by Dr. Tellis November 2024.  Noted to be volume overloaded.  Concern that volume was related to protein calorie malnutrition and chronic steroid use rather than intrinsic heart failure.  Follow-up proBNP off prednisone  therapy was elevated at 974.  Diuretics were adjusted for volume management.  Heart monitor was arranged for PACs.  This demonstrated asymptomatic supraventricular tachycardia and occasional PACs.  Repeated proBNP 08/19/2023 was even higher at 1021.  However, patient was not having any symptoms of shortness of breath.  Therefore diuretics were not adjusted at that time.  She presented for follow-up January 2025 and she reported a decrease in swelling and edema in her feet and legs since the adjustment with no significant shortness of breath.  Denies chest pain/pressure or tightness.  Reported no difficulty in breathing during her daily walks which she undertakes 5 times a day for 25 minutes.  Time.  Patient also has history of Crohn's disease which is currently not well-controlled.  This leads to frequent Remicade infusions every 4 weeks and a recent return of prednisone  therapy following a flare.  Patient expressed hope that Remicade will eventually allow her to discontinue prednisone .  In addition to her heart and Crohn's disease patient reported issues with incontinence, particularly at night and has had appointments with urology to address this.  Today, she presents for cardiovascular follow-up and discussion of palpitations.  She reports no current palpitations, fast heartbeats, or skipped heartbeats. She denies  shortness of breath, indigestion, or heartburn. She takes metoprolol  as a half pill once a day. She experienced edema and swelling while using a wheelchair, which resolved after she began walking regularly. She has done really well with PT and worked hard to increase her mobility.  Reports no shortness of breath nor dyspnea on exertion. Reports no chest pain, pressure, or tightness. No edema, orthopnea, PND. Reports no palpitations.   Discussed the use of AI scribe software for clinical note transcription with the patient, who gave verbal consent to proceed.   ROS: pertinent ROS in HPI   Studies Reviewed     Echo 11/25/23 IMPRESSIONS     1. Left ventricular ejection fraction, by estimation, is 60 to 65%. The  left ventricle has normal function. The left ventricle has no regional  wall motion abnormalities. Left ventricular diastolic parameters are  consistent with Grade II diastolic  dysfunction (pseudonormalization).   2. Right ventricular systolic function is normal. The right ventricular  size is normal. There is mildly elevated pulmonary artery systolic  pressure. The estimated right ventricular systolic pressure is 39.7 mmHg.   3. Left atrial size was severely dilated.   4. The mitral valve is normal in structure. Mild mitral valve  regurgitation. No evidence of mitral stenosis.   5. The aortic valve is normal in structure. Aortic valve regurgitation is  trivial. Aortic valve sclerosis/calcification is present, without any  evidence of aortic stenosis.   6. The inferior vena cava is normal in size with greater than 50%  respiratory variability, suggesting right atrial pressure of 3 mmHg.   FINDINGS   Left Ventricle: Left ventricular ejection fraction, by estimation, is  60  to 65%. The left ventricle has normal function. The left ventricle has no  regional wall motion abnormalities. 3D ejection fraction reviewed and  evaluated as part of the  interpretation. Alternate  measurement of EF is felt to be most reflective  of LV function. The left ventricular internal cavity size was normal in  size. There is no left ventricular hypertrophy. Left ventricular diastolic  parameters are consistent with  Grade II diastolic dysfunction (pseudonormalization). Normal left  ventricular filling pressure.   Right Ventricle: The right ventricular size is normal. No increase in  right ventricular wall thickness. Right ventricular systolic function is  normal. There is mildly elevated pulmonary artery systolic pressure. The  tricuspid regurgitant velocity is 3.03   m/s, and with an assumed right atrial pressure of 3 mmHg, the estimated  right ventricular systolic pressure is 39.7 mmHg.   Left Atrium: Left atrial size was severely dilated.   Right Atrium: Right atrial size was normal in size.   Pericardium: There is no evidence of pericardial effusion.   Mitral Valve: The mitral valve is normal in structure. Mild mitral valve  regurgitation. No evidence of mitral valve stenosis.   Tricuspid Valve: The tricuspid valve is normal in structure. Tricuspid  valve regurgitation is mild . No evidence of tricuspid stenosis.   Aortic Valve: The aortic valve is normal in structure. Aortic valve  regurgitation is trivial. Aortic valve sclerosis/calcification is present,  without any evidence of aortic stenosis.   Pulmonic Valve: The pulmonic valve was normal in structure. Pulmonic valve  regurgitation is trivial. No evidence of pulmonic stenosis.   Aorta: The aortic root is normal in size and structure.   Venous: The inferior vena cava is normal in size with greater than 50%  respiratory variability, suggesting right atrial pressure of 3 mmHg.   IAS/Shunts: No atrial level shunt detected by color flow Doppler.   Additional Comments: 3D was performed not requiring image post processing  on an independent workstation and was normal.   Physical Exam VS:  BP 120/68 (BP  Location: Left Arm, Patient Position: Sitting, Cuff Size: Normal)   Pulse 70   Ht 5' 1 (1.549 m)   Wt 131 lb 6.4 oz (59.6 kg)   SpO2 98%   BMI 24.83 kg/m        Wt Readings from Last 3 Encounters:  03/09/24 131 lb 6.4 oz (59.6 kg)  11/13/23 139 lb 6 oz (63.2 kg)  09/23/23 129 lb (58.5 kg)    GEN: Well nourished, well developed in no acute distress NECK: No JVD; No carotid bruits CARDIAC: RRR, no murmurs, rubs, gallops RESPIRATORY:  Clear to auscultation without rales, wheezing or rhonchi  ABDOMEN: Soft, non-tender, non-distended EXTREMITIES:  No edema; No deformity   ASSESSMENT AND PLAN  Hypertension Hypertension controlled with metoprolol . No cardiovascular symptoms present. - Continue metoprolol  25 mg daily. - Refill metoprolol  prescription.  Hyperlipidemia Hyperlipidemia controlled with Crestor . LDL at 54. - Continue Crestor . - Refill Crestor  prescription.  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 Improved quality of life and effective stoma management.   Chronic heart failure with preserved ejection fraction -euvolemic on exam -continue current medication treatment  Coronary artery disease -no chest pain -continue current medications  SVT -no palpitations -continue metoprolol        Dispo: She can follow-up in 6 months  Signed, Orren LOISE Fabry, PA-C

## 2024-03-09 ENCOUNTER — Encounter: Payer: Self-pay | Admitting: Physician Assistant

## 2024-03-09 ENCOUNTER — Ambulatory Visit: Attending: Physician Assistant | Admitting: Physician Assistant

## 2024-03-09 VITALS — BP 120/68 | HR 70 | Ht 61.0 in | Wt 131.4 lb

## 2024-03-09 DIAGNOSIS — I2510 Atherosclerotic heart disease of native coronary artery without angina pectoris: Secondary | ICD-10-CM

## 2024-03-09 DIAGNOSIS — I471 Supraventricular tachycardia, unspecified: Secondary | ICD-10-CM | POA: Diagnosis not present

## 2024-03-09 DIAGNOSIS — I5032 Chronic diastolic (congestive) heart failure: Secondary | ICD-10-CM

## 2024-03-09 DIAGNOSIS — I10 Essential (primary) hypertension: Secondary | ICD-10-CM

## 2024-03-09 DIAGNOSIS — I272 Pulmonary hypertension, unspecified: Secondary | ICD-10-CM | POA: Diagnosis not present

## 2024-03-09 MED ORDER — ROSUVASTATIN CALCIUM 10 MG PO TABS
10.0000 mg | ORAL_TABLET | Freq: Every evening | ORAL | 3 refills | Status: AC
Start: 2024-03-09 — End: ?

## 2024-03-09 MED ORDER — FUROSEMIDE 20 MG PO TABS: 20.0000 mg | ORAL_TABLET | Freq: Every day | ORAL | 2 refills | Status: AC

## 2024-03-09 MED ORDER — METOPROLOL SUCCINATE ER 25 MG PO TB24: 12.5000 mg | ORAL_TABLET | Freq: Every day | ORAL | 3 refills | Status: AC

## 2024-03-09 NOTE — Patient Instructions (Signed)
 Medication Instructions:  Refills sent to your pharmacy   Follow-Up:  Your next appointment:   6 month(s)  Provider:   Stanly DELENA Leavens, MD

## 2024-07-21 ENCOUNTER — Encounter: Payer: Self-pay | Admitting: Internal Medicine
